# Patient Record
Sex: Female | Born: 1950 | Race: White | Hispanic: No | Marital: Single | Smoking: Never smoker
Health system: Southern US, Community
[De-identification: ages and names within clinical notes are randomized; demographics above are authoritative.]

## PROBLEM LIST (undated history)

## (undated) DIAGNOSIS — I4891 Unspecified atrial fibrillation: Secondary | ICD-10-CM

## (undated) DIAGNOSIS — M1990 Unspecified osteoarthritis, unspecified site: Secondary | ICD-10-CM

## (undated) DIAGNOSIS — R011 Cardiac murmur, unspecified: Secondary | ICD-10-CM

## (undated) DIAGNOSIS — K219 Gastro-esophageal reflux disease without esophagitis: Secondary | ICD-10-CM

## (undated) DIAGNOSIS — I10 Essential (primary) hypertension: Secondary | ICD-10-CM

## (undated) DIAGNOSIS — M7989 Other specified soft tissue disorders: Secondary | ICD-10-CM

## (undated) DIAGNOSIS — F419 Anxiety disorder, unspecified: Secondary | ICD-10-CM

## (undated) DIAGNOSIS — F32A Depression, unspecified: Secondary | ICD-10-CM

## (undated) DIAGNOSIS — F329 Major depressive disorder, single episode, unspecified: Secondary | ICD-10-CM

## (undated) DIAGNOSIS — E875 Hyperkalemia: Secondary | ICD-10-CM

## (undated) DIAGNOSIS — R112 Nausea with vomiting, unspecified: Secondary | ICD-10-CM

## (undated) DIAGNOSIS — N289 Disorder of kidney and ureter, unspecified: Secondary | ICD-10-CM

## (undated) DIAGNOSIS — C541 Malignant neoplasm of endometrium: Secondary | ICD-10-CM

## (undated) DIAGNOSIS — Z8719 Personal history of other diseases of the digestive system: Secondary | ICD-10-CM

## (undated) DIAGNOSIS — C9110 Chronic lymphocytic leukemia of B-cell type not having achieved remission: Secondary | ICD-10-CM

## (undated) DIAGNOSIS — M7730 Calcaneal spur, unspecified foot: Secondary | ICD-10-CM

## (undated) DIAGNOSIS — Z9889 Other specified postprocedural states: Secondary | ICD-10-CM

## (undated) DIAGNOSIS — E785 Hyperlipidemia, unspecified: Secondary | ICD-10-CM

## (undated) DIAGNOSIS — S92902A Unspecified fracture of left foot, initial encounter for closed fracture: Secondary | ICD-10-CM

## (undated) DIAGNOSIS — Z86007 Personal history of in-situ neoplasm of skin: Secondary | ICD-10-CM

## (undated) HISTORY — DX: Cardiac murmur, unspecified: R01.1

## (undated) HISTORY — DX: Personal history of in-situ neoplasm of skin: Z86.007

## (undated) HISTORY — DX: Essential (primary) hypertension: I10

## (undated) HISTORY — DX: Gastro-esophageal reflux disease without esophagitis: K21.9

## (undated) HISTORY — DX: Calcaneal spur, unspecified foot: M77.30

## (undated) HISTORY — PX: OTHER SURGICAL HISTORY: SHX169

## (undated) HISTORY — DX: Major depressive disorder, single episode, unspecified: F32.9

## (undated) HISTORY — PX: HIP SURGERY: SHX245

## (undated) HISTORY — DX: Chronic lymphocytic leukemia of B-cell type not having achieved remission: C91.10

## (undated) HISTORY — DX: Unspecified atrial fibrillation: I48.91

## (undated) HISTORY — DX: Depression, unspecified: F32.A

## (undated) HISTORY — DX: Hyperlipidemia, unspecified: E78.5

## (undated) HISTORY — PX: EYE SURGERY: SHX253

## (undated) HISTORY — DX: Malignant neoplasm of endometrium: C54.1

## (undated) HISTORY — PX: LYMPH NODE BIOPSY: SHX201

## (undated) HISTORY — PX: COLONOSCOPY: SHX174

## (undated) HISTORY — PX: REFRACTIVE SURGERY: SHX103

---

## 1996-11-01 HISTORY — PX: ABDOMINAL HYSTERECTOMY: SHX81

## 1998-03-14 ENCOUNTER — Emergency Department (HOSPITAL_COMMUNITY): Admission: EM | Admit: 1998-03-14 | Discharge: 1998-03-14 | Payer: Self-pay | Admitting: Emergency Medicine

## 1999-02-02 ENCOUNTER — Ambulatory Visit (HOSPITAL_COMMUNITY): Admission: RE | Admit: 1999-02-02 | Discharge: 1999-02-02 | Payer: Self-pay | Admitting: Internal Medicine

## 1999-02-04 ENCOUNTER — Encounter: Admission: RE | Admit: 1999-02-04 | Discharge: 1999-02-04 | Payer: Self-pay | Admitting: Internal Medicine

## 1999-03-11 ENCOUNTER — Encounter: Admission: RE | Admit: 1999-03-11 | Discharge: 1999-03-11 | Payer: Self-pay | Admitting: Internal Medicine

## 1999-09-28 ENCOUNTER — Ambulatory Visit (HOSPITAL_COMMUNITY): Admission: RE | Admit: 1999-09-28 | Discharge: 1999-09-28 | Payer: Self-pay | Admitting: Gastroenterology

## 1999-12-22 ENCOUNTER — Encounter: Payer: Self-pay | Admitting: Obstetrics and Gynecology

## 1999-12-22 ENCOUNTER — Encounter: Admission: RE | Admit: 1999-12-22 | Discharge: 1999-12-22 | Payer: Self-pay | Admitting: Obstetrics and Gynecology

## 2000-05-16 ENCOUNTER — Encounter: Admission: RE | Admit: 2000-05-16 | Discharge: 2000-05-16 | Payer: Self-pay | Admitting: Internal Medicine

## 2000-11-14 ENCOUNTER — Encounter: Admission: RE | Admit: 2000-11-14 | Discharge: 2000-11-14 | Payer: Self-pay | Admitting: Internal Medicine

## 2000-12-23 ENCOUNTER — Encounter: Payer: Self-pay | Admitting: Obstetrics and Gynecology

## 2000-12-23 ENCOUNTER — Encounter: Admission: RE | Admit: 2000-12-23 | Discharge: 2000-12-23 | Payer: Self-pay | Admitting: Obstetrics and Gynecology

## 2001-05-15 ENCOUNTER — Encounter: Admission: RE | Admit: 2001-05-15 | Discharge: 2001-05-15 | Payer: Self-pay | Admitting: Internal Medicine

## 2002-01-15 ENCOUNTER — Encounter: Admission: RE | Admit: 2002-01-15 | Discharge: 2002-01-15 | Payer: Self-pay | Admitting: Obstetrics and Gynecology

## 2002-01-15 ENCOUNTER — Encounter: Payer: Self-pay | Admitting: Obstetrics and Gynecology

## 2003-01-28 ENCOUNTER — Encounter: Payer: Self-pay | Admitting: Obstetrics and Gynecology

## 2003-01-28 ENCOUNTER — Encounter: Admission: RE | Admit: 2003-01-28 | Discharge: 2003-01-28 | Payer: Self-pay | Admitting: Obstetrics and Gynecology

## 2003-03-05 ENCOUNTER — Encounter: Admission: RE | Admit: 2003-03-05 | Discharge: 2003-03-05 | Payer: Self-pay | Admitting: *Deleted

## 2004-01-31 ENCOUNTER — Encounter: Admission: RE | Admit: 2004-01-31 | Discharge: 2004-01-31 | Payer: Self-pay | Admitting: Obstetrics and Gynecology

## 2005-02-17 ENCOUNTER — Encounter: Admission: RE | Admit: 2005-02-17 | Discharge: 2005-02-17 | Payer: Self-pay | Admitting: Obstetrics and Gynecology

## 2006-02-21 ENCOUNTER — Encounter: Admission: RE | Admit: 2006-02-21 | Discharge: 2006-02-21 | Payer: Self-pay | Admitting: Family Medicine

## 2007-03-16 ENCOUNTER — Encounter: Admission: RE | Admit: 2007-03-16 | Discharge: 2007-03-16 | Payer: Self-pay | Admitting: Family Medicine

## 2008-04-05 ENCOUNTER — Encounter: Admission: RE | Admit: 2008-04-05 | Discharge: 2008-04-05 | Payer: Self-pay | Admitting: Family Medicine

## 2009-04-21 ENCOUNTER — Encounter: Admission: RE | Admit: 2009-04-21 | Discharge: 2009-04-21 | Payer: Self-pay | Admitting: Family Medicine

## 2009-07-21 ENCOUNTER — Encounter: Admission: RE | Admit: 2009-07-21 | Discharge: 2009-07-21 | Payer: Self-pay | Admitting: Family Medicine

## 2009-08-09 ENCOUNTER — Ambulatory Visit (HOSPITAL_COMMUNITY): Admission: RE | Admit: 2009-08-09 | Discharge: 2009-08-09 | Payer: Self-pay | Admitting: Orthopedic Surgery

## 2009-08-27 ENCOUNTER — Emergency Department (HOSPITAL_COMMUNITY): Admission: EM | Admit: 2009-08-27 | Discharge: 2009-08-27 | Payer: Self-pay | Admitting: Family Medicine

## 2009-09-01 ENCOUNTER — Ambulatory Visit (HOSPITAL_COMMUNITY): Admission: RE | Admit: 2009-09-01 | Discharge: 2009-09-01 | Payer: Self-pay | Admitting: Orthopedic Surgery

## 2009-10-15 ENCOUNTER — Encounter (INDEPENDENT_AMBULATORY_CARE_PROVIDER_SITE_OTHER): Payer: Self-pay | Admitting: *Deleted

## 2009-10-28 ENCOUNTER — Encounter (INDEPENDENT_AMBULATORY_CARE_PROVIDER_SITE_OTHER): Payer: Self-pay | Admitting: *Deleted

## 2009-10-29 ENCOUNTER — Ambulatory Visit: Payer: Self-pay | Admitting: Gastroenterology

## 2009-10-29 ENCOUNTER — Encounter (INDEPENDENT_AMBULATORY_CARE_PROVIDER_SITE_OTHER): Payer: Self-pay | Admitting: *Deleted

## 2009-11-21 ENCOUNTER — Encounter: Admission: RE | Admit: 2009-11-21 | Discharge: 2009-11-21 | Payer: Self-pay | Admitting: Family Medicine

## 2009-11-25 ENCOUNTER — Ambulatory Visit: Payer: Self-pay | Admitting: Gastroenterology

## 2010-04-09 LAB — HM MAMMOGRAPHY

## 2010-04-23 ENCOUNTER — Encounter: Admission: RE | Admit: 2010-04-23 | Discharge: 2010-04-23 | Payer: Self-pay | Admitting: Family Medicine

## 2010-10-05 ENCOUNTER — Encounter: Payer: Self-pay | Admitting: Family Medicine

## 2010-10-05 LAB — CONVERTED CEMR LAB: HDL: 48 mg/dL

## 2010-10-29 ENCOUNTER — Encounter: Payer: Self-pay | Admitting: Family Medicine

## 2010-11-06 ENCOUNTER — Ambulatory Visit
Admission: RE | Admit: 2010-11-06 | Discharge: 2010-11-06 | Payer: Self-pay | Source: Home / Self Care | Attending: Family Medicine | Admitting: Family Medicine

## 2010-11-06 DIAGNOSIS — I10 Essential (primary) hypertension: Secondary | ICD-10-CM | POA: Insufficient documentation

## 2010-11-06 DIAGNOSIS — E782 Mixed hyperlipidemia: Secondary | ICD-10-CM | POA: Insufficient documentation

## 2010-11-06 DIAGNOSIS — F418 Other specified anxiety disorders: Secondary | ICD-10-CM | POA: Insufficient documentation

## 2010-11-06 DIAGNOSIS — E1169 Type 2 diabetes mellitus with other specified complication: Secondary | ICD-10-CM | POA: Insufficient documentation

## 2010-11-06 DIAGNOSIS — F329 Major depressive disorder, single episode, unspecified: Secondary | ICD-10-CM | POA: Insufficient documentation

## 2010-11-06 DIAGNOSIS — K219 Gastro-esophageal reflux disease without esophagitis: Secondary | ICD-10-CM | POA: Insufficient documentation

## 2010-12-03 NOTE — Procedures (Signed)
Summary: Colonoscopy  Patient: Brenda [name] Note: All result statuses are Final unless otherwise noted.  Tests: (1) Colonoscopy (COL)   COL Colonoscopy           [facility].     [place],   [address]           COLONOSCOPY PROCEDURE REPORT           PATIENT:  Brenda [name], Service  MR#:  [id number]     BIRTHDATE:  1951/03/25, 58 yrs. old  GENDER:  female           ENDOSCOPIST:  Sandy Salaam. Deatra Ina, MD     Referred by:           PROCEDURE DATE:  11/25/2009     PROCEDURE:  Colonoscopy, Diagnostic     ASA CLASS:  Class I     INDICATIONS:  Routine Risk Screening           MEDICATIONS:   Fentanyl 50 mcg IV, Versed 7 mg IV           DESCRIPTION OF PROCEDURE:   After the risks benefits and     alternatives of the procedure were thoroughly explained, informed     consent was obtained.  Digital rectal exam was performed and     revealed no abnormalities.   The LB [id number] endoscope     was introduced through the anus and advanced to the cecum, which     was identified by both the appendix and ileocecal valve, without     limitations.  The quality of the prep was excellent, using     MoviPrep.  The instrument was then slowly withdrawn as the colon     was fully examined.     <<PROCEDUREIMAGES>>           FINDINGS:  A normal appearing cecum, ileocecal valve, and     appendiceal orifice were identified. The ascending, hepatic     flexure, transverse, splenic flexure, descending, sigmoid colon,     and rectum appeared unremarkable (see image1, image2, image3,     image5, image6, image7, image9, image10, image12, and image13).     Retroflexed views in the rectum revealed no abnormalities.    The     scope was then withdrawn from the patient and the procedure     completed.           COMPLICATIONS:  None           ENDOSCOPIC IMPRESSION:     1) Normal colon     RECOMMENDATIONS:     1) Continue current colorectal screening recommendations  for     "routine risk" patients with a repeat colonoscopy in 10 years.           REPEAT EXAM:  In 10 year(s) for Colonoscopy.           ______________________________     Sandy Salaam. Deatra Ina, MD           CC:  Esperanza Richters, MD           n.     Lorrin Mais:   Sandy Salaam. Kaplan at 11/25/2009 09:04 AM           Ardine Eng, [id number]  Note: An exclamation mark (!) indicates a result that was not dispersed into the flowsheet. Document Creation Date: 11/25/2009 9:04 AM _______________________________________________________________________  (1) Order result status: Final Collection or  observation date-time: 11/25/2009 08:58 Requested date-time:  Receipt date-time:  Reported date-time:  Referring Physician:   Ordering Physician: Erskine Emery [phone]) Specimen Source:  Source: Brenda [name] Order Number: [id number] Lab site:   Appended Document: Colonoscopy    Clinical Lists Changes  Observations: Added new observation of COLONNXTDUE: 11/2019 (11/25/2009 12:53)

## 2010-12-03 NOTE — Assessment & Plan Note (Signed)
Summary: new patient (new patient packet mailed)/alc   Vital Signs:  Patient profile:   60 year old female Height:      61 inches Weight:      202.75 pounds BMI:     38.45 Temp:     98.4 degrees F oral Pulse rate:   90 / minute Pulse rhythm:   regular BP sitting:   110 / 72  (right arm) Cuff size:   large  Vitals Entered By: Sherrian Divers CMA Deborra Medina) (November 06, 2010 2:06 PM) CC: new patient, establish care   History of Present Illness: 60 [name] here to establish care. Pt of Dr. Tawni Millers, practice is closing.  DM- diagnosed in 2008, on Janumet 50/1000- 1 tab by mouth two times a day and Glipizide 2.5 mg daily.  Checks CBGs two times a day, 116-180.  Last a1c was 6.2.  Does have occassional episodes of hypoglycemia when she does not eat for long periods of time.  H/o endometrial CA in 1998- s/p hysterectomy.  No further tx necessary.  Depression- has been on Zoloft 200 mg for years. Admits to feeling more depressed lately- more anhedonia, more tearful.   Brother is her last living relative and he has been having health problems.  No SI or HI. Never been on any other antidepressants.  Preventive Screening-Counseling & Management  Alcohol-Tobacco     Smoking Status: never  Current Medications (verified): 1)  Clonazepam 0.5 Mg Tabs (Clonazepam) .... Take One Tablet By Mouth Three Times A Day 2)  Amlodipine Besy-Benazepril Hcl 10-20 Mg Caps (Amlodipine Besy-Benazepril Hcl) .... Take One Tablet By Mouth Daily 3)  Sertraline Hcl 100 Mg Tabs (Sertraline Hcl) .Marland Kitchen.. 1 Tab By Mouth Daily. 4)  Janumet 50-1000 Mg Tabs (Sitagliptin-Metformin Hcl) .... Take One Tablet By Mouth Two Times A Day 5)  Vytorin 10-40 Mg Tabs (Ezetimibe-Simvastatin) .... Take One Tablet By Mouth Daily 6)  Tramadol Hcl 50 Mg Tabs (Tramadol Hcl) .... Take One Tablet By Mouth As Needed 7)  Lovaza 1 Gm Caps (Omega-3-Acid Ethyl Esters) .... Take Two Tablets By Mouth Two Times A Day 8)  Prevacid 15 Mg Cpdr (Lansoprazole)  .... Take One Tablet By Mouth Daily 9)  Calcium 500 Mg Tabs (Calcium) .... Take One Tablet By Mouth Two Times A Day 10)  Vitamin D 1000 Unit Tabs (Cholecalciferol) .... Take One Tablet By Mouth Daily 11)  Aspirin 81 Mg Tabs (Aspirin) .... Take One Tablet By Mouth Daily 12)  Glipizide 5 Mg Tabs (Glipizide) .... Take 1/2 Tablet Two Times A Day 13)  Hydrochlorothiazide 25 Mg Tabs (Hydrochlorothiazide) .... Take One Tablet By Mouth Daily 14)  Meloxicam 15 Mg Tabs (Meloxicam) .... Take 1/2 To One Tablet Once Daily As Needed 15)  Wellbutrin Xl 150 Mg Xr24h-Tab (Bupropion Hcl) .Marland Kitchen.. 1 Tab By Mouth Every Morning.  Allergies: 1)  ! 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 Beola Cord Heart murmur, benign- Dr. Rockey Situ.  Per pt, neg stress test and echo in 2007  Past Surgical History: Hysterectomy  Family History: Family History Breast cancer 1st degree relative <50,  mom died of breast CA at 62 [name] Dad died in his 62s- plasma sarcoma  Social History: Single Therapist, sports at [employer] Never Smoked Alcohol use-no Smoking Status:  never  Review of Systems      See HPI General:  Denies loss of appetite. Eyes:  Denies blurring. ENT:  Denies difficulty swallowing. CV:  Denies chest pain or discomfort. Resp:  Denies shortness of breath. GI:  Denies abdominal pain, nausea, and vomiting. GU:  Denies discharge and dysuria. MS:  Complains of joint pain; denies joint redness and joint swelling. Derm:  Denies rash. Neuro:  Denies headaches. Psych:   Complains of depression and easily tearful; denies suicidal thoughts/plans, thoughts of violence, unusual visions or sounds, and thoughts /plans of harming others. Endo:  Denies cold intolerance and heat intolerance. Heme:  Denies abnormal bruising and bleeding.  Physical Exam  General:  alert and overweight-appearing.   Head:  normocephalic and no abnormalities observed.   Eyes:  vision grossly intact, pupils equal, and pupils round.   Ears:  R ear normal and L ear normal.   Nose:  no external deformity.   Mouth:  good dentition and no gingival abnormalities.   Neck:  No deformities, masses, or tenderness noted. Lungs:  Normal respiratory effort, chest expands symmetrically. Lungs are clear to auscultation, no crackles or wheezes. Heart:  normal rate and grade  2/6 systolic murmur.   Abdomen:  Bowel sounds positive,abdomen soft and non-tender without masses, organomegaly or hernias noted. Msk:  No deformity or scoliosis noted of thoracic or lumbar spine.   Extremities:  no edema Neurologic:  alert & oriented X3 and gait normal.   Skin:  Intact without suspicious lesions or rashes Psych:  Cognition and judgment appear intact. Alert and cooperative with normal attention span and concentration. No apparent delusions, illusions, hallucinations   Impression & Recommendations:  Problem # 1:  DEPRESSION (ICD-311) Assessment Deteriorated Discussed different tx options with pt. We agreed to decrease her Zoloft to 100 mg daily and add Wellbutrin 150 mg daily.  Pt to follow up in one month.   Her updated medication list for this problem includes:    Clonazepam 0.5 Mg Tabs (Clonazepam) .Marland Kitchen... Take one tablet by mouth three times a day    Sertraline Hcl 100 Mg Tabs (Sertraline hcl) .Marland Kitchen... 1 tab by mouth daily.    Wellbutrin Xl 150 Mg Xr24h-tab (Bupropion hcl) .Marland Kitchen... 1 tab by mouth every morning.  Problem # 2:  HYPERTENSION (ICD-401.9) Assessment: Unchanged Stable on current meds, awaiting  records.   Her updated medication list for this problem includes:    Amlodipine Besy-benazepril Hcl 10-20 Mg Caps (Amlodipine besy-benazepril hcl) .Marland Kitchen... Take one tablet by mouth daily    Hydrochlorothiazide 25 Mg Tabs (Hydrochlorothiazide) .Marland Kitchen... Take one tablet by mouth daily  Problem # 3:  DIABETES MELLITUS, TYPE II (ICD-250.00) Assessment: Unchanged Awaiting records.  May be able to decrease meds.   Her updated medication list for this problem includes:    Amlodipine Besy-benazepril Hcl 10-20 Mg Caps (Amlodipine besy-benazepril hcl) .Marland Kitchen... Take one tablet by mouth daily    Janumet 50-1000 Mg Tabs (Sitagliptin-metformin hcl) .Marland Kitchen... Take one tablet by mouth two times a day    Aspirin 81 Mg Tabs (Aspirin) .Marland Kitchen... Take one tablet by mouth daily    Glipizide 5 Mg Tabs (Glipizide) .Marland Kitchen... Take 1/2 tablet two times a day  Problem # 4:  HYPERLIPIDEMIA (ICD-272.4) Assessment: Unchanged awaiting records.  denies any side effects from meds.  Vytorin 10-40 Mg Tabs (Ezetimibe-simvastatin) .Marland Kitchen... Take one tablet by mouth daily    Lovaza 1 Gm Caps (Omega-3-acid ethyl esters) .Marland Kitchen... Take two tablets by mouth two times a day  Complete Medication List: 1)  Clonazepam 0.5 Mg Tabs (Clonazepam) .... Take one tablet by mouth three times a day 2)  Amlodipine Besy-benazepril Hcl 10-20 Mg Caps (Amlodipine besy-benazepril hcl) .... Take one tablet by mouth daily 3)  Sertraline Hcl 100 Mg Tabs (Sertraline hcl) .Marland Kitchen.. 1 tab by mouth daily. 4)  Janumet 50-1000 Mg Tabs (Sitagliptin-metformin hcl) .... Take one tablet by mouth two times a day 5)  Vytorin 10-40 Mg Tabs (Ezetimibe-simvastatin) .... Take one tablet by mouth daily 6)  Tramadol Hcl 50 Mg Tabs (Tramadol hcl) .... Take one tablet by mouth as needed 7)  Lovaza 1 Gm Caps (Omega-3-acid ethyl esters) .... Take two tablets by mouth two times a day 8)  Prevacid 15 Mg Cpdr (Lansoprazole) .... Take one tablet by mouth daily 9)  Calcium 500 Mg Tabs (Calcium) .... Take  one tablet by mouth two times a day 10)  Vitamin D 1000 Unit Tabs (Cholecalciferol) .... Take one tablet by mouth daily 11)  Aspirin 81 Mg Tabs (Aspirin) .... Take one tablet by mouth daily 12)  Glipizide 5 Mg Tabs (Glipizide) .... Take 1/2 tablet two times a day 13)  Hydrochlorothiazide 25 Mg Tabs (Hydrochlorothiazide) .... Take one tablet by mouth daily 14)  Meloxicam 15 Mg Tabs (Meloxicam) .... Take 1/2 to one tablet once daily as needed 15)  Wellbutrin Xl 150 Mg Xr24h-tab (Bupropion hcl) .Marland Kitchen.. 1 tab by mouth every morning.  Patient Instructions: 1)  Great to meet you. 2)  Let's add Wellbutrin 150 mg every morning to your Zoloft (decrease the Zoloft to 1 tab by mouth daily). 3)  Make an appointment for a complete physical in one month. Prescriptions: SERTRALINE HCL 100 MG TABS (SERTRALINE HCL) 1 tab by mouth daily.  #30 x 1   Entered and Authorized by:   Arnette Norris MD   Signed by:   Arnette Norris MD on 11/06/2010   Method used:   Electronically to        [facility] (retail)       [address], [place]  [address]       Ph: [phone]       Fax: [phone]   RxID:   [id number] WELLBUTRIN XL 150 MG XR24H-TAB (BUPROPION HCL) 1 tab by mouth every morning.  #30 x 0   Entered and Authorized by:   Arnette Norris MD   Signed by:   Arnette Norris MD on 11/06/2010   Method used:   Electronically to        [facility] (retail)       [address]       [place], [place]  [address]       Ph: [phone]       Fax: [phone]   RxID:   [id number]    Orders Added: 1)  New Patient Level III [18563]    Current Allergies (reviewed today): ! BETA BLOCKERS  TD Result Date:  11/12/2009 TD Result:  historical Flex Sig Next Due:  Not Indicated Hemoccult Next Due:  Not Indicated PAP Result Date:  10/15/2009 PAP Result:  historical normal Mammogram Result  Date:  04/09/2010 Mammogram Result:  historical normal

## 2010-12-07 ENCOUNTER — Telehealth: Payer: Self-pay | Admitting: Family Medicine

## 2010-12-14 ENCOUNTER — Encounter: Payer: Self-pay | Admitting: Family Medicine

## 2010-12-14 ENCOUNTER — Encounter (INDEPENDENT_AMBULATORY_CARE_PROVIDER_SITE_OTHER): Payer: Commercial Managed Care - PPO | Admitting: Family Medicine

## 2010-12-14 DIAGNOSIS — E119 Type 2 diabetes mellitus without complications: Secondary | ICD-10-CM

## 2010-12-14 DIAGNOSIS — Z23 Encounter for immunization: Secondary | ICD-10-CM

## 2010-12-14 DIAGNOSIS — Z0000 Encounter for general adult medical examination without abnormal findings: Secondary | ICD-10-CM

## 2010-12-14 DIAGNOSIS — F329 Major depressive disorder, single episode, unspecified: Secondary | ICD-10-CM

## 2010-12-14 DIAGNOSIS — E785 Hyperlipidemia, unspecified: Secondary | ICD-10-CM

## 2010-12-17 NOTE — Progress Notes (Signed)
Summary: Bupropion XL  Phone Note Refill Request Message from:  Fax from Pharmacy on December 07, 2010 12:09 PM  Refills Requested: Medication #1:  WELLBUTRIN XL 150 MG XR24H-TAB 1 tab by mouth every morning.. This was only prescribed for #30 /0RF at NP visit in January, not sure if you wanted it RF'd.  [facility]   Method Requested: Electronic Initial call taken by: Christena Deem CMA Deborra Medina),  December 07, 2010 12:10 PM    Prescriptions: WELLBUTRIN XL 150 MG XR24H-TAB (BUPROPION HCL) 1 tab by mouth every morning.  #30 x 3   Entered and Authorized by:   Arnette Norris MD   Signed by:   Arnette Norris MD on 12/07/2010   Method used:   Electronically to        [facility] (retail)       [address], [place]  [address]       Ph: [phone]       Fax: [phone]   RxID:   [id number]

## 2010-12-23 NOTE — Assessment & Plan Note (Signed)
Summary: cpe [facility]   Vital Signs:  Patient profile:   60 year old female Height:      61 inches Weight:      209 pounds BMI:     39.63 Temp:     98.4 degrees F oral Pulse rate:   86 / minute Pulse rhythm:   regular BP sitting:   122 / 72  (left arm) Cuff size:   large  Vitals Entered By: Sherrian Divers CMA Deborra Medina) (December 14, 2010 8:26 AM) CC: complete physicial no pap   History of Present Illness: 60 yo here for CPX.     DM- diagnosed in 2008, on Janumet 50/1000- 1 tab by mouth two times a day and Glipizide 2.5 mg daily.  Checks CBGs two times a day, 116-180.  Last a1c was 6.1 in December.  Does have occassional episodes of hypoglycemia when she does not eat for long periods of time.  Well woman H/o endometrial CA in 1998- s/p hysterectomy/oophrectomy.  No further tx necessary. UTD on colonoscopy, mammogram. Due for Zostavax.  Depression- has been on Zoloft 200 mg for years. Last montoh, admitted to feeling more depressed lately- more anhedonia, more tearful.   Brother is her last living relative and he has been having health problems.  No SI or HI.  We added Wellbutrin 150 mg XL daily last month and decreased her Zoloft to 100 mg daily. She does feel the Wellbutrin has helped with andhedonia, tearfulness. Would like to try to increase dose if possible.     Current Medications (verified): 1)  Clonazepam 0.5 Mg Tabs (Clonazepam) .... Take One Tablet By Mouth Three Times A Day 2)  Amlodipine Besy-Benazepril Hcl 10-20 Mg Caps (Amlodipine Besy-Benazepril Hcl) .... Take One Tablet By Mouth Daily 3)  Sertraline Hcl 100 Mg Tabs (Sertraline Hcl) .Marland Kitchen.. 1 Tab By Mouth Daily. 4)  Janumet 50-1000 Mg Tabs (Sitagliptin-Metformin Hcl) .... Take One Tablet By Mouth Two Times A Day 5)  Vytorin 10-40 Mg Tabs (Ezetimibe-Simvastatin) .... Take One Tablet By Mouth Daily 6)  Tramadol Hcl 50 Mg Tabs (Tramadol Hcl) .... Take One Tablet By Mouth As Needed 7)  Lovaza 1 Gm Caps (Omega-3-Acid Ethyl  Esters) .... Take Two Tablets By Mouth Two Times A Day 8)  Prevacid 15 Mg Cpdr (Lansoprazole) .... Take One Tablet By Mouth Daily 9)  Calcium 500 Mg Tabs (Calcium) .... Take One Tablet By Mouth Two Times A Day 10)  Vitamin D 1000 Unit Tabs (Cholecalciferol) .... Take One Tablet By Mouth Daily 11)  Aspirin 81 Mg Tabs (Aspirin) .... Take One Tablet By Mouth Daily 12)  Glipizide 5 Mg Tabs (Glipizide) .... Take 1/2 Tablet Two Times A Day 13)  Hydrochlorothiazide 25 Mg Tabs (Hydrochlorothiazide) .... Take One Tablet By Mouth Daily 14)  Meloxicam 15 Mg Tabs (Meloxicam) .... Take 1/2 To One Tablet Once Daily As Needed 15)  Wellbutrin Xl 300 Mg Xr24h-Tab (Bupropion Hcl) .Marland Kitchen.. 1 Tab By Mouth Daily.  Allergies: 1)  ! Beta Blockers  Past History:  Past Medical History: Last updated: 11/06/2010 Depression Diabetes mellitus, type II GERD Hyperlipidemia Hypertension Endometrial CA s/p hysterectomy 1998 Left Heel spur- Dr. Beola Cord Heart murmur, benign- Dr. Rockey Situ.  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 Eyes:  Denies blurring. ENT:  Denies difficulty swallowing. CV:  Denies chest pain or discomfort. Resp:  Denies shortness of breath. GI:  Denies abdominal pain, bloody stools, and change in bowel habits. GU:  Denies dysuria. MS:  Denies joint pain, joint redness, and joint swelling. Derm:  Denies rash. Neuro:  Denies headaches. Psych:  Denies sense of great danger, suicidal thoughts/plans, thoughts of violence, unusual visions or sounds, and thoughts  /plans of harming others. Endo:  Denies cold intolerance and heat intolerance. Heme:  Denies abnormal bruising and bleeding.  Physical Exam  General:  alert and overweight-appearing.   Head:  normocephalic and no abnormalities observed.   Eyes:  vision grossly intact, pupils equal, pupils round, and pupils reactive to light.   Ears:  R ear normal and L ear normal.   Nose:  no external deformity.   Mouth:  good dentition.   Neck:  No deformities, masses, or tenderness noted. Breasts:  No mass, nodules, thickening, tenderness, bulging, retraction, inflamation, nipple discharge or skin changes noted.   Lungs:  Normal respiratory effort, chest expands symmetrically. Lungs are clear to auscultation, no crackles or wheezes. Heart:  normal rate and grade  2/6 systolic murmur.   Abdomen:  Bowel sounds positive,abdomen soft and non-tender without masses, organomegaly or hernias noted. Rectal:  deferred Genitalia:  deferred Msk:  No deformity or scoliosis noted of thoracic or lumbar spine.   Extremities:  no edema Neurologic:  alert & oriented X3 and gait normal.   Skin:  Intact without suspicious lesions or rashes Psych:  Cognition and judgment appear intact. Alert and cooperative with normal attention span and concentration. No apparent delusions, illusions, hallucinations   Impression & Recommendations:  Problem # 1:  Preventive Health Care (ICD-V70.0) Reviewed preventive care protocols, scheduled due services, and updated immunizations Discussed nutrition, exercise, diet, and healthy lifestyle.  Zostavax today.  Problem # 2:  HYPERLIPIDEMIA (RJJ-884.4) Assessment: Unchanged stable, repeat labs in 10/2011. Her updated medication list for this problem includes:    Vytorin 10-40 Mg Tabs (Ezetimibe-simvastatin) .Marland Kitchen... Take one tablet by mouth daily    Lovaza 1 Gm Caps (Omega-3-acid ethyl esters) .Marland Kitchen... Take two tablets by mouth two times a day  Problem # 3:  DIABETES MELLITUS, TYPE II  (ICD-250.00) Assessment: Unchanged stable, repeat a1c in 3-6 months. Her updated medication list for this problem includes:    Amlodipine Besy-benazepril Hcl 10-20 Mg Caps (Amlodipine besy-benazepril hcl) .Marland Kitchen... Take one tablet by mouth daily    Janumet 50-1000 Mg Tabs (Sitagliptin-metformin hcl) .Marland Kitchen... Take one tablet by mouth two times a day    Aspirin 81 Mg Tabs (Aspirin) .Marland Kitchen... Take one tablet by mouth daily    Glipizide 5 Mg Tabs (Glipizide) .Marland Kitchen... Take 1/2 tablet two times a day  Problem # 4:  DEPRESSION (ICD-311) Assessment: Improved Increase Wellbutrin to 300 mg daily, continue Zoloft 100 mg daily, as needed Klonipin. Her updated medication list for this problem includes:    Clonazepam 0.5 Mg Tabs (Clonazepam) .Marland Kitchen... Take one tablet by mouth three times a day    Sertraline Hcl 100 Mg Tabs (Sertraline hcl) .Marland Kitchen... 1 tab by mouth daily.    Wellbutrin Xl 300 Mg Xr24h-tab (Bupropion hcl) .Marland Kitchen... 1 tab by mouth daily.  Complete Medication List: 1)  Clonazepam 0.5 Mg Tabs (Clonazepam) .... Take  one tablet by mouth three times a day 2)  Amlodipine Besy-benazepril Hcl 10-20 Mg Caps (Amlodipine besy-benazepril hcl) .... Take one tablet by mouth daily 3)  Sertraline Hcl 100 Mg Tabs (Sertraline hcl) .Marland Kitchen.. 1 tab by mouth daily. 4)  Janumet 50-1000 Mg Tabs (Sitagliptin-metformin hcl) .... Take one tablet by mouth two times a day 5)  Vytorin 10-40 Mg Tabs (Ezetimibe-simvastatin) .... Take one tablet by mouth daily 6)  Tramadol Hcl 50 Mg Tabs (Tramadol hcl) .... Take one tablet by mouth as needed 7)  Lovaza 1 Gm Caps (Omega-3-acid ethyl esters) .... Take two tablets by mouth two times a day 8)  Prevacid 15 Mg Cpdr (Lansoprazole) .... Take one tablet by mouth daily 9)  Calcium 500 Mg Tabs (Calcium) .... Take one tablet by mouth two times a day 10)  Vitamin D 1000 Unit Tabs (Cholecalciferol) .... Take one tablet by mouth daily 11)  Aspirin 81 Mg Tabs (Aspirin) .... Take one tablet by mouth daily 12)   Glipizide 5 Mg Tabs (Glipizide) .... Take 1/2 tablet two times a day 13)  Hydrochlorothiazide 25 Mg Tabs (Hydrochlorothiazide) .... Take one tablet by mouth daily 14)  Meloxicam 15 Mg Tabs (Meloxicam) .... Take 1/2 to one tablet once daily as needed 15)  Wellbutrin Xl 300 Mg Xr24h-tab (Bupropion hcl) .Marland Kitchen.. 1 tab by mouth daily.  Other Orders: Zoster (Shingles) Vaccine Live [id number]) Admin 1st Vaccine [id number])  Patient Instructions: 1)  Great to see you. 2)  Please make a follow up lab visit in 3-6 months- a1c, BMET (250.00), fastining lipid panel (272.4), hepatic panel. Prescriptions: WELLBUTRIN XL 300 MG XR24H-TAB (BUPROPION HCL) 1 tab by mouth daily.  #90 x 3   Entered and Authorized by:   Arnette Norris MD   Signed by:   Arnette Norris MD on 12/14/2010   Method used:   Electronically to        [facility] (retail)       [address]       [place], [place]  [address]       Ph: [phone]       Fax: [phone]   RxID:   [id number]    Orders Added: 1)  Zoster (Shingles) Vaccine Live [55732] 2)  Admin 1st Vaccine [20254] 3)  Est. Patient 40-64 years [99396] 4)  Est. Patient Level III [99213]   Immunizations Administered:  Zostavax # 1:    Vaccine Type: Zostavax    Site: left deltoid    Mfr: Merck    Dose: 0.47m    Route: [place]    Given by: NSherrian DiversCMA ([place]    Exp. Date: 06/19/2011    Lot #: [id number]   VIS given: 08/13/05 given December 14, 2010.   Immunizations Administered:  Zostavax # 1:    Vaccine Type: Zostavax    Site: left deltoid    Mfr: Merck    Dose: 0.66m   Route: [place]    Given by: NiSherrian DiversMA (AANew [place]   Exp. Date: 06/19/2011    Lot #: [id number]  VIS given: 08/13/05 given December 14, 2010.  Current Allergies (reviewed today): ! BETA BLOCKERS  Flu Vaccine Next Due:  Not Indicated Herpes Zoster Result Date:  12/14/2010 Herpes Zoster Result:  given HDL Result Date:  10/05/2010 HDL  Result:  48 LDL Result Date:  10/05/2010 LDL Result:  80 Last PAP:  historical normal (  10/15/2009 4:17:16 PM) PAP Next Due:  Not Indicated

## 2010-12-23 NOTE — Letter (Signed)
Summary: [facility] @ [place]   Imported By: Jamelle Haring 12/18/2010 10:05:11  _____________________________________________________________________  External Attachment:    Type:   Image     Comment:   External Document

## 2010-12-24 ENCOUNTER — Other Ambulatory Visit (HOSPITAL_COMMUNITY): Payer: Self-pay | Admitting: Orthopedic Surgery

## 2010-12-24 DIAGNOSIS — M25562 Pain in left knee: Secondary | ICD-10-CM

## 2010-12-31 HISTORY — PX: OTHER SURGICAL HISTORY: SHX169

## 2011-01-04 ENCOUNTER — Ambulatory Visit (HOSPITAL_COMMUNITY)
Admission: RE | Admit: 2011-01-04 | Discharge: 2011-01-04 | Disposition: A | Discharge status: Home / Self Care | Payer: Commercial Managed Care - PPO | Source: Ambulatory Visit | Attending: Orthopedic Surgery | Admitting: Orthopedic Surgery

## 2011-01-04 ENCOUNTER — Encounter: Payer: Self-pay | Admitting: Family Medicine

## 2011-01-04 DIAGNOSIS — M25562 Pain in left knee: Secondary | ICD-10-CM

## 2011-01-04 DIAGNOSIS — S83289A Other tear of lateral meniscus, current injury, unspecified knee, initial encounter: Secondary | ICD-10-CM | POA: Insufficient documentation

## 2011-01-04 DIAGNOSIS — M1710 Unilateral primary osteoarthritis, unspecified knee: Secondary | ICD-10-CM | POA: Insufficient documentation

## 2011-01-04 DIAGNOSIS — IMO0002 Reserved for concepts with insufficient information to code with codable children: Secondary | ICD-10-CM | POA: Insufficient documentation

## 2011-01-04 DIAGNOSIS — M25569 Pain in unspecified knee: Secondary | ICD-10-CM | POA: Insufficient documentation

## 2011-01-04 DIAGNOSIS — X58XXXA Exposure to other specified factors, initial encounter: Secondary | ICD-10-CM | POA: Insufficient documentation

## 2011-01-11 ENCOUNTER — Encounter: Payer: Self-pay | Admitting: Family Medicine

## 2011-01-11 ENCOUNTER — Ambulatory Visit (INDEPENDENT_AMBULATORY_CARE_PROVIDER_SITE_OTHER): Payer: Commercial Managed Care - PPO | Admitting: Family Medicine

## 2011-01-11 DIAGNOSIS — Z01818 Encounter for other preprocedural examination: Secondary | ICD-10-CM

## 2011-01-12 ENCOUNTER — Telehealth: Payer: Self-pay | Admitting: Family Medicine

## 2011-01-17 LAB — GLUCOSE, CAPILLARY: Glucose-Capillary: 150 mg/dL — ABNORMAL HIGH (ref 70–99)

## 2011-01-19 NOTE — Progress Notes (Signed)
Summary: regarding surgical clearance  Phone Note Call from Patient   Caller: Patient Summary of Call: Pt was seen yesterday to get cleared for knee surgery.  She says the approval letter and whatever information is needed needs to be faxed to Madison Hickman, the surgery schedular at ortho's office, her fax is [phone].  She says it was faxed to the wrong number yesterday.         Marty Heck CMA, AAMA  January 12, 2011 11:26 AM   Follow-up for Phone Call        Pre-op note and EKG faxed to Santiago Glad at [phone] today. Follow-up by: Sherrian Divers CMA Deborra Medina),  January 12, 2011 11:32 AM

## 2011-01-19 NOTE — Assessment & Plan Note (Signed)
Summary: PRE-OP CLEARANCE FOR LEFT KNEE SURGERY BY DR NORRIS/CLE   [facility]   Vital Signs:  Patient profile:   59 year old female Height:      61 inches Weight:      210.25 pounds BMI:     39.87 Temp:     97.9 degrees F oral Pulse rate:   80 / minute Pulse rhythm:   regular BP sitting:   130 / 80  (left arm) Cuff size:   large  Vitals Entered By: Sherrian Divers CMA Deborra Medina) (January 11, 2011 10:29 AM) CC: pre-op clearance   History of Present Illness: 60 yo here for pre op clearance.  Has left meniscal tear requiring repair. Was stepping onto a curb and immediately felt a tear with swelling.  Pt is a non smoker. Has been under general anesthesia without any difficulties or complications in the past. No CP or SOB>  DM- diagnosed in 2008, on Janumet 50/1000- 1 tab by mouth two times a day and Glipizide 2.5 mg daily.  Checks CBGs two times a day, 116-180.  Last a1c was 6.1 in December.  Does have occassional episodes of hypoglycemia when she does not eat for long periods of time.  EKG- NSR.     Current Medications (verified): 1)  Clonazepam 0.5 Mg Tabs (Clonazepam) .... Take One Tablet By Mouth Three Times A Day 2)  Amlodipine Besy-Benazepril Hcl 10-20 Mg Caps (Amlodipine Besy-Benazepril Hcl) .... Take One Tablet By Mouth Daily 3)  Sertraline Hcl 100 Mg Tabs (Sertraline Hcl) .Marland Kitchen.. 1 Tab By Mouth Daily. 4)  Janumet 50-1000 Mg Tabs (Sitagliptin-Metformin Hcl) .... Take One Tablet By Mouth Two Times A Day 5)  Vytorin 10-40 Mg Tabs (Ezetimibe-Simvastatin) .... Take One Tablet By Mouth Daily 6)  Tramadol Hcl 50 Mg Tabs (Tramadol Hcl) .... Take One Tablet By Mouth As Needed 7)  Lovaza 1 Gm Caps (Omega-3-Acid Ethyl Esters) .... Take Two Tablets By Mouth Two Times A Day 8)  Prevacid 15 Mg Cpdr (Lansoprazole) .... Take One Tablet By Mouth Daily 9)  Calcium 500 Mg Tabs (Calcium) .... Take One Tablet By Mouth Two Times A Day 10)  Vitamin D 1000 Unit Tabs (Cholecalciferol) .... Take One Tablet By  Mouth Daily 11)  Aspirin 81 Mg Tabs (Aspirin) .... Take One Tablet By Mouth Daily 12)  Glipizide 5 Mg Tabs (Glipizide) .... Take 1/2 Tablet Two Times A Day 13)  Hydrochlorothiazide 25 Mg Tabs (Hydrochlorothiazide) .... Take One Tablet By Mouth Daily 14)  Meloxicam 15 Mg Tabs (Meloxicam) .... Take 1/2 To One Tablet Once Daily As Needed 15)  Wellbutrin Xl 300 Mg Xr24h-Tab (Bupropion Hcl) .Marland Kitchen.. 1 Tab By Mouth Daily.  Allergies: 1)  ! Beta Blockers  Past History:  Past Medical History: Last updated: 11/06/2010 Depression Diabetes mellitus, type II GERD Hyperlipidemia Hypertension Endometrial CA s/p hysterectomy 1998 Left Heel spur- Dr. Beola Cord Heart murmur, benign- Dr. Rockey Situ.  Per pt, neg stress test and echo in 2007  Past Surgical History: Last updated: 11/06/2010 Hysterectomy  Family History: Last updated: 11/06/2010 Family History Breast cancer 1st degree relative <50, mom died of breast CA at 79 yo Dad died in his 74s- plasma sarcoma  Social History: Last updated: 11/06/2010 Single RN at [employer] Short Stay Never Smoked Alcohol use-no  Risk Factors: Smoking Status: never (11/06/2010)  Review of Systems      See HPI General:  Denies malaise. Eyes:  Denies blurring. ENT:  Denies difficulty swallowing. CV:  Denies chest pain or discomfort. Resp:  Denies shortness of breath. GI:  Denies abdominal pain.  Physical Exam  General:  alert and overweight-appearing.   Head:  normocephalic and no abnormalities observed.   Eyes:  vision grossly intact, pupils equal, pupils round, and pupils reactive to light.   Nose:  no external deformity.   Mouth:  good dentition.   Lungs:  Normal respiratory effort, chest expands symmetrically. Lungs are clear to auscultation, no crackles or wheezes. Heart:  normal rate and grade  2/6 systolic murmur.   Msk:  Left knee- lateral joint tenderness with effusion Extremities:  no edema Neurologic:  alert & oriented X3 and gait normal.     Psych:  Cognition and judgment appear intact. Alert and cooperative with normal attention span and concentration. No apparent delusions, illusions, hallucinations   Impression & Recommendations:  Problem # 1:  UNSPECIFIED PRE-OPERATIVE EXAMINATION (ICD-V72.84) Assessment New EKG- normal sinus rhythm. Pt is cleared for surgery from my standpoint- no previous adverse reaction to general anesthesia. Non smoker, no CP or SOB.    Orders: EKG w/ Interpretation (93000)  Complete Medication List: 1)  Clonazepam 0.5 Mg Tabs (Clonazepam) .... Take one tablet by mouth three times a day 2)  Amlodipine Besy-benazepril Hcl 10-20 Mg Caps (Amlodipine besy-benazepril hcl) .... Take one tablet by mouth daily 3)  Sertraline Hcl 100 Mg Tabs (Sertraline hcl) .Marland Kitchen.. 1 tab by mouth daily. 4)  Janumet 50-1000 Mg Tabs (Sitagliptin-metformin hcl) .... Take one tablet by mouth two times a day 5)  Vytorin 10-40 Mg Tabs (Ezetimibe-simvastatin) .... Take one tablet by mouth daily 6)  Tramadol Hcl 50 Mg Tabs (Tramadol hcl) .... Take one tablet by mouth as needed 7)  Lovaza 1 Gm Caps (Omega-3-acid ethyl esters) .... Take two tablets by mouth two times a day 8)  Prevacid 15 Mg Cpdr (Lansoprazole) .... Take one tablet by mouth daily 9)  Calcium 500 Mg Tabs (Calcium) .... Take one tablet by mouth two times a day 10)  Vitamin D 1000 Unit Tabs (Cholecalciferol) .... Take one tablet by mouth daily 11)  Aspirin 81 Mg Tabs (Aspirin) .... Take one tablet by mouth daily 12)  Glipizide 5 Mg Tabs (Glipizide) .... Take 1/2 tablet two times a day 13)  Hydrochlorothiazide 25 Mg Tabs (Hydrochlorothiazide) .... Take one tablet by mouth daily 14)  Meloxicam 15 Mg Tabs (Meloxicam) .... Take 1/2 to one tablet once daily as needed 15)  Wellbutrin Xl 300 Mg Xr24h-tab (Bupropion hcl) .Marland Kitchen.. 1 tab by mouth daily.   Orders Added: 1)  EKG w/ Interpretation [93000] 2)  Est. Patient 40-64 years [21975]    Current Allergies (reviewed  today): ! BETA BLOCKERS  Appended Document: PRE-OP CLEARANCE FOR LEFT KNEE SURGERY BY DR NORRIS/CLE   [facility] OV note and EKG faxed to Dr. Veverly Fells at [phone].

## 2011-01-27 ENCOUNTER — Ambulatory Visit (HOSPITAL_BASED_OUTPATIENT_CLINIC_OR_DEPARTMENT_OTHER)
Admission: RE | Admit: 2011-01-27 | Discharge: 2011-01-27 | Disposition: A | Discharge status: Home / Self Care | Payer: Commercial Managed Care - PPO | Source: Ambulatory Visit | Attending: Orthopedic Surgery | Admitting: Orthopedic Surgery

## 2011-01-27 DIAGNOSIS — S83289A Other tear of lateral meniscus, current injury, unspecified knee, initial encounter: Secondary | ICD-10-CM | POA: Insufficient documentation

## 2011-01-27 DIAGNOSIS — X58XXXA Exposure to other specified factors, initial encounter: Secondary | ICD-10-CM | POA: Insufficient documentation

## 2011-01-27 DIAGNOSIS — IMO0002 Reserved for concepts with insufficient information to code with codable children: Secondary | ICD-10-CM | POA: Insufficient documentation

## 2011-01-27 DIAGNOSIS — M9420 Chondromalacia, unspecified site: Secondary | ICD-10-CM | POA: Insufficient documentation

## 2011-01-27 LAB — POCT I-STAT 4, (NA,K, GLUC, HGB,HCT)
Glucose, Bld: 103 mg/dL — ABNORMAL HIGH (ref 70–99)
Hemoglobin: 15.6 g/dL — ABNORMAL HIGH (ref 12.0–15.0)
Potassium: 3.7 mEq/L (ref 3.5–5.1)
Sodium: 141 mEq/L (ref 135–145)

## 2011-01-27 LAB — GLUCOSE, CAPILLARY: Glucose-Capillary: 87 mg/dL (ref 70–99)

## 2011-01-28 ENCOUNTER — Other Ambulatory Visit: Payer: Self-pay | Admitting: *Deleted

## 2011-01-28 MED ORDER — SITAGLIPTIN PHOS-METFORMIN HCL 50-1000 MG PO TABS: 1.0000 | ORAL_TABLET | Freq: Two times a day (BID) | ORAL | Status: DC

## 2011-02-03 NOTE — Op Note (Signed)
Brenda [name], Brenda [name]             ACCOUNT NO.:  [id number]  MEDICAL RECORD NO.:  [id number]           PATIENT TYPE:  O  LOCATION:  [facility]                         FACILITY:  [facility]  PHYSICIAN:  Doran Heater. Veverly Fells, M.D. DATE OF BIRTH:  Oct 19, 1951  DATE OF PROCEDURE:  01/27/2011 DATE OF DISCHARGE:  01/04/2011                              OPERATIVE REPORT   PREOPERATIVE DIAGNOSIS:  Left knee medial meniscus tear.  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  Medial plica shelf syndrome.  PROCEDURE PERFORMED:  Left knee arthroscopy with partial medial and lateral meniscectomies, abrasion chondroplasty not to bleeding bone in all three compartments.  Excision of medial plica.  SURGEON:  Doran Heater. Veverly Fells, M.D.  ASSISTANT:  None.  ANESTHESIA:  General anesthesia by laryngeal mask was used.  ESTIMATED BLOOD LOSS:  Minimal.  FLUID REPLACEMENT:  1200 cc crystalloid.  COMPLICATIONS:  None.  Perioperative antibiotics were given.  INDICATIONS:  The patient is a 60 year old female with worsening left medial knee pain secondary to a torn medial meniscus.  The patient had preoperative workup and MRI consistent with the same.  Clinically she has a torn meniscus with fairly well-preserved standing joint space but torn meniscus on MRI.  Having failed conservative consisting of modification of activities, injections, and anti-inflammatories, she presents for operative treatment for persistent pain and functional limitations.  Informed consent obtained.  DESCRIPTION OF PROCEDURE:  After an adequate level of anesthesia was achieved, the patient was positioned supine on the operating room table. The left leg correctly identified and placed in arthroscopic leg holder. Right leg placed in a well-leg holder.  After sterile prepping and draping  of the left knee, we entered the knee through standard portals including superolateral outflow, anterolateral scope and anteromedial working portals.  Pretty significant patellofemoral arthritis, grade 4 chondromalacia located on the patella.  I did not see a grade 4 lesion on the trochlea, but quite a bit of grade 3 change with some loose flaps and fibrillation of cartilage.  We removed some loose bodies and suctioned out through the shaver.  We also did a chondroplasty tangentially, smoothing off the loose flaps and fibrillation in the patellofemoral compartment.  Medial plica was noted, engaging the medial femoral condyle with some erosion there.  Performed a plica excision. We then entered the medial compartment.  There was a complex medial meniscus tear.  We performed a partial meniscectomy, removing the unstable portions of the meniscus.  This was about 40% to 50% of the medial meniscus that was removed.  The remaining meniscal tissue was probed and felt to be stable.  There was some grade 3 chondromalacia noted on the tibia and on the femur.  There was some pretty significant loose flaps and fibrillation again that we removed using a chondroplasty technique, not to bleeding bone.  A fairly large osteophyte present at the base of the ACL, which we debrided using the shaver as well to make sure it did not impinge in extension as it did prior  to excision of it. ACL, PCL intact.  The lateral compartment was very tight, but we were able to get into that compartment.  There was a degenerative meniscus tear in that compartment.  We performed a partial meniscectomy primarily anterior and middle horn using the motorized shaver and basket forceps back to a stable meniscal rim.  Posterior horn appeared to be intact. There was some grade 3 chondromalacia in the lateral compartment also debrided gently using the motorized shaver back to stable articular cartilage.  Lateral gutter was inspected.   No loose bodies noted.  We performed a final inspection of the entire knee joint and then concluded surgery, suturing the wounds with 4-0 Monocryl followed by Steri-Strips and a sterile dressing.  The patient tolerated the surgery well.     Doran Heater. Veverly Fells, M.D.     SRN/MEDQ  D:  01/27/2011  T:  01/27/2011  Job:  [id number]  Electronically Signed by Esmond Plants  on 02/03/2011 08:04:12 PM

## 2011-03-01 ENCOUNTER — Other Ambulatory Visit: Payer: Self-pay | Admitting: *Deleted

## 2011-03-02 MED ORDER — CLONAZEPAM 0.5 MG PO TABS: ORAL_TABLET | ORAL | Status: DC

## 2011-03-02 NOTE — Telephone Encounter (Signed)
Rx phoned to pharmacy.  

## 2011-03-19 NOTE — Procedures (Signed)
Brenda [name]. [facility]  Patient:    Brenda [name]                     MRN: [id number] Proc. Date: 09/28/99 Adm. Date:  [id number] Attending:  Ernie Avena CC:         Selinda Orion, M.D.                           Procedure Report  PROCEDURE:  Colonoscopy.  INDICATION:  Heme-positive stool and intermittent small-volume hematochezia.  FINDINGS:  Normal exam.  ENDOSCOPIST:  Cleotis Nipper, M.D.  INFORMED CONSENT:  The nature, purpose and risks of the procedure had been discussed with the patient, who provided written consent.  SEDATION:  Fentanyl 100 mcg and Versed 10 mg IV, without arrhythmias or desaturation.  DESCRIPTION OF PROCEDURE:  The Olympus adult video colonoscope was advanced easily to the cecum and for a short distance into a normal-appearing terminal ileum, after which pullback was initiated.  The quality of the prep was excellent and it was  felt that all areas were well-seen.  This was a normal exam.  No polyps, cancer, colitis, vascular malformations or diverticular disease were observed.  In retroflexion, the rectum was unremarkable. No biopsies were obtained.  The patient tolerated the procedure well and there ere no apparent complications.  Pullout through the anal canal did not demonstrate ny significant internal hemorrhoids.  IMPRESSION:  Normal colonoscopy.  Recent rectal bleeding was presumably from an  anal fissure.  PLAN:  Screening flexible sigmoidoscopy in five years. DD:  09/28/99 TD:  09/28/99 Job: [id number]

## 2011-03-30 ENCOUNTER — Other Ambulatory Visit: Payer: Self-pay | Admitting: *Deleted

## 2011-03-30 MED ORDER — TRAMADOL HCL 50 MG PO TABS: ORAL_TABLET | ORAL | Status: DC

## 2011-03-30 NOTE — Telephone Encounter (Signed)
Rx called to [facility].

## 2011-04-13 ENCOUNTER — Other Ambulatory Visit: Payer: Self-pay | Admitting: Family Medicine

## 2011-04-13 DIAGNOSIS — E785 Hyperlipidemia, unspecified: Secondary | ICD-10-CM

## 2011-04-15 ENCOUNTER — Other Ambulatory Visit (INDEPENDENT_AMBULATORY_CARE_PROVIDER_SITE_OTHER): Payer: 59 | Admitting: Family Medicine

## 2011-04-15 DIAGNOSIS — E785 Hyperlipidemia, unspecified: Secondary | ICD-10-CM

## 2011-04-15 DIAGNOSIS — E119 Type 2 diabetes mellitus without complications: Secondary | ICD-10-CM

## 2011-04-15 LAB — HEPATIC FUNCTION PANEL
ALT: 24 U/L (ref 0–35)
Alkaline Phosphatase: 85 U/L (ref 39–117)
Bilirubin, Direct: 0.2 mg/dL (ref 0.0–0.3)
Total Bilirubin: 0.9 mg/dL (ref 0.3–1.2)
Total Protein: 7 g/dL (ref 6.0–8.3)

## 2011-04-15 LAB — HEMOGLOBIN A1C: Hgb A1c MFr Bld: 6.5 % (ref 4.6–6.5)

## 2011-04-15 LAB — LIPID PANEL: Total CHOL/HDL Ratio: 4

## 2011-04-15 LAB — BASIC METABOLIC PANEL
CO2: 32 mEq/L (ref 19–32)
Creatinine, Ser: 0.9 mg/dL (ref 0.4–1.2)

## 2011-04-19 ENCOUNTER — Other Ambulatory Visit: Payer: Self-pay | Admitting: Family Medicine

## 2011-04-19 DIAGNOSIS — Z1231 Encounter for screening mammogram for malignant neoplasm of breast: Secondary | ICD-10-CM

## 2011-04-27 ENCOUNTER — Ambulatory Visit
Admission: RE | Admit: 2011-04-27 | Discharge: 2011-04-27 | Disposition: A | Discharge status: Home / Self Care | Payer: 59 | Source: Ambulatory Visit | Attending: Family Medicine | Admitting: Family Medicine

## 2011-04-27 DIAGNOSIS — Z1231 Encounter for screening mammogram for malignant neoplasm of breast: Secondary | ICD-10-CM

## 2011-04-29 ENCOUNTER — Encounter: Payer: Self-pay | Admitting: *Deleted

## 2011-04-29 ENCOUNTER — Encounter: Payer: Self-pay | Admitting: Family Medicine

## 2011-04-29 ENCOUNTER — Ambulatory Visit (INDEPENDENT_AMBULATORY_CARE_PROVIDER_SITE_OTHER): Payer: 59 | Admitting: Family Medicine

## 2011-04-29 VITALS — BP 122/78 | HR 104 | Temp 98.6°F | Wt 206.0 lb

## 2011-04-29 DIAGNOSIS — R109 Unspecified abdominal pain: Secondary | ICD-10-CM | POA: Insufficient documentation

## 2011-04-29 MED ORDER — DICYCLOMINE HCL 10 MG PO CAPS: ORAL_CAPSULE | ORAL | Status: DC

## 2011-04-29 MED ORDER — CLONAZEPAM 0.5 MG PO TABS: ORAL_TABLET | ORAL | Status: DC

## 2011-04-29 NOTE — Progress Notes (Signed)
Subjective:    Patient ID: Brenda [name], female    DOB: September 22, 1951, 60 y.o.   MRN: [id number]  HPI  60 yo here for abdominal pain.  Past several months, increased stressors. Feels like stomach is constantly in knots, very gasy and easily full. Thought it was her reflux but not having reflux symptoms, no sore throat, no morning cough. Prevacid and tums don't help. She is constipated some days and has diarrhea other days. No fevers, nausea, vomiting, or blood in her stool. UTD colonscopy- January 2011   H/o endometrial CA in 1998- s/p hysterectomy/oophrectomy.  No further tx necessary. UTD on colonoscopy  Patient Active Problem List  Diagnoses  . DIABETES MELLITUS, TYPE II  . HYPERLIPIDEMIA  . DEPRESSION  . HYPERTENSION  . GERD   Past Medical History  Diagnosis Date  . Depression   . Diabetes mellitus   . GERD (gastroesophageal reflux disease)   . Hyperlipidemia   . Hypertension   . Heart murmur   . Endometrial ca     1998  . Heel spur    Past Surgical History  Procedure Date  . Abdominal hysterectomy    History  Substance Use Topics  . Smoking status: Never Smoker   . Smokeless tobacco: Not on file  . Alcohol Use: Yes   Family History  Problem Relation Age of Onset  . Cancer Mother     breast cancer  . Cancer Father     plasma sarcoma   Allergies  Allergen Reactions  . Beta Adrenergic Blockers     REACTION: Decreased heart rate   Current Outpatient Prescriptions on File Prior to Visit  Medication Sig Dispense Refill  . clonazePAM (KLONOPIN) 0.5 MG tablet Take one tablet by mouth 3 times daily  90 tablet  0  . sitaGLIPtan-metformin (JANUMET) 50-1000 MG per tablet Take 1 tablet by mouth 2 (two) times daily with a meal.  180 tablet  3  . traMADol (ULTRAM) 50 MG tablet Take one tablet by mouth three times daily as needed  90 tablet  0     Review of Systems       See HPI General:  Denies malaise. Eyes:  Denies blurring. ENT:  Denies difficulty  swallowing. CV:  Denies chest pain or discomfort. Resp:  Denies shortness of breath. GU:  Denies dysuria. MS:  Denies joint pain, joint redness, and joint swelling. Derm:  Denies rash. Neuro:  Denies headaches. Psych:  Denies sense of great danger, suicidal thoughts/plans, thoughts of violence, unusual visions or sounds, and thoughts /plans of harming others. Endo:  Denies cold intolerance and heat intolerance. Heme:  Denies abnormal bruising and bleeding.    Objective:   Physical Exam BP 122/78  Pulse 104  Temp(Src) 98.6 F (37 C) (Oral)  Wt 206 lb (93.441 kg)  General:  alert and overweight-appearing.   Head:  normocephalic and no abnormalities observed.   Eyes:  vision grossly intact, pupils equal, pupils round, and pupils reactive to light.   Ears:  R ear normal and L ear normal.   Nose:  no external deformity.   Mouth:  good dentition.   Abdomen:  Bowel sounds positive,abdomen soft and non-tender without masses, organomegaly or hernias noted. Msk:  No deformity or scoliosis noted of thoracic or lumbar spine.   Extremities:  no edema Neurologic:  alert & oriented X3 and gait normal.   Skin:  Intact without suspicious lesions or rashes Psych:  Cognition and judgment appear intact. Alert and cooperative  with normal attention span and concentration. No apparent delusions, illusions, hallucinations     Assessment & Plan:   1. Abdominal  pain, other specified site   New. Seems most consistent with IBS. Pt is already on an SSRI, will try Bentyl. Pt to call next week.  If no improvement, will refer to GI for endoscopy. The patient indicates understanding of these issues and agrees with the plan.

## 2011-04-29 NOTE — Patient Instructions (Signed)
What is this medicine? DICYCLOMINE (dye SYE kloe meen) is used to treat bowel problems including irritable bowel syndrome. This medicine may be used for other purposes; ask your health care provider or pharmacist if you have questions.   What should I tell my health care provider before I take this medicine? They need to know if you have any of these conditions: -difficulty passing urine -esophagus problems or heartburn -glaucoma -heart disease, or previous heart attack -myasthenia gravis -prostate trouble -stomach infection, or obstruction -ulcerative colitis -an unusual or allergic reaction to dicyclomine, other medicines, foods, dyes, or preservatives -pregnant or trying to get pregnant -breast-feeding   How should I use this medicine? Take this medicine by mouth with a glass of water. Follow the directions on the prescription label. It is best to take this medicine on an empty stomach, 30 minutes to 1 hour before meals. Take your medicine at regular intervals. Do not take your medicine more often than directed.   Talk to your pediatrician regarding the use of this medicine in children. Special care may be needed. While this drug may be prescribed for children as young as 1 months of age for selected conditions, precautions do apply.   Patients over 37 years old may have a stronger reaction and need a smaller dose.   Overdosage: If you think you have taken too much of this medicine contact a poison control center or emergency room at once. NOTE: This medicine is only for you. Do not share this medicine with others.   What if I miss a dose? If you miss a dose, take it as soon as you can. If it is almost time for your next dose, take only that dose. Do not take double or extra doses.   What may interact with this medicine? -amantadine -antacids -benztropine -digoxin -disopyramide -medicines for allergies, colds and breathing difficulties -medicines for alzheimer's  disease -medicines for anxiety or sleeping problems -medicines for depression or psychotic disturbances -medicines for diarrhea -medicines for pain -metoclopramide -tegaserod   This list may not describe all possible interactions. Give your health care provider a list of all the medicines, herbs, non-prescription drugs, or dietary supplements you use. Also tell them if you smoke, drink alcohol, or use illegal drugs. Some items may interact with your medicine.   What should I watch for while using this medicine? You may get drowsy, dizzy, or have blurred vision. Do not drive, use machinery, or do anything that needs mental alertness until you know how this medicine affects you. To reduce the risk of dizzy or fainting spells, do not sit or stand up quickly, especially if you are an older patient. Alcohol can make you more drowsy, avoid alcoholic drinks.   Stay out of bright light and wear sunglasses if this medicine makes your eyes more sensitive to light.   Avoid extreme heat (hot tubs, saunas). This medicine can cause you to sweat less than normal. Your body temperature could increase to dangerous levels, which may lead to heat stroke.   Antacids can stop this medicine from working. If you get an upset stomach and want to take an antacid, make sure there is an interval of at least 1 to 2 hours before or after you take this medicine.   Your mouth may get dry. Chewing sugarless gum or sucking hard candy, and drinking plenty of water may help. Contact your doctor if the problem does not go away or is severe.   What side effects may I  notice from receiving this medicine? Side effects that you should report to your doctor or health care professional as soon as possible: -agitation, nervousness, confusion -difficulty swallowing -dizziness, drowsiness -fast or slow heartbeat -hallucinations -pain or difficulty passing urine   Side effects that usually do not require medical attention (report  to your doctor or health care professional if they continue or are bothersome): -constipation -headache -nausea or vomiting -sexual difficulty   This list may not describe all possible side effects. Call your doctor for medical advice about side effects. You may report side effects to FDA at 1-800-FDA-1088.   Where should I keep my medicine? Keep out of the reach of children.   Store at room temperature below 30 degrees C (86 degrees F). Protect from light. Throw away any unused medicine after the expiration date.   NOTE:This sheet is a summary. It may not cover all possible information. If you have questions about this medicine, talk to your doctor, pharmacist, or health care provider.      2011, Elsevier/Gold Standard.

## 2011-05-04 ENCOUNTER — Other Ambulatory Visit: Payer: Self-pay | Admitting: *Deleted

## 2011-05-04 MED ORDER — GLIPIZIDE 5 MG PO TABS: ORAL_TABLET | ORAL | Status: DC

## 2011-05-24 ENCOUNTER — Other Ambulatory Visit: Payer: Self-pay | Admitting: Family Medicine

## 2011-06-23 ENCOUNTER — Other Ambulatory Visit: Payer: Self-pay | Admitting: Family Medicine

## 2011-06-23 NOTE — Telephone Encounter (Signed)
Rx called to [facility].

## 2011-08-03 ENCOUNTER — Other Ambulatory Visit: Payer: Self-pay | Admitting: Family Medicine

## 2011-08-12 ENCOUNTER — Other Ambulatory Visit: Payer: Self-pay | Admitting: *Deleted

## 2011-08-12 MED ORDER — CLONAZEPAM 0.5 MG PO TABS: 0.5000 mg | ORAL_TABLET | Freq: Three times a day (TID) | ORAL | Status: DC

## 2011-08-12 NOTE — Telephone Encounter (Signed)
Rx called to [facility].

## 2011-08-12 NOTE — Telephone Encounter (Signed)
Last refill 06/23/2011.

## 2011-09-09 ENCOUNTER — Other Ambulatory Visit: Payer: Self-pay | Admitting: Orthopedic Surgery

## 2011-09-10 ENCOUNTER — Other Ambulatory Visit: Payer: Self-pay | Admitting: Orthopedic Surgery

## 2011-09-16 ENCOUNTER — Encounter: Payer: Self-pay | Admitting: Family Medicine

## 2011-09-20 ENCOUNTER — Encounter: Payer: Self-pay | Admitting: Family Medicine

## 2011-09-20 ENCOUNTER — Ambulatory Visit (INDEPENDENT_AMBULATORY_CARE_PROVIDER_SITE_OTHER): Payer: 59 | Admitting: Family Medicine

## 2011-09-20 VITALS — BP 132/80 | HR 96 | Temp 98.4°F | Wt 210.0 lb

## 2011-09-20 DIAGNOSIS — R9431 Abnormal electrocardiogram [ECG] [EKG]: Secondary | ICD-10-CM

## 2011-09-20 DIAGNOSIS — Z01818 Encounter for other preprocedural examination: Secondary | ICD-10-CM

## 2011-09-20 DIAGNOSIS — M1990 Unspecified osteoarthritis, unspecified site: Secondary | ICD-10-CM

## 2011-09-20 DIAGNOSIS — R011 Cardiac murmur, unspecified: Secondary | ICD-10-CM

## 2011-09-20 MED ORDER — CLONAZEPAM 0.5 MG PO TABS: 1.0000 mg | ORAL_TABLET | Freq: Three times a day (TID) | ORAL | Status: DC

## 2011-09-20 MED ORDER — TRAMADOL HCL 50 MG PO TABS: 100.0000 mg | ORAL_TABLET | Freq: Three times a day (TID) | ORAL | Status: DC | PRN

## 2011-09-20 NOTE — Progress Notes (Signed)
60 yo here for pre op clearance.   TKR scheduled after the holidays, Dr. Elmyra Ricks. Had arthroscopic surgery, but this did not help. Per pt, TKR is only remaining alternative to help with her pain. Pain is severe- taking two vicodin per day to function. Has as needed Tramadol, often takes Alleve as well.  Pt is a non smoker.  Has been under general anesthesia without any difficulties or complications in the past.  No CP or SOB.  DM- diagnosed in 2008, on Janumet 50/1000- 1 tab by mouth two times a day and Glipizide 2.5 mg daily. Checks CBGs two times a day, ranging between 116-180. Denies any recent episodes of hypoglycemia. Lab Results  Component Value Date   HGBA1C 6.5 04/15/2011   Not taking any blood thinning products other than ASA 81 mg daily.  EKG- sinus rhythm with occassional PACs and ant fasicular block, changed from 12/2010. She has had no chest pain, dizziness, SOB or palpitations.   Review of Systems  See HPI  General: Denies malaise.  Eyes: Denies blurring.  ENT: Denies difficulty swallowing.  CV: Denies chest pain or discomfort.  Resp: Denies shortness of breath.  GI: Denies abdominal pain.  Physical Exam  BP 132/80  Pulse 96  Temp(Src) 98.4 F (36.9 C) (Oral)  Wt 210 lb (95.255 kg)  General: alert and overweight-appearing.  Head: normocephalic and no abnormalities observed.  Eyes: vision grossly intact, pupils equal, pupils round, and pupils reactive to light.  Nose: no external deformity.  Mouth: good dentition.  Lungs: Normal respiratory effort, chest expands symmetrically. Lungs are clear to auscultation, no crackles or wheezes.  Heart: normal rate and grade 2/6 systolic murmur.  Msk: Left knee- lateral joint tenderness with effusion  Extremities: no edema  Neurologic: alert & oriented X3 and gait normal.  Psych: Cognition and judgment appear intact. Alert and cooperative with normal attention span and concentration. No apparent delusions, illusions,  hallucinations   Assessment and Plan:   1. Pre-operative clearance  She does have some EKG changes today with long term murmur, has not had a echo in several years. Will refer to cardiology for further evaluation prior to clearing for surgery. Pt is asymptomatic.   2. Murmur, cardiac  Unchanged, see above.   3. EKG abnormalities  See above. Ambulatory referral to Cardiology

## 2011-09-20 NOTE — Patient Instructions (Signed)
Good to see you. Please stop by to see Brenda [name] on your way out to set up your cardiology referral.

## 2011-09-20 NOTE — Progress Notes (Signed)
Rx for Clonazepam called to [facility].

## 2011-09-29 ENCOUNTER — Ambulatory Visit: Payer: 59 | Admitting: Cardiovascular Disease

## 2011-10-07 ENCOUNTER — Encounter: Payer: Self-pay | Admitting: Cardiovascular Disease

## 2011-10-15 ENCOUNTER — Encounter: Payer: Self-pay | Admitting: Cardiovascular Disease

## 2011-10-15 ENCOUNTER — Ambulatory Visit (INDEPENDENT_AMBULATORY_CARE_PROVIDER_SITE_OTHER): Payer: 59 | Admitting: Cardiovascular Disease

## 2011-10-15 DIAGNOSIS — I10 Essential (primary) hypertension: Secondary | ICD-10-CM

## 2011-10-15 DIAGNOSIS — R9431 Abnormal electrocardiogram [ECG] [EKG]: Secondary | ICD-10-CM

## 2011-10-15 DIAGNOSIS — E119 Type 2 diabetes mellitus without complications: Secondary | ICD-10-CM

## 2011-10-15 DIAGNOSIS — Z01818 Encounter for other preprocedural examination: Secondary | ICD-10-CM

## 2011-10-15 DIAGNOSIS — E785 Hyperlipidemia, unspecified: Secondary | ICD-10-CM

## 2011-10-15 MED ORDER — POTASSIUM CHLORIDE ER 10 MEQ PO TBCR: 10.0000 meq | EXTENDED_RELEASE_TABLET | Freq: Two times a day (BID) | ORAL | Status: DC | PRN

## 2011-10-15 MED ORDER — FUROSEMIDE 20 MG PO TABS: 20.0000 mg | ORAL_TABLET | Freq: Two times a day (BID) | ORAL | Status: DC | PRN

## 2011-10-15 NOTE — Assessment & Plan Note (Signed)
Cholesterol is at goal on the current lipid regimen. No changes to the medications were made.  

## 2011-10-15 NOTE — Assessment & Plan Note (Signed)
No history of coronary artery disease. EKG on today's visit is essentially normal. No further testing is needed. She will be low risk for her upcoming total knee replacement surgery  at [place]

## 2011-10-15 NOTE — Progress Notes (Signed)
Patient ID: Brenda [name], female    DOB: 1951/01/28, 60 y.o.   MRN: [id number]  HPI Comments: Ms Gallery is a very pleasant 60 -year-old woman with history of obesity, diabetes, recent knee pain with previous stress test and echocardiogram several years ago for abnormal EKG, who presents for preoperative evaluation prior to total knee replacement.  Her EKG was read as abnormal on her visit to Dr. Marjory Lies.  She reports that she feels well though is recovering from bronchitis which she developed one week ago. She has cough, flu-type symptoms. Overall she is improving. She does have mild edema in her lower extremities and does not feel that the HCTZ is helping very much. Edema is worse on the left than the right.  No significant shortness of breath with exertion. No chest pain.  She is able to do light housework but was limited by her knee. Prior to recent knee problems, she is very active with no complaints.  EKG shows normal sinus rhythm with rate 74 beats per minute with no significant ST or T wave changes   Outpatient Encounter Prescriptions as of 10/15/2011  Medication Sig Dispense Refill  . amLODipine-benazepril (LOTREL) 10-20 MG per capsule Take 1 capsule by mouth daily.        Marland Kitchen aspirin 81 MG tablet Take 81 mg by mouth daily.        Marland Kitchen buPROPion (WELLBUTRIN XL) 300 MG 24 hr tablet Take 300 mg by mouth daily.        . calcium carbonate (CALCIUM 500) 1250 MG tablet Take 1 tablet by mouth 2 (two) times daily.       . cholecalciferol (VITAMIN D) 1000 UNITS tablet Take 1,000 Units by mouth daily.       . clonazePAM (KLONOPIN) 0.5 MG tablet Take 2 tablets (1 mg total) by mouth 3 (three) times daily.  90 tablet  0  . ezetimibe-simvastatin (VYTORIN) 10-40 MG per tablet Take 1 tablet by mouth at bedtime.        Marland Kitchen glipiZIDE (GLUCOTROL) 5 MG tablet Take 1/2 tablet by mouth twice daily  90 tablet  3  . Glucosamine-Chondroitin-MSM 375-300-250 MG TABS Take 2 tablets by mouth daily.        .  hydrochlorothiazide 25 MG tablet Take 25 mg by mouth daily.        Marland Kitchen HYDROcodone-acetaminophen (NORCO) 5-325 MG per tablet Take 1 tablet by mouth every 4 (four) hours as needed.        . lansoprazole (PREVACID) 15 MG capsule Take 15 mg by mouth daily.        Marland Kitchen omega-3 acid ethyl esters (LOVAZA) 1 G capsule Take 2 g by mouth 2 (two) times daily.        . sitaGLIPtan-metformin (JANUMET) 50-1000 MG per tablet Take 1 tablet by mouth 2 (two) times daily with a meal.  180 tablet  3  . furosemide (LASIX) 20 MG tablet Take 1 tablet (20 mg total) by mouth 2 (two) times daily as needed.  60 tablet  6  . potassium chloride (K-DUR) 10 MEQ tablet Take 1 tablet (10 mEq total) by mouth 2 (two) times daily as needed.  60 tablet  6     Review of Systems  Constitutional: Negative.   HENT: Negative.   Eyes: Negative.   Respiratory: Negative.   Cardiovascular: Negative.   Gastrointestinal: Negative.   Musculoskeletal: Positive for joint swelling, arthralgias and gait problem.  Skin: Negative.   Neurological: Negative.   Hematological:  Negative.   Psychiatric/Behavioral: Negative.     BP 130/78  Pulse 73  Ht 5' 1"  (1.549 m)  Wt 210 lb (95.255 kg)  BMI 39.68 kg/m2  Physical Exam  Nursing note and vitals reviewed. Constitutional: She is oriented to person, place, and time. She appears well-developed and well-nourished.  HENT:  Head: Normocephalic.  Nose: Nose normal.  Mouth/Throat: Oropharynx is clear and moist.  Eyes: Conjunctivae are normal. Pupils are equal, round, and reactive to light.  Neck: Normal range of motion. Neck supple. No JVD present.  Cardiovascular: Normal rate, regular rhythm, S1 normal, S2 normal, normal heart sounds and intact distal pulses.  Exam reveals no gallop and no friction rub.   No murmur heard. Pulmonary/Chest: Effort normal and breath sounds normal. No respiratory distress. She has no wheezes. She has no rales. She exhibits no tenderness.  Abdominal: Soft. Bowel  sounds are normal. She exhibits no distension. There is no tenderness.  Musculoskeletal: Normal range of motion. She exhibits no edema and no tenderness.  Lymphadenopathy:    She has no cervical adenopathy.  Neurological: She is alert and oriented to person, place, and time. Coordination normal.  Skin: Skin is warm and dry. No rash noted. No erythema.  Psychiatric: She has a normal mood and affect. Her behavior is normal. Judgment and thought content normal.         Assessment and Plan

## 2011-10-15 NOTE — Assessment & Plan Note (Signed)
Blood pressure is well controlled on today's visit. No changes made to the medications. 

## 2011-10-15 NOTE — Assessment & Plan Note (Signed)
We have encouraged continued exercise, careful diet management in an effort to lose weight. 

## 2011-10-15 NOTE — Patient Instructions (Addendum)
You are doing well. Consider taking lasix as needed for edema with potassium Call if you are on lasix frequently for a BMP  Please call us if you have new issues that need to be addressed before your next appt.  The office will contact you for a follow up Appt. In 12 months

## 2011-10-20 ENCOUNTER — Other Ambulatory Visit: Payer: Self-pay | Admitting: *Deleted

## 2011-10-20 MED ORDER — CLONAZEPAM 0.5 MG PO TABS: 1.0000 mg | ORAL_TABLET | Freq: Three times a day (TID) | ORAL | Status: DC

## 2011-10-20 NOTE — Telephone Encounter (Signed)
OK to refill? Last seen and filled 11/12

## 2011-10-20 NOTE — Telephone Encounter (Signed)
Rx called to [facility].

## 2011-11-08 ENCOUNTER — Other Ambulatory Visit: Payer: Self-pay | Admitting: Family Medicine

## 2011-11-22 ENCOUNTER — Other Ambulatory Visit: Payer: Self-pay | Admitting: Family Medicine

## 2011-11-22 ENCOUNTER — Other Ambulatory Visit: Payer: Self-pay | Admitting: *Deleted

## 2011-11-22 MED ORDER — OMEGA-3-ACID ETHYL ESTERS 1 G PO CAPS: 2.0000 g | ORAL_CAPSULE | Freq: Two times a day (BID) | ORAL | Status: DC

## 2011-11-22 NOTE — Telephone Encounter (Signed)
Rx called to [facility] to [place].

## 2011-12-14 ENCOUNTER — Encounter (HOSPITAL_COMMUNITY): Payer: Self-pay | Admitting: Pharmacy Technician

## 2011-12-15 ENCOUNTER — Encounter (HOSPITAL_COMMUNITY): Payer: Self-pay

## 2011-12-15 ENCOUNTER — Encounter (HOSPITAL_COMMUNITY)
Admission: RE | Admit: 2011-12-15 | Discharge: 2011-12-15 | Disposition: A | Discharge status: Home / Self Care | Payer: 59 | Source: Ambulatory Visit | Attending: Orthopedic Surgery | Admitting: Orthopedic Surgery

## 2011-12-15 ENCOUNTER — Ambulatory Visit (HOSPITAL_COMMUNITY)
Admission: RE | Admit: 2011-12-15 | Discharge: 2011-12-15 | Disposition: A | Discharge status: Home / Self Care | Payer: 59 | Source: Ambulatory Visit | Attending: Orthopedic Surgery | Admitting: Orthopedic Surgery

## 2011-12-15 DIAGNOSIS — M1710 Unilateral primary osteoarthritis, unspecified knee: Secondary | ICD-10-CM | POA: Insufficient documentation

## 2011-12-15 DIAGNOSIS — I10 Essential (primary) hypertension: Secondary | ICD-10-CM | POA: Insufficient documentation

## 2011-12-15 DIAGNOSIS — E119 Type 2 diabetes mellitus without complications: Secondary | ICD-10-CM | POA: Insufficient documentation

## 2011-12-15 DIAGNOSIS — Z01812 Encounter for preprocedural laboratory examination: Secondary | ICD-10-CM | POA: Insufficient documentation

## 2011-12-15 DIAGNOSIS — K219 Gastro-esophageal reflux disease without esophagitis: Secondary | ICD-10-CM | POA: Insufficient documentation

## 2011-12-15 DIAGNOSIS — Z01818 Encounter for other preprocedural examination: Secondary | ICD-10-CM | POA: Insufficient documentation

## 2011-12-15 DIAGNOSIS — E669 Obesity, unspecified: Secondary | ICD-10-CM | POA: Insufficient documentation

## 2011-12-15 HISTORY — DX: Other specified postprocedural states: Z98.890

## 2011-12-15 HISTORY — DX: Nausea with vomiting, unspecified: R11.2

## 2011-12-15 LAB — COMPREHENSIVE METABOLIC PANEL
AST: 22 U/L (ref 0–37)
Albumin: 4.2 g/dL (ref 3.5–5.2)
Alkaline Phosphatase: 100 U/L (ref 39–117)
BUN: 27 mg/dL — ABNORMAL HIGH (ref 6–23)
Chloride: 99 mEq/L (ref 96–112)
Potassium: 4 mEq/L (ref 3.5–5.1)
Total Bilirubin: 0.4 mg/dL (ref 0.3–1.2)
Total Protein: 7.2 g/dL (ref 6.0–8.3)

## 2011-12-15 LAB — SURGICAL PCR SCREEN
MRSA, PCR: NEGATIVE
Staphylococcus aureus: NEGATIVE

## 2011-12-15 LAB — URINALYSIS, ROUTINE W REFLEX MICROSCOPIC
Hgb urine dipstick: NEGATIVE
Nitrite: NEGATIVE
Specific Gravity, Urine: 1.025 (ref 1.005–1.030)
Urobilinogen, UA: 0.2 mg/dL (ref 0.0–1.0)
pH: 5 (ref 5.0–8.0)

## 2011-12-15 LAB — PROTIME-INR
INR: 0.95 (ref 0.00–1.49)
Prothrombin Time: 12.9 seconds (ref 11.6–15.2)

## 2011-12-15 LAB — CBC
HCT: 41.7 % (ref 36.0–46.0)
MCHC: 33.8 g/dL (ref 30.0–36.0)
Platelets: 292 10*3/uL (ref 150–400)
RDW: 12.7 % (ref 11.5–15.5)
WBC: 14 10*3/uL — ABNORMAL HIGH (ref 4.0–10.5)

## 2011-12-15 LAB — APTT: aPTT: 29 seconds (ref 24–37)

## 2011-12-15 NOTE — Patient Instructions (Signed)
20 Brenda [name]  12/15/2011   Your procedure is scheduled on:  Monday 12/20/2011  Report to [facility] at [place] AM.  Call this number if you have problems the morning of surgery: [phone]   Remember:   Do not eat food:After Midnight.  May have clear liquids:until Midnight .  Clear liquids include soda, tea, black coffee, apple or grape juice, broth.  Take these medicines the morning of surgery with A SIP OF WATER: Wellbutrin,Klonopin,Prevacid,Norco if needed, No Diabetic meds. Am of surgery   Do not wear jewelry, make-up or nail polish.  Do not wear lotions, powders, or perfumes..  Do not shave 48 hours prior to surgery.(women only-shaving legs)  Do not bring valuables to the hospital.  Contacts, dentures or bridgework may not be worn into surgery.  Leave suitcase in the car. After surgery it may be brought to your room.  For patients admitted to the hospital, checkout time is 11:00 AM the day of discharge.   Patients discharged the day of surgery will not be allowed to drive home.  Name and phone number of your driver:   Special Instructions: CHG Shower Use Special Wash: 1/2 bottle night before surgery and 1/2 bottle morning of surgery.   Please read over the following fact sheets that you were given: MRSA Information

## 2011-12-15 NOTE — Pre-Procedure Instructions (Signed)
Talked to Santiago Bur, RT at Dr. Anne Fu office to inform her to let him know of abnormal WBC,Glucose,BUN,GFR - she states she will relay the message to [place] his PA

## 2011-12-17 ENCOUNTER — Other Ambulatory Visit: Payer: Self-pay | Admitting: Orthopedic Surgery

## 2011-12-17 NOTE — H&P (Signed)
Brenda [name]  DOB: October 08, 1951 Single / Language: English / Race: White / Female   Date of Admission:  12/20/2011  Chief complaint:  Let knee pain  History of Present Illness The patient is a 61 year old female who comes in today for a preoperative History and Physical. The patient is scheduled for a left total knee arthroplasty to be performed by Dr. Dione Plover. Aluisio, MD at [facility] on 12/20/2011. The patient is a 61 year old female who presents for her left knee. The patient is being followed for their left knee pain and osteoarthritis. Symptoms reported today include: pain, stiffness, grinding and instability. The patient feels that they are doing poorly and report their pain level to be moderate. Current treatment includes: pain medications to include taking one hydrocodone with two Aleve every morning and one more hydrocodone 8 hours later. The patient has reported improvement of their symptoms with Cortisone injections that usually helped for about 6 weeks. Unfortunately, the pain contim=nues to be progressive in nature. They have been treated conservatively in the past for the above stated problem and despite conservative measures, they continue to have progressive pain and severe functional limitations and dysfunction. They have failed non-operative management including home exercise, medications, and injections. It is felt that they would benefit from undergoing total joint replacement. Risks and benefits of the procedure have been discussed with the patient and they elect to proceed with surgery. There are no active contraindications to surgery such as ongoing infection or rapidly progressive neurological disease.  Problem List/Past Medical Anxiety Disorder Depression Diabetes Mellitus, Type II Gastroesophageal Reflux Disease Heart murmur High blood pressure Hypercholesterolemia Tinnitus Hypertension Cancer.  Endometrial Measles Rubella Menopause  Allergies No Known Drug Allergies BETA BLOCKERS. 07/25/2009 Intolerance - Slow heart rate (Marked as Inactive)  Family History Diabetes Mellitus. mother and brother Chronic Obstructive Lung Disease. father Liver Disease, Chronic. brother Hypertension. mother, brother and grandmother mothers side Cancer. mother and father Bleeding disorder. brother Osteoarthritis. mother, father and grandmother mothers side  Social History Exercise. Exercises rarely Living situation. live alone Alcohol use. never consumed alcohol Marital status. single Number of flights of stairs before winded. 2-3 Tobacco use. never smoker Tobacco / smoke exposure. no Post-Surgical Plans. Plan is for Home  Medication History ClonazePAM (0.5MG Tablet Disperse, Oral) Active. Amlodipine Besy-Benazepril HCl (10-20MG Capsule, Oral) Active. Janumet (50-1000MG Tablet, Oral) Active. Vytorin (10-40MG Tablet, Oral) Active. BuPROPion HCl (XL) (300MG Tablet ER 24HR, Oral) Active. Lovaza (1GM Capsule, Oral) Active. Prevacid (15MG Capsule DR, Oral) Active. Calcium Gluconate (500MG Tablet, Oral) Active. Vitamin D (1000UNIT Tablet, Oral) Active. Aspirin Adult Low Strength (81MG Tablet DR, Oral) Active. GlipiZIDE (5MG Tablet ER 24HR, Oral) Active. Hydrochlorothiazide (25MG Tablet, Oral) Active. Potassium Chloride (10MEQ Capsule ER, Oral) Active. Lasix (20MG Tablet, Oral) Active.  Past Surgical History Arthroscopic Knee Surgery - Left. 01/27/11 Hysterectomy. complete (cancerous)  Review of Systems General:Present- Fatigue. Not Present- Chills, Fever, Night Sweats, Weight Gain, Weight Loss and Memory Loss. Skin:Not Present- Hives, Itching, Rash, Eczema and Lesions. HEENT:Present- Tinnitus. Not Present- Headache, Double Vision, Visual Loss, Hearing Loss and Dentures. Respiratory:Not Present- Shortness of breath with exertion, Shortness of breath at rest,  Allergies, Coughing up blood and Chronic Cough. Cardiovascular:Not Present- Chest Pain, Racing/skipping heartbeats, Difficulty Breathing Lying Down, Murmur, Swelling and Palpitations. Gastrointestinal:Present- Heartburn. Not Present- Bloody Stool, Abdominal Pain, Vomiting, Nausea, Constipation, Diarrhea, Difficulty Swallowing, Jaundice and Loss of appetitie. Female Genitourinary:Not Present- Blood in Urine, Urinary frequency, Weak urinary stream, Discharge, Flank Pain, Incontinence,  Painful Urination, Urgency, Urinary Retention and Urinating at Night. Musculoskeletal:Present- Joint Swelling, Joint Pain and Morning Stiffness. Not Present- Muscle Weakness, Muscle Pain, Back Pain and Spasms. Neurological:Not Present- Tremor, Dizziness, Blackout spells, Paralysis, Difficulty with balance and Weakness. Psychiatric:Not Present- Insomnia.   Vitals Weight: 209 lb Height: 60.5 in Body Surface Area: 2.01 m Body Mass Index: 40.15 kg/m Pulse: 109 (Regular) Resp.: 16 (Unlabored) BP: 143/90 (Sitting, Right Arm, Standard)  Physical Exam The physical exam findings are as follows:  Patient is a 61 year old female with continued Knee pain.   General Mental Status - Alert, cooperative and good historian. General Appearance- pleasant. Not in acute distress. Orientation- Oriented X3. Build & Nutrition- Overweight, Well nourished and Well developed.   Head and Neck Head- normocephalic, atraumatic . Neck Global Assessment- supple. no bruit auscultated on the right and no bruit auscultated on the left.   Eye Pupil- Bilateral- Regular and Round. Note: Wears glasses Motion- Bilateral- EOMI.   Chest and Lung Exam Auscultation: Breath sounds:- clear at anterior chest wall and - clear at posterior chest wall. Adventitious sounds:- No Adventitious sounds.   Cardiovascular Auscultation:Rhythm- Regular rate and rhythm. Heart Sounds- S1 WNL and S2 WNL. Murmurs &  Other Heart Sounds:Auscultation of the heart reveals - No Murmurs.   Abdomen Palpation/Percussion:Tenderness- Abdomen is non-tender to palpation. Rigidity (guarding)- Abdomen is soft. Auscultation:Auscultation of the abdomen reveals - Bowel sounds normal.   Female Genitourinary Not done, not pertinent to present illness  Musculoskeletal Well developed female, alert and oriented in no apparent distress. There is a moderate sized effusion, left knee. She has about 2+ edema in both lower legs. The left side is a little worse than the right. Homans sign is negative. No instability about the knee.  Assessment and Plan Osteoarthritis Left Knee  Patient is for a Left Total Knee Arthroplasty by Dr. Wynelle Link.  Plan is to go home after the surgery.  PCP - Dr. Arnette Norris Cards - Dr. Deretha Emory, PA-C

## 2011-12-19 NOTE — Anesthesia Preprocedure Evaluation (Addendum)
Anesthesia Evaluation  Patient identified by MRN, date of birth, ID band Patient awake    Reviewed: Allergy & Precautions, H&P , NPO status , Patient's Chart, lab work & pertinent test results  History of Anesthesia Complications (+) PONV  Airway Mallampati: II TM Distance: >3 FB Neck ROM: Full    Dental No notable dental hx.    Pulmonary neg pulmonary ROS,  clear to auscultation  Pulmonary exam normal       Cardiovascular hypertension, Pt. on medications + Valvular Problems/Murmurs Regular Normal    Neuro/Psych Negative Neurological ROS  Negative Psych ROS   GI/Hepatic Neg liver ROS, GERD-  Medicated,  Endo/Other  Diabetes mellitus-, Type 2, Oral Hypoglycemic Agents  Renal/GU negative Renal ROS  Genitourinary negative   Musculoskeletal negative musculoskeletal ROS (+)   Abdominal (+) obese,   Peds negative pediatric ROS (+)  Hematology negative hematology ROS (+)   Anesthesia Other Findings   Reproductive/Obstetrics negative OB ROS                           Anesthesia Physical Anesthesia Plan  ASA: III  Anesthesia Plan: Spinal   Post-op Pain Management:    Induction: Intravenous  Airway Management Planned: Simple Face Mask  Additional Equipment:   Intra-op Plan:   Post-operative Plan: Extubation in OR  Informed Consent: I have reviewed the patients History and Physical, chart, labs and discussed the procedure including the risks, benefits and alternatives for the proposed anesthesia with the patient or authorized representative who has indicated his/her understanding and acceptance.   Dental advisory given  Plan Discussed with: CRNA  Anesthesia Plan Comments: (Discussed risks/benefits of spinal including headache, backache, failure, bleeding, infection, and nerve damage. Patient consents to spinal. Questions answered. Coagulation studies and platelet count acceptable. )       Anesthesia Quick Evaluation

## 2011-12-20 ENCOUNTER — Encounter (HOSPITAL_COMMUNITY)
Admission: RE | Disposition: A | Discharge status: Home Health | Payer: Self-pay | Source: Ambulatory Visit | Attending: Orthopedic Surgery

## 2011-12-20 ENCOUNTER — Encounter (HOSPITAL_COMMUNITY): Payer: Self-pay | Admitting: *Deleted

## 2011-12-20 ENCOUNTER — Encounter (HOSPITAL_COMMUNITY): Payer: Self-pay | Admitting: Anesthesiology

## 2011-12-20 ENCOUNTER — Inpatient Hospital Stay (HOSPITAL_COMMUNITY): Payer: 59 | Admitting: Anesthesiology

## 2011-12-20 ENCOUNTER — Inpatient Hospital Stay (HOSPITAL_COMMUNITY)
Admission: RE | Admit: 2011-12-20 | Discharge: 2011-12-23 | DRG: 470 | Disposition: A | Discharge status: Home Health | Payer: 59 | Source: Ambulatory Visit | Attending: Orthopedic Surgery | Admitting: Orthopedic Surgery

## 2011-12-20 DIAGNOSIS — E119 Type 2 diabetes mellitus without complications: Secondary | ICD-10-CM | POA: Diagnosis present

## 2011-12-20 DIAGNOSIS — Z96659 Presence of unspecified artificial knee joint: Secondary | ICD-10-CM

## 2011-12-20 DIAGNOSIS — Z8542 Personal history of malignant neoplasm of other parts of uterus: Secondary | ICD-10-CM

## 2011-12-20 DIAGNOSIS — E871 Hypo-osmolality and hyponatremia: Secondary | ICD-10-CM | POA: Diagnosis not present

## 2011-12-20 DIAGNOSIS — E7800 Pure hypercholesterolemia, unspecified: Secondary | ICD-10-CM | POA: Diagnosis present

## 2011-12-20 DIAGNOSIS — M1710 Unilateral primary osteoarthritis, unspecified knee: Principal | ICD-10-CM | POA: Diagnosis present

## 2011-12-20 DIAGNOSIS — I10 Essential (primary) hypertension: Secondary | ICD-10-CM | POA: Diagnosis present

## 2011-12-20 DIAGNOSIS — F411 Generalized anxiety disorder: Secondary | ICD-10-CM | POA: Diagnosis present

## 2011-12-20 DIAGNOSIS — K219 Gastro-esophageal reflux disease without esophagitis: Secondary | ICD-10-CM | POA: Diagnosis present

## 2011-12-20 DIAGNOSIS — F329 Major depressive disorder, single episode, unspecified: Secondary | ICD-10-CM | POA: Diagnosis present

## 2011-12-20 DIAGNOSIS — F3289 Other specified depressive episodes: Secondary | ICD-10-CM | POA: Diagnosis present

## 2011-12-20 HISTORY — PX: TOTAL KNEE ARTHROPLASTY: SHX125

## 2011-12-20 LAB — TYPE AND SCREEN
ABO/RH(D): O POS
Antibody Screen: NEGATIVE

## 2011-12-20 LAB — ABO/RH: ABO/RH(D): O POS

## 2011-12-20 SURGERY — ARTHROPLASTY, KNEE, TOTAL
Anesthesia: General | Site: Knee | Laterality: Left | Wound class: Clean

## 2011-12-20 MED ORDER — MORPHINE SULFATE (PF) 1 MG/ML IV SOLN
INTRAVENOUS | Status: DC
  Administered 2011-12-20 (×2): via INTRAVENOUS
  Administered 2011-12-20: 3 mg via INTRAVENOUS
  Administered 2011-12-20: 2 mg via INTRAVENOUS
  Administered 2011-12-21: 5 mg via INTRAVENOUS
  Administered 2011-12-21: 8 mg via INTRAVENOUS
  Administered 2011-12-21: 9 mg via INTRAVENOUS
  Filled 2011-12-20: qty 25

## 2011-12-20 MED ORDER — ONDANSETRON HCL 4 MG/2ML IJ SOLN
INTRAMUSCULAR | Status: DC | PRN
  Administered 2011-12-20: 4 mg via INTRAVENOUS

## 2011-12-20 MED ORDER — BUPIVACAINE ON-Q PAIN PUMP (FOR ORDER SET NO CHG)
INJECTION | Status: DC
  Filled 2011-12-20: qty 1

## 2011-12-20 MED ORDER — AMLODIPINE BESYLATE 10 MG PO TABS
10.0000 mg | ORAL_TABLET | Freq: Every day | ORAL | Status: DC
  Administered 2011-12-21 – 2011-12-23 (×2): 10 mg via ORAL
  Filled 2011-12-20 (×3): qty 1

## 2011-12-20 MED ORDER — ACETAMINOPHEN 10 MG/ML IV SOLN
1000.0000 mg | Freq: Once | INTRAVENOUS | Status: AC
  Administered 2011-12-20: 1000 mg via INTRAVENOUS

## 2011-12-20 MED ORDER — BISACODYL 10 MG RE SUPP
10.0000 mg | Freq: Every day | RECTAL | Status: DC | PRN
  Filled 2011-12-20: qty 1

## 2011-12-20 MED ORDER — MORPHINE SULFATE (PF) 1 MG/ML IV SOLN
INTRAVENOUS | Status: AC
  Filled 2011-12-20: qty 25

## 2011-12-20 MED ORDER — FLEET ENEMA 7-19 GM/118ML RE ENEM: 1.0000 | ENEMA | Freq: Once | RECTAL | Status: AC | PRN

## 2011-12-20 MED ORDER — FUROSEMIDE 20 MG PO TABS
20.0000 mg | ORAL_TABLET | Freq: Two times a day (BID) | ORAL | Status: DC | PRN
  Filled 2011-12-20: qty 1

## 2011-12-20 MED ORDER — FENTANYL CITRATE 0.05 MG/ML IJ SOLN
25.0000 ug | INTRAMUSCULAR | Status: DC | PRN
  Administered 2011-12-20: 25 ug via INTRAVENOUS

## 2011-12-20 MED ORDER — CISATRACURIUM BESYLATE 2 MG/ML IV SOLN
INTRAVENOUS | Status: DC | PRN
  Administered 2011-12-20: 6 mg via INTRAVENOUS

## 2011-12-20 MED ORDER — DOCUSATE SODIUM 100 MG PO CAPS
100.0000 mg | ORAL_CAPSULE | Freq: Two times a day (BID) | ORAL | Status: DC
  Administered 2011-12-20 – 2011-12-23 (×6): 100 mg via ORAL
  Filled 2011-12-20 (×8): qty 1

## 2011-12-20 MED ORDER — BUPROPION HCL ER (XL) 300 MG PO TB24
300.0000 mg | ORAL_TABLET | Freq: Every day | ORAL | Status: DC
  Administered 2011-12-21 – 2011-12-23 (×3): 300 mg via ORAL
  Filled 2011-12-20 (×3): qty 1

## 2011-12-20 MED ORDER — EZETIMIBE-SIMVASTATIN 10-40 MG PO TABS
1.0000 | ORAL_TABLET | Freq: Every day | ORAL | Status: DC
Start: 2011-12-20 — End: 2011-12-20
  Filled 2011-12-20: qty 1

## 2011-12-20 MED ORDER — AMLODIPINE BESYLATE 10 MG PO TABS
10.0000 mg | ORAL_TABLET | Freq: Every day | ORAL | Status: DC
  Administered 2011-12-20: 10 mg via ORAL
  Filled 2011-12-20 (×2): qty 1

## 2011-12-20 MED ORDER — CEFAZOLIN SODIUM-DEXTROSE 2-3 GM-% IV SOLR
2.0000 g | Freq: Once | INTRAVENOUS | Status: AC
  Administered 2011-12-20: 2 g via INTRAVENOUS

## 2011-12-20 MED ORDER — SODIUM CHLORIDE 0.9 % IR SOLN
Status: DC | PRN
  Administered 2011-12-20: 1000 mL

## 2011-12-20 MED ORDER — NALOXONE HCL 0.4 MG/ML IJ SOLN: 0.4000 mg | INTRAMUSCULAR | Status: DC | PRN

## 2011-12-20 MED ORDER — HYDROMORPHONE HCL PF 1 MG/ML IJ SOLN
INTRAMUSCULAR | Status: AC
  Filled 2011-12-20: qty 1

## 2011-12-20 MED ORDER — CLONAZEPAM 1 MG PO TABS
1.0000 mg | ORAL_TABLET | Freq: Three times a day (TID) | ORAL | Status: DC
  Administered 2011-12-20 – 2011-12-23 (×9): 1 mg via ORAL
  Filled 2011-12-20 (×9): qty 1

## 2011-12-20 MED ORDER — METOCLOPRAMIDE HCL 5 MG/ML IJ SOLN: 5.0000 mg | Freq: Three times a day (TID) | INTRAMUSCULAR | Status: DC | PRN

## 2011-12-20 MED ORDER — LACTATED RINGERS IV SOLN
INTRAVENOUS | Status: DC | PRN
  Administered 2011-12-20 (×2): via INTRAVENOUS

## 2011-12-20 MED ORDER — PHENOL 1.4 % MT LIQD
1.0000 | OROMUCOSAL | Status: DC | PRN
  Filled 2011-12-20: qty 177

## 2011-12-20 MED ORDER — SITAGLIPTIN PHOS-METFORMIN HCL 50-1000 MG PO TABS: 1.0000 | ORAL_TABLET | Freq: Two times a day (BID) | ORAL | Status: DC

## 2011-12-20 MED ORDER — ROSUVASTATIN CALCIUM 10 MG PO TABS
10.0000 mg | ORAL_TABLET | Freq: Every day | ORAL | Status: DC
  Administered 2011-12-20 – 2011-12-22 (×3): 10 mg via ORAL
  Filled 2011-12-20 (×4): qty 1

## 2011-12-20 MED ORDER — BUPIVACAINE 0.25 % ON-Q PUMP SINGLE CATH 300ML: 300.0000 mL | INJECTION | Status: DC

## 2011-12-20 MED ORDER — DIPHENHYDRAMINE HCL 12.5 MG/5ML PO ELIX
12.5000 mg | ORAL_SOLUTION | Freq: Four times a day (QID) | ORAL | Status: DC | PRN
  Filled 2011-12-20: qty 5

## 2011-12-20 MED ORDER — METHOCARBAMOL 100 MG/ML IJ SOLN
500.0000 mg | Freq: Four times a day (QID) | INTRAVENOUS | Status: DC | PRN
  Administered 2011-12-20: 500 mg via INTRAVENOUS
  Filled 2011-12-20 (×2): qty 5

## 2011-12-20 MED ORDER — RIVAROXABAN 10 MG PO TABS
10.0000 mg | ORAL_TABLET | Freq: Every day | ORAL | Status: DC
  Administered 2011-12-21 – 2011-12-23 (×3): 10 mg via ORAL
  Filled 2011-12-20 (×3): qty 1

## 2011-12-20 MED ORDER — POLYETHYLENE GLYCOL 3350 17 G PO PACK
17.0000 g | PACK | Freq: Every day | ORAL | Status: DC | PRN
  Filled 2011-12-20: qty 1

## 2011-12-20 MED ORDER — EZETIMIBE 10 MG PO TABS
10.0000 mg | ORAL_TABLET | Freq: Every day | ORAL | Status: DC
  Administered 2011-12-20 – 2011-12-22 (×3): 10 mg via ORAL
  Filled 2011-12-20 (×4): qty 1

## 2011-12-20 MED ORDER — CHLORHEXIDINE GLUCONATE 4 % EX LIQD: 60.0000 mL | Freq: Once | CUTANEOUS | Status: DC

## 2011-12-20 MED ORDER — ACETAMINOPHEN 10 MG/ML IV SOLN
1000.0000 mg | Freq: Four times a day (QID) | INTRAVENOUS | Status: AC
  Administered 2011-12-20 – 2011-12-21 (×4): 1000 mg via INTRAVENOUS
  Filled 2011-12-20 (×4): qty 100

## 2011-12-20 MED ORDER — LINAGLIPTIN 5 MG PO TABS
5.0000 mg | ORAL_TABLET | Freq: Every day | ORAL | Status: DC
  Administered 2011-12-21 – 2011-12-23 (×3): 5 mg via ORAL
  Filled 2011-12-20 (×4): qty 1

## 2011-12-20 MED ORDER — EPHEDRINE SULFATE 50 MG/ML IJ SOLN
INTRAMUSCULAR | Status: DC | PRN
  Administered 2011-12-20: 5 mg via INTRAVENOUS

## 2011-12-20 MED ORDER — GLIPIZIDE 2.5 MG HALF TABLET
2.5000 mg | ORAL_TABLET | Freq: Two times a day (BID) | ORAL | Status: DC
  Administered 2011-12-20 – 2011-12-23 (×6): 2.5 mg via ORAL
  Filled 2011-12-20 (×7): qty 1

## 2011-12-20 MED ORDER — PANTOPRAZOLE SODIUM 20 MG PO TBEC
20.0000 mg | DELAYED_RELEASE_TABLET | Freq: Every day | ORAL | Status: DC
  Filled 2011-12-20 (×2): qty 1

## 2011-12-20 MED ORDER — MENTHOL 3 MG MT LOZG
1.0000 | LOZENGE | OROMUCOSAL | Status: DC | PRN
  Filled 2011-12-20: qty 9

## 2011-12-20 MED ORDER — AMLODIPINE BESY-BENAZEPRIL HCL 10-20 MG PO CAPS: 1.0000 | ORAL_CAPSULE | Freq: Every day | ORAL | Status: DC

## 2011-12-20 MED ORDER — METOCLOPRAMIDE HCL 10 MG PO TABS
5.0000 mg | ORAL_TABLET | Freq: Three times a day (TID) | ORAL | Status: DC | PRN
  Filled 2011-12-20: qty 1

## 2011-12-20 MED ORDER — SODIUM CHLORIDE 0.9 % IV SOLN
INTRAVENOUS | Status: DC
  Administered 2011-12-20 – 2011-12-21 (×3): via INTRAVENOUS
  Filled 2011-12-20: qty 1000

## 2011-12-20 MED ORDER — TRAMADOL HCL 50 MG PO TABS
50.0000 mg | ORAL_TABLET | Freq: Three times a day (TID) | ORAL | Status: DC | PRN
  Administered 2011-12-21 – 2011-12-22 (×3): 50 mg via ORAL
  Filled 2011-12-20 (×3): qty 1

## 2011-12-20 MED ORDER — LACTATED RINGERS IV SOLN
Freq: Once | INTRAVENOUS | Status: AC
  Administered 2011-12-20: 300 mL via INTRAVENOUS

## 2011-12-20 MED ORDER — DIPHENHYDRAMINE HCL 12.5 MG/5ML PO ELIX: 12.5000 mg | ORAL_SOLUTION | ORAL | Status: DC | PRN

## 2011-12-20 MED ORDER — HYDROMORPHONE HCL PF 1 MG/ML IJ SOLN
0.2500 mg | INTRAMUSCULAR | Status: DC | PRN
  Administered 2011-12-20 (×4): 0.5 mg via INTRAVENOUS

## 2011-12-20 MED ORDER — ONDANSETRON HCL 4 MG/2ML IJ SOLN: 4.0000 mg | Freq: Four times a day (QID) | INTRAMUSCULAR | Status: DC | PRN

## 2011-12-20 MED ORDER — HYDRALAZINE HCL 20 MG/ML IJ SOLN
INTRAMUSCULAR | Status: DC | PRN
  Administered 2011-12-20 (×2): 5 mg via INTRAVENOUS

## 2011-12-20 MED ORDER — ACETAMINOPHEN 325 MG PO TABS
650.0000 mg | ORAL_TABLET | Freq: Four times a day (QID) | ORAL | Status: DC | PRN
  Administered 2011-12-22: 650 mg via ORAL
  Filled 2011-12-20: qty 2

## 2011-12-20 MED ORDER — SUCCINYLCHOLINE CHLORIDE 20 MG/ML IJ SOLN
INTRAMUSCULAR | Status: DC | PRN
  Administered 2011-12-20: 100 mg via INTRAVENOUS

## 2011-12-20 MED ORDER — PROMETHAZINE HCL 25 MG/ML IJ SOLN: 6.2500 mg | INTRAMUSCULAR | Status: DC | PRN

## 2011-12-20 MED ORDER — HYDROCHLOROTHIAZIDE 25 MG PO TABS
25.0000 mg | ORAL_TABLET | Freq: Every day | ORAL | Status: DC
  Administered 2011-12-21 – 2011-12-23 (×3): 25 mg via ORAL
  Filled 2011-12-20 (×3): qty 1

## 2011-12-20 MED ORDER — PROPOFOL 10 MG/ML IV BOLUS
INTRAVENOUS | Status: DC | PRN
  Administered 2011-12-20: 150 mg via INTRAVENOUS

## 2011-12-20 MED ORDER — OXYCODONE HCL 5 MG PO TABS
5.0000 mg | ORAL_TABLET | ORAL | Status: DC | PRN
  Administered 2011-12-21 (×4): 10 mg via ORAL
  Administered 2011-12-21: 5 mg via ORAL
  Administered 2011-12-22 – 2011-12-23 (×7): 10 mg via ORAL
  Filled 2011-12-20 (×6): qty 2
  Filled 2011-12-20: qty 1
  Filled 2011-12-20 (×6): qty 2

## 2011-12-20 MED ORDER — BUPIVACAINE 0.25 % ON-Q PUMP SINGLE CATH 300ML
INJECTION | Status: DC | PRN
  Administered 2011-12-20: 300 mL

## 2011-12-20 MED ORDER — FENTANYL CITRATE 0.05 MG/ML IJ SOLN
INTRAMUSCULAR | Status: AC
  Filled 2011-12-20: qty 2

## 2011-12-20 MED ORDER — SODIUM CHLORIDE 0.9 % IV SOLN: INTRAVENOUS | Status: DC

## 2011-12-20 MED ORDER — DIPHENHYDRAMINE HCL 50 MG/ML IJ SOLN
12.5000 mg | Freq: Four times a day (QID) | INTRAMUSCULAR | Status: DC | PRN
  Filled 2011-12-20: qty 0.25

## 2011-12-20 MED ORDER — TEMAZEPAM 15 MG PO CAPS: 15.0000 mg | ORAL_CAPSULE | Freq: Every evening | ORAL | Status: DC | PRN

## 2011-12-20 MED ORDER — ACETAMINOPHEN 650 MG RE SUPP: 650.0000 mg | Freq: Four times a day (QID) | RECTAL | Status: DC | PRN

## 2011-12-20 MED ORDER — FENTANYL CITRATE 0.05 MG/ML IJ SOLN
INTRAMUSCULAR | Status: DC | PRN
  Administered 2011-12-20 (×3): 50 ug via INTRAVENOUS

## 2011-12-20 MED ORDER — METHOCARBAMOL 500 MG PO TABS
500.0000 mg | ORAL_TABLET | Freq: Four times a day (QID) | ORAL | Status: DC | PRN
  Administered 2011-12-20 – 2011-12-23 (×8): 500 mg via ORAL
  Filled 2011-12-20 (×8): qty 1

## 2011-12-20 MED ORDER — BUPIVACAINE 0.25 % ON-Q PUMP SINGLE CATH 300ML
INJECTION | Status: AC
  Filled 2011-12-20: qty 300

## 2011-12-20 MED ORDER — METFORMIN HCL 500 MG PO TABS
1000.0000 mg | ORAL_TABLET | Freq: Two times a day (BID) | ORAL | Status: DC
  Administered 2011-12-20 – 2011-12-21 (×2): 1000 mg via ORAL
  Filled 2011-12-20 (×3): qty 2

## 2011-12-20 MED ORDER — HYDROMORPHONE HCL PF 1 MG/ML IJ SOLN
INTRAMUSCULAR | Status: DC | PRN
  Administered 2011-12-20 (×2): 1 mg via INTRAVENOUS

## 2011-12-20 MED ORDER — CEFAZOLIN SODIUM 1-5 GM-% IV SOLN
1.0000 g | Freq: Four times a day (QID) | INTRAVENOUS | Status: AC
  Administered 2011-12-20 – 2011-12-21 (×3): 1 g via INTRAVENOUS
  Filled 2011-12-20 (×4): qty 50

## 2011-12-20 MED ORDER — SODIUM CHLORIDE 0.9 % IJ SOLN: 9.0000 mL | INTRAMUSCULAR | Status: DC | PRN

## 2011-12-20 MED ORDER — LIDOCAINE HCL 1 % IJ SOLN
INTRAMUSCULAR | Status: DC | PRN
  Administered 2011-12-20: 60 mg via INTRADERMAL

## 2011-12-20 MED ORDER — BENAZEPRIL HCL 20 MG PO TABS
20.0000 mg | ORAL_TABLET | Freq: Every day | ORAL | Status: DC
  Administered 2011-12-21: 20 mg via ORAL
  Filled 2011-12-20: qty 1

## 2011-12-20 MED ORDER — INSULIN ASPART 100 UNIT/ML ~~LOC~~ SOLN
0.0000 [IU] | Freq: Three times a day (TID) | SUBCUTANEOUS | Status: DC
  Administered 2011-12-21 (×2): 2 [IU] via SUBCUTANEOUS
  Administered 2011-12-21: 3 [IU] via SUBCUTANEOUS
  Administered 2011-12-22 – 2011-12-23 (×4): 2 [IU] via SUBCUTANEOUS
  Filled 2011-12-20: qty 3

## 2011-12-20 MED ORDER — MIDAZOLAM HCL 5 MG/5ML IJ SOLN
INTRAMUSCULAR | Status: DC | PRN
  Administered 2011-12-20: 2 mg via INTRAVENOUS

## 2011-12-20 MED ORDER — ONDANSETRON HCL 4 MG PO TABS
4.0000 mg | ORAL_TABLET | Freq: Four times a day (QID) | ORAL | Status: DC | PRN
  Filled 2011-12-20: qty 1

## 2011-12-20 SURGICAL SUPPLY — 52 items
BAG ZIPLOCK 12X15 (MISCELLANEOUS) ×2 IMPLANT
BANDAGE ELASTIC 6 VELCRO ST LF (GAUZE/BANDAGES/DRESSINGS) ×2 IMPLANT
BANDAGE ESMARK 6X9 LF (GAUZE/BANDAGES/DRESSINGS) ×1 IMPLANT
BLADE SAG 18X100X1.27 (BLADE) ×2 IMPLANT
BLADE SAW SGTL 11.0X1.19X90.0M (BLADE) ×2 IMPLANT
BNDG ESMARK 6X9 LF (GAUZE/BANDAGES/DRESSINGS) ×2
BOWL SMART MIX CTS (DISPOSABLE) ×2 IMPLANT
CATH KIT ON-Q SILVERSOAK 5IN (CATHETERS) ×2 IMPLANT
CEMENT HV SMART SET (Cement) ×4 IMPLANT
CLOTH BEACON ORANGE TIMEOUT ST (SAFETY) ×2 IMPLANT
CUFF TOURN SGL QUICK 34 (TOURNIQUET CUFF) ×1
CUFF TRNQT CYL 34X4X40X1 (TOURNIQUET CUFF) ×1 IMPLANT
DRAPE EXTREMITY T 121X128X90 (DRAPE) ×2 IMPLANT
DRAPE POUCH INSTRU U-SHP 10X18 (DRAPES) ×2 IMPLANT
DRAPE U-SHAPE 47X51 STRL (DRAPES) ×2 IMPLANT
DRSG ADAPTIC 3X8 NADH LF (GAUZE/BANDAGES/DRESSINGS) ×2 IMPLANT
DRSG EMULSION OIL 3X16 NADH (GAUZE/BANDAGES/DRESSINGS) ×2 IMPLANT
DRSG PAD ABDOMINAL 8X10 ST (GAUZE/BANDAGES/DRESSINGS) ×2 IMPLANT
DURAPREP 26ML APPLICATOR (WOUND CARE) ×2 IMPLANT
ELECT REM PT RETURN 9FT ADLT (ELECTROSURGICAL) ×2
ELECTRODE REM PT RTRN 9FT ADLT (ELECTROSURGICAL) ×1 IMPLANT
EVACUATOR 1/8 PVC DRAIN (DRAIN) ×2 IMPLANT
FACESHIELD LNG OPTICON STERILE (SAFETY) ×10 IMPLANT
GLOVE BIO SURGEON STRL SZ7.5 (GLOVE) ×2 IMPLANT
GLOVE BIO SURGEON STRL SZ8 (GLOVE) ×2 IMPLANT
GLOVE BIOGEL PI IND STRL 8 (GLOVE) ×2 IMPLANT
GLOVE BIOGEL PI INDICATOR 8 (GLOVE) ×2
GOWN STRL NON-REIN LRG LVL3 (GOWN DISPOSABLE) ×2 IMPLANT
GOWN STRL REIN XL XLG (GOWN DISPOSABLE) ×2 IMPLANT
HANDPIECE INTERPULSE COAX TIP (DISPOSABLE) ×1
IMMOBILIZER KNEE 20 (SOFTGOODS) ×2
IMMOBILIZER KNEE 20 THIGH 36 (SOFTGOODS) ×1 IMPLANT
KIT BASIN OR (CUSTOM PROCEDURE TRAY) ×2 IMPLANT
MANIFOLD NEPTUNE II (INSTRUMENTS) ×2 IMPLANT
NS IRRIG 1000ML POUR BTL (IV SOLUTION) ×2 IMPLANT
PACK TOTAL JOINT (CUSTOM PROCEDURE TRAY) ×2 IMPLANT
PAD ABD 7.5X8 STRL (GAUZE/BANDAGES/DRESSINGS) ×2 IMPLANT
PADDING CAST COTTON 6X4 STRL (CAST SUPPLIES) ×6 IMPLANT
PADDING WEBRIL 6 STERILE (GAUZE/BANDAGES/DRESSINGS) ×2 IMPLANT
POSITIONER SURGICAL ARM (MISCELLANEOUS) ×2 IMPLANT
SET HNDPC FAN SPRY TIP SCT (DISPOSABLE) ×1 IMPLANT
SPONGE GAUZE 4X4 12PLY (GAUZE/BANDAGES/DRESSINGS) ×2 IMPLANT
STRIP CLOSURE SKIN 1/2X4 (GAUZE/BANDAGES/DRESSINGS) ×4 IMPLANT
SUCTION FRAZIER 12FR DISP (SUCTIONS) ×2 IMPLANT
SUT MNCRL AB 4-0 PS2 18 (SUTURE) ×2 IMPLANT
SUT PDS AB 1 CT1 27 (SUTURE) ×6 IMPLANT
SUT VIC AB 2-0 CT1 27 (SUTURE) ×3
SUT VIC AB 2-0 CT1 TAPERPNT 27 (SUTURE) ×3 IMPLANT
TOWEL OR 17X26 10 PK STRL BLUE (TOWEL DISPOSABLE) ×4 IMPLANT
TRAY FOLEY CATH 14FRSI W/METER (CATHETERS) ×2 IMPLANT
WATER STERILE IRR 1500ML POUR (IV SOLUTION) ×2 IMPLANT
WRAP KNEE MAXI GEL POST OP (GAUZE/BANDAGES/DRESSINGS) ×4 IMPLANT

## 2011-12-20 NOTE — Progress Notes (Signed)
300 cc LR IVB given as ordered by Dr. Delma Post

## 2011-12-20 NOTE — H&P (View-Only) (Signed)
Brenda [name]  DOB: 1951-02-22 Single / Language: English / Race: White / Female   Date of Admission:  12/20/2011  Chief complaint:  Let knee pain  History of Present Illness The patient is a 61 year old female who comes in today for a preoperative History and Physical. The patient is scheduled for a left total knee arthroplasty to be performed by Dr. Dione Plover. Aluisio, MD at [facility] on 12/20/2011. The patient is a 61 year old female who presents for her left knee. The patient is being followed for their left knee pain and osteoarthritis. Symptoms reported today include: pain, stiffness, grinding and instability. The patient feels that they are doing poorly and report their pain level to be moderate. Current treatment includes: pain medications to include taking one hydrocodone with two Aleve every morning and one more hydrocodone 8 hours later. The patient has reported improvement of their symptoms with Cortisone injections that usually helped for about 6 weeks. Unfortunately, the pain contim=nues to be progressive in nature. They have been treated conservatively in the past for the above stated problem and despite conservative measures, they continue to have progressive pain and severe functional limitations and dysfunction. They have failed non-operative management including home exercise, medications, and injections. It is felt that they would benefit from undergoing total joint replacement. Risks and benefits of the procedure have been discussed with the patient and they elect to proceed with surgery. There are no active contraindications to surgery such as ongoing infection or rapidly progressive neurological disease.  Problem List/Past Medical Anxiety Disorder Depression Diabetes Mellitus, Type II Gastroesophageal Reflux Disease Heart murmur High blood pressure Hypercholesterolemia Tinnitus Hypertension Cancer.  Endometrial Measles Rubella Menopause  Allergies No Known Drug Allergies BETA BLOCKERS. 07/25/2009 Intolerance - Slow heart rate (Marked as Inactive)  Family History Diabetes Mellitus. mother and brother Chronic Obstructive Lung Disease. father Liver Disease, Chronic. brother Hypertension. mother, brother and grandmother mothers side Cancer. mother and father Bleeding disorder. brother Osteoarthritis. mother, father and grandmother mothers side  Social History Exercise. Exercises rarely Living situation. live alone Alcohol use. never consumed alcohol Marital status. single Number of flights of stairs before winded. 2-3 Tobacco use. never smoker Tobacco / smoke exposure. no Post-Surgical Plans. Plan is for Home  Medication History ClonazePAM (0.5MG Tablet Disperse, Oral) Active. Amlodipine Besy-Benazepril HCl (10-20MG Capsule, Oral) Active. Janumet (50-1000MG Tablet, Oral) Active. Vytorin (10-40MG Tablet, Oral) Active. BuPROPion HCl (XL) (300MG Tablet ER 24HR, Oral) Active. Lovaza (1GM Capsule, Oral) Active. Prevacid (15MG Capsule DR, Oral) Active. Calcium Gluconate (500MG Tablet, Oral) Active. Vitamin D (1000UNIT Tablet, Oral) Active. Aspirin Adult Low Strength (81MG Tablet DR, Oral) Active. GlipiZIDE (5MG Tablet ER 24HR, Oral) Active. Hydrochlorothiazide (25MG Tablet, Oral) Active. Potassium Chloride (10MEQ Capsule ER, Oral) Active. Lasix (20MG Tablet, Oral) Active.  Past Surgical History Arthroscopic Knee Surgery - Left. 01/27/11 Hysterectomy. complete (cancerous)  Review of Systems General:Present- Fatigue. Not Present- Chills, Fever, Night Sweats, Weight Gain, Weight Loss and Memory Loss. Skin:Not Present- Hives, Itching, Rash, Eczema and Lesions. HEENT:Present- Tinnitus. Not Present- Headache, Double Vision, Visual Loss, Hearing Loss and Dentures. Respiratory:Not Present- Shortness of breath with exertion, Shortness of breath at rest,  Allergies, Coughing up blood and Chronic Cough. Cardiovascular:Not Present- Chest Pain, Racing/skipping heartbeats, Difficulty Breathing Lying Down, Murmur, Swelling and Palpitations. Gastrointestinal:Present- Heartburn. Not Present- Bloody Stool, Abdominal Pain, Vomiting, Nausea, Constipation, Diarrhea, Difficulty Swallowing, Jaundice and Loss of appetitie. Female Genitourinary:Not Present- Blood in Urine, Urinary frequency, Weak urinary stream, Discharge, Flank Pain, Incontinence,  Painful Urination, Urgency, Urinary Retention and Urinating at Night. Musculoskeletal:Present- Joint Swelling, Joint Pain and Morning Stiffness. Not Present- Muscle Weakness, Muscle Pain, Back Pain and Spasms. Neurological:Not Present- Tremor, Dizziness, Blackout spells, Paralysis, Difficulty with balance and Weakness. Psychiatric:Not Present- Insomnia.   Vitals Weight: 209 lb Height: 60.5 in Body Surface Area: 2.01 m Body Mass Index: 40.15 kg/m Pulse: 109 (Regular) Resp.: 16 (Unlabored) BP: 143/90 (Sitting, Right Arm, Standard)  Physical Exam The physical exam findings are as follows:  Patient is a 61 year old female with continued Knee pain.   General Mental Status - Alert, cooperative and good historian. General Appearance- pleasant. Not in acute distress. Orientation- Oriented X3. Build & Nutrition- Overweight, Well nourished and Well developed.   Head and Neck Head- normocephalic, atraumatic . Neck Global Assessment- supple. no bruit auscultated on the right and no bruit auscultated on the left.   Eye Pupil- Bilateral- Regular and Round. Note: Wears glasses Motion- Bilateral- EOMI.   Chest and Lung Exam Auscultation: Breath sounds:- clear at anterior chest wall and - clear at posterior chest wall. Adventitious sounds:- No Adventitious sounds.   Cardiovascular Auscultation:Rhythm- Regular rate and rhythm. Heart Sounds- S1 WNL and S2 WNL. Murmurs &  Other Heart Sounds:Auscultation of the heart reveals - No Murmurs.   Abdomen Palpation/Percussion:Tenderness- Abdomen is non-tender to palpation. Rigidity (guarding)- Abdomen is soft. Auscultation:Auscultation of the abdomen reveals - Bowel sounds normal.   Female Genitourinary Not done, not pertinent to present illness  Musculoskeletal Well developed female, alert and oriented in no apparent distress. There is a moderate sized effusion, left knee. She has about 2+ edema in both lower legs. The left side is a little worse than the right. Homans sign is negative. No instability about the knee.  Assessment and Plan Osteoarthritis Left Knee  Patient is for a Left Total Knee Arthroplasty by Dr. Wynelle Link.  Plan is to go home after the surgery.  PCP - Dr. Arnette Norris Cards - Dr. Deretha Emory, PA-C

## 2011-12-20 NOTE — Interval H&P Note (Signed)
History and Physical Interval Note:  12/20/2011 6:57 AM  Vertell Limber  has presented today for surgery, with the diagnosis of Osteoarthritis of the Left Knee  The various methods of treatment have been discussed with the patient and family. After consideration of risks, benefits and other options for treatment, the patient has consented to  Procedure(s) (LRB): TOTAL KNEE ARTHROPLASTY (Left) as a surgical intervention .  The patients' history has been reviewed, patient examined, no change in status, stable for surgery.  I have reviewed the patients' chart and labs.  Questions were answered to the patient's satisfaction.     Gearlean Alf

## 2011-12-20 NOTE — Anesthesia Postprocedure Evaluation (Signed)
  Anesthesia Post-op Note  Patient: Brenda [name]  Procedure(s) Performed: Procedure(s) (LRB): TOTAL KNEE ARTHROPLASTY (Left)  Patient Location: PACU  Anesthesia Type: General  Level of Consciousness: awake and alert   Airway and Oxygen Therapy: Patient Spontanous Breathing  Post-op Pain: mild  Post-op Assessment: Post-op Vital signs reviewed, Patient's Cardiovascular Status Stable, Respiratory Function Stable, Patent Airway and No signs of Nausea or vomiting  Post-op Vital Signs: stable  Complications: No apparent anesthesia complications

## 2011-12-20 NOTE — Anesthesia Procedure Notes (Addendum)
Procedure Name: Intubation Date/Time: 12/20/2011 7:27 AM Performed by: Frances Furbish Pre-anesthesia Checklist: Patient identified, Emergency Drugs available, Suction available and Timeout performed Patient Re-evaluated:Patient Re-evaluated prior to inductionOxygen Delivery Method: Circle System Utilized Preoxygenation: Pre-oxygenation with 100% oxygen Intubation Type: IV induction Ventilation: Mask ventilation without difficulty Laryngoscope Size: Mac and 3 Grade View: Grade I Tube type: Oral Tube size: 8.0 mm Number of attempts: 1 Placement Confirmation: ETT inserted through vocal cords under direct vision Secured at: 2 cm   Spinal  Patient location during procedure: OR Staffing Anesthesiologist: Salley Scarlet Performed by: anesthesiologist  Preanesthetic Checklist Completed: patient identified, site marked, surgical consent, pre-op evaluation, timeout performed, IV checked, risks and benefits discussed and monitors and equipment checked Spinal Block Patient position: sitting Prep: Betadine Patient monitoring: heart rate, continuous pulse ox and blood pressure Injection technique: single-shot Needle Needle type: Sprotte  Needle gauge: 24 G Needle length: 9 cm Additional Notes Expiration date of kit checked and confirmed. Unable to obtain CSF after three attempts. Patient tolerated OK. Converted to General anesthesia.

## 2011-12-20 NOTE — Transfer of Care (Signed)
Immediate Anesthesia Transfer of Care Note  Patient: Brenda [name]  Procedure(s) Performed: Procedure(s) (LRB): TOTAL KNEE ARTHROPLASTY (Left)  Patient Location: PACU  Anesthesia Type: General  Level of Consciousness: awake, alert  and oriented  Airway & Oxygen Therapy: Patient Spontanous Breathing and Patient connected to face mask oxygen  Post-op Assessment: Report given to PACU RN  Post vital signs: Reviewed and stable  Complications: No apparent anesthesia complications

## 2011-12-20 NOTE — Addendum Note (Signed)
Addendum  created 12/20/11 1050 by Salley Scarlet, MD   Modules edited:Orders

## 2011-12-20 NOTE — Op Note (Signed)
Pre-operative diagnosis- Osteoarthritis  Left knee(s)  Post-operative diagnosis- Osteoarthritis Left knee(s)  Procedure-  Left  Total Knee Arthroplasty  Surgeon- Dione Plover. Gadiel John, MD  Assistant- Amber constable, PA-C   Anesthesia-  General EBL-* No blood loss amount entered *  Drains Hemovac  Tourniquet time- 38 minutes @ 175 mmHG Complications- None  Condition-PACU - hemodynamically stable.   Brief Clinical Note  Brenda [name] is a 61 y.o. year old female with end stage OA of her left knee with progressively worsening pain and dysfunction. She has constant pain, with activity and at rest and significant functional deficits with difficulties even with ADLs. She has had extensive non-op management including analgesics, injections of cortisone and viscosupplements, and home exercise program, but remains in significant pain with significant dysfunction. Radiographs show bone on bone arthritis. She presents now for left Total Knee Arthroplasty.    Procedure in detail---   The patient is brought into the operating room and positioned supine on the operating table. After successful administration of  General,   a tourniquet is placed high on the  Left thigh(s) and the lower extremity is prepped and draped in the usual sterile fashion. Time out is performed by the operating team and then the  Left lower extremity is wrapped in Esmarch, knee flexed and the tourniquet inflated to 300 mmHg.       A midline incision is made with a ten blade through the subcutaneous tissue to the level of the extensor mechanism. A fresh blade is used to make a medial parapatellar arthrotomy. Soft tissue over the proximal medial tibia is subperiosteally elevated to the joint line with a knife and into the semimembranosus bursa with a Cobb elevator. Soft tissue over the proximal lateral tibia is elevated with attention being paid to avoiding the patellar tendon on the tibial tubercle. The patella is everted, knee  flexed 90 degrees and the ACL and PCL are removed. Findings are bone on bone medial and patellofemoral with an area of osteonecrosis of the medial femoral condyle.        The drill is used to create a starting hole in the distal femur and the canal is thoroughly irrigated with sterile saline to remove the fatty contents. The 5 degree Left  valgus alignment guide is placed into the femoral canal and the distal femoral cutting block is pinned to remove 10 mm off the distal femur. Resection is made with an oscillating saw.      The tibia is subluxed forward and the menisci are removed. The extramedullary alignment guide is placed referencing proximally at the medial aspect of the tibial tubercle and distally along the second metatarsal axis and tibial crest. The block is pinned to remove 28m off the more deficient medial  side. Resection is made with an oscillating saw. Size 2.5is the most appropriate size for the tibia and the proximal tibia is prepared with the modular drill and keel punch for that size.      The femoral sizing guide is placed and size 3 is most appropriate. Rotation is marked off the epicondylar axis and confirmed by creating a rectangular flexion gap at 90 degrees. The size 3 cutting block is pinned in this rotation and the anterior, posterior and chamfer cuts are made with the oscillating saw. The intercondylar block is then placed and that cut is made.      Trial size 2.5 tibial component, trial size 3 posterior stabilized femur and a 12.5  mm posterior stabilized rotating  platform insert trial is placed. Full extension is achieved with excellent varus/valgus and anterior/posterior balance throughout full range of motion. The patella is everted and thickness measured to be 21  mm. Free hand resection is taken to 12 mm, a 35 template is placed, lug holes are drilled, trial patella is placed, and it tracks normally. Osteophytes are removed off the posterior femur with the trial in place. All  trials are removed and the cut bone surfaces prepared with pulsatile lavage. Cement is mixed and once ready for implantation, the size 2.5 tibial implant, size  3 posterior stabilized femoral component, and the size 35 patella are cemented in place and the patella is held with the clamp. The trial insert is placed and the knee held in full extension. All extruded cement is removed and once the cement is hard the permanent 12.5 mm posterior stabilized rotating platform insert is placed into the tibial tray.      The wound is copiously irrigated with saline solution and the extensor mechanism closed over a hemovac drain with #1 PDS suture. The tourniquet is released for a total tourniquet time of 38  minutes. Flexion against gravity is 140 degrees and the patella tracks normally. Subcutaneous tissue is closed with 2.0 vicryl and subcuticular with running 4.0 Monocryl. The catheter for the Marcaine pain pump is placed and the pump is initiated. The incision is cleaned and dried and steri-strips and a bulky sterile dressing are applied. The limb is placed into a knee immobilizer and the patient is awakened and transported to recovery in stable condition.      Please note that a surgical assistant was a medical necessity for this procedure in order to perform it in a safe and expeditious manner. Surgical assistant was necessary to retract the ligaments and vital neurovascular structures to prevent injury to them and also necessary for proper positioning of the limb to allow for anatomic placement of the prosthesis.   Dione Plover Dvon Jiles, MD    12/20/2011, 8:36 AM

## 2011-12-21 DIAGNOSIS — E871 Hypo-osmolality and hyponatremia: Secondary | ICD-10-CM | POA: Diagnosis not present

## 2011-12-21 LAB — CBC
HCT: 32.7 % — ABNORMAL LOW (ref 36.0–46.0)
MCHC: 32.7 g/dL (ref 30.0–36.0)
RDW: 12.8 % (ref 11.5–15.5)

## 2011-12-21 LAB — BASIC METABOLIC PANEL
BUN: 10 mg/dL (ref 6–23)
Creatinine, Ser: 0.72 mg/dL (ref 0.50–1.10)
GFR calc Af Amer: >90 mL/min (ref >90)
GFR calc non Af Amer: >90 mL/min (ref >90)
Potassium: 3.9 mEq/L (ref 3.5–5.1)

## 2011-12-21 MED ORDER — NON FORMULARY: 15.0000 mg | Freq: Every day | Status: DC

## 2011-12-21 MED ORDER — LANSOPRAZOLE 15 MG PO CPDR
15.0000 mg | DELAYED_RELEASE_CAPSULE | Freq: Every day | ORAL | Status: DC
  Administered 2011-12-22 – 2011-12-23 (×2): 15 mg via ORAL
  Filled 2011-12-21 (×5): qty 1

## 2011-12-21 NOTE — Progress Notes (Signed)
Physical Therapy Treatment Patient Details Name: OLANDA BOUGHNER MRN: [id number] DOB: October 13, 1951 Today's Date: 12/21/2011  PT Assessment/Plan  PT - Assessment/Plan Comments on Treatment Session: Pt limited by L knee pain.  Agreed to perform exercises in supine.  Deferred OOB 2* pain level. PT Plan: Discharge plan remains appropriate;Frequency remains appropriate Follow Up Recommendations: Home health PT Equipment Recommended: None recommended by PT PT Goals  Acute Rehab PT Goals PT Goal: Perform Home Exercise Program - Progress: Progressing toward goal  PT Treatment Precautions/Restrictions  Precautions Precautions: Knee Required Braces or Orthoses: Yes Knee Immobilizer: Discontinue once straight leg raise with < 10 degree lag Restrictions Weight Bearing Restrictions: No Mobility (including Balance)      Exercise  Total Joint Exercises Ankle Circles/Pumps: AROM;Supine;20 reps;Both Quad Sets: Both;20 reps;Supine Short Arc Quad: AAROM;Strengthening;Left;10 reps;Supine Heel Slides: AAROM;Left;10 reps;Supine Hip ABduction/ADduction: AAROM;Strengthening;Left;10 reps;Supine End of Session PT - End of Session Equipment Utilized During Treatment: Left lower extremity prosthesis Activity Tolerance: Patient limited by pain Patient left: in bed;with call bell in reach General Behavior During Session: [facility] for tasks performed Cognition: [facility] for tasks performed  Lizanne Erker,KATHrine E 12/21/2011, 3:59 PM Pager: [phone]

## 2011-12-21 NOTE — Progress Notes (Signed)
Subjective: 1 Day Post-Op Procedure(s) (LRB): TOTAL KNEE ARTHROPLASTY (Left) Patient reports pain as mild and moderate.   Patient seen in rounds with Dr. Wynelle Link. Patient has complaints of tough night We will start therapy today. Plan is to go home after hospital stay.  Objective: Vital signs in last 24 hours: Temp:  [97 F (36.1 C)-99 F (37.2 C)] 97.7 F (36.5 C) (02/19 0822) Pulse Rate:  [88-100] 95  (02/19 0822) Resp:  [12-20] 18  (02/19 0857) BP: (90-137)/(51-79) 108/64 mmHg (02/19 0822) SpO2:  [91 %-98 %] 97 % (02/19 0857) Weight:  [93.895 kg (207 lb)] 93.895 kg (207 lb) (02/18 1130)  Intake/Output from previous day:  Intake/Output Summary (Last 24 hours) at 12/21/11 0932 Last data filed at 12/21/11 0800  Gross per 24 hour  Intake 3781.25 ml  Output   2150 ml  Net 1631.25 ml    Intake/Output this shift: Total I/O In: 360 [P.O.:360] Out: 500 [Urine:500]  Labs:  [facility] 12/21/11 0401  HGB 10.7*    Basename 12/21/11 0401  WBC 10.3  RBC 3.77*  HCT 32.7*  PLT 231    Basename 12/21/11 0401  NA 134*  K 3.9  CL 98  CO2 29  BUN 10  CREATININE 0.72  GLUCOSE 153*  CALCIUM 8.5   No results found for this basename: LABPT:2,INR:2 in the last 72 hours  Exam - Neurovascular intact Sensation intact distally Dressing - clean, dry, no drainage Motor function intact - moving foot and toes well on exam.  Hemovac pulled without difficulty.  Past Medical History  Diagnosis Date  . Depression   . Diabetes mellitus   . GERD (gastroesophageal reflux disease)   . Hyperlipidemia   . Hypertension   . Heart murmur   . Endometrial ca     1998  . Heel spur   . PONV (postoperative nausea and vomiting)     after hysterectomy    Assessment/Plan: 1 Day Post-Op Procedure(s) (LRB): TOTAL KNEE ARTHROPLASTY (Left) Active Problems:  Postop Hyponatremia  DM - Glucotrol, Janumet (sitagliptan/metformin) GERD - Prevacid 61m HTN - lasix Hyperlipidemia -  Vytorin  Advance diet Up with therapy Continue foley due to strict I&O and urinary output monitoring Discharge home with home health  DVT Prophylaxis - Xarelto  Protocol Weight-Bearing as tolerated to left leg Keep foley until tomorrow. No vaccines. D/C PCA, Change to IV push D/C O2 and Pulse OX and try on [address] PMickel Crow2/19/2013, 9:32 AM

## 2011-12-21 NOTE — Evaluation (Signed)
Physical Therapy Evaluation Patient Details Name: Brenda [name] MRN: [id number] DOB: 06/15/1951 Today's Date: 12/21/2011  Problem List:  Patient Active Problem List  Diagnoses  . DIABETES MELLITUS, TYPE II  . HYPERLIPIDEMIA  . DEPRESSION  . HYPERTENSION  . GERD  . Abdominal  pain, other specified site  . OA (osteoarthritis)  . Pre-operative clearance  . Murmur, cardiac  . Postop Hyponatremia    Past Medical History:  Past Medical History  Diagnosis Date  . Depression   . Diabetes mellitus   . GERD (gastroesophageal reflux disease)   . Hyperlipidemia   . Hypertension   . Heart murmur   . Endometrial ca     1998  . Heel spur   . PONV (postoperative nausea and vomiting)     after hysterectomy   Past Surgical History:  Past Surgical History  Procedure Date  . Abdominal hysterectomy 1998  . Left knee arthroscopy 12/2010    PT Assessment/Plan/Recommendation PT Assessment Clinical Impression Statement: Pt c/o severe pain, has been premedicated. Expect good progress. PT Recommendation/Assessment: Patient will need skilled PT in the acute care venue PT Problem List: Decreased strength;Decreased range of motion;Decreased activity tolerance;Decreased mobility;Decreased knowledge of use of DME;Pain Barriers to Discharge: None PT Therapy Diagnosis : Difficulty walking;Acute pain PT Plan PT Frequency: 7X/week PT Treatment/Interventions: DME instruction;Gait training;Stair training;Therapeutic activities;Therapeutic exercise;Patient/family education PT Recommendation Recommendations for Other Services: OT consult Follow Up Recommendations: Home health PT Equipment Recommended: None recommended by PT PT Goals  Acute Rehab PT Goals PT Goal Formulation: With patient Time For Goal Achievement: 5 days Pt will go Supine/Side to Sit: with modified independence PT Goal: Supine/Side to Sit - Progress: Goal set today Pt will go Sit to Stand: with modified independence PT  Goal: Sit to Stand - Progress: Goal set today Pt will Ambulate: 51 - 150 feet;with modified independence;with rolling walker PT Goal: Ambulate - Progress: Goal set today Pt will Go Up / Down Stairs: 3-5 stairs;with min assist;with least restrictive assistive device PT Goal: Up/Down Stairs - Progress: Goal set today Pt will Perform Home Exercise Program: with min assist PT Goal: Perform Home Exercise Program - Progress: Goal set today  PT Evaluation Precautions/Restrictions  Precautions Precautions: Knee Required Braces or Orthoses: Yes Knee Immobilizer: Discontinue once straight leg raise with < 10 degree lag Restrictions Weight Bearing Restrictions: No Prior Functioning  Home Living Lives With: Alone (friend to stay with pt for 1 week after hospital DC) Receives Help From: Friend(s) Home Layout: One level Home Access: Stairs to enter Entrance Stairs-Rails: Right Entrance Stairs-Number of Steps: 4 Home Adaptive Equipment: Walker - rolling;Straight cane;Reacher;Grab bars around toilet Prior Function Level of Independence: Requires assistive device for independence;Independent with basic ADLs;Independent with homemaking with ambulation;Independent with gait (used SPC or RW for ambulation) Able to Take Stairs?: Yes Cognition Cognition Arousal/Alertness: Awake/alert Overall Cognitive Status: Appears within functional limits for tasks assessed Orientation Level: Oriented X4 Sensation/Coordination Sensation Light Touch: Appears Intact Extremity Assessment RUE Assessment RUE Assessment: Within Functional Limits LUE Assessment LUE Assessment: Within Functional Limits RLE Assessment RLE Assessment: Within Functional Limits LLE Assessment LLE Assessment: Exceptions to [facility] LLE PROM (degrees) Left Knee Flexion 0-140: 30 Mobility (including Balance) Bed Mobility Bed Mobility: Yes Supine to Sit: 3: Mod assist;HOB flat;With rails Supine to Sit Details (indicate cue type and  reason): pt 60% Transfers Transfers: Yes Sit to Stand: 3: Mod assist;From bed;With upper extremity assist Sit to Stand Details (indicate cue type and reason): pt 65%, VCs hand placement  Stand to Sit: 4: Min assist;With armrests;To chair/3-in-1 Stand to Sit Details: VCs hand placement, min A to support LLE Ambulation/Gait Ambulation/Gait: Yes Ambulation/Gait Assistance: 4: Min assist Ambulation/Gait Assistance Details (indicate cue type and reason): VCs hand placement, position in RW, and for flexed neck Ambulation Distance (Feet): 30 Feet Assistive device: Rolling walker Gait Pattern: Step-to pattern    Exercise  Total Joint Exercises Ankle Circles/Pumps: AROM;Both;10 reps;Supine Quad Sets: AROM;Left;5 reps;Supine Heel Slides: AAROM;Left;10 reps;Supine End of Session PT - End of Session Equipment Utilized During Treatment: Gait belt;Left knee immobilizer Activity Tolerance: Patient limited by pain Patient left: in chair;with call bell in reach;with family/visitor present Nurse Communication: Mobility status for transfers;Mobility status for ambulation General Behavior During Session: [facility] for tasks performed Cognition: [facility] for tasks performed  Philomena Doheny 12/21/2011, 11:50 AM [phone]

## 2011-12-22 LAB — CBC
HCT: 30.9 % — ABNORMAL LOW (ref 36.0–46.0)
MCH: 27.9 pg (ref 26.0–34.0)
MCV: 86.1 fL (ref 78.0–100.0)
RBC: 3.59 MIL/uL — ABNORMAL LOW (ref 3.87–5.11)
WBC: 12.6 10*3/uL — ABNORMAL HIGH (ref 4.0–10.5)

## 2011-12-22 LAB — BASIC METABOLIC PANEL
BUN: 10 mg/dL (ref 6–23)
CO2: 30 mEq/L (ref 19–32)
Calcium: 8.6 mg/dL (ref 8.4–10.5)
Chloride: 95 mEq/L — ABNORMAL LOW (ref 96–112)
Creatinine, Ser: 0.9 mg/dL (ref 0.50–1.10)

## 2011-12-22 LAB — GLUCOSE, CAPILLARY: Glucose-Capillary: 142 mg/dL — ABNORMAL HIGH (ref 70–99)

## 2011-12-22 NOTE — Evaluation (Addendum)
Occupational Therapy Evaluation Patient Details Name: Brenda [name] MRN: [id number] DOB: Aug 11, 1951 Today's Date: 12/22/2011  Problem List:  Patient Active Problem List  Diagnoses  . DIABETES MELLITUS, TYPE II  . HYPERLIPIDEMIA  . DEPRESSION  . HYPERTENSION  . GERD  . Abdominal  pain, other specified site  . OA (osteoarthritis)  . Pre-operative clearance  . Murmur, cardiac  . Postop Hyponatremia    Past Medical History:  Past Medical History  Diagnosis Date  . Depression   . Diabetes mellitus   . GERD (gastroesophageal reflux disease)   . Hyperlipidemia   . Hypertension   . Heart murmur   . Endometrial ca     1998  . Heel spur   . PONV (postoperative nausea and vomiting)     after hysterectomy   Past Surgical History:  Past Surgical History  Procedure Date  . Abdominal hysterectomy 1998  . Left knee arthroscopy 12/2010    OT Assessment/Plan/Recommendation OT Assessment Clinical Impression Statement: This 61year old female is s/p L TKA.  She is overall mod to max assist for LB ADLs; transfer not performed secondary to pain.  She will benefit from skilled OT in acute to reach a min A level with ADLs.  She plans to have friend stay 24/7 first week home OT Recommendation/Assessment: Patient will need skilled OT in the acute care venue OT Problem List: Decreased strength;Decreased activity tolerance;Impaired balance (sitting and/or standing);Pain;Decreased knowledge of use of DME or AE OT Therapy Diagnosis : Generalized weakness OT Plan OT Frequency: Min 2X/week OT Treatment/Interventions: Self-care/ADL training;Balance training;Patient/family education;Therapeutic activities OT Recommendation Follow Up Recommendations: Home health OT Equipment Recommended: likely 3:1 commode Individuals Consulted Consulted and Agree with Results and Recommendations: Patient OT Goals Acute Rehab OT Goals OT Goal Formulation: With patient Time For Goal Achievement: 7 days ADL  Goals Pt Will Perform Lower Body Bathing: with min assist;Sit to stand from chair ADL Goal: Lower Body Bathing - Progress: Goal set today Pt Will Perform Lower Body Dressing: with mod assist;with adaptive equipment;Sit to stand from chair ADL Goal: Lower Body Dressing - Progress: Goal set today Pt Will Transfer to Toilet: with min assist;Ambulation;3-in-1 ADL Goal: Toilet Transfer - Progress: Goal set today Pt Will Perform Toileting - Clothing Manipulation: with min assist;Standing ADL Goal: Toileting - Clothing Manipulation - Progress: Goal set today Pt Will Perform Toileting - Hygiene: with min assist;Sit to stand from 3-in-1/toilet ADL Goal: Toileting - Hygiene - Progress: Goal set today Pt Will Perform Tub/Shower Transfer: Shower transfer;with min assist;Ambulation ADL Goal: Clinical cytogeneticist - Progress: Goal set today  OT Evaluation Precautions/Restrictions  Precautions Precautions: Knee Precaution Comments: Pt instructed on KI use and when to D/C Required Braces or Orthoses: Yes Knee Immobilizer: Discontinue once straight leg raise with < 10 degree lag Restrictions Weight Bearing Restrictions: No Other Position/Activity Restrictions: WBAT Prior Functioning Home Living Bathroom Shower/Tub: Walk-in shower Bathroom Toilet: Standard (with grab bars) Prior Function Level of Independence: Requires assistive device for independence;Independent with basic ADLs;Independent with homemaking with ambulation;Independent with gait ADL ADL Grooming: Simulated;Set up Where Assessed - Grooming: Sitting, chair;Supported Upper Body Bathing: Simulated;Set up Where Assessed - Upper Body Bathing: Sit to stand from chair Lower Body Bathing: Simulated;Moderate assistance Where Assessed - Lower Body Bathing: Sit to stand from chair Upper Body Dressing: Simulated;Minimal assistance (iv) Where Assessed - Upper Body Dressing: Supported;Sitting, chair Lower Body Dressing: Simulated;Maximal  assistance Where Assessed - Lower Body Dressing: Sit to stand from chair Toilet Transfer: Not assessed Toileting -  Clothing Manipulation: Simulated;Moderate assistance Where Assessed - Toileting Clothing Manipulation: Sit to stand from 3-in-1 or toilet Toileting - Hygiene: Simulated;Moderate assistance Where Assessed - Toileting Hygiene: Sit to stand from 3-in-1 or toilet Equipment Used: Reacher Ambulation Related to ADLs: sit to stand only with mod A.  Painful L knee and R foot:  min a to maintain balance ADL Comments: educated on reacher and sock aid; pt used sock aid on left foot Vision/Perception  Vision - History Patient Visual Report: No change from baseline Cognition Cognition Overall Cognitive Status: Appears within functional limits for tasks assessed Orientation Level: Oriented X4 Sensation/Coordination   Extremity Assessment RUE Assessment RUE Assessment: Within Functional Limits LUE Assessment LUE Assessment: Within Functional Limits Mobility  Stand to Sit: 1: from chair:  Mod A from chair with arm rests, min a to maintain Exercises End of Session General Behavior During Session: [facility] for tasks performed Cognition: [facility] for tasks performed Lesle Chris, OTR/L [phone] 12/22/2011  Chanetta Moosman 12/22/2011, 1:15 PM

## 2011-12-22 NOTE — Progress Notes (Signed)
Physical Therapy Treatment Patient Details Name: Brenda [name] MRN: [id number] DOB: 25-Apr-1951 Today's Date: 12/22/2011  L TKR POD #2 pm session 14:35 - 15:10 1 gt  1 ta  PT Assessment/Plan  PT - Assessment/Plan Comments on Treatment Session: Pt progressing slowly.  Plans to D/C to home with support from friends as she lives home alone. PT Plan: Discharge plan remains appropriate Follow Up Recommendations: Home health PT Equipment Recommended: None recommended by PT PT Goals  Acute Rehab PT Goals PT Goal Formulation: With patient Pt will go Supine/Side to Sit: with modified independence PT Goal: Supine/Side to Sit - Progress: Progressing toward goal Pt will go Sit to Stand: with modified independence PT Goal: Sit to Stand - Progress: Progressing toward goal Pt will Ambulate: 51 - 150 feet;with modified independence;with rolling walker PT Goal: Ambulate - Progress: Progressing toward goal Pt will Go Up / Down Stairs: 3-5 stairs;with min assist;with least restrictive assistive device PT Goal: Up/Down Stairs - Progress: Not met Pt will Perform Home Exercise Program: with min assist PT Goal: Perform Home Exercise Program - Progress: Progressing toward goal  PT Treatment Precautions/Restrictions  Precautions Precautions: Knee Precaution Comments: Pt instructed on KI use and when to D/C Required Braces or Orthoses: Yes Knee Immobilizer: Discontinue once straight leg raise with < 10 degree lag Restrictions Weight Bearing Restrictions: No Other Position/Activity Restrictions: WBAT Mobility (including Balance) Bed Mobility Bed Mobility: Yes (assisted pt back to bed) Supine to Sit: 3: Mod assist;HOB elevated (Comment degrees) Supine to Sit Details (indicate cue type and reason): Increased time and HOB elevated 45' Sit to Supine: 3: Mod assist Sit to Supine - Details (indicate cue type and reason): max assist to support L LE up on bed and increased time to position nd back  pain Transfers Transfers: Yes Sit to Stand: With upper extremity assist;2: Max assist Sit to Stand Details (indicate cue type and reason): increased time and initial posterior LOB Stand to Sit: 3: Mod assist Stand to Sit Details: increased time Ambulation/Gait Ambulation/Gait: Yes Ambulation/Gait Assistance: 3: Mod assist Ambulation/Gait Assistance Details (indicate cue type and reason): increased time and c/o 7/10 knee pain and 5/10 back pain (from sitting in recliner) Ambulation Distance (Feet): 22 Feet Assistive device: Rolling walker Gait Pattern: Step-to pattern;Decreased stance time - left;Trunk flexed Gait velocity: MAX c/o fatigue Stairs: No Wheelchair Mobility Wheelchair Mobility: No    End of Session PT - End of Session Equipment Utilized During Treatment: Gait belt;Left knee immobilizer Activity Tolerance: Patient limited by pain;Patient limited by fatigue Patient left: in bed;with call bell in reach General Behavior During Session: [facility] for tasks performed Cognition: [facility] for tasks performed  Rica Koyanagi  PTA [facility]  Acute  Rehab Pager     [phone]

## 2011-12-22 NOTE — Progress Notes (Signed)
Subjective: 2 Days Post-Op Procedure(s) (LRB): TOTAL KNEE ARTHROPLASTY (Left) Patient reports pain as moderate.   Patient has complaints of right foot pain and muscle spasms Doing better this AM  Objective: Vital signs in last 24 hours: Temp:  [98.2 F (36.8 C)-99.6 F (37.6 C)] 98.2 F (36.8 C) (02/20 0637) Pulse Rate:  [85-107] 96  (02/20 0637) Resp:  [16-18] 16  (02/20 0637) BP: (101-118)/(59-69) 101/59 mmHg (02/20 0637) SpO2:  [92 %-97 %] 96 % (02/20 0637)  Intake/Output from previous day:  Intake/Output Summary (Last 24 hours) at 12/22/11 0831 Last data filed at 12/22/11 0800  Gross per 24 hour  Intake   2148 ml  Output   2725 ml  Net   -577 ml    Intake/Output this shift: Total I/O In: -  Out: 100 [Urine:100]  Labs:  [facility] 12/22/11 0428 12/21/11 0401  HGB 10.0* 10.7*    Basename 12/22/11 0428 12/21/11 0401  WBC 12.6* 10.3  RBC 3.59* 3.77*  HCT 30.9* 32.7*  PLT 223 231    Basename 12/22/11 0428 12/21/11 0401  NA 133* 134*  K 3.6 3.9  CL 95* 98  CO2 30 29  BUN 10 10  CREATININE 0.90 0.72  GLUCOSE 142* 153*  CALCIUM 8.6 8.5   No results found for this basename: LABPT:2,INR:2 in the last 72 hours  Exam - Neurologically intact Neurovascular intact Incision: dressing C/D/I No cellulitis present Compartment soft Dressing/Incision - clean, dry, no drainage Motor function intact - moving foot and toes well on exam. Right foot not swollen and no point tenderness  Past Medical History  Diagnosis Date  . Depression   . Diabetes mellitus   . GERD (gastroesophageal reflux disease)   . Hyperlipidemia   . Hypertension   . Heart murmur   . Endometrial ca     1998  . Heel spur   . PONV (postoperative nausea and vomiting)     after hysterectomy    Assessment/Plan: 2 Days Post-Op Procedure(s) (LRB): TOTAL KNEE ARTHROPLASTY (Left) Active Problems:  Postop Hyponatremia   Advance diet Up with therapy D/C IV fluids Plan for discharge  tomorrow  DVT Prophylaxis - XareltoProtocol Weight-Bearing as tolerated to left leg  Carime Dinkel V 12/22/2011, 8:31 AM

## 2011-12-22 NOTE — Progress Notes (Signed)
Physical Therapy Treatment Patient Details Name: DETTA [name] MRN: [id number] DOB: Sep 11, 1951 Today's Date: 12/22/2011  L TKR POD #2 am session 10:10 - 10:55 1 ta  1 te  1 gt  PT Assessment/Plan  PT - Assessment/Plan Comments on Treatment Session: Pt states she slep well.  Progressing slowly with mild anxiety/fear with mobility.  Pt plans to D/C to home with support from friend as she lives alone. PT Plan: Discharge plan remains appropriate Follow Up Recommendations: Home health PT Equipment Recommended: None recommended by PT PT Goals  Acute Rehab PT Goals PT Goal Formulation: With patient Pt will go Supine/Side to Sit: with modified independence PT Goal: Supine/Side to Sit - Progress: Progressing toward goal Pt will go Sit to Stand: with modified independence PT Goal: Sit to Stand - Progress: Progressing toward goal Pt will Ambulate: 51 - 150 feet;with modified independence;with rolling walker PT Goal: Ambulate - Progress: Progressing toward goal Pt will Go Up / Down Stairs: 3-5 stairs;with min assist;with least restrictive assistive device PT Goal: Up/Down Stairs - Progress: Progressing toward goal Pt will Perform Home Exercise Program: with min assist PT Goal: Perform Home Exercise Program - Progress: Progressing toward goal  PT Treatment Precautions/Restrictions  Precautions Precautions: Knee Precaution Comments: Pt instructed on KI use and when to D/C Required Braces or Orthoses: Yes Knee Immobilizer: Discontinue once straight leg raise with < 10 degree lag Restrictions Weight Bearing Restrictions: No Other Position/Activity Restrictions: WBAT Mobility (including Balance) Bed Mobility Bed Mobility: Yes Supine to Sit: 3: Mod assist;HOB elevated (Comment degrees) Supine to Sit Details (indicate cue type and reason): Increased time and HOB elevated 45' Transfers Transfers: Yes Sit to Stand: 1: +2 Total assist;From bed;From elevated surface Sit to Stand Details  (indicate cue type and reason): Total assist + 2 pt 65% with 50% VC's on proper technique and hand placement.  Pt demon increased anxiety/fear and required extra time.  Pt c/o mild dizzyness with position change.  BP EOB 117/74.  RA 94%  HR 110 Stand to Sit: 1: +2 Total assist;To chair/3-in-1 Stand to Sit Details: Total assist + 2 pt 75% with increased time and 50% VC's to advance L LE and hand placement prior to sit. Ambulation/Gait Ambulation/Gait: Yes Ambulation/Gait Assistance: 1: +2 Total assist Ambulation/Gait Assistance Details (indicate cue type and reason): Total assist + 2 pt 75% with 50% VC's on proper L LE placement and walker advancement.  Pt also required increased time and chair following for safety.  Limited activity tolerance as chair was brought to pt after amb only 18' Ambulation Distance (Feet): 18 Feet Assistive device: Rolling walker Gait Pattern: Step-to pattern;Decreased stance time - left;Decreased step length - right;Trunk flexed Gait velocity: Pt c/o 5/10 knee pain with amb and mod "dizzyness" Stairs: No Wheelchair Mobility Wheelchair Mobility: No    Exercise  Pt given handout on TKR HEP Total Joint Exercises Ankle Circles/Pumps: AROM;Both;10 reps;Supine Quad Sets: AROM;Both;10 reps;Supine Gluteal Sets: AROM;Both;10 reps;Supine Towel Squeeze: AROM;Both;10 reps;Supine Short Arc Quad: AAROM;Left;10 reps;Supine Heel Slides: AAROM;Left;10 reps;Supine Hip ABduction/ADduction: AAROM;Left;10 reps;Supine Straight Leg Raises: AAROM;Left;10 reps;Supine End of Session PT - End of Session Equipment Utilized During Treatment: Gait belt;Left knee immobilizer Activity Tolerance: Patient limited by fatigue Patient left: in chair;with call bell in reach;Other (comment) (ICE to L knee) General Behavior During Session: [facility] for tasks performed Cognition: [facility] for tasks performed  Rica Koyanagi  PTA [facility]  Acute  Rehab Pager     [phone]

## 2011-12-23 LAB — CBC
HCT: 28.8 % — ABNORMAL LOW (ref 36.0–46.0)
Hemoglobin: 9.6 g/dL — ABNORMAL LOW (ref 12.0–15.0)
MCH: 28.4 pg (ref 26.0–34.0)
MCHC: 33.3 g/dL (ref 30.0–36.0)
MCV: 85.2 fL (ref 78.0–100.0)
RBC: 3.38 MIL/uL — ABNORMAL LOW (ref 3.87–5.11)

## 2011-12-23 LAB — GLUCOSE, CAPILLARY: Glucose-Capillary: 132 mg/dL — ABNORMAL HIGH (ref 70–99)

## 2011-12-23 LAB — BASIC METABOLIC PANEL WITH GFR
BUN: 13 mg/dL (ref 6–23)
CO2: 32 meq/L (ref 19–32)
Calcium: 8.9 mg/dL (ref 8.4–10.5)
Chloride: 95 meq/L — ABNORMAL LOW (ref 96–112)
Creatinine, Ser: 0.79 mg/dL (ref 0.50–1.10)
GFR calc Af Amer: >90 mL/min
GFR calc non Af Amer: 87 mL/min — ABNORMAL LOW
Glucose, Bld: 127 mg/dL — ABNORMAL HIGH (ref 70–99)
Potassium: 3.2 meq/L — ABNORMAL LOW (ref 3.5–5.1)
Sodium: 135 meq/L (ref 135–145)

## 2011-12-23 MED ORDER — METHOCARBAMOL 500 MG PO TABS: 500.0000 mg | ORAL_TABLET | Freq: Four times a day (QID) | ORAL | Status: AC | PRN

## 2011-12-23 MED ORDER — OXYCODONE HCL 5 MG PO TABS: 5.0000 mg | ORAL_TABLET | ORAL | Status: AC | PRN

## 2011-12-23 MED ORDER — RIVAROXABAN 10 MG PO TABS: 10.0000 mg | ORAL_TABLET | Freq: Every day | ORAL | Status: DC

## 2011-12-23 NOTE — Discharge Instructions (Signed)
Knee Rehabilitation, Guidelines Following Surgery Results after knee surgery are often greatly improved when you follow the exercise, range of motion and muscle strengthening exercises prescribed by your doctor. Safety measures are also important to protect the knee from further injury. Any time any of these exercises cause you to have increased pain or swelling in your knee joint, decrease the amount until you are comfortable again and slowly increase them. If you have problems or questions, call your caregiver or physical therapist for advice. HOME CARE INSTRUCTIONS   Remove items at home which could result in a fall. This includes throw rugs or furniture in walking pathways.   Continue medications as instructed.   You may shower or take tub baths when your staples or stitches are removed or as instructed.   Walk using crutches or walker as instructed.   Put weight on your legs and walk as much as is comfortable.   You may resume a sexual relationship in one month or when given the OK by your doctor.   Return to work as instructed by your doctor.   Do not drive a car for 6 weeks or as instructed.   Wear elastic stockings until instructed not to.   Make sure you keep all of your appointments after your operation with all of your doctors and caregivers.  RANGE OF MOTION AND STRENGTHENING EXERCISES Rehabilitation of the knee is important following a knee injury or an operation. After just a few days of immobilization, the muscles of the thigh which control the knee become weakened and shrink (atrophy). Knee exercises are designed to build up the tone and strength of the thigh muscles and to improve knee motion. Often times heat used for twenty to thirty minutes before working out will loosen up your tissues and help with improving the range of motion. These exercises can be done on a training (exercise) mat, on the floor, on a table or on a bed. Use what ever works the best and is most  comfortable for you Knee exercises include:  Leg Lifts - While your knee is still immobilized in a splint or cast, you can do straight leg raises. Lift the leg to 60 degrees, hold for 3 sec, and slowly lower the leg. Repeat 10-20 times 2-3 times daily. Perform this exercise against resistance later as your knee gets better.   Quad and Hamstring Sets - Tighten up the muscle on the front of the thigh (Quad) and hold for 5-10 sec. Repeat this 10-20 times hourly. Hamstring sets are done by pushing the foot backward against an object and holding for 5-10 sec. Repeat as with quad sets.   A rehabilitation program following serious knee injuries can speed recovery and prevent re-injury in the future due to weakened muscles. Contact your doctor or a physical therapist for more information on knee rehabilitation. MAKE SURE YOU:   Understand these instructions.   Will watch your condition.   Will get help right away if you are not doing well or get worse.  Document Released: 10/18/2005 Document Revised: 06/30/2011 Document Reviewed: 04/07/2007 [facility] Patient Information 2012 [place].  Pick up stool softner and laxative for home. Do not submerge incision under water. May shower. Continue to use ice for pain and swelling from surgery.

## 2011-12-23 NOTE — Progress Notes (Signed)
CARE MANAGEMENT NOTE 12/23/2011  Patient:  Brenda [name], Brenda [name]   Account Number:  [id number]  Date Initiated:  12/22/2011  Documentation initiated by:  Sherrin Daisy  Subjective/Objective Assessment:   dx osteoarthritis left knee; total knee replacemnt     Action/Plan:   Cm spoke with patient'. Pt plans to return to her home in [place] [facility] [place] [facility] where nurse firend will be caregiver. Already has RW, reacher and PBR equipped with grab bars   Anticipated DC Date:  12/23/2011   Anticipated DC Plan:  [place]  In-house referral  NA      DC Planning Services  CM consult      [facility] Choice  HOME HEALTH   Choice offered to / List presented to:  C-1 Patient   DME arranged  3-N-1      DME agency  [place] arranged  [place].   Status of service:  Completed, signed off Medicare Important Message given?  NO (If response is "NO", the following Medicare IM given date fields will be blank) Date Medicare IM given:   Date Additional Medicare IM given:    Discharge Disposition:  [place]  Per UR Regulation:    Comments:  12/23/2011 [place] BSN CCM [phone] [id number] HAS BEEN DELIVERED TO PATIENT'S RM. pT FOR DISCHARGE TODAY. aDVANCED hOME CARE NOTIFIED OF D/C PLAN. HH SERVICES TO START TOMORROW.   12/22/2011 Pt would like 3N1. She has chosen [facility] for services . List of [place] agencies placed on shadow chart. [facility] notified and can provide [facility] and DME needs

## 2011-12-23 NOTE — Progress Notes (Signed)
Physical Therapy Treatment Patient Details Name: Brenda [name] MRN: [id number] DOB: 17-Oct-1951 Today's Date: 12/23/2011  L TKR POD #3 11:25 - 12:00 1 gt  1 ta  PT Assessment/Plan  PT - Assessment/Plan Comments on Treatment Session: Pt c/o max fatigue.  Pt plans to D/C to home today. PT Plan: Discharge plan remains appropriate Follow Up Recommendations: Home health PT Equipment Recommended: Other (comment) (3:1 delivered and in room, pt already has a RW) PT Goals  Acute Rehab PT Goals PT Goal Formulation: With patient Pt will go Supine/Side to Sit: with modified independence Pt will go Sit to Stand: with modified independence PT Goal: Sit to Stand - Progress: Partly met Pt will Ambulate: 51 - 150 feet;with modified independence;with rolling walker PT Goal: Ambulate - Progress: Partly met Pt will Go Up / Down Stairs: 3-5 stairs;with min assist;with least restrictive assistive device PT Goal: Up/Down Stairs - Progress: Met Pt will Perform Home Exercise Program: with min assist PT Goal: Perform Home Exercise Program - Progress: Met  PT Treatment Precautions/Restrictions  Precautions Precautions: Knee Precaution Comments: Pt instructed on KI use and when to D/C Required Braces or Orthoses: Yes Knee Immobilizer: Discontinue once straight leg raise with < 10 degree lag Restrictions Weight Bearing Restrictions: No Other Position/Activity Restrictions: WBAT Mobility (including Balance) Bed Mobility Bed Mobility: No (Pt OOB in recliner) Transfers Transfers: Yes Sit to Stand: 4: Min assist;From chair/3-in-1 Sit to Stand Details (indicate cue type and reason): increased time Stand to Sit: Other (comment) (MinGuard assist) Ambulation/Gait Ambulation/Gait: Yes Ambulation/Gait Assistance: Other (comment) (MinGuard assist) Ambulation/Gait Assistance Details (indicate cue type and reason): increased time with c/o 3/10 knee pain and MAX c/o fatigue Ambulation Distance (Feet): 50  Feet Assistive device: Rolling walker Gait Pattern: Step-to pattern;Decreased stance time - left;Trunk flexed;Decreased step length - right Stairs: Yes Stairs Assistance: 4: Min assist Stairs Assistance Details (indicate cue type and reason): 50% VC's on proper sequencing and safety using crutch and one rail Stair Management Technique: One rail Right;With crutches Number of Stairs: 3  Wheelchair Mobility Wheelchair Mobility: No    Exercise    End of Session PT - End of Session Equipment Utilized During Treatment: Gait belt;Left knee immobilizer Activity Tolerance: Patient limited by fatigue Patient left: in chair;with call bell in reach Nurse Communication: Other (comment) (Pt ready to D/C to home) General Behavior During Session: [facility] for tasks performed Cognition: [facility] for tasks performed  Rica Koyanagi  PTA [facility]  Acute  Rehab Pager     [phone]

## 2011-12-23 NOTE — Progress Notes (Signed)
Subjective: 3 Days Post-Op Procedure(s) (LRB): TOTAL KNEE ARTHROPLASTY (Left) Patient reports pain as mild.   Patient seen in rounds by Dr. Wynelle Link. Patient ready to go home.  Objective: Vital signs in last 24 hours: Temp:  [98.1 F (36.7 C)-100.3 F (37.9 C)] 99.2 F (37.3 C) (02/21 0640) Pulse Rate:  [103-119] 103  (02/21 0640) Resp:  [16-20] 20  (02/21 0640) BP: (102-110)/(65-71) 102/65 mmHg (02/21 0640) SpO2:  [90 %-92 %] 91 % (02/21 0640)  Intake/Output from previous day:  Intake/Output Summary (Last 24 hours) at 12/23/11 1019 Last data filed at 12/23/11 0800  Gross per 24 hour  Intake    960 ml  Output   3050 ml  Net  -2090 ml    Intake/Output this shift: Total I/O In: 240 [P.O.:240] Out: -   Labs: Results for orders placed during the hospital encounter of 12/20/11  TYPE AND SCREEN      Component Value Range   ABO/RH(D) O POS     Antibody Screen NEG     Sample Expiration 12/23/2011    GLUCOSE, CAPILLARY      Component Value Range   Glucose-Capillary 152 (*) 70 - 99 (mg/dL)   Comment 1 Documented in Chart    ABO/RH      Component Value Range   ABO/RH(D) O POS    GLUCOSE, CAPILLARY      Component Value Range   Glucose-Capillary 168 (*) 70 - 99 (mg/dL)   Comment 1 Documented in Chart    CBC      Component Value Range   WBC 10.3  4.0 - 10.5 (K/uL)   RBC 3.77 (*) 3.87 - 5.11 (MIL/uL)   Hemoglobin 10.7 (*) 12.0 - 15.0 (g/dL)   HCT 32.7 (*) 36.0 - 46.0 (%)   MCV 86.7  78.0 - 100.0 (fL)   MCH 28.4  26.0 - 34.0 (pg)   MCHC 32.7  30.0 - 36.0 (g/dL)   RDW 12.8  11.5 - 15.5 (%)   Platelets 231  150 - 400 (K/uL)  BASIC METABOLIC PANEL      Component Value Range   Sodium 134 (*) 135 - 145 (mEq/L)   Potassium 3.9  3.5 - 5.1 (mEq/L)   Chloride 98  96 - 112 (mEq/L)   CO2 29  19 - 32 (mEq/L)   Glucose, Bld 153 (*) 70 - 99 (mg/dL)   BUN 10  6 - 23 (mg/dL)   Creatinine, Ser 0.72  0.50 - 1.10 (mg/dL)   Calcium 8.5  8.4 - 10.5 (mg/dL)   GFR calc non Af Amer >90   >90 (mL/min)   GFR calc Af Amer >90  >90 (mL/min)  GLUCOSE, CAPILLARY      Component Value Range   Glucose-Capillary 152 (*) 70 - 99 (mg/dL)  GLUCOSE, CAPILLARY      Component Value Range   Glucose-Capillary 167 (*) 70 - 99 (mg/dL)  GLUCOSE, CAPILLARY      Component Value Range   Glucose-Capillary 132 (*) 70 - 99 (mg/dL)  GLUCOSE, CAPILLARY      Component Value Range   Glucose-Capillary 158 (*) 70 - 99 (mg/dL)  GLUCOSE, CAPILLARY      Component Value Range   Glucose-Capillary 131 (*) 70 - 99 (mg/dL)  CBC      Component Value Range   WBC 12.6 (*) 4.0 - 10.5 (K/uL)   RBC 3.59 (*) 3.87 - 5.11 (MIL/uL)   Hemoglobin 10.0 (*) 12.0 - 15.0 (g/dL)   HCT 30.9 (*)  36.0 - 46.0 (%)   MCV 86.1  78.0 - 100.0 (fL)   MCH 27.9  26.0 - 34.0 (pg)   MCHC 32.4  30.0 - 36.0 (g/dL)   RDW 12.8  11.5 - 15.5 (%)   Platelets 223  150 - 400 (K/uL)  BASIC METABOLIC PANEL      Component Value Range   Sodium 133 (*) 135 - 145 (mEq/L)   Potassium 3.6  3.5 - 5.1 (mEq/L)   Chloride 95 (*) 96 - 112 (mEq/L)   CO2 30  19 - 32 (mEq/L)   Glucose, Bld 142 (*) 70 - 99 (mg/dL)   BUN 10  6 - 23 (mg/dL)   Creatinine, Ser 0.90  0.50 - 1.10 (mg/dL)   Calcium 8.6  8.4 - 10.5 (mg/dL)   GFR calc non Af Amer 67 (*) >90 (mL/min)   GFR calc Af Amer 78 (*) >90 (mL/min)  GLUCOSE, CAPILLARY      Component Value Range   Glucose-Capillary 132 (*) 70 - 99 (mg/dL)  GLUCOSE, CAPILLARY      Component Value Range   Glucose-Capillary 123 (*) 70 - 99 (mg/dL)  CBC      Component Value Range   WBC 9.9  4.0 - 10.5 (K/uL)   RBC 3.38 (*) 3.87 - 5.11 (MIL/uL)   Hemoglobin 9.6 (*) 12.0 - 15.0 (g/dL)   HCT 28.8 (*) 36.0 - 46.0 (%)   MCV 85.2  78.0 - 100.0 (fL)   MCH 28.4  26.0 - 34.0 (pg)   MCHC 33.3  30.0 - 36.0 (g/dL)   RDW 12.7  11.5 - 15.5 (%)   Platelets 188  150 - 400 (K/uL)  BASIC METABOLIC PANEL      Component Value Range   Sodium 135  135 - 145 (mEq/L)   Potassium 3.2 (*) 3.5 - 5.1 (mEq/L)   Chloride 95 (*) 96 - 112  (mEq/L)   CO2 32  19 - 32 (mEq/L)   Glucose, Bld 127 (*) 70 - 99 (mg/dL)   BUN 13  6 - 23 (mg/dL)   Creatinine, Ser 0.79  0.50 - 1.10 (mg/dL)   Calcium 8.9  8.4 - 10.5 (mg/dL)   GFR calc non Af Amer 87 (*) >90 (mL/min)   GFR calc Af Amer >90  >90 (mL/min)  GLUCOSE, CAPILLARY      Component Value Range   Glucose-Capillary 139 (*) 70 - 99 (mg/dL)  GLUCOSE, CAPILLARY      Component Value Range   Glucose-Capillary 142 (*) 70 - 99 (mg/dL)  GLUCOSE, CAPILLARY      Component Value Range   Glucose-Capillary 133 (*) 70 - 99 (mg/dL)  GLUCOSE, CAPILLARY      Component Value Range   Glucose-Capillary 117 (*) 70 - 99 (mg/dL)   Comment 1 Notify RN      Exam: Neurovascular intact Sensation intact distally Incision - clean, dry, no drainage Motor function intact - moving foot and toes well on exam.   Assessment/Plan: 3 Days Post-Op Procedure(s) (LRB): TOTAL KNEE ARTHROPLASTY (Left) Procedure(s) (LRB): TOTAL KNEE ARTHROPLASTY (Left) Past Medical History  Diagnosis Date  . Depression   . Diabetes mellitus   . GERD (gastroesophageal reflux disease)   . Hyperlipidemia   . Hypertension   . Heart murmur   . Endometrial ca     1998  . Heel spur   . PONV (postoperative nausea and vomiting)     after hysterectomy   Active Problems:  Postop Hyponatremia   Up with  therapy Discharge home with home health Diet - carb modified - medium, heart healthy Follow up - in 2 weeks Activity - WBAT Condition Upon Discharge - Good D/C Meds - See DC Summary DVT Prophylaxis - Xarelto Protocol   Brenda [name] 12/23/2011, 10:19 AM

## 2011-12-23 NOTE — Progress Notes (Signed)
Occupational Therapy Treatment Patient Details Name: Brenda [name] MRN: [id number] DOB: Oct 13, 1951 Today's Date: 12/23/2011  OT Assessment/Plan OT Assessment/Plan Comments on Treatment Session: Pt. requires min A for BADLs.  She fatigues quickly.  SHe will require 24 hour assistance at D/C.  Recommend HHOT OT Plan: Discharge plan remains appropriate OT Frequency: Min 2X/week Follow Up Recommendations: Home health OT Equipment Recommended: Other (comment);None recommended by OT (3:1 delivered and in room, pt already has a RW) OT Goals ADL Goals ADL Goal: Lower Body Dressing - Progress: Met ADL Goal: Toilet Transfer - Progress: Met ADL Goal: Toileting - Clothing Manipulation - Progress: Met  OT Treatment Precautions/Restrictions  Precautions Precautions: Knee Precaution Comments: Pt instructed on KI use and when to D/C Required Braces or Orthoses: Yes Knee Immobilizer: Discontinue once straight leg raise with < 10 degree lag Restrictions Weight Bearing Restrictions: No Other Position/Activity Restrictions: WBAT   ADL ADL Lower Body Dressing: Performed;Minimal assistance Lower Body Dressing Details (indicate cue type and reason): Pt. instructed in use and acquisition of AE Where Assessed - Lower Body Dressing: Sit to stand from chair Toilet Transfer: Performed;Minimal assistance Toilet Transfer Method: Counselling psychologist: Comfort height toilet;Grab bars Toileting - Clothing Manipulation: Performed;Minimal assistance Where Assessed - Toileting Clothing Manipulation: Sit to stand from 3-in-1 or toilet Equipment Used: Reacher;Sock aid;Rolling walker Ambulation Related to ADLs: Pt. ambulated to BR with RW and min A.  Pt. very slow, and required min verbal cues for sequencing ADL Comments: Pt. moves very slowly, and fatigues relatively quickly.  Pt. will need AE for home use and was instructed in where to purchase.  She reports her friend will assist her for ~1  wk at D/C (Pt. verbally instruced in shower transfer - HHOT will addres) Mobility  Bed Mobility Bed Mobility: No Transfers Transfers: Yes Sit to Stand: 4: Min assist;From chair/3-in-1 Sit to Stand Details (indicate cue type and reason): increased time Stand to Sit: 4: Min assist Exercises    End of Session OT - End of Session Equipment Utilized During Treatment: Left knee immobilizer Activity Tolerance: Patient limited by fatigue;Patient limited by pain Patient left: in chair;with call bell in reach General Behavior During Session: [facility] for tasks performed Cognition: [facility] for tasks performed  Jaiden Wahab M  12/23/2011, 2:05 PM

## 2011-12-23 NOTE — Progress Notes (Deleted)
Physical Therapy Treatment Patient Details Name: JASEMINE NAWAZ MRN: [id number] DOB: 1951/10/10 Today's Date: 12/23/2011  11:25 - 12:00 L TKR POD #3 1 gt  1 ta  PT Assessment/Plan  PT - Assessment/Plan Comments on Treatment Session: Pt c/o max fatigue.  Pt plans to D/C to home today. PT Plan: Discharge plan remains appropriate Follow Up Recommendations: Home health PT Equipment Recommended: Other (comment) (3:1 delivered and in room, pt already has a RW) PT Goals  Acute Rehab PT Goals PT Goal Formulation: With patient Pt will go Supine/Side to Sit: with modified independence Pt will go Sit to Stand: with modified independence PT Goal: Sit to Stand - Progress: Partly met Pt will Ambulate: 51 - 150 feet;with modified independence;with rolling walker PT Goal: Ambulate - Progress: Partly met Pt will Go Up / Down Stairs: 3-5 stairs;with min assist;with least restrictive assistive device PT Goal: Up/Down Stairs - Progress: Met Pt will Perform Home Exercise Program: with min assist PT Goal: Perform Home Exercise Program - Progress: Met  PT Treatment Precautions/Restrictions  Precautions Precautions: Knee Precaution Comments: Pt instructed on KI use and when to D/C Required Braces or Orthoses: Yes Knee Immobilizer: Discontinue once straight leg raise with < 10 degree lag Restrictions Weight Bearing Restrictions: No Other Position/Activity Restrictions: WBAT Mobility (including Balance) Bed Mobility Bed Mobility: No (Pt OOB in recliner) Transfers Transfers: Yes Sit to Stand: 4: Min assist;From chair/3-in-1 Sit to Stand Details (indicate cue type and reason): increased time Stand to Sit: Other (comment) (MinGuard assist) Ambulation/Gait Ambulation/Gait: Yes Ambulation/Gait Assistance: Other (comment) (MinGuard assist) Ambulation/Gait Assistance Details (indicate cue type and reason): increased time with c/o 3/10 knee pain and MAX c/o fatigue Ambulation Distance (Feet): 50  Feet Assistive device: Rolling walker Gait Pattern: Step-to pattern;Decreased stance time - left;Trunk flexed;Decreased step length - right Stairs: Yes Stairs Assistance: 4: Min assist Stairs Assistance Details (indicate cue type and reason): 50% VC's on proper sequencing and safety using crutch and one rail Stair Management Technique: One rail Right;With crutches Number of Stairs: 3  Wheelchair Mobility Wheelchair Mobility: No    Exercise    End of Session PT - End of Session Equipment Utilized During Treatment: Gait belt;Left knee immobilizer Activity Tolerance: Patient limited by fatigue Patient left: in chair;with call bell in reach Nurse Communication: Other (comment) (Pt ready to D/C to home) General Behavior During Session: [facility] for tasks performed Cognition: [facility] for tasks performed  Rica Koyanagi  PTA [facility]  Acute  Rehab Pager     [phone]

## 2011-12-24 ENCOUNTER — Other Ambulatory Visit: Payer: Self-pay | Admitting: Family Medicine

## 2011-12-24 MED ORDER — CLONAZEPAM 0.5 MG PO TABS: 1.0000 mg | ORAL_TABLET | Freq: Three times a day (TID) | ORAL | Status: DC | PRN

## 2011-12-24 MED ORDER — GLIPIZIDE 5 MG PO TABS: 2.5000 mg | ORAL_TABLET | Freq: Two times a day (BID) | ORAL | Status: DC

## 2011-12-24 NOTE — Telephone Encounter (Signed)
Patient needs amlodipine, clonazepam, and glipizide and the pharmacy told her to call the office if she needed these refills.  Patient is requesting call back at [phone].

## 2011-12-24 NOTE — Telephone Encounter (Signed)
Received fax refill request from patients pharmacy.  Okay to refill?

## 2011-12-24 NOTE — Telephone Encounter (Signed)
Called klonopin to pharmacy.

## 2011-12-24 NOTE — Telephone Encounter (Signed)
Kelly with [facility] called regarding patient and states that patient has a Level 1 interaction with Vytorin and Lotrel medications being both prescribed to patient.  She just wants to make sure you are aware. Any questions, please call her back at [phone]

## 2011-12-24 NOTE — Telephone Encounter (Signed)
Can you refill this one for Dr. Hulen Shouts patient please? Thanks

## 2012-01-03 ENCOUNTER — Encounter (HOSPITAL_COMMUNITY): Payer: Self-pay | Admitting: Orthopedic Surgery

## 2012-01-03 NOTE — Discharge Summary (Signed)
Physician Discharge Summary   Patient ID: Brenda [name] MRN: [id number] DOB/AGE: 1951/01/13 61 y.o.  Admit date: 12/20/2011 Discharge date: 12/23/2011  Primary Diagnosis: Osteoarthritis Left Knee   Admission Diagnoses: Past Medical History  Diagnosis Date  . Depression   . Diabetes mellitus   . GERD (gastroesophageal reflux disease)   . Hyperlipidemia   . Hypertension   . Heart murmur   . Endometrial ca     1998  . Heel spur   . PONV (postoperative nausea and vomiting)     after hysterectomy    Discharge Diagnoses:  Active Problems:  Postop Hyponatremia   Procedure: Procedure(s) (LRB): TOTAL KNEE ARTHROPLASTY (Left)   Consults: None  HPI: Brenda [name] is a 61 y.o. year old female with end stage OA of her left knee with progressively worsening pain and dysfunction. She has constant pain, with activity and at rest and significant functional deficits with difficulties even with ADLs. She has had extensive non-op management including analgesics, injections of cortisone and viscosupplements, and home exercise program, but remains in significant pain with significant dysfunction. Radiographs show bone on bone arthritis. She presents now for left Total Knee Arthroplasty.       Laboratory Data: Hospital Outpatient Visit on 12/15/2011  Component Date Value Range Status  . aPTT (seconds) 12/15/2011 29  24-37 Final  . WBC (K/uL) 12/15/2011 14.0* 4.0-10.5 Final  . RBC (MIL/uL) 12/15/2011 4.94  3.87-5.11 Final  . Hemoglobin (g/dL) 12/15/2011 14.1  12.0-15.0 Final  . HCT (%) 12/15/2011 41.7  36.0-46.0 Final  . MCV (fL) 12/15/2011 84.4  78.0-100.0 Final  . MCH (pg) 12/15/2011 28.5  26.0-34.0 Final  . MCHC (g/dL) 12/15/2011 33.8  30.0-36.0 Final  . RDW (%) 12/15/2011 12.7  11.5-15.5 Final  . Platelets (K/uL) 12/15/2011 292  150-400 Final  . Sodium (mEq/L) 12/15/2011 140  135-145 Final  . Potassium (mEq/L) 12/15/2011 4.0  3.5-5.1 Final  . Chloride (mEq/L) 12/15/2011  99  96-112 Final  . CO2 (mEq/L) 12/15/2011 28  19-32 Final  . Glucose, Bld (mg/dL) 12/15/2011 109* 70-99 Final  . BUN (mg/dL) 12/15/2011 27* 6-23 Final  . Creatinine, Ser (mg/dL) 12/15/2011 1.00  0.50-1.10 Final  . Calcium (mg/dL) 12/15/2011 10.2  8.4-10.5 Final  . Total Protein (g/dL) 12/15/2011 7.2  6.0-8.3 Final  . Albumin (g/dL) 12/15/2011 4.2  3.5-5.2 Final  . AST (U/L) 12/15/2011 22  0-37 Final  . ALT (U/L) 12/15/2011 20  0-35 Final  . Alkaline Phosphatase (U/L) 12/15/2011 100  39-117 Final  . Total Bilirubin (mg/dL) 12/15/2011 0.4  0.3-1.2 Final  . GFR calc non Af Amer (mL/min) 12/15/2011 59* >90 Final  . GFR calc Af Amer (mL/min) 12/15/2011 68* >90 Final   Comment:                                 The eGFR has been calculated                          using the CKD EPI equation.                          This calculation has not been                          validated in all clinical  situations.                          eGFR's persistently                          <90 mL/min signify                          possible Chronic Kidney Disease.  Marland Kitchen Prothrombin Time (seconds) 12/15/2011 12.9  11.6-15.2 Final  . INR  12/15/2011 0.95  0.00-1.49 Final  . Color, Urine  12/15/2011 YELLOW  YELLOW Final  . APPearance  12/15/2011 CLEAR  CLEAR Final  . Specific Gravity, Urine  12/15/2011 1.025  1.005-1.030 Final  . pH  12/15/2011 5.0  5.0-8.0 Final  . Glucose, UA (mg/dL) 12/15/2011 NEGATIVE  NEGATIVE Final  . Hgb urine dipstick  12/15/2011 NEGATIVE  NEGATIVE Final  . Bilirubin Urine  12/15/2011 NEGATIVE  NEGATIVE Final  . Ketones, ur (mg/dL) 12/15/2011 NEGATIVE  NEGATIVE Final  . Protein, ur (mg/dL) 12/15/2011 NEGATIVE  NEGATIVE Final  . Urobilinogen, UA (mg/dL) 12/15/2011 0.2  0.0-1.0 Final  . Nitrite  12/15/2011 NEGATIVE  NEGATIVE Final  . Leukocytes, UA  12/15/2011 NEGATIVE  NEGATIVE Final   MICROSCOPIC NOT DONE ON URINES WITH NEGATIVE PROTEIN, BLOOD, LEUKOCYTES,  NITRITE, OR GLUCOSE <1000 mg/dL.  Marland Kitchen MRSA, PCR  12/15/2011 NEGATIVE  NEGATIVE Final  . Staphylococcus aureus  12/15/2011 NEGATIVE  NEGATIVE Final   Comment:                                 The Xpert SA Assay (FDA                          approved for NASAL specimens                          only), is one component of                          a comprehensive surveillance                          program.  It is not intended                          to diagnose infection nor to                          guide or monitor treatment.   No results found for this basename: HGB:5 in the last 72 hours No results found for this basename: WBC:2,RBC:2,HCT:2,PLT:2 in the last 72 hours No results found for this basename: NA:2,K:2,CL:2,CO2:2,BUN:2,CREATININE:2,GLUCOSE:2,CALCIUM:2 in the last 72 hours No results found for this basename: LABPT:2,INR:2 in the last 72 hours  X-Rays:Dg Chest 2 View  12/15/2011  *RADIOLOGY REPORT*  Clinical Data: Preop radiograph.  CHEST - 2 VIEW  Comparison: 12/15/2011  Findings: The heart size and mediastinal contours are within normal limits.  Both lungs are clear.  The visualized skeletal structures are unremarkable.  IMPRESSION: Negative exam.  Original Report Authenticated By: Angelita Ingles, M.D.    EKG: Orders placed during the hospital encounter of 12/15/11  .  EKG 12-LEAD  . EKG 12-LEAD     Hospital Course: Patient was admitted to [facility] and taken to the OR and underwent the above state procedure without complications.  Patient tolerated the procedure well and was later transferred to the recovery room and then to the orthopaedic floor for postoperative care.  They were given PO and IV analgesics for pain control following their surgery.  They were given 24 hours of postoperative antibiotics and started on DVT prophylaxis.   PT and OT were ordered for total joint protocol.  Discharge planning consulted to help with postop disposition and equipment needs.   Patient had a tough night on the evening of surgery and started to get up with therapy on day one.  PCA was discontinued and they were weaned over to PO meds.  Hemovac drain was pulled without difficulty. Patient had some right foot pain on the evening of day one but was better by day two.  Continued to progress with therapy into day two.  Dressing was changed on day two and the incision was healing well.  By day three, the patient had progressed with therapy and meeting goals.  Incision was healing well.  Patient was seen in rounds and was ready to go home.  Discharge Medications: Prior to Admission medications   Medication Sig Start Date End Date Taking? Authorizing Provider  buPROPion (WELLBUTRIN XL) 300 MG 24 hr tablet Take 300 mg by mouth daily before breakfast.    Yes Historical Provider, MD  ezetimibe-simvastatin (VYTORIN) 10-40 MG per tablet Take 1 tablet by mouth at bedtime.    Yes Historical Provider, MD  furosemide (LASIX) 20 MG tablet Take 20 mg by mouth 2 (two) times daily as needed. Fluid  10/15/11 10/14/12 Yes Minna Merritts, MD  hydrochlorothiazide 25 MG tablet Take 25 mg by mouth daily before breakfast.    Yes Historical Provider, MD  lansoprazole (PREVACID) 15 MG capsule Take 15 mg by mouth daily.    Yes Historical Provider, MD  potassium chloride (K-DUR) 10 MEQ tablet Take 10 mEq by mouth 2 (two) times daily as needed. Fluid  10/15/11 10/14/12 Yes Minna Merritts, MD  sitaGLIPtan-metformin (JANUMET) 50-1000 MG per tablet Take 1 tablet by mouth 2 (two) times daily with a meal. 01/28/11 01/28/12 Yes Arnette Norris, MD  amLODipine-benazepril (LOTREL) 10-20 MG per capsule TAKE 1 CAPSULE BY MOUTH ONCE DAILY 12/24/11   Arnette Norris, MD  clonazePAM (KLONOPIN) 0.5 MG tablet Take 2 tablets (1 mg total) by mouth 3 (three) times daily as needed for anxiety. 12/24/11   Ria Bush, MD  glipiZIDE (GLUCOTROL) 5 MG tablet Take 0.5 tablets (2.5 mg total) by mouth 2 (two) times daily before a meal.  12/24/11   Arnette Norris, MD  methocarbamol (ROBAXIN) 500 MG tablet Take 1 tablet (500 mg total) by mouth every 6 (six) hours as needed. 12/23/11 01/02/12  Leeum Sankey, PA  oxyCODONE (OXY IR/ROXICODONE) 5 MG immediate release tablet Take 1-2 tablets (5-10 mg total) by mouth every 3 (three) hours as needed. 12/23/11 01/02/12  Jericka Kadar Dara Lords, PA  rivaroxaban (XARELTO) 10 MG TABS tablet Take 1 tablet (10 mg total) by mouth daily with breakfast. 12/23/11   Alayssa Flinchum Dara Lords, PA  traMADol (ULTRAM) 50 MG tablet Take 50 mg by mouth every 8 (eight) hours as needed. Pain     Historical Provider, MD    Diet: carb modified - medium  Activity:WBAT  Follow-up:in 2 weeks  Disposition: Home Discharged Condition: good   Discharge  Orders    Future Orders Please Complete By Expires   Diet - low sodium heart healthy      Call MD / Call 911      Comments:   If you experience chest pain or shortness of breath, CALL 911 and be transported to the hospital emergency room.  If you develope a fever above 101 F, pus (white drainage) or increased drainage or redness at the wound, or calf pain, call your surgeon's office.   Constipation Prevention      Comments:   Drink plenty of fluids.  Prune juice may be helpful.  You may use a stool softener, such as Colace (over the counter) 100 mg twice a day.  Use MiraLax (over the counter) for constipation as needed.   Increase activity slowly as tolerated      Weight Bearing as taught in Physical Therapy      Comments:   Use a walker or crutches as instructed.   Discharge instructions      Comments:   Pick up stool softner and laxative for home. Do not submerge incision under water. May shower. Continue to use ice for pain and swelling from surgery.    Driving restrictions      Comments:   No driving   Lifting restrictions      Comments:   No lifting   TED hose      Comments:   Use stockings (TED hose) for 3 weeks on both leg(s).  You may remove them at  night for sleeping.   Change dressing      Comments:   Change dressing daily with sterile 4 x 4 inch gauze dressing and apply TED hose.   Do not put a pillow under the knee. Place it under the heel.        Medication List  As of 01/03/2012  7:59 AM   STOP taking these medications         aspirin 81 MG tablet      CALCIUM 500 1250 MG tablet      cholecalciferol 1000 UNITS tablet      HYDROcodone-acetaminophen 5-325 MG per tablet      naproxen sodium 220 MG tablet      omega-3 acid ethyl esters 1 G capsule         TAKE these medications         buPROPion 300 MG 24 hr tablet   Commonly known as: WELLBUTRIN XL   Take 300 mg by mouth daily before breakfast.      ezetimibe-simvastatin 10-40 MG per tablet   Commonly known as: VYTORIN   Take 1 tablet by mouth at bedtime.      furosemide 20 MG tablet   Commonly known as: LASIX   Take 20 mg by mouth 2 (two) times daily as needed. Fluid        hydrochlorothiazide 25 MG tablet   Commonly known as: HYDRODIURIL   Take 25 mg by mouth daily before breakfast.      lansoprazole 15 MG capsule   Commonly known as: PREVACID   Take 15 mg by mouth daily.      potassium chloride 10 MEQ tablet   Commonly known as: K-DUR   Take 10 mEq by mouth 2 (two) times daily as needed. Fluid        rivaroxaban 10 MG Tabs tablet   Commonly known as: XARELTO   Take 1 tablet (10 mg total) by mouth daily with breakfast.  sitaGLIPtan-metformin 50-1000 MG per tablet   Commonly known as: JANUMET   Take 1 tablet by mouth 2 (two) times daily with a meal.      traMADol 50 MG tablet   Commonly known as: ULTRAM   Take 50 mg by mouth every 8 (eight) hours as needed. Pain             Follow-up Information    Follow up with Gearlean Alf, MD. Schedule an appointment as soon as possible for a visit in 2 weeks.   Contact information:   [facility] [address] [place]  [place] [phone]          Signed: Mickel Crow 01/03/2012, 7:59 AM

## 2012-01-10 ENCOUNTER — Other Ambulatory Visit: Payer: Self-pay | Admitting: Family Medicine

## 2012-01-20 ENCOUNTER — Other Ambulatory Visit: Payer: Self-pay | Admitting: Family Medicine

## 2012-01-20 NOTE — Telephone Encounter (Signed)
[facility].

## 2012-02-07 ENCOUNTER — Other Ambulatory Visit: Payer: Self-pay | Admitting: Family Medicine

## 2012-02-10 ENCOUNTER — Encounter: Payer: Self-pay | Admitting: Family Medicine

## 2012-02-10 ENCOUNTER — Ambulatory Visit (INDEPENDENT_AMBULATORY_CARE_PROVIDER_SITE_OTHER): Payer: 59 | Admitting: Family Medicine

## 2012-02-10 VITALS — BP 114/80 | HR 72 | Temp 98.0°F | Wt 189.0 lb

## 2012-02-10 DIAGNOSIS — I10 Essential (primary) hypertension: Secondary | ICD-10-CM

## 2012-02-10 DIAGNOSIS — F3289 Other specified depressive episodes: Secondary | ICD-10-CM

## 2012-02-10 DIAGNOSIS — F329 Major depressive disorder, single episode, unspecified: Secondary | ICD-10-CM

## 2012-02-10 DIAGNOSIS — E119 Type 2 diabetes mellitus without complications: Secondary | ICD-10-CM

## 2012-02-10 LAB — COMPREHENSIVE METABOLIC PANEL
ALT: 15 U/L (ref 0–35)
AST: 18 U/L (ref 0–37)
Alkaline Phosphatase: 76 U/L (ref 39–117)
Glucose, Bld: 94 mg/dL (ref 70–99)
Sodium: 142 mEq/L (ref 135–145)
Total Bilirubin: 0.5 mg/dL (ref 0.3–1.2)
Total Protein: 6.9 g/dL (ref 6.0–8.3)

## 2012-02-10 MED ORDER — BENAZEPRIL-HYDROCHLOROTHIAZIDE 20-12.5 MG PO TABS: 1.0000 | ORAL_TABLET | Freq: Every day | ORAL | Status: DC

## 2012-02-10 MED ORDER — ZOLPIDEM TARTRATE 10 MG PO TABS: 10.0000 mg | ORAL_TABLET | Freq: Every evening | ORAL | Status: DC | PRN

## 2012-02-10 NOTE — Progress Notes (Signed)
61 yo here to discuss medication.  DM- diagnosed in 2008, on Janumet 50/1000- 1 tab by mouth two times a day and Glipizide 2.5 mg daily for years.  Had TKA in February and lost 20 pounds, since then, multiple episodes of hypoglycemia which have resolved since she stopped taking Glipizide.  Now CBGs running in 100s-120s. Lab Results  Component Value Date   HGBA1C 6.5 04/15/2011      Depression- On Wellbutrin 300 mg daily.  Feels well controlled but she is having difficulty falling asleep since her knee surgery.  Feels part of it was pain but that has improved.   HTN- several episodes of hypotension when standing and at PT. On HCTZ 25 mg daily and Amlodipine-Benazapril. Issues with LE edema- has not been using her as needed lasix due to hypotension.  Patient Active Problem List  Diagnoses  . DIABETES MELLITUS, TYPE II  . HYPERLIPIDEMIA  . DEPRESSION  . HYPERTENSION  . GERD  . Abdominal  pain, other specified site  . OA (osteoarthritis)  . Pre-operative clearance  . Murmur, cardiac  . Postop Hyponatremia   Past Medical History  Diagnosis Date  . Depression   . Diabetes mellitus   . GERD (gastroesophageal reflux disease)   . Hyperlipidemia   . Hypertension   . Heart murmur   . Endometrial ca     1998  . Heel spur   . PONV (postoperative nausea and vomiting)     after hysterectomy   Past Surgical History  Procedure Date  . Abdominal hysterectomy 1998  . Left knee arthroscopy 12/2010  . Total knee arthroplasty 12/20/2011    Procedure: TOTAL KNEE ARTHROPLASTY;  Surgeon: Gearlean Alf, MD;  Location: [facility] ORS;  Service: Orthopedics;  Laterality: Left;  Failed attempt at spinal    History  Substance Use Topics  . Smoking status: Never Smoker   . Smokeless tobacco: Not on file  . Alcohol Use: No   Family History  Problem Relation Age of Onset  . Cancer Mother     breast cancer  . Cancer Father     plasma sarcoma   Allergies  Allergen Reactions  . Beta Adrenergic  Blockers     REACTION: Decreased heart rate   Current Outpatient Prescriptions on File Prior to Visit  Medication Sig Dispense Refill  . amLODipine-benazepril (LOTREL) 10-20 MG per capsule TAKE 1 CAPSULE BY MOUTH ONCE DAILY  90 capsule  PRN  . buPROPion (WELLBUTRIN XL) 300 MG 24 hr tablet TAKE 1 TABLET BY MOUTH ONCE DAILY  90 tablet  2  . clonazePAM (KLONOPIN) 0.5 MG tablet TAKE 2 TABLETS BY MOUTH 3 TIMES A DAY AS NEEDED FOR ANXIETY  90 tablet  0  . ezetimibe-simvastatin (VYTORIN) 10-40 MG per tablet Take 1 tablet by mouth at bedtime.       . furosemide (LASIX) 20 MG tablet Take 20 mg by mouth 2 (two) times daily as needed. Fluid       . glipiZIDE (GLUCOTROL) 5 MG tablet Take 0.5 tablets (2.5 mg total) by mouth 2 (two) times daily before a meal.  180 tablet  3  . hydrochlorothiazide (HYDRODIURIL) 25 MG tablet TAKE ONE TABLET BY MOUTH DAILY  90 tablet  2  . lansoprazole (PREVACID) 15 MG capsule Take 15 mg by mouth daily.       . potassium chloride (K-DUR) 10 MEQ tablet Take 10 mEq by mouth 2 (two) times daily as needed. Fluid       . rivaroxaban (  XARELTO) 10 MG TABS tablet Take 1 tablet (10 mg total) by mouth daily with breakfast.  18 tablet  0  . sitaGLIPtan-metformin (JANUMET) 50-1000 MG per tablet Take 1 tablet by mouth 2 (two) times daily with a meal.  180 tablet  3  . traMADol (ULTRAM) 50 MG tablet Take 50 mg by mouth every 8 (eight) hours as needed. Pain       . traMADol (ULTRAM) 50 MG tablet TAKE 2 TABLETS BY MOUTH EVERY 8 HOURS AS NEEDED FOR PAIN **MAX 8 TABLETS PER DAY**  120 tablet  0   The PMH, PSH, Social History, Family History, Medications, and allergies have been reviewed in [facility], and have been updated if relevant.  Review of Systems  See HPI   Physical Exam  BP 114/80  Pulse 72  Temp(Src) 98 F (36.7 C) (Oral)  Wt 189 lb (85.73 kg) Wt Readings from Last 3 Encounters:  02/10/12 189 lb (85.73 kg)  12/20/11 207 lb (93.895 kg)  12/20/11 207 lb (93.895 kg)    General:  alert and overweight-appearing.  Head: normocephalic and no abnormalities observed.  Psych: Cognition and judgment appear intact. Alert and cooperative with normal attention span and concentration. No apparent delusions, illusions, hallucinations   Assessment and Plan:  1. HYPERTENSION  >25 min spent with face to face with patient, >50% counseling and/or coordinating care D/c amlodipine and HCTZ- Rx for benapril 20 mg- HCTZ 12.5 mg daily sent to pharmacy. .The patient indicates understanding of these issues and agrees with the plan.   Comprehensive metabolic panel  2. DIABETES MELLITUS, TYPE II  Improved. D/c glipizide and check a1c today. Continue current dose of janumet. Hemoglobin A1c  3. DEPRESSION  Stable but with insomnia, prescribed ambien for short course.

## 2012-02-10 NOTE — Patient Instructions (Addendum)
Great to see you. Stop taking Glipizide. Stop taking HCTZ and your Benazapril/norvasc combo.  Start taking Benazapril/HCTZ combo. We will call you with your lab results.

## 2012-02-11 LAB — HEMOGLOBIN A1C: Hgb A1c MFr Bld: 6.4 % (ref 4.6–6.5)

## 2012-02-17 ENCOUNTER — Other Ambulatory Visit: Payer: Self-pay | Admitting: Family Medicine

## 2012-02-17 NOTE — Telephone Encounter (Signed)
Medicine called to pharmacy.

## 2012-03-13 ENCOUNTER — Other Ambulatory Visit: Payer: Self-pay | Admitting: *Deleted

## 2012-03-13 MED ORDER — CLONAZEPAM 0.5 MG PO TABS: ORAL_TABLET | ORAL | Status: DC

## 2012-03-13 NOTE — Telephone Encounter (Signed)
Faxed refill request   

## 2012-03-13 NOTE — Telephone Encounter (Signed)
Medicine called to pharmacy.

## 2012-03-23 ENCOUNTER — Other Ambulatory Visit: Payer: Self-pay | Admitting: Family Medicine

## 2012-04-20 ENCOUNTER — Other Ambulatory Visit: Payer: Self-pay | Admitting: Family Medicine

## 2012-04-20 DIAGNOSIS — Z1231 Encounter for screening mammogram for malignant neoplasm of breast: Secondary | ICD-10-CM

## 2012-05-02 ENCOUNTER — Ambulatory Visit
Admission: RE | Admit: 2012-05-02 | Discharge: 2012-05-02 | Disposition: A | Discharge status: Home / Self Care | Payer: 59 | Source: Ambulatory Visit | Attending: Family Medicine | Admitting: Family Medicine

## 2012-05-02 DIAGNOSIS — Z1231 Encounter for screening mammogram for malignant neoplasm of breast: Secondary | ICD-10-CM

## 2012-05-03 ENCOUNTER — Encounter: Payer: Self-pay | Admitting: Family Medicine

## 2012-05-03 ENCOUNTER — Encounter: Payer: Self-pay | Admitting: *Deleted

## 2012-05-05 ENCOUNTER — Other Ambulatory Visit: Payer: Self-pay | Admitting: Family Medicine

## 2012-05-05 NOTE — Telephone Encounter (Signed)
Elect refilled in PCP's absence

## 2012-06-07 ENCOUNTER — Other Ambulatory Visit: Payer: Self-pay | Admitting: Family Medicine

## 2012-06-07 NOTE — Telephone Encounter (Signed)
Medicine called to pharmacy.

## 2012-06-07 NOTE — Telephone Encounter (Signed)
Last filled on 05/05/12.Ok to refill?

## 2012-06-27 ENCOUNTER — Other Ambulatory Visit: Payer: Self-pay | Admitting: Family Medicine

## 2012-06-27 NOTE — Telephone Encounter (Signed)
[facility].

## 2012-08-14 ENCOUNTER — Other Ambulatory Visit: Payer: Self-pay | Admitting: Family Medicine

## 2012-09-06 ENCOUNTER — Other Ambulatory Visit: Payer: Self-pay | Admitting: Family Medicine

## 2012-09-06 NOTE — Telephone Encounter (Signed)
[facility].

## 2012-10-09 ENCOUNTER — Other Ambulatory Visit: Payer: Self-pay | Admitting: Family Medicine

## 2012-10-19 ENCOUNTER — Ambulatory Visit: Payer: 59 | Admitting: Cardiovascular Disease

## 2012-10-24 ENCOUNTER — Inpatient Hospital Stay (HOSPITAL_COMMUNITY)
Admission: EM | Admit: 2012-10-24 | Discharge: 2012-10-27 | DRG: 481 | Disposition: A | Discharge status: Home Health | Payer: 59 | Attending: Emergency Medicine | Admitting: Emergency Medicine

## 2012-10-24 ENCOUNTER — Emergency Department (HOSPITAL_COMMUNITY): Payer: 59

## 2012-10-24 ENCOUNTER — Encounter (HOSPITAL_COMMUNITY): Payer: Self-pay | Admitting: *Deleted

## 2012-10-24 DIAGNOSIS — E782 Mixed hyperlipidemia: Secondary | ICD-10-CM | POA: Diagnosis present

## 2012-10-24 DIAGNOSIS — Z79899 Other long term (current) drug therapy: Secondary | ICD-10-CM

## 2012-10-24 DIAGNOSIS — Z01818 Encounter for other preprocedural examination: Secondary | ICD-10-CM

## 2012-10-24 DIAGNOSIS — E785 Hyperlipidemia, unspecified: Secondary | ICD-10-CM

## 2012-10-24 DIAGNOSIS — S72002A Fracture of unspecified part of neck of left femur, initial encounter for closed fracture: Secondary | ICD-10-CM

## 2012-10-24 DIAGNOSIS — D62 Acute posthemorrhagic anemia: Secondary | ICD-10-CM | POA: Diagnosis not present

## 2012-10-24 DIAGNOSIS — F329 Major depressive disorder, single episode, unspecified: Secondary | ICD-10-CM | POA: Diagnosis present

## 2012-10-24 DIAGNOSIS — S72142A Displaced intertrochanteric fracture of left femur, initial encounter for closed fracture: Secondary | ICD-10-CM

## 2012-10-24 DIAGNOSIS — Z96659 Presence of unspecified artificial knee joint: Secondary | ICD-10-CM

## 2012-10-24 DIAGNOSIS — M1990 Unspecified osteoarthritis, unspecified site: Secondary | ICD-10-CM

## 2012-10-24 DIAGNOSIS — K219 Gastro-esophageal reflux disease without esophagitis: Secondary | ICD-10-CM

## 2012-10-24 DIAGNOSIS — D72829 Elevated white blood cell count, unspecified: Secondary | ICD-10-CM | POA: Diagnosis present

## 2012-10-24 DIAGNOSIS — W010XXA Fall on same level from slipping, tripping and stumbling without subsequent striking against object, initial encounter: Secondary | ICD-10-CM | POA: Diagnosis present

## 2012-10-24 DIAGNOSIS — I10 Essential (primary) hypertension: Secondary | ICD-10-CM

## 2012-10-24 DIAGNOSIS — F411 Generalized anxiety disorder: Secondary | ICD-10-CM | POA: Diagnosis present

## 2012-10-24 DIAGNOSIS — R109 Unspecified abdominal pain: Secondary | ICD-10-CM

## 2012-10-24 DIAGNOSIS — F3289 Other specified depressive episodes: Secondary | ICD-10-CM

## 2012-10-24 DIAGNOSIS — E1169 Type 2 diabetes mellitus with other specified complication: Secondary | ICD-10-CM | POA: Diagnosis present

## 2012-10-24 DIAGNOSIS — E871 Hypo-osmolality and hyponatremia: Secondary | ICD-10-CM

## 2012-10-24 DIAGNOSIS — S72143A Displaced intertrochanteric fracture of unspecified femur, initial encounter for closed fracture: Principal | ICD-10-CM | POA: Diagnosis present

## 2012-10-24 DIAGNOSIS — R011 Cardiac murmur, unspecified: Secondary | ICD-10-CM

## 2012-10-24 DIAGNOSIS — E119 Type 2 diabetes mellitus without complications: Secondary | ICD-10-CM

## 2012-10-24 LAB — URINALYSIS, ROUTINE W REFLEX MICROSCOPIC
Bilirubin Urine: NEGATIVE
Ketones, ur: NEGATIVE mg/dL
Leukocytes, UA: NEGATIVE
Nitrite: NEGATIVE
Specific Gravity, Urine: 1.027 (ref 1.005–1.030)
Urobilinogen, UA: 0.2 mg/dL (ref 0.0–1.0)
pH: 5 (ref 5.0–8.0)

## 2012-10-24 LAB — CBC WITH DIFFERENTIAL/PLATELET
Basophils Absolute: 0 10*3/uL (ref 0.0–0.1)
Basophils Relative: 0 % (ref 0–1)
Eosinophils Absolute: 0.2 10*3/uL (ref 0.0–0.7)
Hemoglobin: 12.8 g/dL (ref 12.0–15.0)
MCH: 27.7 pg (ref 26.0–34.0)
MCHC: 32.7 g/dL (ref 30.0–36.0)
Neutro Abs: 7.8 10*3/uL — ABNORMAL HIGH (ref 1.7–7.7)
Neutrophils Relative %: 65 % (ref 43–77)
Platelets: 180 10*3/uL (ref 150–400)
RDW: 12.6 % (ref 11.5–15.5)

## 2012-10-24 LAB — BASIC METABOLIC PANEL
Chloride: 105 mEq/L (ref 96–112)
GFR calc Af Amer: 70 mL/min — ABNORMAL LOW (ref >90)
GFR calc non Af Amer: 60 mL/min — ABNORMAL LOW (ref >90)
Potassium: 4 mEq/L (ref 3.5–5.1)
Sodium: 137 mEq/L (ref 135–145)

## 2012-10-24 LAB — GLUCOSE, CAPILLARY: Glucose-Capillary: 161 mg/dL — ABNORMAL HIGH (ref 70–99)

## 2012-10-24 LAB — TYPE AND SCREEN: Antibody Screen: NEGATIVE

## 2012-10-24 MED ORDER — ASPIRIN 81 MG PO TABS: 81.0000 mg | ORAL_TABLET | Freq: Every day | ORAL | Status: DC

## 2012-10-24 MED ORDER — ZOLPIDEM TARTRATE 10 MG PO TABS: 10.0000 mg | ORAL_TABLET | Freq: Every evening | ORAL | Status: DC | PRN

## 2012-10-24 MED ORDER — EZETIMIBE-SIMVASTATIN 10-40 MG PO TABS
1.0000 | ORAL_TABLET | Freq: Every day | ORAL | Status: DC
  Administered 2012-10-25 – 2012-10-26 (×2): 1 via ORAL
  Filled 2012-10-24 (×4): qty 1

## 2012-10-24 MED ORDER — SITAGLIPTIN PHOS-METFORMIN HCL 50-1000 MG PO TABS: 1.0000 | ORAL_TABLET | Freq: Two times a day (BID) | ORAL | Status: DC

## 2012-10-24 MED ORDER — BENAZEPRIL HCL 20 MG PO TABS
20.0000 mg | ORAL_TABLET | Freq: Every day | ORAL | Status: DC
  Administered 2012-10-26 – 2012-10-27 (×2): 20 mg via ORAL
  Filled 2012-10-24 (×3): qty 1

## 2012-10-24 MED ORDER — SODIUM CHLORIDE 0.9 % IV SOLN: INTRAVENOUS | Status: DC

## 2012-10-24 MED ORDER — HEPARIN SODIUM (PORCINE) 5000 UNIT/ML IJ SOLN
5000.0000 [IU] | Freq: Three times a day (TID) | INTRAMUSCULAR | Status: DC
  Filled 2012-10-24 (×5): qty 1

## 2012-10-24 MED ORDER — METFORMIN HCL 500 MG PO TABS
1000.0000 mg | ORAL_TABLET | Freq: Two times a day (BID) | ORAL | Status: DC
  Administered 2012-10-25 – 2012-10-27 (×4): 1000 mg via ORAL
  Filled 2012-10-24 (×7): qty 2

## 2012-10-24 MED ORDER — ASPIRIN 81 MG PO CHEW
81.0000 mg | CHEWABLE_TABLET | Freq: Every day | ORAL | Status: DC
  Administered 2012-10-26 – 2012-10-27 (×2): 81 mg via ORAL
  Filled 2012-10-24 (×3): qty 1

## 2012-10-24 MED ORDER — PANTOPRAZOLE SODIUM 20 MG PO TBEC
20.0000 mg | DELAYED_RELEASE_TABLET | Freq: Every day | ORAL | Status: DC
  Administered 2012-10-26 – 2012-10-27 (×2): 20 mg via ORAL
  Filled 2012-10-24 (×3): qty 1

## 2012-10-24 MED ORDER — CLONAZEPAM 0.5 MG PO TABS
0.5000 mg | ORAL_TABLET | Freq: Three times a day (TID) | ORAL | Status: DC | PRN
  Administered 2012-10-24: 0.5 mg via ORAL
  Filled 2012-10-24: qty 1

## 2012-10-24 MED ORDER — ONDANSETRON HCL 4 MG/2ML IJ SOLN
4.0000 mg | Freq: Once | INTRAMUSCULAR | Status: AC
  Administered 2012-10-24: 4 mg via INTRAVENOUS
  Filled 2012-10-24: qty 2

## 2012-10-24 MED ORDER — LINAGLIPTIN 5 MG PO TABS
5.0000 mg | ORAL_TABLET | Freq: Every day | ORAL | Status: DC
  Administered 2012-10-26 – 2012-10-27 (×2): 5 mg via ORAL
  Filled 2012-10-24 (×4): qty 1

## 2012-10-24 MED ORDER — BENAZEPRIL-HYDROCHLOROTHIAZIDE 20-12.5 MG PO TABS: 1.0000 | ORAL_TABLET | Freq: Every morning | ORAL | Status: DC

## 2012-10-24 MED ORDER — HYDROMORPHONE HCL PF 1 MG/ML IJ SOLN
1.0000 mg | Freq: Once | INTRAMUSCULAR | Status: AC
  Administered 2012-10-24: 1 mg via INTRAVENOUS
  Filled 2012-10-24: qty 1

## 2012-10-24 MED ORDER — BUPROPION HCL ER (XL) 300 MG PO TB24
300.0000 mg | ORAL_TABLET | Freq: Every morning | ORAL | Status: DC
  Administered 2012-10-26 – 2012-10-27 (×2): 300 mg via ORAL
  Filled 2012-10-24 (×3): qty 1

## 2012-10-24 MED ORDER — CHLORHEXIDINE GLUCONATE 4 % EX LIQD
60.0000 mL | Freq: Once | CUTANEOUS | Status: DC
  Filled 2012-10-24: qty 60

## 2012-10-24 MED ORDER — HYDROMORPHONE HCL PF 1 MG/ML IJ SOLN
1.0000 mg | INTRAMUSCULAR | Status: DC | PRN
  Administered 2012-10-24 – 2012-10-26 (×5): 1 mg via INTRAVENOUS
  Filled 2012-10-24 (×5): qty 1

## 2012-10-24 MED ORDER — KCL IN DEXTROSE-NACL 20-5-0.9 MEQ/L-%-% IV SOLN
INTRAVENOUS | Status: DC
  Administered 2012-10-25: 01:00:00 via INTRAVENOUS
  Filled 2012-10-24 (×2): qty 1000

## 2012-10-24 MED ORDER — HYDROCODONE-ACETAMINOPHEN 5-325 MG PO TABS: 1.0000 | ORAL_TABLET | ORAL | Status: DC | PRN

## 2012-10-24 MED ORDER — FENTANYL CITRATE 0.05 MG/ML IJ SOLN: 50.0000 ug | Freq: Once | INTRAMUSCULAR | Status: DC

## 2012-10-24 MED ORDER — HYDROCHLOROTHIAZIDE 12.5 MG PO CAPS
12.5000 mg | ORAL_CAPSULE | Freq: Every day | ORAL | Status: DC
  Administered 2012-10-26 – 2012-10-27 (×2): 12.5 mg via ORAL
  Filled 2012-10-24 (×3): qty 1

## 2012-10-24 MED ORDER — INSULIN ASPART 100 UNIT/ML ~~LOC~~ SOLN
0.0000 [IU] | SUBCUTANEOUS | Status: DC
  Administered 2012-10-24: 3 [IU] via SUBCUTANEOUS
  Administered 2012-10-25: 5 [IU] via SUBCUTANEOUS
  Administered 2012-10-25 (×2): 3 [IU] via SUBCUTANEOUS
  Administered 2012-10-25: 5 [IU] via SUBCUTANEOUS
  Administered 2012-10-26 (×3): 3 [IU] via SUBCUTANEOUS
  Filled 2012-10-24: qty 1

## 2012-10-24 MED ORDER — POTASSIUM CHLORIDE ER 10 MEQ PO TBCR
10.0000 meq | EXTENDED_RELEASE_TABLET | Freq: Every day | ORAL | Status: DC
  Administered 2012-10-26 – 2012-10-27 (×2): 10 meq via ORAL
  Filled 2012-10-24 (×3): qty 1

## 2012-10-24 MED ORDER — VITAMIN D3 25 MCG (1000 UNIT) PO TABS
1000.0000 [IU] | ORAL_TABLET | Freq: Every day | ORAL | Status: DC
  Administered 2012-10-26 – 2012-10-27 (×2): 1000 [IU] via ORAL
  Filled 2012-10-24 (×3): qty 1

## 2012-10-24 MED ORDER — CEFAZOLIN SODIUM-DEXTROSE 2-3 GM-% IV SOLR
2.0000 g | INTRAVENOUS | Status: AC
  Administered 2012-10-25: 2 g via INTRAVENOUS

## 2012-10-24 MED ORDER — OMEGA-3-ACID ETHYL ESTERS 1 G PO CAPS
2.0000 g | ORAL_CAPSULE | Freq: Two times a day (BID) | ORAL | Status: DC
  Administered 2012-10-26 – 2012-10-27 (×3): 2 g via ORAL
  Filled 2012-10-24 (×7): qty 2

## 2012-10-24 NOTE — ED Notes (Signed)
Gave pt diet coke per request. RN and admitting are informed

## 2012-10-24 NOTE — ED Notes (Signed)
RCB:ULAG<TX> Expected date:<BR> Expected time:<BR> Means of arrival:<BR> Comments:<BR> 61yoF-hip pain, deformity, fentanyl on board

## 2012-10-24 NOTE — ED Notes (Signed)
Pt in by ems from home. Lost her balance, tripped down 3 steps. Fell on L hip. Obvious deformity, shortening and outward rotation to L leg. Denies neck or back pain, LOC. Pt arrives on LSB. R FA 18g, 29mg fentanyl en route.

## 2012-10-24 NOTE — ED Provider Notes (Signed)
History     CSN: [id number]  Arrival date & time 10/24/12  4163   First MD Initiated Contact with Patient 10/24/12 1859      Chief Complaint  Patient presents with  . Fall  . Hip Pain    (Consider location/radiation/quality/duration/timing/severity/associated sxs/prior treatment) Patient is a 61 y.o. female presenting with fall and hip pain. The history is provided by the patient.  Fall  Hip Pain  She lost her balance and fell landing on her right hip. Her left leg twisted as she fell. She complaining of severe pain in the left hip. Pain is worse with any movement. She is unable to get up. EMS put her on a long spine board and has given her fentanyl for pain which has helped a little bit. She is status post left knee replacement.  Past Medical History  Diagnosis Date  . Depression   . Diabetes mellitus   . GERD (gastroesophageal reflux disease)   . Hyperlipidemia   . Hypertension   . Heart murmur   . Endometrial ca     1998  . Heel spur   . PONV (postoperative nausea and vomiting)     after hysterectomy    Past Surgical History  Procedure Date  . Abdominal hysterectomy 1998  . Left knee arthroscopy 12/2010  . 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 Cancer Mother     breast cancer  . Cancer Father     plasma sarcoma    History  Substance Use Topics  . Smoking status: Never Smoker   . Smokeless tobacco: Not on file  . Alcohol Use: No    OB History    Grav Para Term Preterm Abortions TAB SAB Ect Mult Living                  Review of Systems  All other systems reviewed and are negative.    Allergies  Beta adrenergic blockers  Home Medications   Current Outpatient Rx  Name  Route  Sig  Dispense  Refill  . ASPIRIN 81 MG PO TABS   Oral  Take 81 mg by mouth daily.         Marland Kitchen BENAZEPRIL-HYDROCHLOROTHIAZIDE 20-12.5 MG PO TABS      TAKE 1 TABLET BY MOUTH DAILY.   30 tablet   5   . BUPROPION HCL ER (XL) 300 MG PO TB24      TAKE 1 TABLET BY MOUTH ONCE DAILY   90 tablet   2   . VITAMIN D 1000 UNITS PO TABS   Oral   Take 1,000 Units by mouth daily.         Marland Kitchen CLONAZEPAM 0.5 MG PO TABS      TAKE 2 TABLETS BY MOUTH 3 TIMES A DAY AS NEEDED FOR ANXIETY   90 tablet   1   . CLONAZEPAM 1 MG PO TABS   Oral   Take 1 mg by mouth 3 (three) times daily as needed.         . FUROSEMIDE 20 MG PO TABS   Oral   Take 20 mg by mouth 2 (two) times daily as needed. Fluid          . JANUMET 50-1000 MG PO TABS  TAKE 1 TABLET BY MOUTH 2 TIMES DAILY WITH A MEAL   180 tablet   3   . LANSOPRAZOLE 15 MG PO CPDR   Oral   Take 15 mg by mouth daily.          Marland Kitchen METHOCARBAMOL 500 MG PO TABS   Oral   Take 500 mg by mouth 3 (three) times daily.         Marland Kitchen LOVAZA PO      Take 2 by mouth twice a day.         . OXYCODONE HCL 5 MG PO CAPS      Take one by mouth every 4-6 hours as needed         . POTASSIUM CHLORIDE ER 10 MEQ PO TBCR   Oral   Take 10 mEq by mouth 2 (two) times daily as needed. Fluid          . SITAGLIPTIN-METFORMIN HCL 50-1000 MG PO TABS   Oral   Take 1 tablet by mouth 2 (two) times daily with a meal.         . TRAMADOL HCL 50 MG PO TABS   Oral   Take 50 mg by mouth every 8 (eight) hours as needed. Pain          . TRAMADOL HCL 50 MG PO TABS      TAKE 2 TABLETS BY MOUTH EVERY 8 HOURS AS NEEDED FOR PAIN **MAX 8 TABLETS PER DAY**   120 tablet   1   . VYTORIN 10-40 MG PO TABS      TAKE 1 TABLET BY MOUTH ONCE DAILY   90 tablet   3   . ZOLPIDEM TARTRATE 10 MG PO TABS      TAKE 1 TABLET BY MOUTH AT BEDTIME AS NEEDED FOR SLEEP   30 tablet   1     BP 140/78  Pulse 92  Temp 98 F (36.7 C) (Oral)  Resp 20  SpO2 93%  Physical Exam  Nursing note and vitals reviewed. 61  year old female, on a long spine board and obviously in pain. Vital signs are normal. Oxygen saturation is 93%, which is normal. Head is normocephalic and atraumatic. PERRLA, EOMI. Oropharynx is clear. Neck is nontender and supple without adenopathy or JVD. Back is nontender and there is no CVA tenderness. Lungs are clear without rales, wheezes, or rhonchi. Chest is nontender. Heart has regular rate and rhythm without murmur. Abdomen is soft, flat, nontender without masses or hepatosplenomegaly and peristalsis is normoactive. Extremities: Left leg is shortened and externally rotated. There is mild tenderness palpation over the left hip and there is marked pain with any movement of the left hip. Distal pulses are strong, capillary refill is prompt, sensation is normal.. Skin is warm and dry without rash. Neurologic: Mental status is normal, cranial nerves are intact, there are no motor or sensory deficits.   ED Course  Procedures (including critical care time)  Results for orders placed during the hospital encounter of 10/24/12  CBC WITH DIFFERENTIAL      Component Value Range   WBC 12.1 (*) 4.0 - 10.5 K/uL   RBC 4.62  3.87 - 5.11 MIL/uL   Hemoglobin 12.8  12.0 - 15.0 g/dL   HCT 39.1  36.0 - 46.0 %   MCV 84.6  78.0 - 100.0 fL   MCH 27.7  26.0 - 34.0 pg   MCHC 32.7  30.0 - 36.0 g/dL   RDW 12.6  11.5 - 15.5 %   Platelets 180  150 - 400 K/uL   Neutrophils Relative 65  43 - 77 %   Neutro Abs 7.8 (*) 1.7 - 7.7 K/uL   Lymphocytes Relative 30  12 - 46 %   Lymphs Abs 3.6  0.7 - 4.0 K/uL   Monocytes Relative 4  3 - 12 %   Monocytes Absolute 0.4  0.1 - 1.0 K/uL   Eosinophils Relative 2  0 - 5 %   Eosinophils Absolute 0.2  0.0 - 0.7 K/uL   Basophils Relative 0  0 - 1 %   Basophils Absolute 0.0  0.0 - 0.1 K/uL  BASIC METABOLIC PANEL      Component Value Range   Sodium 137  135 - 145 mEq/L   Potassium 4.0  3.5 - 5.1 mEq/L   Chloride 105  96 - 112 mEq/L   CO2 24  19 - 32 mEq/L   Glucose,  Bld 168 (*) 70 - 99 mg/dL   BUN 21  6 - 23 mg/dL   Creatinine, Ser 0.98  0.50 - 1.10 mg/dL   Calcium 9.3  8.4 - 10.5 mg/dL   GFR calc non Af Amer 60 (*) >90 mL/min   GFR calc Af Amer 70 (*) >90 mL/min  URINALYSIS, ROUTINE W REFLEX MICROSCOPIC      Component Value Range   Color, Urine YELLOW  YELLOW   APPearance CLEAR  CLEAR   Specific Gravity, Urine 1.027  1.005 - 1.030   pH 5.0  5.0 - 8.0   Glucose, UA NEGATIVE  NEGATIVE mg/dL   Hgb urine dipstick NEGATIVE  NEGATIVE   Bilirubin Urine NEGATIVE  NEGATIVE   Ketones, ur NEGATIVE  NEGATIVE mg/dL   Protein, ur NEGATIVE  NEGATIVE mg/dL   Urobilinogen, UA 0.2  0.0 - 1.0 mg/dL   Nitrite NEGATIVE  NEGATIVE   Leukocytes, UA NEGATIVE  NEGATIVE  TYPE AND SCREEN      Component Value Range   ABO/RH(D) O POS     Antibody Screen PENDING     Sample Expiration 10/27/2012     Dg Chest 1 View  10/24/2012  *RADIOLOGY REPORT*  Clinical Data: Left hip pain post fall, history hypertension, diabetes, endometrial cancer  CHEST - 1 VIEW  Comparison: 12/15/2011  Findings: Enlargement of cardiac silhouette. Calcified tortuous aorta. Pulmonary vascularity normal. Lungs clear. No pleural effusion or pneumothorax. Bones unremarkable.  IMPRESSION: Enlargement of cardiac silhouette. No acute abnormalities.   Original Report Authenticated By: Lavonia Dana, M.D.    Dg Hip Complete Left  10/24/2012  *RADIOLOGY REPORT*  Clinical Data: Left hip pain post fall  LEFT HIP - COMPLETE 2+ VIEW  Comparison: None  Findings: Osseous demineralization. Symmetric preserved hip and SI joints. Comminuted displaced intertrochanteric fracture left femur with mild varus angulation. No dislocation seen. Pelvis intact. Small pelvic phleboliths. Scattered atherosclerotic calcification.  IMPRESSION: Comminuted displaced and mildly angulated intertrochanteric fracture left femur. Osseous demineralization.   Original Report Authenticated By: Lavonia Dana, M.D.     Images viewed by me.   Date:  10/24/2012  Rate: 90  Rhythm: normal sinus rhythm  QRS Axis: right  Intervals: normal  ST/T Wave abnormalities: normal  Conduction Disutrbances:none  Narrative Interpretation:  Right axis deviation. No prior ECG available for comparison.  Old EKG Reviewed: none available    1. Intertrochanteric fracture of left hip       MDM  Fall with left hip injury worrisome for a hip fracture. Patient is concerned  that she dislocated hips and she did land on that side. X-rays are pending.  X-rays confirm intertrochanteric hip fracture. This is been discussed with Dr. Doran Durand of orthopedics who states that Dr. Alvan Dame will operate on her tomorrow. Case is discussed with Dr. Marin Comment of [facility] who agrees to admit the patient to      Delora Fuel, MD 23/46/88 [id number]

## 2012-10-24 NOTE — H&P (Signed)
[facility] History and Physical  Brenda [name] SEG:[id number] DOB: Jul 19, 1951    PCP:   Arnette Norris, MD   Chief Complaint: Fx left hip.  HPI: Brenda [name] is an 61 y.o. female with hx of DM2, HTN, Hyperlipidemia, s/p knee Sx without problems, depression and anxiety, tripped and fell at her church without loss of consciousness, presents to ER with a left Fx hip.  Evaluation in the ER included Xray with Left comminuted and displaced intertrochanteric Fx.  Her CXR showed mild cardiac megaly, WBC 12K, Hb 12.8 G/DL, normal renal fx tests, and EKG showed NSR with no acute ST -T changes.  EDP informed me that Dr Doran Durand was called and planned for surgery in the morning.  She denied CP, SOB, abd pain, fever or chills.  Hospitalist was asked to admit her for left hip Fx.  Rewiew of Systems:  Constitutional: Negative for malaise, fever and chills. No significant weight loss or weight gain Eyes: Negative for eye pain, redness and discharge, diplopia, visual changes, or flashes of light. ENMT: Negative for ear pain, hoarseness, nasal congestion, sinus pressure and sore throat. No headaches; tinnitus, drooling, or problem swallowing. Cardiovascular: Negative for chest pain, palpitations, diaphoresis, dyspnea and peripheral edema. ; No orthopnea, PND Respiratory: Negative for cough, hemoptysis, wheezing and stridor. No pleuritic chestpain. Gastrointestinal: Negative for nausea, vomiting, diarrhea, constipation, abdominal pain, melena, blood in stool, hematemesis, jaundice and rectal bleeding.    Genitourinary: Negative for frequency, dysuria, incontinence,flank pain and hematuria; Musculoskeletal: Negative for back pain and neck pain. Negative for swelling and trauma.;  Skin: . Negative for pruritus, rash, abrasions, bruising and skin lesion.; ulcerations Neuro: Negative for headache, lightheadedness and neck stiffness. Negative for weakness, altered level of consciousness , altered mental  status, extremity weakness, burning feet, involuntary movement, seizure and syncope.  Psych: negative for anxiety, depression, insomnia, tearfulness, panic attacks, hallucinations, paranoia, suicidal or homicidal ideation   Past Medical History  Diagnosis Date  . Depression   . Diabetes mellitus   . GERD (gastroesophageal reflux disease)   . Hyperlipidemia   . Hypertension   . Heart murmur   . Endometrial ca     1998  . Heel spur   . PONV (postoperative nausea and vomiting)     after hysterectomy    Past Surgical History  Procedure Date  . Abdominal hysterectomy 1998  . Left knee arthroscopy 12/2010  . Total knee arthroplasty 12/20/2011    Procedure: TOTAL KNEE ARTHROPLASTY;  Surgeon: Gearlean Alf, MD;  Location: [facility];  Service: Orthopedics;  Laterality: Left;  Failed attempt at spinal     Medications:  HOME MEDS: Prior to Admission medications   Medication Sig Start Date End Date Taking? Authorizing Provider  aspirin 81 MG tablet Take 81 mg by mouth daily.   Yes Historical Provider, MD  benazepril-hydrochlorthiazide (LOTENSIN HCT) 20-12.5 MG per tablet Take 1 tablet by mouth every morning.   Yes Historical Provider, MD  buPROPion (WELLBUTRIN XL) 300 MG 24 hr tablet Take 300 mg by mouth every morning.   Yes Historical Provider, MD  cholecalciferol (VITAMIN D) 1000 UNITS tablet Take 1,000 Units by mouth daily.   Yes Historical Provider, MD  clonazePAM (KLONOPIN) 0.5 MG tablet Take 0.5 mg by mouth 3 (three) times daily as needed. anxiety   Yes Historical Provider, MD  ezetimibe-simvastatin (VYTORIN) 10-40 MG per tablet Take 1 tablet by mouth at bedtime.   Yes Historical Provider, MD  furosemide (LASIX) 20 MG tablet Take 20  mg by mouth 2 (two) times daily as needed. Fluid 10/15/11 10/14/13 Yes Minna Merritts, MD  lansoprazole (PREVACID) 15 MG capsule Take 15 mg by mouth daily.    Yes Historical Provider, MD  omega-3 acid ethyl esters (LOVAZA) 1 G capsule Take 2 g by mouth 2  (two) times daily.   Yes Historical Provider, MD  potassium chloride (K-DUR) 10 MEQ tablet Take 10 mEq by mouth 2 (two) times daily as needed. Fluid 10/15/11 11/23/12 Yes Minna Merritts, MD  sitaGLIPtan-metformin (JANUMET) 50-1000 MG per tablet Take 1 tablet by mouth 2 (two) times daily with a meal. 01/28/11 01/28/14 Yes Lucille Passy, MD  traMADol (ULTRAM) 50 MG tablet Take 50 mg by mouth every 8 (eight) hours as needed. Pain    Yes Historical Provider, MD  zolpidem (AMBIEN) 10 MG tablet Take 10 mg by mouth at bedtime as needed. sleep   Yes Historical Provider, MD     Allergies:  Allergies  Allergen Reactions  . Beta Adrenergic Blockers     REACTION: Decreased heart rate    Social History:   reports that she has never smoked. She has never used smokeless tobacco. She reports that she does not drink alcohol or use illicit drugs.  Family History: Family History  Problem Relation Age of Onset  . Cancer Mother     breast cancer  . Cancer Father     plasma sarcoma     Physical Exam: Filed Vitals:   10/24/12 1846 10/24/12 2129  BP: 140/78 136/62  Pulse: 92 103  Temp: 98 F (36.7 C) 97.8 F (36.6 C)  TempSrc: Oral Oral  Resp: 20 16  SpO2: 93% 95%   Blood pressure 136/62, pulse 103, temperature 97.8 F (36.6 C), temperature source Oral, resp. rate 16, SpO2 95.00%.  GEN:  Pleasant patient lying in the stretcher in no acute distress; cooperative with exam. PSYCH:  alert and oriented x4;  appear anxious but not depressed; affect is appropriate. HEENT: Mucous membranes pink and anicteric; PERRLA; EOM intact; no cervical lymphadenopathy nor thyromegaly or carotid bruit; no JVD; There were no stridor. Neck is very supple. Breasts:: Not examined CHEST WALL: No tenderness CHEST: Normal respiration, clear to auscultation bilaterally.  HEART: Regular rate and rhythm.  There are no murmur, rub, or gallops.   BACK: No kyphosis or scoliosis; no CVA tenderness ABDOMEN: soft and  non-tender; no masses, no organomegaly, normal abdominal bowel sounds; no pannus; no intertriginous candida. There is no rebound and no distention. Rectal Exam: Not done EXTREMITIES: No bone or joint deformity; age-appropriate arthropathy of the hands and knees; no edema; no ulcerations.  There is no calf tenderness. Her left leg is shorter and externally rotated. Genitalia: not examined PULSES: 2+ and symmetric SKIN: Normal hydration no rash or ulceration CNS: Cranial nerves 2-12 grossly intact no focal lateralizing neurologic deficit.  Speech is fluent; uvula elevated with phonation, facial symmetry and tongue midline. DTR are normal bilaterally, cerebella exam is intact, barbinski is negative and strengths are equaled bilaterally.  No sensory loss.   Labs on Admission:  Basic Metabolic Panel:  Lab 00/86/76 1950  NA 137  K 4.0  CL 105  CO2 24  GLUCOSE 168*  BUN 21  CREATININE 0.98  CALCIUM 9.3  MG --  PHOS --   Liver Function Tests: No results found for this basename: AST:5,ALT:5,ALKPHOS:5,BILITOT:5,PROT:5,ALBUMIN:5 in the last 168 hours No results found for this basename: LIPASE:5,AMYLASE:5 in the last 168 hours No results found for this  basename: AMMONIA:5 in the last 168 hours CBC:  Lab 10/24/12 1950  WBC 12.1*  NEUTROABS 7.8*  HGB 12.8  HCT 39.1  MCV 84.6  PLT 180   Cardiac Enzymes: No results found for this basename: CKTOTAL:5,CKMB:5,CKMBINDEX:5,TROPONINI:5 in the last 168 hours  CBG:  Lab 10/24/12 2115  GLUCAP 161*     Radiological Exams on Admission: Dg Chest 1 View  10/24/2012  *RADIOLOGY REPORT*  Clinical Data: Left hip pain post fall, history hypertension, diabetes, endometrial cancer  CHEST - 1 VIEW  Comparison: 12/15/2011  Findings: Enlargement of cardiac silhouette. Calcified tortuous aorta. Pulmonary vascularity normal. Lungs clear. No pleural effusion or pneumothorax. Bones unremarkable.  IMPRESSION: Enlargement of cardiac silhouette. No acute  abnormalities.   Original Report Authenticated By: Lavonia Dana, M.D.    Dg Hip Complete Left  10/24/2012  *RADIOLOGY REPORT*  Clinical Data: Left hip pain post fall  LEFT HIP - COMPLETE 2+ VIEW  Comparison: None  Findings: Osseous demineralization. Symmetric preserved hip and SI joints. Comminuted displaced intertrochanteric fracture left femur with mild varus angulation. No dislocation seen. Pelvis intact. Small pelvic phleboliths. Scattered atherosclerotic calcification.  IMPRESSION: Comminuted displaced and mildly angulated intertrochanteric fracture left femur. Osseous demineralization.   Original Report Authenticated By: Lavonia Dana, M.D.     EKG: Independently reviewed. SR with no acute ST-T changes.   Assessment/Plan Present on Admission:  . Hip fracture, left . DIABETES MELLITUS, TYPE II . HYPERTENSION . DEPRESSION . OA (osteoarthritis) . HYPERLIPIDEMIA   PLAN:  Will admit her for left hip Fx.  She will receive IVF, pain meds, and DVT prophylaxis with heparin SQ.  Accepting an increased cardiovascular risk perioperatively, she is cleared for surgery.  Will make her NPO.  I will continue her oral DM meds, and add SSI.   Will hold her Lasix, but continue her Lorsartan/Hct.  She is very stable, full code, and will be admitted to [facility] service.  Thank you for allowing [place] to partake in the care of your patient.  Other plans as per orders.  Code Status: FULL Haskel Khan, MD. [facility] Pager [phone] 7pm to 7am.  10/24/2012, 9:43 PM

## 2012-10-25 ENCOUNTER — Inpatient Hospital Stay (HOSPITAL_COMMUNITY): Payer: 59

## 2012-10-25 ENCOUNTER — Encounter (HOSPITAL_COMMUNITY)
Admission: EM | Disposition: A | Discharge status: Home Health | Payer: Self-pay | Source: Home / Self Care | Attending: Internal Medicine

## 2012-10-25 ENCOUNTER — Inpatient Hospital Stay (HOSPITAL_COMMUNITY): Payer: 59 | Admitting: Anesthesiology

## 2012-10-25 ENCOUNTER — Encounter (HOSPITAL_COMMUNITY): Payer: Self-pay | Admitting: Anesthesiology

## 2012-10-25 DIAGNOSIS — F329 Major depressive disorder, single episode, unspecified: Secondary | ICD-10-CM

## 2012-10-25 HISTORY — PX: FEMUR IM NAIL: SHX1597

## 2012-10-25 LAB — GLUCOSE, CAPILLARY
Glucose-Capillary: 182 mg/dL — ABNORMAL HIGH (ref 70–99)
Glucose-Capillary: 166 mg/dL — ABNORMAL HIGH (ref 70–99)
Glucose-Capillary: 111 mg/dL — ABNORMAL HIGH (ref 70–99)
Glucose-Capillary: 148 mg/dL — ABNORMAL HIGH (ref 70–99)
Glucose-Capillary: 208 mg/dL — ABNORMAL HIGH (ref 70–99)

## 2012-10-25 LAB — HEMOGLOBIN A1C: Hgb A1c MFr Bld: 7.2 % — ABNORMAL HIGH (ref <5.7)

## 2012-10-25 SURGERY — INSERTION, INTRAMEDULLARY ROD, FEMUR
Anesthesia: General | Site: Hip | Laterality: Left | Wound class: Clean

## 2012-10-25 MED ORDER — POLYETHYLENE GLYCOL 3350 17 G PO PACK: 17.0000 g | PACK | Freq: Every day | ORAL | Status: DC | PRN

## 2012-10-25 MED ORDER — ENOXAPARIN SODIUM 40 MG/0.4ML ~~LOC~~ SOLN
40.0000 mg | SUBCUTANEOUS | Status: DC
  Administered 2012-10-26 – 2012-10-27 (×2): 40 mg via SUBCUTANEOUS
  Filled 2012-10-25 (×3): qty 0.4

## 2012-10-25 MED ORDER — ONDANSETRON HCL 4 MG/2ML IJ SOLN: 4.0000 mg | Freq: Four times a day (QID) | INTRAMUSCULAR | Status: DC | PRN

## 2012-10-25 MED ORDER — DEXAMETHASONE SODIUM PHOSPHATE 10 MG/ML IJ SOLN
INTRAMUSCULAR | Status: DC | PRN
  Administered 2012-10-25: 10 mg via INTRAVENOUS

## 2012-10-25 MED ORDER — PROMETHAZINE HCL 25 MG/ML IJ SOLN: 6.2500 mg | INTRAMUSCULAR | Status: DC | PRN

## 2012-10-25 MED ORDER — PHENOL 1.4 % MT LIQD
1.0000 | OROMUCOSAL | Status: DC | PRN
  Filled 2012-10-25: qty 177

## 2012-10-25 MED ORDER — MIDAZOLAM HCL 5 MG/5ML IJ SOLN
INTRAMUSCULAR | Status: DC | PRN
  Administered 2012-10-25: 2 mg via INTRAVENOUS

## 2012-10-25 MED ORDER — HYDROMORPHONE HCL PF 1 MG/ML IJ SOLN
0.2500 mg | INTRAMUSCULAR | Status: DC | PRN
  Administered 2012-10-25 (×4): 0.5 mg via INTRAVENOUS

## 2012-10-25 MED ORDER — METOCLOPRAMIDE HCL 5 MG/ML IJ SOLN: 5.0000 mg | Freq: Three times a day (TID) | INTRAMUSCULAR | Status: DC | PRN

## 2012-10-25 MED ORDER — CEFAZOLIN SODIUM-DEXTROSE 2-3 GM-% IV SOLR
2.0000 g | Freq: Four times a day (QID) | INTRAVENOUS | Status: AC
  Administered 2012-10-25 – 2012-10-26 (×2): 2 g via INTRAVENOUS
  Filled 2012-10-25 (×2): qty 50

## 2012-10-25 MED ORDER — ONDANSETRON HCL 4 MG/2ML IJ SOLN
INTRAMUSCULAR | Status: DC | PRN
  Administered 2012-10-25: 4 mg via INTRAVENOUS

## 2012-10-25 MED ORDER — ACETAMINOPHEN 650 MG RE SUPP: 650.0000 mg | Freq: Four times a day (QID) | RECTAL | Status: DC | PRN

## 2012-10-25 MED ORDER — MENTHOL 3 MG MT LOZG
1.0000 | LOZENGE | OROMUCOSAL | Status: DC | PRN
  Filled 2012-10-25: qty 9

## 2012-10-25 MED ORDER — METHOCARBAMOL 500 MG PO TABS
500.0000 mg | ORAL_TABLET | Freq: Four times a day (QID) | ORAL | Status: DC | PRN
  Administered 2012-10-26 – 2012-10-27 (×3): 500 mg via ORAL
  Filled 2012-10-25 (×3): qty 1

## 2012-10-25 MED ORDER — METOCLOPRAMIDE HCL 10 MG PO TABS: 5.0000 mg | ORAL_TABLET | Freq: Three times a day (TID) | ORAL | Status: DC | PRN

## 2012-10-25 MED ORDER — MORPHINE SULFATE 2 MG/ML IJ SOLN: 0.5000 mg | INTRAMUSCULAR | Status: DC | PRN

## 2012-10-25 MED ORDER — ACETAMINOPHEN 10 MG/ML IV SOLN: 1000.0000 mg | Freq: Once | INTRAVENOUS | Status: DC | PRN

## 2012-10-25 MED ORDER — 0.9 % SODIUM CHLORIDE (POUR BTL) OPTIME
TOPICAL | Status: DC | PRN
  Administered 2012-10-25: 1000 mL

## 2012-10-25 MED ORDER — SUCCINYLCHOLINE CHLORIDE 20 MG/ML IJ SOLN
INTRAMUSCULAR | Status: DC | PRN
  Administered 2012-10-25: 100 mg via INTRAVENOUS

## 2012-10-25 MED ORDER — SODIUM CHLORIDE 0.9 % IV SOLN
INTRAVENOUS | Status: DC
  Administered 2012-10-25 – 2012-10-26 (×2): via INTRAVENOUS
  Filled 2012-10-25 (×4): qty 1000

## 2012-10-25 MED ORDER — METHOCARBAMOL 100 MG/ML IJ SOLN
500.0000 mg | Freq: Four times a day (QID) | INTRAMUSCULAR | Status: DC | PRN
  Administered 2012-10-26: 500 mg via INTRAVENOUS
  Filled 2012-10-25 (×2): qty 5

## 2012-10-25 MED ORDER — OXYCODONE HCL 5 MG PO TABS: 5.0000 mg | ORAL_TABLET | Freq: Once | ORAL | Status: DC | PRN

## 2012-10-25 MED ORDER — LACTATED RINGERS IV SOLN
INTRAVENOUS | Status: DC | PRN
  Administered 2012-10-25 (×2): via INTRAVENOUS

## 2012-10-25 MED ORDER — PROPOFOL 10 MG/ML IV BOLUS
INTRAVENOUS | Status: DC | PRN
  Administered 2012-10-25: 120 mg via INTRAVENOUS

## 2012-10-25 MED ORDER — DOCUSATE SODIUM 100 MG PO CAPS
100.0000 mg | ORAL_CAPSULE | Freq: Two times a day (BID) | ORAL | Status: DC
  Administered 2012-10-25 – 2012-10-27 (×4): 100 mg via ORAL

## 2012-10-25 MED ORDER — HYDROCODONE-ACETAMINOPHEN 5-325 MG PO TABS
1.0000 | ORAL_TABLET | Freq: Four times a day (QID) | ORAL | Status: DC | PRN
  Administered 2012-10-25: 1 via ORAL
  Administered 2012-10-26 (×3): 2 via ORAL
  Administered 2012-10-26: 1 via ORAL
  Administered 2012-10-27: 2 via ORAL
  Filled 2012-10-25: qty 2
  Filled 2012-10-25: qty 1
  Filled 2012-10-25: qty 2
  Filled 2012-10-25: qty 1
  Filled 2012-10-25 (×3): qty 2

## 2012-10-25 MED ORDER — ACETAMINOPHEN 10 MG/ML IV SOLN
INTRAVENOUS | Status: DC | PRN
  Administered 2012-10-25: 1000 mg via INTRAVENOUS

## 2012-10-25 MED ORDER — ONDANSETRON HCL 4 MG PO TABS: 4.0000 mg | ORAL_TABLET | Freq: Four times a day (QID) | ORAL | Status: DC | PRN

## 2012-10-25 MED ORDER — MEPERIDINE HCL 50 MG/ML IJ SOLN: 6.2500 mg | INTRAMUSCULAR | Status: DC | PRN

## 2012-10-25 MED ORDER — ACETAMINOPHEN 325 MG PO TABS: 650.0000 mg | ORAL_TABLET | Freq: Four times a day (QID) | ORAL | Status: DC | PRN

## 2012-10-25 MED ORDER — OXYCODONE HCL 5 MG/5ML PO SOLN
5.0000 mg | Freq: Once | ORAL | Status: DC | PRN
  Filled 2012-10-25: qty 5

## 2012-10-25 MED ORDER — FENTANYL CITRATE 0.05 MG/ML IJ SOLN
INTRAMUSCULAR | Status: DC | PRN
  Administered 2012-10-25: 100 ug via INTRAVENOUS
  Administered 2012-10-25 (×3): 50 ug via INTRAVENOUS

## 2012-10-25 SURGICAL SUPPLY — 49 items
BAG ZIPLOCK 12X15 (MISCELLANEOUS) IMPLANT
BANDAGE GAUZE ELAST BULKY 4 IN (GAUZE/BANDAGES/DRESSINGS) ×2 IMPLANT
BIT DRILL CANN LG 4.3MM (BIT) ×1 IMPLANT
CLOTH BEACON ORANGE TIMEOUT ST (SAFETY) ×2 IMPLANT
DERMABOND ADVANCED (GAUZE/BANDAGES/DRESSINGS) ×1
DERMABOND ADVANCED .7 DNX12 (GAUZE/BANDAGES/DRESSINGS) ×1 IMPLANT
DRAPE INCISE IOBAN 66X45 STRL (DRAPES) IMPLANT
DRAPE STERI IOBAN 125X83 (DRAPES) ×2 IMPLANT
DRILL BIT CANN LG 4.3MM (BIT) ×2
DRSG AQUACEL AG ADV 3.5X 4 (GAUZE/BANDAGES/DRESSINGS) IMPLANT
DRSG AQUACEL AG ADV 3.5X 6 (GAUZE/BANDAGES/DRESSINGS) ×4 IMPLANT
DRSG PAD ABDOMINAL 8X10 ST (GAUZE/BANDAGES/DRESSINGS) ×2 IMPLANT
DURAPREP 26ML APPLICATOR (WOUND CARE) ×2 IMPLANT
ELECT REM PT RETURN 9FT ADLT (ELECTROSURGICAL) ×2
ELECTRODE REM PT RTRN 9FT ADLT (ELECTROSURGICAL) ×1 IMPLANT
GLOVE BIOGEL PI IND STRL 6 (GLOVE) ×1 IMPLANT
GLOVE BIOGEL PI IND STRL 7.5 (GLOVE) ×1 IMPLANT
GLOVE BIOGEL PI IND STRL 8 (GLOVE) IMPLANT
GLOVE BIOGEL PI INDICATOR 6 (GLOVE) ×1
GLOVE BIOGEL PI INDICATOR 7.5 (GLOVE) ×1
GLOVE BIOGEL PI INDICATOR 8 (GLOVE)
GLOVE ECLIPSE 8.0 STRL XLNG CF (GLOVE) IMPLANT
GLOVE ORTHO TXT STRL SZ7.5 (GLOVE) ×8 IMPLANT
GLOVE SURG ORTHO 8.0 STRL STRW (GLOVE) IMPLANT
GLOVE SURG SS PI 6.5 STRL IVOR (GLOVE) ×2 IMPLANT
GOWN BRE IMP PREV XXLGXLNG (GOWN DISPOSABLE) IMPLANT
GOWN STRL NON-REIN LRG LVL3 (GOWN DISPOSABLE) ×6 IMPLANT
GUIDEPIN 3.2X17.5 THRD DISP (PIN) ×4 IMPLANT
GUIDEWIRE BALL NOSE 80CM (WIRE) ×2 IMPLANT
HFN 125 DEG 11MM X 180MM (Orthopedic Implant) ×2 IMPLANT
KIT BASIN OR (CUSTOM PROCEDURE TRAY) ×2 IMPLANT
MANIFOLD NEPTUNE II (INSTRUMENTS) IMPLANT
PACK GENERAL/GYN (CUSTOM PROCEDURE TRAY) ×2 IMPLANT
POSITIONER SURGICAL ARM (MISCELLANEOUS) ×2 IMPLANT
SCREW ANTI ROTATION 80MM (Screw) ×2 IMPLANT
SCREW BONE CORTICAL 5.0X36 (Screw) ×2 IMPLANT
SCREW DRILL BIT ANIT ROTATION (BIT) ×2 IMPLANT
SCREW LAG 10.5MMX105MM HFN (Screw) ×2 IMPLANT
SPONGE GAUZE 4X4 12PLY (GAUZE/BANDAGES/DRESSINGS) ×2 IMPLANT
STAPLER VISISTAT 35W (STAPLE) IMPLANT
SUT MNCRL AB 4-0 PS2 18 (SUTURE) ×2 IMPLANT
SUT VIC AB 0 CT1 27 (SUTURE) ×2
SUT VIC AB 0 CT1 27XBRD ANTBC (SUTURE) ×2 IMPLANT
SUT VIC AB 1 CT1 27 (SUTURE) ×1
SUT VIC AB 1 CT1 27XBRD ANTBC (SUTURE) ×1 IMPLANT
SUT VIC AB 2-0 CT1 27 (SUTURE) ×1
SUT VIC AB 2-0 CT1 27XBRD (SUTURE) ×1 IMPLANT
TOWEL OR 17X26 10 PK STRL BLUE (TOWEL DISPOSABLE) ×4 IMPLANT
WATER STERILE IRR 1500ML POUR (IV SOLUTION) ×2 IMPLANT

## 2012-10-25 NOTE — Op Note (Signed)
Brenda [name], Brenda [name]             ACCOUNT NO.:  [id number]  MEDICAL RECORD NO.:  [id number]  LOCATION:  3244                         FACILITY:  [facility]  PHYSICIAN:  Pietro Cassis. Alvan Dame, M.D.  DATE OF BIRTH:  February 18, 1951  DATE OF PROCEDURE:  10/25/2012 DATE OF DISCHARGE:                              OPERATIVE REPORT   PREOPERATIVE DIAGNOSIS:  Comminuted left intertrochanteric femur fracture with basicervical component.  POSTOPERATIVE DIAGNOSIS:  Comminuted left intertrochanteric femur fracture with basicervical component.  PROCEDURE:  Open reduction and internal fixation of left intertrochanteric fracture utilizing Biomet AFFIXUS nail 125 degrees 11 x 180 mm with an anti-rotational screw placed in addition with distal interlock.  SURGEON:  Pietro Cassis. Alvan Dame, M.D.  ASSISTANT:  Surgical team.  ANESTHESIA:  General.  SPECIMENS:  None.  COMPLICATIONS:  None.  BLOOD LOSS:  Less than 200 mL.  INDICATION FOR THE PROCEDURE:  Brenda [name] is a 61 year old [employer] employee who unfortunately had a fall at church at Christmas time.  She fell backwards and onto the right hip, having increased pain in her left hip.  Radiographs in the hospital revealed this comminuted intertrochanteric fracture.  I was asked to assist in definitive management.  I reviewed radiographic patterns and associated planning and reviewing with Brenda [name] the planned procedure, the risks of nonunion, malunion, persistence of discomfort in addition to standard risk of infection, DVT.  She had undergone an ipsilateral left total knee replacement recently.  Consent was obtained for the benefit of fracture management.  PROCEDURE IN DETAIL:  The patient was brought to the operative theater. Once adequate anesthesia preoperative antibiotics, Ancef administered. She was positioned supine.  The right hip was flexed and abducted out of the way with bony prominences well padded particularly the lateral perineal nerve  region.  Left foot was placed in the traction.  Traction internal rotation was applied and fluoroscopy was used to confirm reduction of the fracture.  At this point, the left hip was prepped and draped in sterile fashion from the left iliac wing down to the knee area.  Time-out was performed identifying the patient, planned procedure, and extremity.  A lateral incision was made proximal to the trochanter.  Sharp dissection was carried down to the gluteal fascia, which was incised.  A guidewire was then inserted into the tip of the trochanter, passed into the proximal femur.  The proximal femur was then drilled.  I selected 125-degree angled bolt in an effort to provide correct orientation for the lag screw.  Initial pass of the nail found that due to femoral bowing that the nail was not going to fit rather than impact and I removed the nail, placed a ball-tip guidewire and reamed up to 14 mm.  I was unable to pass the nail by hand to the correct depth.  At this point, with the orientation correct, a guide pin was then inserted into the center of the femoral head in AP and lateral planes and allow for an anti-rotational screw to be placed proximal to this.  We drilled and then passed a 110 mm lag screw, and then compressed this down.  I then placed an anti-rotational screw measuring 85 mm.  A distal  interlock was placed to the jig.  The jig was then removed. Final radiographs were obtained in AP and lateral planes.  I irrigated the wounds.  I reapproximate the proximal wound in layers with #1 Vicryl in the gluteal fascia, 2-0 Vicryl, and then running 4-0 Monocryl.  The remaining wounds were closed in the subcu layer.  The wounds were all cleaned, dried, and dressed sterilely using Dermabond and Aquacel dressing.  She was then brought to the recovery room, extubated in stable condition tolerating the procedure well.     Pietro Cassis Alvan Dame, M.D.     MDO/MEDQ  D:  10/25/2012  T:   10/25/2012  Job:  [id number]

## 2012-10-25 NOTE — Progress Notes (Signed)
[facility] PROGRESS NOTE  Brenda [name] HYW:[id number] DOB: 09/05/1951 DOA: 10/24/2012 PCP: Arnette Norris, MD  Assessment/Plan: Left hip fracture Per Dr. Alvan Dame. Appreciate his management.  Status post left intramedullary nail of femur on 10/25/2012.  PT evaluation pending.  Continue vitamin D.  Would appreciate duration and course of anticoagulation.  Type 2 diabetes Sliding scale insulin.  Continue metformin and linagliptin (substituted for sitagliptan).  Hemoglobin A1c 7.2 suggesting an average blood sugar of 160.  Hypertension Continue benazepril and hydrochlorothiazide.  Depression/anxiety Continue Wellbutrin.  Patient was slightly distressed about needing surgery, she was tearful at times.  Denied any suicidal thoughts or ideation.  Offered to have psychiatry come and discuss with the patient, she declined.  Continue home clonazepam dose as needed.  Hyperlipidemia Continue ezetimibe/simvastatin.  Mild leukocytosis Likely reactive due from femur fracture.  Code Status: Full code Family Communication: No family at bedside. Disposition Plan: Pending PT evaluation.   Consultants:  Dr. Alvan Dame ortho  Procedures:  Left intramedullary nail of femur on 10/25/2012  Antibiotics:  Cefazolin postoperatively on 10/25/2012  HPI/Subjective: Patient tearful about needing surgery.  No other specific concerns.  Objective: Filed Vitals:   10/24/12 2322 10/25/12 0006 10/25/12 0615 10/25/12 1144  BP: 147/80  103/65 102/65  Pulse: 88  75 76  Temp: 98.1 F (36.7 C)  98.1 F (36.7 C) 98.3 F (36.8 C)  TempSrc: Oral  Oral Oral  Resp: 16  14 14   Height:  5' 2"  (1.575 m)    Weight:  88.905 kg (196 lb)    SpO2: 93%  95% 96%    Intake/Output Summary (Last 24 hours) at 10/25/12 1230 Last data filed at 10/25/12 1146  Gross per 24 hour  Intake    300 ml  Output   1100 ml  Net   -800 ml   Filed Weights   10/25/12 0006  Weight: 88.905 kg (196 lb)    Exam: Physical  Exam: General: Awake, Oriented, No acute distress. HEENT: EOMI. Neck: Supple CV: S1 and S2 Lungs: Clear to ascultation bilaterally Abdomen: Soft, Nontender, Nondistended, +bowel sounds. Ext: Good pulses. Trace edema.  Data Reviewed: Basic Metabolic Panel:  Lab 48/54/62 1950  NA 137  K 4.0  CL 105  CO2 24  GLUCOSE 168*  BUN 21  CREATININE 0.98  CALCIUM 9.3  MG --  PHOS --   Liver Function Tests: No results found for this basename: AST:5,ALT:5,ALKPHOS:5,BILITOT:5,PROT:5,ALBUMIN:5 in the last 168 hours No results found for this basename: LIPASE:5,AMYLASE:5 in the last 168 hours No results found for this basename: AMMONIA:5 in the last 168 hours CBC:  Lab 10/24/12 1950  WBC 12.1*  NEUTROABS 7.8*  HGB 12.8  HCT 39.1  MCV 84.6  PLT 180   Cardiac Enzymes: No results found for this basename: CKTOTAL:5,CKMB:5,CKMBINDEX:5,TROPONINI:5 in the last 168 hours BNP (last 3 results) No results found for this basename: PROBNP:3 in the last 8760 hours CBG:  Lab 10/25/12 1210 10/25/12 0714 10/25/12 0359 10/24/12 2115  GLUCAP 111* 166* 182* 161*    No results found for this or any previous visit (from the past 240 hour(s)).   Studies: Dg Chest 1 View  10/24/2012  *RADIOLOGY REPORT*  Clinical Data: Left hip pain post fall, history hypertension, diabetes, endometrial cancer  CHEST - 1 VIEW  Comparison: 12/15/2011  Findings: Enlargement of cardiac silhouette. Calcified tortuous aorta. Pulmonary vascularity normal. Lungs clear. No pleural effusion or pneumothorax. Bones unremarkable.  IMPRESSION: Enlargement of cardiac silhouette. No acute abnormalities.   Original  Report Authenticated By: Lavonia Dana, M.D.    Dg Hip Complete Left  10/24/2012  *RADIOLOGY REPORT*  Clinical Data: Left hip pain post fall  LEFT HIP - COMPLETE 2+ VIEW  Comparison: None  Findings: Osseous demineralization. Symmetric preserved hip and SI joints. Comminuted displaced intertrochanteric fracture left femur with  mild varus angulation. No dislocation seen. Pelvis intact. Small pelvic phleboliths. Scattered atherosclerotic calcification.  IMPRESSION: Comminuted displaced and mildly angulated intertrochanteric fracture left femur. Osseous demineralization.   Original Report Authenticated By: Lavonia Dana, M.D.     Scheduled Meds:   . aspirin  81 mg Oral Daily  . benazepril  20 mg Oral Daily   And  . hydrochlorothiazide  12.5 mg Oral Daily  . buPROPion  300 mg Oral q morning - 10a  .  ceFAZolin (ANCEF) IV  2 g Intravenous 60 min Pre-Op  . chlorhexidine  60 mL Topical Once  . cholecalciferol  1,000 Units Oral Daily  . ezetimibe-simvastatin  1 tablet Oral QHS  . heparin  5,000 Units Subcutaneous Q8H  . insulin aspart  0-15 Units Subcutaneous Q4H  . metFORMIN  1,000 mg Oral BID WC   And  . linagliptin  5 mg Oral Q breakfast  . omega-3 acid ethyl esters  2 g Oral BID  . pantoprazole  20 mg Oral Daily  . potassium chloride  10 mEq Oral Daily   Continuous Infusions:   . sodium chloride Stopped (10/25/12 0059)    Principal Problem:  *Hip fracture, left Active Problems:  DIABETES MELLITUS, TYPE II  HYPERLIPIDEMIA  DEPRESSION  HYPERTENSION  OA (osteoarthritis)    Time spent: 25 mins    [facility] Pager [phone]. If 8PM-8AM, please contact night-coverage at [email or web address], password [facility] 10/25/2012, 12:30 PM  LOS: 1 day

## 2012-10-25 NOTE — Transfer of Care (Signed)
Immediate Anesthesia Transfer of Care Note  Patient: Brenda [name]  Procedure(s) Performed: Procedure(s) (LRB) with comments: INTRAMEDULLARY (IM) NAIL FEMORAL (Left)  Patient Location: PACU  Anesthesia Type:General  Level of Consciousness: awake, alert , oriented and patient cooperative  Airway & Oxygen Therapy: Patient Spontanous Breathing and Patient connected to face mask oxygen  Post-op Assessment: Report given to PACU RN and Post -op Vital signs reviewed and stable  Post vital signs: Reviewed and stable  Complications: No apparent anesthesia complications

## 2012-10-25 NOTE — Brief Op Note (Signed)
10/24/2012 - 10/25/2012  2:38 PM  PATIENT:  Brenda [name]  61 y.o. female  PRE-OPERATIVE DIAGNOSIS:  left intertrochanteric proximal femur fracture  POST-OPERATIVE DIAGNOSIS:  left intertrochanteric proximal femur fracture  PROCEDURE:  Procedure(s) (LRB) with comments: INTRAMEDULLARY (IM) NAIL FEMORAL (Left)  SURGEON:  Surgeon(s) and Role:    * Mauri Pole, MD - Primary  PHYSICIAN ASSISTANT: None  ANESTHESIA:   general  EBL:  Total I/O In: 1000 [I.V.:1000] Out: 800 [Urine:500; Blood:300]  BLOOD ADMINISTERED:none  DRAINS: none   LOCAL MEDICATIONS USED:  NONE  SPECIMEN:  No Specimen  DISPOSITION OF SPECIMEN:  N/A  COUNTS:  YES  TOURNIQUET:  * No tourniquets in log *  DICTATION: .Other Dictation: Dictation Number [id number]  PLAN OF CARE: Admit to inpatient   PATIENT DISPOSITION:  PACU - hemodynamically stable.   Delay start of Pharmacological VTE agent (>24hrs) due to surgical blood loss or risk of bleeding: no

## 2012-10-25 NOTE — Preoperative (Signed)
Beta Blockers   Reason not to administer Beta Blockers:Not Applicable 

## 2012-10-25 NOTE — Consult Note (Signed)
Reason for Consult:  Left hip fracture   Brenda [name] is an 61 y.o. female.  HPI: Brenda [name] is an 61 y.o. 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 While at church she tripped and fell on some steps while checking on the candles. She was then brought to the ER with left hip pain. Evaluation in the ER included Xray with Left comminuted and displaced intertrochanteric Fx. Hospitalist was asked to admit her for left hip Fx. Risks, benefits and expectations were discussed with the patient. Patient understand the risks, benefits and expectations and wishes to proceed with surgery.    Past Medical History  Diagnosis Date  . Depression   . Diabetes mellitus   . GERD (gastroesophageal reflux disease)   . Hyperlipidemia   . Hypertension   . Heart murmur   . Endometrial ca     1998  . Heel spur   . PONV (postoperative nausea and vomiting)     after hysterectomy    Past Surgical History  Procedure Date  . Abdominal hysterectomy 1998  . Left knee arthroscopy 12/2010  . 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 Cancer Mother     breast cancer  . Cancer Father     plasma sarcoma    Social History:  reports that she has never smoked. She has never used smokeless tobacco. She reports that she does not drink alcohol or use illicit drugs.  Allergies:  Allergies  Allergen Reactions  . Beta Adrenergic Blockers     REACTION: Decreased heart rate     Results for orders placed during the hospital encounter of 10/24/12 (from the past 48 hour(s))  CBC WITH DIFFERENTIAL     Status: Abnormal   Collection Time   10/24/12  7:50 PM      Component Value Range Comment   WBC 12.1 (*) 4.0 - 10.5 K/uL    RBC 4.62  3.87 - 5.11 MIL/uL    Hemoglobin 12.8  12.0 - 15.0  g/dL    HCT 39.1  36.0 - 46.0 %    MCV 84.6  78.0 - 100.0 fL    MCH 27.7  26.0 - 34.0 pg    MCHC 32.7  30.0 - 36.0 g/dL    RDW 12.6  11.5 - 15.5 %    Platelets 180  150 - 400 K/uL    Neutrophils Relative 65  43 - 77 %    Neutro Abs 7.8 (*) 1.7 - 7.7 K/uL    Lymphocytes Relative 30  12 - 46 %    Lymphs Abs 3.6  0.7 - 4.0 K/uL    Monocytes Relative 4  3 - 12 %    Monocytes Absolute 0.4  0.1 - 1.0 K/uL    Eosinophils Relative 2  0 - 5 %    Eosinophils Absolute 0.2  0.0 - 0.7 K/uL    Basophils Relative 0  0 - 1 %    Basophils Absolute 0.0  0.0 - 0.1 K/uL   BASIC METABOLIC PANEL     Status: Abnormal   Collection Time   10/24/12  7:50 PM      Component Value Range Comment   Sodium 137  135 - 145 mEq/L  Potassium 4.0  3.5 - 5.1 mEq/L    Chloride 105  96 - 112 mEq/L    CO2 24  19 - 32 mEq/L    Glucose, Bld 168 (*) 70 - 99 mg/dL    BUN 21  6 - 23 mg/dL    Creatinine, Ser 0.98  0.50 - 1.10 mg/dL    Calcium 9.3  8.4 - 10.5 mg/dL    GFR calc non Af Amer 60 (*) >90 mL/min    GFR calc Af Amer 70 (*) >90 mL/min   TYPE AND SCREEN     Status: Normal   Collection Time   10/24/12  7:50 PM      Component Value Range Comment   ABO/RH(D) O POS      Antibody Screen NEG      Sample Expiration 10/27/2012     HEMOGLOBIN A1C     Status: Abnormal   Collection Time   10/24/12  7:50 PM      Component Value Range Comment   Hemoglobin A1C 7.2 (*) <5.7 %    Mean Plasma Glucose 160 (*) <117 mg/dL   URINALYSIS, ROUTINE W REFLEX MICROSCOPIC     Status: Normal   Collection Time   10/24/12  8:09 PM      Component Value Range Comment   Color, Urine YELLOW  YELLOW    APPearance CLEAR  CLEAR    Specific Gravity, Urine 1.027  1.005 - 1.030    pH 5.0  5.0 - 8.0    Glucose, UA NEGATIVE  NEGATIVE mg/dL    Hgb urine dipstick NEGATIVE  NEGATIVE    Bilirubin Urine NEGATIVE  NEGATIVE    Ketones, ur NEGATIVE  NEGATIVE mg/dL    Protein, ur NEGATIVE  NEGATIVE mg/dL    Urobilinogen, UA 0.2  0.0 - 1.0 mg/dL     Nitrite NEGATIVE  NEGATIVE    Leukocytes, UA NEGATIVE  NEGATIVE MICROSCOPIC NOT DONE ON URINES WITH NEGATIVE PROTEIN, BLOOD, LEUKOCYTES, NITRITE, OR GLUCOSE <1000 mg/dL.  GLUCOSE, CAPILLARY     Status: Abnormal   Collection Time   10/24/12  9:15 PM      Component Value Range Comment   Glucose-Capillary 161 (*) 70 - 99 mg/dL    Comment 1 Documented in Chart      Comment 2 Notify RN     GLUCOSE, CAPILLARY     Status: Abnormal   Collection Time   10/25/12  3:59 AM      Component Value Range Comment   Glucose-Capillary 182 (*) 70 - 99 mg/dL   GLUCOSE, CAPILLARY     Status: Abnormal   Collection Time   10/25/12  7:14 AM      Component Value Range Comment   Glucose-Capillary 166 (*) 70 - 99 mg/dL    Comment 1 Notify RN       Dg Chest 1 View  10/24/2012  *RADIOLOGY REPORT*  Clinical Data: Left hip pain post fall, history hypertension, diabetes, endometrial cancer  CHEST - 1 VIEW  Comparison: 12/15/2011  Findings: Enlargement of cardiac silhouette. Calcified tortuous aorta. Pulmonary vascularity normal. Lungs clear. No pleural effusion or pneumothorax. Bones unremarkable.  IMPRESSION: Enlargement of cardiac silhouette. No acute abnormalities.   Original Report Authenticated By: Lavonia Dana, M.D.    Dg Hip Complete Left  10/24/2012  *RADIOLOGY REPORT*  Clinical Data: Left hip pain post fall  LEFT HIP - COMPLETE 2+ VIEW  Comparison: None  Findings: Osseous demineralization. Symmetric preserved hip and SI joints. Comminuted displaced  intertrochanteric fracture left femur with mild varus angulation. No dislocation seen. Pelvis intact. Small pelvic phleboliths. Scattered atherosclerotic calcification.  IMPRESSION: Comminuted displaced and mildly angulated intertrochanteric fracture left femur. Osseous demineralization.   Original Report Authenticated By: Lavonia Dana, M.D.     Review of Systems  Constitutional: Negative.   HENT: Negative.   Eyes: Negative.   Respiratory: Negative.    Cardiovascular: Negative.   Gastrointestinal: Positive for heartburn.  Genitourinary: Negative.   Musculoskeletal: Positive for joint pain and falls.  Skin: Negative.   Neurological: Negative.   Endo/Heme/Allergies: Negative.   Psychiatric/Behavioral: Positive for depression.   Blood pressure 103/65, pulse 75, temperature 98.1 F (36.7 C), temperature source Oral, resp. rate 14, height 5' 2"  (1.575 m), weight 88.905 kg (196 lb), SpO2 95.00%. Physical Exam  Constitutional: She is oriented to person, place, and time. She appears well-developed and well-nourished.  HENT:  Head: Normocephalic and atraumatic.  Mouth/Throat: Oropharynx is clear and moist.  Eyes: Pupils are equal, round, and reactive to light.  Neck: Neck supple. No JVD present. No tracheal deviation present. No thyromegaly present.  Cardiovascular: Normal rate, regular rhythm and intact distal pulses.   Murmur heard. Respiratory: Effort normal and breath sounds normal. No respiratory distress. She has no wheezes.  GI: Soft. There is no tenderness. There is no guarding.  Musculoskeletal:       Left hip: She exhibits decreased range of motion, decreased strength, tenderness and bony tenderness. She exhibits no swelling, no deformity and no laceration.  Lymphadenopathy:    She has no cervical adenopathy.  Neurological: She is alert and oriented to person, place, and time.  Skin: Skin is warm and dry.  Psychiatric: She has a normal mood and affect.    Assessment/Plan: Left intertrochanteric hip fracture  NPO now Plan is to go to the OR today for ORIF , IM nailing of the left hip fracture per Dr. Tomi Likens, Lucille Passy 10/25/2012, 10:48 AM

## 2012-10-25 NOTE — Anesthesia Postprocedure Evaluation (Signed)
Anesthesia Post Note  Patient: Brenda [name]  Procedure(s) Performed: Procedure(s) (LRB): INTRAMEDULLARY (IM) NAIL FEMORAL (Left)  Anesthesia type: General  Patient location: PACU  Post pain: Pain level controlled  Post assessment: Post-op Vital signs reviewed  Last Vitals: BP 124/61  Pulse 87  Temp 37.3 C (Oral)  Resp 10  Ht 5' 2"  (1.575 m)  Wt 196 lb (88.905 kg)  BMI 35.85 kg/m2  SpO2 100%  Post vital signs: Reviewed  Level of consciousness: sedated  Complications: No apparent anesthesia complications

## 2012-10-25 NOTE — Progress Notes (Signed)
10/25/12 0100 Nursing Magick-Myers paged reg: holding of sq Heparin tonight due to patient's early surgery today. No response from Dr Doyle Askew.Dr Marin Comment paged at 2:10 am with sam question regarding Heparin Sq. Dr Marin Comment stated he would page Dr Romilda Joy to handle this question. No response from either Dr as of 230am. Heparin held per protocol for surgical patients

## 2012-10-25 NOTE — Anesthesia Preprocedure Evaluation (Addendum)
Anesthesia Evaluation  Patient identified by MRN, date of birth, ID band Patient awake    Reviewed: Allergy & Precautions, H&P , NPO status , Patient's Chart, lab work & pertinent test results  History of Anesthesia Complications (+) PONV  Airway Mallampati: II TM Distance: >3 FB Neck ROM: Full    Dental No notable dental hx. (+) Dental Advisory Given and Teeth Intact   Pulmonary neg pulmonary ROS,  breath sounds clear to auscultation  Pulmonary exam normal       Cardiovascular hypertension, Pt. on medications - Past MI and - CHF + Valvular Problems/Murmurs Rhythm:Regular Rate:Normal     Neuro/Psych PSYCHIATRIC DISORDERS Depression negative neurological ROS  negative psych ROS   GI/Hepatic Neg liver ROS, GERD-  Medicated,  Endo/Other  diabetes, Type 2, Oral Hypoglycemic AgentsMorbid obesity  Renal/GU negative Renal ROS  negative genitourinary   Musculoskeletal negative musculoskeletal ROS (+)   Abdominal (+) + obese,   Peds negative pediatric ROS (+)  Hematology negative hematology ROS (+)   Anesthesia Other Findings   Reproductive/Obstetrics negative OB ROS                         Anesthesia Physical Anesthesia Plan  ASA: III and emergent  Anesthesia Plan: General   Post-op Pain Management:    Induction:   Airway Management Planned: Oral ETT  Additional Equipment:   Intra-op Plan:   Post-operative Plan: Extubation in OR  Informed Consent: I have reviewed the patients History and Physical, chart, labs and discussed the procedure including the risks, benefits and alternatives for the proposed anesthesia with the patient or authorized representative who has indicated his/her understanding and acceptance.   Dental advisory given  Plan Discussed with: CRNA  Anesthesia Plan Comments:         Anesthesia Quick Evaluation                                   Anesthesia  Evaluation  Patient identified by MRN, date of birth, ID band Patient awake    Reviewed: Allergy & Precautions, H&P , NPO status , Patient's Chart, lab work & pertinent test results  History of Anesthesia Complications (+) PONV  Airway Mallampati: II TM Distance: >3 FB Neck ROM: Full    Dental No notable dental hx.    Pulmonary neg pulmonary ROS,  clear to auscultation  Pulmonary exam normal       Cardiovascular hypertension, Pt. on medications + Valvular Problems/Murmurs Regular Normal    Neuro/Psych Negative Neurological ROS  Negative Psych ROS   GI/Hepatic Neg liver ROS, GERD-  Medicated,  Endo/Other  Diabetes mellitus-, Type 2, Oral Hypoglycemic Agents  Renal/GU negative Renal ROS  Genitourinary negative   Musculoskeletal negative musculoskeletal ROS (+)   Abdominal (+) obese,   Peds negative pediatric ROS (+)  Hematology negative hematology ROS (+)   Anesthesia Other Findings   Reproductive/Obstetrics negative OB ROS                           Anesthesia Physical Anesthesia Plan  ASA: III  Anesthesia Plan: Spinal   Post-op Pain Management:    Induction: Intravenous  Airway Management Planned: Simple Face Mask  Additional Equipment:   Intra-op Plan:   Post-operative Plan: Extubation in OR  Informed Consent: I have reviewed the patients History and Physical, chart, labs and discussed the  procedure including the risks, benefits and alternatives for the proposed anesthesia with the patient or authorized representative who has indicated his/her understanding and acceptance.   Dental advisory given  Plan Discussed with: CRNA  Anesthesia Plan Comments: (Discussed risks/benefits of spinal including headache, backache, failure, bleeding, infection, and nerve damage. Patient consents to spinal. Questions answered. Coagulation studies and platelet count acceptable. )      Anesthesia Quick Evaluation

## 2012-10-26 LAB — URINE CULTURE: Colony Count: NO GROWTH

## 2012-10-26 LAB — BASIC METABOLIC PANEL
CO2: 25 mEq/L (ref 19–32)
Calcium: 8.6 mg/dL (ref 8.4–10.5)
Chloride: 102 mEq/L (ref 96–112)
Creatinine, Ser: 0.85 mg/dL (ref 0.50–1.10)
GFR calc Af Amer: 83 mL/min — ABNORMAL LOW (ref >90)
Glucose, Bld: 180 mg/dL — ABNORMAL HIGH (ref 70–99)
Potassium: 4.6 mEq/L (ref 3.5–5.1)
Sodium: 136 mEq/L (ref 135–145)

## 2012-10-26 LAB — GLUCOSE, CAPILLARY
Glucose-Capillary: 155 mg/dL — ABNORMAL HIGH (ref 70–99)
Glucose-Capillary: 166 mg/dL — ABNORMAL HIGH (ref 70–99)
Glucose-Capillary: 103 mg/dL — ABNORMAL HIGH (ref 70–99)
Glucose-Capillary: 181 mg/dL — ABNORMAL HIGH (ref 70–99)

## 2012-10-26 LAB — CBC
Hemoglobin: 10.3 g/dL — ABNORMAL LOW (ref 12.0–15.0)
RBC: 3.68 MIL/uL — ABNORMAL LOW (ref 3.87–5.11)

## 2012-10-26 MED ORDER — INSULIN ASPART 100 UNIT/ML ~~LOC~~ SOLN
0.0000 [IU] | Freq: Three times a day (TID) | SUBCUTANEOUS | Status: DC
  Administered 2012-10-27: 2 [IU] via SUBCUTANEOUS

## 2012-10-26 MED ORDER — HYDROCODONE-ACETAMINOPHEN 5-325 MG PO TABS: 1.0000 | ORAL_TABLET | Freq: Four times a day (QID) | ORAL | Status: DC | PRN

## 2012-10-26 MED ORDER — POLYETHYLENE GLYCOL 3350 17 G PO PACK: 17.0000 g | PACK | Freq: Every day | ORAL | Status: DC | PRN

## 2012-10-26 MED ORDER — METHOCARBAMOL 500 MG PO TABS: 500.0000 mg | ORAL_TABLET | Freq: Four times a day (QID) | ORAL | Status: DC | PRN

## 2012-10-26 MED ORDER — ENOXAPARIN SODIUM 40 MG/0.4ML ~~LOC~~ SOLN: 40.0000 mg | SUBCUTANEOUS | Status: DC

## 2012-10-26 MED ORDER — FERROUS SULFATE 325 (65 FE) MG PO TABS: 325.0000 mg | ORAL_TABLET | Freq: Three times a day (TID) | ORAL | Status: DC

## 2012-10-26 MED ORDER — INSULIN ASPART 100 UNIT/ML ~~LOC~~ SOLN
0.0000 [IU] | Freq: Three times a day (TID) | SUBCUTANEOUS | Status: DC
  Administered 2012-10-26: 3 [IU] via SUBCUTANEOUS

## 2012-10-26 MED ORDER — INSULIN ASPART 100 UNIT/ML ~~LOC~~ SOLN: 0.0000 [IU] | Freq: Every day | SUBCUTANEOUS | Status: DC

## 2012-10-26 MED ORDER — DSS 100 MG PO CAPS: 100.0000 mg | ORAL_CAPSULE | Freq: Two times a day (BID) | ORAL | Status: DC

## 2012-10-26 NOTE — Evaluation (Signed)
Occupational Therapy Evaluation Patient Details Name: Brenda [name] MRN: [id number] DOB: Jul 15, 1951 Today's Date: 10/26/2012 Time: 1010-1035 OT Time Calculation (min): 25 min  OT Assessment / Plan / Recommendation Clinical Impression  This  61 year old female was admitted after a fall resulting in L hip fx.  She underwent IM nail and is PWB.  Pt will benefit from skilled OT to increase safety and independence with adls to reach a supervision level    OT Assessment  Patient needs continued OT Services    Follow Up Recommendations  No OT follow up (likely)    Barriers to Discharge      Equipment Recommendations  None recommended by OT    Recommendations for Other Services    Frequency  Min 2X/week    Precautions / Restrictions Precautions Precautions: Fall Restrictions Weight Bearing Restrictions: Yes LLE Weight Bearing: Partial weight bearing LLE Partial Weight Bearing Percentage or Pounds: 50   Pertinent Vitals/Pain No pain initially; had pain in L hip after OT eval:  Not rated.  Repositioned in bed with ice.  RN notified.    ADL  Grooming: Simulated;Set up Where Assessed - Grooming: Unsupported sitting Upper Body Bathing: Simulated;Set up Where Assessed - Upper Body Bathing: Unsupported sitting Lower Body Bathing: Minimal assistance;Performed (with ae) Where Assessed - Lower Body Bathing: Supported sit to stand Upper Body Dressing: Simulated;Set up Where Assessed - Upper Body Dressing: Unsupported sitting Lower Body Dressing: Simulated;Minimal assistance (with AE) Where Assessed - Lower Body Dressing: Supported sit to stand Toilet Transfer: Performed;Min guard Armed forces technical officer Method: Sit to Loss adjuster, chartered: Comfort height toilet;Grab bars Toileting - Water quality scientist and Hygiene: Simulated;Min guard Where Assessed - Best boy and Hygiene: Sit to stand from 3-in-1 or toilet Transfers/Ambulation Related to ADLs: ambulated  to bathroom observing pwb.  Pt feels comfortable with shower transfer from having knee sx ADL Comments: used reacher for pants (simulated).  Educated on sock aid and pt used this.Recommend long sponge/netted sponge for feet    OT Diagnosis: Generalized weakness  OT Problem List: Decreased strength;Decreased activity tolerance;Decreased knowledge of use of DME or AE;Pain OT Treatment Interventions: Self-care/ADL training;DME and/or AE instruction;Patient/family education;Therapeutic activities   OT Goals Acute Rehab OT Goals OT Goal Formulation: With patient Time For Goal Achievement: 11/02/12 Potential to Achieve Goals: Good ADL Goals Pt Will Perform Lower Body Bathing: with supervision;Sit to stand from chair;with adaptive equipment ADL Goal: Lower Body Bathing - Progress: Goal set today Pt Will Perform Lower Body Dressing: with supervision;Sit to stand from chair;with adaptive equipment ADL Goal: Lower Body Dressing - Progress: Goal set today Pt Will Transfer to Toilet: with supervision;Ambulation;Regular height toilet;Grab bars ADL Goal: Toilet Transfer - Progress: Goal set today Pt Will Perform Toileting - Hygiene: with supervision;Sit to stand from 3-in-1/toilet (and clothes management) ADL Goal: Toileting - Hygiene - Progress: Goal set today Pt Will Perform Tub/Shower Transfer: Shower transfer;with supervision;Ambulation;Grab bars ADL Goal: Tub/Shower Transfer - Progress: Goal set today Miscellaneous OT Goals Miscellaneous OT Goal #1: Pt will gather clothes with RW and reacher at supervision level OT Goal: Miscellaneous Goal #1 - Progress: Goal set today  Visit Information  Last OT Received On: 10/26/12 Assistance Needed: +1    Subjective Data  Subjective: I have a reacher at home, but I didn't need it for my clothes Patient Stated Goal: get back to being independent   Prior Functioning     Home Living Lives With: Alone Available Help at Discharge: Family Type of Home:  House Home Access: Stairs to enter Technical brewer of Steps: 3+1 Entrance Stairs-Rails: Right Home Layout: One level Bathroom Shower/Tub: Walk-in shower (2 grab bars) Bathroom Toilet: Standard (grab bar in front) Home Adaptive Equipment: Straight cane Prior Function Level of Independence: Independent Able to Take Stairs?: Yes Driving: Yes Vocation: Full time employment Comments: Therapist, 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: No difficulties Dominant Hand: Right (uses both)         Vision/Perception     Cognition  Overall Cognitive Status: Appears within functional limits for tasks assessed/performed Arousal/Alertness: Awake/alert Orientation Level: Appears intact for tasks assessed Behavior During Session: [facility] for tasks performed    Extremity/Trunk Assessment Right Upper Extremity Assessment RUE ROM/Strength/Tone: [facility] for tasks assessed Left Upper Extremity Assessment LUE ROM/Strength/Tone: WFL for tasks assessed Right Lower Extremity Assessment RLE ROM/Strength/Tone: [facility] for tasks assessed Left Lower Extremity Assessment LLE ROM/Strength/Tone: Deficits LLE ROM/Strength/Tone Deficits: Hip strength 2+/5 with AAROM to 80 flex and 20 abd     Mobility Bed Mobility Bed Mobility: Supine to Sit Supine to Sit: 4: Min assist Sit to Supine: 4: Min assist Details for Bed Mobility Assistance: cues for sequence and use of R LE to self assist Transfers Sit to Stand: 4: Min assist Stand to Sit: 4: Min assist Details for Transfer Assistance: cues for ue placement     Shoulder Instructions     Exercise    Balance     End of Session OT - End of Session Activity Tolerance: Patient tolerated treatment well Patient left: in bed;with call bell/phone within reach;with family/visitor present Nurse Communication: Patient requests pain meds  GO     Tannar Broker 10/26/2012, 10:50 AM Lesle Chris, OTR/L [phone] 10/26/2012

## 2012-10-26 NOTE — Evaluation (Signed)
Physical Therapy Evaluation Patient Details Name: Brenda [name] MRN: [id number] DOB: Jul 19, 1951 Today's Date: 10/26/2012 Time: 0830-0910 PT Time Calculation (min): 40 min  PT Assessment / Plan / Recommendation Clinical Impression  Pt s/p hip fx with ORIF presents with decreased L LE strength/ROM, PWB, and post op pain limiting functional mobility    PT Assessment  Patient needs continued PT services    Follow Up Recommendations  Home health PT    Does the patient have the potential to tolerate intense rehabilitation      Barriers to Discharge Decreased caregiver support      Equipment Recommendations  Rolling walker with 5" wheels (pt is 5'0 - 5'1)    Recommendations for Other Services OT consult   Frequency 7X/week    Precautions / Restrictions Precautions Precautions: Fall Restrictions Weight Bearing Restrictions: Yes LLE Weight Bearing: Partial weight bearing LLE Partial Weight Bearing Percentage or Pounds: 50   Pertinent Vitals/Pain 5/10 with activity; premedicated, ice pack provided      Mobility  Bed Mobility Bed Mobility: Supine to Sit Supine to Sit: 4: Min assist Details for Bed Mobility Assistance: cues for sequence and use of R LE to self assist Transfers Transfers: Sit to Stand;Stand to Sit Sit to Stand: 4: Min assist Stand to Sit: 4: Min assist Details for Transfer Assistance: cues for LE management and use of UEs to self assist Ambulation/Gait Ambulation/Gait Assistance: 4: Min assist Ambulation Distance (Feet): 74 Feet Assistive device: Rolling walker Ambulation/Gait Assistance Details: cues for sequence, posture, stride length, and position from RW Gait Pattern: Step-to pattern    Shoulder Instructions     Exercises Total Joint Exercises Ankle Circles/Pumps: AROM;10 reps;Supine;Both Quad Sets: AROM;10 reps;Both;Supine Heel Slides: AAROM;15 reps;Supine;Left Hip ABduction/ADduction: AAROM;10 reps;Supine;Right   PT Diagnosis: Difficulty  walking  PT Problem List: Decreased strength;Decreased range of motion;Decreased activity tolerance;Decreased mobility;Decreased knowledge of use of DME;Pain;Decreased knowledge of precautions PT Treatment Interventions: DME instruction;Gait training;Stair training;Functional mobility training;Therapeutic activities;Therapeutic exercise;Patient/family education   PT Goals Acute Rehab PT Goals PT Goal Formulation: With patient Time For Goal Achievement: 10/30/12 Potential to Achieve Goals: Good Pt will go Supine/Side to Sit: with modified independence PT Goal: Supine/Side to Sit - Progress: Goal set today Pt will go Sit to Supine/Side: with modified independence PT Goal: Sit to Supine/Side - Progress: Goal set today Pt will go Sit to Stand: with modified independence PT Goal: Sit to Stand - Progress: Goal set today Pt will go Stand to Sit: with modified independence PT Goal: Stand to Sit - Progress: Goal set today Pt will Ambulate: 51 - 150 feet;with modified independence;with rolling walker PT Goal: Ambulate - Progress: Goal set today Pt will Go Up / Down Stairs: 3-5 stairs;with min assist;with least restrictive assistive device PT Goal: Up/Down Stairs - Progress: Goal set today  Visit Information  Last PT Received On: 06/30/12 Assistance Needed: +1    Subjective Data  Subjective: I fell at church.  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    Cognition  Overall Cognitive  Status: Appears within functional limits for  tasks assessed/performed Arousal/Alertness: Awake/alert Orientation Level: Appears intact for tasks assessed Behavior During Session: [facility] for tasks performed    Extremity/Trunk Assessment Right Upper Extremity Assessment RUE ROM/Strength/Tone: [facility] for tasks assessed Left Upper Extremity Assessment LUE ROM/Strength/Tone: WFL for tasks assessed Right Lower Extremity Assessment RLE ROM/Strength/Tone: [facility] for tasks assessed Left Lower Extremity Assessment LLE ROM/Strength/Tone: Deficits LLE ROM/Strength/Tone Deficits: Hip strength 2+/5 with AAROM to 80 flex and 20 abd   Balance    End of Session PT - End of Session Activity Tolerance: Patient tolerated treatment well  GP     Auren Valdes 10/26/2012, 10:29 AM

## 2012-10-26 NOTE — Progress Notes (Addendum)
[facility] PROGRESS NOTE  Brenda [name] HAL:[id number] DOB: [birth year] DOA: 10/24/2012 PCP: Arnette Norris, MD  Assessment/Plan: Left hip fracture Per Dr. Alvan Dame. Appreciate his management.  Status post left intramedullary nail of femur on 10/25/2012.  PT evaluation recommending home health PT.  Continue vitamin D.  Lovenox for 14 days.  Continue medications for pain control. 50% WB left leg.  Type 2 diabetes Sliding scale insulin.  Continue metformin and linagliptin (substituted for sitagliptan).  Hemoglobin A1c 7.2 suggesting an average blood sugar of 160.  Stable.  Hypertension Continue benazepril and hydrochlorothiazide.  Stable.  Depression/anxiety Continue Wellbutrin.  Continue home clonazepam dose as needed.  More composed and doing better.  Hyperlipidemia Continue ezetimibe/simvastatin.  Mild leukocytosis Likely reactive due from femur fracture.  Code Status: Full code Family Communication: No family at bedside. Disposition Plan: Consider discharge home with home health physical therapy.   Consultants:  Dr. Alvan Dame ortho  Procedures:  Left intramedullary nail of femur on 10/25/2012  Antibiotics:  Cefazolin postoperatively on 10/25/2012  HPI/Subjective: Having hip pain after working with surgery.  No specific concerns.  Objective: Filed Vitals:   10/26/12 0152 10/26/12 0457 10/26/12 0800 10/26/12 1052  BP: 106/66 106/68  129/80  Pulse: 83 77  95  Temp: 98.6 F (37 C) 98.6 F (37 C)  99.1 F (37.3 C)  TempSrc: Oral Oral  Oral  Resp: 16 16 16 16   Height:      Weight:      SpO2: 98% 99% 96% 95%    Intake/Output Summary (Last 24 hours) at 10/26/12 1147 Last data filed at 10/26/12 0756  Gross per 24 hour  Intake 3372.95 ml  Output   2580 ml  Net 792.95 ml   Filed Weights   10/25/12 0006  Weight: 88.905 kg (196 lb)    Exam: Physical Exam: General: Awake, Oriented, No acute distress. HEENT: EOMI. Neck: Supple CV: S1 and S2 Lungs: Clear  to ascultation bilaterally Abdomen: Soft, Nontender, Nondistended, +bowel sounds. Ext: Good pulses. Trace edema.  Data Reviewed: Basic Metabolic Panel:  Lab 73/53/29 0433 10/24/12 1950  NA 136 137  K 4.6 4.0  CL 102 105  CO2 25 24  GLUCOSE 180* 168*  BUN 14 21  CREATININE 0.85 0.98  CALCIUM 8.6 9.3  MG -- --  PHOS -- --   Liver Function Tests: No results found for this basename: AST:5,ALT:5,ALKPHOS:5,BILITOT:5,PROT:5,ALBUMIN:5 in the last 168 hours No results found for this basename: LIPASE:5,AMYLASE:5 in the last 168 hours No results found for this basename: AMMONIA:5 in the last 168 hours CBC:  Lab 10/26/12 0433 10/24/12 1950  WBC 10.8* 12.1*  NEUTROABS -- 7.8*  HGB 10.3* 12.8  HCT 31.4* 39.1  MCV 85.3 84.6  PLT 164 180   Cardiac Enzymes: No results found for this basename: CKTOTAL:5,CKMB:5,CKMBINDEX:5,TROPONINI:5 in the last 168 hours BNP (last 3 results) No results found for this basename: PROBNP:3 in the last 8760 hours CBG:  Lab 10/26/12 0719 10/26/12 0452 10/25/12 2354 10/25/12 2039 10/25/12 1641  GLUCAP 166* 155* 187* 211* 208*    Recent Results (from the past 240 hour(s))  URINE CULTURE     Status: Normal   Collection Time   10/24/12  8:09 PM      Component Value Range Status Comment   Specimen Description URINE, CATHETERIZED   Final    Special Requests NONE   Final    Culture  Setup Time 10/25/2012 02:31   Final    Colony Count NO GROWTH  Final    Culture NO GROWTH   Final    Report Status 10/26/2012 FINAL   Final      Studies: Dg Chest 1 View  10/24/2012  *RADIOLOGY REPORT*  Clinical Data: Left hip pain post fall, history hypertension, diabetes, endometrial cancer  CHEST - 1 VIEW  Comparison: 12/15/2011  Findings: Enlargement of cardiac silhouette. Calcified tortuous aorta. Pulmonary vascularity normal. Lungs clear. No pleural effusion or pneumothorax. Bones unremarkable.  IMPRESSION: Enlargement of cardiac silhouette. No acute abnormalities.    Original Report Authenticated By: Lavonia Dana, M.D.    Dg Hip Complete Left  10/24/2012  *RADIOLOGY REPORT*  Clinical Data: Left hip pain post fall  LEFT HIP - COMPLETE 2+ VIEW  Comparison: None  Findings: Osseous demineralization. Symmetric preserved hip and SI joints. Comminuted displaced intertrochanteric fracture left femur with mild varus angulation. No dislocation seen. Pelvis intact. Small pelvic phleboliths. Scattered atherosclerotic calcification.  IMPRESSION: Comminuted displaced and mildly angulated intertrochanteric fracture left femur. Osseous demineralization.   Original Report Authenticated By: Lavonia Dana, M.D.    Dg Femur Left  10/25/2012  *RADIOLOGY REPORT*  Clinical Data: Left hip fracture  LEFT FEMUR - 2 VIEW  Comparison: 10/24/2012  Findings: Four view submitted for interpretation. The patient is status post intraoperative repair of the left femoral fracture.  A intramedullary fixation rod noted proximal left femur.  Two metallic pins are noted in proximal left femur.  There is anatomic alignment.  IMPRESSION: The patient is status post intraoperative repair of the left femoral fracture.  A intramedullary fixation rod noted proximal left femur.  Two metallic pins are noted in proximal left femur. There is anatomic alignment.   Original Report Authenticated By: Lahoma Crocker, M.D.    Dg C-arm 1-60 Min-no Report  10/25/2012  CLINICAL DATA: left femoral hip fracture   C-ARM 1-60 MINUTES  Fluoroscopy was utilized by the requesting physician.  No radiographic  interpretation.      Scheduled Meds:    . aspirin  81 mg Oral Daily  . benazepril  20 mg Oral Daily   And  . hydrochlorothiazide  12.5 mg Oral Daily  . buPROPion  300 mg Oral q morning - 10a  . cholecalciferol  1,000 Units Oral Daily  . docusate sodium  100 mg Oral BID  . enoxaparin (LOVENOX) injection  40 mg Subcutaneous Q24H  . ezetimibe-simvastatin  1 tablet Oral QHS  . insulin aspart  0-15 Units Subcutaneous Q4H  .  metFORMIN  1,000 mg Oral BID WC   And  . linagliptin  5 mg Oral Q breakfast  . omega-3 acid ethyl esters  2 g Oral BID  . pantoprazole  20 mg Oral Daily  . potassium chloride  10 mEq Oral Daily   Continuous Infusions:    . sodium chloride 0.9 % 1,000 mL with potassium chloride 10 mEq infusion 75 mL/hr at 10/26/12 7672    Principal Problem:  *Hip fracture, left Active Problems:  DIABETES MELLITUS, TYPE II  HYPERLIPIDEMIA  DEPRESSION  HYPERTENSION  OA (osteoarthritis)    Time spent: 25 mins    [facility] Pager [phone]. If 8PM-8AM, please contact night-coverage at [email or web address], password [email or web address] 10/26/2012, 11:47 AM  LOS: 2 days

## 2012-10-26 NOTE — Progress Notes (Signed)
Physical Therapy Treatment Patient Details Name: Brenda [name] MRN: [id number] DOB: June 04, 1951 Today's Date: 10/26/2012 Time: 0865-7846 PT Time Calculation (min): 36 min  PT Assessment / Plan / Recommendation Comments on Treatment Session  Pt progressing well.  Will attempt stairs in am    Follow Up Recommendations  Home health PT     Does the patient have the potential to tolerate intense rehabilitation     Barriers to Discharge        Equipment Recommendations  Rolling walker with 5" wheels    Recommendations for Other Services OT consult  Frequency 7X/week   Plan Discharge plan remains appropriate    Precautions / Restrictions Precautions Precautions: Fall Restrictions LLE Weight Bearing: Partial weight bearing LLE Partial Weight Bearing Percentage or Pounds: 50   Pertinent Vitals/Pain     Mobility  Bed Mobility Bed Mobility: Supine to Sit;Sit to Supine Supine to Sit: 4: Min assist Sit to Supine: 4: Min assist Details for Bed Mobility Assistance: cues for sequence and use of R LE to self assist; educated pt on self assisting L LE with sheet/towel/belt/leglifter Transfers Transfers: Sit to Stand;Stand to Sit Sit to Stand: 4: Min guard Stand to Sit: 4: Min guard Details for Transfer Assistance: cues for ue placement Ambulation/Gait Ambulation/Gait Assistance: 4: Min guard Ambulation Distance (Feet): 123 Feet Assistive device: Rolling walker Ambulation/Gait Assistance Details: cues for posture, stride length, position from RW and ER on L Gait Pattern: Step-to pattern    Exercises     PT Diagnosis:    PT Problem List:   PT Treatment Interventions:     PT Goals Acute Rehab PT Goals PT Goal Formulation: With patient Time For Goal Achievement: 10/30/12 Potential to Achieve Goals: Good Pt will go Supine/Side to Sit: with modified independence PT Goal: Supine/Side to Sit - Progress: Progressing toward goal Pt will go Sit to Supine/Side: with modified  independence PT Goal: Sit to Supine/Side - Progress: Progressing toward goal Pt will go Sit to Stand: with modified independence PT Goal: Sit to Stand - Progress: Progressing toward goal Pt will go Stand to Sit: with modified independence PT Goal: Stand to Sit - Progress: Progressing toward goal Pt will Ambulate: 51 - 150 feet;with modified independence;with rolling walker PT Goal: Ambulate - Progress: Progressing toward goal Pt will Go Up / Down Stairs: 3-5 stairs;with min assist;with least restrictive assistive device  Visit Information  Last PT Received On: 10/26/12 Assistance Needed: +1    Subjective Data  Subjective: I know you want me to get up and move 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

## 2012-10-26 NOTE — Progress Notes (Signed)
   Subjective: 1 Day Post-Op Procedure(s) (LRB): INTRAMEDULLARY (IM) NAIL FEMORAL (Left)   Patient reports pain as mild, pain well controlled. States that she feels a lot better than prior to surgery. No events throughout the night.  Objective:   VITALS:   Filed Vitals:   10/26/12 0457  BP: 106/68  Pulse: 77  Temp: 98.6 F (37 C)  Resp: 16    Neurovascular intact Dorsiflexion/Plantar flexion intact Incision: dressing C/D/I No cellulitis present Compartment soft  LABS  Basename 10/26/12 0433 10/24/12 1950  HGB 10.3* 12.8  HCT 31.4* 39.1  WBC 10.8* 12.1*  PLT 164 180     Basename 10/26/12 0433 10/24/12 1950  NA 136 137  K 4.6 4.0  BUN 14 21  CREATININE 0.85 0.98  GLUCOSE 180* 168*     Assessment/Plan: 1 Day Post-Op Procedure(s) (LRB): INTRAMEDULLARY (IM) NAIL FEMORAL (Left)  Advance diet Up with therapy Discharge home with home health eventually when ready 50% WB left leg Dressing may stay on for 10-14 days Follow up in 2 weeks at [facility]. Follow up with OLIN,Kaicee Scarpino D in 2 weeks.  Contact information:  [facility] [address], [address] [place]   PAC  10/26/2012, 7:39 AM  \

## 2012-10-26 NOTE — Progress Notes (Signed)
Chart review performed.  Spoke to patient about hhc. She is not happy if the services provided by [facility].  Unable to discuss further with her at this time.  Will need list of hhc providers if needed.

## 2012-10-27 ENCOUNTER — Encounter (HOSPITAL_COMMUNITY): Payer: Self-pay | Admitting: Orthopedic Surgery

## 2012-10-27 LAB — GLUCOSE, CAPILLARY
Glucose-Capillary: 138 mg/dL — ABNORMAL HIGH (ref 70–99)
Glucose-Capillary: 111 mg/dL — ABNORMAL HIGH (ref 70–99)

## 2012-10-27 LAB — CBC
MCHC: 33.3 g/dL (ref 30.0–36.0)
Platelets: 151 10*3/uL (ref 150–400)
RDW: 12.7 % (ref 11.5–15.5)
WBC: 8.9 10*3/uL (ref 4.0–10.5)

## 2012-10-27 LAB — BASIC METABOLIC PANEL
Chloride: 103 mEq/L (ref 96–112)
GFR calc Af Amer: 80 mL/min — ABNORMAL LOW (ref >90)
GFR calc non Af Amer: 69 mL/min — ABNORMAL LOW (ref >90)
Potassium: 3.6 mEq/L (ref 3.5–5.1)
Sodium: 137 mEq/L (ref 135–145)

## 2012-10-27 MED ORDER — METHOCARBAMOL 500 MG PO TABS: 500.0000 mg | ORAL_TABLET | Freq: Four times a day (QID) | ORAL | Status: DC | PRN

## 2012-10-27 MED ORDER — HYDROCODONE-ACETAMINOPHEN 5-325 MG PO TABS: 1.0000 | ORAL_TABLET | ORAL | Status: DC | PRN

## 2012-10-27 MED ORDER — HYDROCODONE-ACETAMINOPHEN 5-325 MG PO TABS
1.0000 | ORAL_TABLET | ORAL | Status: DC | PRN
  Administered 2012-10-27: 2 via ORAL
  Filled 2012-10-27 (×2): qty 2

## 2012-10-27 MED ORDER — ENOXAPARIN SODIUM 40 MG/0.4ML ~~LOC~~ SOLN: 40.0000 mg | SUBCUTANEOUS | Status: DC

## 2012-10-27 NOTE — Progress Notes (Signed)
CARE MANAGEMENT NOTE 10/27/2012  Patient:  Brenda [name], Brenda [name]   Account Number:  [id number]  Date Initiated:  10/26/2012  Documentation initiated by:  DAVIS,RHONDA  Subjective/Objective Assessment:   PT SUSTAINED FRACTURE OF HIP WITH FALL ORIF PERFORMED     Action/Plan:   HOME WITH HHC- WILL NEED SUGGESTIONS, DID USE [facility] BUT WAS NOT HAPPY WITH THEM.  dR/ Alvan Dame was [employer].   Anticipated DC Date:  10/29/2012   Anticipated DC Plan:  [place]  In-house referral  NA      DC Planning Services  CM consult      [facility] Choice  HOME HEALTH   Choice offered to / List presented to:  C-1 Patient   DME arranged  Vassie Moselle      DME agency  [place] arranged  [place].   Status of service:  Completed, signed off Medicare Important Message given?   (If response is "NO", the following Medicare IM given date fields will be blank) Date Medicare IM given:   Date Additional Medicare IM given:    Discharge Disposition:  [place]  Per UR Regulation:  Reviewed for med. necessity/level of care/duration of stay  If discussed at [place] of Stay Meetings, dates discussed:    Comments:  10/27/2012 Sherrin Daisy BSN RN CCM [phone] Pt is [facility] EE. Wants [facility] for [facility] services. Rep notified and they will provide [facility] services. RW delivered to patient's room. Planms are for her to return to her home where friends and neighbors will be caregivers.

## 2012-10-27 NOTE — Progress Notes (Signed)
   Subjective: 2 Days Post-Op Procedure(s) (LRB): INTRAMEDULLARY (IM) NAIL FEMORAL (Left)   Patient reports pain as mild, pain well controlled with medication, but pain returns before the 6 hours is up. Otherwise she is a little sore from PT, but doing well and no events throughout the night.   Objective:   VITALS:   Filed Vitals:   10/27/12 0758  BP: 111/65  Pulse: 86  Temp: 98.4 F (36.9 C)   Resp: 16    Neurovascular intact Dorsiflexion/Plantar flexion intact Incision: dressing C/D/I No cellulitis present Compartment soft  LABS  Basename 10/27/12 0422 10/26/12 0433 10/24/12 1950  HGB 10.0* 10.3* 12.8  HCT 30.0* 31.4* 39.1  WBC 8.9 10.8* 12.1*  PLT 151 164 180     Basename 10/27/12 0422 10/26/12 0433 10/24/12 1950  NA 137 136 137  K 3.6 4.6 4.0  BUN 16 14 21   CREATININE 0.87 0.85 0.98  GLUCOSE 159* 180* 168*     Assessment/Plan: 2 Days Post-Op Procedure(s) (LRB): INTRAMEDULLARY (IM) NAIL FEMORAL (Left)  Up with therapy Discharge home with home health when ready medically Orthopaedically stable 50% WB left leg  Lovenox for 12 more days, Rx on chart Robaxin for muscle spasms, Rx on chart Norco for pain management, Rx on chart Dressing may stay on for 10-14 days  Follow up in 2 weeks at [facility].     Follow up with OLIN,Christy Ehrsam D in 2 weeks.   Contact information:      [facility]     [address]     [place] Eshika Reckart   PAC  10/27/2012, 8:06 AM

## 2012-10-27 NOTE — Progress Notes (Signed)
[facility] PROGRESS NOTE  Brenda [name] XNA:[id number] DOB: 1951-04-17 DOA: 10/24/2012 PCP: Arnette Norris, MD  Assessment/Plan: Left hip fracture Per Dr. Alvan Dame. Appreciate his management.  Status post left intramedullary nail of femur on 10/25/2012.  PT evaluation recommending home health PT.  Continue vitamin D.  Lovenox for 14 days.  Continue medications for pain control. 50% WB left leg.  Type 2 diabetes Sliding scale insulin.  Continue metformin and linagliptin (substituted for sitagliptan), resume home medications at discharge.  Hemoglobin A1c 7.2 suggesting an average blood sugar of 160.  Stable.  Hypertension Continue benazepril and hydrochlorothiazide.  Stable.  Depression/anxiety Continue Wellbutrin.  Continue home clonazepam dose as needed.  Stable.  Hyperlipidemia Continue ezetimibe/simvastatin.  Mild leukocytosis Likely reactive due from femur fracture.  Anemia Likely due to blood loss from hip fracture.  Code Status: Full code Family Communication: No family at bedside. Disposition Plan: Discharge the patient with home health.   Consultants:  Dr. Alvan Dame ortho  Procedures:  Left intramedullary nail of femur on 10/25/2012  Antibiotics:  Cefazolin postoperatively on 10/25/2012  HPI/Subjective: Feeling better today. Wanted to go home.  Objective: Filed Vitals:   10/26/12 1600 10/26/12 2139 10/27/12 0515 10/27/12 0758  BP:  124/69 111/65   Pulse:  101 86   Temp:  99.1 F (37.3 C) 98.4 F (36.9 C)   TempSrc:  Oral Oral   Resp: 16 16 16 16   Height:      Weight:      SpO2: 95% 92% 95% 95%    Intake/Output Summary (Last 24 hours) at 10/27/12 1015 Last data filed at 10/27/12 0515  Gross per 24 hour  Intake 1571.25 ml  Output   1975 ml  Net -403.75 ml   Filed Weights   10/25/12 0006  Weight: 88.905 kg (196 lb)    Exam: Physical Exam: General: Awake, Oriented, No acute distress. HEENT: EOMI. Neck: Supple CV: S1 and S2 Lungs: Clear  to ascultation bilaterally Abdomen: Soft, Nontender, Nondistended, +bowel sounds. Ext: Good pulses. Trace edema.  Data Reviewed: Basic Metabolic Panel:  Lab 54/27/06 0422 10/26/12 0433 10/24/12 1950  NA 137 136 137  K 3.6 4.6 4.0  CL 103 102 105  CO2 25 25 24   GLUCOSE 159* 180* 168*  BUN 16 14 21   CREATININE 0.87 0.85 0.98  CALCIUM 8.7 8.6 9.3  MG -- -- --  PHOS -- -- --   Liver Function Tests: No results found for this basename: AST:5,ALT:5,ALKPHOS:5,BILITOT:5,PROT:5,ALBUMIN:5 in the last 168 hours No results found for this basename: LIPASE:5,AMYLASE:5 in the last 168 hours No results found for this basename: AMMONIA:5 in the last 168 hours CBC:  Lab 10/27/12 0422 10/26/12 0433 10/24/12 1950  WBC 8.9 10.8* 12.1*  NEUTROABS -- -- 7.8*  HGB 10.0* 10.3* 12.8  HCT 30.0* 31.4* 39.1  MCV 84.7 85.3 84.6  PLT 151 164 180   Cardiac Enzymes: No results found for this basename: CKTOTAL:5,CKMB:5,CKMBINDEX:5,TROPONINI:5 in the last 168 hours BNP (last 3 results) No results found for this basename: PROBNP:3 in the last 8760 hours CBG:  Lab 10/27/12 0731 10/26/12 2137 10/26/12 1801 10/26/12 1214 10/26/12 0719  GLUCAP 138* 131* 181* 103* 166*    Recent Results (from the past 240 hour(s))  URINE CULTURE     Status: Normal   Collection Time   10/24/12  8:09 PM      Component Value Range Status Comment   Specimen Description URINE, CATHETERIZED   Final    Special Requests NONE  Final    Culture  Setup Time 10/25/2012 02:31   Final    Colony Count NO GROWTH   Final    Culture NO GROWTH   Final    Report Status 10/26/2012 FINAL   Final      Studies: Dg Femur Left  10/25/2012  *RADIOLOGY REPORT*  Clinical Data: Left hip fracture  LEFT FEMUR - 2 VIEW  Comparison: 10/24/2012  Findings: Four view submitted for interpretation. The patient is status post intraoperative repair of the left femoral fracture.  A intramedullary fixation rod noted proximal left femur.  Two metallic pins  are noted in proximal left femur.  There is anatomic alignment.  IMPRESSION: The patient is status post intraoperative repair of the left femoral fracture.  A intramedullary fixation rod noted proximal left femur.  Two metallic pins are noted in proximal left femur. There is anatomic alignment.   Original Report Authenticated By: Lahoma Crocker, M.D.    Dg C-arm 1-60 Min-no Report  10/25/2012  CLINICAL DATA: left femoral hip fracture   C-ARM 1-60 MINUTES  Fluoroscopy was utilized by the requesting physician.  No radiographic  interpretation.      Scheduled Meds:    . aspirin  81 mg Oral Daily  . benazepril  20 mg Oral Daily   And  . hydrochlorothiazide  12.5 mg Oral Daily  . buPROPion  300 mg Oral q morning - 10a  . cholecalciferol  1,000 Units Oral Daily  . docusate sodium  100 mg Oral BID  . enoxaparin (LOVENOX) injection  40 mg Subcutaneous Q24H  . ezetimibe-simvastatin  1 tablet Oral QHS  . insulin aspart  0-15 Units Subcutaneous TID WC  . insulin aspart  0-5 Units Subcutaneous QHS  . metFORMIN  1,000 mg Oral BID WC   And  . linagliptin  5 mg Oral Q breakfast  . omega-3 acid ethyl esters  2 g Oral BID  . pantoprazole  20 mg Oral Daily  . potassium chloride  10 mEq Oral Daily   Continuous Infusions:    Principal Problem:  *Hip fracture, left Active Problems:  DIABETES MELLITUS, TYPE II  HYPERLIPIDEMIA  DEPRESSION  HYPERTENSION  OA (osteoarthritis)    Time spent: 25 mins    [facility] Pager [phone]. If 8PM-8AM, please contact night-coverage at [email or web address], password [facility] 10/27/2012, 10:15 AM  LOS: 3 days

## 2012-10-27 NOTE — Progress Notes (Signed)
OT Note:  Pt feels she will be fine at home with adls including clothing retrieval.  She had knee replacement earlier this year.  Will sign off from OT.  [place], [place] [phone] 10/27/2012

## 2012-10-27 NOTE — Discharge Summary (Signed)
Physician Discharge Summary  Brenda [name] GYI:[id number] DOB: [birth year] DOA: 10/24/2012  PCP: Brenda Norris, [name]  Admit date: 10/24/2012 Discharge date: 10/27/2012  Time spent: 35 minutes  Recommendations for Outpatient Follow-up:  1. Followup with Brenda [name] in 2 weeks. 2. Followup with Brenda Norris, [name] (PCP) in 1-2 weeks.  Discharge Diagnoses:  Principal Problem:  *Hip fracture, left Active Problems:  DIABETES MELLITUS, TYPE II  HYPERLIPIDEMIA  DEPRESSION  HYPERTENSION  OA (osteoarthritis)   Discharge Condition: Stable  Diet recommendation: Diabetic diet.  Filed Weights   10/25/12 0006  Weight: 88.905 kg (196 lb)    History of present illness:  Brenda [name] is an 61 y.o. female with hx of DM2, HTN, Hyperlipidemia, s/p knee Sx without problems, depression and anxiety, tripped and fell at her church without loss of consciousness, presents to ER with a left Fx hip on 10/24/2012.  Hospital Course:  Left hip fracture Per Brenda [name]. Appreciate his management.  Status post left intramedullary nail of femur on 10/25/2012.  PT evaluation recommending home health PT, which will be arranged after discharge.  Continue vitamin D.  Lovenox for 14 days.  Continue medications for pain control. 50% WB left leg.  Type 2 diabetes Sliding scale insulin.  Continue metformin and linagliptin (substituted for sitagliptan), resume home medications at discharge.  Hemoglobin A1c 7.2 suggesting an average blood sugar of 160.  Stable.  Hypertension Continue benazepril and hydrochlorothiazide.  Stable.  Depression/anxiety Continue Wellbutrin.  Continue home clonazepam dose as needed.  Stable.  Hyperlipidemia Continue ezetimibe/simvastatin.  Mild leukocytosis Likely reactive due from femur fracture.  Anemia Likely due to blood loss from hip fracture.  Consultants:  Brenda [name] ortho  Procedures:  Left intramedullary nail of femur on 10/25/2012  Antibiotics:  Cefazolin  postoperatively on 10/25/2012  Discharge Exam: Filed Vitals:   10/26/12 1600 10/26/12 2139 10/27/12 0515 10/27/12 0758  BP:  124/69 111/65   Pulse:  101 86   Temp:  99.1 F (37.3 C) 98.4 F (36.9 C)   TempSrc:  Oral Oral   Resp: 16 16 16 16   Height:      Weight:      SpO2: 95% 92% 95% 95%    Discharge Instructions  Discharge Orders    Future Appointments: Provider: Department: Dept Phone: Center:   11/08/2012 3:00 PM Brenda [name] [facility] [place] [phone] [facility]     Future Orders Please Complete By Expires   Diet Carb Modified      Call [name] / Call 911      Comments:   If you experience chest pain or shortness of breath, CALL 911 and be transported to the hospital emergency room.  If you develope a fever above 101 F, pus (white drainage) or increased drainage or redness at the wound, or calf pain, call your surgeon's office.   Discharge instructions      Comments:   Maintain surgical dressing for 10-14 days, then replace with gauze and tape. Keep the area dry and clean until follow up. Follow up in 2 weeks at [facility]. Call with any questions or concerns.   Constipation Prevention      Comments:   Drink plenty of fluids.  Prune juice may be helpful.  You may use a stool softener, such as Colace (over the counter) 100 mg twice a day.  Use MiraLax (over the counter) for constipation as needed.   Partial weight bearing      Scheduling Instructions:  50% weight bearing left leg       Medication List     As of 10/27/2012 10:20 AM    TAKE these medications         aspirin 81 MG tablet   Take 81 mg by mouth daily.      benazepril-hydrochlorthiazide 20-12.5 MG per tablet   Commonly known as: LOTENSIN HCT   Take 1 tablet by mouth every morning.      buPROPion 300 MG 24 hr tablet   Commonly known as: WELLBUTRIN XL   Take 300 mg by mouth every morning.      cholecalciferol 1000 UNITS tablet   Commonly known as: VITAMIN D    Take 1,000 Units by mouth daily.      clonazePAM 0.5 MG tablet   Commonly known as: KLONOPIN   Take 0.5 mg by mouth 3 (three) times daily as needed. anxiety      DSS 100 MG Caps   Take 100 mg by mouth 2 (two) times daily.      enoxaparin 40 MG/0.4ML injection   Commonly known as: LOVENOX   Inject 0.4 mLs (40 mg total) into the skin daily.      ezetimibe-simvastatin 10-40 MG per tablet   Commonly known as: VYTORIN   Take 1 tablet by mouth at bedtime.      ferrous sulfate 325 (65 FE) MG tablet   Take 1 tablet (325 mg total) by mouth 3 (three) times daily with meals.      furosemide 20 MG tablet   Commonly known as: LASIX   Take 20 mg by mouth 2 (two) times daily as needed. Fluid      HYDROcodone-acetaminophen 5-325 MG per tablet   Commonly known as: NORCO/VICODIN   Take 1-2 tablets by mouth every 4 (four) hours as needed for pain.      lansoprazole 15 MG capsule   Commonly known as: PREVACID   Take 15 mg by mouth daily.      methocarbamol 500 MG tablet   Commonly known as: ROBAXIN   Take 1 tablet (500 mg total) by mouth every 6 (six) hours as needed (muscle spasms).      omega-3 acid ethyl esters 1 G capsule   Commonly known as: LOVAZA   Take 2 g by mouth 2 (two) times daily.      polyethylene glycol packet   Commonly known as: MIRALAX / GLYCOLAX   Take 17 g by mouth daily as needed.      potassium chloride 10 MEQ tablet   Commonly known as: K-DUR   Take 10 mEq by mouth 2 (two) times daily as needed. Fluid      sitaGLIPtan-metformin 50-1000 MG per tablet   Commonly known as: JANUMET   Take 1 tablet by mouth 2 (two) times daily with a meal.      traMADol 50 MG tablet   Commonly known as: ULTRAM   Take 50 mg by mouth every 8 (eight) hours as needed. Pain        zolpidem 10 MG tablet   Commonly known as: AMBIEN   Take 10 mg by mouth at bedtime as needed. sleep           Follow-up Information    Follow up with Brenda [name]. Schedule an appointment as  soon as possible for a visit in 2 weeks.   Contact information:   [address] [place] [address] [phone]  Follow up with Brenda Norris, [name]. Schedule an appointment as soon as possible for a visit in 1 week.   Contact information:   [place] [place], [place] [address] [phone]           The results of significant diagnostics from this hospitalization (including imaging, microbiology, ancillary and laboratory) are listed below for reference.    Significant Diagnostic Studies: Dg Chest 1 View  10/24/2012  *RADIOLOGY REPORT*  Clinical Data: Left hip pain post fall, history hypertension, diabetes, endometrial cancer  CHEST - 1 VIEW  Comparison: 12/15/2011  Findings: Enlargement of cardiac silhouette. Calcified tortuous aorta. Pulmonary vascularity normal. Lungs clear. No pleural effusion or pneumothorax. Bones unremarkable.  IMPRESSION: Enlargement of cardiac silhouette. No acute abnormalities.   Original Report Authenticated By: Lavonia Dana, M.D.    Dg Hip Complete Left  10/24/2012  *RADIOLOGY REPORT*  Clinical Data: Left hip pain post fall  LEFT HIP - COMPLETE 2+ VIEW  Comparison: None  Findings: Osseous demineralization. Symmetric preserved hip and SI joints. Comminuted displaced intertrochanteric fracture left femur with mild varus angulation. No dislocation seen. Pelvis intact. Small pelvic phleboliths. Scattered atherosclerotic calcification.  IMPRESSION: Comminuted displaced and mildly angulated intertrochanteric fracture left femur. Osseous demineralization.   Original Report Authenticated By: Lavonia Dana, M.D.    Dg Femur Left  10/25/2012  *RADIOLOGY REPORT*  Clinical Data: Left hip fracture  LEFT FEMUR - 2 VIEW  Comparison: 10/24/2012  Findings: Four view submitted for interpretation. The patient is status post intraoperative repair of the left femoral fracture.  A intramedullary fixation rod noted proximal left femur.   Two metallic pins are noted in proximal left femur.  There is anatomic alignment.  IMPRESSION: The patient is status post intraoperative repair of the left femoral fracture.  A intramedullary fixation rod noted proximal left femur.  Two metallic pins are noted in proximal left femur. There is anatomic alignment.   Original Report Authenticated By: Lahoma Crocker, M.D.    Dg C-arm 1-60 Min-no Report  10/25/2012  CLINICAL DATA: left femoral hip fracture   C-ARM 1-60 MINUTES  Fluoroscopy was utilized by the requesting physician.  No radiographic  interpretation.      Microbiology: Recent Results (from the past 240 hour(s))  URINE CULTURE     Status: Normal   Collection Time   10/24/12  8:09 PM      Component Value Range Status Comment   Specimen Description URINE, CATHETERIZED   Final    Special Requests NONE   Final    Culture  Setup Time 10/25/2012 02:31   Final    Colony Count NO GROWTH   Final    Culture NO GROWTH   Final    Report Status 10/26/2012 FINAL   Final      Labs: Basic Metabolic Panel:  Lab 35/45/62 0422 10/26/12 0433 10/24/12 1950  NA 137 136 137  K 3.6 4.6 4.0  CL 103 102 105  CO2 25 25 24   GLUCOSE 159* 180* 168*  BUN 16 14 21   CREATININE 0.87 0.85 0.98  CALCIUM 8.7 8.6 9.3  MG -- -- --  PHOS -- -- --   Liver Function Tests: No results found for this basename: AST:5,ALT:5,ALKPHOS:5,BILITOT:5,PROT:5,ALBUMIN:5 in the last 168 hours No results found for this basename: LIPASE:5,AMYLASE:5 in the last 168 hours No results found for this basename: AMMONIA:5 in the last 168 hours CBC:  Lab 10/27/12 0422 10/26/12 0433 10/24/12 1950  WBC 8.9 10.8* 12.1*  NEUTROABS -- -- 7.8*  HGB 10.0* 10.3* 12.8  HCT 30.0* 31.4* 39.1  MCV 84.7 85.3 84.6  PLT 151 164 180   Cardiac Enzymes: No results found for this basename: CKTOTAL:5,CKMB:5,CKMBINDEX:5,TROPONINI:5 in the last 168 hours BNP: BNP (last 3 results) No results found for this basename: PROBNP:3 in the last 8760  hours CBG:  Lab 10/27/12 0731 10/26/12 2137 10/26/12 1801 10/26/12 1214 10/26/12 0719  GLUCAP 138* 131* 181* 103* 166*       Signed:  Verenise Moulin A  [facility] 10/27/2012, 10:20 AM

## 2012-10-27 NOTE — Progress Notes (Signed)
Physical Therapy Treatment Patient Details Name: Brenda [name] MRN: [id number] DOB: 06/13/1951 Today's Date: 10/27/2012 Time: 6384-6659 PT Time Calculation (min): 47 min  PT Assessment / Plan / Recommendation Comments on Treatment Session       Follow Up Recommendations  Home health PT     Does the patient have the potential to tolerate intense rehabilitation     Barriers to Discharge        Equipment Recommendations  Rolling walker with 5" wheels    Recommendations for Other Services OT consult  Frequency 7X/week   Plan Discharge plan remains appropriate    Precautions / Restrictions Precautions Precautions: Fall Restrictions Weight Bearing Restrictions: Yes LLE Weight Bearing: Partial weight bearing LLE Partial Weight Bearing Percentage or Pounds: 50   Pertinent Vitals/Pain     Mobility  Bed Mobility Bed Mobility: Supine to Sit Supine to Sit: 5: Supervision Details for Bed Mobility Assistance: cues for sequence and use of Leglifter Transfers Transfers: Sit to Stand;Stand to Sit Sit to Stand: 5: Supervision Stand to Sit: 5: Supervision Details for Transfer Assistance: cues for ue placement Ambulation/Gait Ambulation/Gait Assistance: 5: Supervision Ambulation Distance (Feet): 70 Feet (and 2 x 30') Assistive device: Rolling walker Ambulation/Gait Assistance Details: min cues for ER on L and position from RW Gait Pattern: Step-to pattern Stairs: Yes Stairs Assistance: 4: Min assist Stairs Assistance Details (indicate cue type and reason): cues for sequence and for foot/RW/crutch placement Stair Management Technique: One rail Right;No rails;Backwards;Forwards;With walker;With crutches Number of Stairs: 6  (4 stairs fwd with crutch/rail, 1 stair bkwd with RW twice)    Exercises     PT Diagnosis:    PT Problem List:   PT Treatment Interventions:     PT Goals Acute Rehab PT Goals PT Goal Formulation: With patient Time For Goal Achievement:  10/30/12 Potential to Achieve Goals: Good Pt will go Supine/Side to Sit: with modified independence PT Goal: Supine/Side to Sit - Progress: Progressing toward goal Pt will go Sit to Supine/Side: with modified independence PT Goal: Sit to Supine/Side - Progress: Progressing toward goal Pt will go Sit to Stand: with modified independence PT Goal: Sit to Stand - Progress: Progressing toward goal Pt will go Stand to Sit: with modified independence PT Goal: Stand to Sit - Progress: Progressing toward goal Pt will Ambulate: 51 - 150 feet;with modified independence;with rolling walker PT Goal: Ambulate - Progress: Progressing toward goal Pt will Go Up / Down Stairs: 3-5 stairs;with min assist;with least restrictive assistive device PT Goal: Up/Down Stairs - Progress: Met  Visit Information  Last PT Received On: 10/27/12 Assistance Needed: +1    Subjective Data  Subjective: That leg lifter made it much easier getting out of bed1 Patient Stated Goal: resume previous lifestyle workiing as Arboriculturist  Overall Cognitive Status: Appears within functional limits for tasks assessed/performed Arousal/Alertness: Awake/alert Orientation Level: Appears intact for tasks assessed Behavior During Session: [facility] for tasks performed    Balance     End of Session PT - End of Session Equipment Utilized During Treatment: Gait belt Activity Tolerance: Patient tolerated treatment well Patient left: in chair;with call bell/phone within reach Nurse Communication: Mobility status   GP     Aleiya Rye 10/27/2012, 12:19 PM

## 2012-10-27 NOTE — Progress Notes (Signed)
Pt for d/c home today with [facility] per [facility]. RW delivered at bedside for home use. 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 Dressing CDI to L hip area. IV d/c'd. Dressing provided for home use. D/C instructions & RX given with verbalized understanding. Awaiting for friend to come pick her up at 3pm.

## 2012-10-27 NOTE — Progress Notes (Signed)
Physical Therapy Treatment Patient Details Name: Brenda [name] MRN: [id number] DOB: 01/29/1951 Today's Date: 10/27/2012 Time: 2458-0998 PT Time Calculation (min): 18 min  PT Assessment / Plan / Recommendation Comments on Treatment Session  Reviewed car transfers with pt    Follow Up Recommendations  Home health PT     Does the patient have the potential to tolerate intense rehabilitation     Barriers to Discharge        Equipment Recommendations  Rolling walker with 5" wheels    Recommendations for Other Services OT consult  Frequency 7X/week   Plan Discharge plan remains appropriate    Precautions / Restrictions Precautions Precautions: Fall Restrictions Weight Bearing Restrictions: Yes LLE Weight Bearing: Partial weight bearing LLE Partial Weight Bearing Percentage or Pounds: 50   Pertinent Vitals/Pain     Mobility  Bed Mobility Bed Mobility: Sit to Supine Supine to Sit: 5: Supervision Sit to Supine: 5: Supervision Details for Bed Mobility Assistance: cues for sequence and use of Leglifter Transfers Transfers: Sit to Stand;Stand to Sit Sit to Stand: 5: Supervision Stand to Sit: 5: Supervision Details for Transfer Assistance: cues for ue placement Ambulation/Gait Ambulation/Gait Assistance: 6: Modified independent (Device/Increase time) Ambulation Distance (Feet): 23 Feet Assistive device: Rolling walker Ambulation/Gait Assistance Details: min cues for ER on L and position from RW Gait Pattern: Step-to pattern Stairs: Yes Stairs Assistance: 4: Min assist Stairs Assistance Details (indicate cue type and reason): cues for sequence and for foot/RW/crutch placement Stair Management Technique: One rail Right;No rails;Backwards;Forwards;With walker;With crutches Number of Stairs: 6  (4 stairs fwd with crutch/rail, 1 stair bkwd with RW twice)    Exercises     PT Diagnosis:    PT Problem List:   PT Treatment Interventions:     PT Goals Acute Rehab PT  Goals PT Goal Formulation: With patient Time For Goal Achievement: 10/30/12 Potential to Achieve Goals: Good Pt will go Supine/Side to Sit: with modified independence PT Goal: Supine/Side to Sit - Progress: Progressing toward goal Pt will go Sit to Supine/Side: with modified independence PT Goal: Sit to Supine/Side - Progress: Progressing toward goal Pt will go Sit to Stand: with modified independence PT Goal: Sit to Stand - Progress: Progressing toward goal Pt will go Stand to Sit: with modified independence PT Goal: Stand to Sit - Progress: Progressing toward goal Pt will Ambulate: 51 - 150 feet;with modified independence;with rolling walker PT Goal: Ambulate - Progress: Progressing toward goal Pt will Go Up / Down Stairs: 3-5 stairs;with min assist;with least restrictive assistive device PT Goal: Up/Down Stairs - Progress: Met  Visit Information  Last PT Received On: 10/27/12 Assistance Needed: +1    Subjective Data  Subjective: The Dr said I could go home this afternoon Patient Stated Goal: resume previous lifestyle workiing as Arboriculturist  Overall Cognitive Status: Appears within functional limits for tasks assessed/performed Arousal/Alertness: Awake/alert Orientation Level: Appears intact for tasks assessed Behavior During Session: [facility] for tasks performed    Balance     End of Session PT - End of Session Equipment Utilized During Treatment: Gait belt Activity Tolerance: Patient tolerated treatment well Patient left: in bed;with call bell/phone within reach Nurse Communication: Mobility status   GP     Strummer Canipe 10/27/2012, 12:23 PM

## 2012-10-30 ENCOUNTER — Other Ambulatory Visit: Payer: Self-pay | Admitting: Family Medicine

## 2012-11-03 ENCOUNTER — Encounter: Payer: Self-pay | Admitting: Family Medicine

## 2012-11-03 ENCOUNTER — Ambulatory Visit (INDEPENDENT_AMBULATORY_CARE_PROVIDER_SITE_OTHER): Payer: 59 | Admitting: Family Medicine

## 2012-11-03 VITALS — BP 110/72 | HR 104 | Temp 97.9°F

## 2012-11-03 DIAGNOSIS — F3289 Other specified depressive episodes: Secondary | ICD-10-CM

## 2012-11-03 DIAGNOSIS — D649 Anemia, unspecified: Secondary | ICD-10-CM

## 2012-11-03 DIAGNOSIS — F329 Major depressive disorder, single episode, unspecified: Secondary | ICD-10-CM

## 2012-11-03 DIAGNOSIS — S72009A Fracture of unspecified part of neck of unspecified femur, initial encounter for closed fracture: Secondary | ICD-10-CM

## 2012-11-03 DIAGNOSIS — Z23 Encounter for immunization: Secondary | ICD-10-CM

## 2012-11-03 DIAGNOSIS — E119 Type 2 diabetes mellitus without complications: Secondary | ICD-10-CM

## 2012-11-03 DIAGNOSIS — S72002A Fracture of unspecified part of neck of left femur, initial encounter for closed fracture: Secondary | ICD-10-CM

## 2012-11-03 DIAGNOSIS — I10 Essential (primary) hypertension: Secondary | ICD-10-CM

## 2012-11-03 LAB — CBC WITH DIFFERENTIAL/PLATELET
Basophils Relative: 0.5 % (ref 0.0–3.0)
Eosinophils Relative: 2 % (ref 0.0–5.0)
Lymphocytes Relative: 35.1 % (ref 12.0–46.0)
MCV: 86.2 fl (ref 78.0–100.0)
Monocytes Absolute: 0.8 10*3/uL (ref 0.1–1.0)
Monocytes Relative: 5.1 % (ref 3.0–12.0)
Neutrophils Relative %: 57.3 % (ref 43.0–77.0)
Platelets: 472 10*3/uL — ABNORMAL HIGH (ref 150.0–400.0)
RBC: 4.29 Mil/uL (ref 3.87–5.11)
WBC: 16.6 10*3/uL — ABNORMAL HIGH (ref 4.5–10.5)

## 2012-11-03 MED ORDER — ESCITALOPRAM OXALATE 20 MG PO TABS: 20.0000 mg | ORAL_TABLET | Freq: Every day | ORAL | Status: DC

## 2012-11-03 NOTE — Patient Instructions (Addendum)
Good to see you, Brenda [name]. I'm sorry you are having a difficult time. We are starting Lexapro 10 mg daily- ok to take with other medication.  Call me in a few weeks with an update.

## 2012-11-03 NOTE — Addendum Note (Signed)
Addended by: Ricki Miller on: 11/03/2012 01:57 PM   Modules accepted: Orders

## 2012-11-03 NOTE — Progress Notes (Signed)
HPI:  Very pleasant 62 yo female with h/o DM, HTN, OA, remote h/o TKA here for hospital follow up.  Notes reviewed. Admitted to [facility] 12/24- 10/27/2012 [place] fall [place] ([place] LOC),which led to a left hip fracture.  She was lighting candles at church and stepped back and fell down the stair.  Status post left intramedullary nail of femur on 10/25/2012. PT evaluation recommended home health PT which has not yet been out to see her.  She did speak with them today.  Has follow up with Dr. Alvan Dame next Friday.  She will be taking Lovenox for total of14 days.   She did have some mild anemia and leukocytosis that were felt to be secondary to hip fx.  Lab Results  Component Value Date   WBC 8.9 10/27/2012   HGB 10.0* 10/27/2012   HCT 30.0* 10/27/2012   MCV 84.7 10/27/2012   PLT 151 10/27/2012   Pain is well controlled but she is having a difficult time coping with this.  She has chronic depression and anxiety but it has been worsened by this incident.  Feels she was doing quite well on Wellbutril 300 mg daily and Klonipin prn prior to this incident.  Now feels helpless- does not like to rely on others.  She is tearful.  Sleeping ok.  Denies any SI.  Patient Active Problem List  Diagnosis  . DIABETES MELLITUS, TYPE II  . HYPERLIPIDEMIA  . DEPRESSION  . HYPERTENSION  . GERD  . Abdominal  pain, other specified site  . OA (osteoarthritis)  . Pre-operative clearance  . Murmur, cardiac  . Postop Hyponatremia  . Hip fracture, left   Past Medical History  Diagnosis Date  . Depression   . Diabetes mellitus   . GERD (gastroesophageal reflux disease)   . Hyperlipidemia   . Hypertension   . Heart murmur   . Endometrial ca     1998  . Heel spur   . PONV (postoperative nausea and vomiting)     after hysterectomy   Past Surgical History  Procedure Date  . Abdominal hysterectomy 1998  . Left knee arthroscopy 12/2010  . Total knee arthroplasty 12/20/2011    Procedure: TOTAL KNEE  ARTHROPLASTY;  Surgeon: Gearlean Alf, MD;  Location: [facility];  Service: Orthopedics;  Laterality: Left;  Failed attempt at spinal   . Femur im nail 10/25/2012    Procedure: INTRAMEDULLARY (IM) NAIL FEMORAL;  Surgeon: Mauri Pole, MD;  Location: [facility];  Service: Orthopedics;  Laterality: Left;   History  Substance Use Topics  . Smoking status: Never Smoker   . Smokeless tobacco: Never Used  . Alcohol Use: No   Family History  Problem Relation Age of Onset  . Cancer Mother     breast cancer  . Cancer Father     plasma sarcoma   Allergies  Allergen Reactions  . Beta Adrenergic Blockers     REACTION: Decreased heart rate   Current Outpatient Prescriptions on File Prior to Visit  Medication Sig Dispense Refill  . aspirin 81 MG tablet Take 81 mg by mouth daily.      . benazepril-hydrochlorthiazide (LOTENSIN HCT) 20-12.5 MG per tablet Take 1 tablet by mouth every morning.      Marland Kitchen buPROPion (WELLBUTRIN XL) 300 MG 24 hr tablet Take 300 mg by mouth every morning.      Marland Kitchen buPROPion (WELLBUTRIN XL) 300 MG 24 hr tablet TAKE 1 TABLET BY MOUTH ONCE DAILY  90 tablet  1  .  cholecalciferol (VITAMIN D) 1000 UNITS tablet Take 1,000 Units by mouth daily.      . clonazePAM (KLONOPIN) 0.5 MG tablet Take 0.5 mg by mouth 3 (three) times daily as needed. anxiety      . docusate sodium 100 MG CAPS Take 100 mg by mouth 2 (two) times daily.  10 capsule    . enoxaparin (LOVENOX) 40 MG/0.4ML injection Inject 0.4 mLs (40 mg total) into the skin daily.  12 Syringe  0  . ezetimibe-simvastatin (VYTORIN) 10-40 MG per tablet Take 1 tablet by mouth at bedtime.      . ferrous sulfate (FERROUSUL) 325 (65 FE) MG tablet Take 1 tablet (325 mg total) by mouth 3 (three) times daily with meals.    3  . furosemide (LASIX) 20 MG tablet Take 20 mg by mouth 2 (two) times daily as needed. Fluid      . HYDROcodone-acetaminophen (NORCO/VICODIN) 5-325 MG per tablet Take 1-2 tablets by mouth every 4 (four) hours as needed for pain.   120 tablet  0  . lansoprazole (PREVACID) 15 MG capsule Take 15 mg by mouth daily.       . methocarbamol (ROBAXIN) 500 MG tablet Take 1 tablet (500 mg total) by mouth every 6 (six) hours as needed (muscle spasms).  50 tablet  0  . omega-3 acid ethyl esters (LOVAZA) 1 G capsule Take 2 g by mouth 2 (two) times daily.      . polyethylene glycol (MIRALAX / GLYCOLAX) packet Take 17 g by mouth daily as needed.  14 each    . potassium chloride (K-DUR) 10 MEQ tablet Take 10 mEq by mouth 2 (two) times daily as needed. Fluid      . sitaGLIPtan-metformin (JANUMET) 50-1000 MG per tablet Take 1 tablet by mouth 2 (two) times daily with a meal.      . traMADol (ULTRAM) 50 MG tablet Take 50 mg by mouth every 8 (eight) hours as needed. Pain       . zolpidem (AMBIEN) 10 MG tablet Take 10 mg by mouth at bedtime as needed. sleep       The PMH, PSH, Social History, Family History, Medications, and allergies have been reviewed in [facility], and have been updated if relevant.  ROS:  See HPI  Physical exam: BP 110/72  Pulse 104  Temp 97.9 F (36.6 C) Gen: alert, tearful, NAD MSK:  Posterior hip dressing c/d/i Psych:  Good eye contact but very tearful and anxious throughout our encounter today. Extremities- no edema  Assessment and Plan: 1. Hip fracture, left  Status post left intramedullary nail of femur. On lovenox.  Has not yet changed dressing- advised to wait until next Friday by ortho. Pneumovax given today.     2. DEPRESSION  Deteriorated.  Spent majority of office visit discussing this. She declines psychotherapy.  Will add lexapro 10 mg daily to her current Wellbutrin and Klonipin. Call me in a few weeks with an update.  The patient indicates understanding of these issues and agrees with the plan.    3. HYPERTENSION  Stable on current meds.   4. Anemia  Likely post operative.  Recheck CBC today. CBC with Differential

## 2012-11-08 ENCOUNTER — Ambulatory Visit: Payer: 59 | Admitting: Cardiovascular Disease

## 2012-11-09 ENCOUNTER — Other Ambulatory Visit (INDEPENDENT_AMBULATORY_CARE_PROVIDER_SITE_OTHER): Payer: 59

## 2012-11-09 DIAGNOSIS — D72829 Elevated white blood cell count, unspecified: Secondary | ICD-10-CM

## 2012-11-09 LAB — CBC WITH DIFFERENTIAL/PLATELET
Basophils Relative: 0.5 % (ref 0.0–3.0)
Eosinophils Relative: 1.1 % (ref 0.0–5.0)
Hemoglobin: 11.2 g/dL — ABNORMAL LOW (ref 12.0–15.0)
MCHC: 32.8 g/dL (ref 30.0–36.0)
MCV: 85 fl (ref 78.0–100.0)
Monocytes Absolute: 0.5 10*3/uL (ref 0.1–1.0)
Neutro Abs: 7.6 10*3/uL (ref 1.4–7.7)
Neutrophils Relative %: 65.4 % (ref 43.0–77.0)
RBC: 4.03 Mil/uL (ref 3.87–5.11)
WBC: 11.6 10*3/uL — ABNORMAL HIGH (ref 4.5–10.5)

## 2012-11-15 ENCOUNTER — Other Ambulatory Visit: Payer: Self-pay | Admitting: Family Medicine

## 2012-11-15 NOTE — Telephone Encounter (Signed)
Medicines called [facility].

## 2012-11-29 ENCOUNTER — Ambulatory Visit: Payer: 59 | Admitting: Cardiovascular Disease

## 2013-01-16 ENCOUNTER — Other Ambulatory Visit: Payer: Self-pay | Admitting: Family Medicine

## 2013-01-31 ENCOUNTER — Ambulatory Visit (INDEPENDENT_AMBULATORY_CARE_PROVIDER_SITE_OTHER): Payer: 59 | Admitting: Cardiovascular Disease

## 2013-01-31 ENCOUNTER — Encounter: Payer: Self-pay | Admitting: Cardiovascular Disease

## 2013-01-31 VITALS — BP 142/88 | HR 98 | Ht 61.0 in | Wt 189.0 lb

## 2013-01-31 DIAGNOSIS — K219 Gastro-esophageal reflux disease without esophagitis: Secondary | ICD-10-CM

## 2013-01-31 DIAGNOSIS — E785 Hyperlipidemia, unspecified: Secondary | ICD-10-CM

## 2013-01-31 DIAGNOSIS — E119 Type 2 diabetes mellitus without complications: Secondary | ICD-10-CM

## 2013-01-31 DIAGNOSIS — R000 Tachycardia, unspecified: Secondary | ICD-10-CM

## 2013-01-31 DIAGNOSIS — I10 Essential (primary) hypertension: Secondary | ICD-10-CM

## 2013-01-31 DIAGNOSIS — R011 Cardiac murmur, unspecified: Secondary | ICD-10-CM

## 2013-01-31 NOTE — Assessment & Plan Note (Signed)
Heart rate is elevated. Etiology uncertain. Will start low-dose beta blocker for symptom relief

## 2013-01-31 NOTE — Assessment & Plan Note (Signed)
Blood pressure is well controlled. She is having tachycardia and would like medications to slow her rate. We have suggested she start bystolic milligrams daily, slowly titrating up to 10 mg daily. If blood pressure runs low, she could cut her benazepril HCTZ in half.

## 2013-01-31 NOTE — Progress Notes (Signed)
Patient ID: Brenda [name], female    DOB: 1951/05/18, 62 y.o.   MRN: [id number]  HPI Comments: Brenda [name] is a very pleasant 62 -year-old woman with history of obesity, diabetes, recent knee pain with previous stress test and echocardiogram several years ago for abnormal EKG, who presents for routine followup.  She had total knee replacement on the left in February 2013, suffered a fall and hip fracture December 13 requiring repair with long we have, nonweightbearing for 2 months.  She states that she is having palpitations, chronic tachycardia/fast rhythm. Denies any chest pain. Heart rate at baseline typically in the mid to high 90s a regular basis. She has been less active because of her leg. She is back at work working at [employer].   EKG shows normal sinus rhythm with rate 98 beats per minute with no significant ST or T wave changes, poor R-wave progression through the anterior precordial leads, left axis deviation   Outpatient Encounter Prescriptions as of 01/31/2013  Medication Sig Dispense Refill  . aspirin 81 MG tablet Take 81 mg by mouth daily.      . benazepril-hydrochlorthiazide (LOTENSIN HCT) 20-12.5 MG per tablet Take 1 tablet by mouth every morning.      Marland Kitchen buPROPion (WELLBUTRIN XL) 300 MG 24 hr tablet TAKE 1 TABLET BY MOUTH ONCE DAILY  90 tablet  1  . cholecalciferol (VITAMIN D) 1000 UNITS tablet Take 1,000 Units by mouth daily.      . clonazePAM (KLONOPIN) 0.5 MG tablet TAKE 2 TABLETS BY MOUTH 3 TIMES A DAY AS NEEDED FOR ANXIETY  90 tablet  1  . docusate sodium 100 MG CAPS Take 100 mg by mouth 2 (two) times daily.  10 capsule    . ezetimibe-simvastatin (VYTORIN) 10-40 MG per tablet Take 1 tablet by mouth at bedtime.      . furosemide (LASIX) 20 MG tablet Take 20 mg by mouth 2 (two) times daily as needed. Fluid      . lansoprazole (PREVACID) 15 MG capsule Take 15 mg by mouth daily.       Marland Kitchen omega-3 acid ethyl esters (LOVAZA) 1 G capsule Take 2 g by mouth 2 (two)  times daily.      . potassium chloride (K-DUR) 10 MEQ tablet Take 10 mEq by mouth 2 (two) times daily as needed. Fluid      . sitaGLIPtan-metformin (JANUMET) 50-1000 MG per tablet Take 1 tablet by mouth 2 (two) times daily with a meal.      . traMADol (ULTRAM) 50 MG tablet TAKE 2 TABLETS BY MOUTH EVERY 8 HOURS AS NEEDED FOR PAIN **MAX 8 TABLETS PER DAY**  120 tablet  PRN  . zolpidem (AMBIEN) 10 MG tablet Take 10 mg by mouth at bedtime as needed. sleep        Review of Systems  Constitutional: Negative.   HENT: Negative.   Eyes: Negative.   Respiratory: Negative.   Cardiovascular: Positive for palpitations.  Gastrointestinal: Negative.   Musculoskeletal: Positive for joint swelling, arthralgias and gait problem.  Skin: Negative.   Neurological: Negative.   Psychiatric/Behavioral: Negative.     BP 142/88  Pulse 98  Ht 5' 1"  (1.549 m)  Wt 189 lb (85.73 kg)  BMI 35.73 kg/m2  Physical Exam  Nursing note and vitals reviewed. Constitutional: She is oriented to person, place, and time. She appears well-developed and well-nourished.  HENT:  Head: Normocephalic.  Nose: Nose normal.  Mouth/Throat: Oropharynx is clear and moist.  Eyes: Conjunctivae are normal. Pupils are equal, round, and reactive to light.  Neck: Normal range of motion. Neck supple. No JVD present.  Cardiovascular: Regular rhythm, S1 normal, S2 normal, normal heart sounds and intact distal pulses.  Tachycardia present.  Exam reveals no gallop and no friction rub.   No murmur heard. Pulmonary/Chest: Effort normal and breath sounds normal. No respiratory distress. She has no wheezes. She has no rales. She exhibits no tenderness.  Abdominal: Soft. Bowel sounds are normal. She exhibits no distension. There is no tenderness.  Musculoskeletal: Normal range of motion. She exhibits no edema and no tenderness.  Lymphadenopathy:    She has no cervical adenopathy.  Neurological: She is alert and oriented to person, place, and  time. Coordination normal.  Skin: Skin is warm and dry. No rash noted. No erythema.  Psychiatric: She has a normal mood and affect. Her behavior is normal. Judgment and thought content normal.    Assessment and Plan

## 2013-01-31 NOTE — Assessment & Plan Note (Signed)
We have encouraged continued exercise, careful diet management in an effort to lose weight. 

## 2013-01-31 NOTE — Assessment & Plan Note (Signed)
We have recommended that she stay on her statin. She has followup with primary care for routine lab work

## 2013-01-31 NOTE — Patient Instructions (Addendum)
  Consider starting bystolic 5 mg daily Ok to take twice a day for fast heart rate if needed  If blood pressure drops, cut the benazepril hct in 1/2  Please call us if you have new issues that need to be addressed before your next appt.  Your physician wants you to follow-up in: 6 months.  You will receive a reminder letter in the mail two months in advance. If you don't receive a letter, please call our office to schedule the follow-up appointment.

## 2013-02-05 ENCOUNTER — Other Ambulatory Visit: Payer: Self-pay | Admitting: Family Medicine

## 2013-02-05 ENCOUNTER — Other Ambulatory Visit: Payer: Self-pay | Admitting: *Deleted

## 2013-02-05 MED ORDER — NEBIVOLOL HCL 10 MG PO TABS: 10.0000 mg | ORAL_TABLET | Freq: Every day | ORAL | Status: DC

## 2013-02-05 NOTE — Telephone Encounter (Signed)
Last filled 11/18/12.

## 2013-02-06 NOTE — Telephone Encounter (Signed)
RX printed in error.  Phoned in to [facility].

## 2013-04-03 ENCOUNTER — Encounter: Payer: Self-pay | Admitting: Radiology

## 2013-04-04 ENCOUNTER — Ambulatory Visit (INDEPENDENT_AMBULATORY_CARE_PROVIDER_SITE_OTHER): Payer: 59 | Admitting: Family Medicine

## 2013-04-04 ENCOUNTER — Encounter: Payer: Self-pay | Admitting: Family Medicine

## 2013-04-04 VITALS — BP 132/84 | HR 63 | Temp 98.1°F | Wt 176.5 lb

## 2013-04-04 DIAGNOSIS — Z780 Asymptomatic menopausal state: Secondary | ICD-10-CM

## 2013-04-04 DIAGNOSIS — G4762 Sleep related leg cramps: Secondary | ICD-10-CM

## 2013-04-04 DIAGNOSIS — R21 Rash and other nonspecific skin eruption: Secondary | ICD-10-CM | POA: Insufficient documentation

## 2013-04-04 LAB — BASIC METABOLIC PANEL
BUN: 28 mg/dL — ABNORMAL HIGH (ref 6–23)
Chloride: 107 mEq/L (ref 96–112)
Creatinine, Ser: 1.1 mg/dL (ref 0.4–1.2)
GFR: 52.01 mL/min — ABNORMAL LOW (ref >60.00)
Glucose, Bld: 85 mg/dL (ref 70–99)
Potassium: 4.9 mEq/L (ref 3.5–5.1)

## 2013-04-04 MED ORDER — FLUOCINONIDE-E 0.05 % EX CREA: TOPICAL_CREAM | Freq: Two times a day (BID) | CUTANEOUS | Status: DC

## 2013-04-04 NOTE — Patient Instructions (Addendum)
Please stop by to see Brenda [name] on your way out to set up your referral for bone density and dermatology.  I think you have a type of eczema- try lidex to areas no more than 10 days at time.  We will call you with your lab results.

## 2013-04-04 NOTE — Progress Notes (Signed)
Subjective:    Patient ID: Brenda [name], female    DOB: 1951/04/05, 62 y.o.   MRN: [id number]  HPI  62 yo pleasant female here to discuss several issues.  1.  ? Weak bones- fell and fractured her hip in January.  Ortho PA told her she has "weak bones" and she is very concerned about this.  Last DEXA in 11/2009 showed normal bone density. She has had no other fractures. Takes Caltrate and has recently increased her walking.  2.  Nocturnal leg cramps- now occuring almost every night for past few weeks.  Did start taking OTC nexium a few weeks ago. No LE weakness.  3.  Rash- for over a year, gets flaky, red raised areas- can occur anywhere on her body.  They come and go. Not itchy.  Not painful.  Usually resolve after a couple of weeks and do not leave any scars.  Patient Active Problem List   Diagnosis Date Noted  . Rash and nonspecific skin eruption 04/04/2013  . Tachycardia 01/31/2013  . Hip fracture, left 10/24/2012  . Postop Hyponatremia 12/21/2011  . OA (osteoarthritis) 09/20/2011  . Pre-operative clearance 09/20/2011  . Murmur, cardiac 09/20/2011  . Abdominal  pain, other specified site 04/29/2011  . DIABETES MELLITUS, TYPE II 11/06/2010  . HYPERLIPIDEMIA 11/06/2010  . DEPRESSION 11/06/2010  . HYPERTENSION 11/06/2010  . GERD 11/06/2010   Past Medical History  Diagnosis Date  . Depression   . Diabetes mellitus   . GERD (gastroesophageal reflux disease)   . Hyperlipidemia   . Hypertension   . Heart murmur   . Endometrial ca     1998  . Heel spur   . PONV (postoperative nausea and vomiting)     after hysterectomy   Past Surgical History  Procedure Laterality Date  . Abdominal hysterectomy  1998  . Left knee arthroscopy  12/2010  . Total knee arthroplasty  12/20/2011    Procedure: TOTAL KNEE ARTHROPLASTY;  Surgeon: Gearlean Alf, MD;  Location: [facility];  Service: Orthopedics;  Laterality: Left;  Failed attempt at spinal   . Femur im nail  10/25/2012   Procedure: INTRAMEDULLARY (IM) NAIL FEMORAL;  Surgeon: Mauri Pole, MD;  Location: [facility];  Service: Orthopedics;  Laterality: Left;  . Hip surgery     History  Substance Use Topics  . Smoking status: Never Smoker   . Smokeless tobacco: Never Used  . Alcohol Use: No   Family History  Problem Relation Age of Onset  . Cancer Mother     breast cancer  . Cancer Father     plasma sarcoma   Allergies  Allergen Reactions  . Beta Adrenergic Blockers     REACTION: Decreased heart rate   Current Outpatient Prescriptions on File Prior to Visit  Medication Sig Dispense Refill  . aspirin 81 MG tablet Take 81 mg by mouth daily.      . benazepril-hydrochlorthiazide (LOTENSIN HCT) 20-12.5 MG per tablet Take 1 tablet by mouth every morning.      Marland Kitchen buPROPion (WELLBUTRIN XL) 300 MG 24 hr tablet TAKE 1 TABLET BY MOUTH ONCE DAILY  90 tablet  1  . cholecalciferol (VITAMIN D) 1000 UNITS tablet Take 2,000 Units by mouth daily.       . clonazePAM (KLONOPIN) 0.5 MG tablet TAKE 2 TABLETS BY MOUTH 3 TIMES A DAY AS NEEDED FOR ANXIETY  90 tablet  1  . ezetimibe-simvastatin (VYTORIN) 10-40 MG per tablet Take 1 tablet by mouth at bedtime.      Marland Kitchen  nebivolol (BYSTOLIC) 10 MG tablet Take 1 tablet (10 mg total) by mouth daily.  30 tablet  6  . omega-3 acid ethyl esters (LOVAZA) 1 G capsule Take 2 g by mouth 2 (two) times daily.      . sitaGLIPtan-metformin (JANUMET) 50-1000 MG per tablet Take 1 tablet by mouth 2 (two) times daily with a meal.      . traMADol (ULTRAM) 50 MG tablet TAKE 2 TABLETS BY MOUTH EVERY 8 HOURS AS NEEDED FOR PAIN **MAX 8 TABLETS PER DAY**  120 tablet  PRN  . zolpidem (AMBIEN) 10 MG tablet Take 10 mg by mouth at bedtime as needed. sleep      . docusate sodium 100 MG CAPS Take 100 mg by mouth 2 (two) times daily.  10 capsule    . furosemide (LASIX) 20 MG tablet Take 20 mg by mouth 2 (two) times daily as needed. Fluid      . lansoprazole (PREVACID) 15 MG capsule Take 15 mg by mouth daily.         . potassium chloride (K-DUR) 10 MEQ tablet Take 10 mEq by mouth 2 (two) times daily as needed. Fluid       No current facility-administered medications on file prior to visit.   The PMH, PSH, Social History, Family History, Medications, and allergies have been reviewed in [facility], and have been updated if relevant.    Review of Systems See HPI No CP or SOB No n/v/d No HA    Objective:   Physical Exam  Constitutional: She appears well-developed and well-nourished. No distress.  HENT:  Head: Normocephalic.  Eyes: Pupils are equal, round, and reactive to light.  Neck: Normal range of motion.  Cardiovascular: Normal rate, regular rhythm and normal heart sounds.   Pulmonary/Chest: Effort normal and breath sounds normal.  Abdominal: Soft.  Skin: Skin is warm, dry and intact. Rash noted.     Psychiatric: She has a normal mood and affect. Her speech is normal and behavior is normal. Judgment and thought content normal. Cognition and memory are normal.   BP 132/84  Pulse 63  Temp(Src) 98.1 F (36.7 C) (Oral)  Wt 176 lb 8 oz (80.06 kg)  BMI 33.37 kg/m2  SpO2 96%        Assessment & Plan:  1. Post-menopausal Will recheck bone density.  Reassurance provided to pt as last bone density was normal. Advised to continue increasing weight bearing exercise. The patient indicates understanding of these issues and agrees with the plan.  - DG Bone Density; Future  2. Rash and nonspecific skin eruption ?eczematic rash.  Will refer to derm for further evaluation given duration of symptoms. - Ambulatory referral to Dermatology  3. Nocturnal leg cramps New- ?electrolyte abnormality spurred by PPI.  Will check labs today. The patient indicates understanding of these issues and agrees with the plan.  - Basic Metabolic Panel - Magnesium

## 2013-04-20 ENCOUNTER — Ambulatory Visit
Admission: RE | Admit: 2013-04-20 | Discharge: 2013-04-20 | Disposition: A | Discharge status: Home / Self Care | Payer: 59 | Source: Ambulatory Visit | Attending: Family Medicine | Admitting: Family Medicine

## 2013-04-20 ENCOUNTER — Other Ambulatory Visit: Payer: Self-pay

## 2013-04-20 DIAGNOSIS — Z780 Asymptomatic menopausal state: Secondary | ICD-10-CM

## 2013-04-20 DIAGNOSIS — Z1231 Encounter for screening mammogram for malignant neoplasm of breast: Secondary | ICD-10-CM

## 2013-04-23 ENCOUNTER — Other Ambulatory Visit: Payer: Self-pay | Admitting: Family Medicine

## 2013-04-24 NOTE — Telephone Encounter (Signed)
Ambien [facility].

## 2013-04-26 ENCOUNTER — Encounter: Payer: Self-pay | Admitting: Radiology

## 2013-04-27 ENCOUNTER — Other Ambulatory Visit (INDEPENDENT_AMBULATORY_CARE_PROVIDER_SITE_OTHER): Payer: 59

## 2013-05-07 ENCOUNTER — Ambulatory Visit
Admission: RE | Admit: 2013-05-07 | Discharge: 2013-05-07 | Disposition: A | Discharge status: Home / Self Care | Payer: 59 | Source: Ambulatory Visit

## 2013-05-07 DIAGNOSIS — Z1231 Encounter for screening mammogram for malignant neoplasm of breast: Secondary | ICD-10-CM

## 2013-05-08 ENCOUNTER — Other Ambulatory Visit: Payer: Self-pay | Admitting: Family Medicine

## 2013-05-11 ENCOUNTER — Encounter: Payer: Self-pay | Admitting: Family Medicine

## 2013-05-15 ENCOUNTER — Other Ambulatory Visit: Payer: Self-pay | Admitting: Family Medicine

## 2013-05-15 NOTE — Telephone Encounter (Signed)
rx called to pharmacy 

## 2013-08-09 ENCOUNTER — Other Ambulatory Visit: Payer: Self-pay | Admitting: Family Medicine

## 2013-08-09 NOTE — Telephone Encounter (Signed)
rx called into pharmacy

## 2013-08-09 NOTE — Telephone Encounter (Signed)
Last office visit 04/04/2013.  Ok to refill?

## 2013-08-31 ENCOUNTER — Other Ambulatory Visit: Payer: Self-pay | Admitting: Family Medicine

## 2013-08-31 NOTE — Telephone Encounter (Signed)
Last office visit 04/04/2013.  Ok to refill?

## 2013-08-31 NOTE — Telephone Encounter (Signed)
Ok to refill-phone in please

## 2013-08-31 NOTE — Telephone Encounter (Signed)
Called to [facility].

## 2013-09-06 ENCOUNTER — Other Ambulatory Visit: Payer: Self-pay

## 2013-09-06 ENCOUNTER — Other Ambulatory Visit: Payer: Self-pay | Admitting: Family Medicine

## 2013-09-06 NOTE — Telephone Encounter (Signed)
Last office visit 04/04/2013.  Ok to refill?

## 2013-10-19 ENCOUNTER — Other Ambulatory Visit: Payer: Self-pay | Admitting: Family Medicine

## 2013-10-19 NOTE — Telephone Encounter (Signed)
Pt requesting medication refill. Last OV 6/4 with no future appt sched. pls advise

## 2013-10-19 NOTE — Telephone Encounter (Signed)
Please phone in tramadol

## 2013-10-22 NOTE — Telephone Encounter (Signed)
Spoke to steve at outpatient pharmacy; Rx called in as requested

## 2013-11-13 ENCOUNTER — Other Ambulatory Visit: Payer: Self-pay | Admitting: Family Medicine

## 2013-11-13 NOTE — Telephone Encounter (Signed)
Pt requesting medication refill. Last ov 04/2013 with no future appts scheduled pls advise

## 2013-11-13 NOTE — Telephone Encounter (Signed)
Lm on pts vm indicating Rx has been sent to requested pharmacy.

## 2013-12-10 ENCOUNTER — Other Ambulatory Visit: Payer: Self-pay | Admitting: Internal Medicine

## 2013-12-10 ENCOUNTER — Other Ambulatory Visit: Payer: Self-pay | Admitting: Cardiovascular Disease

## 2013-12-10 ENCOUNTER — Other Ambulatory Visit: Payer: Self-pay | Admitting: Family Medicine

## 2013-12-10 NOTE — Telephone Encounter (Signed)
Pt requesting medication refill. Last ov 04/2014 with no future appts scheduled. pls advise

## 2013-12-10 NOTE — Telephone Encounter (Signed)
Last prescribed 09/14/13 #90 and last office visit was 6\2014--please advise

## 2013-12-18 ENCOUNTER — Ambulatory Visit: Payer: 59 | Admitting: Cardiovascular Disease

## 2013-12-25 ENCOUNTER — Other Ambulatory Visit: Payer: Self-pay | Admitting: Internal Medicine

## 2013-12-25 ENCOUNTER — Other Ambulatory Visit: Payer: Self-pay | Admitting: Family Medicine

## 2013-12-25 NOTE — Telephone Encounter (Signed)
Last office visit 04/04/2013.  Last refilled 10/19/2013.  Ok to refill?

## 2013-12-25 NOTE — Telephone Encounter (Signed)
Called to [facility].

## 2013-12-25 NOTE — Telephone Encounter (Signed)
Last prescribed 10/19/13--please advise

## 2013-12-26 NOTE — Telephone Encounter (Signed)
Lm on pts vm informing her Rx has been called into requested pharmacy

## 2013-12-26 NOTE — Telephone Encounter (Signed)
Dr. Deborra Medina patient

## 2013-12-28 ENCOUNTER — Encounter: Payer: Self-pay | Admitting: Cardiovascular Disease

## 2013-12-28 ENCOUNTER — Ambulatory Visit (INDEPENDENT_AMBULATORY_CARE_PROVIDER_SITE_OTHER): Payer: 59 | Admitting: Cardiovascular Disease

## 2013-12-28 VITALS — BP 140/82 | HR 71 | Ht 61.5 in | Wt 210.8 lb

## 2013-12-28 DIAGNOSIS — R011 Cardiac murmur, unspecified: Secondary | ICD-10-CM

## 2013-12-28 DIAGNOSIS — Z79899 Other long term (current) drug therapy: Secondary | ICD-10-CM

## 2013-12-28 DIAGNOSIS — R000 Tachycardia, unspecified: Secondary | ICD-10-CM

## 2013-12-28 DIAGNOSIS — I10 Essential (primary) hypertension: Secondary | ICD-10-CM | POA: Diagnosis not present

## 2013-12-28 DIAGNOSIS — E119 Type 2 diabetes mellitus without complications: Secondary | ICD-10-CM

## 2013-12-28 DIAGNOSIS — E785 Hyperlipidemia, unspecified: Secondary | ICD-10-CM

## 2013-12-28 MED ORDER — METOPROLOL SUCCINATE ER 25 MG PO TB24: 25.0000 mg | ORAL_TABLET | Freq: Every day | ORAL | Status: DC

## 2013-12-28 MED ORDER — SIMVASTATIN 40 MG PO TABS: 40.0000 mg | ORAL_TABLET | Freq: Every day | ORAL | Status: DC

## 2013-12-28 NOTE — Assessment & Plan Note (Signed)
She is unable to afford Vytorin. We will change her to simvastatin 40 mg daily. Recommended weight loss, exercise

## 2013-12-28 NOTE — Assessment & Plan Note (Signed)
We have recommended that she hold the bystolic and start metoprolol succinate 25 mg daily

## 2013-12-28 NOTE — Assessment & Plan Note (Signed)
We have encouraged continued exercise, careful diet management in an effort to lose weight. 

## 2013-12-28 NOTE — Progress Notes (Signed)
Patient ID: Brenda [name], female    DOB: [birth year], 63 y.o.   MRN: [id number]  HPI Comments: Brenda [name] is a very pleasant 63 -year-old woman with history of obesity, diabetes, recent knee pain with previous stress test and echocardiogram several years ago for abnormal EKG, who presents for routine followup.   She had total knee replacement on the left in February 2013, suffered a fall and hip fracture December 13 requiring repair with long we have, nonweightbearing for 2 months. On her last clinic visit she was having palpitations. In followup today these have improved on low-dose beta blocker She would like to change several of her medications as she is going to retire next year. She would like to change Vytorin and bystolic as they are very expensive for her. She's not been doing any regular exercise  She is working  at [employer].   EKG shows normal sinus rhythm with rate 71 beats per minute with no significant ST or T wave changes   Outpatient Encounter Prescriptions as of 12/28/2013  Medication Sig  . aspirin 81 MG tablet Take 81 mg by mouth daily.  . benazepril-hydrochlorthiazide (LOTENSIN HCT) 20-12.5 MG per tablet TAKE 1 TABLET BY MOUTH DAILY.  Marland Kitchen buPROPion (WELLBUTRIN XL) 300 MG 24 hr tablet TAKE 1 TABLET BY MOUTH ONCE DAILY  . BYSTOLIC 10 MG tablet TAKE 1 TABLET BY MOUTH DAILY.  . Calcium Carbonate-Vit D-Min (CALCIUM 1200 PO) Take 1 capsule by mouth daily.  . cholecalciferol (VITAMIN D) 1000 UNITS tablet Take 2,000 Units by mouth daily.   . clonazePAM (KLONOPIN) 0.5 MG tablet TAKE 2 TABLETS BY MOUTH THREE TIMES A DAY AS NEEDED FOR ANXIETY  . Esomeprazole Magnesium (NEXIUM PO) Take 22.5 mg by mouth daily.  . fluocinonide-emollient (LIDEX-E) 0.05 % cream Apply topically as needed.  . furosemide (LASIX) 20 MG tablet Take 20 mg by mouth 2 (two) times daily as needed. Fluid  . Multiple Vitamins-Minerals (HM MULTIVITAMIN ADULT GUMMY PO) Take 1 tablet by mouth daily.   . potassium chloride (K-DUR) 10 MEQ tablet Take 10 mEq by mouth 2 (two) times daily as needed. Fluid  . sitaGLIPtan-metformin (JANUMET) 50-1000 MG per tablet Take 1 tablet by mouth 2 (two) times daily with a meal.  . traMADol (ULTRAM) 50 MG tablet Take 1 tablet (50 mg total) by mouth every 8 (eight) hours as needed.  Marland Kitchen VYTORIN 10-40 MG per tablet TAKE 1 TABLET BY MOUTH DAILY  . zolpidem (AMBIEN) 10 MG tablet TAKE 1 TABLET BY MOUTH AT BEDTIME AS NEEDED FOR SLEEP   Review of Systems  Constitutional: Negative.   HENT: Negative.   Eyes: Negative.   Respiratory: Negative.   Cardiovascular: Negative.   Gastrointestinal: Negative.   Endocrine: Negative.   Musculoskeletal: Positive for arthralgias, gait problem and joint swelling.  Skin: Negative.   Allergic/Immunologic: Negative.   Neurological: Negative.   Hematological: Negative.   Psychiatric/Behavioral: Negative.   All other systems reviewed and are negative.    BP 140/82  Pulse 71  Ht 5' 1.5" (1.562 m)  Wt 210 lb 12 oz (95.596 kg)  BMI 39.18 kg/m2  Physical Exam  Nursing note and vitals reviewed. Constitutional: She is oriented to person, place, and time. She appears well-developed and well-nourished.  HENT:  Head: Normocephalic.  Nose: Nose normal.  Mouth/Throat: Oropharynx is clear and moist.  Eyes: Conjunctivae are normal. Pupils are equal, round, and reactive to light.  Neck: Normal range of motion. Neck supple. No JVD  present.  Cardiovascular: Regular rhythm, S1 normal, S2 normal, normal heart sounds and intact distal pulses.  Tachycardia present.  Exam reveals no gallop and no friction rub.   No murmur heard. Pulmonary/Chest: Effort normal and breath sounds normal. No respiratory distress. She has no wheezes. She has no rales. She exhibits no tenderness.  Abdominal: Soft. Bowel sounds are normal. She exhibits no distension. There is no tenderness.  Musculoskeletal: Normal range of motion. She exhibits no edema and no  tenderness.  Lymphadenopathy:    She has no cervical adenopathy.  Neurological: She is alert and oriented to person, place, and time. Coordination normal.  Skin: Skin is warm and dry. No rash noted. No erythema.  Psychiatric: She has a normal mood and affect. Her behavior is normal. Judgment and thought content normal.    Assessment and Plan

## 2013-12-28 NOTE — Patient Instructions (Addendum)
You are doing well. Please use up the vytorin and the bystolic  Start simvastatin 40 mg daily Start metoprolol succinate 25 mg   We will check blood work today, lipids  Please call us if you have new issues that need to be addressed before your next appt.  Your physician wants you to follow-up in: 12 months.  You will receive a reminder letter in the mail two months in advance. If you don't receive a letter, please call our office to schedule the follow-up appointment.

## 2013-12-29 LAB — HEPATIC FUNCTION PANEL
ALK PHOS: 114 IU/L (ref 39–117)
ALT: 25 IU/L (ref 0–32)
AST: 22 IU/L (ref 0–40)
Albumin: 4.3 g/dL (ref 3.6–4.8)
BILIRUBIN DIRECT: 0.15 mg/dL (ref 0.00–0.40)
BILIRUBIN TOTAL: 0.5 mg/dL (ref 0.0–1.2)
Total Protein: 5.9 g/dL — ABNORMAL LOW (ref 6.0–8.5)

## 2013-12-29 LAB — LIPID PANEL
CHOL/HDL RATIO: 4.8 ratio — AB (ref 0.0–4.4)
CHOLESTEROL TOTAL: 171 mg/dL (ref 100–199)
HDL: 36 mg/dL — AB (ref >39)
LDL Calculated: 100 mg/dL — ABNORMAL HIGH (ref 0–99)
TRIGLYCERIDES: 177 mg/dL — AB (ref 0–149)
VLDL Cholesterol Cal: 35 mg/dL (ref 5–40)

## 2014-01-09 ENCOUNTER — Other Ambulatory Visit: Payer: Self-pay

## 2014-01-09 DIAGNOSIS — E785 Hyperlipidemia, unspecified: Secondary | ICD-10-CM

## 2014-02-14 ENCOUNTER — Other Ambulatory Visit: Payer: Self-pay | Admitting: Family Medicine

## 2014-02-14 NOTE — Telephone Encounter (Signed)
Lm on pts vm informing her Rx has been called in to requested pharmacy

## 2014-03-19 ENCOUNTER — Other Ambulatory Visit: Payer: Self-pay | Admitting: Family Medicine

## 2014-04-01 ENCOUNTER — Encounter: Payer: Self-pay | Admitting: Family Medicine

## 2014-04-01 ENCOUNTER — Ambulatory Visit (INDEPENDENT_AMBULATORY_CARE_PROVIDER_SITE_OTHER): Payer: 59 | Admitting: Family Medicine

## 2014-04-01 VITALS — BP 122/88 | HR 83 | Temp 98.0°F | Wt 212.0 lb

## 2014-04-01 DIAGNOSIS — G8929 Other chronic pain: Secondary | ICD-10-CM | POA: Insufficient documentation

## 2014-04-01 DIAGNOSIS — M5416 Radiculopathy, lumbar region: Secondary | ICD-10-CM

## 2014-04-01 DIAGNOSIS — E785 Hyperlipidemia, unspecified: Secondary | ICD-10-CM

## 2014-04-01 DIAGNOSIS — G2581 Restless legs syndrome: Secondary | ICD-10-CM

## 2014-04-01 DIAGNOSIS — I10 Essential (primary) hypertension: Secondary | ICD-10-CM

## 2014-04-01 DIAGNOSIS — E119 Type 2 diabetes mellitus without complications: Secondary | ICD-10-CM

## 2014-04-01 MED ORDER — GLIPIZIDE 5 MG PO TABS: 5.0000 mg | ORAL_TABLET | Freq: Every day | ORAL | Status: DC

## 2014-04-01 MED ORDER — BUPROPION HCL ER (XL) 300 MG PO TB24: ORAL_TABLET | ORAL | Status: DC

## 2014-04-01 MED ORDER — TRAMADOL HCL 50 MG PO TABS: 50.0000 mg | ORAL_TABLET | Freq: Three times a day (TID) | ORAL | Status: DC | PRN

## 2014-04-01 MED ORDER — GABAPENTIN 100 MG PO CAPS: ORAL_CAPSULE | ORAL | Status: DC

## 2014-04-01 MED ORDER — METFORMIN HCL 1000 MG PO TABS: 1000.0000 mg | ORAL_TABLET | Freq: Two times a day (BID) | ORAL | Status: DC

## 2014-04-01 NOTE — Patient Instructions (Signed)
Good to see you.

## 2014-04-01 NOTE — Progress Notes (Signed)
Pre visit review using our clinic review tool, if applicable. No additional management support is needed unless otherwise documented below in the visit note. 

## 2014-04-01 NOTE — Assessment & Plan Note (Signed)
Well controlled on metoprolol

## 2014-04-01 NOTE — Progress Notes (Signed)
Subjective:   Patient ID: Brenda [name], female    DOB: 1951-08-28, 63 y.o.   MRN: [id number]  Brenda [name] is a pleasant 63 y.o. year old female who presents to clinic today with Medication Refill  on 04/01/2014  HPI: DM-  Checks her FSBS daily. FSBS 168.  Denies any hypoglycemia. Janumet- 1 tablet twice daily. Wants to switch to all generic medications since she is now on medicare. Dr. Rockey Situ switched to simvastatin and metoprolol.  Lab Results  Component Value Date   HGBA1C 7.2* 10/24/2012   HLD- on Simvastatin now. Denies myalgias. Lab Results  Component Value Date   CHOL 176 04/15/2011   HDL 36* 12/28/2013   LDLCALC 100* 12/28/2013   TRIG 177* 12/28/2013   CHOLHDL 4.8* 12/28/2013   HTN- on metoprolol 25 mg XL- has been fatigued but denies any other symptoms.  RLS- symptoms have worsened over the past year. Lab Results  Component Value Date   NA 142 04/04/2013   K 4.9 04/04/2013   CL 107 04/04/2013   CO2 29 04/04/2013   Current Outpatient Prescriptions on File Prior to Visit  Medication Sig Dispense Refill  . aspirin 81 MG tablet Take 81 mg by mouth daily.      . benazepril-hydrochlorthiazide (LOTENSIN HCT) 20-12.5 MG per tablet TAKE 1 TABLET BY MOUTH DAILY.  30 tablet  5  . Calcium Carbonate-Vit D-Min (CALCIUM 1200 PO) Take 1 capsule by mouth daily.      . cholecalciferol (VITAMIN D) 1000 UNITS tablet Take 2,000 Units by mouth daily.       . clonazePAM (KLONOPIN) 0.5 MG tablet TAKE 2 TABLETS BY MOUTH 3 TIMES A DAY AS NEEDED FOR ANXIETY  90 tablet  0  . Esomeprazole Magnesium (NEXIUM PO) Take 22.5 mg by mouth daily.      . fluocinonide-emollient (LIDEX-E) 0.05 % cream Apply topically as needed.      . metoprolol succinate (TOPROL XL) 25 MG 24 hr tablet Take 1 tablet (25 mg total) by mouth daily.  90 tablet  3  . Multiple Vitamins-Minerals (HM MULTIVITAMIN ADULT GUMMY PO) Take 1 tablet by mouth daily.      . simvastatin (ZOCOR) 40 MG tablet Take 1 tablet (40 mg total)  by mouth at bedtime.  90 tablet  3   No current facility-administered medications on file prior to visit.    No Known Allergies  Past Medical History  Diagnosis Date  . Depression   . Diabetes mellitus   . GERD (gastroesophageal reflux disease)   . Hyperlipidemia   . Hypertension   . Heart murmur   . Endometrial ca     1998  . Heel spur   . PONV (postoperative nausea and vomiting)     after hysterectomy    Past Surgical History  Procedure Laterality Date  . Abdominal hysterectomy  1998  . Left knee arthroscopy  12/2010  . Total knee arthroplasty  12/20/2011    Procedure: TOTAL KNEE ARTHROPLASTY;  Surgeon: Gearlean Alf, MD;  Location: [facility];  Service: Orthopedics;  Laterality: Left;  Failed attempt at spinal   . Femur im nail  10/25/2012    Procedure: INTRAMEDULLARY (IM) NAIL FEMORAL;  Surgeon: Mauri Pole, MD;  Location: [facility];  Service: Orthopedics;  Laterality: Left;  . Hip surgery      Family History  Problem Relation Age of Onset  . Cancer Mother     breast cancer  . Cancer Father  plasma sarcoma    History   Social History  . Marital Status: Single    Spouse Name: N/A    Number of Children: N/A  . Years of Education: N/A   Occupational History  . RN at [employer] History Main Topics  . Smoking status: Never Smoker   . Smokeless tobacco: Never Used  . Alcohol Use: No  . Drug Use: No  . Sexual Activity: Not on file   Other Topics Concern  . 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    Review of Systems    See HPI NO CP or SOB Objective:    BP 122/88  Pulse 83  Temp(Src) 98 F (36.7 C) (Oral)  Wt 212 lb (96.163 kg)  SpO2 97%   Physical Exam  Nursing note and vitals reviewed. Constitutional: She appears well-developed and well-nourished. No  distress.  Neck: Normal range of motion.  Cardiovascular: Normal rate and regular rhythm.   Pulmonary/Chest: Effort normal.  Abdominal: Soft. Bowel sounds are normal.  Skin: Skin is warm and dry.  Psychiatric: She has a normal mood and affect. Her behavior is normal. Judgment and thought content normal.          Assessment & Plan:   DIABETES MELLITUS, TYPE II No Follow-up on file.

## 2014-04-01 NOTE — Assessment & Plan Note (Signed)
Now on simvastatin. Dr. Rockey Situ will recheck cholesterol.

## 2014-04-01 NOTE — Assessment & Plan Note (Signed)
Due for labs but she prefer to wait. Try Gabapentin 100-300 mg qhs prn.

## 2014-04-01 NOTE — Assessment & Plan Note (Signed)
On statin. Change rx to metformin 1000 mg twice daily and glipizide 5 mg daily. Check labs and urine micro in 6 months. The patient indicates understanding of these issues and agrees with the plan.

## 2014-04-08 ENCOUNTER — Other Ambulatory Visit: Payer: Self-pay | Admitting: Family Medicine

## 2014-04-08 NOTE — Telephone Encounter (Signed)
Pt requesting medication refill. Last f/u appt 04/2014 with f/u appt 10/2014. pls advise

## 2014-04-08 NOTE — Telephone Encounter (Signed)
Lm on pts vm informing her Rx has been called in to requested pharmacy

## 2014-05-06 ENCOUNTER — Encounter: Payer: Self-pay | Admitting: Family Medicine

## 2014-05-06 ENCOUNTER — Ambulatory Visit (INDEPENDENT_AMBULATORY_CARE_PROVIDER_SITE_OTHER): Payer: 59 | Admitting: Family Medicine

## 2014-05-06 VITALS — BP 142/90 | HR 84 | Temp 98.7°F | Wt 208.2 lb

## 2014-05-06 DIAGNOSIS — J029 Acute pharyngitis, unspecified: Secondary | ICD-10-CM

## 2014-05-06 LAB — POCT RAPID STREP A (OFFICE): RAPID STREP A SCREEN: NEGATIVE

## 2014-05-06 NOTE — Patient Instructions (Signed)
You have an acute pharyngitis, likely viral. Push fluids and plenty of rest. May use ibuprofen or aleve with food for throat inflammation. Salt water gargles. Suck on cold things like popsicles or warm things like herbal teas (whichever soothes the throat better). Return if fever >101.5, worsening persistent pain, or trouble opening/closing mouth, or persistent hoarse voice, or not improving as expected. Good to see you today, call clinic with questions.

## 2014-05-06 NOTE — Addendum Note (Signed)
Addended by: Royann Shivers A on: 05/06/2014 04:28 PM   Modules accepted: Orders

## 2014-05-06 NOTE — Progress Notes (Signed)
BP 142/90  Pulse 84  Temp(Src) 98.7 F (37.1 C) (Oral)  Wt 208 lb 4 oz (94.462 kg)   CC: ST  Subjective:    Patient ID: Brenda [name], female    DOB: 12-24-1950, 63 y.o.   MRN: [id number]  HPI: Brenda [name] is a 63 y.o. female presenting on 05/06/2014 for Sore Throat   5d h/o ST as well as bilateral earaches.  Initially with globus sensation that has now improved.  Some headaches. + PNdrainage. Voice is somewhat hoarse as well.   No fevers/chills, congestion, cough, sneezing, rhinorrhea, abd pain.  So far has tried ibuprofen for this.  No h/o allergic rhinitis, no h/o asthma.  H/o GERD but controlled with nexium - no breakthrough symptoms.   No sick contacts at home or work.  Never smoker.  Relevant past medical, surgical, family and social history reviewed and updated as indicated.  Allergies and medications reviewed and updated. Current Outpatient Prescriptions on File Prior to Visit  Medication Sig  . aspirin 81 MG tablet Take 81 mg by mouth daily.  . benazepril-hydrochlorthiazide (LOTENSIN HCT) 20-12.5 MG per tablet TAKE 1 TABLET BY MOUTH DAILY.  Marland Kitchen buPROPion (WELLBUTRIN XL) 300 MG 24 hr tablet TAKE 1 TABLET BY MOUTH ONCE DAILY  . Calcium Carbonate-Vit D-Min (CALCIUM 1200 PO) Take 1 capsule by mouth daily.  . cholecalciferol (VITAMIN D) 1000 UNITS tablet Take 2,000 Units by mouth daily.   . clonazePAM (KLONOPIN) 0.5 MG tablet TAKE 2 TABLETS BY MOUTH THREE TIMES PER DAY AS NEEDED FOR ANXIETY  . Esomeprazole Magnesium (NEXIUM PO) Take 22.5 mg by mouth daily.  . fluocinonide-emollient (LIDEX-E) 0.05 % cream Apply topically as needed.  . gabapentin (NEURONTIN) 100 MG capsule 1-3 capsules at bedtime.  Marland Kitchen glipiZIDE (GLUCOTROL) 5 MG tablet Take 1 tablet (5 mg total) by mouth daily before breakfast.  . metFORMIN (GLUCOPHAGE) 1000 MG tablet Take 1 tablet (1,000 mg total) by mouth 2 (two) times daily with a meal.  . metoprolol succinate (TOPROL XL) 25 MG 24 hr tablet Take 1  tablet (25 mg total) by mouth daily.  . Multiple Vitamins-Minerals (HM MULTIVITAMIN ADULT GUMMY PO) Take 1 tablet by mouth daily.  . simvastatin (ZOCOR) 40 MG tablet Take 1 tablet (40 mg total) by mouth at bedtime.  . traMADol (ULTRAM) 50 MG tablet Take 1 tablet (50 mg total) by mouth every 8 (eight) hours as needed.   No current facility-administered medications on file prior to visit.    Review of Systems Per HPI unless specifically indicated above    Objective:    BP 142/90  Pulse 84  Temp(Src) 98.7 F (37.1 C) (Oral)  Wt 208 lb 4 oz (94.462 kg)  Physical Exam  Nursing note and vitals reviewed. Constitutional: She appears well-developed and well-nourished. No distress.  HENT:  Head: Normocephalic and atraumatic.  Right Ear: Hearing, tympanic membrane, external ear and ear canal normal.  Left Ear: Hearing, tympanic membrane, external ear and ear canal normal.  Nose: No mucosal edema or rhinorrhea. Right sinus exhibits no maxillary sinus tenderness and no frontal sinus tenderness. Left sinus exhibits no maxillary sinus tenderness and no frontal sinus tenderness.  Mouth/Throat: Uvula is midline, oropharynx is clear and moist and mucous membranes are normal. No oropharyngeal exudate, posterior oropharyngeal edema, posterior oropharyngeal erythema or tonsillar abscesses.  Cerumen bilaterally  Eyes: Conjunctivae and EOM are normal. Pupils are equal, round, and reactive to light. No scleral icterus.  Neck: Normal range of motion. Neck supple.  No thyromegaly present.  Cardiovascular: Normal rate, regular rhythm, normal heart sounds and intact distal pulses.   No murmur heard. Pulmonary/Chest: Effort normal and breath sounds normal. No respiratory distress. She has no wheezes. She has no rales.  Lymphadenopathy:    She has no cervical adenopathy.  Skin: Skin is warm and dry. No rash noted.       Assessment & Plan:   Problem List Items Addressed This Visit   Acute pharyngitis -  Primary     Of 5d duration, unclear etiology. Not consistent with viral. Check RST today. If negative, will rec supportive care as per instructions for next several days, update [place] if sxs persist. Not consistent with GERD cause or with allergic rhinitis cause.        Follow up plan: Return if symptoms worsen or fail to improve.

## 2014-05-06 NOTE — Progress Notes (Signed)
Pre visit review using our clinic review tool, if applicable. No additional management support is needed unless otherwise documented below in the visit note. 

## 2014-05-06 NOTE — Assessment & Plan Note (Signed)
Of 5d duration, unclear etiology. Not consistent with viral. Check RST today. If negative, will rec supportive care as per instructions for next several days, update [place] if sxs persist. Not consistent with GERD cause or with allergic rhinitis cause.

## 2014-05-15 ENCOUNTER — Other Ambulatory Visit: Payer: Self-pay

## 2014-05-15 DIAGNOSIS — Z1231 Encounter for screening mammogram for malignant neoplasm of breast: Secondary | ICD-10-CM

## 2014-05-23 ENCOUNTER — Other Ambulatory Visit: Payer: Self-pay | Admitting: Family Medicine

## 2014-05-23 ENCOUNTER — Ambulatory Visit
Admission: RE | Admit: 2014-05-23 | Discharge: 2014-05-23 | Disposition: A | Discharge status: Home / Self Care | Payer: 59 | Source: Ambulatory Visit

## 2014-05-23 DIAGNOSIS — Z1231 Encounter for screening mammogram for malignant neoplasm of breast: Secondary | ICD-10-CM

## 2014-05-30 ENCOUNTER — Other Ambulatory Visit: Payer: Self-pay | Admitting: Family Medicine

## 2014-05-30 NOTE — Telephone Encounter (Signed)
Pt requesting medication refill. Last f/u appt 04/2014 with f/u appt 10/2014. pls advise

## 2014-05-30 NOTE — Telephone Encounter (Signed)
Rx faxed to requested pharmacy

## 2014-06-04 ENCOUNTER — Encounter: Payer: Self-pay | Admitting: Gastroenterology

## 2014-07-19 ENCOUNTER — Other Ambulatory Visit: Payer: Self-pay | Admitting: Family Medicine

## 2014-07-19 NOTE — Telephone Encounter (Signed)
Pt requesting medication refill. Last f/u appt 04/2014. Ok to fill per Dr Deborra Medina. Rx to be faxed to requested pharmacy before end of day

## 2014-08-20 ENCOUNTER — Other Ambulatory Visit: Payer: Self-pay | Admitting: Family Medicine

## 2014-08-20 NOTE — Telephone Encounter (Signed)
Pt requesting medication refill. Last f/u appt 04/2014. Ok to fill per Dr Deborra Medina. Rx to be faxed to requested pharmacy before end of day, today.

## 2014-09-03 ENCOUNTER — Other Ambulatory Visit: Payer: Self-pay | Admitting: Family Medicine

## 2014-09-06 ENCOUNTER — Other Ambulatory Visit: Payer: Self-pay | Admitting: *Deleted

## 2014-09-06 MED ORDER — CLONAZEPAM 0.5 MG PO TABS: 1.0000 mg | ORAL_TABLET | Freq: Three times a day (TID) | ORAL | Status: DC | PRN

## 2014-09-06 NOTE — Telephone Encounter (Signed)
rx called into pharmacy

## 2014-09-06 NOTE — Telephone Encounter (Signed)
07/19/14 

## 2014-09-13 ENCOUNTER — Encounter (HOSPITAL_COMMUNITY): Payer: Self-pay | Admitting: *Deleted

## 2014-09-13 ENCOUNTER — Emergency Department (HOSPITAL_COMMUNITY)
Admission: EM | Admit: 2014-09-13 | Discharge: 2014-09-13 | Disposition: A | Discharge status: Home / Self Care | Payer: 59 | Attending: Emergency Medicine | Admitting: Emergency Medicine

## 2014-09-13 ENCOUNTER — Emergency Department (HOSPITAL_COMMUNITY): Payer: 59

## 2014-09-13 DIAGNOSIS — F329 Major depressive disorder, single episode, unspecified: Secondary | ICD-10-CM | POA: Diagnosis not present

## 2014-09-13 DIAGNOSIS — E119 Type 2 diabetes mellitus without complications: Secondary | ICD-10-CM | POA: Insufficient documentation

## 2014-09-13 DIAGNOSIS — I10 Essential (primary) hypertension: Secondary | ICD-10-CM | POA: Diagnosis not present

## 2014-09-13 DIAGNOSIS — E785 Hyperlipidemia, unspecified: Secondary | ICD-10-CM | POA: Diagnosis not present

## 2014-09-13 DIAGNOSIS — Y998 Other external cause status: Secondary | ICD-10-CM | POA: Insufficient documentation

## 2014-09-13 DIAGNOSIS — K219 Gastro-esophageal reflux disease without esophagitis: Secondary | ICD-10-CM | POA: Insufficient documentation

## 2014-09-13 DIAGNOSIS — W0110XA Fall on same level from slipping, tripping and stumbling with subsequent striking against unspecified object, initial encounter: Secondary | ICD-10-CM | POA: Diagnosis not present

## 2014-09-13 DIAGNOSIS — S0992XA Unspecified injury of nose, initial encounter: Secondary | ICD-10-CM | POA: Diagnosis not present

## 2014-09-13 DIAGNOSIS — Y9289 Other specified places as the place of occurrence of the external cause: Secondary | ICD-10-CM | POA: Diagnosis not present

## 2014-09-13 DIAGNOSIS — Z7982 Long term (current) use of aspirin: Secondary | ICD-10-CM | POA: Insufficient documentation

## 2014-09-13 DIAGNOSIS — Y9389 Activity, other specified: Secondary | ICD-10-CM | POA: Diagnosis not present

## 2014-09-13 DIAGNOSIS — Z79899 Other long term (current) drug therapy: Secondary | ICD-10-CM | POA: Insufficient documentation

## 2014-09-13 DIAGNOSIS — S022XXA Fracture of nasal bones, initial encounter for closed fracture: Secondary | ICD-10-CM

## 2014-09-13 DIAGNOSIS — Z8739 Personal history of other diseases of the musculoskeletal system and connective tissue: Secondary | ICD-10-CM | POA: Diagnosis not present

## 2014-09-13 DIAGNOSIS — R011 Cardiac murmur, unspecified: Secondary | ICD-10-CM | POA: Diagnosis not present

## 2014-09-13 DIAGNOSIS — R040 Epistaxis: Secondary | ICD-10-CM | POA: Diagnosis not present

## 2014-09-13 MED ORDER — COCAINE HCL 4 % EX SOLN
4.0000 mL | Freq: Once | CUTANEOUS | Status: AC
  Administered 2014-09-13: 4 mL via TOPICAL
  Filled 2014-09-13: qty 4

## 2014-09-13 NOTE — ED Notes (Addendum)
Pt has a nose bleed starting this after noon around 1630. Pt states she tripped and fell flat on her nose. Pt presents with minimal bleeding. Pt is not on blood thinners. Pt states just prior to arrival to ED a large clot came out of nose. Pt denies LOC from fall. Pt denies head and neck pain.

## 2014-09-13 NOTE — Discharge Instructions (Signed)
Nasal Fracture A nasal fracture is a break or crack in the bones of the nose. A minor break usually heals in a month. You often will receive black eyes from a nasal fracture. This is not a cause for concern. The black eyes will go away over 1 to 2 weeks.  DIAGNOSIS  Your caregiver may want to examine you if you are concerned about a fracture of the nose. X-rays of the nose may not show a nasal fracture even when one is present. Sometimes your caregiver must wait 1 to 5 days after the injury to re-check the nose for alignment and to take additional X-rays. Sometimes the caregiver must wait until the swelling has gone down. TREATMENT Minor fractures that have caused no deformity often do not require treatment. More serious fractures where bones are displaced may require surgery. This will take place after the swelling is gone. Surgery will stabilize and align the fracture. HOME CARE INSTRUCTIONS   Put ice on the injured area.  Put ice in a plastic bag.  Place a towel between your skin and the bag.  Leave the ice on for 15-20 minutes, 03-04 times a day.  Take medications as directed by your caregiver.  Only take over-the-counter or prescription medicines for pain, discomfort, or fever as directed by your caregiver.  If your nose starts bleeding, squeeze the soft parts of the nose against the center wall while you are sitting in an upright position for 10 minutes.  Contact sports should be avoided for at least 3 to 4 weeks or as directed by your caregiver. SEEK MEDICAL CARE IF:  Your pain increases or becomes severe.  You continue to have nosebleeds.  The shape of your nose does not return to normal within 5 days.  You have pus draining from the nose. SEEK IMMEDIATE MEDICAL CARE IF:   You have bleeding from your nose that does not stop after 20 minutes of pinching the nostrils closed and keeping ice on the nose.  You have clear fluid draining from your nose.  You notice a grape-like  swelling on the dividing wall between the nostrils (septum). This is a collection of blood (hematoma) that must be drained to help prevent infection.  You have difficulty moving your eyes.  You have recurrent vomiting. Document Released: 10/15/2000 Document Revised: 01/10/2012 Document Reviewed: 02/01/2011 [facility] Patient Information 2015 [place], [place]. This information is not intended to replace advice given to you by your health care provider. Make sure you discuss any questions you have with your health care provider. Nosebleed Nosebleeds can be caused by many conditions, including trauma, infections, polyps, foreign bodies, dry mucous membranes or climate, medicines, and air conditioning. Most nosebleeds occur in the front of the nose. Because of this location, most nosebleeds can be controlled by pinching the nostrils gently and continuously for at least 10 to 20 minutes. The long, continuous pressure allows enough time for the blood to clot. If pressure is released during that 10 to 20 minute time period, the process may have to be started again. The nosebleed may stop by itself or quit with pressure, or it may need concentrated heating (cautery) or pressure from packing. HOME CARE INSTRUCTIONS   If your nose was packed, try to maintain the pack inside until your health care provider removes it. If a gauze pack was used and it starts to fall out, gently replace it or cut the end off. Do not cut if a balloon catheter was used to pack the  nose. Otherwise, do not remove unless instructed.  Avoid blowing your nose for 12 hours after treatment. This could dislodge the pack or clot and start the bleeding again.  If the bleeding starts again, sit up and bend forward, gently pinching the front half of your nose continuously for 20 minutes.  If bleeding was caused by dry mucous membranes, use over-the-counter saline nasal spray or gel. This will keep the mucous membranes moist and allow them to  heal. If you must use a lubricant, choose the water-soluble variety. Use it only sparingly and not within several hours of lying down.  Do not use petroleum jelly or mineral oil, as these may drip into the lungs and cause serious problems.  Maintain humidity in your home by using less air conditioning or by using a humidifier.  Do not use aspirin or medicines which make bleeding more likely. Your health care provider can give you recommendations on this.  Resume normal activities as you are able, but try to avoid straining, lifting, or bending at the waist for several days.  If the nosebleeds become recurrent and the cause is unknown, your health care provider may suggest laboratory tests. SEEK MEDICAL CARE IF: You have a fever. SEEK IMMEDIATE MEDICAL CARE IF:   Bleeding recurs and cannot be controlled.  There is unusual bleeding from or bruising on other parts of the body.  Nosebleeds continue.  There is any worsening of the condition which originally brought you in.  You become light-headed, feel faint, become sweaty, or vomit blood. MAKE SURE YOU:   Understand these instructions.  Will watch your condition.  Will get help right away if you are not doing well or get worse. Document Released: 07/28/2005 Document Revised: 03/04/2014 Document Reviewed: 09/18/2009 [facility] Patient Information 2015 [place], [place]. This information is not intended to replace advice given to you by your health care provider. Make sure you discuss any questions you have with your health care provider.

## 2014-09-13 NOTE — ED Notes (Signed)
Dr.Belfi at bedside

## 2014-09-13 NOTE — ED Provider Notes (Signed)
CSN: [id number]     Arrival date & time 09/13/14  2025 History   First MD Initiated Contact with Patient 09/13/14 2103     Chief Complaint  Patient presents with  . Epistaxis     (Consider location/radiation/quality/duration/timing/severity/associated sxs/prior Treatment) Patient is a 63 y.o. female presenting with nosebleeds. The history is provided by the patient. No language interpreter was used.  Epistaxis Location:  R nare Duration:  4 hours Timing:  Intermittent Progression:  Waxing and waning Chronicity:  New Context: not anticoagulants   Ineffective treatments:  Applying pressure Associated symptoms: blood in oropharynx     Past Medical History  Diagnosis Date  . Depression   . Diabetes mellitus   . GERD (gastroesophageal reflux disease)   . Hyperlipidemia   . Hypertension   . Heart murmur   . Endometrial ca     1998  . Heel spur   . PONV (postoperative nausea and vomiting)     after hysterectomy   Past Surgical History  Procedure Laterality Date  . Abdominal hysterectomy  1998  . Left knee arthroscopy  12/2010  . Total knee arthroplasty  12/20/2011    Procedure: TOTAL KNEE ARTHROPLASTY;  Surgeon: Gearlean Alf, MD;  Location: [facility];  Service: Orthopedics;  Laterality: Left;  Failed attempt at spinal   . Femur im nail  10/25/2012    Procedure: INTRAMEDULLARY (IM) NAIL FEMORAL;  Surgeon: Mauri Pole, MD;  Location: [facility];  Service: Orthopedics;  Laterality: Left;  . Hip surgery     Family History  Problem Relation Age of Onset  . Cancer Mother     breast cancer  . Cancer Father     plasma sarcoma   History  Substance Use Topics  . Smoking status: Never Smoker   . Smokeless tobacco: Never Used  . Alcohol Use: No   OB History    No data available     Review of Systems  HENT: Positive for nosebleeds.   All other systems reviewed and are negative.     Allergies  Review of patient's allergies indicates no known allergies.  Home  Medications   Prior to Admission medications   Medication Sig Start Date End Date Taking? Authorizing Provider  aspirin 81 MG tablet Take 81 mg by mouth daily.    Historical Provider, MD  benazepril-hydrochlorthiazide (LOTENSIN HCT) 20-12.5 MG per tablet TAKE 1 TABLET BY MOUTH DAILY. 05/23/14   Lucille Passy, MD  buPROPion (WELLBUTRIN XL) 300 MG 24 hr tablet TAKE 1 TABLET BY MOUTH ONCE DAILY 04/01/14   Lucille Passy, MD  Calcium Carbonate-Vit D-Min (CALCIUM 1200 PO) Take 1 capsule by mouth daily.    Historical Provider, MD  cholecalciferol (VITAMIN D) 1000 UNITS tablet Take 2,000 Units by mouth daily.     Historical Provider, MD  clonazePAM (KLONOPIN) 0.5 MG tablet Take 2 tablets (1 mg total) by mouth 3 (three) times daily as needed for anxiety. 09/06/14   Lucille Passy, MD  Esomeprazole Magnesium (NEXIUM PO) Take 22.5 mg by mouth daily.    Historical Provider, MD  fluocinonide-emollient (LIDEX-E) 0.05 % cream Apply topically as needed. 04/04/13   Lucille Passy, MD  gabapentin (NEURONTIN) 100 MG capsule 1-3 capsules at bedtime. 04/01/14   Lucille Passy, MD  glipiZIDE (GLUCOTROL) 5 MG tablet TAKE 1 TABLET BY MOUTH DAILY BEFORE BREAKFAST. 09/03/14   Lucille Passy, MD  metFORMIN (GLUCOPHAGE) 1000 MG tablet Take 1 tablet (1,000 mg total) by mouth 2 (  two) times daily with a meal. 04/01/14   Lucille Passy, MD  metoprolol succinate (TOPROL XL) 25 MG 24 hr tablet Take 1 tablet (25 mg total) by mouth daily. 12/28/13   Minna Merritts, MD  Multiple Vitamins-Minerals (HM MULTIVITAMIN ADULT GUMMY PO) Take 1 tablet by mouth daily.    Historical Provider, MD  simvastatin (ZOCOR) 40 MG tablet Take 1 tablet (40 mg total) by mouth at bedtime. 12/28/13   Minna Merritts, MD  traMADol (ULTRAM) 50 MG tablet TAKE 1 TABLET BY MOUTH EVERY 8 HOURS AS NEEDED 08/20/14   Lucille Passy, MD   BP 157/100 mmHg  Pulse 112  Temp(Src) 97.2 F (36.2 C) (Oral)  Resp 22  SpO2 99% Physical Exam  Constitutional: She is oriented to person, place,  and time. She appears well-developed and well-nourished.  HENT:  Head: Normocephalic.  Eyes: Pupils are equal, round, and reactive to light.  Neck: Normal range of motion.  Cardiovascular: Normal rate and regular rhythm.   Pulmonary/Chest: Effort normal and breath sounds normal.  Abdominal: Soft.  Musculoskeletal: Normal range of motion.  Neurological: She is alert and oriented to person, place, and time.  Skin: Skin is warm and dry.  Psychiatric: She has a normal mood and affect.  Nursing note and vitals reviewed.   ED Course  Procedures (including critical care time) Labs Review Labs Reviewed - No data to display  Imaging Review No results found.   EKG Interpretation None     Epistaxis s/p fall at home today. Controlled in ED with topical cocaine application.  Radiology results reviewed, fracture of tip of nasal bone.  PCP/ENT follow-up.  Patient discussed with and seen by Dr. Tamera Punt. MDM   Final diagnoses:  None    Nasal fracture. Epistaxis.    Norman Herrlich, NP 09/14/14 [id number]  Malvin Johns, MD 09/14/14 [phone]

## 2014-09-30 ENCOUNTER — Other Ambulatory Visit: Payer: Self-pay | Admitting: Family Medicine

## 2014-10-01 ENCOUNTER — Encounter: Payer: Self-pay | Admitting: Family Medicine

## 2014-10-01 ENCOUNTER — Ambulatory Visit (INDEPENDENT_AMBULATORY_CARE_PROVIDER_SITE_OTHER): Payer: 59 | Admitting: Family Medicine

## 2014-10-01 VITALS — BP 128/70 | HR 69 | Temp 98.1°F | Wt 207.0 lb

## 2014-10-01 DIAGNOSIS — G2581 Restless legs syndrome: Secondary | ICD-10-CM

## 2014-10-01 DIAGNOSIS — Z23 Encounter for immunization: Secondary | ICD-10-CM

## 2014-10-01 DIAGNOSIS — E119 Type 2 diabetes mellitus without complications: Secondary | ICD-10-CM

## 2014-10-01 DIAGNOSIS — E785 Hyperlipidemia, unspecified: Secondary | ICD-10-CM

## 2014-10-01 DIAGNOSIS — I10 Essential (primary) hypertension: Secondary | ICD-10-CM

## 2014-10-01 LAB — MICROALBUMIN / CREATININE URINE RATIO
Creatinine,U: 126.2 mg/dL
MICROALB UR: 8.9 mg/dL — AB (ref 0.0–1.9)
Microalb Creat Ratio: 7.1 mg/g (ref 0.0–30.0)

## 2014-10-01 LAB — HEMOGLOBIN A1C: Hgb A1c MFr Bld: 12.2 % — ABNORMAL HIGH (ref 4.6–6.5)

## 2014-10-01 MED ORDER — GABAPENTIN 300 MG PO CAPS: ORAL_CAPSULE | ORAL | Status: DC

## 2014-10-01 NOTE — Patient Instructions (Signed)
Great to see you. Happy Holidays.  We will call you with your lab results.

## 2014-10-01 NOTE — Assessment & Plan Note (Addendum)
Due for labs and urine micro. On ACEI and Statin.   Check labs today. prevnar 13 given today as well.

## 2014-10-01 NOTE — Assessment & Plan Note (Signed)
Deteriorated- increase Gabapentin to 300-600 mg qhs prn. The patient indicates understanding of these issues and agrees with the plan.

## 2014-10-01 NOTE — Progress Notes (Signed)
Pre visit review using our clinic review tool, if applicable. No additional management support is needed unless otherwise documented below in the visit note. 

## 2014-10-01 NOTE — Assessment & Plan Note (Signed)
Well controlled- at goal for diabetic.

## 2014-10-01 NOTE — Assessment & Plan Note (Signed)
At goal for diabetic. Check labs, continue statin.

## 2014-10-01 NOTE — Addendum Note (Signed)
Addended by: Modena Nunnery on: 10/01/2014 12:44 PM   Modules accepted: Orders

## 2014-10-01 NOTE — Progress Notes (Addendum)
Subjective:   Patient ID: Brenda [name], female    DOB: 1951/01/27, 63 y.o.   MRN: [id number]  Brenda [name] is a pleasant 63 y.o. year old female who presents to clinic today with Follow-up  on 10/01/2014  HPI: DM-  Checks her FSBS daily. Have been higher- fasting is ranging now in 140s since we switched to generic rx.    Denies any hypoglycemia. Janumet- 1 tablet twice daily. On Metformin 1000 mg twice daily and Glucotrol 5 mg daily. Overdue for labs and urine micro. On statin.   Lab Results  Component Value Date   HGBA1C 7.2* 10/24/2012   HLD- on Simvastatin now. Denies myalgias. Lab Results  Component Value Date   CHOL 176 04/15/2011   HDL 36* 12/28/2013   LDLCALC 100* 12/28/2013   TRIG 177* 12/28/2013   CHOLHDL 4.8* 12/28/2013   RLS- still having issues . Feels Gabapentin 200 mg at bedtime helped a little but lately, it has been worse. Lab Results  Component Value Date   NA 142 04/04/2013   K 4.9 04/04/2013   CL 107 04/04/2013   CO2 29 04/04/2013     Current Outpatient Prescriptions on File Prior to Visit  Medication Sig Dispense Refill  . aspirin 81 MG tablet Take 81 mg by mouth daily.    . benazepril-hydrochlorthiazide (LOTENSIN HCT) 20-12.5 MG per tablet TAKE 1 TABLET BY MOUTH DAILY. 30 tablet 4  . buPROPion (WELLBUTRIN XL) 300 MG 24 hr tablet TAKE 1 TABLET BY MOUTH ONCE DAILY 90 tablet 0  . cholecalciferol (VITAMIN D) 1000 UNITS tablet Take 2,000 Units by mouth daily.     . clonazePAM (KLONOPIN) 0.5 MG tablet Take 2 tablets (1 mg total) by mouth 3 (three) times daily as needed for anxiety. 90 tablet 0  . Esomeprazole Magnesium (NEXIUM PO) Take 22.5 mg by mouth daily.    . fluocinonide-emollient (LIDEX-E) 0.05 % cream Apply 1 application topically as needed.     Marland Kitchen glipiZIDE (GLUCOTROL) 5 MG tablet TAKE 1 TABLET BY MOUTH DAILY BEFORE BREAKFAST. 30 tablet 0  . metFORMIN (GLUCOPHAGE) 1000 MG tablet Take 1 tablet (1,000 mg total) by mouth 2 (two) times  daily with a meal. 180 tablet 3  . metoprolol succinate (TOPROL XL) 25 MG 24 hr tablet Take 1 tablet (25 mg total) by mouth daily. 90 tablet 3  . Multiple Vitamins-Minerals (HM MULTIVITAMIN ADULT GUMMY PO) Take 1 tablet by mouth daily.    . simvastatin (ZOCOR) 40 MG tablet Take 1 tablet (40 mg total) by mouth at bedtime. 90 tablet 3  . traMADol (ULTRAM) 50 MG tablet TAKE 1 TABLET BY MOUTH EVERY 8 HOURS AS NEEDED (Patient taking differently: TAKE 1 TABLET BY MOUTH EVERY 8 HOURS AS NEEDED for pain) 120 tablet PRN   No current facility-administered medications on file prior to visit.    No Known Allergies  Past Medical History  Diagnosis Date  . Depression   . Diabetes mellitus   . GERD (gastroesophageal reflux disease)   . Hyperlipidemia   . Hypertension   . Heart murmur   . Endometrial ca     1998  . Heel spur   . PONV (postoperative nausea and vomiting)     after hysterectomy    Past Surgical History  Procedure Laterality Date  . Abdominal hysterectomy  1998  . Left knee arthroscopy  12/2010  . Total knee arthroplasty  12/20/2011    Procedure: TOTAL KNEE ARTHROPLASTY;  Surgeon: Gearlean Alf, MD;  Location: [facility];  Service: Orthopedics;  Laterality: Left;  Failed attempt at spinal   . Femur im nail  10/25/2012    Procedure: INTRAMEDULLARY (IM) NAIL FEMORAL;  Surgeon: Mauri Pole, MD;  Location: [facility];  Service: Orthopedics;  Laterality: Left;  . Hip surgery      Family History  Problem Relation Age of Onset  . Cancer Mother     breast cancer  . Cancer Father     plasma sarcoma    History   Social History  . Marital Status: Single    Spouse Name: N/A    Number of Children: N/A  . Years of Education: N/A   Occupational History  . 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 Smoking status: Never Smoker   . Smokeless tobacco: Never Used  . Alcohol Use: No  . Drug Use: No  . Sexual Activity: Not on file   Other Topics Concern  . 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    Review of Systems    See HPI NO CP or SOB Objective:    BP 128/70 mmHg  Pulse 69  Temp(Src) 98.1 F (36.7 C) (Oral)  Wt 207 lb (93.895 kg)  SpO2 97%   Physical Exam   General:  Well-developed,well-nourished,in no acute distress; alert,appropriate and cooperative throughout examination Head:  normocephalic and atraumatic.   Eyes:  vision grossly intact, pupils equal, pupils round, and pupils reactive to light.   Ears:  R ear normal and L ear normal.   Nose:  no external deformity.   Mouth:  good dentition.   Neck:  No deformities, masses, or tenderness noted. Lungs:  Normal respiratory effort, chest expands symmetrically. Lungs are clear to auscultation, no crackles or wheezes. Msk:  No deformity or scoliosis noted of thoracic or lumbar spine.   Extremities:  No clubbing, cyanosis, edema, or deformity noted with normal full range of motion of all joints.   Neurologic:  alert & oriented X3 and gait normal.   Skin:  Intact without suspicious lesions or rashes Psych:  Cognition and judgment appear intact. Alert and cooperative with normal attention span and concentration. No apparent delusions, illusions, hallucinations       Assessment & Plan:   Type 2 diabetes mellitus without complication - Plan: Hemoglobin A1c, Microalbumin / creatinine urine ratio  HLD (hyperlipidemia) - Plan: Comprehensive metabolic panel, Lipid panel  Essential hypertension  RLS (restless legs syndrome) No Follow-up on file.

## 2014-10-01 NOTE — Addendum Note (Signed)
Addended by: Lucille Passy on: 10/01/2014 10:49 AM   Modules accepted: Orders, Medications

## 2014-10-02 ENCOUNTER — Telehealth: Payer: Self-pay | Admitting: Family Medicine

## 2014-10-02 LAB — LIPID PANEL
CHOLESTEROL: 165 mg/dL (ref 0–200)
HDL: 26.8 mg/dL — AB (ref >39.00)
LDL Cholesterol: 100 mg/dL — ABNORMAL HIGH (ref 0–99)
NonHDL: 138.2
TRIGLYCERIDES: 189 mg/dL — AB (ref 0.0–149.0)
Total CHOL/HDL Ratio: 6
VLDL: 37.8 mg/dL (ref 0.0–40.0)

## 2014-10-02 LAB — COMPREHENSIVE METABOLIC PANEL
ALK PHOS: 134 U/L — AB (ref 39–117)
ALT: 22 U/L (ref 0–35)
AST: 27 U/L (ref 0–37)
Albumin: 4 g/dL (ref 3.5–5.2)
BUN: 28 mg/dL — AB (ref 6–23)
CALCIUM: 9.4 mg/dL (ref 8.4–10.5)
CO2: 24 mEq/L (ref 19–32)
CREATININE: 1.1 mg/dL (ref 0.4–1.2)
Chloride: 105 mEq/L (ref 96–112)
GFR: 55.17 mL/min — ABNORMAL LOW (ref >60.00)
GLUCOSE: 210 mg/dL — AB (ref 70–99)
POTASSIUM: 4.8 meq/L (ref 3.5–5.1)
Sodium: 139 mEq/L (ref 135–145)
Total Bilirubin: 0.9 mg/dL (ref 0.2–1.2)
Total Protein: 6.3 g/dL (ref 6.0–8.3)

## 2014-10-02 NOTE — Telephone Encounter (Signed)
emmi emailed °

## 2014-10-03 MED ORDER — GLIPIZIDE 5 MG PO TABS: ORAL_TABLET | ORAL | Status: DC

## 2014-10-03 NOTE — Addendum Note (Signed)
Addended by: Modena Nunnery on: 10/03/2014 02:55 PM   Modules accepted: Orders

## 2014-10-04 ENCOUNTER — Telehealth: Payer: Self-pay | Admitting: Family Medicine

## 2014-10-04 MED ORDER — GLIPIZIDE 5 MG PO TABS: ORAL_TABLET | ORAL | Status: DC

## 2014-10-04 NOTE — Telephone Encounter (Signed)
[facility] called on behalf of the patient to state that per visit on 12/1 pt was supposed to get 2 refills on her medications but per the rx that was faxed in, it states no refills and that she would need a office visit prior to refilling any meds. Pt is requesting a call back and was notified that Dr Deborra Medina is out of the office today 12/4.

## 2014-10-04 NOTE — Telephone Encounter (Signed)
Rx sent with correct refills

## 2014-10-04 NOTE — Telephone Encounter (Signed)
Spoke to pt and advised her Rx sent

## 2014-10-23 ENCOUNTER — Other Ambulatory Visit: Payer: Self-pay

## 2014-10-23 MED ORDER — CLONAZEPAM 0.5 MG PO TABS: 1.0000 mg | ORAL_TABLET | Freq: Three times a day (TID) | ORAL | Status: DC | PRN

## 2014-10-23 NOTE — Telephone Encounter (Signed)
Pt left v/m; pt is at [place] and pharmacy supposed to have requested clonazepam refill on 10/21/14 and has no response from [facility]. Spoke with pt and she has left the pharmacy. Pt states it is the 2nd time her clonazepam has not been ready for pick up when pt went to pharmacy and pt is frustrated and wants to know what is the cause of the problem. Advised pt do not see refill request in system but not sure why and told pt if she wants to call office for clonazepam refill to see if that will aid process of getting med. Pt request cb ASAP. Pt last seen 10/01/14.

## 2014-10-23 NOTE — Telephone Encounter (Signed)
Ok to refill as requested 

## 2014-10-23 NOTE — Telephone Encounter (Signed)
Rx called in to requested pharmacy 

## 2014-11-15 DIAGNOSIS — H2513 Age-related nuclear cataract, bilateral: Secondary | ICD-10-CM | POA: Diagnosis not present

## 2014-11-15 DIAGNOSIS — H5203 Hypermetropia, bilateral: Secondary | ICD-10-CM | POA: Diagnosis not present

## 2014-11-15 DIAGNOSIS — H52203 Unspecified astigmatism, bilateral: Secondary | ICD-10-CM | POA: Diagnosis not present

## 2014-11-15 DIAGNOSIS — E119 Type 2 diabetes mellitus without complications: Secondary | ICD-10-CM | POA: Diagnosis not present

## 2014-11-15 LAB — HM DIABETES EYE EXAM

## 2014-11-18 ENCOUNTER — Other Ambulatory Visit: Payer: Self-pay | Admitting: Family Medicine

## 2014-11-25 ENCOUNTER — Encounter: Payer: Self-pay | Admitting: Family Medicine

## 2014-12-04 ENCOUNTER — Other Ambulatory Visit: Payer: Self-pay | Admitting: Family Medicine

## 2014-12-04 NOTE — Telephone Encounter (Signed)
Rx called in to requested pharmacy 

## 2014-12-04 NOTE — Telephone Encounter (Signed)
Last f/u appt 10/2014

## 2015-01-02 ENCOUNTER — Ambulatory Visit (INDEPENDENT_AMBULATORY_CARE_PROVIDER_SITE_OTHER): Payer: Medicare Other | Admitting: Family Medicine

## 2015-01-02 ENCOUNTER — Encounter: Payer: Self-pay | Admitting: Family Medicine

## 2015-01-02 VITALS — BP 122/74 | HR 72 | Temp 98.0°F | Wt 205.5 lb

## 2015-01-02 DIAGNOSIS — E119 Type 2 diabetes mellitus without complications: Secondary | ICD-10-CM | POA: Diagnosis not present

## 2015-01-02 DIAGNOSIS — F411 Generalized anxiety disorder: Secondary | ICD-10-CM | POA: Diagnosis not present

## 2015-01-02 DIAGNOSIS — E785 Hyperlipidemia, unspecified: Secondary | ICD-10-CM | POA: Diagnosis not present

## 2015-01-02 DIAGNOSIS — Z79899 Other long term (current) drug therapy: Secondary | ICD-10-CM | POA: Diagnosis not present

## 2015-01-02 DIAGNOSIS — Z79891 Long term (current) use of opiate analgesic: Secondary | ICD-10-CM | POA: Diagnosis not present

## 2015-01-02 LAB — COMPREHENSIVE METABOLIC PANEL
ALT: 17 U/L (ref 0–35)
AST: 18 U/L (ref 0–37)
Albumin: 4.2 g/dL (ref 3.5–5.2)
Alkaline Phosphatase: 118 U/L — ABNORMAL HIGH (ref 39–117)
BILIRUBIN TOTAL: 0.6 mg/dL (ref 0.2–1.2)
BUN: 34 mg/dL — ABNORMAL HIGH (ref 6–23)
CALCIUM: 9.6 mg/dL (ref 8.4–10.5)
CO2: 27 mEq/L (ref 19–32)
Chloride: 107 mEq/L (ref 96–112)
Creatinine, Ser: 1.15 mg/dL (ref 0.40–1.20)
GFR: 50.18 mL/min — ABNORMAL LOW (ref >60.00)
GLUCOSE: 188 mg/dL — AB (ref 70–99)
Potassium: 5.2 mEq/L — ABNORMAL HIGH (ref 3.5–5.1)
SODIUM: 140 meq/L (ref 135–145)
Total Protein: 6.6 g/dL (ref 6.0–8.3)

## 2015-01-02 LAB — LIPID PANEL
CHOLESTEROL: 156 mg/dL (ref 0–200)
HDL: 31.1 mg/dL — ABNORMAL LOW (ref >39.00)
LDL CALC: 94 mg/dL (ref 0–99)
NonHDL: 124.9
Total CHOL/HDL Ratio: 5
Triglycerides: 157 mg/dL — ABNORMAL HIGH (ref 0.0–149.0)
VLDL: 31.4 mg/dL (ref 0.0–40.0)

## 2015-01-02 LAB — MICROALBUMIN / CREATININE URINE RATIO
Creatinine,U: 114.2 mg/dL
MICROALB UR: 2.8 mg/dL — AB (ref 0.0–1.9)
Microalb Creat Ratio: 2.5 mg/g (ref 0.0–30.0)

## 2015-01-02 LAB — HEMOGLOBIN A1C: Hgb A1c MFr Bld: 9.1 % — ABNORMAL HIGH (ref 4.6–6.5)

## 2015-01-02 MED ORDER — FREESTYLE SYSTEM KIT: 1.0000 | PACK | Status: DC | PRN

## 2015-01-02 MED ORDER — LIRAGLUTIDE 18 MG/3ML ~~LOC~~ SOPN: PEN_INJECTOR | SUBCUTANEOUS | Status: DC

## 2015-01-02 MED ORDER — CLONAZEPAM 0.5 MG PO TABS: 0.5000 mg | ORAL_TABLET | Freq: Three times a day (TID) | ORAL | Status: DC | PRN

## 2015-01-02 MED ORDER — FLUOCINONIDE-E 0.05 % EX CREA: 1.0000 "application " | TOPICAL_CREAM | CUTANEOUS | Status: DC | PRN

## 2015-01-02 NOTE — Patient Instructions (Addendum)
Good to see you. We will call you with your lab results.  We are starting Victoza.  Please read package insert before starting.

## 2015-01-02 NOTE — Progress Notes (Signed)
Subjective:   Patient ID: Brenda [name], female    DOB: Aug 20, 1951, 64 y.o.   MRN: [id number]  Brenda [name] is a pleasant 64 y.o. year old female who presents to clinic today with Follow-up and Edema  on 01/02/2015  HPI: DM-  a1c very high in December which motivated her to make lifestyle changes. Checks her FSBS daily but does not want to tell me what they are.  Has changed in diet- cut out all breads, pastas, potato containing foods, and sugars. Urine micro positive in 10/2014. Did have one episode of hypoglycemia but resolved quickly and had not eaten much that day.  On Metformin 1000 mg twice daily and Glipizide 5 mg twice daily (Glipizide increased in 10/2014). Neg eye exam 11/15/2014. Prevnar 13 10/2014. On statin.  Lab Results  Component Value Date   HGBA1C 12.2* 10/01/2014   Wt Readings from Last 3 Encounters:  01/02/15 205 lb 8 oz (93.214 kg)  10/01/14 207 lb (93.895 kg)  05/06/14 208 lb 4 oz (94.462 kg)    HLD- on Simvastatin now. Denies myalgias. Lab Results  Component Value Date   CHOL 165 10/01/2014   HDL 26.80* 10/01/2014   LDLCALC 100* 10/01/2014   TRIG 189.0* 10/01/2014   CHOLHDL 6 10/01/2014    Anxiety- has been under better control.  Does need Klonipin refills but tries to take it only when anxiety is severe.  Current Outpatient Prescriptions on File Prior to Visit  Medication Sig Dispense Refill  . aspirin 81 MG tablet Take 81 mg by mouth daily.    . benazepril-hydrochlorthiazide (LOTENSIN HCT) 20-12.5 MG per tablet TAKE 1 TABLET BY MOUTH ONCE DAILY 30 tablet 5  . buPROPion (WELLBUTRIN XL) 300 MG 24 hr tablet TAKE 1 TABLET BY MOUTH ONCE DAILY 90 tablet 0  . Calcium-Magnesium-Vitamin D (CALCIUM 500 PO) Take by mouth 2 (two) times daily.    . cholecalciferol (VITAMIN D) 1000 UNITS tablet Take 2,000 Units by mouth daily.     . Esomeprazole Magnesium (NEXIUM PO) Take 22.5 mg by mouth daily.    Marland Kitchen gabapentin (NEURONTIN) 300 MG capsule 1-2 capsules  as needed at bedtime for RLS. 90 capsule 3  . glipiZIDE (GLUCOTROL) 5 MG tablet Take one tablet twice daily 60 tablet 2  . Magnesium 400 MG CAPS Take by mouth daily.    . metFORMIN (GLUCOPHAGE) 1000 MG tablet Take 1 tablet (1,000 mg total) by mouth 2 (two) times daily with a meal. 180 tablet 3  . metoprolol succinate (TOPROL XL) 25 MG 24 hr tablet Take 1 tablet (25 mg total) by mouth daily. 90 tablet 3  . Multiple Vitamins-Minerals (HM MULTIVITAMIN ADULT GUMMY PO) Take 1 tablet by mouth daily.    . simvastatin (ZOCOR) 40 MG tablet Take 1 tablet (40 mg total) by mouth at bedtime. 90 tablet 3  . traMADol (ULTRAM) 50 MG tablet TAKE 1 TABLET BY MOUTH EVERY 8 HOURS AS NEEDED (Patient taking differently: TAKE 1 TABLET BY MOUTH EVERY 8 HOURS AS NEEDED for pain) 120 tablet PRN   No current facility-administered medications on file prior to visit.    No Known Allergies  Past Medical History  Diagnosis Date  . Depression   . Diabetes mellitus   . GERD (gastroesophageal reflux disease)   . Hyperlipidemia   . Hypertension   . Heart murmur   . Endometrial ca     1998  . Heel spur   . PONV (postoperative nausea and vomiting)  after hysterectomy    Past Surgical History  Procedure Laterality Date  . Abdominal hysterectomy  1998  . Left knee arthroscopy  12/2010  . Total knee arthroplasty  12/20/2011    Procedure: TOTAL KNEE ARTHROPLASTY;  Surgeon: Gearlean Alf, MD;  Location: [facility];  Service: Orthopedics;  Laterality: Left;  Failed attempt at spinal   . Femur im nail  10/25/2012    Procedure: INTRAMEDULLARY (IM) NAIL FEMORAL;  Surgeon: Mauri Pole, MD;  Location: [facility];  Service: Orthopedics;  Laterality: Left;  . Hip surgery      Family History  Problem Relation Age of Onset  . Cancer Mother     breast cancer  . Cancer Father     plasma sarcoma    History   Social History  . Marital Status: Single    Spouse Name: N/A  . Number of Children: N/A  . Years of Education:  N/A   Occupational History  . 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 Smoking status: Never Smoker   . Smokeless tobacco: Never Used  . Alcohol Use: No  . Drug Use: No  . Sexual Activity: Not on file   Other Topics Concern  . 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    Review of Systems  Constitutional: Negative.   HENT: Negative.   Respiratory: Negative.   Cardiovascular: Negative.   Gastrointestinal: Negative.   Endocrine: Negative.   Musculoskeletal: Negative.   Skin: Negative.   Neurological: Negative.   Psychiatric/Behavioral: Negative.      Objective:    BP 122/74 mmHg  Pulse 72  Temp(Src) 98 F (36.7 C) (Oral)  Wt 205 lb 8 oz (93.214 kg)  SpO2 97%   Physical Exam  Constitutional: She is oriented to person, place, and time. She appears well-developed and well-nourished. No distress.  HENT:  Head: Normocephalic.  Eyes: Conjunctivae are normal.  Cardiovascular: Normal rate.   Pulmonary/Chest: Effort normal.  Musculoskeletal: Normal range of motion. She exhibits no edema.  Neurological: She is alert and oriented to person, place, and time. No cranial nerve deficit.  Skin: Skin is warm and dry.  Psychiatric: She has a normal mood and affect. Her behavior is normal. Judgment normal.  Nursing note and vitals reviewed.      Assessment & Plan:   Type 2 diabetes mellitus without complication - Plan: Hemoglobin A1c, Comprehensive metabolic panel, Microalbumin / creatinine urine ratio  Generalized anxiety disorder  HLD (hyperlipidemia) - Plan: Comprehensive metabolic panel, Lipid panel No Follow-up on file.

## 2015-01-02 NOTE — Assessment & Plan Note (Signed)
Has been poorly controlled. >25 minutes spent in face to face time with patient, >50% spent in counselling or coordination of care Add Victoza 0.6 mg daily- based on a1c results today, may need to increase to 1.2 mg daily. Check labs. On ACEI On statin. Orders Placed This Encounter  Procedures  . Hemoglobin A1c  . Comprehensive metabolic panel  . Lipid panel  . Microalbumin / creatinine urine ratio

## 2015-01-02 NOTE — Progress Notes (Signed)
Pre visit review using our clinic review tool, if applicable. No additional management support is needed unless otherwise documented below in the visit note. 

## 2015-01-02 NOTE — Assessment & Plan Note (Signed)
LDL has been at goal for a diabetic. Continue Zocor at current dose.

## 2015-01-03 ENCOUNTER — Telehealth: Payer: Self-pay

## 2015-01-03 MED ORDER — FREESTYLE LANCETS MISC: Status: DC

## 2015-01-03 MED ORDER — GLUCOSE BLOOD VI STRP: ORAL_STRIP | Status: DC

## 2015-01-03 NOTE — Telephone Encounter (Signed)
Pt got freestyle meter but no test strips were included; pt request freestyle lite test strips and lancets sent to [facility]. Advised done.

## 2015-01-08 ENCOUNTER — Ambulatory Visit: Payer: 59 | Admitting: Family Medicine

## 2015-01-09 DIAGNOSIS — H25812 Combined forms of age-related cataract, left eye: Secondary | ICD-10-CM | POA: Diagnosis not present

## 2015-01-09 DIAGNOSIS — H52202 Unspecified astigmatism, left eye: Secondary | ICD-10-CM | POA: Diagnosis not present

## 2015-01-09 DIAGNOSIS — H2512 Age-related nuclear cataract, left eye: Secondary | ICD-10-CM | POA: Diagnosis not present

## 2015-01-16 ENCOUNTER — Other Ambulatory Visit: Payer: Self-pay | Admitting: Family Medicine

## 2015-01-16 ENCOUNTER — Other Ambulatory Visit: Payer: Self-pay | Admitting: Cardiovascular Disease

## 2015-01-21 ENCOUNTER — Encounter: Payer: Self-pay | Admitting: Family Medicine

## 2015-01-22 ENCOUNTER — Other Ambulatory Visit: Payer: Self-pay | Admitting: Family Medicine

## 2015-01-22 NOTE — Telephone Encounter (Signed)
Last OV 01/02/15  Medication does not match time frame. She received Wellbutrin 04/01/2014 #90 with no refills. She should have needed refills in September. Please Advise

## 2015-01-24 ENCOUNTER — Ambulatory Visit (INDEPENDENT_AMBULATORY_CARE_PROVIDER_SITE_OTHER): Payer: Medicare Other | Admitting: Cardiovascular Disease

## 2015-01-24 ENCOUNTER — Encounter: Payer: Self-pay | Admitting: Cardiovascular Disease

## 2015-01-24 VITALS — BP 110/60 | HR 77 | Ht 61.5 in | Wt 204.0 lb

## 2015-01-24 DIAGNOSIS — R000 Tachycardia, unspecified: Secondary | ICD-10-CM

## 2015-01-24 DIAGNOSIS — E669 Obesity, unspecified: Secondary | ICD-10-CM | POA: Insufficient documentation

## 2015-01-24 DIAGNOSIS — E785 Hyperlipidemia, unspecified: Secondary | ICD-10-CM

## 2015-01-24 DIAGNOSIS — I10 Essential (primary) hypertension: Secondary | ICD-10-CM

## 2015-01-24 DIAGNOSIS — E119 Type 2 diabetes mellitus without complications: Secondary | ICD-10-CM | POA: Diagnosis not present

## 2015-01-24 NOTE — Progress Notes (Signed)
Patient ID: Brenda [name], female    DOB: 1951/05/02, 64 y.o.   MRN: [id number]  HPI Comments: Brenda [name] is a very pleasant 37 -year-old woman with history of obesity, diabetes,  with previous stress test and echocardiogram several years ago for abnormal EKG, who presents for routine followup of her risk factors.  Brenda [name] reports that Brenda [name] is doing very well. Brenda [name] continues to work  at [employer].  Brenda [name] denies any significant shortness of breath or chest pain with exertion. Total cholesterol improving with better diabetes control Hemoglobin A1c 12 down to 9 Recently started on victoza.  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 aspirin 81 MG tablet Take 81 mg by mouth daily.  . benazepril-hydrochlorthiazide (LOTENSIN HCT) 20-12.5 MG per tablet TAKE 1 TABLET BY MOUTH ONCE DAILY  . buPROPion (WELLBUTRIN XL) 300 MG 24 hr tablet TAKE 1 TABLET BY MOUTH ONCE DAILY  . Calcium-Magnesium-Vitamin D (CALCIUM 500 PO) Take by mouth 2 (two) times daily.  . cholecalciferol (VITAMIN D) 1000 UNITS tablet Take 2,000 Units by mouth daily.   . clonazePAM (KLONOPIN) 0.5 MG tablet Take 1 tablet (0.5 mg total) by mouth 3 (three) times daily as needed for anxiety.  . Esomeprazole Magnesium (NEXIUM PO) Take 22.5 mg by mouth daily.  . fluocinonide-emollient (LIDEX-E) 0.05 % cream Apply 1 application topically as needed.  . gabapentin (NEURONTIN) 300 MG capsule 1-2 capsules as needed at bedtime for RLS.  Marland Kitchen glipiZIDE (GLUCOTROL) 5 MG tablet TAKE ONE TABLET TWICE DAILY  . glucose blood test strip Check blood sugar twice a day and as directed. Dx E11.9  . glucose monitoring kit (FREESTYLE) monitoring kit 1 each by Does not apply route as needed for other. Use as directed  .  Lancets (FREESTYLE) lancets Check blood sugar twice a day and as directed. Dx E11.9  . Liraglutide 18 MG/3ML SOPN 0.6 mg once daily for 1 week; may then increase to 1.2 mg once daily if tolerated  . Magnesium 400 MG CAPS Take by mouth daily.  . metFORMIN (GLUCOPHAGE) 1000 MG tablet Take 1 tablet (1,000 mg total) by mouth 2 (two) times daily with a meal.  . metoprolol succinate (TOPROL-XL) 25 MG 24 hr tablet TAKE 1 TABLET BY MOUTH DAILY.  . Multiple Vitamins-Minerals (HM MULTIVITAMIN ADULT GUMMY PO) Take 1 tablet by mouth daily.  . simvastatin (ZOCOR) 40 MG tablet Take 1 tablet (40 mg total) by mouth at bedtime.  . traMADol (ULTRAM) 50 MG tablet TAKE 1 TABLET BY MOUTH EVERY 8 HOURS AS NEEDED (Patient taking differently: TAKE 1 TABLET BY MOUTH EVERY 8 HOURS AS NEEDED for pain)  . [DISCONTINUED] buPROPion (WELLBUTRIN XL) 300 MG 24 hr tablet TAKE 1 TABLET BY MOUTH ONCE DAILY    Past Medical History  Diagnosis Date  . Depression   . Diabetes mellitus   . GERD (gastroesophageal reflux disease)   . Hyperlipidemia   . Hypertension   . Heart murmur   . Endometrial ca     1998  . Heel spur   . PONV (postoperative nausea and vomiting)     after hysterectomy    Past Surgical History  Procedure Laterality Date  . Abdominal hysterectomy  1998  . Left knee arthroscopy  12/2010  .  Total knee arthroplasty  12/20/2011    Procedure: TOTAL KNEE ARTHROPLASTY;  Surgeon: Gearlean Alf, MD;  Location: [facility];  Service: Orthopedics;  Laterality: Left;  Failed attempt at spinal   . Femur im nail  10/25/2012    Procedure: INTRAMEDULLARY (IM) NAIL FEMORAL;  Surgeon: Mauri Pole, MD;  Location: [facility];  Service: Orthopedics;  Laterality: Left;  . Hip surgery      Social History  reports that Brenda [name] has never smoked. Brenda [name] has never used smokeless tobacco. Brenda [name] reports that Brenda [name] does not drink alcohol or use illicit drugs.  Family History family history includes Cancer in her father and mother.         Review of Systems  Constitutional: Negative.   Respiratory: Negative.   Cardiovascular: Negative.   Gastrointestinal: Negative.   Musculoskeletal: Positive for joint swelling, arthralgias and gait problem.  Skin: Negative.   Neurological: Negative.   Hematological: Negative.   Psychiatric/Behavioral: Negative.   All other systems reviewed and are negative.   BP 110/60 mmHg  Pulse 77  Ht 5' 1.5" (1.562 m)  Wt 204 lb (92.534 kg)  BMI 37.93 kg/m2  Physical Exam  Constitutional: Brenda [name] is oriented to person, place, and time. Brenda [name] appears well-developed and well-nourished.  HENT:  Head: Normocephalic.  Nose: Nose normal.  Mouth/Throat: Oropharynx is clear and moist.  Eyes: Conjunctivae are normal. Pupils are equal, round, and reactive to light.  Neck: Normal range of motion. Neck supple. No JVD present.  Cardiovascular: Regular rhythm, S1 normal, S2 normal, normal heart sounds and intact distal pulses.  Tachycardia present.  Exam reveals no gallop and no friction rub.   No murmur heard. Pulmonary/Chest: Effort normal and breath sounds normal. No respiratory distress. Brenda [name] has no wheezes. Brenda [name] has no rales. Brenda [name] exhibits no tenderness.  Abdominal: Soft. Bowel sounds are normal. Brenda [name] exhibits no distension. There is no tenderness.  Musculoskeletal: Normal range of motion. Brenda [name] exhibits no edema or tenderness.  Lymphadenopathy:    Brenda [name] has no cervical adenopathy.  Neurological: Brenda [name] is alert and oriented to person, place, and time. Coordination normal.  Skin: Skin is warm and dry. No rash noted. No erythema.  Psychiatric: Brenda [name] has a normal mood and affect. Her behavior is normal. Judgment and thought content normal.    Assessment and Plan  Nursing note and vitals reviewed.

## 2015-01-24 NOTE — Assessment & Plan Note (Signed)
She denies any significant symptoms concerning for tachycardia. Symptoms are better on metoprolol 25 mg daily

## 2015-01-24 NOTE — Assessment & Plan Note (Addendum)
Recommended that she stay on her simvastatin. Numbers should improve with better diabetes control. We did spend some time talking about a coronary calcium score if she would like further risk stratification.

## 2015-01-24 NOTE — Assessment & Plan Note (Signed)
We have encouraged continued exercise, careful diet management in an effort to lose weight. 

## 2015-01-24 NOTE — Assessment & Plan Note (Signed)
We have encouraged continued exercise, careful diet management in an effort to lose weight. Recently started on Victoza

## 2015-01-24 NOTE — Assessment & Plan Note (Signed)
Blood pressure is well controlled on today's visit. No changes made to the medications. 

## 2015-01-24 NOTE — Patient Instructions (Addendum)
You are doing well. No medication changes were made.  Consider a CT coronary calcium score   Please call us if you have new issues that need to be addressed before your next appt.  Your physician wants you to follow-up in: 12 months.  You will receive a reminder letter in the mail two months in advance. If you don't receive a letter, please call our office to schedule the follow-up appointment.

## 2015-01-30 DIAGNOSIS — H268 Other specified cataract: Secondary | ICD-10-CM | POA: Diagnosis not present

## 2015-01-30 DIAGNOSIS — H25011 Cortical age-related cataract, right eye: Secondary | ICD-10-CM | POA: Diagnosis not present

## 2015-01-30 DIAGNOSIS — H52201 Unspecified astigmatism, right eye: Secondary | ICD-10-CM | POA: Diagnosis not present

## 2015-01-30 DIAGNOSIS — H25811 Combined forms of age-related cataract, right eye: Secondary | ICD-10-CM | POA: Diagnosis not present

## 2015-03-03 ENCOUNTER — Other Ambulatory Visit: Payer: Self-pay | Admitting: Cardiovascular Disease

## 2015-03-04 ENCOUNTER — Other Ambulatory Visit: Payer: Self-pay | Admitting: *Deleted

## 2015-03-04 MED ORDER — CLONAZEPAM 0.5 MG PO TABS: 0.5000 mg | ORAL_TABLET | Freq: Three times a day (TID) | ORAL | Status: DC | PRN

## 2015-03-04 NOTE — Telephone Encounter (Signed)
Last f/u appt 12/2014

## 2015-03-04 NOTE — Telephone Encounter (Signed)
Rx called in to requested pharmacy 

## 2015-03-05 ENCOUNTER — Telehealth: Payer: Self-pay

## 2015-03-05 NOTE — Telephone Encounter (Signed)
Awaiting form for completion

## 2015-03-05 NOTE — Telephone Encounter (Signed)
Pt left v/m; pt was going to pick up diabetic test strips and lancets; pt was told by [facility] that CMN would have to be filled out for medicare to pay for diabetic supplies. [facility] is faxing CMN for completion. Pt request cb.

## 2015-03-10 ENCOUNTER — Telehealth: Payer: Self-pay | Admitting: *Deleted

## 2015-03-10 NOTE — Telephone Encounter (Signed)
Spoke to pt and confirmed needed supplies. Form faxed to requested party

## 2015-03-10 NOTE — Telephone Encounter (Signed)
Pt returned call and requests c/b [phone]

## 2015-03-10 NOTE — Telephone Encounter (Signed)
Fax received indicating pt requesting DM supplies. Per Dr Deborra Medina, pt contacted to confirm which supplies are needed as the form is not pre-populated.

## 2015-04-09 ENCOUNTER — Encounter: Payer: Self-pay | Admitting: Family Medicine

## 2015-04-09 ENCOUNTER — Ambulatory Visit (INDEPENDENT_AMBULATORY_CARE_PROVIDER_SITE_OTHER): Payer: Medicare Other | Admitting: Family Medicine

## 2015-04-09 VITALS — BP 120/80 | HR 87 | Temp 97.9°F | Wt 205.0 lb

## 2015-04-09 DIAGNOSIS — F411 Generalized anxiety disorder: Secondary | ICD-10-CM

## 2015-04-09 DIAGNOSIS — E119 Type 2 diabetes mellitus without complications: Secondary | ICD-10-CM | POA: Diagnosis not present

## 2015-04-09 DIAGNOSIS — I10 Essential (primary) hypertension: Secondary | ICD-10-CM | POA: Diagnosis not present

## 2015-04-09 DIAGNOSIS — F329 Major depressive disorder, single episode, unspecified: Secondary | ICD-10-CM | POA: Diagnosis not present

## 2015-04-09 DIAGNOSIS — E785 Hyperlipidemia, unspecified: Secondary | ICD-10-CM | POA: Diagnosis not present

## 2015-04-09 DIAGNOSIS — F32A Depression, unspecified: Secondary | ICD-10-CM

## 2015-04-09 LAB — COMPREHENSIVE METABOLIC PANEL
ALT: 17 U/L (ref 0–35)
AST: 23 U/L (ref 0–37)
Albumin: 4.1 g/dL (ref 3.5–5.2)
Alkaline Phosphatase: 111 U/L (ref 39–117)
BUN: 30 mg/dL — AB (ref 6–23)
CO2: 27 mEq/L (ref 19–32)
CREATININE: 1.07 mg/dL (ref 0.40–1.20)
Calcium: 9.3 mg/dL (ref 8.4–10.5)
Chloride: 105 mEq/L (ref 96–112)
GFR: 54.48 mL/min — AB (ref >60.00)
GLUCOSE: 215 mg/dL — AB (ref 70–99)
POTASSIUM: 4.6 meq/L (ref 3.5–5.1)
Sodium: 137 mEq/L (ref 135–145)
Total Bilirubin: 0.6 mg/dL (ref 0.2–1.2)
Total Protein: 6.4 g/dL (ref 6.0–8.3)

## 2015-04-09 LAB — HEMOGLOBIN A1C: HEMOGLOBIN A1C: 8.6 % — AB (ref 4.6–6.5)

## 2015-04-09 MED ORDER — TRAMADOL HCL 50 MG PO TABS: 50.0000 mg | ORAL_TABLET | Freq: Three times a day (TID) | ORAL | Status: DC | PRN

## 2015-04-09 MED ORDER — CLONAZEPAM 0.5 MG PO TABS: 0.5000 mg | ORAL_TABLET | Freq: Three times a day (TID) | ORAL | Status: DC | PRN

## 2015-04-09 NOTE — Assessment & Plan Note (Signed)
Well controlled. Rx refilled today.

## 2015-04-09 NOTE — Assessment & Plan Note (Signed)
At goal for diabetic. No changes made. On ACEI.

## 2015-04-09 NOTE — Assessment & Plan Note (Signed)
Better control. Recheck a1c today.

## 2015-04-09 NOTE — Assessment & Plan Note (Signed)
Stable on current rxs. No changes made.

## 2015-04-09 NOTE — Progress Notes (Signed)
Subjective:   Patient ID: Brenda [name], female    DOB: [birth year], 64 y.o.   MRN: [id number]  Brenda [name] is a pleasant 64 y.o. year old female who presents to clinic today with Follow-up  on 04/09/2015  HPI: DM-  a1c very high in December which motivated her to make lifestyle changes. Checks her FSBS daily - running 130s- 160.  Has changed in diet- cut out all breads, pastas, potato containing foods, and sugars. Urine micro positive in 10/2014.  In March, we added Victoza 0.6 mg daily, continued metformin 1000 mg twice daily and Glipizide 5 mg twice daily (Glipizide increased in 10/2014).  Only one brief episode of hypoglycemia- resolved with juice.  Neg eye exam 11/15/2014. Prevnar 13 10/2014. On statin.  Lab Results  Component Value Date   HGBA1C 9.1* 01/02/2015   Wt Readings from Last 3 Encounters:  04/09/15 205 lb (92.987 kg)  01/24/15 204 lb (92.534 kg)  01/02/15 205 lb 8 oz (93.214 kg)    HLD- on Simvastatin now. Denies myalgias. Saw Dr. Rockey Situ on 3/25- note reviewed. No changes made to rxs.  Lab Results  Component Value Date   CHOL 156 01/02/2015   HDL 31.10* 01/02/2015   LDLCALC 94 01/02/2015   TRIG 157.0* 01/02/2015   CHOLHDL 5 01/02/2015    Anxiety- has been under better control.  Does need Klonipin refills but tries to take it only when anxiety is severe.  Current Outpatient Prescriptions on File Prior to Visit  Medication Sig Dispense Refill  . aspirin 81 MG tablet Take 81 mg by mouth daily.    . benazepril-hydrochlorthiazide (LOTENSIN HCT) 20-12.5 MG per tablet TAKE 1 TABLET BY MOUTH ONCE DAILY 30 tablet 5  . buPROPion (WELLBUTRIN XL) 300 MG 24 hr tablet TAKE 1 TABLET BY MOUTH ONCE DAILY 90 tablet PRN  . Calcium-Magnesium-Vitamin D (CALCIUM 500 PO) Take by mouth 2 (two) times daily.    . cholecalciferol (VITAMIN D) 1000 UNITS tablet Take 2,000 Units by mouth daily.     . Esomeprazole Magnesium (NEXIUM PO) Take 22.5 mg by mouth daily.    .  fluocinonide-emollient (LIDEX-E) 0.05 % cream Apply 1 application topically as needed. 30 g 0  . gabapentin (NEURONTIN) 300 MG capsule 1-2 capsules as needed at bedtime for RLS. 90 capsule 3  . glipiZIDE (GLUCOTROL) 5 MG tablet TAKE ONE TABLET TWICE DAILY 60 tablet 2  . glucose blood test strip Check blood sugar twice a day and as directed. Dx E11.9 100 each 3  . glucose monitoring kit (FREESTYLE) monitoring kit 1 each by Does not apply route as needed for other. Use as directed 1 each 0  . Lancets (FREESTYLE) lancets Check blood sugar twice a day and as directed. Dx E11.9 100 each 3  . Liraglutide 18 MG/3ML SOPN 0.6 mg once daily for 1 week; may then increase to 1.2 mg once daily if tolerated 6 mL 3  . Magnesium 400 MG CAPS Take by mouth daily.    . metFORMIN (GLUCOPHAGE) 1000 MG tablet Take 1 tablet (1,000 mg total) by mouth 2 (two) times daily with a meal. 180 tablet 3  . metoprolol succinate (TOPROL-XL) 25 MG 24 hr tablet TAKE 1 TABLET BY MOUTH DAILY. 90 tablet 3  . Multiple Vitamins-Minerals (HM MULTIVITAMIN ADULT GUMMY PO) Take 1 tablet by mouth daily.    . simvastatin (ZOCOR) 40 MG tablet TAKE 1 TABLET BY MOUTH AT BEDTIME. 90 tablet 3   No current facility-administered medications on  file prior to visit.    No Known Allergies  Past Medical History  Diagnosis Date  . Depression   . Diabetes mellitus   . GERD (gastroesophageal reflux disease)   . Hyperlipidemia   . Hypertension   . Heart murmur   . Endometrial ca     1998  . Heel spur   . PONV (postoperative nausea and vomiting)     after hysterectomy    Past Surgical History  Procedure Laterality Date  . Abdominal hysterectomy  1998  . Left knee arthroscopy  12/2010  . Total knee arthroplasty  12/20/2011    Procedure: TOTAL KNEE ARTHROPLASTY;  Surgeon: Gearlean Alf, MD;  Location: [facility];  Service: Orthopedics;  Laterality: Left;  Failed attempt at spinal   . Femur im nail  10/25/2012    Procedure: INTRAMEDULLARY (IM)  NAIL FEMORAL;  Surgeon: Mauri Pole, MD;  Location: [facility];  Service: Orthopedics;  Laterality: Left;  . Hip surgery      Family History  Problem Relation Age of Onset  . Cancer Mother     breast cancer  . Cancer Father     plasma sarcoma    History   Social History  . Marital Status: Single    Spouse Name: N/A  . Number of Children: N/A  . Years of Education: N/A   Occupational History  . 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 Smoking status: Never Smoker   . Smokeless tobacco: Never Used  . Alcohol Use: No  . Drug Use: No  . Sexual Activity: Not on file   Other Topics Concern  . 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    Review of Systems  Constitutional: Negative.   HENT: Negative.   Respiratory: Negative.   Cardiovascular: Negative.   Gastrointestinal: Negative.   Endocrine: Negative.   Musculoskeletal: Negative.   Skin: Negative.   Neurological: Negative.   Psychiatric/Behavioral: Negative.      Objective:    BP 120/80 mmHg  Pulse 87  Temp(Src) 97.9 F (36.6 C) (Tympanic)  Wt 205 lb (92.987 kg)  SpO2 95%   Physical Exam  Constitutional: She is oriented to person, place, and time. She appears well-developed and well-nourished. No distress.  HENT:  Head: Normocephalic.  Eyes: Conjunctivae are normal.  Cardiovascular: Normal rate.   Pulmonary/Chest: Effort normal.  Musculoskeletal: Normal range of motion. She exhibits no edema.  Neurological: She is alert and oriented to person, place, and time. No cranial nerve deficit.  Skin: Skin is warm and dry.  Psychiatric: She has a normal mood and affect. Her behavior is normal. Judgment normal.  Nursing note and vitals reviewed.      Assessment & Plan:   Type 2 diabetes mellitus without complication  HLD  (hyperlipidemia)  Depression  Generalized anxiety disorder No Follow-up on file.

## 2015-04-09 NOTE — Progress Notes (Signed)
Pre visit review using our clinic review tool, if applicable. No additional management support is needed unless otherwise documented below in the visit note. 

## 2015-04-10 ENCOUNTER — Other Ambulatory Visit: Payer: Self-pay | Admitting: Family Medicine

## 2015-04-11 ENCOUNTER — Telehealth: Payer: Self-pay

## 2015-04-11 NOTE — Telephone Encounter (Signed)
Yes I did say.  I apologize.  I do want to see her in 3 months.

## 2015-04-11 NOTE — Telephone Encounter (Signed)
Pt left v/m; pt understood after getting 04/09/15 lab report pt would be advised if pt needed f/u appt in 3 months or 6 months. Pt request cb when she should return for f/u appt.

## 2015-04-14 NOTE — Telephone Encounter (Signed)
Lm on pts vm and informed her to schedule 71mof/u

## 2015-04-22 ENCOUNTER — Other Ambulatory Visit: Payer: Self-pay | Admitting: Family Medicine

## 2015-04-28 ENCOUNTER — Other Ambulatory Visit: Payer: Self-pay | Admitting: Family Medicine

## 2015-04-30 DIAGNOSIS — Z4789 Encounter for other orthopedic aftercare: Secondary | ICD-10-CM | POA: Diagnosis not present

## 2015-04-30 DIAGNOSIS — M25552 Pain in left hip: Secondary | ICD-10-CM | POA: Diagnosis not present

## 2015-05-23 ENCOUNTER — Other Ambulatory Visit: Payer: Self-pay

## 2015-05-23 DIAGNOSIS — Z1231 Encounter for screening mammogram for malignant neoplasm of breast: Secondary | ICD-10-CM

## 2015-06-03 ENCOUNTER — Other Ambulatory Visit: Payer: Self-pay | Admitting: *Deleted

## 2015-06-03 MED ORDER — CLONAZEPAM 0.5 MG PO TABS: 0.5000 mg | ORAL_TABLET | Freq: Three times a day (TID) | ORAL | Status: DC | PRN

## 2015-06-03 NOTE — Telephone Encounter (Signed)
Last f/u appt 04/2015

## 2015-06-03 NOTE — Telephone Encounter (Signed)
Rx called in to requested pharmacy 

## 2015-06-27 ENCOUNTER — Ambulatory Visit: Payer: Medicare Other

## 2015-07-16 ENCOUNTER — Other Ambulatory Visit: Payer: Self-pay | Admitting: Family Medicine

## 2015-07-16 ENCOUNTER — Ambulatory Visit (INDEPENDENT_AMBULATORY_CARE_PROVIDER_SITE_OTHER): Payer: Medicare Other | Admitting: Family Medicine

## 2015-07-16 ENCOUNTER — Encounter: Payer: Self-pay | Admitting: Family Medicine

## 2015-07-16 VITALS — BP 110/70 | HR 77 | Temp 98.2°F | Wt 201.0 lb

## 2015-07-16 DIAGNOSIS — E1165 Type 2 diabetes mellitus with hyperglycemia: Secondary | ICD-10-CM

## 2015-07-16 DIAGNOSIS — Z1159 Encounter for screening for other viral diseases: Secondary | ICD-10-CM

## 2015-07-16 DIAGNOSIS — I10 Essential (primary) hypertension: Secondary | ICD-10-CM | POA: Diagnosis not present

## 2015-07-16 DIAGNOSIS — E785 Hyperlipidemia, unspecified: Secondary | ICD-10-CM | POA: Diagnosis not present

## 2015-07-16 DIAGNOSIS — E119 Type 2 diabetes mellitus without complications: Secondary | ICD-10-CM | POA: Diagnosis not present

## 2015-07-16 DIAGNOSIS — E669 Obesity, unspecified: Secondary | ICD-10-CM

## 2015-07-16 LAB — COMPREHENSIVE METABOLIC PANEL
ALBUMIN: 3.9 g/dL (ref 3.5–5.2)
ALT: 19 U/L (ref 0–35)
AST: 26 U/L (ref 0–37)
Alkaline Phosphatase: 121 U/L — ABNORMAL HIGH (ref 39–117)
BILIRUBIN TOTAL: 0.5 mg/dL (ref 0.2–1.2)
BUN: 25 mg/dL — ABNORMAL HIGH (ref 6–23)
CO2: 24 mEq/L (ref 19–32)
Calcium: 9.1 mg/dL (ref 8.4–10.5)
Chloride: 105 mEq/L (ref 96–112)
Creatinine, Ser: 1.16 mg/dL (ref 0.40–1.20)
GFR: 49.6 mL/min — ABNORMAL LOW (ref >60.00)
Glucose, Bld: 230 mg/dL — ABNORMAL HIGH (ref 70–99)
POTASSIUM: 5 meq/L (ref 3.5–5.1)
Sodium: 139 mEq/L (ref 135–145)
Total Protein: 6.4 g/dL (ref 6.0–8.3)

## 2015-07-16 LAB — LIPID PANEL
CHOLESTEROL: 137 mg/dL (ref 0–200)
HDL: 31.2 mg/dL — ABNORMAL LOW (ref >39.00)
LDL Cholesterol: 68 mg/dL (ref 0–99)
NonHDL: 105.59
Total CHOL/HDL Ratio: 4
Triglycerides: 188 mg/dL — ABNORMAL HIGH (ref 0.0–149.0)
VLDL: 37.6 mg/dL (ref 0.0–40.0)

## 2015-07-16 LAB — HEMOGLOBIN A1C: Hgb A1c MFr Bld: 10.8 % — ABNORMAL HIGH (ref 4.6–6.5)

## 2015-07-16 MED ORDER — GABAPENTIN 300 MG PO CAPS: ORAL_CAPSULE | ORAL | Status: DC

## 2015-07-16 MED ORDER — CLONAZEPAM 0.5 MG PO TABS: 0.5000 mg | ORAL_TABLET | Freq: Three times a day (TID) | ORAL | Status: DC | PRN

## 2015-07-16 NOTE — Progress Notes (Signed)
Subjective:   Patient ID: Brenda [name], female    DOB: 1951/01/09, 64 y.o.   MRN: [id number]  Brenda [name] is a pleasant 64 y.o. year old female who presents to clinic today with Follow-up  on 07/16/2015  HPI: DM-  a1c very high in December which motivated her to make lifestyle changes. Checks her FSBS daily - running 150s.  Improved when last checked in 04/2015 but still not at goal so we increased her victoza dose. Lab Results  Component Value Date   HGBA1C 8.6* 04/09/2015     Urine micro positive in 10/2014.  Currently taking Victoza 0.6 twice daily,metformin 1000 mg twice daily and Glipizide 5 mg twice daily (Glipizide increased in 10/2014).    Neg eye exam 11/15/2014. Prevnar 13 10/2014. On statin.  Lab Results  Component Value Date   HGBA1C 8.6* 04/09/2015   Wt Readings from Last 3 Encounters:  07/16/15 201 lb (91.173 kg)  04/09/15 205 lb (92.987 kg)  01/24/15 204 lb (92.534 kg)    HLD- on Simvastatin now. Denies myalgias. Saw Dr. Rockey Situ on 3/25- note reviewed. No changes made to rxs.  Lab Results  Component Value Date   CHOL 156 01/02/2015   HDL 31.10* 01/02/2015   LDLCALC 94 01/02/2015   TRIG 157.0* 01/02/2015   CHOLHDL 5 01/02/2015    Anxiety- has been under better control.  Does need Klonipin refills but tries to take it only when anxiety is severe.  Current Outpatient Prescriptions on File Prior to Visit  Medication Sig Dispense Refill  . aspirin 81 MG tablet Take 81 mg by mouth daily.    . benazepril-hydrochlorthiazide (LOTENSIN HCT) 20-12.5 MG per tablet TAKE 1 TABLET BY MOUTH ONCE DAILY 30 tablet 6  . buPROPion (WELLBUTRIN XL) 300 MG 24 hr tablet TAKE 1 TABLET BY MOUTH ONCE DAILY 90 tablet PRN  . Calcium-Magnesium-Vitamin D (CALCIUM 500 PO) Take by mouth 2 (two) times daily.    . cholecalciferol (VITAMIN D) 1000 UNITS tablet Take 2,000 Units by mouth daily.     . clonazePAM (KLONOPIN) 0.5 MG tablet Take 1 tablet (0.5 mg total) by mouth  3 (three) times daily as needed for anxiety. 90 tablet 0  . Esomeprazole Magnesium (NEXIUM PO) Take 22.5 mg by mouth daily.    . fluocinonide-emollient (LIDEX-E) 0.05 % cream Apply 1 application topically as needed. 30 g 0  . gabapentin (NEURONTIN) 300 MG capsule TAKE 1-2 CAPSULES BY MOUTH AS NEEDED AT BEDTIME FOR RLS. 90 capsule 3  . glipiZIDE (GLUCOTROL) 5 MG tablet TAKE 1 TABLET BY MOUTH TWICE DAILY 60 tablet 5  . glucose blood test strip Check blood sugar twice a day and as directed. Dx E11.9 100 each 3  . glucose monitoring kit (FREESTYLE) monitoring kit 1 each by Does not apply route as needed for other. Use as directed 1 each 0  . Lancets (FREESTYLE) lancets Check blood sugar twice a day and as directed. Dx E11.9 100 each 3  . Liraglutide 18 MG/3ML SOPN 0.6 mg once daily for 1 week; may then increase to 1.2 mg once daily if tolerated (Patient taking differently: 1.2 mg by In Vitro route daily. 0.6 mg once daily for 1 week; may then increase to 1.2 mg once daily if tolerated) 6 mL 3  . Magnesium 400 MG CAPS Take by mouth daily.    . metFORMIN (GLUCOPHAGE) 1000 MG tablet TAKE 1 TABLET BY MOUTH 2 TIMES DAILY WITH A MEAL. 180 tablet 2  . metoprolol  succinate (TOPROL-XL) 25 MG 24 hr tablet TAKE 1 TABLET BY MOUTH DAILY. 90 tablet 3  . Multiple Vitamins-Minerals (HM MULTIVITAMIN ADULT GUMMY PO) Take 1 tablet by mouth daily.    . simvastatin (ZOCOR) 40 MG tablet TAKE 1 TABLET BY MOUTH AT BEDTIME. 90 tablet 3  . traMADol (ULTRAM) 50 MG tablet Take 1 tablet (50 mg total) by mouth every 8 (eight) hours as needed. for pain 120 tablet 0   No current facility-administered medications on file prior to visit.    No Known Allergies  Past Medical History  Diagnosis Date  . Depression   . Diabetes mellitus   . GERD (gastroesophageal reflux disease)   . Hyperlipidemia   . Hypertension   . Heart murmur   . Endometrial ca     1998  . Heel spur   . PONV (postoperative nausea and vomiting)     after  hysterectomy    Past Surgical History  Procedure Laterality Date  . Abdominal hysterectomy  1998  . Left knee arthroscopy  12/2010  . Total knee arthroplasty  12/20/2011    Procedure: TOTAL KNEE ARTHROPLASTY;  Surgeon: Gearlean Alf, MD;  Location: [facility];  Service: Orthopedics;  Laterality: Left;  Failed attempt at spinal   . Femur im nail  10/25/2012    Procedure: INTRAMEDULLARY (IM) NAIL FEMORAL;  Surgeon: Mauri Pole, MD;  Location: [facility];  Service: Orthopedics;  Laterality: Left;  . Hip surgery      Family History  Problem Relation Age of Onset  . Cancer Mother     breast cancer  . Cancer Father     plasma sarcoma    Social History   Social History  . Marital Status: Single    Spouse Name: N/A  . Number of Children: N/A  . Years of Education: N/A   Occupational History  . 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 Smoking status: Never Smoker   . Smokeless tobacco: Never Used  . Alcohol Use: No  . Drug Use: No  . Sexual Activity: Not on file   Other Topics Concern  . 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    Review of Systems  Constitutional: Negative.   HENT: Negative.   Respiratory: Negative.   Cardiovascular: Negative.   Gastrointestinal: Negative.   Endocrine: Negative.   Musculoskeletal: Negative.   Skin: Negative.   Neurological: Negative.   Psychiatric/Behavioral: Negative.      Objective:    BP 110/70 mmHg  Pulse 77  Temp(Src) 98.2 F (36.8 C) (Tympanic)  Wt 201 lb (91.173 kg)  SpO2 95%   Physical Exam  Constitutional: She is oriented to person, place, and time. She appears well-developed and well-nourished. No distress.  HENT:  Head: Normocephalic.  Eyes: Conjunctivae are normal.  Cardiovascular: Normal rate.   Pulmonary/Chest: Effort normal.   Musculoskeletal: Normal range of motion. She exhibits no edema.  Neurological: She is alert and oriented to person, place, and time. No cranial nerve deficit.  Skin: Skin is warm and dry.  Psychiatric: She has a normal mood and affect. Her behavior is normal. Judgment normal.  Nursing note and vitals reviewed.      Assessment & Plan:   HLD (  hyperlipidemia) - Plan: Comprehensive metabolic panel, Lipid panel  Type 2 diabetes mellitus without complication - Plan: Hemoglobin A1c  Essential hypertension  Need for hepatitis C screening test - Plan: Hepatitis C Antibody No Follow-up on file.

## 2015-07-16 NOTE — Assessment & Plan Note (Signed)
Continue simvastatin at current dose. LDL at goal for a diabetic.

## 2015-07-16 NOTE — Progress Notes (Signed)
Pre visit review using our clinic review tool, if applicable. No additional management support is needed unless otherwise documented below in the visit note. 

## 2015-07-16 NOTE — Patient Instructions (Signed)
Good to see you. I will call you with your lab results.  Please come see me in 3 months.

## 2015-07-16 NOTE — Assessment & Plan Note (Signed)
She is working on diet- has lost 4 pounds since last OV. Encouraged her to continue increased physical activity at work and cut back on portions. The patient indicates understanding of these issues and agrees with the plan.

## 2015-07-16 NOTE — Assessment & Plan Note (Signed)
Due for a1c today. No changes made to rx- await lab results. The patient indicates understanding of these issues and agrees with the plan. Orders Placed This Encounter  Procedures  . Hemoglobin A1c  . Comprehensive metabolic panel  . Lipid panel  . Hepatitis C Antibody

## 2015-07-16 NOTE — Assessment & Plan Note (Signed)
Well controlled on current rx. No changes made. 

## 2015-07-17 LAB — HEPATITIS C ANTIBODY: HCV Ab: NEGATIVE

## 2015-07-28 ENCOUNTER — Ambulatory Visit
Admission: RE | Admit: 2015-07-28 | Discharge: 2015-07-28 | Disposition: A | Discharge status: Home / Self Care | Payer: Medicare Other | Source: Ambulatory Visit

## 2015-07-28 DIAGNOSIS — Z1231 Encounter for screening mammogram for malignant neoplasm of breast: Secondary | ICD-10-CM | POA: Diagnosis not present

## 2015-07-29 ENCOUNTER — Other Ambulatory Visit: Payer: Self-pay | Admitting: Family Medicine

## 2015-08-14 ENCOUNTER — Encounter: Payer: Self-pay | Admitting: Internal Medicine

## 2015-08-14 ENCOUNTER — Ambulatory Visit (INDEPENDENT_AMBULATORY_CARE_PROVIDER_SITE_OTHER): Payer: Medicare Other | Admitting: Internal Medicine

## 2015-08-14 VITALS — BP 120/80 | HR 92 | Temp 97.7°F | Resp 12 | Ht 61.0 in | Wt 198.4 lb

## 2015-08-14 DIAGNOSIS — E1165 Type 2 diabetes mellitus with hyperglycemia: Secondary | ICD-10-CM | POA: Diagnosis not present

## 2015-08-14 NOTE — Progress Notes (Signed)
Patient ID: Brenda [name], female   DOB: 1951-06-06, 64 y.o.   MRN: [id number]  HPI: Brenda [name] is a 64 y.o.-year-old female, referred by her PCP, Dr. Deborra Medina, for management of DM2, dx in 2007, non-insulin-dependent, uncontrolled, with complications (mild CKD).  Last hemoglobin A1c was: Lab Results  Component Value Date   HGBA1C 10.8* 07/16/2015   HGBA1C 8.6* 04/09/2015   HGBA1C 9.1* 01/02/2015   Pt is on a regimen of: - Metformin 1000 mg 2x a day, with meals - Glipizide 5 mg 2x a day, before meals - Victoza (started 12/2014), increased from 0.6 mg to 1.2 mg daily in 04/2015. A little nausea, but not bothersome.   Pt checks her sugars 3 a day and they are MUCH improved: - am: 55-153 - 2h after b'fast: 114 - before lunch: 49 x1, 53-136 - 2h after lunch: 71 - before dinner: 77-143 - 2h after dinner: n/c - bedtime: n/c - nighttime: n/c No lows. Lowest sugar was 49, 50s; she has hypoglycemia awareness at 70.  Highest sugar was 177.  Glucometer: Freestyle Lite  Pt's meals are much improved recent: - Breakfast: English muffin + preserve or cereal or yoghurt - snack: fruit or fruit + yoghurt - Lunch: 1/2 sandwich + chips + salsa + fruit/sugar free - Dinner: soup or chicken/steak + veggies, occasional starch or Lean cuisine or light salad  - + mild CKD, last BUN/creatinine:  Lab Results  Component Value Date   BUN 25* 07/16/2015   CREATININE 1.16 07/16/2015  On Benazepril. - last set of lipids: Lab Results  Component Value Date   CHOL 137 07/16/2015   HDL 31.20* 07/16/2015   LDLCALC 68 07/16/2015   TRIG 188.0* 07/16/2015   CHOLHDL 4 07/16/2015  On Simvastatin. - last eye exam was on 11/15/2014. No DR. Cataract sx in 12/2014. - no numbness and tingling in her feet. On Gabapentin for leg cramps at night. On ASA 81.  Pt has FH of DM in mother and brother.  She has a h/o L TKR and then had a fx of the L hip >> pain in L leg with exercise.   She also has HTN and  HL, GERD, depression.  ROS: Constitutional: no weight gain/loss, no fatigue, no subjective hyperthermia/hypothermia, + poor sleep Eyes: no blurry vision, no xerophthalmia ENT: no sore throat, no nodules palpated in throat, no dysphagia/odynophagia, no hoarseness, + tinnitus Cardiovascular: no CP/SOB/palpitations/leg swelling Respiratory: no cough/SOB Gastrointestinal: no N/V/D/C/+ heartburn Musculoskeletal: no muscle/+ joint aches Skin: no rashes Neurological: no tremors/numbness/tingling/dizziness Psychiatric: no depression/anxiety  Past Medical History  Diagnosis Date  . Depression   . Diabetes mellitus   . GERD (gastroesophageal reflux disease)   . Hyperlipidemia   . Hypertension   . Heart murmur   . Endometrial ca     1998  . Heel spur   . PONV (postoperative nausea and vomiting)     after hysterectomy   Past Surgical History  Procedure Laterality Date  . Abdominal hysterectomy  1998  . Left knee arthroscopy  12/2010  . Total knee arthroplasty  12/20/2011    Procedure: TOTAL KNEE ARTHROPLASTY;  Surgeon: Gearlean Alf, MD;  Location: [facility];  Service: Orthopedics;  Laterality: Left;  Failed attempt at spinal   . Femur im nail  10/25/2012    Procedure: INTRAMEDULLARY (IM) NAIL FEMORAL;  Surgeon: Mauri Pole, MD;  Location: [facility];  Service: Orthopedics;  Laterality: Left;  . Hip surgery     Social History  Social History  . Marital Status: Single    Spouse Name: N/A  . Number of Children: 0   Occupational History  . 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 Smoking status: Never Smoker   . Smokeless tobacco: Never Used  . Alcohol Use: No  . 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 aspirin 81 MG tablet Take 81 mg by mouth daily.    . benazepril-hydrochlorthiazide (LOTENSIN HCT) 20-12.5 MG per tablet TAKE 1 TABLET BY MOUTH ONCE  DAILY 30 tablet 6  . buPROPion (WELLBUTRIN XL) 300 MG 24 hr tablet TAKE 1 TABLET BY MOUTH ONCE DAILY 90 tablet PRN  . Calcium-Magnesium-Vitamin D (CALCIUM 500 PO) Take by mouth 2 (two) times daily.    . cholecalciferol (VITAMIN D) 1000 UNITS tablet Take 2,000 Units by mouth daily.     . clonazePAM (KLONOPIN) 0.5 MG tablet Take 1 tablet (0.5 mg total) by mouth 3 (three) times daily as needed for anxiety. 90 tablet 0  . Esomeprazole Magnesium (NEXIUM PO) Take 22.5 mg by mouth daily.    . fluocinonide-emollient (LIDEX-E) 0.05 % cream Apply 1 application topically as needed. 30 g 0  . gabapentin (NEURONTIN) 300 MG capsule TAKE 1-2 CAPSULES BY MOUTH AS NEEDED AT BEDTIME FOR RLS. 90 capsule 3  . glipiZIDE (GLUCOTROL) 5 MG tablet TAKE 1 TABLET BY MOUTH TWICE DAILY 60 tablet 5  . glucose blood test strip Check blood sugar twice a day and as directed. Dx E11.9 100 each 3  . glucose monitoring kit (FREESTYLE) monitoring kit 1 each by Does not apply route as needed for other. Use as directed 1 each 0  . Lancets (FREESTYLE) lancets Check blood sugar twice a day and as directed. Dx E11.9 100 each 3  . Magnesium 400 MG CAPS Take by mouth daily.    . metFORMIN (GLUCOPHAGE) 1000 MG tablet TAKE 1 TABLET BY MOUTH 2 TIMES DAILY WITH A MEAL. 180 tablet 2  . metoprolol succinate (TOPROL-XL) 25 MG 24 hr tablet TAKE 1 TABLET BY MOUTH DAILY. 90 tablet 3  . Multiple Vitamins-Minerals (HM MULTIVITAMIN ADULT GUMMY PO) Take 1 tablet by mouth daily.    . simvastatin (ZOCOR) 40 MG tablet TAKE 1 TABLET BY MOUTH AT BEDTIME. 90 tablet 3  . traMADol (ULTRAM) 50 MG tablet Take 1 tablet (50 mg total) by mouth every 8 (eight) hours as needed. for pain 120 tablet 0  . VICTOZA 18 MG/3ML SOPN INJECT 0.6 MG ONCE DAILY FOR 1 WEEK, MAY THEN INCREASE TO 1.2 MG ONCE WEEKLY IF TOLERATED 6 mL 3   No current facility-administered medications on file prior to visit.   No Known Allergies Family History  Problem Relation Age of Onset  .  Cancer Mother     breast cancer  . Cancer Father     plasma sarcoma   PE: BP 120/80 mmHg  Pulse 92  Temp(Src) 97.7 F (36.5 C) (Oral)  Resp 12  Ht 5' 1"  (1.549 m)  Wt 198 lb 6.4 oz (89.994 kg)  BMI 37.51 kg/m2  SpO2 97% Wt Readings from Last 3 Encounters:  08/14/15 198 lb 6.4 oz (89.994 kg)  07/16/15 201 lb (91.173 kg)  04/09/15 205 lb (92.987 kg)   Constitutional: overweight, truncal obesity, in NAD Eyes: PERRLA, EOMI, no exophthalmos ENT: moist mucous membranes, no  thyromegaly, no cervical lymphadenopathy Cardiovascular: tachycardia, RR, No MRG Respiratory: CTA B Gastrointestinal: abdomen soft, NT, ND, BS+ Musculoskeletal: no deformities, strength intact in all 4 Skin: moist, warm, no rashes Neurological: no tremor with outstretched hands, DTR normal in all 4  ASSESSMENT: 1. DM2, non-insulin-dependent, uncontrolled, with complications - mild CKD  PLAN:  1. Patient with long-standing, uncontrolled diabetes, on oral antidiabetic regimen, which became insufficient - last HbA1c 1 mo ago: 10.8%. Since then, she changed her eating habits and her sugars improved drastically, to the point of lows. I congratulated her for her diet change, gaining control of her DM and losing weight. Will stop Glipizide to avoid lows, especially as she plans to start water aerobics. We can increase Victoza if needed in the future.  - I suggested to:  Patient Instructions  Please continue: - Metformin 1000 mg 2x a day - Victoza 1.2 mg daily in am  Please stop Glipizide.  If the sugars start to increase >> please increase Victoza dose to 1.8 mg daily.  Please return in 2 months with your sugar log.   - continue checking sugars at different times of the day - check 2 times a day, rotating checks - given sugar log and advised how to fill it and to bring it at next appt  - given foot care handout and explained the principles  - given instructions for hypoglycemia management "15-15 rule"  -  advised for yearly eye exams. She is UTD. - Return to clinic in 2 mo with sugar log

## 2015-08-14 NOTE — Patient Instructions (Addendum)
Please continue: - Metformin 1000 mg 2x a day - Victoza 1.2 mg daily in am  Please stop Glipizide.  If the sugars start to increase >> please increase Victoza dose to 1.8 mg daily.  Please return in 2 months with your sugar log.   PATIENT INSTRUCTIONS FOR TYPE 2 DIABETES:  DIET AND EXERCISE Diet and exercise is an important part of diabetic treatment.  We recommended aerobic exercise in the form of brisk walking (working between 40-60% of maximal aerobic capacity, similar to brisk walking) for 150 minutes per week (such as 30 minutes five days per week) along with 3 times per week performing 'resistance' training (using various gauge rubber tubes with handles) 5-10 exercises involving the major muscle groups (upper body, lower body and core) performing 10-15 repetitions (or near fatigue) each exercise. Start at half the above goal but build slowly to reach the above goals. If limited by weight, joint pain, or disability, we recommend daily walking in a swimming pool with water up to waist to reduce pressure from joints while allow for adequate exercise.    BLOOD GLUCOSES Monitoring your blood glucoses is important for continued management of your diabetes. Please check your blood glucoses 2-4 times a day: fasting, before meals and at bedtime (you can rotate these measurements - e.g. one day check before the 3 meals, the next day check before 2 of the meals and before bedtime, etc.).   HYPOGLYCEMIA (low blood sugar) Hypoglycemia is usually a reaction to not eating, exercising, or taking too much insulin/ other diabetes drugs.  Symptoms include tremors, sweating, hunger, confusion, headache, etc. Treat IMMEDIATELY with 15 grams of Carbs: . 4 glucose tablets .  cup regular juice/soda . 2 tablespoons raisins . 4 teaspoons sugar . 1 tablespoon honey Recheck blood glucose in 15 mins and repeat above if still symptomatic/blood glucose <100.  RECOMMENDATIONS TO REDUCE YOUR RISK OF DIABETIC  COMPLICATIONS: * Take your prescribed MEDICATION(S) * Follow a DIABETIC diet: Complex carbs, fiber rich foods, (monounsaturated and polyunsaturated) fats * AVOID saturated/trans fats, high fat foods, >2,300 mg salt per day. * EXERCISE at least 5 times a week for 30 minutes or preferably daily.  * DO NOT SMOKE OR DRINK more than 1 drink a day. * Check your FEET every day. Do not wear tightfitting shoes. Contact us if you develop an ulcer * See your EYE doctor once a year or more if needed * Get a FLU shot once a year * Get a PNEUMONIA vaccine once before and once after age 76 years  GOALS:  * Your Hemoglobin A1c of <7%  * fasting sugars need to be <130 * after meals sugars need to be <180 (2h after you start eating) * Your Systolic BP should be 048 or lower  * Your Diastolic BP should be 80 or lower  * Your HDL (Good Cholesterol) should be 40 or higher  * Your LDL (Bad Cholesterol) should be 100 or lower. * Your Triglycerides should be 150 or lower  * Your Urine microalbumin (kidney function) should be <30 * Your Body Mass Index should be 25 or lower    Please consider the following ways to cut down carbs and fat and increase fiber and micronutrients in your diet: - substitute whole grain for white bread or pasta - substitute brown rice for white rice - substitute 90-calorie flat bread pieces for slices of bread when possible - substitute sweet potatoes or yams for white potatoes - substitute humus for margarine -  substitute tofu for cheese when possible - substitute almond or rice milk for regular milk (would not drink soy milk daily due to concern for soy estrogen influence on breast cancer risk) - substitute dark chocolate for other sweets when possible - substitute water - can add lemon or orange slices for taste - for diet sodas (artificial sweeteners will trick your body that you can eat sweets without getting calories and will lead you to overeating and weight gain in the long  run) - do not skip breakfast or other meals (this will slow down the metabolism and will result in more weight gain over time)  - can try smoothies made from fruit and almond/rice milk in am instead of regular breakfast - can also try old-fashioned (not instant) oatmeal made with almond/rice milk in am - order the dressing on the side when eating salad at a restaurant (pour less than half of the dressing on the salad) - eat as little meat as possible - can try juicing, but should not forget that juicing will get rid of the fiber, so would alternate with eating raw veg./fruits or drinking smoothies - use as little oil as possible, even when using olive oil - can dress a salad with a mix of balsamic vinegar and lemon juice, for e.g. - use agave nectar, stevia sugar, or regular sugar rather than artificial sweateners - steam or broil/roast veggies  - snack on veggies/fruit/nuts (unsalted, preferably) when possible, rather than processed foods - reduce or eliminate aspartame in diet (it is in diet sodas, chewing gum, etc) Read the labels!  Try to read Dr. Janene Harvey book: "Program for Reversing Diabetes" for other ideas for healthy eating.

## 2015-09-02 ENCOUNTER — Other Ambulatory Visit: Payer: Self-pay | Admitting: *Deleted

## 2015-09-02 MED ORDER — CLONAZEPAM 0.5 MG PO TABS: 0.5000 mg | ORAL_TABLET | Freq: Three times a day (TID) | ORAL | Status: DC | PRN

## 2015-09-02 NOTE — Telephone Encounter (Signed)
Rx called in to requested pharmacy 

## 2015-09-02 NOTE — Telephone Encounter (Signed)
Last f/u appt 07/2015

## 2015-09-04 ENCOUNTER — Other Ambulatory Visit: Payer: Self-pay | Admitting: Family Medicine

## 2015-10-15 ENCOUNTER — Encounter: Payer: Self-pay | Admitting: Family Medicine

## 2015-10-15 ENCOUNTER — Ambulatory Visit: Payer: Medicare Other | Admitting: Family Medicine

## 2015-10-15 ENCOUNTER — Other Ambulatory Visit (INDEPENDENT_AMBULATORY_CARE_PROVIDER_SITE_OTHER): Payer: Medicare Other | Admitting: *Deleted

## 2015-10-15 ENCOUNTER — Ambulatory Visit (INDEPENDENT_AMBULATORY_CARE_PROVIDER_SITE_OTHER): Payer: Medicare Other | Admitting: Internal Medicine

## 2015-10-15 ENCOUNTER — Ambulatory Visit (INDEPENDENT_AMBULATORY_CARE_PROVIDER_SITE_OTHER): Payer: Medicare Other | Admitting: Family Medicine

## 2015-10-15 ENCOUNTER — Encounter: Payer: Self-pay | Admitting: Internal Medicine

## 2015-10-15 VITALS — BP 114/70 | HR 72 | Temp 97.6°F | Resp 12 | Wt 196.8 lb

## 2015-10-15 VITALS — BP 146/82 | HR 91 | Temp 97.9°F | Wt 198.0 lb

## 2015-10-15 DIAGNOSIS — E1165 Type 2 diabetes mellitus with hyperglycemia: Secondary | ICD-10-CM

## 2015-10-15 DIAGNOSIS — I10 Essential (primary) hypertension: Secondary | ICD-10-CM | POA: Diagnosis not present

## 2015-10-15 DIAGNOSIS — E785 Hyperlipidemia, unspecified: Secondary | ICD-10-CM | POA: Diagnosis not present

## 2015-10-15 DIAGNOSIS — E1122 Type 2 diabetes mellitus with diabetic chronic kidney disease: Secondary | ICD-10-CM | POA: Insufficient documentation

## 2015-10-15 DIAGNOSIS — E118 Type 2 diabetes mellitus with unspecified complications: Secondary | ICD-10-CM | POA: Insufficient documentation

## 2015-10-15 LAB — POCT GLYCOSYLATED HEMOGLOBIN (HGB A1C): Hemoglobin A1C: 6.7

## 2015-10-15 MED ORDER — FLUOCINONIDE-E 0.05 % EX CREA: 1.0000 "application " | TOPICAL_CREAM | CUTANEOUS | Status: DC | PRN

## 2015-10-15 MED ORDER — CLONAZEPAM 0.5 MG PO TABS: 0.5000 mg | ORAL_TABLET | Freq: Three times a day (TID) | ORAL | Status: DC | PRN

## 2015-10-15 MED ORDER — LIRAGLUTIDE 18 MG/3ML ~~LOC~~ SOPN: 1.2000 mg | PEN_INJECTOR | Freq: Every day | SUBCUTANEOUS | Status: DC

## 2015-10-15 NOTE — Assessment & Plan Note (Signed)
At goal for a diabetic. No changes made.

## 2015-10-15 NOTE — Progress Notes (Signed)
Patient ID: Brenda [name], female   DOB: [birth year], 64 y.o.   MRN: [id number]  HPI: Brenda [name] is a 64 y.o.-year-old female, returning for follow-up for DM2, dx in 2007, non-insulin-dependent, uncontrolled, with complications (mild CKD). Last visit 2 months ago.  Last hemoglobin A1c was: Lab Results  Component Value Date   HGBA1C 10.8* 07/16/2015   HGBA1C 8.6* 04/09/2015   HGBA1C 9.1* 01/02/2015   Pt is on a regimen of: - Metformin 1000 mg 2x a day, with meals - Victoza (started 12/2014), increased from 0.6 mg to 1.2 mg daily in 04/2015. A little nausea, but not bothersome.  At last visit, we stopped Glipizide 5 mg 2x a day, before meals, because of lows.  Pt checks her sugars 3 a day and they are: - am: 55-153 >> 60, 79, 91-140 - 2h after b'fast: 114 >> 105, 129 - before lunch: 49 x1, 53-136 >> 51, 60-116 - 2h after lunch: 71 >> 64-138 >> 64, 81-138 - before dinner: 77-143 >> 86, 74-138, 157, 165 - 2h after dinner: n/c >> 147 - bedtime: n/c >> 89-158 - nighttime: n/c No lows. Lowest sugar was 49, 50s >> 60; she has hypoglycemia awareness at 70.  Highest sugar was 177 >> 165.  Glucometer: Freestyle Lite  Pt's meals are much improved after the last hemoglobin A1c: - Breakfast: English muffin + preserve or cereal or yoghurt - snack: fruit or fruit + yoghurt - Lunch: 1/2 sandwich + chips + salsa + fruit/sugar free - Dinner: soup or chicken/steak + veggies, occasional starch or Lean cuisine or light salad  - + mild CKD, last BUN/creatinine:  Lab Results  Component Value Date   BUN 25* 07/16/2015   CREATININE 1.16 07/16/2015  On Benazepril. - last set of lipids: Lab Results  Component Value Date   CHOL 137 07/16/2015   HDL 31.20* 07/16/2015   LDLCALC 68 07/16/2015   TRIG 188.0* 07/16/2015   CHOLHDL 4 07/16/2015  On Simvastatin. - last eye exam was on 11/15/2014. No DR. Cataract sx in 12/2014. - no numbness and tingling in her feet. On Gabapentin for leg  cramps at night. On ASA 81.  She has a h/o L TKR and then had a fx of the L hip >> pain in L leg with exercise.   She also has HTN and HL, GERD, depression.  ROS: Constitutional: no weight gain/loss, no fatigue, no subjective hyperthermia/hypothermia, + poor sleep Eyes: no blurry vision, no xerophthalmia ENT: no sore throat, no nodules palpated in throat, no dysphagia/odynophagia, no hoarseness, + tinnitus Cardiovascular: no CP/SOB/palpitations/leg swelling Respiratory: no cough/SOB Gastrointestinal: no N/V/D/C/+ heartburn Musculoskeletal: no muscle/+ joint aches Skin: no rashes Neurological: no tremors/numbness/tingling/dizziness  I reviewed pt's medications, allergies, PMH, social hx, family hx, and changes were documented in the history of present illness. Otherwise, unchanged from my initial visit note.  Past Medical History  Diagnosis Date  . Depression   . Diabetes mellitus   . GERD (gastroesophageal reflux disease)   . Hyperlipidemia   . Hypertension   . Heart murmur   . Endometrial ca ([place])     1998  . Heel spur   . PONV (postoperative nausea and vomiting)     after hysterectomy   Past Surgical History  Procedure Laterality Date  . Abdominal hysterectomy  1998  . Left knee arthroscopy  12/2010  . Total knee arthroplasty  12/20/2011    Procedure: TOTAL KNEE ARTHROPLASTY;  Surgeon: Gearlean Alf, MD;  Location: [facility];  Service: Orthopedics;  Laterality: Left;  Failed attempt at spinal   . Femur im nail  10/25/2012    Procedure: INTRAMEDULLARY (IM) NAIL FEMORAL;  Surgeon: Mauri Pole, MD;  Location: [facility];  Service: Orthopedics;  Laterality: Left;  . Hip surgery     Social History   Social History  . Marital Status: Single    Spouse Name: N/A  . Number of Children: 0   Occupational History  . 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 Smoking status: Never Smoker   . Smokeless tobacco: Never Used  . Alcohol Use: No  . 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 aspirin 81 MG tablet Take 81 mg by mouth daily.    . benazepril-hydrochlorthiazide (LOTENSIN HCT) 20-12.5 MG per tablet TAKE 1 TABLET BY MOUTH ONCE DAILY 30 tablet 6  . buPROPion (WELLBUTRIN XL) 300 MG 24 hr tablet TAKE 1 TABLET BY MOUTH ONCE DAILY 90 tablet PRN  . Calcium-Magnesium-Vitamin D (CALCIUM 500 PO) Take by mouth 2 (two) times daily.    . cholecalciferol (VITAMIN D) 1000 UNITS tablet Take 2,000 Units by mouth daily.     . clonazePAM (KLONOPIN) 0.5 MG tablet Take 1 tablet (0.5 mg total) by mouth 3 (three) times daily as needed for anxiety. 90 tablet 0  . Esomeprazole Magnesium (NEXIUM PO) Take 22.5 mg by mouth daily.    . fluocinonide-emollient (LIDEX-E) 0.05 % cream Apply 1 application topically as needed. 30 g 0  . FREESTYLE LITE test strip USE TO CHECK BLOOD SUGAR TWICE DAILY AND AS DIRECTED 100 each 1  . gabapentin (NEURONTIN) 300 MG capsule TAKE 1-2 CAPSULES BY MOUTH AS NEEDED AT BEDTIME FOR RLS. 90 capsule 3  . glucose monitoring kit (FREESTYLE) monitoring kit 1 each by Does not apply route as needed for other. Use as directed 1 each 0  . Lancets (FREESTYLE) lancets USE TO CHECK BLOOD SUGAR TWICE DAILY AND AS DIRECTED 100 each 1  . Magnesium 400 MG CAPS Take by mouth daily.    . metFORMIN (GLUCOPHAGE) 1000 MG tablet TAKE 1 TABLET BY MOUTH 2 TIMES DAILY WITH A MEAL. 180 tablet 2  . metoprolol succinate (TOPROL-XL) 25 MG 24 hr tablet TAKE 1 TABLET BY MOUTH DAILY. 90 tablet 3  . Multiple Vitamins-Minerals (HM MULTIVITAMIN ADULT GUMMY PO) Take 1 tablet by mouth daily.    . simvastatin (ZOCOR) 40 MG tablet TAKE 1 TABLET BY MOUTH AT BEDTIME. 90 tablet 3  . traMADol (ULTRAM) 50 MG tablet Take 1 tablet (50 mg total) by mouth every 8 (eight) hours as needed. for pain 120 tablet 0  . VICTOZA 18 MG/3ML SOPN INJECT 0.6 MG ONCE DAILY FOR 1 WEEK, MAY  THEN INCREASE TO 1.2 MG ONCE WEEKLY IF TOLERATED 6 mL 3   No current facility-administered medications on file prior to visit.   No Known Allergies Family History  Problem Relation Age of Onset  . Cancer Mother     breast cancer  . Cancer Father     plasma sarcoma   PE: BP 114/70 mmHg  Pulse 72  Temp(Src) 97.6 F (36.4 C) (Oral)  Resp 12  Wt 196 lb 12.8 oz (89.268 kg)  SpO2 97% Body mass index is 37.2 kg/(m^2). Wt Readings from Last 3 Encounters:  10/15/15 196 lb 12.8 oz (89.268  kg)  08/14/15 198 lb 6.4 oz (89.994 kg)  07/16/15 201 lb (91.173 kg)   Constitutional: overweight, truncal obesity, in NAD Eyes: PERRLA, EOMI, no exophthalmos ENT: moist mucous membranes, no thyromegaly, no cervical lymphadenopathy Cardiovascular: tachycardia, RR, No MRG Respiratory: CTA B Gastrointestinal: abdomen soft, NT, ND, BS+ Musculoskeletal: no deformities, strength intact in all 4 Skin: moist, warm, no rashes Neurological: no tremor with outstretched hands, DTR normal in all 4  ASSESSMENT: 1. DM2, non-insulin-dependent, uncontrolled, with complications - mild CKD  PLAN:  1. Patient with long-standing, uncontrolled diabetes, on metformin and Victoza, with significant improvement in her blood sugars after she improved her diet before last visit. She even developed low CBGs, therefore, we had to stop her glipizide. We reviewed together her last HbA1c obtained 3 mo ago: 10.8%. We checked another hemoglobin A1c today >> 6.7% (impressive decrease!).  - I suggested to:  Patient Instructions  Please continue: - Metformin 1000 mg 2x a day - Victoza 1.2 mg daily in am  If the sugars start to increase >> please increase Victoza dose to 1.5 or 1.8 mg daily.  Please return in 3 months with your sugar log.   - continue checking sugars at different times of the day - check 1-2 times a day, rotating checks - given more sugar logs  - advised for yearly eye exams. She is UTD. - UTD with flu  shot - Return to clinic in 3 mo with sugar log

## 2015-10-15 NOTE — Assessment & Plan Note (Signed)
Deteriorated but rushed to get here. Normotensive this morning at endocrinology office. No changes made to rxs.

## 2015-10-15 NOTE — Assessment & Plan Note (Signed)
Better control than it has been in years! Congratulated her on her success. Follow up yearly with me since she is now being followed by endo

## 2015-10-15 NOTE — Progress Notes (Signed)
Subjective:   Patient ID: Brenda [name], female    DOB: November 28, 1950, 64 y.o.   MRN: [id number]  Brenda [name] is a pleasant 64 y.o. year old female who presents to clinic today with Follow-up  on 10/15/2015  HPI: DM-  Now followed by Dr. Cruzita Lederer.  Was last seen earlier today. Note reviewed- a1c is much improved!  NO changes made to rxs. Lab Results  Component Value Date   HGBA1C 6.7 10/15/2015    Currently taking Victoza 0.6 twice daily,metformin 1000 mg twice daily and Glipizide 5 mg twice daily (Glipizide increased in 10/2014).    Neg eye exam 11/15/2014. Prevnar 13 10/2014. On statin.  Lab Results  Component Value Date   HGBA1C 6.7 10/15/2015   Wt Readings from Last 3 Encounters:  10/15/15 198 lb (89.812 kg)  10/15/15 196 lb 12.8 oz (89.268 kg)  08/14/15 198 lb 6.4 oz (89.994 kg)    HLD- on Simvastatin now. Denies myalgias.  Lab Results  Component Value Date   CHOL 137 07/16/2015   HDL 31.20* 07/16/2015   LDLCALC 68 07/16/2015   TRIG 188.0* 07/16/2015   CHOLHDL 4 07/16/2015    Anxiety- has been under better control.  Does need Klonipin refills but tries to take it only when anxiety is severe.  Current Outpatient Prescriptions on File Prior to Visit  Medication Sig Dispense Refill  . aspirin 81 MG tablet Take 81 mg by mouth daily.    . benazepril-hydrochlorthiazide (LOTENSIN HCT) 20-12.5 MG per tablet TAKE 1 TABLET BY MOUTH ONCE DAILY 30 tablet 6  . buPROPion (WELLBUTRIN XL) 300 MG 24 hr tablet TAKE 1 TABLET BY MOUTH ONCE DAILY 90 tablet PRN  . Calcium-Magnesium-Vitamin D (CALCIUM 500 PO) Take by mouth 2 (two) times daily.    . cholecalciferol (VITAMIN D) 1000 UNITS tablet Take 2,000 Units by mouth daily.     . Esomeprazole Magnesium (NEXIUM PO) Take 22.5 mg by mouth daily.    Marland Kitchen FREESTYLE LITE test strip USE TO CHECK BLOOD SUGAR TWICE DAILY AND AS DIRECTED 100 each 1  . gabapentin (NEURONTIN) 300 MG capsule TAKE 1-2 CAPSULES BY MOUTH AS NEEDED AT BEDTIME  FOR RLS. 90 capsule 3  . glucose monitoring kit (FREESTYLE) monitoring kit 1 each by Does not apply route as needed for other. Use as directed 1 each 0  . Lancets (FREESTYLE) lancets USE TO CHECK BLOOD SUGAR TWICE DAILY AND AS DIRECTED 100 each 1  . Liraglutide (VICTOZA) 18 MG/3ML SOPN Inject 0.2 mLs (1.2 mg total) into the skin daily. 9 mL 5  . Magnesium 400 MG CAPS Take by mouth daily.    . metFORMIN (GLUCOPHAGE) 1000 MG tablet TAKE 1 TABLET BY MOUTH 2 TIMES DAILY WITH A MEAL. 180 tablet 2  . metoprolol succinate (TOPROL-XL) 25 MG 24 hr tablet TAKE 1 TABLET BY MOUTH DAILY. 90 tablet 3  . Multiple Vitamins-Minerals (HM MULTIVITAMIN ADULT GUMMY PO) Take 1 tablet by mouth daily.    . simvastatin (ZOCOR) 40 MG tablet TAKE 1 TABLET BY MOUTH AT BEDTIME. 90 tablet 3  . traMADol (ULTRAM) 50 MG tablet Take 1 tablet (50 mg total) by mouth every 8 (eight) hours as needed. for pain 120 tablet 0   No current facility-administered medications on file prior to visit.    No Known Allergies  Past Medical History  Diagnosis Date  . Depression   . Diabetes mellitus   . GERD (gastroesophageal reflux disease)   . Hyperlipidemia   . Hypertension   .  Heart murmur   . Endometrial ca ([place])     1998  . Heel spur   . PONV (postoperative nausea and vomiting)     after hysterectomy    Past Surgical History  Procedure Laterality Date  . Abdominal hysterectomy  1998  . Left knee arthroscopy  12/2010  . Total knee arthroplasty  12/20/2011    Procedure: TOTAL KNEE ARTHROPLASTY;  Surgeon: Gearlean Alf, MD;  Location: [facility];  Service: Orthopedics;  Laterality: Left;  Failed attempt at spinal   . Femur im nail  10/25/2012    Procedure: INTRAMEDULLARY (IM) NAIL FEMORAL;  Surgeon: Mauri Pole, MD;  Location: [facility];  Service: Orthopedics;  Laterality: Left;  . Hip surgery      Family History  Problem Relation Age of Onset  . Cancer Mother     breast cancer  . Cancer Father     plasma sarcoma     Social History   Social History  . Marital Status: Single    Spouse Name: N/A  . Number of Children: N/A  . Years of Education: N/A   Occupational History  . 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 Smoking status: Never Smoker   . Smokeless tobacco: Never Used  . Alcohol Use: No  . Drug Use: No  . Sexual Activity: Not on file   Other Topics Concern  . 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    Review of Systems  Constitutional: Negative.   HENT: Negative.   Respiratory: Negative.   Cardiovascular: Negative.   Gastrointestinal: Negative.   Endocrine: Negative.   Musculoskeletal: Negative.   Skin: Negative.   Neurological: Negative.   Psychiatric/Behavioral: Negative.      Objective:    BP 146/82 mmHg  Pulse 91  Temp(Src) 97.9 F (36.6 C) (Oral)  Wt 198 lb (89.812 kg)  SpO2 94%  Wt Readings from Last 3 Encounters:  10/15/15 198 lb (89.812 kg)  10/15/15 196 lb 12.8 oz (89.268 kg)  08/14/15 198 lb 6.4 oz (89.994 kg)     Physical Exam  Constitutional: She is oriented to person, place, and time. She appears well-developed and well-nourished. No distress.  HENT:  Head: Normocephalic.  Eyes: Conjunctivae are normal.  Cardiovascular: Normal rate.   Pulmonary/Chest: Effort normal.  Musculoskeletal: Normal range of motion. She exhibits no edema.  Neurological: She is alert and oriented to person, place, and time. No cranial nerve deficit.  Skin: Skin is warm and dry.  Psychiatric: She has a normal mood and affect. Her behavior is normal. Judgment normal.  Nursing note and vitals reviewed.      Assessment & Plan:   Type 2 diabetes mellitus with hyperglycemia, without long-term current use of insulin (HCC)  Essential hypertension  HLD (hyperlipidemia) No Follow-up  on file.

## 2015-10-15 NOTE — Progress Notes (Signed)
Pre visit review using our clinic review tool, if applicable. No additional management support is needed unless otherwise documented below in the visit note. 

## 2015-10-15 NOTE — Patient Instructions (Signed)
Please continue: - Metformin 1000 mg 2x a day - Victoza 1.2 mg daily in am  If the sugars start to increase >> please increase Victoza dose to 1.5-1.8 mg daily.  Please return in 3 months with your sugar log.

## 2015-11-05 DIAGNOSIS — M19011 Primary osteoarthritis, right shoulder: Secondary | ICD-10-CM | POA: Diagnosis not present

## 2015-11-10 ENCOUNTER — Telehealth: Payer: Self-pay | Admitting: Internal Medicine

## 2015-11-10 ENCOUNTER — Other Ambulatory Visit: Payer: Self-pay | Admitting: *Deleted

## 2015-11-10 MED ORDER — GLIPIZIDE 5 MG PO TABS: ORAL_TABLET | ORAL | Status: DC

## 2015-11-10 MED FILL — glipiZIDE 5 MG TABS: 5 | 30 days supply | Qty: 45 | Fill #0

## 2015-11-10 NOTE — Telephone Encounter (Signed)
She was on Glipizide before >> please take 1 tablet of 5 mg before b'fast and 1/2 tablet before dinner for the next 1-2 days, until sugars decreasing.

## 2015-11-10 NOTE — Telephone Encounter (Signed)
Called pt and lvm on her cell phone. Advised her per Dr Arman Filter message below. Advised her to let us know if she needs a refill of the Glipizide.

## 2015-11-10 NOTE — Telephone Encounter (Signed)
Pt had cortisone inj and it is causing sugars to be in the 200s please advise

## 2015-11-10 NOTE — Telephone Encounter (Signed)
Pt returned call to let us know she needs an rx for Glipizide 5 mg sent to [facility].

## 2015-11-10 NOTE — Telephone Encounter (Signed)
Please read message below and advise.

## 2015-11-19 MED FILL — VICTOZA 18 MG/3 ML INJECT P: 18 | 30 days supply | Qty: 9 | Fill #1

## 2015-11-28 ENCOUNTER — Other Ambulatory Visit: Payer: Self-pay | Admitting: *Deleted

## 2015-11-28 MED ORDER — CLONAZEPAM 0.5 MG PO TABS: 0.5000 mg | ORAL_TABLET | Freq: Three times a day (TID) | ORAL | Status: DC | PRN

## 2015-11-28 MED FILL — clonazePAM 0.5 MG TABS: 0.5 | 30 days supply | Qty: 90 | Fill #0

## 2015-11-28 NOTE — Telephone Encounter (Signed)
Last f/u 04/2015

## 2015-11-28 NOTE — Telephone Encounter (Signed)
Rx called in to requested pharmacy 

## 2015-12-03 ENCOUNTER — Other Ambulatory Visit: Payer: Self-pay | Admitting: Family Medicine

## 2015-12-03 MED FILL — GABAPENTIN 300 MG CAPSULE: 300 | 45 days supply | Qty: 90 | Fill #3

## 2015-12-03 MED FILL — BENAZEPRIL-HCTZ 20-12.5 MG: 20-12.5 | 30 days supply | Qty: 30 | Fill #0

## 2015-12-03 MED FILL — UNIFINE PENTIPS 8MM 31G: 31G X 8 MM | 90 days supply | Qty: 100 | Fill #3

## 2015-12-05 ENCOUNTER — Telehealth: Payer: Self-pay

## 2015-12-05 DIAGNOSIS — E785 Hyperlipidemia, unspecified: Secondary | ICD-10-CM

## 2015-12-05 NOTE — Telephone Encounter (Signed)
Pt is coming on 3/31, and states Dr. Rockey Situ wanted her to have a CT calcium score test before she had her appt. She would like to schedule this. Please call.

## 2015-12-05 NOTE — Telephone Encounter (Signed)
Spoke w/ pt.  She is sched for CT Calcium Score 01/13/16 @ 9:00. She verbalizes understanding to arrive @ 8:45 and there is a one-time fee of $150 due at the time of her procedure. She is appreciative and will call back w/ any questions or concerns.

## 2015-12-12 ENCOUNTER — Other Ambulatory Visit: Payer: Self-pay | Admitting: *Deleted

## 2015-12-12 MED ORDER — LIRAGLUTIDE 18 MG/3ML ~~LOC~~ SOPN: 1.2000 mg | PEN_INJECTOR | Freq: Every day | SUBCUTANEOUS | Status: DC

## 2015-12-17 MED FILL — glipiZIDE 5 MG TABS: 5 | 30 days supply | Qty: 45 | Fill #1

## 2015-12-17 MED FILL — SIMVASTATIN 40 MG TABLET: 40 | 90 days supply | Qty: 90 | Fill #3

## 2015-12-22 ENCOUNTER — Other Ambulatory Visit: Payer: Self-pay | Admitting: *Deleted

## 2015-12-22 MED ORDER — GLIPIZIDE 5 MG PO TABS: ORAL_TABLET | ORAL | Status: DC

## 2015-12-22 NOTE — Telephone Encounter (Signed)
Wrong chart. Opened encounter in error.

## 2015-12-23 MED FILL — VICTOZA 18 MG/3 ML INJECT P: 18 | 30 days supply | Qty: 9 | Fill #2

## 2016-01-05 MED FILL — BENAZEPRIL-HCTZ 20-12.5 MG: 20-12.5 | 30 days supply | Qty: 30 | Fill #1

## 2016-01-09 ENCOUNTER — Other Ambulatory Visit: Payer: Self-pay | Admitting: *Deleted

## 2016-01-09 NOTE — Telephone Encounter (Signed)
Last f/u 04/2015

## 2016-01-12 MED ORDER — CLONAZEPAM 0.5 MG PO TABS: 0.5000 mg | ORAL_TABLET | Freq: Three times a day (TID) | ORAL | Status: DC | PRN

## 2016-01-12 MED FILL — clonazePAM 0.5 MG TABS: 0.5 | 30 days supply | Qty: 90 | Fill #0

## 2016-01-12 NOTE — Telephone Encounter (Signed)
Rx called in to requested pharmacy 

## 2016-01-13 ENCOUNTER — Ambulatory Visit (INDEPENDENT_AMBULATORY_CARE_PROVIDER_SITE_OTHER): Payer: Medicare Other | Admitting: Internal Medicine

## 2016-01-13 ENCOUNTER — Other Ambulatory Visit (INDEPENDENT_AMBULATORY_CARE_PROVIDER_SITE_OTHER): Payer: Medicare Other | Admitting: *Deleted

## 2016-01-13 ENCOUNTER — Ambulatory Visit (INDEPENDENT_AMBULATORY_CARE_PROVIDER_SITE_OTHER)
Admission: RE | Admit: 2016-01-13 | Discharge: 2016-01-13 | Disposition: A | Discharge status: Home / Self Care | Payer: Medicare Other | Source: Ambulatory Visit | Attending: Cardiovascular Disease | Admitting: Cardiovascular Disease

## 2016-01-13 ENCOUNTER — Encounter: Payer: Self-pay | Admitting: Internal Medicine

## 2016-01-13 VITALS — BP 120/70 | HR 76 | Temp 98.7°F | Resp 12 | Wt 196.6 lb

## 2016-01-13 DIAGNOSIS — E1165 Type 2 diabetes mellitus with hyperglycemia: Secondary | ICD-10-CM

## 2016-01-13 DIAGNOSIS — E785 Hyperlipidemia, unspecified: Secondary | ICD-10-CM

## 2016-01-13 LAB — POCT GLYCOSYLATED HEMOGLOBIN (HGB A1C): Hemoglobin A1C: 6.8

## 2016-01-13 MED ORDER — GLIPIZIDE 5 MG PO TABS: ORAL_TABLET | ORAL | Status: DC

## 2016-01-13 MED FILL — glipiZIDE 5 MG TABS: 5 | 90 days supply | Qty: 90 | Fill #0

## 2016-01-13 NOTE — Progress Notes (Signed)
Patient ID: Brenda [name], female   DOB: 07/08/1951, 65 y.o.   MRN: [id number]  HPI: Brenda [name] is a 65 y.o.-year-old female, returning for follow-up for DM2, dx in 2007, non-insulin-dependent, uncontrolled, with complications (mild CKD). Last visit 3 months ago.  Last hemoglobin A1c was: Lab Results  Component Value Date   HGBA1C 6.7 10/15/2015   HGBA1C 10.8* 07/16/2015   HGBA1C 8.6* 04/09/2015   Pt is on a regimen of: - Metformin 1000 mg 2x a day, with meals - Victoza (started 12/2014) 1.8 mg daily She was on Glipizide in 11/2015 after a steroid inj. At last visit, we stopped Glipizide 5 mg 2x a day, before meals, because of lows.  Pt checks her sugars 3 a day and they are: - am: 55-153 >> 60, 79, 91-140 >> 86-143, 170 - 2h after b'fast: 114 >> 105, 129 >> n/c - before lunch: 49 x1, 53-136 >> 51, 60-116 >> 53, 58 - 2h after lunch: 71 >> 64-138 >> 64, 81-138 >> n/c - before dinner: 77-143 >> 86, 74-138, 157, 165 >> 69-128, 139 - 2h after dinner: n/c >> 147 >> 151 - bedtime: n/c >> 89-158 >> n/c - nighttime: n/c No lows. Lowest sugar was 49, 50s >> 60 >> ; she has hypoglycemia awareness at 70.  Highest sugar was 177 >> 165.  Glucometer: Freestyle Lite  Pt's meals are much improved after the last hemoglobin A1c: - Breakfast: English muffin + preserve or cereal or yoghurt - snack: fruit or fruit + yoghurt - Lunch: 1/2 sandwich + chips + salsa + fruit/sugar free - Dinner: soup or chicken/steak + veggies, occasional starch or Lean cuisine or light salad  - + mild CKD, last BUN/creatinine:  Lab Results  Component Value Date   BUN 25* 07/16/2015   CREATININE 1.16 07/16/2015  On Benazepril. - last set of lipids: Lab Results  Component Value Date   CHOL 137 07/16/2015   HDL 31.20* 07/16/2015   LDLCALC 68 07/16/2015   TRIG 188.0* 07/16/2015   CHOLHDL 4 07/16/2015  On Simvastatin. - last eye exam was on 11/15/2014. No DR. Cataract sx in 12/2014. - no numbness and  tingling in her feet. On Gabapentin for leg cramps at night. On ASA 81.  She has a h/o L TKR and then had a fx of the L hip >> pain in L leg with exercise.   She also has HTN and HL, GERD, depression.  ROS: Constitutional: no weight gain/loss, no fatigue, no subjective hyperthermia/hypothermia, + poor sleep Eyes: no blurry vision, no xerophthalmia ENT: no sore throat, no nodules palpated in throat, no dysphagia/odynophagia, no hoarseness, + tinnitus Cardiovascular: no CP/SOB/palpitations/leg swelling Respiratory: no cough/SOB Gastrointestinal: no N/V/D/C/+ heartburn Musculoskeletal: no muscle/+ joint aches Skin: no rashes Neurological: no tremors/numbness/tingling/dizziness  I reviewed pt's medications, allergies, PMH, social hx, family hx, and changes were documented in the history of present illness. Otherwise, unchanged from my initial visit note.  Past Medical History  Diagnosis Date  . Depression   . Diabetes mellitus   . GERD (gastroesophageal reflux disease)   . Hyperlipidemia   . Hypertension   . Heart murmur   . Endometrial ca ([place])     1998  . Heel spur   . PONV (postoperative nausea and vomiting)     after hysterectomy   Past Surgical History  Procedure Laterality Date  . Abdominal hysterectomy  1998  . Left knee arthroscopy  12/2010  . Total knee arthroplasty  12/20/2011  Procedure: TOTAL KNEE ARTHROPLASTY;  Surgeon: Gearlean Alf, MD;  Location: [facility];  Service: Orthopedics;  Laterality: Left;  Failed attempt at spinal   . Femur im nail  10/25/2012    Procedure: INTRAMEDULLARY (IM) NAIL FEMORAL;  Surgeon: Mauri Pole, MD;  Location: [facility];  Service: Orthopedics;  Laterality: Left;  . Hip surgery     Social History   Social History  . Marital Status: Single    Spouse Name: N/A  . Number of Children: 0   Occupational History  . 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 Smoking status: Never Smoker   . Smokeless tobacco: Never Used   . Alcohol Use: No  . 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 aspirin 81 MG tablet Take 81 mg by mouth daily.    . benazepril-hydrochlorthiazide (LOTENSIN HCT) 20-12.5 MG tablet TAKE 1 TABLET BY MOUTH ONCE DAILY 30 tablet 6  . buPROPion (WELLBUTRIN XL) 300 MG 24 hr tablet TAKE 1 TABLET BY MOUTH ONCE DAILY 90 tablet PRN  . Calcium-Magnesium-Vitamin D (CALCIUM 500 PO) Take by mouth 2 (two) times daily.    . cholecalciferol (VITAMIN D) 1000 UNITS tablet Take 2,000 Units by mouth daily.     . clonazePAM (KLONOPIN) 0.5 MG tablet Take 1 tablet (0.5 mg total) by mouth 3 (three) times daily as needed for anxiety. 90 tablet 0  . Esomeprazole Magnesium (NEXIUM PO) Take 22.5 mg by mouth daily.    . fluocinonide-emollient (LIDEX-E) 0.05 % cream Apply 1 application topically as needed. 30 g 0  . FREESTYLE LITE test strip USE TO CHECK BLOOD SUGAR TWICE DAILY AND AS DIRECTED 100 each 1  . gabapentin (NEURONTIN) 300 MG capsule TAKE 1-2 CAPSULES BY MOUTH AS NEEDED AT BEDTIME FOR RLS. 90 capsule 3  . glipiZIDE (GLUCOTROL) 5 MG tablet Take 1 tablet (5 mg) before breakfast and 1/2 tablet (2.5 mg) before dinner daily as instructed. 135 tablet 0  . glucose monitoring kit (FREESTYLE) monitoring kit 1 each by Does not apply route as needed for other. Use as directed 1 each 0  . Lancets (FREESTYLE) lancets USE TO CHECK BLOOD SUGAR TWICE DAILY AND AS DIRECTED 100 each 1  . Liraglutide (VICTOZA) 18 MG/3ML SOPN Inject 0.2 mLs (1.2 mg total) into the skin daily. 9 mL 2  . Magnesium 400 MG CAPS Take by mouth daily.    . metFORMIN (GLUCOPHAGE) 1000 MG tablet TAKE 1 TABLET BY MOUTH 2 TIMES DAILY WITH A MEAL. 180 tablet 2  . metoprolol succinate (TOPROL-XL) 25 MG 24 hr tablet TAKE 1 TABLET BY MOUTH DAILY. 90 tablet 3  . Multiple Vitamins-Minerals (HM MULTIVITAMIN ADULT GUMMY PO) Take 1 tablet by mouth daily.     . simvastatin (ZOCOR) 40 MG tablet TAKE 1 TABLET BY MOUTH AT BEDTIME. 90 tablet 3  . traMADol (ULTRAM) 50 MG tablet Take 1 tablet (50 mg total) by mouth every 8 (eight) hours as needed. for pain 120 tablet 0   No current facility-administered medications on file prior to visit.   No Known Allergies Family History  Problem Relation Age of Onset  . Cancer Mother     breast cancer  . Cancer Father     plasma sarcoma   PE: BP 120/70 mmHg  Pulse 76  Temp(Src) 98.7 F (37.1 C) (  Oral)  Resp 12  Wt 196 lb 9.6 oz (89.177 kg)  SpO2 96% Body mass index is 37.17 kg/(m^2). Wt Readings from Last 3 Encounters:  01/13/16 196 lb 9.6 oz (89.177 kg)  10/15/15 198 lb (89.812 kg)  10/15/15 196 lb 12.8 oz (89.268 kg)   Constitutional: overweight, truncal obesity, in NAD Eyes: PERRLA, EOMI, no exophthalmos ENT: moist mucous membranes, no thyromegaly, no cervical lymphadenopathy Cardiovascular: tachycardia, RR, No MRG Respiratory: CTA B Gastrointestinal: abdomen soft, NT, ND, BS+ Musculoskeletal: no deformities, strength intact in all 4 Skin: moist, warm, no rashes Neurological: no tremor with outstretched hands, DTR normal in all 4  ASSESSMENT: 1. DM2, non-insulin-dependent, uncontrolled, with complications - mild CKD  PLAN:  1. Patient with long-standing, previously uncontrolled diabetes, now on Metformin, Victoza, Glipizide with significant improvement in her blood sugars in the last 6 mo. She had 2 low CBGs since last visit (? Reason), before lunch, but no lows in last 2 mo. - I suggested to:  Patient Instructions  Please continue: - Metformin 1000 mg 2x a day - Victoza 1.8 mg daily in am - Glipizide ER 2.5 mg 2x a day before meals  Please return in 3 months with your sugar log.   - continue checking sugars at different times of the day - check 2 times a day, rotating checks - given more sugar logs  - advised for yearly eye exams. She is UTD. Next exam in May. - UTD with flu  shot - checked HbA1c today >> 6.8% (stable, great!) - Return to clinic in 3 mo with sugar log

## 2016-01-13 NOTE — Patient Instructions (Addendum)
Please continue: - Metformin 1000 mg 2x a day - Victoza 1.8 mg daily in am - Glipizide ER 2.5 mg 2x a day before meals  Please return in 3 months with your sugar log.

## 2016-01-15 ENCOUNTER — Other Ambulatory Visit: Payer: Self-pay | Admitting: Cardiovascular Disease

## 2016-01-15 MED FILL — GABAPENTIN 300 MG CAPSULE: 300 | 45 days supply | Qty: 90 | Fill #2

## 2016-01-15 MED FILL — METOPROLOL SUCC ER 25 MG TA: 25 | 90 days supply | Qty: 90 | Fill #0

## 2016-01-20 MED FILL — VICTOZA 18 MG/3 ML INJECT P: 18 | 30 days supply | Qty: 9 | Fill #3

## 2016-01-30 ENCOUNTER — Ambulatory Visit: Payer: Medicare Other | Admitting: Cardiovascular Disease

## 2016-01-31 ENCOUNTER — Other Ambulatory Visit: Payer: Self-pay | Admitting: Family Medicine

## 2016-02-02 ENCOUNTER — Other Ambulatory Visit: Payer: Self-pay | Admitting: Family Medicine

## 2016-02-02 MED FILL — BUPROPION HCL XL 300 MG TAB: 300 | 90 days supply | Qty: 90 | Fill #0

## 2016-02-04 ENCOUNTER — Telehealth: Payer: Self-pay | Admitting: Internal Medicine

## 2016-02-04 ENCOUNTER — Other Ambulatory Visit: Payer: Self-pay | Admitting: Family Medicine

## 2016-02-04 MED ORDER — GLUCOSE BLOOD VI STRP: ORAL_STRIP | Status: DC

## 2016-02-04 MED ORDER — FREESTYLE LANCETS MISC: Status: DC

## 2016-02-04 MED ORDER — METFORMIN HCL 1000 MG PO TABS: ORAL_TABLET | ORAL | Status: DC

## 2016-02-04 MED FILL — BENAZEPRIL-HCTZ 20-12.5 MG: 20-12.5 | 30 days supply | Qty: 30 | Fill #2

## 2016-02-04 MED FILL — metFORMIN HCL 1000 MG TABS: 1000 | 90 days supply | Qty: 180 | Fill #0

## 2016-02-04 NOTE — Telephone Encounter (Signed)
Pt is asking for a call back regarding the rx refills for metformin 100 mg bid called [facility] and pt needs test strips and lancets for her freestyle meter called to [facility].

## 2016-02-04 NOTE — Telephone Encounter (Signed)
Lm on pts vm and informed her that she sees endo, and DM refills must be approved by them.

## 2016-02-10 ENCOUNTER — Other Ambulatory Visit: Payer: Self-pay | Admitting: *Deleted

## 2016-02-10 MED ORDER — ONETOUCH VERIO FLEX SYSTEM W/DEVICE KIT: 1.0000 | PACK | Freq: Every day | Status: DC

## 2016-02-10 MED ORDER — GLUCOSE BLOOD VI STRP: ORAL_STRIP | Status: DC

## 2016-02-10 MED ORDER — ONETOUCH DELICA LANCETS FINE MISC: Status: DC

## 2016-02-10 NOTE — Telephone Encounter (Signed)
Pt's ins no longer covers Freestyle Lite products. Sending One Touch Verio Flex meter, strips and lancets.

## 2016-02-18 MED FILL — VICTOZA 18 MG/3 ML INJECT P: 18 | 30 days supply | Qty: 9 | Fill #4

## 2016-02-26 ENCOUNTER — Other Ambulatory Visit: Payer: Self-pay

## 2016-02-26 MED ORDER — CLONAZEPAM 0.5 MG PO TABS: 0.5000 mg | ORAL_TABLET | Freq: Three times a day (TID) | ORAL | Status: DC | PRN

## 2016-02-26 MED FILL — clonazePAM 0.5 MG TABS: 0.5 | 30 days supply | Qty: 90 | Fill #0

## 2016-02-26 NOTE — Telephone Encounter (Signed)
Medication phoned to [facility] as instructed. Pt notified refill done and pt voiced understanding.

## 2016-02-26 NOTE — Telephone Encounter (Signed)
Pt left v/m requesting status of faxed refill on 02/24/16 for clonazepam to [facility] outpt pharmacy. Pt request cb. Last refilled # 90 on 01/12/16 and last seen 10/15/15.

## 2016-02-27 DIAGNOSIS — M19011 Primary osteoarthritis, right shoulder: Secondary | ICD-10-CM | POA: Diagnosis not present

## 2016-02-27 MED FILL — METHOCARBAMOL 500 MG TABLET: 500 | 10 days supply | Qty: 30 | Fill #0

## 2016-02-27 MED FILL — MELOXICAM 15 MG TABLET: 15 | 30 days supply | Qty: 30 | Fill #0

## 2016-03-01 DIAGNOSIS — E119 Type 2 diabetes mellitus without complications: Secondary | ICD-10-CM | POA: Diagnosis not present

## 2016-03-01 DIAGNOSIS — Z961 Presence of intraocular lens: Secondary | ICD-10-CM | POA: Diagnosis not present

## 2016-03-01 LAB — HM DIABETES EYE EXAM

## 2016-03-02 ENCOUNTER — Emergency Department (HOSPITAL_COMMUNITY)
Admission: EM | Admit: 2016-03-02 | Discharge: 2016-03-02 | Disposition: A | Discharge status: Home / Self Care | Payer: PRIVATE HEALTH INSURANCE | Attending: Emergency Medicine | Admitting: Emergency Medicine

## 2016-03-02 ENCOUNTER — Encounter (HOSPITAL_COMMUNITY): Payer: Self-pay

## 2016-03-02 ENCOUNTER — Emergency Department (HOSPITAL_COMMUNITY): Payer: PRIVATE HEALTH INSURANCE

## 2016-03-02 DIAGNOSIS — E785 Hyperlipidemia, unspecified: Secondary | ICD-10-CM | POA: Diagnosis not present

## 2016-03-02 DIAGNOSIS — E119 Type 2 diabetes mellitus without complications: Secondary | ICD-10-CM | POA: Insufficient documentation

## 2016-03-02 DIAGNOSIS — M25562 Pain in left knee: Secondary | ICD-10-CM

## 2016-03-02 DIAGNOSIS — Y9289 Other specified places as the place of occurrence of the external cause: Secondary | ICD-10-CM | POA: Diagnosis not present

## 2016-03-02 DIAGNOSIS — Y9389 Activity, other specified: Secondary | ICD-10-CM | POA: Insufficient documentation

## 2016-03-02 DIAGNOSIS — I10 Essential (primary) hypertension: Secondary | ICD-10-CM | POA: Insufficient documentation

## 2016-03-02 DIAGNOSIS — Z8541 Personal history of malignant neoplasm of cervix uteri: Secondary | ICD-10-CM | POA: Diagnosis not present

## 2016-03-02 DIAGNOSIS — Z7984 Long term (current) use of oral hypoglycemic drugs: Secondary | ICD-10-CM | POA: Diagnosis not present

## 2016-03-02 DIAGNOSIS — Z79899 Other long term (current) drug therapy: Secondary | ICD-10-CM | POA: Diagnosis not present

## 2016-03-02 DIAGNOSIS — Y990 Civilian activity done for income or pay: Secondary | ICD-10-CM | POA: Diagnosis not present

## 2016-03-02 DIAGNOSIS — F329 Major depressive disorder, single episode, unspecified: Secondary | ICD-10-CM | POA: Diagnosis not present

## 2016-03-02 DIAGNOSIS — Z8739 Personal history of other diseases of the musculoskeletal system and connective tissue: Secondary | ICD-10-CM | POA: Insufficient documentation

## 2016-03-02 DIAGNOSIS — K219 Gastro-esophageal reflux disease without esophagitis: Secondary | ICD-10-CM | POA: Diagnosis not present

## 2016-03-02 DIAGNOSIS — W01198A Fall on same level from slipping, tripping and stumbling with subsequent striking against other object, initial encounter: Secondary | ICD-10-CM | POA: Insufficient documentation

## 2016-03-02 DIAGNOSIS — R011 Cardiac murmur, unspecified: Secondary | ICD-10-CM | POA: Insufficient documentation

## 2016-03-02 DIAGNOSIS — Z7982 Long term (current) use of aspirin: Secondary | ICD-10-CM | POA: Diagnosis not present

## 2016-03-02 DIAGNOSIS — S8992XA Unspecified injury of left lower leg, initial encounter: Secondary | ICD-10-CM | POA: Insufficient documentation

## 2016-03-02 DIAGNOSIS — W19XXXA Unspecified fall, initial encounter: Secondary | ICD-10-CM

## 2016-03-02 NOTE — Discharge Instructions (Signed)
Ms. Brenda [name],  Nice meeting you! Please follow-up with your primary care provider. Return to the emergency department if you develop numbness, tingling, color changes, increased swelling, increased pain despite muscle relaxer and anti-inflammatory, new/worsening symptoms. Feel better soon!  S. Wendie Simmer, PA-C

## 2016-03-02 NOTE — ED Provider Notes (Signed)
CSN: [id number]     Arrival date & time 03/02/16  3267 History  By signing my name below, I, Eustaquio Maize, attest that this documentation has been prepared under the direction and in the presence of Wendie Simmer, PA-C. Electronically Signed: Eustaquio Maize, ED Scribe. 03/02/2016. 9:07 AM.     Chief Complaint  Patient presents with  . fall at work/knee pain    The history is provided by the patient. No language interpreter was used.     HPI Comments: Brenda [name] is a 65 y.o. female with PSHx left knee replacement, who presents to the Emergency Department complaining of sudden onset, constant, left knee swelling s/p ground level fall that occurred earlier this morning. Pt reports that her foot caught the wheel of a wheelchair at work causing her to trip and fall, hitting her left knee on the ground. No head injury or LOC. Pt was able to get up immediately after the fall with assistance from co-workers. She states she usually has difficulty getting up due to previous left hip fracture. Pt denies hitting her hip today. Pt denies any pain to the knee but states a hematoma formed immediately afterwards. She has been applying ice to the area with improvement from the swelling. She has not taken any medications due to not having any pain. Denies urinary or bowel incontinence, weakness, numbness, tingling, or any other associated symptoms.   Past Medical History  Diagnosis Date  . Depression   . Diabetes mellitus   . GERD (gastroesophageal reflux disease)   . Hyperlipidemia   . Hypertension   . Heart murmur   . Endometrial ca ([place])     1998  . Heel spur   . PONV (postoperative nausea and vomiting)     after hysterectomy   Past Surgical History  Procedure Laterality Date  . Abdominal hysterectomy  1998  . Left knee arthroscopy  12/2010  . Total knee arthroplasty  12/20/2011    Procedure: TOTAL KNEE ARTHROPLASTY;  Surgeon: Gearlean Alf, MD;  Location: [facility];  Service: Orthopedics;   Laterality: Left;  Failed attempt at spinal   . Femur im nail  10/25/2012    Procedure: INTRAMEDULLARY (IM) NAIL FEMORAL;  Surgeon: Mauri Pole, MD;  Location: [facility];  Service: Orthopedics;  Laterality: Left;  . Hip surgery     Family History  Problem Relation Age of Onset  . Cancer Mother     breast cancer  . Cancer Father     plasma sarcoma   Social History  Substance Use Topics  . Smoking status: Never Smoker   . Smokeless tobacco: Never Used  . Alcohol Use: No   OB History    No data available     Review of Systems A complete 10 system review of systems was obtained and all systems are negative except as noted in the HPI and PMH.   Allergies  Review of patient's allergies indicates no known allergies.  Home Medications   Prior to Admission medications   Medication Sig Start Date End Date Taking? Authorizing Provider  aspirin 81 MG tablet Take 81 mg by mouth daily.    Historical Provider, MD  benazepril-hydrochlorthiazide (LOTENSIN HCT) 20-12.5 MG tablet TAKE 1 TABLET BY MOUTH ONCE DAILY 12/03/15   Lucille Passy, MD  Blood Glucose Monitoring Suppl ([place]) w/Device KIT 1 each by Does not apply route daily. Dx: E11.65 02/10/16   Philemon Kingdom, MD  buPROPion (WELLBUTRIN XL) 300 MG 24  hr tablet TAKE 1 TABLET BY MOUTH ONCE DAILY 02/02/16   Lucille Passy, MD  Calcium-Magnesium-Vitamin D (CALCIUM 500 PO) Take by mouth 2 (two) times daily.    Historical Provider, MD  cholecalciferol (VITAMIN D) 1000 UNITS tablet Take 2,000 Units by mouth daily.     Historical Provider, MD  clonazePAM (KLONOPIN) 0.5 MG tablet Take 1 tablet (0.5 mg total) by mouth 3 (three) times daily as needed for anxiety. 02/26/16   Lucille Passy, MD  Esomeprazole Magnesium (NEXIUM PO) Take 22.5 mg by mouth daily.    Historical Provider, MD  fluocinonide-emollient (LIDEX-E) 0.05 % cream Apply 1 application topically as needed. 10/15/15   Lucille Passy, MD  gabapentin (NEURONTIN) 300 MG capsule  TAKE 1-2 CAPSULES BY MOUTH AS NEEDED AT BEDTIME FOR RLS. 07/16/15   Lucille Passy, MD  glipiZIDE (GLUCOTROL) 5 MG tablet Take 1/2 tablet (2.5 mg) 2x daily as instructed. 01/13/16   Philemon Kingdom, MD  glucose blood (ONETOUCH VERIO) test strip Use to test blood sugar twice daily as instructed. Dx: E11.65 02/10/16   Philemon Kingdom, MD  Liraglutide (VICTOZA) 18 MG/3ML SOPN Inject 0.2 mLs (1.2 mg total) into the skin daily. 12/12/15   Philemon Kingdom, MD  metFORMIN (GLUCOPHAGE) 1000 MG tablet TAKE 1 TABLET BY MOUTH 2 TIMES DAILY WITH A MEAL. 02/04/16   Philemon Kingdom, MD  metoprolol succinate (TOPROL-XL) 25 MG 24 hr tablet TAKE 1 TABLET BY MOUTH DAILY. 01/15/16   Minna Merritts, MD  Multiple Vitamins-Minerals (HM MULTIVITAMIN ADULT GUMMY PO) Take 1 tablet by mouth daily.    Historical Provider, MD  [facility] DELICA LANCETS FINE MISC Use to test blood sugar twice daily as instructed. Dx: E11.65 02/10/16   Philemon Kingdom, MD  simvastatin (ZOCOR) 40 MG tablet TAKE 1 TABLET BY MOUTH AT BEDTIME. 03/03/15   Minna Merritts, MD  UNIFINE PENTIPS 31G X 8 MM MISC  12/03/15   Historical Provider, MD   BP 142/77 mmHg  Pulse 77  Temp(Src) 98.4 F (36.9 C) (Oral)  Resp 18  Ht 5' 1"  (1.549 m)  Wt 190 lb (86.183 kg)  BMI 35.92 kg/m2  SpO2 98%   Physical Exam  Constitutional: She is oriented to person, place, and time. She appears well-developed and well-nourished. No distress.  HENT:  Head: Normocephalic and atraumatic.  Eyes: Conjunctivae and EOM are normal.  Neck: Neck supple. No tracheal deviation present.  Cardiovascular: Normal rate.   Pulmonary/Chest: Effort normal. No respiratory distress.  Musculoskeletal: Normal range of motion.       Left knee: She exhibits erythema. No tenderness found.  Well healed midline incision scar overlying left knee. Erythema over the left patella.  NVI bilaterally.   Neurological: She is alert and oriented to person, place, and time.  Skin: Skin is warm and dry.   Psychiatric: She has a normal mood and affect. Her behavior is normal.  Nursing note and vitals reviewed.   ED Course  Procedures   DIAGNOSTIC STUDIES: Oxygen Saturation is 98% on RA, normal by my interpretation.    COORDINATION OF CARE: 9:06 AM-Discussed treatment plan with pt at bedside and pt agreed to plan.   Imaging Review Dg Knee Complete 4 Views Left  03/02/2016  ADDENDUM REPORT: 03/02/2016 10:03 ADDENDUM: Correction of an error in the IMPRESSION. Impression stood state: IMPRESSION: LEFT knee prosthesis and osseous demineralization without acute bony abnormality. Electronically Signed   By: Lavonia Dana M.D.   On: 03/02/2016 10:03  03/02/2016  CLINICAL  DATA:  [place] this morning, LEFT knee pain, anterior swelling, prior total knee replacement in 2013, initial encounter EXAM: LEFT KNEE - COMPLETE 4+ VIEW COMPARISON:  None FINDINGS: Osseous demineralization. Components of LEFT knee prosthesis in expected positions. No periprosthetic lucency or knee joint effusion. No acute fracture, dislocation or bone destruction. Few scattered atherosclerotic calcifications. IMPRESSION: RIGHT knee prosthesis and osseous demineralization without acute bony abnormality per Electronically Signed: By: Lavonia Dana M.D. On: 03/02/2016 08:57   I have personally reviewed and evaluated these images as part of my medical decision-making.  MDM   Final diagnoses:  Fall, initial encounter  Left knee pain   Patient X-Ray negative for obvious fracture or dislocation.  Pt advised to follow up with orthopedics. Patient given knee sleeve while in ED, conservative therapy recommended and discussed. Patient will be discharged home & is agreeable with above plan. Returns precautions discussed. Pt appears safe for discharge.  I personally performed the services described in this documentation, which was scribed in my presence. The recorded information has been reviewed and is accurate.   Freeland Lions,  PA-C 03/09/16 [place], MD 03/10/16 [phone]

## 2016-03-02 NOTE — ED Notes (Signed)
Patient fell at work this am after tripping over wheelchair. Complains of left knee pain, no loc. No other complaints. Arrived with ice to same

## 2016-03-03 MED FILL — BENAZEPRIL-HCTZ 20-12.5 MG: 20-12.5 | 30 days supply | Qty: 30 | Fill #3

## 2016-03-03 MED FILL — GABAPENTIN 300 MG CAPSULE: 300 | 45 days supply | Qty: 90 | Fill #3

## 2016-03-08 ENCOUNTER — Encounter: Payer: Self-pay | Admitting: *Deleted

## 2016-03-09 DIAGNOSIS — H33012 Retinal detachment with single break, left eye: Secondary | ICD-10-CM | POA: Diagnosis not present

## 2016-03-15 ENCOUNTER — Other Ambulatory Visit: Payer: Self-pay | Admitting: Family Medicine

## 2016-03-15 ENCOUNTER — Other Ambulatory Visit: Payer: Self-pay | Admitting: *Deleted

## 2016-03-15 ENCOUNTER — Other Ambulatory Visit: Payer: Self-pay | Admitting: Cardiovascular Disease

## 2016-03-15 MED ORDER — UNIFINE PENTIPS 31G X 8 MM MISC: Status: DC

## 2016-03-15 MED FILL — SIMVASTATIN 40 MG TABLET: 40 | 90 days supply | Qty: 90 | Fill #0

## 2016-03-15 NOTE — Telephone Encounter (Signed)
Received faxed refill request from pharmacy. Refill sent to pharmacy electronically. 

## 2016-03-17 MED FILL — UNIFINE PENTIPS 8MM 31G: 31G X 8 MM | 90 days supply | Qty: 100 | Fill #0

## 2016-03-22 MED FILL — VICTOZA 18 MG/3 ML INJECT P: 18 | 30 days supply | Qty: 9 | Fill #5

## 2016-03-23 DIAGNOSIS — H33012 Retinal detachment with single break, left eye: Secondary | ICD-10-CM | POA: Diagnosis not present

## 2016-03-30 MED FILL — MELOXICAM 15 MG TABLET: 15 | 30 days supply | Qty: 30 | Fill #1

## 2016-04-01 ENCOUNTER — Encounter: Payer: Self-pay | Admitting: Cardiovascular Disease

## 2016-04-01 ENCOUNTER — Ambulatory Visit (INDEPENDENT_AMBULATORY_CARE_PROVIDER_SITE_OTHER): Payer: Medicare Other | Admitting: Cardiovascular Disease

## 2016-04-01 VITALS — BP 144/84 | HR 80 | Ht 61.0 in | Wt 193.5 lb

## 2016-04-01 DIAGNOSIS — R000 Tachycardia, unspecified: Secondary | ICD-10-CM | POA: Diagnosis not present

## 2016-04-01 DIAGNOSIS — E669 Obesity, unspecified: Secondary | ICD-10-CM

## 2016-04-01 DIAGNOSIS — E1165 Type 2 diabetes mellitus with hyperglycemia: Secondary | ICD-10-CM | POA: Diagnosis not present

## 2016-04-01 DIAGNOSIS — I10 Essential (primary) hypertension: Secondary | ICD-10-CM | POA: Diagnosis not present

## 2016-04-01 DIAGNOSIS — E785 Hyperlipidemia, unspecified: Secondary | ICD-10-CM

## 2016-04-01 MED FILL — glipiZIDE 5 MG TABS: 5 | 90 days supply | Qty: 90 | Fill #1

## 2016-04-01 NOTE — Patient Instructions (Signed)
You are doing well. No medication changes were made.  Please call us if you have new issues that need to be addressed before your next appt.  Your physician wants you to follow-up in: 12 months.  You will receive a reminder letter in the mail two months in advance. If you don't receive a letter, please call our office to schedule the follow-up appointment. 

## 2016-04-01 NOTE — Progress Notes (Signed)
Patient ID: Brenda [name], female   DOB: 04-20-1951, 65 y.o.   MRN: [id number] Cardiology Office Note  Date:  04/01/2016   ID:  Brenda, [name] 04-05-1951, MRN [id number]  PCP:  Brenda Norris, MD   Chief Complaint  Patient presents with  . other    1 yr f/u . Meds reviewed verbally.    HPI:  Ms Brenda [name] is a very pleasant 65 -year-old woman with history of obesity, diabetes For the past 9 years, hyperlipidemia, with previous stress test and echocardiogram several years ago for abnormal EKG, who presents for routine followup of her risk factors Including hyperlipidemia. Known coronary artery disease, CT coronary calcium score 1031  In follow-up today, she reports that she is doing relatively well. She will need shoulder replacement surgery in the near future  Previous hemoglobin A1c 12, down to 9 most recently 6.8 She has been watching her diet closely Continues to work in the hospital, short stay at [facility] No regular exercise program  CT coronary calcium score reviewed with her in detail showing diffuse LAD disease, RCA disease Dramatically less left circumflex disease  Most recent creatinine 1.16  EKG on today's visit shows normal sinus rhythm with rate 80 bpm, no significant ST or T-wave changes    PMH:   has a past medical history of Depression; Diabetes mellitus; GERD (gastroesophageal reflux disease); Hyperlipidemia; Hypertension; Heart murmur; Endometrial ca [facility]); Heel spur; and PONV (postoperative nausea and vomiting).  PSH:    Past Surgical History  Procedure Laterality Date  . Abdominal hysterectomy  1998  . Left knee arthroscopy  12/2010  . Total knee arthroplasty  12/20/2011    Procedure: TOTAL KNEE ARTHROPLASTY;  Surgeon: Brenda Alf, MD;  Location: [facility];  Service: Orthopedics;  Laterality: Left;  Failed attempt at spinal   . Femur im nail  10/25/2012    Procedure: INTRAMEDULLARY (IM) NAIL FEMORAL;  Surgeon: Brenda Pole, MD;  Location: [facility];   Service: Orthopedics;  Laterality: Left;  . Hip surgery    . Refractive surgery      Current Outpatient Prescriptions  Medication Sig Dispense Refill  . aspirin 81 MG tablet Take 81 mg by mouth daily.    . benazepril-hydrochlorthiazide (LOTENSIN HCT) 20-12.5 MG tablet TAKE 1 TABLET BY MOUTH ONCE DAILY 30 tablet 6  . Blood Glucose Monitoring Suppl ([place]) w/Device KIT 1 each by Does not apply route daily. Dx: E11.65 1 kit 0  . buPROPion (WELLBUTRIN XL) 300 MG 24 hr tablet TAKE 1 TABLET BY MOUTH ONCE DAILY 90 tablet 0  . Calcium-Magnesium-Vitamin D (CALCIUM 500 PO) Take by mouth 2 (two) times daily.    . cholecalciferol (VITAMIN D) 1000 UNITS tablet Take 2,000 Units by mouth daily.     . clonazePAM (KLONOPIN) 0.5 MG tablet Take 1 tablet (0.5 mg total) by mouth 3 (three) times daily as needed for anxiety. 90 tablet 0  . Esomeprazole Magnesium (NEXIUM PO) Take 22.5 mg by mouth daily.    . fluocinonide-emollient (LIDEX-E) 0.05 % cream Apply 1 application topically as needed. 30 g 0  . gabapentin (NEURONTIN) 300 MG capsule TAKE 1-2 CAPSULES BY MOUTH AS NEEDED AT BEDTIME FOR RLS. 90 capsule 3  . glipiZIDE (GLUCOTROL) 5 MG tablet Take 1/2 tablet (2.5 mg) 2x daily as instructed. 90 tablet 1  . glucose blood (ONETOUCH VERIO) test strip Use to test blood sugar twice daily as instructed. Dx: E11.65 100 each 4  . Liraglutide (VICTOZA) 18  MG/3ML SOPN Inject 0.2 mLs (1.2 mg total) into the skin daily. 9 mL 2  . meloxicam (MOBIC) 15 MG tablet Take 15 mg by mouth as needed for pain.    . metFORMIN (GLUCOPHAGE) 1000 MG tablet TAKE 1 TABLET BY MOUTH 2 TIMES DAILY WITH A MEAL. 180 tablet 1  . methocarbamol (ROBAXIN) 500 MG tablet Take 500 mg by mouth as needed for muscle spasms.    . metoprolol succinate (TOPROL-XL) 25 MG 24 hr tablet TAKE 1 TABLET BY MOUTH DAILY. 90 tablet 3  . Multiple Vitamins-Minerals (HM MULTIVITAMIN ADULT GUMMY PO) Take 1 tablet by mouth daily.    Brenda [name] DELICA  LANCETS FINE MISC Use to test blood sugar twice daily as instructed. Dx: E11.65 100 each 4  . simvastatin (ZOCOR) 40 MG tablet TAKE 1 TABLET BY MOUTH AT BEDTIME. 90 tablet 3  . UNIFINE PENTIPS 31G X 8 MM MISC Use as directed with Victoza 90 each 0   No current facility-administered medications for this visit.     Allergies:   Review of patient's allergies indicates no known allergies.   Social History:  The patient  reports that she has never smoked. She has never used smokeless tobacco. She reports that she does not drink alcohol or use illicit drugs.   Family History:   family history includes Cancer in her father and mother.    Review of Systems: Review of Systems  Constitutional: Negative.   Respiratory: Negative.   Cardiovascular: Negative.   Gastrointestinal: Negative.   Musculoskeletal: Positive for joint pain.       Severe shoulder discomfort  Neurological: Negative.   Psychiatric/Behavioral: Negative.   All other systems reviewed and are negative.    PHYSICAL EXAM: VS:  BP 144/84 mmHg  Pulse 80  Ht 5' 1"  (1.549 m)  Wt 193 lb 8 oz (87.771 kg)  BMI 36.58 kg/m2 , BMI Body mass index is 36.58 kg/(m^2). GEN: Well nourished, well developed, in no acute distress, Obese HEENT: normal Neck: no JVD, carotid bruits, or masses Cardiac: RRR; no murmurs, rubs, or gallops,no edema  Respiratory:  clear to auscultation bilaterally, normal work of breathing GI: soft, nontender, nondistended, + BS MS: no deformity or atrophy Skin: warm and dry, no rash Neuro:  Strength and sensation are intact Psych: euthymic mood, full affect    Recent Labs: 07/16/2015: ALT 19; BUN 25*; Creatinine, Ser 1.16; Potassium 5.0; Sodium 139    Lipid Panel Lab Results  Component Value Date   CHOL 137 07/16/2015   HDL 31.20* 07/16/2015   LDLCALC 68 07/16/2015   TRIG 188.0* 07/16/2015      Wt Readings from Last 3 Encounters:  04/01/16 193 lb 8 oz (87.771 kg)  03/02/16 190 lb (86.183 kg)   01/13/16 196 lb 9.6 oz (89.177 kg)       ASSESSMENT AND PLAN:  Essential hypertension -  Blood pressure well controlled on today's visit. No changes made to her medications  Tachycardia -  She denies any significant tachycardia, Symptoms improved with metoprolol twice a day  Type 2 diabetes mellitus with hyperglycemia, without long-term current use of insulin ([place]) Dramatic improvement in her diabetes numbers We have congratulated her Recommended she continue to follow strict diet, increase her exercise for weight loss  Obesity  encouraged continued exercise, careful diet management in an effort to lose weight.  HLD (hyperlipidemia) Cholesterol is at goal on the current lipid regimen. No changes to the medications were made.   Disposition:   F/U  12 months   Total encounter time more than 15 minutes  Greater than 50% was spent in counseling and coordination of care with the patient    Orders Placed This Encounter  Procedures  . EKG 12-Lead   Signed, Esmond Plants, M.D., Ph.D. 04/01/2016  [place], [place]

## 2016-04-06 ENCOUNTER — Other Ambulatory Visit: Payer: Self-pay | Admitting: Family Medicine

## 2016-04-06 MED FILL — METHOCARBAMOL 500 MG TABLET: 500 | 10 days supply | Qty: 30 | Fill #1

## 2016-04-06 MED FILL — clonazePAM 0.5 MG TABS: 0.5 | 30 days supply | Qty: 90 | Fill #0

## 2016-04-06 MED FILL — BENAZEPRIL-HCTZ 20-12.5 MG: 20-12.5 | 30 days supply | Qty: 30 | Fill #4

## 2016-04-06 NOTE — Telephone Encounter (Signed)
Rx called in to requested pharmacy 

## 2016-04-06 NOTE — Telephone Encounter (Signed)
Last anxiety f/u 04/2015

## 2016-04-19 ENCOUNTER — Other Ambulatory Visit: Payer: Self-pay | Admitting: Family Medicine

## 2016-04-19 ENCOUNTER — Ambulatory Visit: Payer: Medicare Other | Admitting: Family Medicine

## 2016-04-19 DIAGNOSIS — Z01818 Encounter for other preprocedural examination: Secondary | ICD-10-CM

## 2016-04-19 MED FILL — VICTOZA 2-PAK 18 MG/3 ML PE: 18 | 30 days supply | Qty: 6 | Fill #0

## 2016-04-19 MED FILL — METOPROLOL SUCC ER 25 MG TA: 25 | 90 days supply | Qty: 90 | Fill #1

## 2016-04-19 MED FILL — GABAPENTIN 300 MG CAPSULE: 300 | 45 days supply | Qty: 90 | Fill #0

## 2016-04-23 ENCOUNTER — Encounter: Payer: Self-pay | Admitting: Internal Medicine

## 2016-04-23 ENCOUNTER — Ambulatory Visit (INDEPENDENT_AMBULATORY_CARE_PROVIDER_SITE_OTHER): Payer: Medicare Other | Admitting: Internal Medicine

## 2016-04-23 VITALS — BP 128/82 | HR 75 | Ht 61.0 in | Wt 192.0 lb

## 2016-04-23 DIAGNOSIS — E1165 Type 2 diabetes mellitus with hyperglycemia: Secondary | ICD-10-CM

## 2016-04-23 LAB — POCT GLYCOSYLATED HEMOGLOBIN (HGB A1C): HEMOGLOBIN A1C: 6.8

## 2016-04-23 MED ORDER — GLIPIZIDE ER 2.5 MG PO TB24: 2.5000 mg | ORAL_TABLET | Freq: Two times a day (BID) | ORAL | Status: DC

## 2016-04-23 MED ORDER — LIRAGLUTIDE 18 MG/3ML ~~LOC~~ SOPN: 1.8000 mg | PEN_INJECTOR | Freq: Every day | SUBCUTANEOUS | Status: DC

## 2016-04-23 MED FILL — glipiZIDE ER 2.5 MG TB24: 2.5 | 30 days supply | Qty: 60 | Fill #0

## 2016-04-23 NOTE — Progress Notes (Signed)
Patient ID: Brenda [name], female   DOB: Jan 06, 1951, 65 y.o.   MRN: [id number]  HPI: Brenda [name] is a 65 y.o.-year-old female, returning for follow-up for DM2, dx in 2007, non-insulin-dependent, uncontrolled, with complications (mild CKD). Last visit 3 months ago.  She had laser surgery for a retinal tear.  She fell 2x since last visit.  She will have shoulder surgery in 08/2016.  Last hemoglobin A1c was: Lab Results  Component Value Date   HGBA1C 6.8 01/13/2016   HGBA1C 6.7 10/15/2015   HGBA1C 10.8* 07/16/2015   Pt is on a regimen of: - Metformin 1000 mg 2x a day, with meals - Victoza 1.8 mg daily - started 12/2014 She was on Glipizide in 11/2015 after a steroid inj. At last visit, we stopped Glipizide 5 mg 2x a day, before meals, because of lows.  Pt checks her sugars 3 a day and they are: - am: 55-153 >> 60, 79, 91-140 >> 86-143, 170 >> 83, 96-168, 183, 200 - 2h after b'fast: 114 >> 105, 129 >> n/c >> 156 - before lunch: 49 x1, 53-136 >> 51, 60-116 >> 53, 58 >> 50, 61x2, 104-108 - 2h after lunch: 71 >> 64-138 >> 64, 81-138 >> n/c - before dinner: 77-143 >> 86, 74-138, 157, 165 >> 69-128, 139 >> 60, 90-142, 163 - 2h after dinner: n/c >> 147 >> 151 >> 180 - bedtime: n/c >> 89-158 >> n/c - nighttime: n/c No lows. Lowest sugar was 49, 50s >> 60 >> 50x1; she has hypoglycemia awareness at 70.  Highest sugar was 177 >> 165 >> 200.  Glucometer: Freestyle Lite  Pt's meals are: - Breakfast: English muffin + preserve or cereal or yoghurt - snack: fruit or fruit + yoghurt - Lunch: 1/2 sandwich + chips + salsa + fruit/sugar free - Dinner: soup or chicken/steak + veggies, occasional starch or Lean cuisine or light salad  - + mild CKD, last BUN/creatinine:  Lab Results  Component Value Date   BUN 25* 07/16/2015   CREATININE 1.16 07/16/2015  On Benazepril. - last set of lipids: Lab Results  Component Value Date   CHOL 137 07/16/2015   HDL 31.20* 07/16/2015   LDLCALC  68 07/16/2015   TRIG 188.0* 07/16/2015   CHOLHDL 4 07/16/2015  On Simvastatin. - last eye exam was on 03/01/2016. No DR. Cataract sx in 12/2014. - no numbness and tingling in her feet. On Gabapentin for leg cramps at night. On ASA 81.  She has a h/o L TKR and then had a fx of the L hip >> pain in L leg with exercise.   She also has HTN and HL, GERD, depression.  ROS: Constitutional: no weight gain/loss, no fatigue, no subjective hyperthermia/hypothermia Eyes: no blurry vision, no xerophthalmia ENT: no sore throat, no nodules palpated in throat, no dysphagia/odynophagia, no hoarseness Cardiovascular: no CP/SOB/palpitations/L leg swelling (after L knee and hip sx) Respiratory: no cough/SOB Gastrointestinal: no N/V/D/C/heartburn Musculoskeletal: no muscle/+ joint aches Skin: no rashes Neurological: no tremors/numbness/tingling/dizziness  I reviewed pt's medications, allergies, PMH, social hx, family hx, and changes were documented in the history of present illness. Otherwise, unchanged from my initial visit note.  Past Medical History  Diagnosis Date  . Depression   . Diabetes mellitus   . GERD (gastroesophageal reflux disease)   . Hyperlipidemia   . Hypertension   . Heart murmur   . Endometrial ca ([place])     1998  . Heel spur   . PONV (postoperative nausea and  vomiting)     after hysterectomy   Past Surgical History  Procedure Laterality Date  . Abdominal hysterectomy  1998  . Left knee arthroscopy  12/2010  . Total knee arthroplasty  12/20/2011    Procedure: TOTAL KNEE ARTHROPLASTY;  Surgeon: Gearlean Alf, MD;  Location: [facility];  Service: Orthopedics;  Laterality: Left;  Failed attempt at spinal   . Femur im nail  10/25/2012    Procedure: INTRAMEDULLARY (IM) NAIL FEMORAL;  Surgeon: Mauri Pole, MD;  Location: [facility];  Service: Orthopedics;  Laterality: Left;  . Hip surgery    . Refractive surgery     Social History   Social History  . Marital Status: Single     Spouse Name: N/A  . Number of Children: 0   Occupational History  . 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 Smoking status: Never Smoker   . Smokeless tobacco: Never Used  . Alcohol Use: No  . 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 aspirin 81 MG tablet Take 81 mg by mouth daily.    . benazepril-hydrochlorthiazide (LOTENSIN HCT) 20-12.5 MG tablet TAKE 1 TABLET BY MOUTH ONCE DAILY 30 tablet 6  . Blood Glucose Monitoring Suppl ([place]) w/Device KIT 1 each by Does not apply route daily. Dx: E11.65 1 kit 0  . buPROPion (WELLBUTRIN XL) 300 MG 24 hr tablet TAKE 1 TABLET BY MOUTH ONCE DAILY 90 tablet 0  . Calcium-Magnesium-Vitamin D (CALCIUM 500 PO) Take by mouth 2 (two) times daily.    . cholecalciferol (VITAMIN D) 1000 UNITS tablet Take 2,000 Units by mouth daily.     . clonazePAM (KLONOPIN) 0.5 MG tablet TAKE 1 TABLET BY MOUTH 3 TIMES DAILY AS NEEDED FOR ANXIETY 90 tablet 0  . Esomeprazole Magnesium (NEXIUM PO) Take 22.5 mg by mouth daily.    . fluocinonide-emollient (LIDEX-E) 0.05 % cream Apply 1 application topically as needed. 30 g 0  . gabapentin (NEURONTIN) 300 MG capsule TAKE 1-2 CAPSULES BY MOUTH AS NEEDED AT BEDTIME FOR RLS. 90 capsule 3  . glipiZIDE (GLUCOTROL) 5 MG tablet Take 1/2 tablet (2.5 mg) 2x daily as instructed. 90 tablet 1  . glucose blood (ONETOUCH VERIO) test strip Use to test blood sugar twice daily as instructed. Dx: E11.65 100 each 4  . Liraglutide (VICTOZA) 18 MG/3ML SOPN Inject 0.2 mLs (1.2 mg total) into the skin daily. 9 mL 2  . meloxicam (MOBIC) 15 MG tablet Take 15 mg by mouth as needed for pain.    . metFORMIN (GLUCOPHAGE) 1000 MG tablet TAKE 1 TABLET BY MOUTH 2 TIMES DAILY WITH A MEAL. 180 tablet 1  . methocarbamol (ROBAXIN) 500 MG tablet Take 500 mg by mouth as needed for muscle spasms.    . metoprolol  succinate (TOPROL-XL) 25 MG 24 hr tablet TAKE 1 TABLET BY MOUTH DAILY. 90 tablet 3  . Multiple Vitamins-Minerals (HM MULTIVITAMIN ADULT GUMMY PO) Take 1 tablet by mouth daily.    Glory Rosebush DELICA LANCETS FINE MISC Use to test blood sugar twice daily as instructed. Dx: E11.65 100 each 4  . simvastatin (ZOCOR) 40 MG tablet TAKE 1 TABLET BY MOUTH AT BEDTIME. 90 tablet 3  . UNIFINE PENTIPS 31G X 8 MM MISC Use as directed with Victoza 90 each 0   No current  facility-administered medications on file prior to visit.   No Known Allergies Family History  Problem Relation Age of Onset  . Cancer Mother     breast cancer  . Cancer Father     plasma sarcoma   PE: BP 128/82 mmHg  Pulse 75  Ht 5' 1"  (1.549 m)  Wt 192 lb (87.091 kg)  BMI 36.30 kg/m2  SpO2 97% Body mass index is 36.3 kg/(m^2). Wt Readings from Last 3 Encounters:  04/23/16 192 lb (87.091 kg)  04/01/16 193 lb 8 oz (87.771 kg)  03/02/16 190 lb (86.183 kg)   Constitutional: overweight, truncal obesity, in NAD Eyes: PERRLA, EOMI, no exophthalmos ENT: moist mucous membranes, no thyromegaly, no cervical lymphadenopathy Cardiovascular: RRR, No MRG, + L LE edema Respiratory: CTA B Gastrointestinal: abdomen soft, NT, ND, BS+ Musculoskeletal: no deformities, strength intact in all 4 Skin: moist, warm, + rash on L leg Neurological: no tremor with outstretched hands, DTR normal in all 4  ASSESSMENT: 1. DM2, non-insulin-dependent, uncontrolled, with complications and hypeglycemia - mild CKD  PLAN:  1. Patient with long-standing, previously uncontrolled diabetes, now on Metformin, Victoza, Glipizide, with significant improvement in her blood sugars. She has low CBG before lunch >> will switch from regular glipizide to ER. - I suggested to:  Patient Instructions  Please continue: - Metformin 1000 mg 2x a day - Victoza 1.8 mg daily in am  Please switch from regular Glipizide to Glipizide ER 2.5 mg 2x a day before meals. Please crush  the dose before dinner.  Please return in 3 months with your sugar log.   - continue checking sugars at different times of the day - check 2 times a day, rotating checks - given more sugar logs  - advised for yearly eye exams. She is UTD. - checked HbA1c today >> 6.8% (stable, great!) - Return to clinic in 3 mo with sugar log

## 2016-04-23 NOTE — Patient Instructions (Addendum)
Please continue: - Metformin 1000 mg 2x a day - Victoza 1.8 mg daily in am  Please switch from regular Glipizide to Glipizide ER 2.5 mg 2x a day before meals. Please crush the dose before dinner.  Please return in 3 months with your sugar log.

## 2016-04-23 NOTE — Addendum Note (Signed)
Addended by: Caprice Beaver T on: 04/23/2016 09:30 AM   Modules accepted: Orders

## 2016-04-29 ENCOUNTER — Other Ambulatory Visit: Payer: Self-pay | Admitting: Family Medicine

## 2016-04-29 MED FILL — metFORMIN HCL 1000 MG TABS: 1000 | 90 days supply | Qty: 180 | Fill #1

## 2016-04-29 MED FILL — BUPROPION HCL XL 300 MG TAB: 300 | 30 days supply | Qty: 30 | Fill #0

## 2016-05-06 MED FILL — BENAZEPRIL-HCTZ 20-12.5 MG: 20-12.5 | 30 days supply | Qty: 30 | Fill #5

## 2016-05-12 ENCOUNTER — Ambulatory Visit (INDEPENDENT_AMBULATORY_CARE_PROVIDER_SITE_OTHER): Payer: Medicare Other | Admitting: Family Medicine

## 2016-05-12 ENCOUNTER — Encounter: Payer: Self-pay | Admitting: Family Medicine

## 2016-05-12 VITALS — BP 120/68 | HR 71 | Temp 98.1°F | Wt 195.0 lb

## 2016-05-12 DIAGNOSIS — M19011 Primary osteoarthritis, right shoulder: Secondary | ICD-10-CM

## 2016-05-12 DIAGNOSIS — E785 Hyperlipidemia, unspecified: Secondary | ICD-10-CM

## 2016-05-12 DIAGNOSIS — F32A Depression, unspecified: Secondary | ICD-10-CM

## 2016-05-12 DIAGNOSIS — F329 Major depressive disorder, single episode, unspecified: Secondary | ICD-10-CM

## 2016-05-12 DIAGNOSIS — E1165 Type 2 diabetes mellitus with hyperglycemia: Secondary | ICD-10-CM

## 2016-05-12 DIAGNOSIS — I10 Essential (primary) hypertension: Secondary | ICD-10-CM | POA: Diagnosis not present

## 2016-05-12 MED ORDER — CLONAZEPAM 0.5 MG PO TABS: ORAL_TABLET | ORAL | Status: DC

## 2016-05-12 MED ORDER — BUPROPION HCL ER (XL) 300 MG PO TB24: 300.0000 mg | ORAL_TABLET | Freq: Every day | ORAL | Status: DC

## 2016-05-12 MED FILL — VICTOZA 18 MG/3 ML INJECT P: 18 | 30 days supply | Qty: 9 | Fill #0

## 2016-05-12 MED FILL — clonazePAM 0.5 MG TABS: 0.5 | 30 days supply | Qty: 90 | Fill #0

## 2016-05-12 NOTE — Patient Instructions (Signed)
Great to see you. Let's start Tylenol ES two tablets twice daily scheduled and then take your NSAIDs as needed.

## 2016-05-12 NOTE — Progress Notes (Signed)
Subjective:   Patient ID: Brenda [name], female    DOB: Jan 29, 1951, 65 y.o.   MRN: [id number]  Brenda [name] is a pleasant 65 y.o. year old female who presents to clinic today with Follow-up  on 05/12/2016  HPI:  Right end stage shoulder OA- seeing ortho.  Received cortisone injections earlier this year which were ineffective and caused her blood sugars to spike. Has been told she needs a shoulder replacement.  Taking large doses of NSAIDs and asking what else she can take.  DM-  Now followed by Dr. Cruzita Lederer.  Was last seen earlier today. Note reviewed- a1c is much improved!  NO changes made to rxs. Lab Results  Component Value Date   HGBA1C 6.8 04/23/2016    Currently taking Victoza 0.6 twice daily,metformin 1000 mg twice daily and Glipizide 5 mg twice daily (Glipizide increased in 10/2014).    Neg eye exam 11/15/2014. Prevnar 13 10/2014. On statin.  Lab Results  Component Value Date   HGBA1C 6.8 04/23/2016   Wt Readings from Last 3 Encounters:  05/12/16 195 lb (88.451 kg)  04/23/16 192 lb (87.091 kg)  04/01/16 193 lb 8 oz (87.771 kg)    HLD- on Simvastatin now. Denies myalgias.  Lab Results  Component Value Date   CHOL 137 07/16/2015   HDL 31.20* 07/16/2015   LDLCALC 68 07/16/2015   TRIG 188.0* 07/16/2015   CHOLHDL 4 07/16/2015    Anxiety/depression- has been under better control with current rxs- Wellbutrin XL 300 mg daily, .   Klonipin (but tries to take it only when anxiety is severe).  HTN- has been at goal for a diabetic on current rxs- lotensin HCT, Toprol XL.  Lab Results  Component Value Date   CREATININE 1.16 07/16/2015     Current Outpatient Prescriptions on File Prior to Visit  Medication Sig Dispense Refill  . aspirin 81 MG tablet Take 81 mg by mouth daily.    . benazepril-hydrochlorthiazide (LOTENSIN HCT) 20-12.5 MG tablet TAKE 1 TABLET BY MOUTH ONCE DAILY 30 tablet 6  . Blood Glucose Monitoring Suppl ([place])  w/Device KIT 1 each by Does not apply route daily. Dx: E11.65 1 kit 0  . Calcium-Magnesium-Vitamin D (CALCIUM 500 PO) Take by mouth 2 (two) times daily.    . cholecalciferol (VITAMIN D) 1000 UNITS tablet Take 2,000 Units by mouth daily.     . Esomeprazole Magnesium (NEXIUM PO) Take 22.5 mg by mouth daily.    . fluocinonide-emollient (LIDEX-E) 0.05 % cream Apply 1 application topically as needed. 30 g 0  . gabapentin (NEURONTIN) 300 MG capsule TAKE 1-2 CAPSULES BY MOUTH AS NEEDED AT BEDTIME FOR RLS. 90 capsule 3  . glipiZIDE (GLUCOTROL XL) 2.5 MG 24 hr tablet Take 1 tablet (2.5 mg total) by mouth 2 (two) times daily before a meal. 180 tablet 3  . glucose blood (ONETOUCH VERIO) test strip Use to test blood sugar twice daily as instructed. Dx: E11.65 100 each 4  . Liraglutide (VICTOZA) 18 MG/3ML SOPN Inject 0.3 mLs (1.8 mg total) into the skin daily. 27 mL 3  . meloxicam (MOBIC) 15 MG tablet Take 15 mg by mouth as needed for pain.    . metFORMIN (GLUCOPHAGE) 1000 MG tablet TAKE 1 TABLET BY MOUTH 2 TIMES DAILY WITH A MEAL. 180 tablet 1  . methocarbamol (ROBAXIN) 500 MG tablet Take 500 mg by mouth as needed for muscle spasms.    . metoprolol succinate (TOPROL-XL) 25 MG 24 hr tablet  TAKE 1 TABLET BY MOUTH DAILY. 90 tablet 3  . Multiple Vitamins-Minerals (HM MULTIVITAMIN ADULT GUMMY PO) Take 1 tablet by mouth daily.    Brenda [name] DELICA LANCETS FINE MISC Use to test blood sugar twice daily as instructed. Dx: E11.65 100 each 4  . simvastatin (ZOCOR) 40 MG tablet TAKE 1 TABLET BY MOUTH AT BEDTIME. 90 tablet 3  . UNIFINE PENTIPS 31G X 8 MM MISC Use as directed with Victoza 90 each 0   No current facility-administered medications on file prior to visit.    No Known Allergies  Past Medical History  Diagnosis Date  . Depression   . Diabetes mellitus   . GERD (gastroesophageal reflux disease)   . Hyperlipidemia   . Hypertension   . Heart murmur   . Endometrial ca ([place])     1998  . Heel spur   .  PONV (postoperative nausea and vomiting)     after hysterectomy    Past Surgical History  Procedure Laterality Date  . Abdominal hysterectomy  1998  . Left knee arthroscopy  12/2010  . Total knee arthroplasty  12/20/2011    Procedure: TOTAL KNEE ARTHROPLASTY;  Surgeon: Gearlean Alf, MD;  Location: [facility];  Service: Orthopedics;  Laterality: Left;  Failed attempt at spinal   . Femur im nail  10/25/2012    Procedure: INTRAMEDULLARY (IM) NAIL FEMORAL;  Surgeon: Mauri Pole, MD;  Location: [facility];  Service: Orthopedics;  Laterality: Left;  . Hip surgery    . Refractive surgery      Family History  Problem Relation Age of Onset  . Cancer Mother     breast cancer  . Cancer Father     plasma sarcoma    Social History   Social History  . Marital Status: Single    Spouse Name: N/A  . Number of Children: N/A  . Years of Education: N/A   Occupational History  . 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 Smoking status: Never Smoker   . Smokeless tobacco: Never Used  . Alcohol Use: No  . Drug Use: No  . Sexual Activity: Not on file   Other Topics Concern  . 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    Review of Systems  Constitutional: Negative.   HENT: Negative.   Respiratory: Negative.   Cardiovascular: Negative.   Gastrointestinal: Negative.   Endocrine: Negative.   Musculoskeletal: Positive for arthralgias. Negative for myalgias, neck pain and neck stiffness.  Skin: Negative.   Neurological: Negative.   Psychiatric/Behavioral: Negative.      Objective:    BP 120/68 mmHg  Pulse 71  Temp(Src) 98.1 F (36.7 C) (Oral)  Wt 195 lb (88.451 kg)  SpO2 98%  Wt Readings from Last 3 Encounters:  05/12/16 195 lb (88.451 kg)  04/23/16 192 lb (87.091 kg)  04/01/16 193 lb 8 oz (87.771  kg)     Physical Exam  Constitutional: She is oriented to person, place, and time. She appears well-developed and well-nourished. No distress.  HENT:  Head: Normocephalic.  Eyes: Conjunctivae are normal.  Cardiovascular: Normal rate.   Pulmonary/Chest: Effort normal.  Musculoskeletal: Normal range of motion. She exhibits no edema.  Neurological: She is alert  and oriented to person, place, and time. No cranial nerve deficit.  Skin: Skin is warm and dry.  Psychiatric: She has a normal mood and affect. Her behavior is normal. Judgment normal.  Nursing note and vitals reviewed.      Assessment & Plan:   HLD (hyperlipidemia)  Depression  Essential hypertension  Type 2 diabetes mellitus with hyperglycemia, without long-term current use of insulin (HCC)  Primary osteoarthritis of right shoulder No Follow-up on file.

## 2016-05-12 NOTE — Progress Notes (Signed)
Pre visit review using our clinic review tool, if applicable. No additional management support is needed unless otherwise documented below in the visit note. 

## 2016-05-12 NOTE — Assessment & Plan Note (Signed)
Followed by endo. Controlled. No changes made to rxs.

## 2016-05-12 NOTE — Assessment & Plan Note (Signed)
At goal for a diabetic. No changes made to rxs today.

## 2016-05-12 NOTE — Assessment & Plan Note (Signed)
Well controlled on current rx. No changes made today. eRx refills for wellbutrin sent.

## 2016-05-12 NOTE — Assessment & Plan Note (Signed)
LDL at goal for a diabetic on current dose of simvastatin. No changes made to rxs today.

## 2016-05-12 NOTE — Assessment & Plan Note (Addendum)
Deteriorated- end stage. Awaiting shoulder replacement in near future. Discussed dangers of high doses of NSAIDs, especially on an empty stomach. Advised scheduled Tylenol twice daily with as needed NSAIDs for breakthrough pain. Tramadol was ineffective in the past. Call or return to clinic prn if these symptoms worsen or fail to improve as anticipated. The patient indicates understanding of these issues and agrees with the plan.

## 2016-05-17 MED FILL — AMOXICILLIN 500 MG CAPSULE: 500 | 4 days supply | Qty: 16 | Fill #0

## 2016-05-20 MED FILL — glipiZIDE ER 2.5 MG TB24: 2.5 | 90 days supply | Qty: 180 | Fill #1

## 2016-05-26 MED FILL — MELOXICAM 15 MG TABLET: 15 | 30 days supply | Qty: 30 | Fill #0

## 2016-06-01 MED FILL — BUPROPION HCL XL 300 MG TAB: 300 | 30 days supply | Qty: 30 | Fill #0

## 2016-06-01 MED FILL — GABAPENTIN 300 MG CAPSULE: 300 | 45 days supply | Qty: 90 | Fill #1

## 2016-06-07 MED FILL — BENAZEPRIL-HCTZ 20-12.5 MG: 20-12.5 | 30 days supply | Qty: 30 | Fill #6

## 2016-06-18 MED FILL — SIMVASTATIN 40 MG TABLET: 40 | 90 days supply | Qty: 90 | Fill #1

## 2016-06-18 MED FILL — VICTOZA 18 MG/3 ML INJECT P: 18 | 30 days supply | Qty: 9 | Fill #1

## 2016-06-22 DIAGNOSIS — H33012 Retinal detachment with single break, left eye: Secondary | ICD-10-CM | POA: Diagnosis not present

## 2016-06-22 DIAGNOSIS — E119 Type 2 diabetes mellitus without complications: Secondary | ICD-10-CM | POA: Diagnosis not present

## 2016-06-28 ENCOUNTER — Other Ambulatory Visit: Payer: Self-pay | Admitting: Family Medicine

## 2016-06-28 DIAGNOSIS — Z1231 Encounter for screening mammogram for malignant neoplasm of breast: Secondary | ICD-10-CM

## 2016-06-29 MED FILL — BUPROPION HCL XL 300 MG TAB: 300 | 30 days supply | Qty: 30 | Fill #1

## 2016-07-01 ENCOUNTER — Other Ambulatory Visit: Payer: Self-pay | Admitting: Internal Medicine

## 2016-07-02 MED FILL — UNIFINE PENTIPS 8MM 31G: 31G X 8 MM | 90 days supply | Qty: 100 | Fill #0

## 2016-07-07 ENCOUNTER — Other Ambulatory Visit: Payer: Self-pay | Admitting: Family Medicine

## 2016-07-07 MED FILL — BENAZEPRIL-HCTZ 20-12.5 MG: 20-12.5 | 30 days supply | Qty: 30 | Fill #0

## 2016-07-07 MED FILL — clonazePAM 0.5 MG TABS: 0.5 | 30 days supply | Qty: 90 | Fill #0

## 2016-07-07 NOTE — Telephone Encounter (Signed)
Rx called in to requested pharmacy 

## 2016-07-07 NOTE — Telephone Encounter (Signed)
Last f/u 05/2016

## 2016-07-19 ENCOUNTER — Other Ambulatory Visit: Payer: Self-pay

## 2016-07-19 MED ORDER — METFORMIN HCL 1000 MG PO TABS: ORAL_TABLET | ORAL | 1 refills | Status: DC

## 2016-07-19 MED FILL — metFORMIN HCL 1000 MG TABS: 1000 | 90 days supply | Qty: 180 | Fill #0

## 2016-07-19 MED FILL — METOPROLOL SUCC ER 25 MG TA: 25 | 90 days supply | Qty: 90 | Fill #2

## 2016-07-19 MED FILL — MELOXICAM 15 MG TABLET: 15 | 30 days supply | Qty: 30 | Fill #1

## 2016-07-19 MED FILL — GABAPENTIN 300 MG CAPSULE: 300 | 45 days supply | Qty: 90 | Fill #2

## 2016-07-20 MED FILL — VICTOZA 18 MG/3 ML INJECT P: 18 | 30 days supply | Qty: 9 | Fill #2

## 2016-07-21 ENCOUNTER — Ambulatory Visit (INDEPENDENT_AMBULATORY_CARE_PROVIDER_SITE_OTHER): Payer: Medicare Other | Admitting: Internal Medicine

## 2016-07-21 ENCOUNTER — Encounter: Payer: Self-pay | Admitting: Internal Medicine

## 2016-07-21 VITALS — BP 130/78 | HR 76 | Ht 59.5 in | Wt 193.0 lb

## 2016-07-21 DIAGNOSIS — E1165 Type 2 diabetes mellitus with hyperglycemia: Secondary | ICD-10-CM | POA: Diagnosis not present

## 2016-07-21 LAB — LIPID PANEL
CHOLESTEROL: 125 mg/dL (ref 0–200)
HDL: 29.7 mg/dL — ABNORMAL LOW (ref >39.00)
LDL Cholesterol: 59 mg/dL (ref 0–99)
NonHDL: 95.16
TRIGLYCERIDES: 180 mg/dL — AB (ref 0.0–149.0)
Total CHOL/HDL Ratio: 4
VLDL: 36 mg/dL (ref 0.0–40.0)

## 2016-07-21 LAB — COMPLETE METABOLIC PANEL WITH GFR
ALBUMIN: 3.9 g/dL (ref 3.6–5.1)
ALK PHOS: 87 U/L (ref 33–130)
ALT: 14 U/L (ref 6–29)
AST: 21 U/L (ref 10–35)
BILIRUBIN TOTAL: 0.7 mg/dL (ref 0.2–1.2)
BUN: 36 mg/dL — ABNORMAL HIGH (ref 7–25)
CALCIUM: 9.2 mg/dL (ref 8.6–10.4)
CHLORIDE: 108 mmol/L (ref 98–110)
CO2: 23 mmol/L (ref 20–31)
CREATININE: 1.26 mg/dL — AB (ref 0.50–0.99)
GFR, EST AFRICAN AMERICAN: 51 mL/min — AB (ref >=60)
GFR, Est Non African American: 44 mL/min — ABNORMAL LOW (ref >=60)
Glucose, Bld: 105 mg/dL — ABNORMAL HIGH (ref 65–99)
Potassium: 5.3 mmol/L (ref 3.5–5.3)
Sodium: 142 mmol/L (ref 135–146)
Total Protein: 5.6 g/dL — ABNORMAL LOW (ref 6.1–8.1)

## 2016-07-21 LAB — POCT GLYCOSYLATED HEMOGLOBIN (HGB A1C): HEMOGLOBIN A1C: 7.4

## 2016-07-21 NOTE — Progress Notes (Signed)
Patient ID: Brenda [name], female   DOB: [birth year], 65 y.o.   MRN: [id number]  HPI: Brenda [name] is a 65 y.o.-year-old female, returning for follow-up for DM2, dx in 2007, non-insulin-dependent, uncontrolled, with complications (mild CKD). Last visit 3 months ago.  She will have shoulder surgery in 08/2016.  Last hemoglobin A1c was: Lab Results  Component Value Date   HGBA1C 6.8 04/23/2016   HGBA1C 6.8 01/13/2016   HGBA1C 6.7 10/15/2015   Pt is on a regimen of: - Metformin 1000 mg 2x a day, with meals - Victoza 1.8 mg daily - started 12/2014 - Glipizide ER 2.5 mg 2x a day before meals. Please crush the dose before dinner. She was on Glipizide in 11/2015 after a steroid inj. At last visit, we stopped Glipizide 5 mg 2x a day, before meals, because of lows.  Pt checks her sugars 3 a day and they are: - am: 55-153 >> 60, 79, 91-140 >> 86-143, 170 >> 83, 96-168, 183, 200 >> 86, 114-152, 158 - 2h after b'fast: 114 >> 105, 129 >> n/c >> 156 - before lunch: 49 x1, 53-136 >> 51, 60-116 >> 53, 58 >> 50, 61x2, 104-108 >> 62x1, 72-127, 170 - 2h after lunch: 71 >> 64-138 >> 64, 81-138 >> n/c - before dinner: 77-143 >> 86, 74-138, 157, 165 >> 69-128, 139 >> 60, 90-142, 163 >> 88-140, 169, 180 - 2h after dinner: n/c >> 147 >> 151 >> 180 >> n/c - bedtime: n/c >> 89-158 >> n/c - nighttime: n/c No lows. Lowest sugar was 49, 50s >> 60 >> 50x1 >> 62; she has hypoglycemia awareness at 70.  Highest sugar was 177 >> 165 >> 200 >> 170  Glucometer: Freestyle Lite  Pt's meals are: - Breakfast: English muffin + preserve or cereal or yoghurt - snack: fruit or fruit + yoghurt - Lunch: 1/2 sandwich + chips + salsa + fruit/sugar free - Dinner: soup or chicken/steak + veggies, occasional starch or Lean cuisine or light salad  - + mild CKD, last BUN/creatinine:  Lab Results  Component Value Date   BUN 25 (H) 07/16/2015   CREATININE 1.16 07/16/2015  On Benazepril. - last set of lipids: Lab  Results  Component Value Date   CHOL 137 07/16/2015   HDL 31.20 (L) 07/16/2015   LDLCALC 68 07/16/2015   TRIG 188.0 (H) 07/16/2015   CHOLHDL 4 07/16/2015  On Simvastatin. - last eye exam was on 03/01/2016. No DR. Cataract sx in 12/2014. - no numbness and tingling in her feet. On Gabapentin for leg cramps at night. On ASA 81.  She has a h/o L TKR and then had a fx of the L hip >> pain in L leg with exercise.   She also has HTN and HL, GERD, depression.  ROS: Constitutional: no weight gain/loss, no fatigue, no subjective hyperthermia/hypothermia Eyes: no blurry vision, no xerophthalmia ENT: no sore throat, no nodules palpated in throat, no dysphagia/odynophagia, no hoarseness Cardiovascular: no CP/SOB/palpitations/L leg swelling (after L knee and hip sx) Respiratory: no cough/SOB Gastrointestinal: no N/V/D/C/heartburn Musculoskeletal: no muscle/+ joint aches (shoulder) Skin: no rashes Neurological: no tremors/numbness/tingling/dizziness  I reviewed pt's medications, allergies, PMH, social hx, family hx, and changes were documented in the history of present illness. Otherwise, unchanged from my initial visit note.  Past Medical History:  Diagnosis Date  . Depression   . Diabetes mellitus   . Endometrial ca ([place])    1998  . GERD (gastroesophageal reflux disease)   . Heart  murmur   . Heel spur   . Hyperlipidemia   . Hypertension   . PONV (postoperative nausea and vomiting)    after hysterectomy   Past Surgical History:  Procedure Laterality Date  . ABDOMINAL HYSTERECTOMY  1998  . FEMUR IM NAIL  10/25/2012   Procedure: INTRAMEDULLARY (IM) NAIL FEMORAL;  Surgeon: Mauri Pole, MD;  [facility];  Service: Orthopedics;  Laterality: Left;  . HIP SURGERY    . left knee arthroscopy  12/2010  . REFRACTIVE SURGERY    . TOTAL KNEE ARTHROPLASTY  12/20/2011   Procedure: TOTAL KNEE ARTHROPLASTY;  Surgeon: Gearlean Alf, MD;  Location[facility];  Service: Orthopedics;   Laterality: Left;  Failed attempt at spinal    Social History   Social History  . Marital Status: Single    Spouse Name: N/A  . Number of Children: 0   Occupational History  . 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 Smoking status: Never Smoker   . Smokeless tobacco: Never Used  . Alcohol Use: No  . 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 aspirin 81 MG tablet Take 81 mg by mouth daily.    . benazepril-hydrochlorthiazide (LOTENSIN HCT) 20-12.5 MG tablet TAKE 1 TABLET BY MOUTH ONCE DAILY 30 tablet 11  . Blood Glucose Monitoring Suppl ([place]) w/Device KIT 1 each by Does not apply route daily. Dx: E11.65 1 kit 0  . buPROPion (WELLBUTRIN XL) 300 MG 24 hr tablet Take 1 tablet (300 mg total) by mouth daily. 30 tablet 6  . Calcium-Magnesium-Vitamin D (CALCIUM 500 PO) Take by mouth 2 (two) times daily.    . cholecalciferol (VITAMIN D) 1000 UNITS tablet Take 2,000 Units by mouth daily.     . clonazePAM (KLONOPIN) 0.5 MG tablet TAKE 1 TABLET BY MOUTH 3 TIMES DAILY AS NEEDED FOR ANXIETY 90 tablet 0  . Esomeprazole Magnesium (NEXIUM PO) Take 22.5 mg by mouth daily.    . fluocinonide-emollient (LIDEX-E) 0.05 % cream Apply 1 application topically as needed. 30 g 0  . gabapentin (NEURONTIN) 300 MG capsule TAKE 1-2 CAPSULES BY MOUTH AS NEEDED AT BEDTIME FOR RLS. 90 capsule 3  . glipiZIDE (GLUCOTROL XL) 2.5 MG 24 hr tablet Take 1 tablet (2.5 mg total) by mouth 2 (two) times daily before a meal. 180 tablet 3  . glucose blood (ONETOUCH VERIO) test strip Use to test blood sugar twice daily as instructed. Dx: E11.65 100 each 4  . Liraglutide (VICTOZA) 18 MG/3ML SOPN Inject 0.3 mLs (1.8 mg total) into the skin daily. 27 mL 3  . meloxicam (MOBIC) 15 MG tablet Take 15 mg by mouth as needed for pain.    . metFORMIN (GLUCOPHAGE) 1000 MG tablet TAKE 1 TABLET BY  MOUTH 2 TIMES DAILY WITH A MEAL. 180 tablet 1  . methocarbamol (ROBAXIN) 500 MG tablet Take 500 mg by mouth as needed for muscle spasms.    . metoprolol succinate (TOPROL-XL) 25 MG 24 hr tablet TAKE 1 TABLET BY MOUTH DAILY. 90 tablet 3  . Multiple Vitamins-Minerals (HM MULTIVITAMIN ADULT GUMMY PO) Take 1 tablet by mouth daily.    Glory Rosebush DELICA LANCETS FINE MISC Use to test blood sugar twice daily as instructed. Dx: E11.65 100 each 4  . simvastatin (ZOCOR) 40 MG tablet TAKE 1  TABLET BY MOUTH AT BEDTIME. 90 tablet 3  . UNIFINE PENTIPS 31G X 8 MM MISC USE AS DIRECTED WITH VICTOZA 100 each 0   No current facility-administered medications on file prior to visit.    No Known Allergies Family History  Problem Relation Age of Onset  . Cancer Mother     breast cancer  . Cancer Father     plasma sarcoma   PE: BP 130/78 (BP Location: Left Arm, Patient Position: Sitting)   Pulse 76   Ht 4' 11.5" (1.511 m)   Wt 193 lb (87.5 kg)   SpO2 97%   BMI 38.33 kg/m  Body mass index is 38.33 kg/m. Wt Readings from Last 3 Encounters:  07/21/16 193 lb (87.5 kg)  05/12/16 195 lb (88.5 kg)  04/23/16 192 lb (87.1 kg)   Constitutional: overweight, truncal obesity, in NAD Eyes: PERRLA, EOMI, no exophthalmos ENT: moist mucous membranes, no thyromegaly, no cervical lymphadenopathy Cardiovascular: RRR, No MRG, + L LE edema Respiratory: CTA B Gastrointestinal: abdomen soft, NT, ND, BS+ Musculoskeletal: no deformities, strength intact in all 4 Skin: moist, warm, + rash on L leg (chronic) Neurological: no tremor with outstretched hands, DTR normal in all 4  ASSESSMENT: 1. DM2, non-insulin-dependent, uncontrolled, with complications and hypeglycemia - mild CKD  PLAN:  1. Patient with long-standing, previously uncontrolled diabetes, now on Metformin, Victoza, Glipizide, with significant improvement in her blood sugars. She had low CBG before lunch >> we switched from regular glipizide to ER at last visit  >> sugars a little better.  - I suggested to:  Patient Instructions  Please continue: - Metformin 1000 mg 2x a day - Victoza 1.8 mg daily in am - Glipizide ER 2.5 mg 2x a day before meals. Please crush the dose before dinner.  Please check some sugars after dinner.  Please return in 3 months with your sugar log.   Please stop at the lab.  - continue checking sugars at different times of the day - check 2 times a day, rotating checks - given more sugar logs  - advised for yearly eye exams. She is UTD. - will check CMP and Lipids today. - checked HbA1c today >> 7.4% (higher!) >> this is not concordant with log >> advised to check some sugars after dinner. - Return to clinic in 3 mo with sugar log   Component     Latest Ref Rng & Units 07/21/2016  Sodium     135 - 146 mmol/L 142  Potassium     3.5 - 5.3 mmol/L 5.3  Chloride     98 - 110 mmol/L 108  CO2     20 - 31 mmol/L 23  Glucose     65 - 99 mg/dL 105 (H)  BUN     7 - 25 mg/dL 36 (H)  Creatinine     0.50 - 0.99 mg/dL 1.26 (H)  Total Bilirubin     0.2 - 1.2 mg/dL 0.7  Alkaline Phosphatase     33 - 130 U/L 87  AST     10 - 35 U/L 21  ALT     6 - 29 U/L 14  Total Protein     6.1 - 8.1 g/dL 5.6 (L)  Albumin     3.6 - 5.1 g/dL 3.9  Calcium     8.6 - 10.4 mg/dL 9.2  GFR, Est African American     >=60 mL/min 51 (L)  GFR, Est Non African American     >=  60 mL/min 44 (L)  Cholesterol     0 - 200 mg/dL 125  Triglycerides     0.0 - 149.0 mg/dL 180.0 (H)  HDL Cholesterol     >39.00 mg/dL 29.70 (L)  VLDL     0.0 - 40.0 mg/dL 36.0  LDL (calc)     0 - 99 mg/dL 59  Total CHOL/HDL Ratio      4  NonHDL      95.16  Hemoglobin A1C      7.4   GFR decreased. I will advise her to stay well hydrated. LDL at goal. Triglycerides higher than target, but this was not a fasting sample. Low serum proteins.  Philemon Kingdom, MD PhD [facility] Endocrinology

## 2016-07-21 NOTE — Patient Instructions (Signed)
Please continue: - Metformin 1000 mg 2x a day - Victoza 1.8 mg daily in am - Glipizide ER 2.5 mg 2x a day before meals. Please crush the dose before dinner.  Please check some sugars after dinner.  Please return in 3 months with your sugar log.   Please stop at the lab.

## 2016-07-28 ENCOUNTER — Ambulatory Visit
Admission: RE | Admit: 2016-07-28 | Discharge: 2016-07-28 | Disposition: A | Discharge status: Home / Self Care | Payer: Medicare Other | Source: Ambulatory Visit | Attending: Family Medicine | Admitting: Family Medicine

## 2016-07-28 DIAGNOSIS — Z1231 Encounter for screening mammogram for malignant neoplasm of breast: Secondary | ICD-10-CM | POA: Diagnosis not present

## 2016-08-03 MED FILL — BUPROPION HCL XL 300 MG TAB: 300 | 30 days supply | Qty: 30 | Fill #2

## 2016-08-05 MED FILL — BENAZEPRIL-HCTZ 20-12.5 MG: 20-12.5 | 30 days supply | Qty: 30 | Fill #1

## 2016-08-12 ENCOUNTER — Encounter (HOSPITAL_COMMUNITY)
Admission: RE | Admit: 2016-08-12 | Discharge: 2016-08-12 | Disposition: A | Discharge status: Home / Self Care | Payer: Medicare Other | Source: Ambulatory Visit | Attending: Orthopedic Surgery | Admitting: Orthopedic Surgery

## 2016-08-12 ENCOUNTER — Encounter (HOSPITAL_COMMUNITY): Payer: Self-pay

## 2016-08-12 DIAGNOSIS — K219 Gastro-esophageal reflux disease without esophagitis: Secondary | ICD-10-CM | POA: Insufficient documentation

## 2016-08-12 DIAGNOSIS — Z8542 Personal history of malignant neoplasm of other parts of uterus: Secondary | ICD-10-CM | POA: Diagnosis not present

## 2016-08-12 DIAGNOSIS — F329 Major depressive disorder, single episode, unspecified: Secondary | ICD-10-CM | POA: Diagnosis not present

## 2016-08-12 DIAGNOSIS — Z01812 Encounter for preprocedural laboratory examination: Secondary | ICD-10-CM | POA: Diagnosis not present

## 2016-08-12 DIAGNOSIS — Z7982 Long term (current) use of aspirin: Secondary | ICD-10-CM | POA: Diagnosis not present

## 2016-08-12 DIAGNOSIS — Z7984 Long term (current) use of oral hypoglycemic drugs: Secondary | ICD-10-CM | POA: Insufficient documentation

## 2016-08-12 DIAGNOSIS — M19011 Primary osteoarthritis, right shoulder: Secondary | ICD-10-CM | POA: Diagnosis not present

## 2016-08-12 DIAGNOSIS — E785 Hyperlipidemia, unspecified: Secondary | ICD-10-CM | POA: Insufficient documentation

## 2016-08-12 DIAGNOSIS — F419 Anxiety disorder, unspecified: Secondary | ICD-10-CM | POA: Insufficient documentation

## 2016-08-12 DIAGNOSIS — E119 Type 2 diabetes mellitus without complications: Secondary | ICD-10-CM | POA: Diagnosis not present

## 2016-08-12 DIAGNOSIS — I10 Essential (primary) hypertension: Secondary | ICD-10-CM | POA: Insufficient documentation

## 2016-08-12 DIAGNOSIS — Z803 Family history of malignant neoplasm of breast: Secondary | ICD-10-CM | POA: Insufficient documentation

## 2016-08-12 DIAGNOSIS — Z79899 Other long term (current) drug therapy: Secondary | ICD-10-CM | POA: Insufficient documentation

## 2016-08-12 HISTORY — DX: Anxiety disorder, unspecified: F41.9

## 2016-08-12 HISTORY — DX: Other specified soft tissue disorders: M79.89

## 2016-08-12 HISTORY — DX: Unspecified fracture of left foot, initial encounter for closed fracture: S92.902A

## 2016-08-12 HISTORY — DX: Unspecified osteoarthritis, unspecified site: M19.90

## 2016-08-12 LAB — BASIC METABOLIC PANEL
ANION GAP: 7 (ref 5–15)
BUN: 39 mg/dL — ABNORMAL HIGH (ref 6–20)
CALCIUM: 9.1 mg/dL (ref 8.9–10.3)
CO2: 22 mmol/L (ref 22–32)
Chloride: 111 mmol/L (ref 101–111)
Creatinine, Ser: 1.48 mg/dL — ABNORMAL HIGH (ref 0.44–1.00)
GFR, EST AFRICAN AMERICAN: 41 mL/min — AB (ref >60)
GFR, EST NON AFRICAN AMERICAN: 35 mL/min — AB (ref >60)
Glucose, Bld: 62 mg/dL — ABNORMAL LOW (ref 65–99)
Potassium: 4.3 mmol/L (ref 3.5–5.1)
SODIUM: 140 mmol/L (ref 135–145)

## 2016-08-12 LAB — CBC
HCT: 37.5 % (ref 36.0–46.0)
HEMOGLOBIN: 11.7 g/dL — AB (ref 12.0–15.0)
MCH: 26.1 pg (ref 26.0–34.0)
MCHC: 31.2 g/dL (ref 30.0–36.0)
MCV: 83.7 fL (ref 78.0–100.0)
Platelets: 231 10*3/uL (ref 150–400)
RBC: 4.48 MIL/uL (ref 3.87–5.11)
RDW: 14 % (ref 11.5–15.5)
WBC: 12.8 10*3/uL — AB (ref 4.0–10.5)

## 2016-08-12 LAB — SURGICAL PCR SCREEN
MRSA, PCR: NEGATIVE
Staphylococcus aureus: NEGATIVE

## 2016-08-12 LAB — GLUCOSE, CAPILLARY: GLUCOSE-CAPILLARY: 57 mg/dL — AB (ref 65–99)

## 2016-08-12 NOTE — Progress Notes (Signed)
PCP - Dr. Arnette Norris Cardiologist - Dr. Rockey Situ Endocrinologist - Dr. Cruzita Lederer  EKG - 04/01/16 CXR - denies Echo/stress test - states they were greater than 5 years ago Cardiac Cath - denies  Patient denies chest pain and shortness of breath at PAT appointment.    Patient states that she checks her blood sugar at least twice daily and that her morning fasting glucose is 110's-150's and that her evening glucose is 70-80's.  Patient's last A1C was 7.4 on 07/21/16.

## 2016-08-12 NOTE — Pre-Procedure Instructions (Signed)
Brenda [name]  08/12/2016      [place], [place] - [address] [facility]. [address] [place] [address] Phone: [phone] Fax: [phone]  [facility] [place], [place] [address] Phone: [phone] Fax: [phone]    Your procedure is scheduled on Thursday, October 19th, 2017.  Report to [facility] Admitting at 5:30 A.M.   Call this number if you have problems the morning of surgery:  [phone]   Remember:  Do not eat food or drink liquids after midnight.   Take these medicines the morning of surgery with A SIP OF WATER: Bupropion (Wellbutrin), Clonazepam (Klonopin), Esomeprazole Magnesium (Nexium), Methocarbamol (Robaxin), Metoprolol Succinate (Toprol-XL).  Stop taking: Aspirin, NSAIDS, Meloxicam (Mobic), Ibuprofen, Advil, Motrin, BC's, Goody's, Fish oil, all herbal medications, and all vitamins.    WHAT DO I DO ABOUT MY DIABETES MEDICATION?  Marland Kitchen Do not take oral diabetes medicines (pills) the morning of surgery.  Do NOT take Metformin or Glipizide the day of surgery.    . THE NIGHT BEFORE SURGERY, do NOT take evening dose of Glipizide.       . The day of surgery, do not take other diabetes injectables, including Byetta (exenatide), Bydureon (exenatide ER), Victoza (liraglutide), or Trulicity (dulaglutide).  Do NOT take Victoza the morning of surgery.     How to Manage Your Diabetes Before and After Surgery  Why is it important to control my blood sugar before and after surgery? . Improving blood sugar levels before and after surgery helps healing and can limit problems. . A way of improving blood sugar control is eating a healthy diet by: o  Eating less sugar and carbohydrates o  Increasing activity/exercise o  Talking with your doctor about reaching your blood sugar goals . High blood  sugars (greater than 180 mg/dL) can raise your risk of infections and slow your recovery, so you will need to focus on controlling your diabetes during the weeks before surgery. . Make sure that the doctor who takes care of your diabetes knows about your planned surgery including the date and location.  How do I manage my blood sugar before surgery? . Check your blood sugar at least 4 times a day, starting 2 days before surgery, to make sure that the level is not too high or low. o Check your blood sugar the morning of your surgery when you wake up and every 2 hours until you get to the Short Stay unit. . If your blood sugar is less than 70 mg/dL, you will need to treat for low blood sugar: o Do not take insulin. o Treat a low blood sugar (less than 70 mg/dL) with  cup of clear juice (cranberry or apple), 4 glucose tablets, OR glucose gel. o Recheck blood sugar in 15 minutes after treatment (to make sure it is greater than 70 mg/dL). If your blood sugar is not greater than 70 mg/dL on recheck, call [phone] for further instructions. . Report your blood sugar to the short stay nurse when you get to Short Stay.  . If you are admitted to the hospital after surgery: o Your blood sugar will be checked by the staff and you will probably be given insulin after surgery (instead of oral diabetes medicines) to make sure you have good blood sugar levels. o The goal for blood sugar  control after surgery is 80-180 mg/dL.    Do not wear jewelry, make-up or nail polish.  Do not wear lotions, powders, or perfumes, or deoderant.  Do not shave 48 hours prior to surgery.     Do not bring valuables to the hospital.  [facility] is not responsible for any belongings or valuables.  Contacts, dentures or bridgework may not be worn into surgery.  Leave your suitcase in the car.  After surgery it may be brought to your room.  For patients admitted to the hospital, discharge time will be determined by your  treatment team.  Patients discharged the day of surgery will not be allowed to drive home.   Special instructions:  Preparing for Surgery.   Please read over the following fact sheets that you were given. MRSA Information    [facility]- Preparing For Surgery  Before surgery, you can play an important role. Because skin is not sterile, your skin needs to be as free of germs as possible. You can reduce the number of germs on your skin by washing with CHG (chlorahexidine gluconate) Soap before surgery.  CHG is an antiseptic cleaner which kills germs and bonds with the skin to continue killing germs even after washing.  Please do not use if you have an allergy to CHG or antibacterial soaps. If your skin becomes reddened/irritated stop using the CHG.  Do not shave (including legs and underarms) for at least 48 hours prior to first CHG shower. It is OK to shave your face.  Please follow these instructions carefully.   1. Shower the NIGHT BEFORE SURGERY and the MORNING OF SURGERY with CHG.   2. If you chose to wash your hair, wash your hair first as usual with your normal shampoo.  3. After you shampoo, rinse your hair and body thoroughly to remove the shampoo.  4. Use CHG as you would any other liquid soap. You can apply CHG directly to the skin and wash gently with a scrungie or a clean washcloth.   5. Apply the CHG Soap to your body ONLY FROM THE NECK DOWN.  Do not use on open wounds or open sores. Avoid contact with your eyes, ears, mouth and genitals (private parts). Wash genitals (private parts) with your normal soap.  6. Wash thoroughly, paying special attention to the area where your surgery will be performed.  7. Thoroughly rinse your body with warm water from the neck down.  8. DO NOT shower/wash with your normal soap after using and rinsing off the CHG Soap.  9. Pat yourself dry with a CLEAN TOWEL.   10. Wear CLEAN PAJAMAS   11. Place CLEAN SHEETS on your bed the night of  your first shower and DO NOT SLEEP WITH PETS.  Day of Surgery: Do not apply any deodorants/lotions. Please wear clean clothes to the hospital/surgery center.

## 2016-08-12 NOTE — Progress Notes (Signed)
Patient's blood sugar 57 and encourage patient to eat lunch.  Patient had 1/2 of a muffin.

## 2016-08-16 ENCOUNTER — Telehealth: Payer: Self-pay | Admitting: Family Medicine

## 2016-08-16 MED FILL — VICTOZA 18 MG/3 ML INJECT P: 18 | 30 days supply | Qty: 9 | Fill #3

## 2016-08-16 MED FILL — glipiZIDE ER 2.5 MG TB24: 2.5 | 90 days supply | Qty: 180 | Fill #2

## 2016-08-18 MED ORDER — CEFAZOLIN SODIUM-DEXTROSE 2-4 GM/100ML-% IV SOLN
2.0000 g | INTRAVENOUS | Status: DC
  Filled 2016-08-18: qty 100

## 2016-08-18 MED ORDER — TRANEXAMIC ACID 1000 MG/10ML IV SOLN
1000.0000 mg | INTRAVENOUS | Status: AC
  Administered 2016-08-19: 1000 mg via INTRAVENOUS
  Filled 2016-08-18: qty 10

## 2016-08-18 MED FILL — clonazePAM 0.5 MG TABS: 0.5 | 30 days supply | Qty: 90 | Fill #0

## 2016-08-18 NOTE — Telephone Encounter (Signed)
I did not receive this Rx response. I just called it in to [facility] at pts request

## 2016-08-18 NOTE — Telephone Encounter (Signed)
Pt left v/m requesting status of clonazepam refill. Was the rx faxed to pharmacy. Pt is having surgery on 08/19/16 and pt request cb today.

## 2016-08-18 NOTE — Anesthesia Preprocedure Evaluation (Signed)
Anesthesia Evaluation  Patient identified by MRN, date of birth, ID band Patient awake    Reviewed: Allergy & Precautions, H&P , NPO status , Patient's Chart, lab work & pertinent test results  History of Anesthesia Complications (+) PONV and history of anesthetic complications  Airway Mallampati: II  TM Distance: >3 FB Neck ROM: Full    Dental no notable dental hx. (+) Dental Advisory Given, Teeth Intact   Pulmonary neg pulmonary ROS,    Pulmonary exam normal breath sounds clear to auscultation       Cardiovascular hypertension, Pt. on medications (-) Past MI and (-) CHF + Valvular Problems/Murmurs  Rhythm:Regular Rate:Normal     Neuro/Psych PSYCHIATRIC DISORDERS Depression negative neurological ROS     GI/Hepatic Neg liver ROS, GERD  Medicated,  Endo/Other  diabetes, Type 2, Oral Hypoglycemic AgentsMorbid obesity  Renal/GU negative Renal ROS  negative genitourinary   Musculoskeletal  (+) Arthritis ,   Abdominal (+) + obese,   Peds negative pediatric ROS (+)  Hematology negative hematology ROS (+)   Anesthesia Other Findings   Reproductive/Obstetrics negative OB ROS                             Anesthesia Physical  Anesthesia Plan  ASA: III  Anesthesia Plan: General and Regional   Post-op Pain Management: GA combined w/ Regional for post-op pain   Induction: Intravenous  Airway Management Planned: Oral ETT  Additional Equipment:   Intra-op Plan:   Post-operative Plan: Extubation in OR  Informed Consent: I have reviewed the patients History and Physical, chart, labs and discussed the procedure including the risks, benefits and alternatives for the proposed anesthesia with the patient or authorized representative who has indicated his/her understanding and acceptance.   Dental advisory given  Plan Discussed with: CRNA  Anesthesia Plan Comments:         Anesthesia  Quick Evaluation                                   Anesthesia Evaluation  Patient identified by MRN, date of birth, ID band Patient awake    Reviewed: Allergy & Precautions, H&P , NPO status , Patient's Chart, lab work & pertinent test results  History of Anesthesia Complications (+) PONV  Airway Mallampati: II TM Distance: >3 FB Neck ROM: Full    Dental No notable dental hx.    Pulmonary neg pulmonary ROS,  clear to auscultation  Pulmonary exam normal       Cardiovascular hypertension, Pt. on medications + Valvular Problems/Murmurs Regular Normal    Neuro/Psych Negative Neurological ROS  Negative Psych ROS   GI/Hepatic Neg liver ROS, GERD-  Medicated,  Endo/Other  Diabetes mellitus-, Type 2, Oral Hypoglycemic Agents  Renal/GU negative Renal ROS  Genitourinary negative   Musculoskeletal negative musculoskeletal ROS (+)   Abdominal (+) obese,   Peds negative pediatric ROS (+)  Hematology negative hematology ROS (+)   Anesthesia Other Findings   Reproductive/Obstetrics negative OB ROS                           Anesthesia Physical Anesthesia Plan  ASA: III  Anesthesia Plan: Spinal   Post-op Pain Management:    Induction: Intravenous  Airway Management Planned: Simple Face Mask  Additional Equipment:   Intra-op Plan:   Post-operative Plan: Extubation in OR  Informed Consent: I have reviewed the patients History and Physical, chart, labs and discussed the procedure including the risks, benefits and alternatives for the proposed anesthesia with the patient or authorized representative who has indicated his/her understanding and acceptance.   Dental advisory given  Plan Discussed with: CRNA  Anesthesia Plan Comments: (Discussed risks/benefits of spinal including headache, backache, failure, bleeding, infection, and nerve damage. Patient consents to spinal. Questions answered. Coagulation studies and platelet count  acceptable. )      Anesthesia Quick Evaluation

## 2016-08-19 ENCOUNTER — Inpatient Hospital Stay (HOSPITAL_COMMUNITY): Payer: Medicare Other | Admitting: Anesthesiology

## 2016-08-19 ENCOUNTER — Encounter (HOSPITAL_COMMUNITY): Payer: Self-pay | Admitting: *Deleted

## 2016-08-19 ENCOUNTER — Inpatient Hospital Stay (HOSPITAL_COMMUNITY)
Admission: RE | Admit: 2016-08-19 | Discharge: 2016-08-20 | DRG: 483 | Disposition: A | Discharge status: Home / Self Care | Payer: Medicare Other | Source: Ambulatory Visit | Attending: Orthopedic Surgery | Admitting: Orthopedic Surgery

## 2016-08-19 ENCOUNTER — Encounter (HOSPITAL_COMMUNITY)
Admission: RE | Disposition: A | Discharge status: Home / Self Care | Payer: Self-pay | Source: Ambulatory Visit | Attending: Orthopedic Surgery

## 2016-08-19 DIAGNOSIS — E119 Type 2 diabetes mellitus without complications: Secondary | ICD-10-CM | POA: Diagnosis not present

## 2016-08-19 DIAGNOSIS — Z7984 Long term (current) use of oral hypoglycemic drugs: Secondary | ICD-10-CM

## 2016-08-19 DIAGNOSIS — Z7982 Long term (current) use of aspirin: Secondary | ICD-10-CM | POA: Diagnosis not present

## 2016-08-19 DIAGNOSIS — E785 Hyperlipidemia, unspecified: Secondary | ICD-10-CM | POA: Diagnosis present

## 2016-08-19 DIAGNOSIS — F329 Major depressive disorder, single episode, unspecified: Secondary | ICD-10-CM | POA: Diagnosis present

## 2016-08-19 DIAGNOSIS — I10 Essential (primary) hypertension: Secondary | ICD-10-CM | POA: Diagnosis present

## 2016-08-19 DIAGNOSIS — Z6837 Body mass index (BMI) 37.0-37.9, adult: Secondary | ICD-10-CM

## 2016-08-19 DIAGNOSIS — M19011 Primary osteoarthritis, right shoulder: Secondary | ICD-10-CM | POA: Diagnosis not present

## 2016-08-19 DIAGNOSIS — Z96612 Presence of left artificial shoulder joint: Secondary | ICD-10-CM

## 2016-08-19 DIAGNOSIS — K219 Gastro-esophageal reflux disease without esophagitis: Secondary | ICD-10-CM | POA: Diagnosis present

## 2016-08-19 DIAGNOSIS — G8918 Other acute postprocedural pain: Secondary | ICD-10-CM | POA: Diagnosis not present

## 2016-08-19 DIAGNOSIS — Z90710 Acquired absence of both cervix and uterus: Secondary | ICD-10-CM

## 2016-08-19 DIAGNOSIS — Z803 Family history of malignant neoplasm of breast: Secondary | ICD-10-CM | POA: Diagnosis not present

## 2016-08-19 DIAGNOSIS — Z96611 Presence of right artificial shoulder joint: Secondary | ICD-10-CM

## 2016-08-19 DIAGNOSIS — Z8542 Personal history of malignant neoplasm of other parts of uterus: Secondary | ICD-10-CM

## 2016-08-19 HISTORY — PX: TOTAL SHOULDER ARTHROPLASTY: SHX126

## 2016-08-19 LAB — GLUCOSE, CAPILLARY
Glucose-Capillary: 131 mg/dL — ABNORMAL HIGH (ref 65–99)
Glucose-Capillary: 151 mg/dL — ABNORMAL HIGH (ref 65–99)
Glucose-Capillary: 149 mg/dL — ABNORMAL HIGH (ref 65–99)
Glucose-Capillary: 134 mg/dL — ABNORMAL HIGH (ref 65–99)
Glucose-Capillary: 198 mg/dL — ABNORMAL HIGH (ref 65–99)

## 2016-08-19 SURGERY — ARTHROPLASTY, SHOULDER, TOTAL
Anesthesia: Regional | Site: Shoulder | Laterality: Right

## 2016-08-19 MED ORDER — BENAZEPRIL HCL 20 MG PO TABS
20.0000 mg | ORAL_TABLET | Freq: Every day | ORAL | Status: DC
  Administered 2016-08-20: 20 mg via ORAL
  Filled 2016-08-19: qty 1

## 2016-08-19 MED ORDER — ARTIFICIAL TEARS OP OINT
TOPICAL_OINTMENT | OPHTHALMIC | Status: DC | PRN
  Administered 2016-08-19: 1 via OPHTHALMIC

## 2016-08-19 MED ORDER — SUCCINYLCHOLINE CHLORIDE 20 MG/ML IJ SOLN
INTRAMUSCULAR | Status: DC | PRN
  Administered 2016-08-19: 60 mg via INTRAVENOUS

## 2016-08-19 MED ORDER — FENTANYL CITRATE (PF) 100 MCG/2ML IJ SOLN
INTRAMUSCULAR | Status: DC | PRN
  Administered 2016-08-19: 50 ug via INTRAVENOUS

## 2016-08-19 MED ORDER — BENAZEPRIL-HYDROCHLOROTHIAZIDE 20-12.5 MG PO TABS: 1.0000 | ORAL_TABLET | Freq: Every day | ORAL | Status: DC

## 2016-08-19 MED ORDER — GABAPENTIN 300 MG PO CAPS: 300.0000 mg | ORAL_CAPSULE | Freq: Two times a day (BID) | ORAL | Status: DC | PRN

## 2016-08-19 MED ORDER — ASPIRIN 81 MG PO CHEW
81.0000 mg | CHEWABLE_TABLET | Freq: Every day | ORAL | Status: DC
  Administered 2016-08-20: 81 mg via ORAL
  Filled 2016-08-19: qty 1

## 2016-08-19 MED ORDER — CEFAZOLIN SODIUM-DEXTROSE 2-3 GM-% IV SOLR
INTRAVENOUS | Status: DC | PRN
  Administered 2016-08-19: 2 g via INTRAVENOUS

## 2016-08-19 MED ORDER — HYDROCHLOROTHIAZIDE 12.5 MG PO CAPS
12.5000 mg | ORAL_CAPSULE | Freq: Every day | ORAL | Status: DC
  Administered 2016-08-20: 12.5 mg via ORAL
  Filled 2016-08-19: qty 1

## 2016-08-19 MED ORDER — MENTHOL 3 MG MT LOZG: 1.0000 | LOZENGE | OROMUCOSAL | Status: DC | PRN

## 2016-08-19 MED ORDER — HYDROMORPHONE HCL 2 MG/ML IJ SOLN: 1.0000 mg | INTRAMUSCULAR | Status: DC | PRN

## 2016-08-19 MED ORDER — CHLORHEXIDINE GLUCONATE 4 % EX LIQD: 60.0000 mL | Freq: Once | CUTANEOUS | Status: DC

## 2016-08-19 MED ORDER — METOPROLOL SUCCINATE ER 25 MG PO TB24
25.0000 mg | ORAL_TABLET | Freq: Every day | ORAL | Status: DC
  Administered 2016-08-20: 25 mg via ORAL
  Filled 2016-08-19: qty 1

## 2016-08-19 MED ORDER — ROCURONIUM BROMIDE 100 MG/10ML IV SOLN
INTRAVENOUS | Status: DC | PRN
  Administered 2016-08-19: 30 mg via INTRAVENOUS

## 2016-08-19 MED ORDER — LACTATED RINGERS IV SOLN
INTRAVENOUS | Status: DC | PRN
  Administered 2016-08-19: 07:00:00 via INTRAVENOUS

## 2016-08-19 MED ORDER — ALUM & MAG HYDROXIDE-SIMETH 200-200-20 MG/5ML PO SUSP: 30.0000 mL | ORAL | Status: DC | PRN

## 2016-08-19 MED ORDER — MIDAZOLAM HCL 5 MG/5ML IJ SOLN
INTRAMUSCULAR | Status: DC | PRN
Start: 2016-08-19 — End: 2016-08-19
  Administered 2016-08-19: 2 mg via INTRAVENOUS

## 2016-08-19 MED ORDER — INSULIN ASPART 100 UNIT/ML ~~LOC~~ SOLN
0.0000 [IU] | Freq: Three times a day (TID) | SUBCUTANEOUS | Status: DC
  Administered 2016-08-19: 2 [IU] via SUBCUTANEOUS
  Administered 2016-08-20: 3 [IU] via SUBCUTANEOUS

## 2016-08-19 MED ORDER — FENTANYL CITRATE (PF) 100 MCG/2ML IJ SOLN
INTRAMUSCULAR | Status: AC
  Filled 2016-08-19: qty 2

## 2016-08-19 MED ORDER — ONDANSETRON HCL 4 MG PO TABS: 4.0000 mg | ORAL_TABLET | Freq: Four times a day (QID) | ORAL | Status: DC | PRN

## 2016-08-19 MED ORDER — PHENYLEPHRINE HCL 10 MG/ML IJ SOLN
INTRAVENOUS | Status: DC | PRN
  Administered 2016-08-19: 50 ug/min via INTRAVENOUS

## 2016-08-19 MED ORDER — BUPIVACAINE-EPINEPHRINE (PF) 0.5% -1:200000 IJ SOLN
INTRAMUSCULAR | Status: DC | PRN
  Administered 2016-08-19: 30 mL via PERINEURAL

## 2016-08-19 MED ORDER — OXYCODONE HCL 5 MG PO TABS
5.0000 mg | ORAL_TABLET | ORAL | Status: DC | PRN
  Administered 2016-08-19 – 2016-08-20 (×3): 10 mg via ORAL
  Filled 2016-08-19 (×3): qty 2

## 2016-08-19 MED ORDER — METOCLOPRAMIDE HCL 5 MG/ML IJ SOLN: 5.0000 mg | Freq: Three times a day (TID) | INTRAMUSCULAR | Status: DC | PRN

## 2016-08-19 MED ORDER — CEFAZOLIN SODIUM-DEXTROSE 2-4 GM/100ML-% IV SOLN
2.0000 g | Freq: Four times a day (QID) | INTRAVENOUS | Status: AC
  Administered 2016-08-19 – 2016-08-20 (×3): 2 g via INTRAVENOUS
  Filled 2016-08-19 (×4): qty 100

## 2016-08-19 MED ORDER — KETOROLAC TROMETHAMINE 15 MG/ML IJ SOLN
7.5000 mg | Freq: Four times a day (QID) | INTRAMUSCULAR | Status: DC
  Administered 2016-08-19 – 2016-08-20 (×3): 7.5 mg via INTRAVENOUS
  Filled 2016-08-19 (×3): qty 1

## 2016-08-19 MED ORDER — CLONAZEPAM 0.5 MG PO TABS: 0.5000 mg | ORAL_TABLET | Freq: Three times a day (TID) | ORAL | Status: DC | PRN

## 2016-08-19 MED ORDER — SUGAMMADEX SODIUM 200 MG/2ML IV SOLN
INTRAVENOUS | Status: DC | PRN
  Administered 2016-08-19: 150 mg via INTRAVENOUS

## 2016-08-19 MED ORDER — PHENOL 1.4 % MT LIQD: 1.0000 | OROMUCOSAL | Status: DC | PRN

## 2016-08-19 MED ORDER — DOCUSATE SODIUM 100 MG PO CAPS
100.0000 mg | ORAL_CAPSULE | Freq: Two times a day (BID) | ORAL | Status: DC
  Administered 2016-08-19 – 2016-08-20 (×2): 100 mg via ORAL
  Filled 2016-08-19 (×2): qty 1

## 2016-08-19 MED ORDER — LIDOCAINE HCL 4 % EX SOLN
CUTANEOUS | Status: DC | PRN
  Administered 2016-08-19: 3 mL via TOPICAL

## 2016-08-19 MED ORDER — LACTATED RINGERS IV SOLN
INTRAVENOUS | Status: DC
  Administered 2016-08-19: 10:00:00 via INTRAVENOUS

## 2016-08-19 MED ORDER — ONDANSETRON HCL 4 MG/2ML IJ SOLN
INTRAMUSCULAR | Status: DC | PRN
  Administered 2016-08-19: 4 mg via INTRAVENOUS

## 2016-08-19 MED ORDER — FLEET ENEMA 7-19 GM/118ML RE ENEM: 1.0000 | ENEMA | Freq: Once | RECTAL | Status: DC | PRN

## 2016-08-19 MED ORDER — MIDAZOLAM HCL 2 MG/2ML IJ SOLN
INTRAMUSCULAR | Status: AC
  Filled 2016-08-19: qty 2

## 2016-08-19 MED ORDER — BUPROPION HCL ER (XL) 150 MG PO TB24
300.0000 mg | ORAL_TABLET | Freq: Every day | ORAL | Status: DC
  Administered 2016-08-20: 300 mg via ORAL
  Filled 2016-08-19: qty 2

## 2016-08-19 MED ORDER — ACETAMINOPHEN 325 MG PO TABS: 650.0000 mg | ORAL_TABLET | Freq: Four times a day (QID) | ORAL | Status: DC | PRN

## 2016-08-19 MED ORDER — PROPOFOL 10 MG/ML IV BOLUS
INTRAVENOUS | Status: AC
  Filled 2016-08-19: qty 40

## 2016-08-19 MED ORDER — 0.9 % SODIUM CHLORIDE (POUR BTL) OPTIME
TOPICAL | Status: DC | PRN
  Administered 2016-08-19: 1000 mL

## 2016-08-19 MED ORDER — POLYETHYLENE GLYCOL 3350 17 G PO PACK: 17.0000 g | PACK | Freq: Every day | ORAL | Status: DC | PRN

## 2016-08-19 MED ORDER — ACETAMINOPHEN 650 MG RE SUPP: 650.0000 mg | Freq: Four times a day (QID) | RECTAL | Status: DC | PRN

## 2016-08-19 MED ORDER — METHOCARBAMOL 500 MG PO TABS: 500.0000 mg | ORAL_TABLET | ORAL | Status: DC | PRN

## 2016-08-19 MED ORDER — PROPOFOL 10 MG/ML IV BOLUS
INTRAVENOUS | Status: DC | PRN
  Administered 2016-08-19: 150 mg via INTRAVENOUS

## 2016-08-19 MED ORDER — FENTANYL CITRATE (PF) 100 MCG/2ML IJ SOLN: 25.0000 ug | INTRAMUSCULAR | Status: DC | PRN

## 2016-08-19 MED ORDER — BISACODYL 5 MG PO TBEC: 5.0000 mg | DELAYED_RELEASE_TABLET | Freq: Every day | ORAL | Status: DC | PRN

## 2016-08-19 MED ORDER — ONDANSETRON HCL 4 MG/2ML IJ SOLN
4.0000 mg | Freq: Four times a day (QID) | INTRAMUSCULAR | Status: DC | PRN
  Administered 2016-08-19: 4 mg via INTRAVENOUS
  Filled 2016-08-19: qty 2

## 2016-08-19 MED ORDER — LIDOCAINE HCL (CARDIAC) 20 MG/ML IV SOLN
INTRAVENOUS | Status: DC | PRN
  Administered 2016-08-19: 60 mg via INTRAVENOUS

## 2016-08-19 MED ORDER — ONDANSETRON HCL 4 MG/2ML IJ SOLN: 4.0000 mg | Freq: Once | INTRAMUSCULAR | Status: DC | PRN

## 2016-08-19 MED ORDER — METOCLOPRAMIDE HCL 5 MG PO TABS
5.0000 mg | ORAL_TABLET | Freq: Three times a day (TID) | ORAL | Status: DC | PRN
  Administered 2016-08-19 – 2016-08-20 (×2): 5 mg via ORAL
  Filled 2016-08-19 (×2): qty 1

## 2016-08-19 MED ORDER — DIPHENHYDRAMINE HCL 12.5 MG/5ML PO ELIX: 12.5000 mg | ORAL_SOLUTION | ORAL | Status: DC | PRN

## 2016-08-19 SURGICAL SUPPLY — 62 items
BIT DRILL 5/64X5 DISP (BIT) ×2 IMPLANT
BLADE SAW SGTL 83.5X18.5 (BLADE) ×2 IMPLANT
CEMENT BONE DEPUY (Cement) ×2 IMPLANT
COVER SURGICAL LIGHT HANDLE (MISCELLANEOUS) ×2 IMPLANT
DERMABOND ADHESIVE PROPEN (GAUZE/BANDAGES/DRESSINGS) ×1
DERMABOND ADVANCED .7 DNX6 (GAUZE/BANDAGES/DRESSINGS) ×1 IMPLANT
DRAPE ORTHO SPLIT 77X108 STRL (DRAPES) ×2
DRAPE SURG 17X11 SM STRL (DRAPES) ×2 IMPLANT
DRAPE SURG ORHT 6 SPLT 77X108 (DRAPES) ×2 IMPLANT
DRAPE U-SHAPE 47X51 STRL (DRAPES) ×2 IMPLANT
DRESSING AQUACEL AG SP 3.5X6 (GAUZE/BANDAGES/DRESSINGS) ×1 IMPLANT
DRSG AQUACEL AG ADV 3.5X10 (GAUZE/BANDAGES/DRESSINGS) ×2 IMPLANT
DRSG AQUACEL AG SP 3.5X6 (GAUZE/BANDAGES/DRESSINGS) ×2
DURAPREP 26ML APPLICATOR (WOUND CARE) ×2 IMPLANT
ELECT BLADE 4.0 EZ CLEAN MEGAD (MISCELLANEOUS) ×2
ELECT CAUTERY BLADE 6.4 (BLADE) ×2 IMPLANT
ELECT REM PT RETURN 9FT ADLT (ELECTROSURGICAL) ×2
ELECTRODE BLDE 4.0 EZ CLN MEGD (MISCELLANEOUS) ×1 IMPLANT
ELECTRODE REM PT RTRN 9FT ADLT (ELECTROSURGICAL) ×1 IMPLANT
FACESHIELD WRAPAROUND (MASK) ×6 IMPLANT
GLENOID WITH CLEAT MEDIUM (Shoulder) ×2 IMPLANT
GLOVE BIO SURGEON STRL SZ7.5 (GLOVE) ×2 IMPLANT
GLOVE BIO SURGEON STRL SZ8 (GLOVE) ×2 IMPLANT
GLOVE BIOGEL PI IND STRL 6.5 (GLOVE) ×1 IMPLANT
GLOVE BIOGEL PI INDICATOR 6.5 (GLOVE) ×1
GLOVE EUDERMIC 7 POWDERFREE (GLOVE) ×2 IMPLANT
GLOVE SS BIOGEL STRL SZ 7.5 (GLOVE) ×1 IMPLANT
GLOVE SUPERSENSE BIOGEL SZ 7.5 (GLOVE) ×1
GLOVE SURG SS PI 6.0 STRL IVOR (GLOVE) ×2 IMPLANT
GLOVE SURG SS PI 6.5 STRL IVOR (GLOVE) ×4 IMPLANT
GOWN STRL REUS W/ TWL LRG LVL3 (GOWN DISPOSABLE) ×1 IMPLANT
GOWN STRL REUS W/ TWL XL LVL3 (GOWN DISPOSABLE) ×2 IMPLANT
GOWN STRL REUS W/TWL LRG LVL3 (GOWN DISPOSABLE) ×1
GOWN STRL REUS W/TWL XL LVL3 (GOWN DISPOSABLE) ×2
HEAD HUMERAL UNIV 46/20 (Head) ×2 IMPLANT
KIT BASIN OR (CUSTOM PROCEDURE TRAY) ×2 IMPLANT
KIT ROOM TURNOVER OR (KITS) ×2 IMPLANT
KIT SET UNIVERSAL (KITS) ×2 IMPLANT
MANIFOLD NEPTUNE II (INSTRUMENTS) ×2 IMPLANT
NDL SUT .5 MAYO 1.404X.05X (NEEDLE) ×1 IMPLANT
NDL SUT 6 .5 CRC .975X.05 MAYO (NEEDLE) ×1 IMPLANT
NEEDLE MAYO TAPER (NEEDLE) ×2
NS IRRIG 1000ML POUR BTL (IV SOLUTION) ×2 IMPLANT
PACK SHOULDER (CUSTOM PROCEDURE TRAY) ×2 IMPLANT
PAD ARMBOARD 7.5X6 YLW CONV (MISCELLANEOUS) ×2 IMPLANT
RESTRAINT HEAD UNIVERSAL NS (MISCELLANEOUS) ×2 IMPLANT
SLING ARM FOAM STRAP MED (SOFTGOODS) ×2 IMPLANT
SMARTMIX MINI TOWER (MISCELLANEOUS) ×2
SPONGE LAP 18X18 X RAY DECT (DISPOSABLE) ×2 IMPLANT
SPONGE LAP 4X18 X RAY DECT (DISPOSABLE) ×2 IMPLANT
STEM HUMERAL UNI APEX 10MM (Shoulder) ×2 IMPLANT
SUCTION FRAZIER HANDLE 10FR (MISCELLANEOUS) ×1
SUCTION TUBE FRAZIER 10FR DISP (MISCELLANEOUS) ×1 IMPLANT
SUT FIBERWIRE #2 38 T-5 BLUE (SUTURE) ×8
SUT MNCRL AB 3-0 PS2 18 (SUTURE) ×2 IMPLANT
SUT MON AB 2-0 CT1 36 (SUTURE) ×2 IMPLANT
SUT VIC AB 1 CT1 27 (SUTURE) ×1
SUT VIC AB 1 CT1 27XBRD ANBCTR (SUTURE) ×1 IMPLANT
SUTURE FIBERWR #2 38 T-5 BLUE (SUTURE) ×4 IMPLANT
TOWEL OR 17X24 6PK STRL BLUE (TOWEL DISPOSABLE) ×2 IMPLANT
TOWEL OR 17X26 10 PK STRL BLUE (TOWEL DISPOSABLE) ×2 IMPLANT
TOWER SMARTMIX MINI (MISCELLANEOUS) ×1 IMPLANT

## 2016-08-19 NOTE — Anesthesia Procedure Notes (Signed)
Procedure Name: Intubation Date/Time: 08/19/2016 7:37 AM Performed by: Suzy Bouchard Pre-anesthesia Checklist: Patient identified, Emergency Drugs available, Suction available, Patient being monitored and Timeout performed Patient Re-evaluated:Patient Re-evaluated prior to inductionOxygen Delivery Method: Circle system utilized Preoxygenation: Pre-oxygenation with 100% oxygen Intubation Type: IV induction Ventilation: Mask ventilation without difficulty and Oral airway inserted - appropriate to patient size Laryngoscope Size: Sabra Heck and 2 Grade View: Grade I Tube type: Oral Tube size: 7.0 mm Number of attempts: 1 Airway Equipment and Method: Stylet and LTA kit utilized Placement Confirmation: ETT inserted through vocal cords under direct vision,  breath sounds checked- equal and bilateral and positive ETCO2 Secured at: 21 cm Tube secured with: Tape Dental Injury: Teeth and Oropharynx as per pre-operative assessment

## 2016-08-19 NOTE — Anesthesia Procedure Notes (Signed)
Anesthesia Regional Block:  Interscalene brachial plexus block  Pre-Anesthetic Checklist: ,, timeout performed, Correct Patient, Correct Site, Correct Laterality, Correct Procedure, Correct Position, site marked, Risks and benefits discussed,  Surgical consent,  Pre-op evaluation,  At surgeon's request and post-op pain management  Laterality: Right  Prep: chloraprep       Needles:  Injection technique: Single-shot  Needle Type: Stimiplex     Needle Length: 5cm 5 cm Needle Gauge: 21 G    Additional Needles:  Procedures: ultrasound guided (picture in chart) Interscalene brachial plexus block Narrative:  Injection made incrementally with aspirations every 5 mL.  Performed by: Personally  Anesthesiologist: Odai Wimmer  Additional Notes: Risks, benefits and alternative to block explained extensively.  Patient tolerated procedure well, without complications.

## 2016-08-19 NOTE — Transfer of Care (Signed)
Immediate Anesthesia Transfer of Care Note  Patient: Brenda [name]  Procedure(s) Performed: Procedure(s): RIGHT TOTAL SHOULDER ARTHROPLASTY (Right)  Patient Location: PACU  Anesthesia Type:GA combined with regional for post-op pain  Level of Consciousness: sedated  Airway & Oxygen Therapy: Patient Spontanous Breathing and Patient connected to nasal cannula oxygen  Post-op Assessment: Report given to RN and Post -op Vital signs reviewed and stable  Post vital signs: Reviewed and stable  Last Vitals:  Vitals:   08/19/16 0622 08/19/16 0950  BP: (!) 100/42 (!) 125/44  Pulse:  72  Resp:  17  Temp:  36.6 C    Last Pain:  Vitals:   08/19/16 0602  TempSrc: Oral  PainSc:          Complications: No apparent anesthesia complications

## 2016-08-19 NOTE — H&P (Signed)
Brenda [name]    Chief Complaint: RIGHT SHOULDER OA HPI: The patient is a 65 y.o. female with end stage right shoulder OA  Past Medical History:  Diagnosis Date  . Anxiety   . Arthritis   . Depression    Wellbutrin  . Diabetes mellitus    Type II  . Endometrial ca ([place])    1998  . 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 GERD (gastroesophageal reflux disease)   . Heart murmur    patient states "cardiologist has not heard heart murmur for past several years"  . Heel spur   . Hyperlipidemia   . Hypertension   . Left leg swelling    "swelling in left leg from knee down when sitting for prolonged periods or being on feet for a long period of time"  . PONV (postoperative nausea and vomiting)    after hysterectomy    Past Surgical History:  Procedure Laterality Date  . ABDOMINAL HYSTERECTOMY  1998  . COLONOSCOPY    . EYE SURGERY Bilateral    cataracts  . EYE SURGERY Left    retina tear  . FEMUR IM NAIL  10/25/2012   Procedure: INTRAMEDULLARY (IM) NAIL FEMORAL;  Surgeon: Mauri Pole, MD;  Location: [facility];  Service: Orthopedics;  Laterality: Left;  . HIP SURGERY    . left knee arthroscopy  12/2010  . REFRACTIVE SURGERY    . TOTAL KNEE ARTHROPLASTY  12/20/2011   Procedure: TOTAL KNEE ARTHROPLASTY;  Surgeon: Gearlean Alf, MD;  Location: [facility];  Service: Orthopedics;  Laterality: Left;  Failed attempt at spinal     Family History  Problem Relation Age of Onset  . Cancer Mother     breast cancer  . Cancer Father     plasma sarcoma    Social History:  reports that she has never smoked. She has never used smokeless tobacco. She reports that she does not drink alcohol or use drugs.   Medications Prior to Admission  Medication Sig Dispense Refill  . aspirin 81 MG tablet Take 81 mg by mouth daily.    . benazepril-hydrochlorthiazide (LOTENSIN HCT) 20-12.5 MG tablet TAKE 1 TABLET BY MOUTH ONCE DAILY 30 tablet 11  . Blood Glucose  Monitoring Suppl ([place]) w/Device KIT 1 each by Does not apply route daily. Dx: E11.65 1 kit 0  . buPROPion (WELLBUTRIN XL) 300 MG 24 hr tablet Take 1 tablet (300 mg total) by mouth daily. 30 tablet 6  . Calcium-Magnesium-Vitamin D (CALCIUM 500 PO) Take by mouth 2 (two) times daily.    . cholecalciferol (VITAMIN D) 1000 UNITS tablet Take 2,000 Units by mouth daily.     . clonazePAM (KLONOPIN) 0.5 MG tablet TAKE 1 TABLET BY MOUTH 3 TIMES DAILY AS NEEDED FOR ANXIETY 90 tablet 0  . Esomeprazole Magnesium (NEXIUM PO) Take 22.5 mg by mouth daily.    . fluocinonide-emollient (LIDEX-E) 0.05 % cream Apply 1 application topically as needed. 30 g 0  . glipiZIDE (GLUCOTROL XL) 2.5 MG 24 hr tablet Take 1 tablet (2.5 mg total) by mouth 2 (two) times daily before a meal. 180 tablet 3  . glucose blood (ONETOUCH VERIO) test strip Use to test blood sugar twice daily as instructed. Dx: E11.65 100 each 4  . Liraglutide (VICTOZA) 18 MG/3ML SOPN Inject 0.3 mLs (1.8 mg total) into the skin daily. 27 mL 3  . meloxicam (MOBIC) 15 MG tablet Take 15  mg by mouth as needed for pain.    . metFORMIN (GLUCOPHAGE) 1000 MG tablet TAKE 1 TABLET BY MOUTH 2 TIMES DAILY WITH A MEAL. 180 tablet 1  . metoprolol succinate (TOPROL-XL) 25 MG 24 hr tablet TAKE 1 TABLET BY MOUTH DAILY. 90 tablet 3  . Multiple Vitamins-Minerals (HM MULTIVITAMIN ADULT GUMMY PO) Take 1 tablet by mouth daily.    Glory Rosebush DELICA LANCETS FINE MISC Use to test blood sugar twice daily as instructed. Dx: E11.65 100 each 4  . simvastatin (ZOCOR) 40 MG tablet TAKE 1 TABLET BY MOUTH AT BEDTIME. 90 tablet 3  . UNIFINE PENTIPS 31G X 8 MM MISC USE AS DIRECTED WITH VICTOZA 100 each 0  . gabapentin (NEURONTIN) 300 MG capsule TAKE 1-2 CAPSULES BY MOUTH AS NEEDED AT BEDTIME FOR RLS. 90 capsule 3  . methocarbamol (ROBAXIN) 500 MG tablet Take 500 mg by mouth as needed for muscle spasms.       Physical Exam: right shoulder with painful and restricted  motion as noted at recent office visits  Vitals  Temp:  [98.3 F (36.8 C)] 98.3 F (36.8 C) (10/19 0602) Pulse Rate:  [66] 66 (10/19 0602) Resp:  [20] 20 (10/19 0602) BP: (94-100)/(36-42) 100/42 (10/19 0622) SpO2:  [97 %] 97 % (10/19 0602) Weight:  [87.1 kg (192 lb)] 87.1 kg (192 lb) (10/19 0602)  Assessment/Plan  Impression: RIGHT SHOULDER OA  Plan of Action: Procedure(s): RIGHT TOTAL SHOULDER ARTHROPLASTY  Tania Perrott M Ayrabella Labombard 08/19/2016, 7:21 AM Contact # [phone]

## 2016-08-19 NOTE — Op Note (Signed)
08/19/2016  9:27 AM  PATIENT:   Brenda [name]  65 y.o. female  PRE-OPERATIVE DIAGNOSIS:  RIGHT SHOULDER OA  POST-OPERATIVE DIAGNOSIS:  same  PROCEDURE:  R TSA #10 stem, 46x20 head, medium glenoid  SURGEON:  Leray Garverick, Metta Clines M.D.  ASSISTANTS: Shuford pac   ANESTHESIA:   GET + ISB  EBL: 150  SPECIMEN:  none  Drains: none   PATIENT DISPOSITION:  PACU - hemodynamically stable.    PLAN OF CARE: Admit for overnight observation  Dictation# [id number]   Contact # [phone]

## 2016-08-19 NOTE — Anesthesia Postprocedure Evaluation (Signed)
Anesthesia Post Note  Patient: Brenda [name]  Procedure(s) Performed: Procedure(s) (LRB): RIGHT TOTAL SHOULDER ARTHROPLASTY (Right)  Patient location during evaluation: PACU Anesthesia Type: General and Regional Level of consciousness: awake and alert Pain management: pain level controlled Vital Signs Assessment: post-procedure vital signs reviewed and stable Respiratory status: spontaneous breathing, nonlabored ventilation, respiratory function stable and patient connected to nasal cannula oxygen Cardiovascular status: blood pressure returned to baseline and stable Postop Assessment: no signs of nausea or vomiting Anesthetic complications: no Comments: Did great    Last Vitals:  Vitals:   08/19/16 1050 08/19/16 1120  BP: 95/65 117/61  Pulse: 65 73  Resp: 13 19  Temp:      Last Pain:  Vitals:   08/19/16 1050  TempSrc:   PainSc: 0-No pain                 Zenaida Deed

## 2016-08-19 NOTE — Discharge Instructions (Signed)
° °  Metta Clines. Supple, M.D., F.A.A.O.S. Orthopaedic Surgery Specializing in Arthroscopic and Reconstructive Surgery of the Shoulder and Knee [phone] [address] [place], [place] [address] - Fax [phone]   POST-OP TOTAL SHOULDER REPLACEMENT INSTRUCTIONS  1. Call the office at [phone] to schedule your first post-op appointment 10-14 days from the date of your surgery.  2. The bandage over your incision is waterproof. You may begin showering with this dressing on. You may leave this dressing on until first follow up appointment within 2 weeks. We prefer you leave this dressing in place until follow up however after 5-7 days if you are having itching or skin irritation and would like to remove it you may do so. Go slow and tug at the borders gently to break the bond the dressing has with the skin. At this point if there is no drainage it is okay to go without a bandage or you may cover it with a light guaze and tape. You can also expect significant bruising around your shoulder that will drift down your arm and into your chest wall. This is very normal and should resolve over several days.   3. Wear your sling/immobilizer at all times except to perform the exercises below or to occasionally let your arm dangle by your side to stretch your elbow. You also need to sleep in your sling immobilizer until instructed otherwise.  4. Range of motion to your elbow, wrist, and hand are encouraged 3-5 times daily. Exercise to your hand and fingers helps to reduce swelling you may experience.  5. Utilize ice to the shoulder 3-5 times minimum a day and additionally if you are experiencing pain.  6. Prescriptions for a pain medication and a muscle relaxant are provided for you. It is recommended that if you are experiencing pain that you pain medication alone is not controlling, add the muscle relaxant along with the pain medication which can give additional pain relief. The first 1-2 days  is generally the most severe of your pain and then should gradually decrease. As your pain lessens it is recommended that you decrease your use of the pain medications to an "as needed basis'" only and to always comply with the recommended dosages of the pain medications.  7. Pain medications can produce constipation along with their use. If you experience this, the use of an over the counter stool softener or laxative daily is recommended.    8. For additional questions or concerns, please do not hesitate to call the office. If after hours there is an answering service to forward your concerns to the physician on call.  POST-OP EXERCISES  Pendulum Exercises  Perform pendulum exercises while standing and bending at the waist. Support your uninvolved arm on a table or chair and allow your operated arm to hang freely. Make sure to do these exercises passively - not using you shoulder muscles.  Repeat 20 times. Do 3 sessions per day.

## 2016-08-20 ENCOUNTER — Encounter (HOSPITAL_COMMUNITY): Payer: Self-pay | Admitting: Orthopedic Surgery

## 2016-08-20 LAB — GLUCOSE, CAPILLARY: GLUCOSE-CAPILLARY: 174 mg/dL — AB (ref 65–99)

## 2016-08-20 MED ORDER — OXYCODONE-ACETAMINOPHEN 5-325 MG PO TABS: 1.0000 | ORAL_TABLET | ORAL | 0 refills | Status: DC | PRN

## 2016-08-20 MED ORDER — METHOCARBAMOL 500 MG PO TABS: 500.0000 mg | ORAL_TABLET | Freq: Three times a day (TID) | ORAL | 1 refills | Status: DC | PRN

## 2016-08-20 MED ORDER — ONDANSETRON HCL 4 MG PO TABS: 4.0000 mg | ORAL_TABLET | Freq: Three times a day (TID) | ORAL | 0 refills | Status: DC | PRN

## 2016-08-20 MED ORDER — MELOXICAM 15 MG PO TABS: 15.0000 mg | ORAL_TABLET | ORAL | 2 refills | Status: DC | PRN

## 2016-08-20 MED FILL — OXYCODONE W/APAP 5/325 TAB: 5-325 | 5 days supply | Qty: 60 | Fill #0

## 2016-08-20 MED FILL — ONDANSETRON HCL 4 MG TABLET: 4 | 7 days supply | Qty: 20 | Fill #0

## 2016-08-20 MED FILL — MELOXICAM 15 MG TABLET: 15 | 30 days supply | Qty: 30 | Fill #0

## 2016-08-20 MED FILL — METHOCARBAMOL 500 MG TABLET: 500 | 13 days supply | Qty: 40 | Fill #0

## 2016-08-20 NOTE — Op Note (Signed)
Brenda [name], Brenda [name]             ACCOUNT NO.:  [id number]  MEDICAL RECORD NO.:  [id number]  LOCATION:  5N01C                        FACILITY:  [facility]  PHYSICIAN:  Metta Clines. Jenniffer Vessels, M.D.  DATE OF BIRTH:  June 19, 1951  DATE OF PROCEDURE:  08/19/2016 DATE OF DISCHARGE:                              OPERATIVE REPORT   PREOPERATIVE DIAGNOSIS:  End-stage right shoulder osteoarthritis.  POSTOPERATIVE DIAGNOSIS:  End-stage right shoulder osteoarthritis.  PROCEDURE:  Right total shoulder arthroplasty utilizing a press-fit size 10 Arthrex stem with a 46 x 20 head and a medium glenoid.  SURGEON:  Metta Clines. Myrissa Chipley, M.D.  Terrence DupontOlivia Mackie A. Shuford, P.A.-C.  ANESTHESIA:  General endotracheal as well as interscalene block.  ESTIMATED BLOOD LOSS:  150 mL.  DRAINS:  None.  HISTORY:  Brenda [name] is a 65 year old female, who has had chronic and progressive increasing right shoulder pain related to end-stage osteoarthritis.  Due to her increasing pain and functional limitation, she is brought to the operating room at this time for planned right total shoulder arthroplasty.  Preoperatively, I counseled Brenda [name] regarding treatment options, potential risks versus benefits thereof.  Possible surgical complications were reviewed including bleeding, infection, neurovascular injury, persistent pain, loss of motion, anesthetic complication, failure of the implant and possible need for additional surgery.  She understands and accepts and agrees with our planned procedure.  PROCEDURE IN DETAIL:  After undergoing routine preop evaluation, the patient received prophylactic antibiotics and an interscalene block was established in the holding room by the Anesthesia Department.  Placed supine on the operative table, underwent smooth induction of a general endotracheal anesthesia.  Placed in the beach-chair position and appropriately padded and protected.  The right shoulder girdle region was sterilely  prepped and draped in standard fashion.  Time-out was called.  An anterior deltopectoral approach to the right shoulder was made through an 8-cm anterior incision.  Skin flaps were elevated and electrocautery was used for hemostasis.  Dissection carried deeply.  The deltopectoral interval was then developed from proximal to distal with the vein taken laterally.  The upper centimeter of the pec major tendon was then tenotomized to enhance exposure.  Conjoined tendon retracted medially.  Browne retractor placed beneath the deltoid.  We then unroofed the long-head biceps tendon.  This was then tenotomized for later tenodesis.  We then divided the subscapularis away from the lesser tuberosity leaving a 1 cm cuff of tissue for later repair and the free margin of the subscap was then tagged with a series of grasping #2 FiberWire sutures.  We then divided the capsular attachments from the anteroinferior and inferior aspect of the humeral neck allowing delivery of the humeral head through the wound.  I outlined our proposed humeral head resection using the extramedullary guide, performed a resection with an oscillating saw and used a rongeur to remove the large osteophytes on the anteroinferior and inferior aspects of the humeral neck.  At this point, we then opened the humeral canal and then broached up to size 10, which showed excellent fit and fixation and maintained the native retroversion approximately 30 degrees.  At this point, we then placed a trial size 9 stem with a metal cap  over the cut proximal humeral surface.  At this point, we then exposed the glenoid using a combination of Fukuda, pitchfork and snake tongue retractors.  I performed a circumferential labral resection and removed the proximal stump of the biceps.  The glenoid was sized as a medium.  We then placed a guidepin into the center of the glenoid.  We reamed to a stable subchondral bony bed and then placed a central drill  hole.  We then used the glenoid guide to create the superior and inferior peg and slots respectively.  We then broached the glenoid, trial showed excellent fit. At this point, we performed a final irrigation of the glenoid and removed all residual bony and soft tissue debris.  Glenoid was dried, cement was mixed, introduced superior and inferior peg and slot respectively and then impacted our size medium glenoid with excellent fit and fixation.  We then returned our attention back to the proximal humerus where we impacted our size 10 stem to the proper level with excellent fit and fixation and then tightened the proximal locking screws appropriately.  At this point, we performed the series of trial reductions and ultimately the 46 x 20 eccentric head showed as the best soft tissue balance and coverage.  The final 46 x 20 head was then impacted onto the [facility] taper after dependently cleaned and dried. Final reduction was then performed.  We were very pleased with the overall soft tissue balance with approximately 50% translation of the humeral head on the glenoid.  At this point, the subscapularis was then repaired back to the lesser tuberosity utilizing the #2 FiberWires and also repaired the rotator interval with pair of figure-of-eight #2 FiberWire sutures.  The biceps tendon was then tenodesed to the upper border of the pec major and the proximal segment was then excised.  At this point, we performed final irrigation, hemostasis was obtained.  The deltopectoral interval was then reapproximated with series of figure-of- eight #1 Vicryl sutures.  2-0 Monocryl was used for the subcu layer, intracuticular 3-0 Monocryl for the skin followed by Dermabond and Aquacel dressing.  Right arm was then placed into a sling.  The patient was then awakened, extubated and taken to the recovery room in stable condition.  [name], PA-C, was used as an Environmental consultant throughout this case, was essential  for help with positioning the patient, positioning of the extremity, management of the tissue retractors, implantation of the prosthesis, wound closure and intraoperative decision making.     Metta Clines. Ashwin Tibbs, M.D.     KMS/MEDQ  D:  08/19/2016  T:  08/20/2016  Job:  [id number]

## 2016-08-20 NOTE — Evaluation (Signed)
Occupational Therapy Evaluation Patient Details Name: Brenda [name] MRN: [id number] DOB: [birth year] Today's Date: 08/20/2016    History of Present Illness Pt is a 65 year old female s/p R TSA. Pt has a past medical history significant for but not limited to DM, GERD, HTN, hyperlipedemia, and postoperative nausea and vomiting.   Clinical Impression   PTA, pt was independent with ADL and IADL and working as a Marine scientist. Pt currently requires min assist with UB ADL and min guard assist for functional mobility and supervision for passive protocol exercises. Pt would benefit from continued OT services while admitted to improve independence with ADL and functional mobility. Recommend D/C home with 24 hour assistance/supervision and follow-up per MD recommendations. OT will continue to follow acutely.    Follow Up Recommendations  No OT follow up;Other (comment);Supervision/Assistance - 24 hour (Follow up per MD recommendations.)    Equipment Recommendations  None recommended by OT    Recommendations for Other Services       Precautions / Restrictions Precautions Precautions: Shoulder Type of Shoulder Precautions: Passive protocol: No AROM R shoulder, NWB RUE, elbow/wrist/hand AROM, pendulums OK, forward flexion - 0-90, abduction - 0-60, external rotation 0-30. Shoulder Interventions: Shoulder sling/immobilizer Precaution Booklet Issued: Yes (comment) Required Braces or Orthoses: Sling Restrictions Weight Bearing Restrictions: Yes RUE Weight Bearing: Non weight bearing      Mobility Bed Mobility Overal bed mobility: Needs Assistance Bed Mobility: Supine to Sit;Sit to Supine     Supine to sit: Min guard Sit to supine: Min guard   General bed mobility comments: Min guard to maintain balance.  Transfers Overall transfer level: Needs assistance   Transfers: Sit to/from Stand Sit to Stand: Min guard              Balance Overall balance assessment: Needs  assistance Sitting-balance support: No upper extremity supported;Feet supported Sitting balance-Leahy Scale: Fair     Standing balance support: No upper extremity supported;During functional activity Standing balance-Leahy Scale: Fair                              ADL Overall ADL's : Needs assistance/impaired     Grooming: Oral care;Wash/dry hands;Standing;Supervision/safety   Upper Body Bathing: Sitting;Minimal assitance   Lower Body Bathing: Min guard;Sit to/from stand   Upper Body Dressing : Minimal assistance;Sitting   Lower Body Dressing: Min guard;Sit to/from stand   Toilet Transfer: Economist and Hygiene: Sit to/from stand;Min guard   Tub/ Shower Transfer: Walk-in shower;Min guard;Ambulation   Functional mobility during ADLs: Min guard General ADL Comments: Pt educated on dressing techniques, use of sling, use of ice for pain management, bed mobility techniques, and passive protocol exercises.     Vision     Perception     Praxis      Pertinent Vitals/Pain Pain Assessment: 0-10 Pain Score: 8  Pain Location: R shoulder Pain Descriptors / Indicators: Aching;Operative site guarding;Sore Pain Intervention(s): Limited activity within patient's tolerance;Monitored during session;Repositioned;Ice applied     Hand Dominance Left   Extremity/Trunk Assessment Upper Extremity Assessment Upper Extremity Assessment: RUE deficits/detail RUE Deficits / Details: Decreased strength and ROM  and increased pain post-operatively. RUE: Unable to fully assess due to immobilization   Lower Extremity Assessment Lower Extremity Assessment: Overall WFL for tasks assessed       Communication Communication Communication: No difficulties   Cognition Arousal/Alertness: Awake/alert Behavior During Therapy: WFL for tasks assessed/performed  Overall Cognitive Status: Within Functional Limits for tasks assessed                      General Comments       Exercises Exercises: Shoulder     Shoulder Instructions Shoulder Instructions Donning/doffing shirt without moving shoulder: Minimal assistance Method for sponge bathing under operated UE: Minimal assistance Donning/doffing sling/immobilizer: Minimal assistance Correct positioning of sling/immobilizer: Supervision/safety Pendulum exercises (written home exercise program): Min-guard ROM for elbow, wrist and digits of operated UE: Supervision/safety Sling wearing schedule (on at all times/off for ADL's): Supervision/safety Proper positioning of operated UE when showering: Supervision/safety Positioning of UE while sleeping: [place] expects to be discharged to:: Private residence Living Arrangements: Alone Available Help at Discharge: Family;Friend(s) Type of Home: House Home Access: Stairs to enter [employer] of Steps: 3 Entrance Stairs-Rails: Can reach both (in back) Home Layout: One level     Bathroom Shower/Tub: Walk-in shower;Door   [employer] Toilet: Standard     Home Equipment: Bedside commode          Prior Functioning/Environment Level of Independence: Independent                 OT Problem List: Decreased strength;Decreased range of motion;Decreased activity tolerance;Impaired balance (sitting and/or standing);Decreased safety awareness;Decreased knowledge of use of DME or AE;Decreased knowledge of precautions;Pain   OT Treatment/Interventions: Self-care/ADL training;Therapeutic exercise;Patient/family education;Balance training;DME and/or AE instruction    OT Goals(Current goals can be found in the care plan section) Acute Rehab OT Goals Patient Stated Goal: go home OT Goal Formulation: With patient Time For Goal Achievement: 09/03/16 Potential to Achieve Goals: Good ADL Goals Pt Will Perform Upper Body Bathing: with modified independence;sitting Pt Will  Perform Upper Body Dressing: with modified independence;sitting Pt Will Transfer to Toilet: with modified independence;ambulating Pt Will Perform Tub/Shower Transfer: with modified independence;ambulating Pt/caregiver will Perform Home Exercise Program:  (Shoulder passive protocol)  OT Frequency: Min 1X/week   Barriers to D/C:            Co-evaluation              End of Session Equipment Utilized During Treatment: Gait belt Nurse Communication: Other (comment) (IV needs attention.)  Activity Tolerance: Patient tolerated treatment well Patient left: in chair;with call bell/phone within reach   Time: 1410-3013 OT Time Calculation (min): 56 min Charges:  OT General Charges $OT Visit: 1 Procedure OT Evaluation $OT Eval Moderate Complexity: 1 Procedure OT Treatments $Self Care/Home Management : 38-52 mins G-Codes:    Ignatz Deis A Malaiyah Achorn September 03, 2016, 11:15 AM

## 2016-08-20 NOTE — Discharge Summary (Signed)
PATIENT ID:      Brenda [name]  MRN:     [id number] DOB/AGE:    Aug 14, 1951 / 65 y.o.     DISCHARGE SUMMARY  ADMISSION DATE:    08/19/2016 DISCHARGE DATE:    ADMISSION DIAGNOSIS: RIGHT SHOULDER OA Past Medical History:  Diagnosis Date  . Anxiety   . Arthritis   . Depression    Wellbutrin  . Diabetes mellitus    Type II  . Endometrial ca ([place])    1998  . 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 GERD (gastroesophageal reflux disease)   . Heart murmur    patient states "cardiologist has not heard heart murmur for past several years"  . Heel spur   . Hyperlipidemia   . Hypertension   . Left leg swelling    "swelling in left leg from knee down when sitting for prolonged periods or being on feet for a long period of time"  . PONV (postoperative nausea and vomiting)    after hysterectomy    DISCHARGE DIAGNOSIS:   Active Problems:   S/P shoulder replacement, right   PROCEDURE: Procedure(s): RIGHT TOTAL SHOULDER ARTHROPLASTY on 08/19/2016  CONSULTS:    HISTORY:  See H&P in chart.  HOSPITAL COURSE:  Brenda [name] is a 65 y.o. admitted on 08/19/2016 with a diagnosis of RIGHT SHOULDER OA.  They were brought to the operating room on 08/19/2016 and underwent Procedure(s): RIGHT TOTAL SHOULDER ARTHROPLASTY.    They were given perioperative antibiotics: Anti-infectives    Start     Dose/Rate Route Frequency Ordered Stop   08/19/16 1630  ceFAZolin (ANCEF) IVPB 2g/100 mL premix     2 g 200 mL/hr over 30 Minutes Intravenous Every 6 hours 08/19/16 1541 08/20/16 0503   08/18/16 1012  ceFAZolin (ANCEF) IVPB 2g/100 mL premix  Status:  Discontinued     2 g 200 mL/hr over 30 Minutes Intravenous On call to O.R. 08/18/16 1012 08/19/16 1541    .  Patient underwent the above named procedure and tolerated it well. The following day they were hemodynamically stable and pain was controlled on oral analgesics. They were neurovascularly intact to the  operative extremity. OT was ordered and worked with patient per protocol. They were medically and orthopaedically stable for discharge on day 1 .    DIAGNOSTIC STUDIES:  RECENT RADIOGRAPHIC STUDIES :  Mm Screening Breast Tomo Bilateral  Result Date: 07/30/2016 CLINICAL DATA:  Screening. EXAM: 2D DIGITAL SCREENING BILATERAL MAMMOGRAM WITH CAD AND ADJUNCT TOMO COMPARISON:  Previous exam(s). ACR Breast Density Category b: There are scattered areas of fibroglandular density. FINDINGS: There are no findings suspicious for malignancy. Images were processed with CAD. IMPRESSION: No mammographic evidence of malignancy. A result letter of this screening mammogram will be mailed directly to the patient. RECOMMENDATION: Screening mammogram in one year. (Code:SM-B-01Y) BI-RADS CATEGORY  1: Negative. Electronically Signed   By: Fidela Salisbury M.D.   On: 07/30/2016 14:40    RECENT VITAL SIGNS:  Patient Vitals for the past 24 hrs:  BP Temp Temp src Pulse Resp SpO2  08/20/16 0542 (!) 107/55 99.5 F (37.5 C) Oral 84 16 95 %  08/20/16 0100 (!) 109/44 99.2 F (37.3 C) Oral 82 16 97 %  08/19/16 2100 (!) 141/65 99 F (37.2 C) Oral 84 18 98 %  08/19/16 1510 (!) 122/50 98.1 F (36.7 C) Oral 81 20 95 %  08/19/16 1502 (!) 107/53 98.2 F (36.8 C) -  78 15 97 %  08/19/16 1450 112/61 - - 77 16 97 %  08/19/16 1420 96/71 - - 72 14 96 %  08/19/16 1350 (!) 103/48 - - 75 17 96 %  08/19/16 1320 (!) 107/48 - - 73 17 94 %  08/19/16 1245 109/64 - - 74 13 94 %  08/19/16 1220 (!) 96/55 - - 72 16 95 %  08/19/16 1150 (!) 104/55 - - 71 19 96 %  08/19/16 1120 117/61 - - 73 19 95 %  08/19/16 1050 95/65 - - 65 13 94 %  08/19/16 1035 (!) 90/55 - - 65 14 95 %  08/19/16 1020 (!) 102/40 - - 69 15 93 %  08/19/16 1005 115/64 - - 72 12 93 %  08/19/16 0950 (!) 125/44 97.9 F (36.6 C) - 72 17 98 %  .  RECENT EKG RESULTS:    Orders placed or performed in visit on 04/01/16  . EKG 12-Lead    DISCHARGE  INSTRUCTIONS:  Discharge Instructions    Discontinue IV    Complete by:  As directed       DISCHARGE MEDICATIONS:     Medication List    TAKE these medications   aspirin 81 MG tablet Take 81 mg by mouth daily.   benazepril-hydrochlorthiazide 20-12.5 MG tablet Commonly known as:  LOTENSIN HCT TAKE 1 TABLET BY MOUTH ONCE DAILY   buPROPion 300 MG 24 hr tablet Commonly known as:  WELLBUTRIN XL Take 1 tablet (300 mg total) by mouth daily.   CALCIUM 500 PO Take by mouth 2 (two) times daily.   cholecalciferol 1000 units tablet Commonly known as:  VITAMIN D Take 2,000 Units by mouth daily.   clonazePAM 0.5 MG tablet Commonly known as:  KLONOPIN TAKE 1 TABLET BY MOUTH 3 TIMES DAILY AS NEEDED FOR ANXIETY   fluocinonide-emollient 0.05 % cream Commonly known as:  LIDEX-E Apply 1 application topically as needed.   gabapentin 300 MG capsule Commonly known as:  NEURONTIN TAKE 1-2 CAPSULES BY MOUTH AS NEEDED AT BEDTIME FOR RLS.   glipiZIDE 2.5 MG 24 hr tablet Commonly known as:  GLUCOTROL XL Take 1 tablet (2.5 mg total) by mouth 2 (two) times daily before a meal.   glucose blood test strip Commonly known as:  ONETOUCH VERIO Use to test blood sugar twice daily as instructed. Dx: E11.65   HM MULTIVITAMIN ADULT GUMMY PO Take 1 tablet by mouth daily.   liraglutide 18 MG/3ML Sopn Commonly known as:  VICTOZA Inject 0.3 mLs (1.8 mg total) into the skin daily.   meloxicam 15 MG tablet Commonly known as:  MOBIC Take 1 tablet (15 mg total) by mouth as needed for pain.   metFORMIN 1000 MG tablet Commonly known as:  GLUCOPHAGE TAKE 1 TABLET BY MOUTH 2 TIMES DAILY WITH A MEAL.   methocarbamol 500 MG tablet Commonly known as:  ROBAXIN Take 500 mg by mouth as needed for muscle spasms. What changed:  Another medication with the same name was added. Make sure you understand how and when to take each.   methocarbamol 500 MG tablet Commonly known as:  ROBAXIN Take 1 tablet (500  mg total) by mouth every 8 (eight) hours as needed for muscle spasms. What changed:  You were already taking a medication with the same name, and this prescription was added. Make sure you understand how and when to take each.   metoprolol succinate 25 MG 24 hr tablet Commonly known as:  TOPROL-XL TAKE 1 TABLET  BY MOUTH DAILY.   NEXIUM PO Take 22.5 mg by mouth daily.   ondansetron 4 MG tablet Commonly known as:  ZOFRAN Take 1 tablet (4 mg total) by mouth every 8 (eight) hours as needed for nausea or vomiting.   ONETOUCH DELICA LANCETS FINE Misc Use to test blood sugar twice daily as instructed. Dx: E11.65   ONETOUCH VERIO FLEX SYSTEM w/Device Kit 1 each by Does not apply route daily. Dx: E11.65   oxyCODONE-acetaminophen 5-325 MG tablet Commonly known as:  PERCOCET Take 1-2 tablets by mouth every 4 (four) hours as needed.   simvastatin 40 MG tablet Commonly known as:  ZOCOR TAKE 1 TABLET BY MOUTH AT BEDTIME.   UNIFINE PENTIPS 31G X 8 MM Misc Generic drug:  Insulin Pen Needle USE AS DIRECTED WITH VICTOZA       FOLLOW UP VISIT:   Follow-up Information    KEVIN M SUPPLE, MD.   Specialty:  Orthopedic Surgery Why:  call to be seen in 10-14 days Contact information: [address] [place] [address] [phone]           DISCHARGE TO: home  DISPOSITION: good  DISCHARGE CONDITION:  Festus Barren for Dr. Justice Britain 08/20/2016, 8:33 AM

## 2016-08-30 DIAGNOSIS — Z471 Aftercare following joint replacement surgery: Secondary | ICD-10-CM | POA: Diagnosis not present

## 2016-08-30 DIAGNOSIS — M25511 Pain in right shoulder: Secondary | ICD-10-CM | POA: Diagnosis not present

## 2016-09-02 MED FILL — GABAPENTIN 300 MG CAPSULE: 300 | 45 days supply | Qty: 90 | Fill #3

## 2016-09-02 MED FILL — BUPROPION HCL XL 300 MG TAB: 300 | 30 days supply | Qty: 30 | Fill #3

## 2016-09-09 MED FILL — BENAZEPRIL-HCTZ 20-12.5 MG: 20-12.5 | 30 days supply | Qty: 30 | Fill #2

## 2016-09-10 DIAGNOSIS — M25511 Pain in right shoulder: Secondary | ICD-10-CM | POA: Diagnosis not present

## 2016-09-15 DIAGNOSIS — M25511 Pain in right shoulder: Secondary | ICD-10-CM | POA: Diagnosis not present

## 2016-09-22 DIAGNOSIS — M25511 Pain in right shoulder: Secondary | ICD-10-CM | POA: Diagnosis not present

## 2016-09-27 MED FILL — SIMVASTATIN 40 MG TABLET: 40 | 90 days supply | Qty: 90 | Fill #2

## 2016-09-27 MED FILL — METHOCARBAMOL 500 MG TABLET: 500 | 13 days supply | Qty: 40 | Fill #1

## 2016-09-29 DIAGNOSIS — Z96611 Presence of right artificial shoulder joint: Secondary | ICD-10-CM | POA: Diagnosis not present

## 2016-09-29 DIAGNOSIS — Z471 Aftercare following joint replacement surgery: Secondary | ICD-10-CM | POA: Diagnosis not present

## 2016-09-29 DIAGNOSIS — M25511 Pain in right shoulder: Secondary | ICD-10-CM | POA: Diagnosis not present

## 2016-09-30 DIAGNOSIS — E119 Type 2 diabetes mellitus without complications: Secondary | ICD-10-CM | POA: Diagnosis not present

## 2016-09-30 DIAGNOSIS — H33012 Retinal detachment with single break, left eye: Secondary | ICD-10-CM | POA: Diagnosis not present

## 2016-10-04 ENCOUNTER — Other Ambulatory Visit: Payer: Self-pay | Admitting: Family Medicine

## 2016-10-04 MED FILL — BUPROPION HCL XL 300 MG TAB: 300 | 30 days supply | Qty: 30 | Fill #4

## 2016-10-05 MED FILL — clonazePAM 0.5 MG TABS: 0.5 | 30 days supply | Qty: 90 | Fill #0

## 2016-10-05 NOTE — Telephone Encounter (Signed)
Rx called in to requested pharmacy 

## 2016-10-05 NOTE — Telephone Encounter (Signed)
Last f/u 05/2016

## 2016-10-06 DIAGNOSIS — M25511 Pain in right shoulder: Secondary | ICD-10-CM | POA: Diagnosis not present

## 2016-10-13 DIAGNOSIS — M25511 Pain in right shoulder: Secondary | ICD-10-CM | POA: Diagnosis not present

## 2016-10-14 ENCOUNTER — Other Ambulatory Visit: Payer: Self-pay | Admitting: *Deleted

## 2016-10-14 MED ORDER — GABAPENTIN 300 MG PO CAPS: ORAL_CAPSULE | ORAL | 1 refills | Status: DC

## 2016-10-14 MED FILL — BENAZEPRIL-HCTZ 20-12.5 MG: 20-12.5 | 30 days supply | Qty: 30 | Fill #3

## 2016-10-14 MED FILL — GABAPENTIN 300 MG CAPSULE: 300 | 90 days supply | Qty: 180 | Fill #0

## 2016-10-19 MED FILL — MELOXICAM 15 MG TABLET: 15 | 30 days supply | Qty: 30 | Fill #1

## 2016-10-19 MED FILL — METOPROLOL SUCC ER 25 MG TA: 25 | 90 days supply | Qty: 90 | Fill #3

## 2016-10-19 MED FILL — VICTOZA 18 MG/3 ML INJECT P: 18 | 30 days supply | Qty: 9 | Fill #4

## 2016-10-20 ENCOUNTER — Telehealth: Payer: Self-pay | Admitting: Internal Medicine

## 2016-10-20 ENCOUNTER — Ambulatory Visit (INDEPENDENT_AMBULATORY_CARE_PROVIDER_SITE_OTHER): Payer: Medicare Other | Admitting: Internal Medicine

## 2016-10-20 ENCOUNTER — Encounter: Payer: Self-pay | Admitting: Internal Medicine

## 2016-10-20 VITALS — BP 116/72 | HR 79 | Ht 60.0 in | Wt 192.4 lb

## 2016-10-20 DIAGNOSIS — M25511 Pain in right shoulder: Secondary | ICD-10-CM | POA: Diagnosis not present

## 2016-10-20 DIAGNOSIS — E1165 Type 2 diabetes mellitus with hyperglycemia: Secondary | ICD-10-CM | POA: Diagnosis not present

## 2016-10-20 LAB — POCT GLYCOSYLATED HEMOGLOBIN (HGB A1C): HEMOGLOBIN A1C: 6.6

## 2016-10-20 MED ORDER — GLUCOSE BLOOD VI STRP: ORAL_STRIP | 12 refills | Status: DC

## 2016-10-20 MED ORDER — METFORMIN HCL ER 500 MG PO TB24: 1000.0000 mg | ORAL_TABLET | Freq: Two times a day (BID) | ORAL | 3 refills | Status: DC

## 2016-10-20 MED ORDER — FREESTYLE FREEDOM LITE W/DEVICE KIT: PACK | 1 refills | Status: DC

## 2016-10-20 MED ORDER — FREESTYLE LANCETS MISC: 12 refills | Status: DC

## 2016-10-20 MED FILL — METFORMIN HCL ER 500 MG TAB: 500 | 90 days supply | Qty: 360 | Fill #0

## 2016-10-20 NOTE — Telephone Encounter (Signed)
Done

## 2016-10-20 NOTE — Patient Instructions (Signed)
Please change to Metformin ER 1000 mg 2x a day.  Continue: - Victoza 1.8 mg daily - Glipizide ER 2.5 mg 2x a day before meals (crush the pill before dinner)  Please return in 3 months with your sugar log.

## 2016-10-20 NOTE — Telephone Encounter (Signed)
Send supplies  glucose blood (FREESTYLE LITE) test strip  Lancets (FREESTYLE) lancets  Send to  [id number] - [place], [place] - [place] [phone] (Phone) [phone] (Fax)

## 2016-10-20 NOTE — Progress Notes (Signed)
Patient ID: Brenda [name], female   DOB: 11/17/1950, 65 y.o.   MRN: [id number]  HPI: Brenda [name] is a 65 y.o.-year-old female, returning for follow-up for DM2, dx in 2007, non-insulin-dependent, uncontrolled, with complications (mild CKD). Last visit 3 months ago.  Last hemoglobin A1c was: Lab Results  Component Value Date   HGBA1C 6.6 10/20/2016   HGBA1C 7.4 07/21/2016   HGBA1C 6.8 04/23/2016   Pt is on a regimen of: - Metformin 1000 mg 2x a day, with meals - Victoza 1.8 mg daily - started 12/2014 - Glipizide ER 2.5 mg 2x a day before meals (crushes the dose before dinner). She was on Glipizide in 11/2015 after a steroid inj. At last visit, we stopped Glipizide 5 mg 2x a day, before meals, because of lows.  Pt checks her sugars 3 a day and they are: - am: 60, 79, 91-140 >> 86-143, 170 >> 83, 96-168, 183, 200 >> 86, 114-152, 158 >> 71, 79-129, 1771 - 2h after b'fast: 114 >> 105, 129 >> n/c >> 156 >> n/c - before lunch: 51, 60-116 >> 53, 58 >> 50, 61x2, 104-108 >> 62x1, 72-127, 170 >> 62, 85-134, 153 - 2h after lunch: 71 >> 64-138 >> 64, 81-138 >> n/c - before dinner:] 69-128, 139 >> 60, 90-142, 163 >> 88-140, 169, 180 >> 50, 59, 79-158 - 2h after dinner: n/c >> 147 >> 151 >> 180 >> n/c - bedtime: n/c >> 89-158 >> n/c >> 132-207 - nighttime: n/c No lows. Lowest sugar was 49, 50s >> 60 >> 50x1 >> 62 >> 50, 59 (skipped meals); she has hypoglycemia awareness at 70.  Highest sugar was 177 >> 165 >> 200 >> 170 >> 207  Glucometer: Freestyle Lite  Pt's meals are: - Breakfast: English muffin + preserve or cereal or yoghurt - snack: fruit or fruit + yoghurt - Lunch: 1/2 sandwich + chips + salsa + fruit/sugar free - Dinner: soup or chicken/steak + veggies, occasional starch or Lean cuisine or light salad  - + mild CKD, last BUN/creatinine:  Lab Results  Component Value Date   BUN 39 (H) 08/12/2016   CREATININE 1.48 (H) 08/12/2016  On Benazepril. - last set of lipids: Lab  Results  Component Value Date   CHOL 125 07/21/2016   HDL 29.70 (L) 07/21/2016   LDLCALC 59 07/21/2016   TRIG 180.0 (H) 07/21/2016   CHOLHDL 4 07/21/2016  On Simvastatin. - last eye exam was on 03/01/2016. No DR. Cataract sx in 12/2014. - no numbness and tingling in her feet. On Gabapentin for leg cramps at night. On ASA 81.  She also has HTN and HL, GERD, depression.  ROS: Constitutional: no weight gain/loss, no fatigue, no subjective hyperthermia/hypothermia Eyes: no blurry vision, no xerophthalmia ENT: no sore throat, no nodules palpated in throat, no dysphagia/odynophagia, no hoarseness Cardiovascular: no CP/SOB/palpitations/L leg swelling (after L knee and hip sx) Respiratory: no cough/SOB Gastrointestinal: no N/V/D/C/heartburn Musculoskeletal: no muscle/joint aches  Skin: no rashes Neurological: no tremors/numbness/tingling/dizziness  I reviewed pt's medications, allergies, PMH, social hx, family hx, and changes were documented in the history of present illness. Otherwise, unchanged from my initial visit note.  Past Medical History:  Diagnosis Date  . Anxiety   . Arthritis   . Depression    Wellbutrin  . Diabetes mellitus    Type II  . Endometrial ca ([place])    1998  . 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 GERD (  gastroesophageal reflux disease)   . Heart murmur    patient states "cardiologist has not heard heart murmur for past several years"  . Heel spur   . Hyperlipidemia   . Hypertension   . Left leg swelling    "swelling in left leg from knee down when sitting for prolonged periods or being on feet for a long period of time"  . PONV (postoperative nausea and vomiting)    after hysterectomy   Past Surgical History:  Procedure Laterality Date  . ABDOMINAL HYSTERECTOMY  1998  . COLONOSCOPY    . EYE SURGERY Bilateral    cataracts  . EYE SURGERY Left    retina tear  . FEMUR IM NAIL  10/25/2012   Procedure:  INTRAMEDULLARY (IM) NAIL FEMORAL;  Surgeon: Mauri Pole, MD;  Location: [facility];  Service: Orthopedics;  Laterality: Left;  . HIP SURGERY    . left knee arthroscopy  12/2010  . REFRACTIVE SURGERY    . TOTAL KNEE ARTHROPLASTY  12/20/2011   Procedure: TOTAL KNEE ARTHROPLASTY;  Surgeon: Gearlean Alf, MD;  Location: [facility];  Service: Orthopedics;  Laterality: Left;  Failed attempt at spinal   . TOTAL SHOULDER ARTHROPLASTY Right 08/19/2016   Procedure: RIGHT TOTAL SHOULDER ARTHROPLASTY;  Surgeon: Justice Britain, MD;  Location: [facility];  Service: Orthopedics;  Laterality: Right;   Social History   Social History  . Marital Status: Single    Spouse Name: N/A  . Number of Children: 0   Occupational History  . 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 Smoking status: Never Smoker   . Smokeless tobacco: Never Used  . Alcohol Use: No  . 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 aspirin 81 MG tablet Take 81 mg by mouth daily.    . benazepril-hydrochlorthiazide (LOTENSIN HCT) 20-12.5 MG tablet TAKE 1 TABLET BY MOUTH ONCE DAILY 30 tablet 11  . Blood Glucose Monitoring Suppl ([place]) w/Device KIT 1 each by Does not apply route daily. Dx: E11.65 1 kit 0  . buPROPion (WELLBUTRIN XL) 300 MG 24 hr tablet Take 1 tablet (300 mg total) by mouth daily. 30 tablet 6  . Calcium-Magnesium-Vitamin D (CALCIUM 500 PO) Take by mouth 2 (two) times daily.    . cholecalciferol (VITAMIN D) 1000 UNITS tablet Take 2,000 Units by mouth daily.     . clonazePAM (KLONOPIN) 0.5 MG tablet TAKE 1 TABLET BY MOUTH 3 TIMES DAILY AS NEEDED FOR ANXIETY 90 tablet PRN  . Esomeprazole Magnesium (NEXIUM PO) Take 22.5 mg by mouth daily.    . fluocinonide-emollient (LIDEX-E) 0.05 % cream Apply 1 application topically as needed. 30 g 0  . gabapentin (NEURONTIN) 300 MG capsule TAKE 1-2  CAPSULES BY MOUTH AS NEEDED AT BEDTIME FOR RLS. 270 capsule 1  . glipiZIDE (GLUCOTROL XL) 2.5 MG 24 hr tablet Take 1 tablet (2.5 mg total) by mouth 2 (two) times daily before a meal. 180 tablet 3  . glucose blood (ONETOUCH VERIO) test strip Use to test blood sugar twice daily as instructed. Dx: E11.65 100 each 4  . Liraglutide (VICTOZA) 18 MG/3ML SOPN Inject 0.3 mLs (1.8 mg total) into the skin daily. 27 mL 3  . meloxicam (MOBIC) 15 MG tablet Take 1 tablet (15 mg total) by mouth as needed for pain. [place]  tablet 2  . metFORMIN (GLUCOPHAGE) 1000 MG tablet TAKE 1 TABLET BY MOUTH 2 TIMES DAILY WITH A MEAL. 180 tablet 1  . methocarbamol (ROBAXIN) 500 MG tablet Take 500 mg by mouth as needed for muscle spasms.    . metoprolol succinate (TOPROL-XL) 25 MG 24 hr tablet TAKE 1 TABLET BY MOUTH DAILY. 90 tablet 3  . Multiple Vitamins-Minerals (HM MULTIVITAMIN ADULT GUMMY PO) Take 1 tablet by mouth daily.    . ondansetron (ZOFRAN) 4 MG tablet Take 1 tablet (4 mg total) by mouth every 8 (eight) hours as needed for nausea or vomiting. 20 tablet 0  . ONETOUCH DELICA LANCETS FINE MISC Use to test blood sugar twice daily as instructed. Dx: E11.65 100 each 4  . simvastatin (ZOCOR) 40 MG tablet TAKE 1 TABLET BY MOUTH AT BEDTIME. 90 tablet 3  . UNIFINE PENTIPS 31G X 8 MM MISC USE AS DIRECTED WITH VICTOZA 100 each 0   No current facility-administered medications on file prior to visit.    Allergies  Allergen Reactions  . No Known Allergies    Family History  Problem Relation Age of Onset  . Cancer Mother     breast cancer  . Cancer Father     plasma sarcoma   PE: BP 116/72   Pulse 79   Ht 5' (1.524 m)   Wt 192 lb 6.4 oz (87.3 kg)   SpO2 97%   BMI 37.58 kg/m  Body mass index is 37.58 kg/m. Wt Readings from Last 3 Encounters:  10/20/16 192 lb 6.4 oz (87.3 kg)  08/19/16 192 lb (87.1 kg)  08/12/16 192 lb 8 oz (87.3 kg)   Constitutional: overweight, truncal obesity, in NAD Eyes: PERRLA, EOMI, no  exophthalmos ENT: moist mucous membranes, no thyromegaly, no cervical lymphadenopathy Cardiovascular: RRR, No MRG, + L LE edema Respiratory: CTA B Gastrointestinal: abdomen soft, NT, ND, BS+ Musculoskeletal: no deformities, strength intact in all 4 Skin: moist, warm Neurological: no tremor with outstretched hands, DTR normal in all 4  ASSESSMENT: 1. DM2, non-insulin-dependent, uncontrolled, with complications and hypeglycemia - mild CKD  PLAN:  1. Patient with long-standing, previously uncontrolled diabetes, now on Metformin, Victoza, Glipizide, with blood sugars at goal at most checks. She has diarrhea with reg. Metformin >> will switch to Metformin ER. - I suggested to:  Patient Instructions  Please change to Metformin ER 1000 mg 2x a day.  Continue: - Victoza 1.8 mg daily - Glipizide ER 2.5 mg 2x a day before meals (crush the pill before dinner)  Please return in 3 months with your sugar log.   - continue checking sugars at different times of the day - check 2 times a day, rotating checks - given more sugar logs  - advised for yearly eye exams. She is UTD. - checked HbA1c today >> 7.0% (lower!)  - Return to clinic in 3 mo with sugar log   Philemon Kingdom, MD PhD [facility]

## 2016-10-27 DIAGNOSIS — M25511 Pain in right shoulder: Secondary | ICD-10-CM | POA: Diagnosis not present

## 2016-11-04 ENCOUNTER — Other Ambulatory Visit: Payer: Self-pay | Admitting: Internal Medicine

## 2016-11-04 DIAGNOSIS — M25511 Pain in right shoulder: Secondary | ICD-10-CM | POA: Diagnosis not present

## 2016-11-04 MED FILL — BUPROPION HCL XL 300 MG TAB: 300 | 30 days supply | Qty: 30 | Fill #5

## 2016-11-04 MED FILL — UNIFINE PENTIPS 8MM 31G: 31G X 8 MM | 90 days supply | Qty: 100 | Fill #0

## 2016-11-08 DIAGNOSIS — Z96611 Presence of right artificial shoulder joint: Secondary | ICD-10-CM | POA: Diagnosis not present

## 2016-11-08 DIAGNOSIS — Z471 Aftercare following joint replacement surgery: Secondary | ICD-10-CM | POA: Diagnosis not present

## 2016-11-09 MED FILL — glipiZIDE ER 2.5 MG TB24: 2.5 | 90 days supply | Qty: 180 | Fill #3

## 2016-11-10 ENCOUNTER — Other Ambulatory Visit: Payer: Self-pay | Admitting: Family Medicine

## 2016-11-10 MED FILL — BENAZEPRIL-HCTZ 20-12.5 MG: 20-12.5 | 30 days supply | Qty: 30 | Fill #4

## 2016-11-10 NOTE — Telephone Encounter (Signed)
Last f/u 05/2016

## 2016-11-10 NOTE — Telephone Encounter (Signed)
Rx called in to requested pharmacy 

## 2016-11-11 MED FILL — clonazePAM 0.5 MG TABS: 0.5 | 30 days supply | Qty: 90 | Fill #0

## 2016-11-12 MED FILL — MELOXICAM 15 MG TABLET: 15 | 30 days supply | Qty: 30 | Fill #2

## 2016-11-12 MED FILL — VICTOZA 18 MG/3 ML INJECT P: 18 | 30 days supply | Qty: 9 | Fill #5

## 2016-12-06 MED FILL — BUPROPION HCL XL 300 MG TAB: 300 | 30 days supply | Qty: 30 | Fill #6

## 2016-12-15 MED FILL — clonazePAM 0.5 MG TABS: 0.5 | 30 days supply | Qty: 90 | Fill #1

## 2016-12-15 MED FILL — BENAZEPRIL-HCTZ 20-12.5 MG: 20-12.5 | 30 days supply | Qty: 30 | Fill #5

## 2016-12-23 MED FILL — VICTOZA 18 MG/3 ML INJECT P: 18 | 30 days supply | Qty: 9 | Fill #6

## 2016-12-30 ENCOUNTER — Other Ambulatory Visit: Payer: Self-pay

## 2016-12-30 MED ORDER — LIRAGLUTIDE 18 MG/3ML ~~LOC~~ SOPN: 1.8000 mg | PEN_INJECTOR | Freq: Every day | SUBCUTANEOUS | 1 refills | Status: DC

## 2017-01-10 ENCOUNTER — Other Ambulatory Visit: Payer: Self-pay | Admitting: Family Medicine

## 2017-01-10 MED FILL — GABAPENTIN 300 MG CAPSULE: 300 | 90 days supply | Qty: 180 | Fill #1

## 2017-01-10 MED FILL — BUPROPION HCL XL 300 MG TAB: 300 | 30 days supply | Qty: 30 | Fill #0

## 2017-01-17 MED FILL — METFORMIN HCL ER 500 MG TAB: 500 | 90 days supply | Qty: 360 | Fill #1

## 2017-01-17 MED FILL — SIMVASTATIN 40 MG TABLET: 40 | 90 days supply | Qty: 90 | Fill #3

## 2017-01-17 MED FILL — BENAZEPRIL-HCTZ 20-12.5 MG: 20-12.5 | 30 days supply | Qty: 30 | Fill #6

## 2017-01-18 ENCOUNTER — Other Ambulatory Visit: Payer: Self-pay | Admitting: Cardiovascular Disease

## 2017-01-18 ENCOUNTER — Ambulatory Visit (INDEPENDENT_AMBULATORY_CARE_PROVIDER_SITE_OTHER): Payer: Medicare Other | Admitting: Internal Medicine

## 2017-01-18 ENCOUNTER — Encounter: Payer: Self-pay | Admitting: Internal Medicine

## 2017-01-18 VITALS — BP 130/72 | HR 82 | Ht 60.0 in | Wt 193.0 lb

## 2017-01-18 DIAGNOSIS — E1165 Type 2 diabetes mellitus with hyperglycemia: Secondary | ICD-10-CM

## 2017-01-18 LAB — POCT GLYCOSYLATED HEMOGLOBIN (HGB A1C): HEMOGLOBIN A1C: 6.9

## 2017-01-18 MED ORDER — METOPROLOL SUCCINATE ER 25 MG PO TB24: 25.0000 mg | ORAL_TABLET | Freq: Every day | ORAL | 1 refills | Status: DC

## 2017-01-18 MED FILL — METOPROLOL SUCC ER 25 MG TA: 25 | 90 days supply | Qty: 90 | Fill #0

## 2017-01-18 NOTE — Addendum Note (Signed)
Addended by: Caprice Beaver T on: 01/18/2017 03:25 PM   Modules accepted: Orders

## 2017-01-18 NOTE — Patient Instructions (Signed)
Please continue: - Metformin ER 1000 mg 2x a day. - Victoza 1.8 mg daily - Glipizide ER 2.5 mg 2x a day before meals (crush the pill before dinner)  Please return in 3 months with your sugar log.

## 2017-01-18 NOTE — Progress Notes (Signed)
Patient ID: Brenda [name], female   DOB: 27-Dec-1950, 66 y.o.   MRN: [id number]  HPI: Brenda [name] is a 66 y.o.-year-old female, returning for follow-up for DM2, dx in 2007, non-insulin-dependent, uncontrolled, with complications (mild CKD). Last visit 3 months ago.  Last hemoglobin A1c was: Lab Results  Component Value Date   HGBA1C 6.6 10/20/2016   HGBA1C 7.4 07/21/2016   HGBA1C 6.8 04/23/2016   Pt is on a regimen of: - Metformin ER 1000 mg 2x a day. - Victoza 1.8 mg daily - Glipizide ER 2.5 mg 2x a day before meals She was on Glipizide in 11/2015 after a steroid inj. At last visit, we stopped Glipizide 5 mg 2x a day, before meals, because of lows.  Pt checks her sugars 3 a day and they are: - am: 60, 79, 91-140 >> 86-143, 170 >> 83, 96-168, 183, 200 >> 86, 114-152, 158 >> 71, 79-129, 171 >> 104-147, 154, 178 - 2h after b'fast: 114 >> 105, 129 >> n/c >> 156 >> n/c - before lunch: 51, 60-116 >> 53, 58 >> 50, 61x2, 104-108 >> 62x1, 72-127, 170 >> 62, 85-134, 153 >> 63, 114-121, 148 - 2h after lunch: 71 >> 64-138 >> 64, 81-138 >> n/c - before dinner:] 69-128, 139 >> 60, 90-142, 163 >> 88-140, 169, 180 >> 50, 59, 79-158 >> 37 x1, 61-124, 163 - 2h after dinner: n/c >> 147 >> 151 >> 180 >> n/c - bedtime: n/c >> 89-158 >> n/c >> 132-207 >> 74, 80-170 - nighttime: n/c No lows. Lowest sugar was 49, 50s >> 60 >> 50x1 >> 62 >> 50, 59 (skipped meals) >> 37 x1; she has hypoglycemia awareness at 70.  Highest sugar was 177 >> 165 >> 200 >> 170 >> 207 >> 178  Glucometer: Freestyle Lite  Pt's meals are: - Breakfast: English muffin + preserve or cereal or yoghurt - snack: fruit or fruit + yoghurt - Lunch: 1/2 sandwich + chips + salsa + fruit/sugar free - Dinner: soup or chicken/steak + veggies, occasional starch or Lean cuisine or light salad  - + mild CKD, last BUN/creatinine:  Lab Results  Component Value Date   BUN 39 (H) 08/12/2016   CREATININE 1.48 (H) 08/12/2016  On  Benazepril. - last set of lipids: Lab Results  Component Value Date   CHOL 125 07/21/2016   HDL 29.70 (L) 07/21/2016   LDLCALC 59 07/21/2016   TRIG 180.0 (H) 07/21/2016   CHOLHDL 4 07/21/2016  On Simvastatin. - last eye exam was on 03/01/2016. No DR. Cataract sx in 12/2014. - no numbness and tingling in her feet. On Gabapentin for leg cramps at night. On ASA 81.  She also has HTN and HL, GERD, depression.  ROS: Constitutional: no weight gain/loss, no fatigue, no subjective hyperthermia/hypothermia Eyes: no blurry vision, no xerophthalmia ENT: no sore throat, no nodules palpated in throat, no dysphagia/odynophagia, no hoarseness Cardiovascular: no CP/SOB/palpitations/+ L ankle swelling (after L knee and hip sx) Respiratory: no cough/SOB Gastrointestinal: no N/V/D/C/heartburn Musculoskeletal: no muscle/joint aches  Skin: no rashes Neurological: no tremors/numbness/tingling/dizziness  I reviewed pt's medications, allergies, PMH, social hx, family hx, and changes were documented in the history of present illness. Otherwise, unchanged from my initial visit note.  Past Medical History:  Diagnosis Date  . Anxiety   . Arthritis   . Depression    Wellbutrin  . Diabetes mellitus    Type II  . Endometrial ca ([place])    1998  . Foot fracture, left    "  non-union fracture that happened 30-40 years ago"  . GERD (gastroesophageal reflux disease)   . Heart murmur    patient states "cardiologist has not heard heart murmur for past several years"  . Heel spur   . Hyperlipidemia   . Hypertension   . Left leg swelling    "swelling in left leg from knee down when sitting for prolonged periods or being on feet for a long period of time"  . PONV (postoperative nausea and vomiting)    after hysterectomy   Past Surgical History:  Procedure Laterality Date  . ABDOMINAL HYSTERECTOMY  1998  . COLONOSCOPY    . EYE SURGERY Bilateral    cataracts  . EYE SURGERY Left    retina tear  . FEMUR  IM NAIL  10/25/2012   Procedure: INTRAMEDULLARY (IM) NAIL FEMORAL;  Surgeon: Mauri Pole, MD;  Location: [facility];  Service: Orthopedics;  Laterality: Left;  . HIP SURGERY    . left knee arthroscopy  12/2010  . REFRACTIVE SURGERY    . TOTAL KNEE ARTHROPLASTY  12/20/2011   Procedure: TOTAL KNEE ARTHROPLASTY;  Surgeon: Gearlean Alf, MD;  Location: [facility];  Service: Orthopedics;  Laterality: Left;  Failed attempt at spinal   . TOTAL SHOULDER ARTHROPLASTY Right 08/19/2016   Procedure: RIGHT TOTAL SHOULDER ARTHROPLASTY;  Surgeon: Justice Britain, MD;  Location: [facility];  Service: Orthopedics;  Laterality: Right;   Social History   Social History  . Marital Status: Single    Spouse Name: N/A  . Number of Children: 0   Occupational History  . 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 Smoking status: Never Smoker   . Smokeless tobacco: Never Used  . Alcohol Use: No  . 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 aspirin 81 MG tablet Take 81 mg by mouth daily.    . benazepril-hydrochlorthiazide (LOTENSIN HCT) 20-12.5 MG tablet TAKE 1 TABLET BY MOUTH ONCE DAILY 30 tablet 11  . Blood Glucose Monitoring Suppl (FREESTYLE FREEDOM LITE) w/Device KIT Use to check blood sugar twice a day, E11.65 1 each 1  . buPROPion (WELLBUTRIN XL) 300 MG 24 hr tablet TAKE 1 TABLET BY MOUTH ONCE DAILY 30 tablet 2  . Calcium-Magnesium-Vitamin D (CALCIUM 500 PO) Take by mouth 2 (two) times daily.    . cholecalciferol (VITAMIN D) 1000 UNITS tablet Take 2,000 Units by mouth daily.     . clonazePAM (KLONOPIN) 0.5 MG tablet TAKE 1 TABLET BY MOUTH 3 TIMES DAILY AS NEEDED FOR ANXIETY 90 tablet 3  . Esomeprazole Magnesium (NEXIUM PO) Take 22.5 mg by mouth daily.    Marland Kitchen gabapentin (NEURONTIN) 300 MG capsule TAKE 1-2 CAPSULES BY MOUTH AS NEEDED AT BEDTIME FOR RLS. 270 capsule 1  . glipiZIDE (GLUCOTROL XL)  2.5 MG 24 hr tablet Take 1 tablet (2.5 mg total) by mouth 2 (two) times daily before a meal. 180 tablet 3  . glucose blood (FREESTYLE LITE) test strip Use to check blood sugar twice a day, E11.65 100 each 12  . Lancets (FREESTYLE) lancets Use to check blood sugar twice a day, E11.65 100 each 12  . liraglutide (VICTOZA) 18 MG/3ML SOPN Inject 0.3 mLs (1.8 mg total) into the skin daily. 27 mL 1  . metFORMIN (GLUCOPHAGE-XR) 500 MG 24 hr tablet Take 2 tablets (1,000 mg total)  by mouth 2 (two) times daily with a meal. 360 tablet 3  . metoprolol succinate (TOPROL-XL) 25 MG 24 hr tablet TAKE 1 TABLET BY MOUTH DAILY. 90 tablet 3  . Multiple Vitamins-Minerals (HM MULTIVITAMIN ADULT GUMMY PO) Take 1 tablet by mouth daily.    . ondansetron (ZOFRAN) 4 MG tablet Take 1 tablet (4 mg total) by mouth every 8 (eight) hours as needed for nausea or vomiting. 20 tablet 0  . simvastatin (ZOCOR) 40 MG tablet TAKE 1 TABLET BY MOUTH AT BEDTIME. 90 tablet 3  . UNIFINE PENTIPS 31G X 8 MM MISC USE AS DIRECTED WITH VICTOZA 100 each 88   No current facility-administered medications on file prior to visit.    Allergies  Allergen Reactions  . No Known Allergies    Family History  Problem Relation Age of Onset  . Cancer Mother     breast cancer  . Cancer Father     plasma sarcoma   PE: BP 130/72 (BP Location: Left Arm, Patient Position: Sitting)   Pulse 82   Ht 5' (1.524 m)   Wt 193 lb (87.5 kg)   SpO2 95%   BMI 37.69 kg/m  Body mass index is 37.69 kg/m. Wt Readings from Last 3 Encounters:  01/18/17 193 lb (87.5 kg)  10/20/16 192 lb 6.4 oz (87.3 kg)  08/19/16 192 lb (87.1 kg)   Constitutional: overweight, truncal obesity, in NAD Eyes: PERRLA, EOMI, no exophthalmos ENT: moist mucous membranes, no thyromegaly, no cervical lymphadenopathy Cardiovascular: RRR, No MRG, + L LE edema - wears compression hoses Respiratory: CTA B Gastrointestinal: abdomen soft, NT, ND, BS+ Musculoskeletal: no deformities, strength  intact in all 4 Skin: moist, warm Neurological: no tremor with outstretched hands, DTR normal in all 4  ASSESSMENT: 1. DM2, non-insulin-dependent, uncontrolled, with complications and hypeglycemia - mild CKD  PLAN:  1. Patient with long-standing, previously uncontrolled diabetes, now on Metformin, Victoza, Glipizide, with blood sugars at goal at most checks. She had 1 low CBG since last visit >> 37 before dinner 2.5 mo ago. She is not sure what happened. No lows since. She has now different tabs for Glipizide ER >> cannot break them >> takes 1 full Glipizide XL 2.5 mg before dinner. Will continue this. I advised her to let me know immediately if she has any more lows. - I suggested to:  Patient Instructions  Please continue: - Metformin ER 1000 mg 2x a day. - Victoza 1.8 mg daily - Glipizide ER 2.5 mg 2x a day before meals (crush the pill before dinner)  Please return in 3 months with your sugar log.   - continue checking sugars at different times of the day - check 2 times a day, rotating checks - advised for yearly eye exams. She is UTD. - checked HbA1c today >> 6.9% (higher)  - Return to clinic in 3 mo with sugar log   Philemon Kingdom, MD PhD [facility]

## 2017-01-31 MED FILL — VICTOZA 18 MG/3 ML INJECT P: 18 | 30 days supply | Qty: 9 | Fill #7

## 2017-02-02 ENCOUNTER — Other Ambulatory Visit: Payer: Self-pay

## 2017-02-02 MED ORDER — GLIPIZIDE ER 2.5 MG PO TB24: 2.5000 mg | ORAL_TABLET | Freq: Two times a day (BID) | ORAL | 3 refills | Status: DC

## 2017-02-02 MED FILL — glipiZIDE ER 2.5 MG TB24: 2.5 | 90 days supply | Qty: 180 | Fill #0

## 2017-02-08 MED FILL — BUPROPION HCL XL 300 MG TAB: 300 | 30 days supply | Qty: 30 | Fill #1

## 2017-02-08 MED FILL — clonazePAM 0.5 MG TABS: 0.5 | 30 days supply | Qty: 90 | Fill #2

## 2017-02-10 ENCOUNTER — Telehealth: Payer: Self-pay | Admitting: Family Medicine

## 2017-02-10 NOTE — Telephone Encounter (Signed)
Left pt message asking to call Ebony Hail back directly at [phone] to schedule AWV.+ labs with Katha Cabal and CPE with PCP.

## 2017-02-21 MED FILL — BENAZEPRIL-HCTZ 20-12.5 MG: 20-12.5 | 30 days supply | Qty: 30 | Fill #7

## 2017-02-22 NOTE — Telephone Encounter (Signed)
Scheduled 03/07/17

## 2017-03-01 DIAGNOSIS — E119 Type 2 diabetes mellitus without complications: Secondary | ICD-10-CM | POA: Diagnosis not present

## 2017-03-01 DIAGNOSIS — H33012 Retinal detachment with single break, left eye: Secondary | ICD-10-CM | POA: Diagnosis not present

## 2017-03-07 ENCOUNTER — Encounter: Payer: Self-pay | Admitting: Family Medicine

## 2017-03-07 ENCOUNTER — Ambulatory Visit (INDEPENDENT_AMBULATORY_CARE_PROVIDER_SITE_OTHER): Payer: Medicare Other | Admitting: Family Medicine

## 2017-03-07 VITALS — BP 118/76 | HR 72 | Temp 97.7°F | Ht 59.5 in | Wt 192.0 lb

## 2017-03-07 DIAGNOSIS — Z0000 Encounter for general adult medical examination without abnormal findings: Secondary | ICD-10-CM

## 2017-03-07 DIAGNOSIS — M19011 Primary osteoarthritis, right shoulder: Secondary | ICD-10-CM | POA: Diagnosis not present

## 2017-03-07 DIAGNOSIS — E7800 Pure hypercholesterolemia, unspecified: Secondary | ICD-10-CM

## 2017-03-07 DIAGNOSIS — I10 Essential (primary) hypertension: Secondary | ICD-10-CM | POA: Diagnosis not present

## 2017-03-07 DIAGNOSIS — E1165 Type 2 diabetes mellitus with hyperglycemia: Secondary | ICD-10-CM | POA: Diagnosis not present

## 2017-03-07 DIAGNOSIS — E785 Hyperlipidemia, unspecified: Secondary | ICD-10-CM

## 2017-03-07 DIAGNOSIS — F325 Major depressive disorder, single episode, in full remission: Secondary | ICD-10-CM | POA: Diagnosis not present

## 2017-03-07 LAB — COMPREHENSIVE METABOLIC PANEL
ALT: 13 U/L (ref 0–35)
AST: 17 U/L (ref 0–37)
Albumin: 4.2 g/dL (ref 3.5–5.2)
Alkaline Phosphatase: 91 U/L (ref 39–117)
BUN: 35 mg/dL — AB (ref 6–23)
CHLORIDE: 108 meq/L (ref 96–112)
CO2: 26 meq/L (ref 19–32)
CREATININE: 1.3 mg/dL — AB (ref 0.40–1.20)
Calcium: 9.6 mg/dL (ref 8.4–10.5)
GFR: 43.27 mL/min — ABNORMAL LOW (ref >60.00)
Glucose, Bld: 84 mg/dL (ref 70–99)
POTASSIUM: 4.9 meq/L (ref 3.5–5.1)
Sodium: 140 mEq/L (ref 135–145)
Total Bilirubin: 0.5 mg/dL (ref 0.2–1.2)
Total Protein: 6.2 g/dL (ref 6.0–8.3)

## 2017-03-07 LAB — LIPID PANEL
CHOL/HDL RATIO: 5
Cholesterol: 162 mg/dL (ref 0–200)
HDL: 33.6 mg/dL — ABNORMAL LOW (ref >39.00)
NONHDL: 128.62
TRIGLYCERIDES: 286 mg/dL — AB (ref 0.0–149.0)
VLDL: 57.2 mg/dL — ABNORMAL HIGH (ref 0.0–40.0)

## 2017-03-07 LAB — LDL CHOLESTEROL, DIRECT: LDL DIRECT: 88 mg/dL

## 2017-03-07 LAB — TSH: TSH: 2.67 u[IU]/mL (ref 0.35–4.50)

## 2017-03-07 MED ORDER — BUPROPION HCL ER (XL) 300 MG PO TB24: 300.0000 mg | ORAL_TABLET | Freq: Every day | ORAL | 3 refills | Status: DC

## 2017-03-07 MED FILL — BUPROPION HCL XL 300 MG TAB: 300 | 90 days supply | Qty: 90 | Fill #0

## 2017-03-07 NOTE — Assessment & Plan Note (Signed)
Normotensive

## 2017-03-07 NOTE — Progress Notes (Signed)
Pre visit review using our clinic review tool, if applicable. No additional management support is needed unless otherwise documented below in the visit note. 

## 2017-03-07 NOTE — Patient Instructions (Signed)
Great to see you.  We will call you with your lab results.

## 2017-03-07 NOTE — Assessment & Plan Note (Signed)
Continue statin. Check labs today.

## 2017-03-07 NOTE — Progress Notes (Signed)
Subjective:   Patient ID: Brenda [name], female    DOB: 1951-01-23, 66 y.o.   MRN: [id number]  Brenda [name] is a pleasant 66 y.o. year old female who presents to clinic today with Medicare Wellness and Arthritis (Mainly in back and back of legs.)  on 03/07/2017  HPI:  I have personally reviewed the Medicare Annual Wellness questionnaire and have noted 1. The patient's medical and social history 2. Their use of alcohol, tobacco or illicit drugs 3. Their current medications and supplements 4. The patient's functional ability including ADL's, fall risks, home safety risks and hearing or visual             impairment. 5. Diet and physical activities 6. Evidence for depression or mood disorders  End of life wishes discussed and updated in Social History.  The roster of all physicians providing medical care to patient - is listed in the CareTeams section of the chart.  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 Was seeing ortho.  Has received cortisone injections earlier this year which were ineffective and caused her blood sugars to spike. She did finally have a shoulder replacement.  DM-  Now followed by Dr. Cruzita Lederer.  Was last seen on 01/18/17.  Note reviewed- a1c has been good.  No changes made to rxs. Lab Results  Component Value Date   HGBA1C 6.9 01/18/2017   Currently taking Victoza 0.6 twice daily,metformin 1000 mg twice daily and Glipizide 5 mg twice daily (Glipizide increased in 10/2014).    Wt Readings from Last 3 Encounters:  03/07/17 192 lb (87.1 kg)  01/18/17 193 lb (87.5 kg)  10/20/16 192 lb 6.4 oz (87.3 kg)    HLD- on Simvastatin. Denies myalgias.  Lab Results  Component Value Date   CHOL 125 07/21/2016   HDL 29.70 (L) 07/21/2016   LDLCALC 59 07/21/2016   TRIG 180.0 (H) 07/21/2016   CHOLHDL 4 07/21/2016     Anxiety/depression- has been under better control with current rxs- Wellbutrin XL 300 mg daily, .   Klonipin (but tries to take it only when anxiety is severe).  HTN- has been at goal for a diabetic on current rxs- lotensin HCT, Toprol XL.  Lab Results  Component Value Date   CREATININE 1.48 (H) 08/12/2016     Current Outpatient Prescriptions on File Prior to Visit  Medication Sig Dispense Refill  . aspirin 81 MG tablet Take 81 mg by mouth daily.    . benazepril-hydrochlorthiazide (LOTENSIN HCT) 20-12.5 MG tablet TAKE 1 TABLET BY MOUTH ONCE DAILY 30 tablet 11  . Blood Glucose Monitoring Suppl (FREESTYLE FREEDOM LITE) w/Device KIT Use to check blood sugar twice a day, E11.65 1 each 1  . buPROPion (WELLBUTRIN XL) 300 MG 24 hr tablet TAKE 1 TABLET BY MOUTH ONCE DAILY 30 tablet 2  . Calcium-Magnesium-Vitamin D (CALCIUM 500 PO) Take by mouth 2 (two) times daily.    . cholecalciferol (VITAMIN D) 1000 UNITS tablet Take 2,000 Units by mouth daily.     . clonazePAM (KLONOPIN) 0.5 MG tablet TAKE 1 TABLET BY MOUTH 3 TIMES DAILY AS NEEDED FOR ANXIETY 90 tablet 3  . Esomeprazole Magnesium (NEXIUM PO) Take 22.5 mg by mouth daily.    Marland Kitchen gabapentin (NEURONTIN) 300 MG capsule TAKE 1-2 CAPSULES BY MOUTH AS NEEDED AT BEDTIME FOR RLS. 270 capsule 1  . glipiZIDE (GLUCOTROL XL) 2.5 MG 24 hr tablet  Take 1 tablet (2.5 mg total) by mouth 2 (two) times daily before a meal. 180 tablet 3  . glucose blood (FREESTYLE LITE) test strip Use to check blood sugar twice a day, E11.65 100 each 12  . Lancets (FREESTYLE) lancets Use to check blood sugar twice a day, E11.65 100 each 12  . liraglutide (VICTOZA) 18 MG/3ML SOPN Inject 0.3 mLs (1.8 mg total) into the skin daily. 27 mL 1  . metFORMIN (GLUCOPHAGE-XR) 500 MG 24 hr tablet Take 2 tablets (1,000 mg total) by mouth 2 (two) times daily with a meal. 360 tablet 3  . metoprolol succinate (TOPROL-XL) 25 MG 24 hr tablet Take 1 tablet (25 mg total) by mouth daily. 90 tablet 1   . Multiple Vitamins-Minerals (HM MULTIVITAMIN ADULT GUMMY PO) Take 1 tablet by mouth daily.    . simvastatin (ZOCOR) 40 MG tablet TAKE 1 TABLET BY MOUTH AT BEDTIME. 90 tablet 3  . UNIFINE PENTIPS 31G X 8 MM MISC USE AS DIRECTED WITH VICTOZA 100 each 88   No current facility-administered medications on file prior to visit.     Allergies  Allergen Reactions  . No Known Allergies     Past Medical History:  Diagnosis Date  . Anxiety   . Arthritis   . Depression    Wellbutrin  . Diabetes mellitus    Type II  . Endometrial ca ([place])    1998  . 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 GERD (gastroesophageal reflux disease)   . Heart murmur    patient states "cardiologist has not heard heart murmur for past several years"  . Heel spur   . Hyperlipidemia   . Hypertension   . Left leg swelling    "swelling in left leg from knee down when sitting for prolonged periods or being on feet for a long period of time"  . PONV (postoperative nausea and vomiting)    after hysterectomy    Past Surgical History:  Procedure Laterality Date  . ABDOMINAL HYSTERECTOMY  1998  . COLONOSCOPY    . EYE SURGERY Bilateral    cataracts  . EYE SURGERY Left    retina tear  . FEMUR IM NAIL  10/25/2012   Procedure: INTRAMEDULLARY (IM) NAIL FEMORAL;  Surgeon: Mauri Pole, MD;  Location: [facility];  Service: Orthopedics;  Laterality: Left;  . HIP SURGERY    . left knee arthroscopy  12/2010  . REFRACTIVE SURGERY    . TOTAL KNEE ARTHROPLASTY  12/20/2011   Procedure: TOTAL KNEE ARTHROPLASTY;  Surgeon: Gearlean Alf, MD;  Location: [facility];  Service: Orthopedics;  Laterality: Left;  Failed attempt at spinal   . TOTAL SHOULDER ARTHROPLASTY Right 08/19/2016   Procedure: RIGHT TOTAL SHOULDER ARTHROPLASTY;  Surgeon: Justice Britain, MD;  Location: [facility];  Service: Orthopedics;  Laterality: Right;    Family History  Problem Relation Age of Onset  . Cancer Mother     breast  cancer  . Cancer Father     plasma sarcoma    Social History   Social History  . Marital status: Single    Spouse name: N/A  . Number of children: N/A  . Years of education: N/A   Occupational History  . RN at [employer]   Social History Main Topics  . Smoking status: Never Smoker  . Smokeless tobacco: Never Used  . Alcohol use No  . Drug use: No  .  Sexual activity: Not on file   Other Topics Concern  . 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    Review of Systems  Constitutional: Negative.   HENT: Negative.   Respiratory: Negative.   Cardiovascular: Negative.   Gastrointestinal: Negative.   Endocrine: Negative.   Musculoskeletal: Positive for arthralgias. Negative for myalgias, neck pain and neck stiffness.  Skin: Negative.   Neurological: Negative.   Psychiatric/Behavioral: Negative.      Objective:    BP 118/76 (BP Location: Left Arm, Patient Position: Sitting, Cuff Size: Large)   Pulse 72   Temp 97.7 F (36.5 C) (Oral)   Ht 4' 11.5" (1.511 m)   Wt 192 lb (87.1 kg)   SpO2 98%   BMI 38.13 kg/m   Wt Readings from Last 3 Encounters:  03/07/17 192 lb (87.1 kg)  01/18/17 193 lb (87.5 kg)  10/20/16 192 lb 6.4 oz (87.3 kg)     Physical Exam  Constitutional: She is oriented to person, place, and time. She appears well-developed and well-nourished. No distress.  HENT:  Head: Normocephalic.  Eyes: Conjunctivae are normal.  Cardiovascular: Normal rate.   Pulmonary/Chest: Effort normal.  Musculoskeletal: Normal range of motion. She exhibits no edema.  Neurological: She is alert and oriented to person, place, and time. No cranial nerve deficit.  Skin: Skin is warm and dry.  Psychiatric: She has a normal mood and affect. Her behavior is normal. Judgment normal.    Nursing note and vitals reviewed.      Assessment & Plan:   No diagnosis found. No Follow-up on file.

## 2017-03-07 NOTE — Assessment & Plan Note (Signed)
Well controlled on current rxs. No changes made.

## 2017-03-07 NOTE — Assessment & Plan Note (Signed)
The patients weight, height, BMI and visual acuity have been recorded in the chart.  Cognitive function assessed.   I have made referrals, counseling and provided education to the patient based review of the above and I have provided the pt with a written personalized care plan for preventive services.  Labs today.

## 2017-03-08 DIAGNOSIS — H33012 Retinal detachment with single break, left eye: Secondary | ICD-10-CM | POA: Diagnosis not present

## 2017-03-08 DIAGNOSIS — Z961 Presence of intraocular lens: Secondary | ICD-10-CM | POA: Diagnosis not present

## 2017-03-08 DIAGNOSIS — E119 Type 2 diabetes mellitus without complications: Secondary | ICD-10-CM | POA: Diagnosis not present

## 2017-03-08 LAB — HM DIABETES EYE EXAM

## 2017-03-16 ENCOUNTER — Telehealth: Payer: Self-pay

## 2017-03-16 MED ORDER — GABAPENTIN 600 MG PO TABS: 600.0000 mg | ORAL_TABLET | Freq: Three times a day (TID) | ORAL | 3 refills | Status: DC

## 2017-03-16 MED ORDER — GABAPENTIN 300 MG PO CAPS: ORAL_CAPSULE | ORAL | 1 refills | Status: DC

## 2017-03-16 MED FILL — GABAPENTIN 600 MG TABLET: 600 | 30 days supply | Qty: 90 | Fill #0

## 2017-03-16 NOTE — Telephone Encounter (Signed)
eRx sent as requested.

## 2017-03-16 NOTE — Telephone Encounter (Signed)
Pt left v/m; pt was seen 03/07/17; gabapentin was increased to 600 mg tid and seems to be working well.pt request new rx to [facility] outpt pharmacy with new instructions. I reviewed 03/07/17 note and did not find increase in gabapentin.Please advise.

## 2017-03-16 NOTE — Telephone Encounter (Signed)
Audrea Muscat from [facility] wants to verify if pt should get gabapentin 300 mg or 600 mg rx. Advised Audrea Muscat that Dr Deborra Medina just sent in the 600 mg rx and d/c the 300 mg rx. Mary ann voiced understanding.

## 2017-03-17 MED FILL — BENAZEPRIL-HCTZ 20-12.5 MG: 20-12.5 | 30 days supply | Qty: 30 | Fill #8

## 2017-03-29 MED FILL — VICTOZA 18 MG/3 ML INJECT P: 18 | 30 days supply | Qty: 9 | Fill #8

## 2017-03-29 MED FILL — clonazePAM 0.5 MG TABS: 0.5 | 30 days supply | Qty: 90 | Fill #3

## 2017-04-03 DIAGNOSIS — I2510 Atherosclerotic heart disease of native coronary artery without angina pectoris: Secondary | ICD-10-CM | POA: Insufficient documentation

## 2017-04-03 DIAGNOSIS — I25118 Atherosclerotic heart disease of native coronary artery with other forms of angina pectoris: Secondary | ICD-10-CM | POA: Insufficient documentation

## 2017-04-03 NOTE — Progress Notes (Signed)
Patient ID: Brenda [name], female   DOB: 06-Jun-1951, 66 y.o.   MRN: [id number] Cardiology Office Note  Date:  04/04/2017   ID:  Brenda, [name] 1951/08/10, MRN [id number]  PCP:  Brenda Passy, MD   Chief Complaint  Patient presents with  . other    12 month follow up. Meds reviewed by the pt. verbally. Pt. c/o shortness of breath and wheezing at times.     HPI:  [name] is a  68 -year-old woman with history of coronary artery disease, CT coronary calcium score 1031 obesity, diabetes, poorly controlled, hemoglobin A1c previously of 12 down to 6.8 hyperlipidemia,  with previous stress test and echocardiogram for abnormal EKG,  who presents for routine followup of her coronary artery disease and hyperlipidemia.  Sugars well down,  New symptoms of Waking up wheezing, SOB Better in the day Worse in past 2 to 3 weeks Wonders if she has asthma She does have swelling in her left leg greater than right leg, chronic issue   Cramping in legs this has been going on for years,  Every night, better with gabapentin but still having cramps   shoulder replacement surgery went well   no regular exercise program  She has been watching her diet closely Continues to work in the hospital, short stay at [facility]  EKG personally reviewed by myself on todays visit shows normal sinus rhythm with rate 80 bpm, no significant ST or T-wave changes   other past medical history reviewed  CT coronary calcium score showing diffuse LAD disease, RCA disease Dramatically less left circumflex disease   PMH:   has a past medical history of Anxiety; Arthritis; Depression; Diabetes mellitus; Endometrial ca [facility]); Foot fracture, left; GERD (gastroesophageal reflux disease); Heart murmur; Heel spur; Hyperlipidemia; Hypertension; Left leg swelling; and PONV (postoperative nausea and vomiting).  PSH:    Past Surgical History:  Procedure Laterality Date  . ABDOMINAL HYSTERECTOMY  1998  . COLONOSCOPY     . EYE SURGERY Bilateral    cataracts  . EYE SURGERY Left    retina tear  . FEMUR IM NAIL  10/25/2012   Procedure: INTRAMEDULLARY (IM) NAIL FEMORAL;  Surgeon: Mauri Pole, MD;  Location: [facility];  Service: Orthopedics;  Laterality: Left;  . HIP SURGERY    . left knee arthroscopy  12/2010  . REFRACTIVE SURGERY    . TOTAL KNEE ARTHROPLASTY  12/20/2011   Procedure: TOTAL KNEE ARTHROPLASTY;  Surgeon: Gearlean Alf, MD;  Location: [facility];  Service: Orthopedics;  Laterality: Left;  Failed attempt at spinal   . TOTAL SHOULDER ARTHROPLASTY Right 08/19/2016   Procedure: RIGHT TOTAL SHOULDER ARTHROPLASTY;  Surgeon: Justice Britain, MD;  Location: [place];  Service: Orthopedics;  Laterality: Right;    Current Outpatient Prescriptions  Medication Sig Dispense Refill  . aspirin 81 MG tablet Take 81 mg by mouth daily.    . benazepril-hydrochlorthiazide (LOTENSIN HCT) 20-12.5 MG tablet TAKE 1 TABLET BY MOUTH ONCE DAILY 30 tablet 11  . Blood Glucose Monitoring Suppl (FREESTYLE FREEDOM LITE) w/Device KIT Use to check blood sugar twice a day, E11.65 1 each 1  . buPROPion (WELLBUTRIN XL) 300 MG 24 hr tablet Take 1 tablet (300 mg total) by mouth daily. 90 tablet 3  . Calcium-Magnesium-Vitamin D (CALCIUM 500 PO) Take by mouth 2 (two) times daily.    . cholecalciferol (VITAMIN D) 1000 UNITS tablet Take 2,000 Units by mouth daily.     . clonazePAM (KLONOPIN) 0.5 MG  tablet TAKE 1 TABLET BY MOUTH 3 TIMES DAILY AS NEEDED FOR ANXIETY 90 tablet 3  . Esomeprazole Magnesium (NEXIUM PO) Take 22.5 mg by mouth daily.    Marland Kitchen gabapentin (NEURONTIN) 600 MG tablet Take 1 tablet (600 mg total) by mouth 3 (three) times daily. 90 tablet 3  . glipiZIDE (GLUCOTROL XL) 2.5 MG 24 hr tablet Take 1 tablet (2.5 mg total) by mouth 2 (two) times daily before a meal. 180 tablet 3  . glucose blood (FREESTYLE LITE) test strip Use to check blood sugar twice a day, E11.65 100 each 12  . Lancets (FREESTYLE) lancets Use to check blood sugar twice  a day, E11.65 100 each 12  . liraglutide (VICTOZA) 18 MG/3ML SOPN Inject 0.3 mLs (1.8 mg total) into the skin daily. 27 mL 1  . metFORMIN (GLUCOPHAGE-XR) 500 MG 24 hr tablet Take 2 tablets (1,000 mg total) by mouth 2 (two) times daily with a meal. 360 tablet 3  . metoprolol succinate (TOPROL-XL) 25 MG 24 hr tablet Take 1 tablet (25 mg total) by mouth daily. 90 tablet 1  . Multiple Vitamins-Minerals (HM MULTIVITAMIN ADULT GUMMY PO) Take 1 tablet by mouth daily.    . simvastatin (ZOCOR) 40 MG tablet TAKE 1 TABLET BY MOUTH AT BEDTIME. 90 tablet 3  . UNIFINE PENTIPS 31G X 8 MM MISC USE AS DIRECTED WITH VICTOZA 100 each 88   No current facility-administered medications for this visit.      Allergies:   No known allergies   Social History:  The patient  reports that she has never smoked. She has never used smokeless tobacco. She reports that she does not drink alcohol or use drugs.   Family History:   family history includes Cancer in her father and mother.    Review of Systems: Review of Systems  Constitutional: Negative.   Respiratory: Negative.   Cardiovascular: Positive for leg swelling.  Gastrointestinal: Negative.   Musculoskeletal: Negative.        Severe shoulder discomfort  Neurological: Negative.   Psychiatric/Behavioral: Negative.   All other systems reviewed and are negative.    PHYSICAL EXAM: VS:  BP 140/64 (BP Location: Left Arm, Patient Position: Sitting, Cuff Size: Normal)   Pulse 80   Ht 5' (1.524 m)   Wt 196 lb 4 oz (89 kg)   BMI 38.33 kg/m  , BMI Body mass index is 38.33 kg/m.  GEN: Well nourished, well developed, in no acute distress , Obese HEENT: normal  Neck: no JVD, carotid bruits, or masses Cardiac: RRR; no murmurs, rubs, or gallops, left > right leg trace edema Respiratory:  clear to auscultation bilaterally, normal work of breathing GI: soft, nontender, nondistended, + BS MS: no deformity or atrophy  Skin: warm and dry, no rash Neuro:  Strength  and sensation are intact Psych: euthymic mood, full affect    Recent Labs: 08/12/2016: Hemoglobin 11.7; Platelets 231 03/07/2017: ALT 13; BUN 35; Creatinine, Ser 1.30; Potassium 4.9; Sodium 140; TSH 2.67    Lipid Panel Lab Results  Component Value Date   CHOL 162 03/07/2017   HDL 33.60 (L) 03/07/2017   LDLCALC 59 07/21/2016   TRIG 286.0 (H) 03/07/2017      Wt Readings from Last 3 Encounters:  04/04/17 196 lb 4 oz (89 kg)  03/07/17 192 lb (87.1 kg)  01/18/17 193 lb (87.5 kg)       ASSESSMENT AND PLAN:  Essential hypertension -  Blood pressure well controlled on today's visit. No changes made  to her medications  Tachycardia -   continue metoprolol   Type 2 diabetes mellitus with hyperglycemia, without long-term current use of insulin (HCC) Dramatic improvement in her diabetes numbers We have congratulated her, hemoglobin A1c dramatically improved over the past year Recommended she continue to follow strict diet, increase her exercise for weight loss  Obesity  encouraged continued exercise, careful diet management in an effort to lose weight.  HLD (hyperlipidemia) Having leg cramping almost nightly   recommended she hold the simvastatin for several weeks if her symptoms resolve   if they do potentially was started on low-dose Crestor potentially with Zetia   Disposition:   F/U  12 months   Total encounter time more than 25 minutes  Greater than 50% was spent in counseling and coordination of care with the patient    Orders Placed This Encounter  Procedures  . EKG 12-Lead   Signed, Esmond Plants, M.D., Ph.D. 04/04/2017  [place], [place]

## 2017-04-04 ENCOUNTER — Encounter: Payer: Self-pay | Admitting: Cardiovascular Disease

## 2017-04-04 ENCOUNTER — Ambulatory Visit (INDEPENDENT_AMBULATORY_CARE_PROVIDER_SITE_OTHER): Payer: Medicare Other | Admitting: Cardiovascular Disease

## 2017-04-04 VITALS — BP 140/64 | HR 80 | Ht 60.0 in | Wt 196.2 lb

## 2017-04-04 DIAGNOSIS — I25118 Atherosclerotic heart disease of native coronary artery with other forms of angina pectoris: Secondary | ICD-10-CM | POA: Diagnosis not present

## 2017-04-04 DIAGNOSIS — I209 Angina pectoris, unspecified: Secondary | ICD-10-CM

## 2017-04-04 DIAGNOSIS — E782 Mixed hyperlipidemia: Secondary | ICD-10-CM

## 2017-04-04 DIAGNOSIS — E1165 Type 2 diabetes mellitus with hyperglycemia: Secondary | ICD-10-CM | POA: Diagnosis not present

## 2017-04-04 MED ORDER — FUROSEMIDE 20 MG PO TABS: 20.0000 mg | ORAL_TABLET | Freq: Every day | ORAL | 3 refills | Status: DC | PRN

## 2017-04-04 MED FILL — FUROSEMIDE 20 MG TABLET: 20 | 90 days supply | Qty: 90 | Fill #0

## 2017-04-04 NOTE — Patient Instructions (Addendum)
Medication Instructions:   Please try lasix/furosemide as needed for leg swelling on the right, shortness of breath  Hold the simvastatin to see if cramps get better If no better restart simvastatin If better, we would try alternate medication  Labwork:  No new labs needed  Testing/Procedures:  No further testing at this time   I recommend watching educational videos on topics of interest to you at:       [email or web address]  Enter code: [email or web address]    Follow-Up: It was a pleasure seeing you in the office today. Please call us if you have new issues that need to be addressed before your next appt.  [phone]  Your physician wants you to follow-up in: 12 months.  You will receive a reminder letter in the mail two months in advance. If you don't receive a letter, please call our office to schedule the follow-up appointment.  If you need a refill on your cardiac medications before your next appointment, please call your pharmacy.

## 2017-04-11 ENCOUNTER — Other Ambulatory Visit: Payer: Self-pay

## 2017-04-11 MED ORDER — BENAZEPRIL-HYDROCHLOROTHIAZIDE 20-12.5 MG PO TABS: 1.0000 | ORAL_TABLET | Freq: Every day | ORAL | 3 refills | Status: DC

## 2017-04-11 MED FILL — BENAZEPRIL-HCTZ 20-12.5 MG: 20-12.5 | 90 days supply | Qty: 90 | Fill #0

## 2017-04-26 MED FILL — METFORMIN HCL ER 500 MG TAB: 500 | 90 days supply | Qty: 360 | Fill #2

## 2017-04-26 MED FILL — glipiZIDE ER 2.5 MG TB24: 2.5 | 90 days supply | Qty: 180 | Fill #1

## 2017-04-26 MED FILL — METOPROLOL SUCC ER 25 MG TA: 25 | 90 days supply | Qty: 90 | Fill #1

## 2017-04-27 ENCOUNTER — Other Ambulatory Visit: Payer: Self-pay | Admitting: Family Medicine

## 2017-04-27 MED FILL — GABAPENTIN 600 MG TABLET: 600 | 30 days supply | Qty: 90 | Fill #1

## 2017-04-28 MED FILL — clonazePAM 0.5 MG TABS: 0.5 | 30 days supply | Qty: 90 | Fill #0

## 2017-04-28 NOTE — Telephone Encounter (Signed)
Faxed to  [facility], [place].  [address] [place] [address]  Phone: [phone] Fax: [phone]

## 2017-04-28 NOTE — Telephone Encounter (Signed)
Last refill 03/29/17 Last OV 03/07/17 Ok to refill?

## 2017-05-12 MED FILL — UNIFINE PENTIPS 8MM 31G: 31G X 8 MM | 90 days supply | Qty: 100 | Fill #1

## 2017-05-13 ENCOUNTER — Encounter: Payer: Self-pay | Admitting: Internal Medicine

## 2017-05-13 ENCOUNTER — Ambulatory Visit (INDEPENDENT_AMBULATORY_CARE_PROVIDER_SITE_OTHER): Payer: Medicare Other | Admitting: Internal Medicine

## 2017-05-13 VITALS — BP 132/64 | HR 93 | Ht 60.0 in | Wt 191.0 lb

## 2017-05-13 DIAGNOSIS — I25118 Atherosclerotic heart disease of native coronary artery with other forms of angina pectoris: Secondary | ICD-10-CM

## 2017-05-13 DIAGNOSIS — E1165 Type 2 diabetes mellitus with hyperglycemia: Secondary | ICD-10-CM | POA: Diagnosis not present

## 2017-05-13 LAB — POCT GLYCOSYLATED HEMOGLOBIN (HGB A1C): HEMOGLOBIN A1C: 7.5

## 2017-05-13 MED ORDER — FREESTYLE LANCETS MISC: 12 refills | Status: DC

## 2017-05-13 MED ORDER — GLUCOSE BLOOD VI STRP: ORAL_STRIP | 12 refills | Status: DC

## 2017-05-13 MED ORDER — LIRAGLUTIDE 18 MG/3ML ~~LOC~~ SOPN: 1.8000 mg | PEN_INJECTOR | Freq: Every day | SUBCUTANEOUS | 3 refills | Status: DC

## 2017-05-13 NOTE — Patient Instructions (Addendum)
Please continue: - Victoza 1.8 mg daily - Glipizide ER 2.5 mg 2x a day before meals  Please move all Metformin ER at dinnertime (2000 mg).  Please return in 3 months with your sugar log.

## 2017-05-13 NOTE — Progress Notes (Signed)
Patient ID: Brenda [name], female   DOB: [birth year], 66 y.o.   MRN: [id number]  HPI: Brenda [name] is a 66 y.o.-year-old female, returning for follow-up for DM2, dx in 2007, non-insulin-dependent, uncontrolled, with complications (mild CKD). Last visit 4 months ago.  Last hemoglobin A1c was: Lab Results  Component Value Date   HGBA1C 6.9 01/18/2017   HGBA1C 6.6 10/20/2016   HGBA1C 7.4 07/21/2016   Pt is on a regimen of: - Metformin ER 1000 mg 2x a day. - Victoza 1.8 mg daily - Glipizide ER 2.5 mg 2x a day before meals (tried, but could not crush the pill before dinner) She was on Glipizide in 11/2015 after a steroid inj. At last visit, we stopped Glipizide 5 mg 2x a day, before meals, because of lows.  Pt checks her sugars 2-3x a day: - am: 86, 114-152, 158 >> 71, 79-129, 171 >> 104-147, 154, 178 >> 92-160, 172 (more rice at night) - 2h after b'fast: 114 >> 105, 129 >> n/c >> 156 >> n/c - before lunch: 62x1, 72-127, 170 >> 62, 85-134, 153 >> 63, 114-121, 148 >> 89 - 2h after lunch: 71 >> 64-138 >> 64, 81-138 >> n/c - before dinner:88-140, 169, 180 >> 50, 59, 79-158 >> 37 x1, 61-124, 163 >> 66-127, 150, 165 - 2h after dinner: n/c >> 147 >> 151 >> 180 >> n/c - bedtime: n/c >> 89-158 >> n/c >> 132-207 >> 74, 80-170  - nighttime: n/c No lows. Lowest sugar was 37 x1 >> 66; she has hypoglycemia awareness at 70.  Highest sugar was  178 >> 175.  Glucometer: Freestyle Lite  Pt's meals are: - Breakfast: English muffin + preserve or cereal or yoghurt - snack: fruit or fruit + yoghurt - Lunch: 1/2 sandwich + chips + salsa + fruit/sugar free - Dinner: soup or chicken/steak + veggies, occasional starch or Lean cuisine or light salad  - + mild CKD, last BUN/creatinine:  Lab Results  Component Value Date   BUN 35 (H) 03/07/2017   CREATININE 1.30 (H) 03/07/2017  On Benazepril. - last set of lipids: Lab Results  Component Value Date   CHOL 162 03/07/2017   HDL 33.60 (L)  03/07/2017   LDLCALC 59 07/21/2016   LDLDIRECT 88.0 03/07/2017   TRIG 286.0 (H) 03/07/2017   CHOLHDL 5 03/07/2017  Stopped Simvastatin x 1 mo b/c leg cramps >> did not notice a big difference in cramps after she stopped. - last eye exam was on 03/2017. No DR. Cataract sx in 12/2014. - Denies numbness and tingling in her feet. On Gabapentin for leg cramps at night. On ASA 81.  She also has HTN and HL, GERD, depression.  ROS: Constitutional: no weight gain/no weight loss, no fatigue, no subjective hyperthermia, no subjective hypothermia Eyes: no blurry vision, no xerophthalmia ENT: no sore throat, no nodules palpated in throat, no dysphagia, no odynophagia, no hoarseness Cardiovascular: no CP/no SOB/no palpitations/no leg swelling Respiratory: no cough/no SOB/no wheezing Gastrointestinal: no N/no V/no D/no C/no acid reflux Musculoskeletal: + muscle aches/no joint aches Skin: no rashes, no hair loss Neurological: no tremors/no numbness/no tingling/no dizziness  I reviewed pt's medications, allergies, PMH, social hx, family hx, and changes were documented in the history of present illness. Otherwise, unchanged from my initial visit note.   Past Medical History:  Diagnosis Date  . Anxiety   . Arthritis   . Depression    Wellbutrin  . Diabetes mellitus    Type II  . Endometrial  ca ([place])    1998  . 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 GERD (gastroesophageal reflux disease)   . Heart murmur    patient states "cardiologist has not heard heart murmur for past several years"  . Heel spur   . Hyperlipidemia   . Hypertension   . Left leg swelling    "swelling in left leg from knee down when sitting for prolonged periods or being on feet for a long period of time"  . PONV (postoperative nausea and vomiting)    after hysterectomy   Past Surgical History:  Procedure Laterality Date  . ABDOMINAL HYSTERECTOMY  1998  . COLONOSCOPY    . EYE  SURGERY Bilateral    cataracts  . EYE SURGERY Left    retina tear  . FEMUR IM NAIL  10/25/2012   Procedure: INTRAMEDULLARY (IM) NAIL FEMORAL;  Surgeon: Mauri Pole, MD;  Location: [facility];  Service: Orthopedics;  Laterality: Left;  . HIP SURGERY    . left knee arthroscopy  12/2010  . REFRACTIVE SURGERY    . TOTAL KNEE ARTHROPLASTY  12/20/2011   Procedure: TOTAL KNEE ARTHROPLASTY;  Surgeon: Gearlean Alf, MD;  Location: [facility];  Service: Orthopedics;  Laterality: Left;  Failed attempt at spinal   . TOTAL SHOULDER ARTHROPLASTY Right 08/19/2016   Procedure: RIGHT TOTAL SHOULDER ARTHROPLASTY;  Surgeon: Justice Britain, MD;  Location: [facility];  Service: Orthopedics;  Laterality: Right;   Social History   Social History  . Marital Status: Single    Spouse Name: N/A  . Number of Children: 0   Occupational History  . RN at [employer] History Main Topics  . Smoking status: Never Smoker   . Smokeless tobacco: Never Used  . Alcohol Use: No  . 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 aspirin 81 MG tablet Take 81 mg by mouth daily.    . benazepril-hydrochlorthiazide (LOTENSIN HCT) 20-12.5 MG tablet Take 1 tablet by mouth daily. 90 tablet 3  . Blood Glucose Monitoring Suppl (FREESTYLE FREEDOM LITE) w/Device KIT Use to check blood sugar twice a day, E11.65 1 each 1  . buPROPion (WELLBUTRIN XL) 300 MG 24 hr tablet Take 1 tablet (300 mg total) by mouth daily. 90 tablet 3  . Calcium-Magnesium-Vitamin D (CALCIUM 500 PO) Take by mouth 2 (two) times daily.    . cholecalciferol (VITAMIN D) 1000 UNITS tablet Take 2,000 Units by mouth daily.     . clonazePAM (KLONOPIN) 0.5 MG tablet TAKE 1 TABLET BY MOUTH 3 TIMES DAILY AS NEEDED FOR ANXIETY 90 tablet 3  . Esomeprazole Magnesium (NEXIUM PO) Take 22.5 mg by mouth daily.    . furosemide (LASIX) 20 MG tablet Take 1 tablet (20 mg  total) by mouth daily as needed. 90 tablet 3  . gabapentin (NEURONTIN) 600 MG tablet Take 1 tablet (600 mg total) by mouth 3 (three) times daily. 90 tablet 3  . glipiZIDE (GLUCOTROL XL) 2.5 MG 24 hr tablet Take 1 tablet (2.5 mg total) by mouth 2 (two) times daily before a meal. 180 tablet 3  . glucose blood (FREESTYLE LITE) test strip Use to check blood sugar twice a day, E11.65 100 each 12  . Lancets (FREESTYLE) lancets Use to check blood sugar twice a day, E11.65 100  each 12  . liraglutide (VICTOZA) 18 MG/3ML SOPN Inject 0.3 mLs (1.8 mg total) into the skin daily. 27 mL 1  . metFORMIN (GLUCOPHAGE-XR) 500 MG 24 hr tablet Take 2 tablets (1,000 mg total) by mouth 2 (two) times daily with a meal. 360 tablet 3  . metoprolol succinate (TOPROL-XL) 25 MG 24 hr tablet Take 1 tablet (25 mg total) by mouth daily. 90 tablet 1  . Multiple Vitamins-Minerals (HM MULTIVITAMIN ADULT GUMMY PO) Take 1 tablet by mouth daily.    . simvastatin (ZOCOR) 40 MG tablet TAKE 1 TABLET BY MOUTH AT BEDTIME. 90 tablet 3  . UNIFINE PENTIPS 31G X 8 MM MISC USE AS DIRECTED WITH VICTOZA 100 each 88   No current facility-administered medications on file prior to visit.    Allergies  Allergen Reactions  . No Known Allergies    Family History  Problem Relation Age of Onset  . Cancer Mother        breast cancer  . Cancer Father        plasma sarcoma   PE: BP 132/64   Pulse 93   Ht 5' (1.524 m)   Wt 191 lb (86.6 kg)   SpO2 94%   BMI 37.30 kg/m  Body mass index is 37.3 kg/m. Wt Readings from Last 3 Encounters:  05/13/17 191 lb (86.6 kg)  04/04/17 196 lb 4 oz (89 kg)  03/07/17 192 lb (87.1 kg)   Constitutional: overweight, in NAD Eyes: PERRLA, EOMI, no exophthalmos ENT: moist mucous membranes, no thyromegaly, no cervical lymphadenopathy Cardiovascular: RRR at the end of the appt, No MRG Respiratory: CTA B Gastrointestinal: abdomen soft, NT, ND, BS+ Musculoskeletal: no deformities, strength intact in all 4 Skin:  moist, warm, no rashes Neurological: no tremor with outstretched hands, DTR normal in all 4  ASSESSMENT: 1. DM2, non-insulin-dependent, uncontrolled, with complications and hyperglycemia - mild CKD  PLAN:  1. Patient with long-standing, previously uncontrolled diabetes, on oral diabetic regimen + GLP1 R agonist, with worse sugars at this visit, but only in am. She mentions eating more rice at night (white, does not like brown). Her sugars before dinner are lower as she does not always have time for lunch at work. - will try to move all Metformin with dinner in an attempt to improve am sugars - I suggested to:  Patient Instructions  Please continue: - Victoza 1.8 mg daily - Glipizide ER 2.5 mg 2x a day before meals  Please move all Metformin ER at dinnertime (2000 mg).  Please return in 3 months with your sugar log.   - today, HbA1c is 7.5% (higher) - continue checking sugars at different times of the day - check 2x a day, rotating checks - advised for yearly eye exams >> she is UTD - Return to clinic in 3 mo with sugar log    Philemon Kingdom, MD PhD [facility]

## 2017-05-17 MED FILL — VICTOZA 18 MG/3 ML INJECT P: 18 | 30 days supply | Qty: 9 | Fill #0

## 2017-06-13 ENCOUNTER — Other Ambulatory Visit: Payer: Self-pay | Admitting: Cardiovascular Disease

## 2017-06-13 MED FILL — GABAPENTIN 600 MG TABLET: 600 | 30 days supply | Qty: 90 | Fill #2

## 2017-06-13 MED FILL — SIMVASTATIN 40 MG TABLET: 40 | 90 days supply | Qty: 90 | Fill #0

## 2017-06-29 MED FILL — clonazePAM 0.5 MG TABS: 0.5 | 30 days supply | Qty: 90 | Fill #1

## 2017-06-29 MED FILL — BUPROPION HCL XL 300 MG TAB: 300 | 90 days supply | Qty: 90 | Fill #1

## 2017-07-19 MED FILL — VICTOZA 18 MG/3 ML INJECT P: 18 | 30 days supply | Qty: 9 | Fill #1

## 2017-07-19 MED FILL — glipiZIDE ER 2.5 MG TB24: 2.5 | 90 days supply | Qty: 180 | Fill #2

## 2017-07-19 MED FILL — GABAPENTIN 600 MG TABLET: 600 | 30 days supply | Qty: 90 | Fill #3

## 2017-07-26 MED FILL — clonazePAM 0.5 MG TABS: 0.5 | 30 days supply | Qty: 90 | Fill #2

## 2017-07-26 MED FILL — BENAZEPRIL-HCTZ 20-12.5 MG: 20-12.5 | 90 days supply | Qty: 90 | Fill #1

## 2017-08-09 ENCOUNTER — Other Ambulatory Visit: Payer: Self-pay | Admitting: Family Medicine

## 2017-08-09 DIAGNOSIS — Z1231 Encounter for screening mammogram for malignant neoplasm of breast: Secondary | ICD-10-CM

## 2017-08-10 ENCOUNTER — Other Ambulatory Visit: Payer: Self-pay | Admitting: Cardiovascular Disease

## 2017-08-10 MED FILL — METFORMIN HCL ER 500 MG TAB: 500 | 90 days supply | Qty: 360 | Fill #3

## 2017-08-10 MED FILL — METOPROLOL SUCC ER 25 MG TA: 25 | 90 days supply | Qty: 90 | Fill #0

## 2017-08-17 ENCOUNTER — Other Ambulatory Visit: Payer: Self-pay

## 2017-08-17 MED ORDER — LIRAGLUTIDE 18 MG/3ML ~~LOC~~ SOPN: 1.8000 mg | PEN_INJECTOR | Freq: Every day | SUBCUTANEOUS | 3 refills | Status: DC

## 2017-08-17 MED FILL — VICTOZA 18 MG/3 ML INJECT P: 18 | 30 days supply | Qty: 9 | Fill #0

## 2017-08-22 ENCOUNTER — Ambulatory Visit (INDEPENDENT_AMBULATORY_CARE_PROVIDER_SITE_OTHER): Payer: Medicare Other | Admitting: Family Medicine

## 2017-08-22 ENCOUNTER — Ambulatory Visit (INDEPENDENT_AMBULATORY_CARE_PROVIDER_SITE_OTHER): Payer: Medicare Other | Admitting: Internal Medicine

## 2017-08-22 ENCOUNTER — Encounter: Payer: Self-pay | Admitting: Internal Medicine

## 2017-08-22 ENCOUNTER — Encounter: Payer: Self-pay | Admitting: Family Medicine

## 2017-08-22 VITALS — BP 102/62 | HR 101 | Temp 98.0°F | Ht 60.0 in | Wt 181.1 lb

## 2017-08-22 VITALS — BP 128/72 | HR 106 | Wt 182.0 lb

## 2017-08-22 DIAGNOSIS — G2581 Restless legs syndrome: Secondary | ICD-10-CM

## 2017-08-22 DIAGNOSIS — E1165 Type 2 diabetes mellitus with hyperglycemia: Secondary | ICD-10-CM

## 2017-08-22 DIAGNOSIS — R400 Somnolence: Secondary | ICD-10-CM | POA: Diagnosis not present

## 2017-08-22 DIAGNOSIS — I25118 Atherosclerotic heart disease of native coronary artery with other forms of angina pectoris: Secondary | ICD-10-CM

## 2017-08-22 DIAGNOSIS — Z6835 Body mass index (BMI) 35.0-35.9, adult: Secondary | ICD-10-CM

## 2017-08-22 DIAGNOSIS — T50905A Adverse effect of unspecified drugs, medicaments and biological substances, initial encounter: Secondary | ICD-10-CM | POA: Diagnosis not present

## 2017-08-22 LAB — GLUCOSE, POCT (MANUAL RESULT ENTRY): POC Glucose: 267 mg/dl — AB (ref 70–99)

## 2017-08-22 LAB — POCT GLYCOSYLATED HEMOGLOBIN (HGB A1C): HEMOGLOBIN A1C: 6.5

## 2017-08-22 NOTE — Addendum Note (Signed)
Addended by: Caprice Beaver T on: 08/22/2017 09:36 AM   Modules accepted: Orders

## 2017-08-22 NOTE — Assessment & Plan Note (Signed)
>  15 minutes spent in face to face time with patient, >50% spent in counselling or coordination of care. She has already stopped gabapentin and does not want to restart anything else.  She does feel much less sleepy and head is clearer. Note written stating she can return to work and faxed to number provided.

## 2017-08-22 NOTE — Patient Instructions (Addendum)
Please continue: - Metformin ER 1000 mg 2x a day - Glipizide ER 2.5 mg 2x a day before meals  Please decrease the Victoza to 1.2 mg for the next week and then increase to 1.2 mg + 5 clicks and try to build the dose up again to 1.8 mg daily.  Please let me know if the sugars are consistently <80 or >200.  Please return in 3 months with your sugar log.

## 2017-08-22 NOTE — Progress Notes (Signed)
Subjective:   Patient ID: Brenda [name], female    DOB: 1950-12-25, 66 y.o.   MRN: [id number]  Brenda [name] is a pleasant 66 y.o. year old female who presents to clinic today with discuss medications  on 08/22/2017  HPI:  Saw Dr. Cruzita Lederer this morning for her diabetes management- note reviewed. a1c is now 6.5! Lab Results  Component Value Date   HGBA1C 6.5 08/22/2017   RLS- was on gabapentin 600 mg three times daily and stopped it cold [place] when approached by her manager who said she seems "sleepy and off."  Her sleepiness has resolved completely. She needs a note for work saying we have reviewed her medications and she can return to work without restrictions.    Current Outpatient Prescriptions on File Prior to Visit  Medication Sig Dispense Refill  . aspirin 81 MG tablet Take 81 mg by mouth daily.    . benazepril-hydrochlorthiazide (LOTENSIN HCT) 20-12.5 MG tablet Take 1 tablet by mouth daily. 90 tablet 3  . Blood Glucose Monitoring Suppl (FREESTYLE FREEDOM LITE) w/Device KIT Use to check blood sugar twice a day, E11.65 1 each 1  . buPROPion (WELLBUTRIN XL) 300 MG 24 hr tablet Take 1 tablet (300 mg total) by mouth daily. 90 tablet 3  . Calcium-Magnesium-Vitamin D (CALCIUM 500 PO) Take by mouth 2 (two) times daily.    . cholecalciferol (VITAMIN D) 1000 UNITS tablet Take 2,000 Units by mouth daily.     . clonazePAM (KLONOPIN) 0.5 MG tablet TAKE 1 TABLET BY MOUTH 3 TIMES DAILY AS NEEDED FOR ANXIETY 90 tablet 3  . Esomeprazole Magnesium (NEXIUM PO) Take 22.5 mg by mouth daily.    . furosemide (LASIX) 20 MG tablet Take 1 tablet (20 mg total) by mouth daily as needed. 90 tablet 3  . gabapentin (NEURONTIN) 600 MG tablet Take 1 tablet (600 mg total) by mouth 3 (three) times daily. 90 tablet 3  . glipiZIDE (GLUCOTROL XL) 2.5 MG 24 hr tablet Take 1 tablet (2.5 mg total) by mouth 2 (two) times daily before a meal. 180 tablet 3  . glucose blood (FREESTYLE LITE) test strip Use to  check blood sugar twice a day, E11.65 200 each 12  . Lancets (FREESTYLE) lancets Use to check blood sugar twice a day, E11.65 200 each 12  . liraglutide (VICTOZA) 18 MG/3ML SOPN Inject 0.3 mLs (1.8 mg total) into the skin daily. 27 mL 3  . metFORMIN (GLUCOPHAGE-XR) 500 MG 24 hr tablet Take 2 tablets (1,000 mg total) by mouth 2 (two) times daily with a meal. 360 tablet 3  . metoprolol succinate (TOPROL-XL) 25 MG 24 hr tablet TAKE 1 TABLET (25 MG TOTAL) BY MOUTH DAILY. 90 tablet 1  . Multiple Vitamins-Minerals (HM MULTIVITAMIN ADULT GUMMY PO) Take 1 tablet by mouth daily.    . simvastatin (ZOCOR) 40 MG tablet TAKE 1 TABLET BY MOUTH ONCE DAILY AT BEDTIME 90 tablet 3  . UNIFINE PENTIPS 31G X 8 MM MISC USE AS DIRECTED WITH VICTOZA 100 each 88   No current facility-administered medications on file prior to visit.     Allergies  Allergen Reactions  . No Known Allergies     Past Medical History:  Diagnosis Date  . Anxiety   . Arthritis   . Depression    Wellbutrin  . Diabetes mellitus    Type II  . Endometrial ca ([place])    1998  . 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 GERD (gastroesophageal reflux disease)   . Heart murmur    patient states "cardiologist has not heard heart murmur for past several years"  . Heel spur   . Hyperlipidemia   . Hypertension   . Left leg swelling    "swelling in left leg from knee down when sitting for prolonged periods or being on feet for a long period of time"  . PONV (postoperative nausea and vomiting)    after hysterectomy    Past Surgical History:  Procedure Laterality Date  . ABDOMINAL HYSTERECTOMY  1998  . COLONOSCOPY    . EYE SURGERY Bilateral    cataracts  . EYE SURGERY Left    retina tear  . FEMUR IM NAIL  10/25/2012   Procedure: INTRAMEDULLARY (IM) NAIL FEMORAL;  Surgeon: Mauri Pole, MD;  Location: [facility];  Service: Orthopedics;  Laterality: Left;  . HIP SURGERY    . left knee arthroscopy   12/2010  . REFRACTIVE SURGERY    . TOTAL KNEE ARTHROPLASTY  12/20/2011   Procedure: TOTAL KNEE ARTHROPLASTY;  Surgeon: Gearlean Alf, MD;  Location: [facility];  Service: Orthopedics;  Laterality: Left;  Failed attempt at spinal   . TOTAL SHOULDER ARTHROPLASTY Right 08/19/2016   Procedure: RIGHT TOTAL SHOULDER ARTHROPLASTY;  Surgeon: Justice Britain, MD;  Location: [facility];  Service: Orthopedics;  Laterality: Right;    Family History  Problem Relation Age of Onset  . Cancer Mother        breast cancer  . Cancer Father        plasma sarcoma    Social History   Social History  . Marital status: Single    Spouse name: N/A  . Number of children: N/A  . Years of education: N/A   Occupational History  . RN at [employer]   Social History Main Topics  . Smoking status: Never Smoker  . Smokeless tobacco: Never Used  . Alcohol use No  . Drug use: No  . Sexual activity: Not on file   Other Topics Concern  . 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   Review of Systems  All other systems reviewed and are negative.      Objective:    BP 102/62 (BP Location: Left Arm, Patient Position: Sitting, Cuff Size: Normal)   Pulse (!) 101   Temp 98 F (36.7 C) (Oral)   Ht 5' (1.524 m)   Wt 181 lb 1.9 oz (82.2 kg)   SpO2 98%   BMI 35.37 kg/m    Physical Exam  Constitutional: She is oriented to person, place, and time. She appears well-developed and well-nourished. No distress.  HENT:  Head: Normocephalic and atraumatic.  Eyes: Conjunctivae are normal.  Cardiovascular: Normal rate.   Pulmonary/Chest: Effort normal.  Musculoskeletal: Normal range of motion.  Neurological: She is oriented to person, place, and time. No cranial nerve deficit.  Skin: Skin is warm and dry. She is not diaphoretic.    Psychiatric: She has a normal mood and affect. Her behavior is normal. Judgment and thought content normal.  Nursing note and vitals reviewed.         Assessment & Plan:   RLS (restless legs syndrome)  Daytime sleepiness  Medication side effect, initial encounter No Follow-up on file.

## 2017-08-22 NOTE — Progress Notes (Signed)
Patient ID: DEARIA WILMOUTH, female   DOB: 1951/10/21, 66 y.o.   MRN: [id number]  HPI: Brenda [name] is a 66 y.o.-year-old female, returning for follow-up for DM2, dx in 2007, non-insulin-dependent, uncontrolled, with complications (mild CKD). Last visit 3 mo ago.  Last hemoglobin A1c was: Lab Results  Component Value Date   HGBA1C 7.5 05/13/2017   HGBA1C 6.9 01/18/2017   HGBA1C 6.6 10/20/2016   Pt is on a regimen of: - Metformin ER 2000 mg at dinnertime >> 1000 mg 2x a day - Victoza 1.8 mg daily - Glipizide ER 2.5 mg 2x a day before meals - could not crush pill before dinner She was on Glipizide in 11/2015 after a steroid inj. At last visit, we stopped Glipizide 5 mg 2x a day, before meals, because of lows.  Pt checks her sugars 2-3x a day: - am: 104-147, 154, 178 >> 92-160, 172 >> 55, 99-152, 160 (did not take meds yesterday as she felt sick) - 2h after b'fast: 105, 129 >> n/c >> 156 >> n/c - before lunch: 63, 114-121, 148 >> 89 >> 117-131 - 2h after lunch:  64, 81-138 >> n/c >> 98 - before dinner:37 x1, 61-124, 163 >> 66-127, 150, 165 >> 62-142, 157 173 - 2h after dinner: 151 >> 180 >> n/c - bedtime: n/c >> 132-207 >> 74, 80-170 >> 91-143 - nighttime: n/c Lowest sugar was 37 x1 >> 66 >> 55; she has hypoglycemia awareness at 70s.  Highest sugar was  178 >> 175 >> 260 x 1 now.  Glucometer: Freestyle Lite  Pt's meals are: - Breakfast: English muffin + preserve or cereal or yoghurt - snack: fruit or fruit + yoghurt - Lunch: 1/2 sandwich + chips + salsa + fruit/sugar free - Dinner: soup or chicken/steak + veggies, occasional starch or Lean cuisine or light salad  - + mild CKD, last BUN/creatinine:  Lab Results  Component Value Date   BUN 35 (H) 03/07/2017   CREATININE 1.30 (H) 03/07/2017  On Benazepril. - last set of lipids: Lab Results  Component Value Date   CHOL 162 03/07/2017   HDL 33.60 (L) 03/07/2017   LDLCALC 59 07/21/2016   LDLDIRECT 88.0 03/07/2017   TRIG 286.0 (H) 03/07/2017   CHOLHDL 5 03/07/2017  Stopped Simvastatin b/c leg cramps. - last eye exam 03/2017. No DR. Cataract sx in 12/2014. Has a h/o retinal tear >1 year ago. - no numbness and tingling in her feet. On Gabapentin for leg cramps at night. On ASA 81  She also has HTN and HL, GERD, depression.  ROS: Constitutional: + weight loss, no fatigue, no subjective hyperthermia, no subjective hypothermia Eyes: no blurry vision, no xerophthalmia ENT: no sore throat, no nodules palpated in throat, no dysphagia, no odynophagia, no hoarseness Cardiovascular: no CP/no SOB/no palpitations/no leg swelling Respiratory: no cough/no SOB/no wheezing Gastrointestinal: + N/no V/+ D/no C/no acid reflux Musculoskeletal: no muscle aches/no joint aches Skin: no rashes, no hair loss Neurological: no tremors/no numbness/no tingling/no dizziness  I reviewed pt's medications, allergies, PMH, social hx, family hx, and changes were documented in the history of present illness. Otherwise, unchanged from my initial visit note.   Past Medical History:  Diagnosis Date  . Anxiety   . Arthritis   . Depression    Wellbutrin  . Diabetes mellitus    Type II  . Endometrial ca ([place])    1998  . 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  GERD (gastroesophageal reflux disease)   . Heart murmur    patient states "cardiologist has not heard heart murmur for past several years"  . Heel spur   . Hyperlipidemia   . Hypertension   . Left leg swelling    "swelling in left leg from knee down when sitting for prolonged periods or being on feet for a long period of time"  . PONV (postoperative nausea and vomiting)    after hysterectomy   Past Surgical History:  Procedure Laterality Date  . ABDOMINAL HYSTERECTOMY  1998  . COLONOSCOPY    . EYE SURGERY Bilateral    cataracts  . EYE SURGERY Left    retina tear  . FEMUR IM NAIL  10/25/2012   Procedure: INTRAMEDULLARY (IM)  NAIL FEMORAL;  Surgeon: Mauri Pole, MD;  [facility]: [facility];  Service: Orthopedics;  Laterality: Left;  . HIP SURGERY    . left knee arthroscopy  12/2010  . REFRACTIVE SURGERY    . TOTAL KNEE ARTHROPLASTY  12/20/2011   Procedure: TOTAL KNEE ARTHROPLASTY;  Surgeon: Gearlean Alf, MD;  [facility];  Service: Orthopedics;  Laterality: Left;  Failed attempt at spinal   . TOTAL SHOULDER ARTHROPLASTY Right 08/19/2016   Procedure: RIGHT TOTAL SHOULDER ARTHROPLASTY;  Surgeon: Justice Britain, MD;  Location: [place];  Service: Orthopedics;  Laterality: Right;   Social History   Social History  . Marital Status: Single    Spouse Name: N/A  . Number of Children: 0   Occupational History  . 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 Smoking status: Never Smoker   . Smokeless tobacco: Never Used  . Alcohol Use: No  . 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 aspirin 81 MG tablet Take 81 mg by mouth daily.    . benazepril-hydrochlorthiazide (LOTENSIN HCT) 20-12.5 MG tablet Take 1 tablet by mouth daily. 90 tablet 3  . Blood Glucose Monitoring Suppl (FREESTYLE FREEDOM LITE) w/Device KIT Use to check blood sugar twice a day, E11.65 1 each 1  . buPROPion (WELLBUTRIN XL) 300 MG 24 hr tablet Take 1 tablet (300 mg total) by mouth daily. 90 tablet 3  . Calcium-Magnesium-Vitamin D (CALCIUM 500 PO) Take by mouth 2 (two) times daily.    . cholecalciferol (VITAMIN D) 1000 UNITS tablet Take 2,000 Units by mouth daily.     . clonazePAM (KLONOPIN) 0.5 MG tablet TAKE 1 TABLET BY MOUTH 3 TIMES DAILY AS NEEDED FOR ANXIETY 90 tablet 3  . Esomeprazole Magnesium (NEXIUM PO) Take 22.5 mg by mouth daily.    . furosemide (LASIX) 20 MG tablet Take 1 tablet (20 mg total) by mouth daily as needed. 90 tablet 3  . gabapentin (NEURONTIN) 600 MG tablet Take 1 tablet (600 mg total) by mouth 3  (three) times daily. 90 tablet 3  . glipiZIDE (GLUCOTROL XL) 2.5 MG 24 hr tablet Take 1 tablet (2.5 mg total) by mouth 2 (two) times daily before a meal. 180 tablet 3  . glucose blood (FREESTYLE LITE) test strip Use to check blood sugar twice a day, E11.65 200 each 12  . Lancets (FREESTYLE) lancets Use to check blood sugar twice a day, E11.65 200 each 12  . liraglutide (VICTOZA) 18 MG/3ML SOPN Inject 0.3 mLs (1.8 mg total) into the skin daily. 27 mL 3  .  metFORMIN (GLUCOPHAGE-XR) 500 MG 24 hr tablet Take 2 tablets (1,000 mg total) by mouth 2 (two) times daily with a meal. 360 tablet 3  . metoprolol succinate (TOPROL-XL) 25 MG 24 hr tablet TAKE 1 TABLET (25 MG TOTAL) BY MOUTH DAILY. 90 tablet 1  . Multiple Vitamins-Minerals (HM MULTIVITAMIN ADULT GUMMY PO) Take 1 tablet by mouth daily.    . simvastatin (ZOCOR) 40 MG tablet TAKE 1 TABLET BY MOUTH ONCE DAILY AT BEDTIME 90 tablet 3  . UNIFINE PENTIPS 31G X 8 MM MISC USE AS DIRECTED WITH VICTOZA 100 each 88   No current facility-administered medications on file prior to visit.    Allergies  Allergen Reactions  . No Known Allergies    Family History  Problem Relation Age of Onset  . Cancer Mother        breast cancer  . Cancer Father        plasma sarcoma   PE: BP 128/72 (BP Location: Left Arm, Patient Position: Sitting)   Pulse (!) 106   Wt 182 lb (82.6 kg)   SpO2 95%   BMI 35.54 kg/m  Body mass index is 35.54 kg/m. Wt Readings from Last 3 Encounters:  08/22/17 182 lb (82.6 kg)  05/13/17 191 lb (86.6 kg)  04/04/17 196 lb 4 oz (89 kg)   Constitutional: overweight, in NAD Eyes: PERRLA, EOMI, no exophthalmos ENT: moist mucous membranes, no thyromegaly, no cervical lymphadenopathy Cardiovascular: Tachycardia, RR, No MRG Respiratory: CTA B Gastrointestinal: abdomen soft, NT, ND, BS+ Musculoskeletal: no deformities, strength intact in all 4 Skin: moist, warm, no rashes Neurological: no tremor with outstretched hands, DTR normal in  all 4  ASSESSMENT: 1. DM2, non-insulin-dependent, uncontrolled, with complications and hyperglycemia - mild CKD  2. Obesity  PLAN:  1. Patient with long-standing, previously uncontrolled diabetes, then with better control, but worse at last visit. She continues on oral antidiabetic regimen and GLP-1 receptor agonist. At last visit, sugars were higher in the morning as she was eating more rice at night. Sugars before dinner or lower, as she did not have a lot of time to eat at work. Therefore, at last visit, we removed all the metformin to dinnertime to hopefully improve a.m. Sugars. She could not tolerate this >> back to 1000 mg 2x a day. - Since last visit, she lost 9 pounds - and sugars are better - she does have more nausea >> held all meds yesterday >> CBG higher this am (160) and 260 in our office today - Will decrease Victoza > see below - she had 2 lows in am : 55 and 62 >> discussed to let me know if she has more >> we may need to stop Glipizide at dinner - I suggested to:  Patient Instructions  Please continue: - Metformin ER 1000 mg 2x a day - Glipizide ER 2.5 mg 2x a day before meals  Please decrease the Victoza to 1.2 mg for the next week and then increase to 1.2 mg + 5 clicks and try to build the dose up again to 1.8 mg daily.  Please let me know if the sugars are consistently <80 or >200.  Please return in 3 months with your sugar log.    - today, HbA1c is 7%  - continue checking sugars at different times of the day - check 1x a day, rotating checks - advised for yearly eye exams >> she is UTD - had the flu shot this season - Return to clinic in 3  mo with sugar log   2. Obesity - lost 9 lbs since last visit! - will reduce Victoza for now but advised her to increase after nausea improves  Philemon Kingdom, MD PhD [facility]

## 2017-08-24 ENCOUNTER — Ambulatory Visit
Admission: RE | Admit: 2017-08-24 | Discharge: 2017-08-24 | Disposition: A | Discharge status: Home / Self Care | Payer: Medicare Other | Source: Ambulatory Visit | Attending: Family Medicine | Admitting: Family Medicine

## 2017-08-24 DIAGNOSIS — Z1231 Encounter for screening mammogram for malignant neoplasm of breast: Secondary | ICD-10-CM

## 2017-08-30 DIAGNOSIS — H33012 Retinal detachment with single break, left eye: Secondary | ICD-10-CM | POA: Diagnosis not present

## 2017-08-30 DIAGNOSIS — E119 Type 2 diabetes mellitus without complications: Secondary | ICD-10-CM | POA: Diagnosis not present

## 2017-09-12 MED FILL — AMOXICILLIN 500 MG CAPSULE: 500 | 4 days supply | Qty: 16 | Fill #0

## 2017-09-28 MED FILL — clonazePAM 0.5 MG TABS: 0.5 | 30 days supply | Qty: 90 | Fill #3

## 2017-10-19 MED FILL — VICTOZA 18 MG/3 ML INJECT P: 18 | 30 days supply | Qty: 9 | Fill #2

## 2017-10-19 MED FILL — BUPROPION HCL XL 300 MG TAB: 300 | 90 days supply | Qty: 90 | Fill #2

## 2017-10-19 MED FILL — BENAZEPRIL-HCTZ 20-12.5 MG: 20-12.5 | 90 days supply | Qty: 90 | Fill #2

## 2017-11-04 MED FILL — glipiZIDE ER 2.5 MG TB24: 2.5 | 90 days supply | Qty: 180 | Fill #3

## 2017-11-17 ENCOUNTER — Other Ambulatory Visit: Payer: Self-pay

## 2017-11-17 ENCOUNTER — Inpatient Hospital Stay (HOSPITAL_COMMUNITY)
Admission: EM | Admit: 2017-11-17 | Discharge: 2017-11-21 | DRG: 683 | Disposition: A | Discharge status: Home / Self Care | Payer: Medicare Other | Attending: Internal Medicine | Admitting: Internal Medicine

## 2017-11-17 ENCOUNTER — Emergency Department (HOSPITAL_COMMUNITY): Payer: Medicare Other

## 2017-11-17 DIAGNOSIS — R590 Localized enlarged lymph nodes: Secondary | ICD-10-CM | POA: Diagnosis not present

## 2017-11-17 DIAGNOSIS — F419 Anxiety disorder, unspecified: Secondary | ICD-10-CM | POA: Diagnosis present

## 2017-11-17 DIAGNOSIS — C7800 Secondary malignant neoplasm of unspecified lung: Secondary | ICD-10-CM | POA: Diagnosis not present

## 2017-11-17 DIAGNOSIS — E872 Acidosis, unspecified: Secondary | ICD-10-CM | POA: Diagnosis present

## 2017-11-17 DIAGNOSIS — R918 Other nonspecific abnormal finding of lung field: Secondary | ICD-10-CM | POA: Diagnosis not present

## 2017-11-17 DIAGNOSIS — E861 Hypovolemia: Secondary | ICD-10-CM | POA: Diagnosis present

## 2017-11-17 DIAGNOSIS — I25118 Atherosclerotic heart disease of native coronary artery with other forms of angina pectoris: Secondary | ICD-10-CM | POA: Diagnosis not present

## 2017-11-17 DIAGNOSIS — Z8542 Personal history of malignant neoplasm of other parts of uterus: Secondary | ICD-10-CM | POA: Diagnosis not present

## 2017-11-17 DIAGNOSIS — Z888 Allergy status to other drugs, medicaments and biological substances status: Secondary | ICD-10-CM

## 2017-11-17 DIAGNOSIS — K8020 Calculus of gallbladder without cholecystitis without obstruction: Secondary | ICD-10-CM | POA: Diagnosis not present

## 2017-11-17 DIAGNOSIS — F329 Major depressive disorder, single episode, unspecified: Secondary | ICD-10-CM | POA: Diagnosis present

## 2017-11-17 DIAGNOSIS — Z803 Family history of malignant neoplasm of breast: Secondary | ICD-10-CM

## 2017-11-17 DIAGNOSIS — E538 Deficiency of other specified B group vitamins: Secondary | ICD-10-CM | POA: Diagnosis present

## 2017-11-17 DIAGNOSIS — I959 Hypotension, unspecified: Secondary | ICD-10-CM | POA: Diagnosis not present

## 2017-11-17 DIAGNOSIS — D500 Iron deficiency anemia secondary to blood loss (chronic): Secondary | ICD-10-CM | POA: Diagnosis present

## 2017-11-17 DIAGNOSIS — Z808 Family history of malignant neoplasm of other organs or systems: Secondary | ICD-10-CM

## 2017-11-17 DIAGNOSIS — Z7982 Long term (current) use of aspirin: Secondary | ICD-10-CM

## 2017-11-17 DIAGNOSIS — E1165 Type 2 diabetes mellitus with hyperglycemia: Secondary | ICD-10-CM | POA: Diagnosis present

## 2017-11-17 DIAGNOSIS — R1084 Generalized abdominal pain: Secondary | ICD-10-CM | POA: Diagnosis not present

## 2017-11-17 DIAGNOSIS — Z7984 Long term (current) use of oral hypoglycemic drugs: Secondary | ICD-10-CM

## 2017-11-17 DIAGNOSIS — K900 Celiac disease: Secondary | ICD-10-CM | POA: Diagnosis present

## 2017-11-17 DIAGNOSIS — Z96611 Presence of right artificial shoulder joint: Secondary | ICD-10-CM | POA: Diagnosis present

## 2017-11-17 DIAGNOSIS — I129 Hypertensive chronic kidney disease with stage 1 through stage 4 chronic kidney disease, or unspecified chronic kidney disease: Secondary | ICD-10-CM | POA: Diagnosis present

## 2017-11-17 DIAGNOSIS — Z6833 Body mass index (BMI) 33.0-33.9, adult: Secondary | ICD-10-CM

## 2017-11-17 DIAGNOSIS — N179 Acute kidney failure, unspecified: Secondary | ICD-10-CM | POA: Diagnosis present

## 2017-11-17 DIAGNOSIS — I9589 Other hypotension: Secondary | ICD-10-CM | POA: Diagnosis not present

## 2017-11-17 DIAGNOSIS — N189 Chronic kidney disease, unspecified: Secondary | ICD-10-CM | POA: Diagnosis not present

## 2017-11-17 DIAGNOSIS — D509 Iron deficiency anemia, unspecified: Secondary | ICD-10-CM | POA: Diagnosis not present

## 2017-11-17 DIAGNOSIS — I10 Essential (primary) hypertension: Secondary | ICD-10-CM | POA: Diagnosis present

## 2017-11-17 DIAGNOSIS — E1122 Type 2 diabetes mellitus with diabetic chronic kidney disease: Secondary | ICD-10-CM | POA: Diagnosis present

## 2017-11-17 DIAGNOSIS — E860 Dehydration: Secondary | ICD-10-CM | POA: Diagnosis present

## 2017-11-17 DIAGNOSIS — I2510 Atherosclerotic heart disease of native coronary artery without angina pectoris: Secondary | ICD-10-CM | POA: Diagnosis present

## 2017-11-17 DIAGNOSIS — C9110 Chronic lymphocytic leukemia of B-cell type not having achieved remission: Secondary | ICD-10-CM | POA: Diagnosis present

## 2017-11-17 DIAGNOSIS — Z79899 Other long term (current) drug therapy: Secondary | ICD-10-CM

## 2017-11-17 DIAGNOSIS — E785 Hyperlipidemia, unspecified: Secondary | ICD-10-CM | POA: Diagnosis present

## 2017-11-17 DIAGNOSIS — G4700 Insomnia, unspecified: Secondary | ICD-10-CM | POA: Diagnosis present

## 2017-11-17 DIAGNOSIS — D631 Anemia in chronic kidney disease: Secondary | ICD-10-CM | POA: Diagnosis present

## 2017-11-17 DIAGNOSIS — E118 Type 2 diabetes mellitus with unspecified complications: Secondary | ICD-10-CM | POA: Diagnosis present

## 2017-11-17 DIAGNOSIS — R630 Anorexia: Secondary | ICD-10-CM | POA: Diagnosis present

## 2017-11-17 DIAGNOSIS — Z96652 Presence of left artificial knee joint: Secondary | ICD-10-CM | POA: Diagnosis present

## 2017-11-17 DIAGNOSIS — K219 Gastro-esophageal reflux disease without esophagitis: Secondary | ICD-10-CM | POA: Diagnosis present

## 2017-11-17 HISTORY — DX: Disorder of kidney and ureter, unspecified: N28.9

## 2017-11-17 LAB — CBC WITH DIFFERENTIAL/PLATELET
Basophils Absolute: 0.1 10*3/uL (ref 0.0–0.1)
Basophils Relative: 1 %
Eosinophils Absolute: 0.1 10*3/uL (ref 0.0–0.7)
Eosinophils Relative: 1 %
HCT: 28.8 % — ABNORMAL LOW (ref 36.0–46.0)
Hemoglobin: 8.7 g/dL — ABNORMAL LOW (ref 12.0–15.0)
Lymphocytes Relative: 33 %
Lymphs Abs: 3.5 10*3/uL (ref 0.7–4.0)
MCH: 21.8 pg — ABNORMAL LOW (ref 26.0–34.0)
MCHC: 30.2 g/dL (ref 30.0–36.0)
MCV: 72.2 fL — ABNORMAL LOW (ref 78.0–100.0)
Monocytes Absolute: 0.7 10*3/uL (ref 0.1–1.0)
Monocytes Relative: 7 %
Neutro Abs: 6.2 10*3/uL (ref 1.7–7.7)
Neutrophils Relative %: 58 %
Platelets: 299 10*3/uL (ref 150–400)
RBC: 3.99 MIL/uL (ref 3.87–5.11)
RDW: 20.9 % — ABNORMAL HIGH (ref 11.5–15.5)
WBC: 10.6 10*3/uL — ABNORMAL HIGH (ref 4.0–10.5)

## 2017-11-17 LAB — COMPREHENSIVE METABOLIC PANEL
ALT: 9 U/L — ABNORMAL LOW (ref 14–54)
AST: 11 U/L — ABNORMAL LOW (ref 15–41)
Albumin: 3.8 g/dL (ref 3.5–5.0)
Alkaline Phosphatase: 90 U/L (ref 38–126)
Anion gap: 18 — ABNORMAL HIGH (ref 5–15)
BUN: 107 mg/dL — ABNORMAL HIGH (ref 6–20)
CO2: 9 mmol/L — ABNORMAL LOW (ref 22–32)
Calcium: 8 mg/dL — ABNORMAL LOW (ref 8.9–10.3)
Chloride: 116 mmol/L — ABNORMAL HIGH (ref 101–111)
Creatinine, Ser: 4.92 mg/dL — ABNORMAL HIGH (ref 0.44–1.00)
GFR calc Af Amer: 10 mL/min — ABNORMAL LOW (ref >60)
GFR calc non Af Amer: 8 mL/min — ABNORMAL LOW (ref >60)
Glucose, Bld: 165 mg/dL — ABNORMAL HIGH (ref 65–99)
Potassium: 4.5 mmol/L (ref 3.5–5.1)
Sodium: 143 mmol/L (ref 135–145)
Total Bilirubin: 1 mg/dL (ref 0.3–1.2)
Total Protein: 6.8 g/dL (ref 6.5–8.1)

## 2017-11-17 LAB — LIPASE, BLOOD: Lipase: 137 U/L — ABNORMAL HIGH (ref 11–51)

## 2017-11-17 MED ORDER — FENTANYL CITRATE (PF) 100 MCG/2ML IJ SOLN
50.0000 ug | Freq: Once | INTRAMUSCULAR | Status: AC
  Administered 2017-11-17: 50 ug via INTRAVENOUS
  Filled 2017-11-17: qty 2

## 2017-11-17 MED ORDER — METOCLOPRAMIDE HCL 5 MG/ML IJ SOLN
10.0000 mg | Freq: Once | INTRAMUSCULAR | Status: AC
  Administered 2017-11-17: 10 mg via INTRAVENOUS
  Filled 2017-11-17: qty 2

## 2017-11-17 MED ORDER — LACTATED RINGERS IV BOLUS (SEPSIS)
1000.0000 mL | Freq: Once | INTRAVENOUS | Status: AC
  Administered 2017-11-17: 1000 mL via INTRAVENOUS

## 2017-11-17 MED ORDER — MORPHINE SULFATE (PF) 4 MG/ML IV SOLN
4.0000 mg | Freq: Once | INTRAVENOUS | Status: AC
  Administered 2017-11-17: 4 mg via INTRAVENOUS
  Filled 2017-11-17: qty 1

## 2017-11-17 MED ORDER — ONDANSETRON HCL 4 MG/2ML IJ SOLN
4.0000 mg | Freq: Once | INTRAMUSCULAR | Status: AC
  Administered 2017-11-17: 4 mg via INTRAVENOUS
  Filled 2017-11-17: qty 2

## 2017-11-17 NOTE — ED Provider Notes (Signed)
[facility] Provider Note   CSN: [id number] Arrival date & time: 11/17/17  1738     History   Chief Complaint Chief Complaint  Patient presents with  . Abdominal Pain    HPI Brenda [name] is a 67 y.o. female.   Abdominal Pain   This is a new problem. Episode onset: 1 week. The problem occurs constantly. The problem has been gradually worsening. The pain is located in the generalized abdominal region. The quality of the pain is aching and dull. The pain is moderate. Associated symptoms include anorexia, flatus and nausea. Pertinent negatives include fever, vomiting, constipation, dysuria, hematuria and arthralgias. Nothing aggravates the symptoms. Nothing relieves the symptoms.    Past Medical History:  Diagnosis Date  . Anxiety   . Arthritis   . Depression    Wellbutrin  . Diabetes mellitus    Type II  . Endometrial ca ([place])    1998  . 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 GERD (gastroesophageal reflux disease)   . Heart murmur    patient states "cardiologist has not heard heart murmur for past several years"  . Heel spur   . Hyperlipidemia   . Hypertension   . Left leg swelling    "swelling in left leg from knee down when sitting for prolonged periods or being on feet for a long period of time"  . PONV (postoperative nausea and vomiting)    after hysterectomy    Patient Active Problem List   Diagnosis Date Noted  . Daytime sleepiness 08/22/2017  . Medication side effect, initial encounter 08/22/2017  . Coronary artery disease of native artery of native heart with stable angina pectoris ([place]) 04/03/2017  . S/P shoulder replacement, right 08/19/2016  . Type 2 diabetes mellitus with hyperglycemia, without long-term current use of insulin ([place]) 10/15/2015  . Obesity 01/24/2015  . Generalized anxiety disorder 01/02/2015  . RLS (restless legs syndrome) 04/01/2014  . Tachycardia  01/31/2013  . Hip fracture, left ([place]) 10/24/2012  . Postop Hyponatremia 12/21/2011  . OA (osteoarthritis) 09/20/2011  . Mixed hyperlipidemia 11/06/2010  . Depression 11/06/2010  . Essential hypertension 11/06/2010  . GERD 11/06/2010    Past Surgical History:  Procedure Laterality Date  . ABDOMINAL HYSTERECTOMY  1998  . COLONOSCOPY    . EYE SURGERY Bilateral    cataracts  . EYE SURGERY Left    retina tear  . FEMUR IM NAIL  10/25/2012   Procedure: INTRAMEDULLARY (IM) NAIL FEMORAL;  Surgeon: Mauri Pole, MD;  Location: [facility];  Service: Orthopedics;  Laterality: Left;  . HIP SURGERY    . left knee arthroscopy  12/2010  . REFRACTIVE SURGERY    . TOTAL KNEE ARTHROPLASTY  12/20/2011   Procedure: TOTAL KNEE ARTHROPLASTY;  Surgeon: Gearlean Alf, MD;  Location: [facility];  Service: Orthopedics;  Laterality: Left;  Failed attempt at spinal   . TOTAL SHOULDER ARTHROPLASTY Right 08/19/2016   Procedure: RIGHT TOTAL SHOULDER ARTHROPLASTY;  Surgeon: Justice Britain, MD;  Location: [facility];  Service: Orthopedics;  Laterality: Right;    OB History    No data available       Home Medications    Prior to Admission medications   Medication Sig Start Date End Date Taking? Authorizing Provider  aspirin 81 MG tablet Take 81 mg by mouth daily.    [provider]  benazepril-hydrochlorthiazide (LOTENSIN HCT) 20-12.5 MG tablet Take 1 tablet by  mouth daily. 04/11/17   Lucille Passy, MD  Blood Glucose Monitoring Suppl (FREESTYLE FREEDOM LITE) w/Device KIT Use to check blood sugar twice a day, E11.65 10/20/16   Philemon Kingdom, MD  buPROPion (WELLBUTRIN XL) 300 MG 24 hr tablet Take 1 tablet (300 mg total) by mouth daily. 03/07/17   Lucille Passy, MD  Calcium-Magnesium-Vitamin D (CALCIUM 500 PO) Take by mouth 2 (two) times daily.    [provider]  cholecalciferol (VITAMIN D) 1000 UNITS tablet Take 2,000 Units by mouth daily.     [provider]  clonazePAM (KLONOPIN) 0.5 MG  tablet TAKE 1 TABLET BY MOUTH 3 TIMES DAILY AS NEEDED FOR ANXIETY 04/28/17   Lucille Passy, MD  Esomeprazole Magnesium (NEXIUM PO) Take 22.5 mg by mouth daily.    [provider]  furosemide (LASIX) 20 MG tablet Take 1 tablet (20 mg total) by mouth daily as needed. 04/04/17   Minna Merritts, MD  gabapentin (NEURONTIN) 600 MG tablet Take 1 tablet (600 mg total) by mouth 3 (three) times daily. 03/16/17   Lucille Passy, MD  glipiZIDE (GLUCOTROL XL) 2.5 MG 24 hr tablet Take 1 tablet (2.5 mg total) by mouth 2 (two) times daily before a meal. 02/02/17   Philemon Kingdom, MD  glucose blood (FREESTYLE LITE) test strip Use to check blood sugar twice a day, E11.65 05/13/17   Philemon Kingdom, MD  Lancets (FREESTYLE) lancets Use to check blood sugar twice a day, E11.65 05/13/17   Philemon Kingdom, MD  liraglutide (VICTOZA) 18 MG/3ML SOPN Inject 0.3 mLs (1.8 mg total) into the skin daily. 08/17/17   Philemon Kingdom, MD  metFORMIN (GLUCOPHAGE-XR) 500 MG 24 hr tablet Take 2 tablets (1,000 mg total) by mouth 2 (two) times daily with a meal. 10/20/16   Philemon Kingdom, MD  metoprolol succinate (TOPROL-XL) 25 MG 24 hr tablet TAKE 1 TABLET (25 MG TOTAL) BY MOUTH DAILY. 08/10/17   Minna Merritts, MD  Multiple Vitamins-Minerals (HM MULTIVITAMIN ADULT GUMMY PO) Take 1 tablet by mouth daily.    [provider]  simvastatin (ZOCOR) 40 MG tablet TAKE 1 TABLET BY MOUTH ONCE DAILY AT BEDTIME 06/13/17   Minna Merritts, MD  UNIFINE PENTIPS 31G X 8 MM MISC USE AS DIRECTED WITH VICTOZA 11/04/16   Philemon Kingdom, MD    Family History Family History  Problem Relation Age of Onset  . Cancer Mother        breast cancer  . Cancer Father        plasma sarcoma  . Breast cancer Neg Hx     Social History Social History   Tobacco Use  . Smoking status: Never Smoker  . Smokeless tobacco: Never Used  Substance Use Topics  . Alcohol use: No  . Drug use: No     Allergies   Gabapentin and No known  allergies   Review of Systems Review of Systems  Constitutional: Negative for chills and fever.  HENT: Positive for sore throat (resolved a couple days ago). Negative for ear pain.   Eyes: Negative for pain and visual disturbance.  Respiratory: Negative for cough and shortness of breath.   Cardiovascular: Negative for chest pain and palpitations.  Gastrointestinal: Positive for abdominal pain, anorexia, flatus and nausea. Negative for constipation and vomiting.  Genitourinary: Negative for dysuria and hematuria.  Musculoskeletal: Negative for arthralgias and back pain.  Skin: Negative for color change and rash.  Neurological: Negative for seizures and syncope.  All other systems  reviewed and are negative.    Physical Exam Updated Vital Signs BP (!) 98/59 (BP Location: Right Arm)   Pulse (!) 107   Temp 98.2 F (36.8 C) (Oral)   Resp 16   SpO2 95%   Physical Exam  Constitutional: She appears well-developed and well-nourished. No distress.  HENT:  Head: Normocephalic and atraumatic.  Eyes: Conjunctivae and EOM are normal.  Neck: Normal range of motion. Neck supple.  Cardiovascular: Regular rhythm.  No murmur heard. Tachycardic  Pulmonary/Chest: Effort normal and breath sounds normal. No respiratory distress.  Abdominal: Soft. There is tenderness (general tenderness). There is no guarding.  Musculoskeletal: She exhibits no edema.  Neurological: She is alert.  Skin: Skin is warm and dry.  Psychiatric: She has a normal mood and affect.  Nursing note and vitals reviewed.    ED Treatments / Results  Labs (all labs ordered are listed, but only abnormal results are displayed) Labs Reviewed  CBC WITH DIFFERENTIAL/PLATELET - Abnormal; Notable for the following components:      Result Value   WBC 10.6 (*)    Hemoglobin 8.7 (*)    HCT 28.8 (*)    MCV 72.2 (*)    MCH 21.8 (*)    RDW 20.9 (*)    All other components within normal limits  COMPREHENSIVE METABOLIC PANEL -  Abnormal; Notable for the following components:   Chloride 116 (*)    CO2 9 (*)    Glucose, Bld 165 (*)    BUN 107 (*)    Creatinine, Ser 4.92 (*)    Calcium 8.0 (*)    AST 11 (*)    ALT 9 (*)    GFR calc non Af Amer 8 (*)    GFR calc Af Amer 10 (*)    Anion gap 18 (*)    All other components within normal limits  LIPASE, BLOOD - Abnormal; Notable for the following components:   Lipase 137 (*)    All other components within normal limits    EKG  EKG Interpretation None       Radiology Ct Abdomen Pelvis Wo Contrast  Result Date: 11/17/2017 CLINICAL DATA:  Right upper quadrant pain. EXAM: CT ABDOMEN AND PELVIS WITHOUT CONTRAST TECHNIQUE: Multidetector CT imaging of the abdomen and pelvis was performed following the standard protocol without IV contrast. COMPARISON:  None. FINDINGS: Lower chest: Innumerable pulmonary nodules in the lung bases, measuring up to 8 mm. Hepatobiliary: No focal liver abnormality. Cholelithiasis. The gallbladder is mildly distended. No gallbladder wall thickening or biliary dilatation. Pancreas: Unremarkable. No pancreatic ductal dilatation or surrounding inflammatory changes. Spleen: Normal in size without focal abnormality. Adrenals/Urinary Tract: The adrenal glands are unremarkable. Mild bilateral perinephric fat stranding, nonspecific. No focal renal lesion. No renal or ureteral calculi. No hydronephrosis. The bladder is unremarkable. Stomach/Bowel: Stomach is within normal limits. Appendix appears normal. No evidence of bowel wall thickening, distention, or inflammatory changes. Vascular/Lymphatic: Aortic atherosclerosis. Prominent, borderline enlarged retroperitoneal lymph nodes measuring up to 9 mm in short axis. Enlarged bilateral pelvic sidewall lymph nodes, measuring up to 2.2 cm in short axis. Enlarged right external iliac lymph node measuring up to 1.7 cm in short axis. Reproductive: Status post hysterectomy. No adnexal masses. Other: No free fluid or  pneumoperitoneum. Musculoskeletal: No acute or significant osseous findings. Severe degenerative changes of the thoracolumbar spine. Grade 1 anterolisthesis at L4-L5. IMPRESSION: 1. Pelvic lymphadenopathy and innumerable pulmonary nodules in the lung bases, consistent with metastatic disease. 2. Cholelithiasis without CT evidence of  acute cholecystitis. 3.  Aortic atherosclerosis (ICD10-I70.0). Electronically Signed   By: Titus Dubin M.D.   On: 11/17/2017 20:41    Procedures Procedures (including critical care time)  Medications Ordered in ED Medications  fentaNYL (SUBLIMAZE) injection 50 mcg (50 mcg Intravenous Given 11/17/17 1831)  ondansetron (ZOFRAN) injection 4 mg (4 mg Intravenous Given 11/17/17 1830)     Initial Impression / Assessment and Plan / ED Course  I have reviewed the triage vital signs and the nursing notes.  Pertinent labs & imaging results that were available during my care of the patient were reviewed by me and considered in my medical decision making (see chart for details).     Ms. Lashway is a 67 year old female with a past medical history significant for endometrial cancer status post hysterectomy, depression, diabetes, hypertension, hyperlipidemia who presents for abdominal pain, nausea, and anorexia.  Labs significant for mild leukocytosis, anemia, atypical lymphocytes, acute kidney injury, metabolic acidosis.  CT abdomen/pelvis obtained demonstrates pelvic lymphadenopathy and pulmonary nodules concerning for metastatic disease.  The patient is given IV fluids, fentanyl for pain control, and Zofran for nausea.  The patient is admitted for further treatment and work up.  Final Clinical Impressions(s) / ED Diagnoses   Final diagnoses:  Generalized abdominal pain  Acute kidney injury [facility])    ED Discharge Orders    None       Elveria Rising, MD 11/17/17 2174    Margette Fast, MD 11/18/17 [phone]

## 2017-11-17 NOTE — ED Provider Notes (Signed)
Patient placed in Quick Look pathway, seen and evaluated   Chief Complaint: Abdominal pain  HPI:   67 year old female presents today with complaints of abdominal pain.  Patient reports approximately 2 days ago she started to feel ill and loss of appetite.  She notes she has since had epigastric and right upper quadrant abdominal pain after eating, reports some nausea denies any vomiting, has had normal bowel movements.  She denies any fever at home.  ROS:  (one)  Physical Exam:   Gen: No distress  Neuro: Awake and Alert  Skin: Warm    Focused Exam: Abdomen exquisite tenderness to the epigastric and right upper quadrant  Initiation of care has begun. The patient has been counseled on the process, plan, and necessity for staying for the completion/evaluation, and the remainder of the medical screening examination   Brenda [name] 11/17/17 1805    Noemi Chapel, MD 11/18/17 [phone]

## 2017-11-17 NOTE — ED Triage Notes (Signed)
Pt reports she has had decreased appetite since Sunday. She reports every time she eats she has abdominal pain. Pt also reports some nausea.

## 2017-11-18 ENCOUNTER — Encounter (HOSPITAL_COMMUNITY): Payer: Self-pay | Admitting: Family Medicine

## 2017-11-18 DIAGNOSIS — E872 Acidosis, unspecified: Secondary | ICD-10-CM | POA: Diagnosis present

## 2017-11-18 DIAGNOSIS — D509 Iron deficiency anemia, unspecified: Secondary | ICD-10-CM | POA: Diagnosis present

## 2017-11-18 DIAGNOSIS — Z8542 Personal history of malignant neoplasm of other parts of uterus: Secondary | ICD-10-CM

## 2017-11-18 DIAGNOSIS — C7800 Secondary malignant neoplasm of unspecified lung: Secondary | ICD-10-CM

## 2017-11-18 DIAGNOSIS — N179 Acute kidney failure, unspecified: Principal | ICD-10-CM

## 2017-11-18 DIAGNOSIS — I959 Hypotension, unspecified: Secondary | ICD-10-CM | POA: Diagnosis present

## 2017-11-18 DIAGNOSIS — R590 Localized enlarged lymph nodes: Secondary | ICD-10-CM | POA: Diagnosis present

## 2017-11-18 DIAGNOSIS — R1084 Generalized abdominal pain: Secondary | ICD-10-CM

## 2017-11-18 DIAGNOSIS — E1165 Type 2 diabetes mellitus with hyperglycemia: Secondary | ICD-10-CM

## 2017-11-18 LAB — BASIC METABOLIC PANEL
Anion gap: 13 (ref 5–15)
BUN: 98 mg/dL — AB (ref 6–20)
CHLORIDE: 119 mmol/L — AB (ref 101–111)
CO2: 14 mmol/L — ABNORMAL LOW (ref 22–32)
Calcium: 7.8 mg/dL — ABNORMAL LOW (ref 8.9–10.3)
Creatinine, Ser: 3.92 mg/dL — ABNORMAL HIGH (ref 0.44–1.00)
GFR calc Af Amer: 13 mL/min — ABNORMAL LOW (ref >60)
GFR, EST NON AFRICAN AMERICAN: 11 mL/min — AB (ref >60)
GLUCOSE: 120 mg/dL — AB (ref 65–99)
POTASSIUM: 4.3 mmol/L (ref 3.5–5.1)
Sodium: 146 mmol/L — ABNORMAL HIGH (ref 135–145)

## 2017-11-18 LAB — RETICULOCYTES
RBC.: 2.94 MIL/uL — AB (ref 3.87–5.11)
RETIC COUNT ABSOLUTE: 26.5 10*3/uL (ref 19.0–186.0)
RETIC CT PCT: 0.9 % (ref 0.4–3.1)

## 2017-11-18 LAB — FOLATE: Folate: 12.8 ng/mL (ref >5.9)

## 2017-11-18 LAB — GLUCOSE, CAPILLARY
GLUCOSE-CAPILLARY: 143 mg/dL — AB (ref 65–99)
GLUCOSE-CAPILLARY: 120 mg/dL — AB (ref 65–99)
GLUCOSE-CAPILLARY: 108 mg/dL — AB (ref 65–99)
Glucose-Capillary: 117 mg/dL — ABNORMAL HIGH (ref 65–99)
Glucose-Capillary: 116 mg/dL — ABNORMAL HIGH (ref 65–99)

## 2017-11-18 LAB — CBC WITH DIFFERENTIAL/PLATELET
Basophils Absolute: 0.1 10*3/uL (ref 0.0–0.1)
Basophils Relative: 1 %
EOS PCT: 1 %
Eosinophils Absolute: 0.1 10*3/uL (ref 0.0–0.7)
HCT: 21.2 % — ABNORMAL LOW (ref 36.0–46.0)
Hemoglobin: 6.6 g/dL — CL (ref 12.0–15.0)
LYMPHS ABS: 2.2 10*3/uL (ref 0.7–4.0)
Lymphocytes Relative: 35 %
MCH: 22.4 pg — ABNORMAL LOW (ref 26.0–34.0)
MCHC: 31.1 g/dL (ref 30.0–36.0)
MCV: 72.1 fL — AB (ref 78.0–100.0)
Monocytes Absolute: 0.5 10*3/uL (ref 0.1–1.0)
Monocytes Relative: 8 %
NEUTROS ABS: 3.5 10*3/uL (ref 1.7–7.7)
Neutrophils Relative %: 55 %
Platelets: 217 10*3/uL (ref 150–400)
RBC: 2.94 MIL/uL — ABNORMAL LOW (ref 3.87–5.11)
RDW: 21.3 % — AB (ref 11.5–15.5)
WBC: 6.4 10*3/uL (ref 4.0–10.5)

## 2017-11-18 LAB — SODIUM, URINE, RANDOM: SODIUM UR: 70 mmol/L

## 2017-11-18 LAB — FERRITIN: Ferritin: 23 ng/mL (ref 11–307)

## 2017-11-18 LAB — IRON AND TIBC
Iron: 29 ug/dL (ref 28–170)
Saturation Ratios: 8 % — ABNORMAL LOW (ref 10.4–31.8)
TIBC: 370 ug/dL (ref 250–450)
UIBC: 341 ug/dL

## 2017-11-18 LAB — PREPARE RBC (CROSSMATCH)

## 2017-11-18 LAB — MRSA PCR SCREENING: MRSA by PCR: NEGATIVE

## 2017-11-18 LAB — ABO/RH: ABO/RH(D): O POS

## 2017-11-18 LAB — VITAMIN B12: Vitamin B-12: 217 pg/mL (ref 180–914)

## 2017-11-18 MED ORDER — STERILE WATER FOR INJECTION IV SOLN
INTRAVENOUS | Status: DC
  Filled 2017-11-18: qty 850

## 2017-11-18 MED ORDER — SODIUM CHLORIDE 0.9 % IV SOLN: Freq: Once | INTRAVENOUS | Status: DC

## 2017-11-18 MED ORDER — ACETAMINOPHEN 325 MG PO TABS
650.0000 mg | ORAL_TABLET | Freq: Four times a day (QID) | ORAL | Status: DC | PRN
  Administered 2017-11-19: 650 mg via ORAL
  Filled 2017-11-18 (×2): qty 2

## 2017-11-18 MED ORDER — SODIUM CHLORIDE 0.9% FLUSH
3.0000 mL | Freq: Two times a day (BID) | INTRAVENOUS | Status: DC
  Administered 2017-11-18 – 2017-11-21 (×4): 3 mL via INTRAVENOUS

## 2017-11-18 MED ORDER — ONDANSETRON HCL 4 MG PO TABS: 4.0000 mg | ORAL_TABLET | Freq: Four times a day (QID) | ORAL | Status: DC | PRN

## 2017-11-18 MED ORDER — CLONAZEPAM 0.5 MG PO TABS: 0.5000 mg | ORAL_TABLET | Freq: Three times a day (TID) | ORAL | Status: DC | PRN

## 2017-11-18 MED ORDER — ZOLPIDEM TARTRATE 5 MG PO TABS
5.0000 mg | ORAL_TABLET | Freq: Once | ORAL | Status: AC
  Administered 2017-11-18: 5 mg via ORAL
  Filled 2017-11-18: qty 1

## 2017-11-18 MED ORDER — STERILE WATER FOR INJECTION IV SOLN
INTRAVENOUS | Status: DC
  Administered 2017-11-18: 02:00:00 via INTRAVENOUS
  Filled 2017-11-18 (×2): qty 850

## 2017-11-18 MED ORDER — SENNOSIDES-DOCUSATE SODIUM 8.6-50 MG PO TABS: 1.0000 | ORAL_TABLET | Freq: Every evening | ORAL | Status: DC | PRN

## 2017-11-18 MED ORDER — PANTOPRAZOLE SODIUM 40 MG PO TBEC
40.0000 mg | DELAYED_RELEASE_TABLET | Freq: Every day | ORAL | Status: DC
  Administered 2017-11-18 – 2017-11-21 (×4): 40 mg via ORAL
  Filled 2017-11-18 (×4): qty 1

## 2017-11-18 MED ORDER — ACETAMINOPHEN 650 MG RE SUPP: 650.0000 mg | Freq: Four times a day (QID) | RECTAL | Status: DC | PRN

## 2017-11-18 MED ORDER — CYANOCOBALAMIN 1000 MCG/ML IJ SOLN
1000.0000 ug | Freq: Once | INTRAMUSCULAR | Status: AC
  Administered 2017-11-18: 1000 ug via SUBCUTANEOUS
  Filled 2017-11-18: qty 1

## 2017-11-18 MED ORDER — INSULIN ASPART 100 UNIT/ML ~~LOC~~ SOLN: 0.0000 [IU] | Freq: Every day | SUBCUTANEOUS | Status: DC

## 2017-11-18 MED ORDER — MORPHINE SULFATE (PF) 4 MG/ML IV SOLN
1.0000 mg | INTRAVENOUS | Status: DC | PRN
  Administered 2017-11-19 – 2017-11-21 (×2): 2 mg via INTRAVENOUS
  Filled 2017-11-18 (×2): qty 1

## 2017-11-18 MED ORDER — MORPHINE SULFATE (PF) 4 MG/ML IV SOLN: 1.0000 mg | INTRAVENOUS | Status: DC | PRN

## 2017-11-18 MED ORDER — BISACODYL 5 MG PO TBEC: 5.0000 mg | DELAYED_RELEASE_TABLET | Freq: Every day | ORAL | Status: DC | PRN

## 2017-11-18 MED ORDER — SODIUM CHLORIDE 0.45 % IV SOLN: INTRAVENOUS | Status: DC

## 2017-11-18 MED ORDER — HEPARIN SODIUM (PORCINE) 5000 UNIT/ML IJ SOLN
5000.0000 [IU] | Freq: Three times a day (TID) | INTRAMUSCULAR | Status: DC
  Administered 2017-11-18 – 2017-11-21 (×9): 5000 [IU] via SUBCUTANEOUS
  Filled 2017-11-18 (×9): qty 1

## 2017-11-18 MED ORDER — ONDANSETRON HCL 4 MG/2ML IJ SOLN
4.0000 mg | Freq: Four times a day (QID) | INTRAMUSCULAR | Status: DC | PRN
  Administered 2017-11-21: 4 mg via INTRAVENOUS
  Filled 2017-11-18: qty 2

## 2017-11-18 MED ORDER — STERILE WATER FOR INJECTION IV SOLN
INTRAVENOUS | Status: AC
  Administered 2017-11-18 – 2017-11-19 (×2): via INTRAVENOUS
  Filled 2017-11-18 (×4): qty 925

## 2017-11-18 MED ORDER — SIMVASTATIN 40 MG PO TABS
40.0000 mg | ORAL_TABLET | Freq: Every day | ORAL | Status: DC
  Administered 2017-11-18 – 2017-11-20 (×3): 40 mg via ORAL
  Filled 2017-11-18 (×3): qty 1

## 2017-11-18 MED ORDER — VITAMIN D 1000 UNITS PO TABS
2000.0000 [IU] | ORAL_TABLET | Freq: Every day | ORAL | Status: DC
  Administered 2017-11-18 – 2017-11-21 (×4): 2000 [IU] via ORAL
  Filled 2017-11-18 (×4): qty 2

## 2017-11-18 MED ORDER — SODIUM CHLORIDE 0.9 % IV BOLUS (SEPSIS)
1000.0000 mL | Freq: Once | INTRAVENOUS | Status: AC
  Administered 2017-11-18: 1000 mL via INTRAVENOUS

## 2017-11-18 MED ORDER — ASPIRIN EC 81 MG PO TBEC
81.0000 mg | DELAYED_RELEASE_TABLET | Freq: Every day | ORAL | Status: DC
  Administered 2017-11-18 – 2017-11-21 (×4): 81 mg via ORAL
  Filled 2017-11-18 (×4): qty 1

## 2017-11-18 MED ORDER — INSULIN ASPART 100 UNIT/ML ~~LOC~~ SOLN
0.0000 [IU] | Freq: Three times a day (TID) | SUBCUTANEOUS | Status: DC
  Administered 2017-11-18: 1 [IU] via SUBCUTANEOUS
  Administered 2017-11-20: 2 [IU] via SUBCUTANEOUS
  Administered 2017-11-21: 1 [IU] via SUBCUTANEOUS
  Administered 2017-11-21: 2 [IU] via SUBCUTANEOUS

## 2017-11-18 MED ORDER — SODIUM CHLORIDE 0.9 % IV SOLN: INTRAVENOUS | Status: DC

## 2017-11-18 MED ORDER — BUPROPION HCL ER (XL) 150 MG PO TB24
300.0000 mg | ORAL_TABLET | Freq: Every day | ORAL | Status: DC
  Administered 2017-11-18 – 2017-11-21 (×4): 300 mg via ORAL
  Filled 2017-11-18 (×2): qty 2
  Filled 2017-11-18 (×2): qty 1

## 2017-11-18 NOTE — H&P (Signed)
History and Physical    Brenda [name] ZOX:[id number] DOB: [birth year] DOA: 11/17/2017  PCP: Lucille Passy, MD   Patient coming from: Home  Chief Complaint: Abdominal discomfort, malaise, nausea   HPI: Brenda [name] is a 67 y.o. female with medical history significant for depression, anxiety, hypertension, endometrial cancer, and type 2 diabetes mellitus, now presenting to the emergency department for evaluation of abdominal discomfort nausea and anorexia.  Patient reports that she had been experiencing an unintentional's but was otherwise well until approximately 4 days ago when she developed abdominal discomfort with nausea and loss of appetite.  She denies fevers or chills but reports a general malaise and lethargy.  She has not been vomiting and there is no diarrhea.  ED Course: Upon arrival to the ED, patient is found to be afebrile, saturating well on room air, tachycardic in the 110s, and with blood pressure in the 81X systolic.  Chemistry panel reveals a BUN of 107 and creatinine of 4.92, up from 1.30 last May.  CBC is notable for a microcytic anemia with hemoglobin of 8.7, down from 11.7 a year ago, and MCV of 72.2.  CT of the abdomen and pelvis demonstrates pelvic lymphadenopathy, and unfortunately is also notable for innumerable pulmonary nodules in the bases concerning for metastases.  Patient was given a liter lactated Ringer's, fentanyl, and Zofran in the ED.  Blood pressure improved, but remains on the low side and an additional 1 L bolus is now infusing.  She will be admitted to the stepdown unit for ongoing evaluation and management of acute renal failure, likely prerenal in the setting of hypovolemia and hypotension.   Review of Systems:  All other systems reviewed and apart from HPI, are negative.  Past Medical History:  Diagnosis Date  . Anxiety   . Arthritis   . Depression    Wellbutrin  . Diabetes mellitus    Type II  . Endometrial ca ([place])    1998  . 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 GERD (gastroesophageal reflux disease)   . Heart murmur    patient states "cardiologist has not heard heart murmur for past several years"  . Heel spur   . Hyperlipidemia   . Hypertension   . Left leg swelling    "swelling in left leg from knee down when sitting for prolonged periods or being on feet for a long period of time"  . PONV (postoperative nausea and vomiting)    after hysterectomy    Past Surgical History:  Procedure Laterality Date  . ABDOMINAL HYSTERECTOMY  1998  . COLONOSCOPY    . EYE SURGERY Bilateral    cataracts  . EYE SURGERY Left    retina tear  . FEMUR IM NAIL  10/25/2012   Procedure: INTRAMEDULLARY (IM) NAIL FEMORAL;  Surgeon: Mauri Pole, MD;  Location: [facility];  Service: Orthopedics;  Laterality: Left;  . HIP SURGERY    . left knee arthroscopy  12/2010  . REFRACTIVE SURGERY    . TOTAL KNEE ARTHROPLASTY  12/20/2011   Procedure: TOTAL KNEE ARTHROPLASTY;  Surgeon: Gearlean Alf, MD;  Location: [facility];  Service: Orthopedics;  Laterality: Left;  Failed attempt at spinal   . TOTAL SHOULDER ARTHROPLASTY Right 08/19/2016   Procedure: RIGHT TOTAL SHOULDER ARTHROPLASTY;  Surgeon: Justice Britain, MD;  Location: [facility];  Service: Orthopedics;  Laterality: Right;     reports that  has never smoked. she has  never used smokeless tobacco. She reports that she does not drink alcohol or use drugs.  Allergies  Allergen Reactions  . Gabapentin Other (See Comments)    confusion    Family History  Problem Relation Age of Onset  . Cancer Mother        breast cancer  . Cancer Father        plasma sarcoma  . Breast cancer Neg Hx      Prior to Admission medications   Medication Sig Start Date End Date Taking? Authorizing Provider  aspirin 81 MG tablet Take 81 mg by mouth daily.   Yes [provider]  benazepril-hydrochlorthiazide (LOTENSIN HCT) 20-12.5 MG tablet Take 1 tablet by mouth  daily. 04/11/17  Yes Lucille Passy, MD  buPROPion (WELLBUTRIN XL) 300 MG 24 hr tablet Take 1 tablet (300 mg total) by mouth daily. 03/07/17  Yes Lucille Passy, MD  Calcium-Magnesium-Vitamin D (CALCIUM 500 PO) Take by mouth 2 (two) times daily.   Yes [provider]  cholecalciferol (VITAMIN D) 1000 UNITS tablet Take 2,000 Units by mouth daily.    Yes [provider]  clonazePAM (KLONOPIN) 0.5 MG tablet TAKE 1 TABLET BY MOUTH 3 TIMES DAILY AS NEEDED FOR ANXIETY 04/28/17  Yes Lucille Passy, MD  Esomeprazole Magnesium (NEXIUM PO) Take 22.5 mg by mouth daily.   Yes [provider]  furosemide (LASIX) 20 MG tablet Take 1 tablet (20 mg total) by mouth daily as needed. 04/04/17  Yes Minna Merritts, MD  glipiZIDE (GLUCOTROL XL) 2.5 MG 24 hr tablet Take 1 tablet (2.5 mg total) by mouth 2 (two) times daily before a meal. 02/02/17  Yes Philemon Kingdom, MD  liraglutide (VICTOZA) 18 MG/3ML SOPN Inject 0.3 mLs (1.8 mg total) into the skin daily. 08/17/17  Yes Philemon Kingdom, MD  metFORMIN (GLUCOPHAGE-XR) 500 MG 24 hr tablet Take 2 tablets (1,000 mg total) by mouth 2 (two) times daily with a meal. 10/20/16  Yes Philemon Kingdom, MD  metoprolol succinate (TOPROL-XL) 25 MG 24 hr tablet TAKE 1 TABLET (25 MG TOTAL) BY MOUTH DAILY. 08/10/17  Yes Gollan, Kathlene November, MD  Multiple Vitamins-Minerals (HM MULTIVITAMIN ADULT GUMMY PO) Take 1 tablet by mouth daily.   Yes [provider]  simvastatin (ZOCOR) 40 MG tablet TAKE 1 TABLET BY MOUTH ONCE DAILY AT BEDTIME 06/13/17  Yes Gollan, Kathlene November, MD  Blood Glucose Monitoring Suppl (FREESTYLE FREEDOM LITE) w/Device KIT Use to check blood sugar twice a day, E11.65 10/20/16   Philemon Kingdom, MD  glucose blood (FREESTYLE LITE) test strip Use to check blood sugar twice a day, E11.65 05/13/17   Philemon Kingdom, MD  Lancets (FREESTYLE) lancets Use to check blood sugar twice a day, E11.65 05/13/17   Philemon Kingdom, MD  UNIFINE PENTIPS 31G X 8 MM  MISC USE AS DIRECTED WITH VICTOZA 11/04/16   Philemon Kingdom, MD    Physical Exam: Vitals:   11/17/17 2215 11/17/17 2245 11/17/17 2300 11/18/17 0030  BP: (!) 99/51 (!) 110/49 (!) 105/31 (!) 97/56  Pulse: (!) 104 99  96  Resp: _0 Temp:      TempSrc:      SpO2: 99% 99%  97%      Constitutional: NAD, calm, lethargic Eyes: PERTLA, lids and conjunctivae normal ENMT: Mucous membranes are dry. Posterior pharynx clear of any exudate or lesions.   Neck: normal, supple, no masses, no thyromegaly Respiratory: clear to auscultation bilaterally, no wheezing, no crackles. Normal respiratory  effort.   Cardiovascular: S1 & S2 heard, regular rate and rhythm. No extremity edema. No significant JVD. Abdomen: No distension, soft, mild generalized tenderness without rebound pain or guarding. Bowel sounds active.  Musculoskeletal: no clubbing / cyanosis. No joint deformity upper and lower extremities.   Skin: no significant rashes, lesions, ulcers. Poor turgor. Neurologic: CN 2-12 grossly intact. Sensation intact. Strength 5/5 in all 4 limbs.  Psychiatric: Alert and oriented x 3. Calm, cooperative.     Labs on Admission: I have personally reviewed following labs and imaging studies  CBC: Recent Labs  Lab 11/17/17 1816  WBC 10.6*  NEUTROABS 6.2  HGB 8.7*  HCT 28.8*  MCV 72.2*  PLT 364   Basic Metabolic Panel: Recent Labs  Lab 11/17/17 1816  NA 143  K 4.5  CL 116*  CO2 9*  GLUCOSE 165*  BUN 107*  CREATININE 4.92*  CALCIUM 8.0*   GFR: CrCl cannot be calculated (Unknown ideal weight.). Liver Function Tests: Recent Labs  Lab 11/17/17 1816  AST 11*  ALT 9*  ALKPHOS 90  BILITOT 1.0  PROT 6.8  ALBUMIN 3.8   Recent Labs  Lab 11/17/17 1816  LIPASE 137*   No results for input(s): AMMONIA in the last 168 hours. Coagulation Profile: No results for input(s): INR, PROTIME in the last 168 hours. Cardiac Enzymes: No results for input(s): CKTOTAL, CKMB, CKMBINDEX,  TROPONINI in the last 168 hours. BNP (last 3 results) No results for input(s): PROBNP in the last 8760 hours. HbA1C: No results for input(s): HGBA1C in the last 72 hours. CBG: No results for input(s): GLUCAP in the last 168 hours. Lipid Profile: No results for input(s): CHOL, HDL, LDLCALC, TRIG, CHOLHDL, LDLDIRECT in the last 72 hours. Thyroid Function Tests: No results for input(s): TSH, T4TOTAL, FREET4, T3FREE, THYROIDAB in the last 72 hours. Anemia Panel: No results for input(s): VITAMINB12, FOLATE, FERRITIN, TIBC, IRON, RETICCTPCT in the last 72 hours. Urine analysis:    Component Value Date/Time   COLORURINE YELLOW 10/24/2012 2009   APPEARANCEUR CLEAR 10/24/2012 2009   LABSPEC 1.027 10/24/2012 2009   PHURINE 5.0 10/24/2012 2009   GLUCOSEU NEGATIVE 10/24/2012 2009   HGBUR NEGATIVE 10/24/2012 2009   BILIRUBINUR NEGATIVE 10/24/2012 2009   KETONESUR NEGATIVE 10/24/2012 2009   PROTEINUR NEGATIVE 10/24/2012 2009   UROBILINOGEN 0.2 10/24/2012 2009   NITRITE NEGATIVE 10/24/2012 2009   LEUKOCYTESUR NEGATIVE 10/24/2012 2009   Sepsis Labs: _0 (procalcitonin:4,lacticidven:4) )No results found for this or any previous visit (from the past 240 hour(s)).   Radiological Exams on Admission: Ct Abdomen Pelvis Wo Contrast  Result Date: 11/17/2017 CLINICAL DATA:  Right upper quadrant pain. EXAM: CT ABDOMEN AND PELVIS WITHOUT CONTRAST TECHNIQUE: Multidetector CT imaging of the abdomen and pelvis was performed following the standard protocol without IV contrast. COMPARISON:  None. FINDINGS: Lower chest: Innumerable pulmonary nodules in the lung bases, measuring up to 8 mm. Hepatobiliary: No focal liver abnormality. Cholelithiasis. The gallbladder is mildly distended. No gallbladder wall thickening or biliary dilatation. Pancreas: Unremarkable. No pancreatic ductal dilatation or surrounding inflammatory changes. Spleen: Normal in size without focal abnormality. Adrenals/Urinary Tract: The  adrenal glands are unremarkable. Mild bilateral perinephric fat stranding, nonspecific. No focal renal lesion. No renal or ureteral calculi. No hydronephrosis. The bladder is unremarkable. Stomach/Bowel: Stomach is within normal limits. Appendix appears normal. No evidence of bowel wall thickening, distention, or inflammatory changes. Vascular/Lymphatic: Aortic atherosclerosis. Prominent, borderline enlarged retroperitoneal lymph nodes measuring up to 9 mm in short axis. Enlarged bilateral pelvic sidewall lymph  nodes, measuring up to 2.2 cm in short axis. Enlarged right external iliac lymph node measuring up to 1.7 cm in short axis. Reproductive: Status post hysterectomy. No adnexal masses. Other: No free fluid or pneumoperitoneum. Musculoskeletal: No acute or significant osseous findings. Severe degenerative changes of the thoracolumbar spine. Grade 1 anterolisthesis at L4-L5. IMPRESSION: 1. Pelvic lymphadenopathy and innumerable pulmonary nodules in the lung bases, consistent with metastatic disease. 2. Cholelithiasis without CT evidence of acute cholecystitis. 3.  Aortic atherosclerosis (ICD10-I70.0). Electronically Signed   By: Titus Dubin M.D.   On: 11/17/2017 20:41    EKG: Not performed.   Assessment/Plan  1. Acute renal failure - Presents with abdominal discomfort, anorexia, malaise - Found to have SCr of 4.92, up from 1.30 last spring   - No hydronephrosis on CT  - Likely prerenal azotemia in setting of anorexia with hypovolemia and hypotension; ATN possible  - There is an associated acidosis with serum bicarbonate of 9 and AG 18  - Check urine studies, continue fluid-resuscitation with NS followed by isotonic bicarbonate infusion, renally-dose medications, avoid nephrotoxins   2. Hypotension; hx of hypertension  - SBP in 60's in ED, improved with IVF  - No infectious s/s and no chest pain  - Likely secondary to recent anorexia and continued use of antihypertensives  - Given a liter of  LR in ED, additional 1 liter NS bolus now, followed by isotonic bicarbonate infusion  - Hold antihypertensives, monitor in SDU, consult with PCCM if fails to maintain adequate pressures following fluid-resuscitation    3. Lung metastases; pelvic adenopathy; hx endometrial cancer  - CT abd/pelvis is concerning for pelvic adenopathy and incidentally noted innumerable pulmonary nodules consistent with metastases  - She has hx of endometrial cancer but had no sign of recurrence   - Will likely need biopsy once stable   4. Microcytic anemia - Hgb is 8.7 on admission with MCV 72.2  - Hgb had been only slightly low with normal MCV on priors labs with most recent 1 yr ago  - Denies bleeding  - Check anemia panel, type and screen, repeat chem panel in am    5. Type II DM  - A1c was 6.5% in October 2018  - Managed at home with Victoza, metformin, and glipizide, held on admission  - Check CBG's and start SSI with Novolog    6. Depression, anxiety - Continue Wellbutrin and Klonopin    DVT prophylaxis: sq heparin Code Status: Full  Family Communication: Discussed with patient Disposition Plan: Admit to SDU Consults called: None Admission status: Inpatient    Vianne Bulls, MD [facility] Pager [phone]  If 7PM-7AM, please contact night-coverage [email or web address] Password [email or web address]  11/18/2017, 1:01 AM

## 2017-11-18 NOTE — Progress Notes (Signed)
[place] TEAM 1 - Stepdown/ICU TEAM  MEIKO IVES  ZOX:[id number] DOB: [birth year] DOA: 11/17/2017 PCP: Lucille Passy, MD    Brief Narrative:  67 y.o. female with a history of depression, anxiety, hypertension, endometrial cancer, and type 2 diabetes mellitus who presented w/ abdominal discomfort nausea and anorexia for 4 days.    In the ED the patient was found to be tachycardic in the 110s with blood pressure in the 81X systolic.  Chemistry panel revealed a BUN of 107 and creatinine of 4.92.  CT of the abdomen and pelvis demonstrated pelvic lymphadenopathy and innumerable pulmonary nodules in the bases concerning for metastases.  Subjective: Pt is seen for a f/u visit.  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    Assessment & Plan:  Acute renal failure  Recent Labs  Lab 11/17/17 1816 11/18/17 0322  CREATININE 4.92* 3.92*   Hypotension  Metastatic CA of unclear primary / lung mets / abdom LAD - Hx of endometrial CA 1998  Microcytic anemia   Recent Labs  Lab 11/17/17 1816 11/18/17 0322  HGB 8.7* 6.6*    DM2  HLD  DVT prophylaxis: SQ heparin  Code Status: FULL CODE Family Communication: no family present at time of exam  Disposition Plan:   Consultants:  none  Procedures: none  Antimicrobials:  none   Objective: Blood pressure (!) 102/57, pulse (!) 103, temperature 98.1 F (36.7 C), temperature source Oral, resp. rate (!) 21, height 5' (1.524 m), weight 76.2 kg (167 lb 15.9 oz), SpO2 99 %.  Intake/Output Summary (Last 24 hours) at 11/18/2017 0822 Last data filed at 11/18/2017 0740 Gross per 24 hour  Intake 2193.75 ml  Output 800 ml  Net 1393.75 ml   Filed Weights   11/18/17 0304  Weight: 76.2 kg (167 lb 15.9 oz)    Examination: Pt was seen for a f/u visit.    CBC: Recent Labs  Lab 11/17/17 1816 11/18/17 0322  WBC 10.6* 6.4  NEUTROABS 6.2 3.5  HGB 8.7* 6.6*  HCT 28.8* 21.2*  MCV 72.2* 72.1*  PLT 299 914   Basic Metabolic  Panel: Recent Labs  Lab 11/17/17 1816 11/18/17 0322  NA 143 146*  K 4.5 4.3  CL 116* 119*  CO2 9* 14*  GLUCOSE 165* 120*  BUN 107* 98*  CREATININE 4.92* 3.92*  CALCIUM 8.0* 7.8*   GFR: Estimated Creatinine Clearance: 12.5 mL/min (A) (by C-G formula based on SCr of 3.92 mg/dL (H)).  Liver Function Tests: Recent Labs  Lab 11/17/17 1816  AST 11*  ALT 9*  ALKPHOS 90  BILITOT 1.0  PROT 6.8  ALBUMIN 3.8   Recent Labs  Lab 11/17/17 1816  LIPASE 137*   No results for input(s): AMMONIA in the last 168 hours.  Coagulation Profile: No results for input(s): INR, PROTIME in the last 168 hours.  Cardiac Enzymes: No results for input(s): CKTOTAL, CKMB, CKMBINDEX, TROPONINI in the last 168 hours.  HbA1C: Hemoglobin A1C  Date/Time Value Ref Range Status  08/22/2017 09:36 AM 6.5  Final  05/13/2017 04:01 PM 7.5  Final   Hgb A1c MFr Bld  Date/Time Value Ref Range Status  07/16/2015 07:49 AM 10.8 (H) 4.6 - 6.5 % Final    Comment:    Glycemic Control Guidelines for People with Diabetes:Non Diabetic:  <6%Goal of Therapy: <7%Additional Action Suggested:  >8%   04/09/2015 07:41 AM 8.6 (H) 4.6 - 6.5 % Final    Comment:    Glycemic Control  Guidelines for People with Diabetes:Non Diabetic:  <6%Goal of Therapy: <7%Additional Action Suggested:  >8%     CBG: Recent Labs  Lab 11/18/17 0405 11/18/17 0757  GLUCAP 117* 143*    Recent Results (from the past 240 hour(s))  MRSA PCR Screening     Status: None   Collection Time: 11/18/17  3:00 AM  Result Value Ref Range Status   MRSA by PCR NEGATIVE NEGATIVE Final    Comment:        The GeneXpert MRSA Assay (FDA approved for NASAL specimens only), is one component of a comprehensive MRSA colonization surveillance program. It is not intended to diagnose MRSA infection nor to guide or monitor treatment for MRSA infections.      Scheduled Meds: . aspirin EC  81 mg Oral Daily  . buPROPion  300 mg Oral Daily  .  cholecalciferol  2,000 Units Oral Daily  . heparin  5,000 Units Subcutaneous Q8H  . insulin aspart  0-5 Units Subcutaneous QHS  . insulin aspart  0-9 Units Subcutaneous TID WC  . pantoprazole  40 mg Oral Daily  . simvastatin  40 mg Oral q1800  . sodium chloride flush  3 mL Intravenous Q12H   Continuous Infusions: . sodium chloride    .  sodium bicarbonate (isotonic) infusion in sterile water 125 mL/hr at 11/18/17 0227     LOS: 1 day   Time spent: No Charge  Cherene Altes, MD [facility] Office  [phone] Pager - Text Page per Amion as per below:  On-Call/Text Page:      [email or web address]      password [email or web address]  If 7PM-7AM, please contact night-coverage [email or web address] Password [email or web address] 11/18/2017, 8:22 AM

## 2017-11-19 DIAGNOSIS — N189 Chronic kidney disease, unspecified: Secondary | ICD-10-CM

## 2017-11-19 DIAGNOSIS — D631 Anemia in chronic kidney disease: Secondary | ICD-10-CM

## 2017-11-19 DIAGNOSIS — R918 Other nonspecific abnormal finding of lung field: Secondary | ICD-10-CM

## 2017-11-19 LAB — COMPREHENSIVE METABOLIC PANEL
ALBUMIN: 3 g/dL — AB (ref 3.5–5.0)
ALT: 8 U/L — ABNORMAL LOW (ref 14–54)
AST: 9 U/L — AB (ref 15–41)
Alkaline Phosphatase: 67 U/L (ref 38–126)
Anion gap: 14 (ref 5–15)
BUN: 52 mg/dL — AB (ref 6–20)
CHLORIDE: 109 mmol/L (ref 101–111)
CO2: 21 mmol/L — ABNORMAL LOW (ref 22–32)
Calcium: 8.5 mg/dL — ABNORMAL LOW (ref 8.9–10.3)
Creatinine, Ser: 1.88 mg/dL — ABNORMAL HIGH (ref 0.44–1.00)
GFR calc Af Amer: 31 mL/min — ABNORMAL LOW (ref >60)
GFR, EST NON AFRICAN AMERICAN: 26 mL/min — AB (ref >60)
GLUCOSE: 115 mg/dL — AB (ref 65–99)
POTASSIUM: 3.5 mmol/L (ref 3.5–5.1)
Sodium: 144 mmol/L (ref 135–145)
Total Bilirubin: 1 mg/dL (ref 0.3–1.2)
Total Protein: 5.1 g/dL — ABNORMAL LOW (ref 6.5–8.1)

## 2017-11-19 LAB — CBC
HEMATOCRIT: 28.5 % — AB (ref 36.0–46.0)
Hemoglobin: 9 g/dL — ABNORMAL LOW (ref 12.0–15.0)
MCH: 23.9 pg — ABNORMAL LOW (ref 26.0–34.0)
MCHC: 31.6 g/dL (ref 30.0–36.0)
MCV: 75.8 fL — ABNORMAL LOW (ref 78.0–100.0)
Platelets: 203 10*3/uL (ref 150–400)
RBC: 3.76 MIL/uL — ABNORMAL LOW (ref 3.87–5.11)
RDW: 21.3 % — AB (ref 11.5–15.5)
WBC: 7.6 10*3/uL (ref 4.0–10.5)

## 2017-11-19 LAB — BPAM RBC
Blood Product Expiration Date: 201902072359
Blood Product Expiration Date: 201902072359
ISSUE DATE / TIME: 201901180902
ISSUE DATE / TIME: 201901181345
Unit Type and Rh: 5100
Unit Type and Rh: 5100

## 2017-11-19 LAB — TYPE AND SCREEN
ABO/RH(D): O POS
Antibody Screen: NEGATIVE
Unit division: 0
Unit division: 0

## 2017-11-19 LAB — GLUCOSE, CAPILLARY
GLUCOSE-CAPILLARY: 114 mg/dL — AB (ref 65–99)
GLUCOSE-CAPILLARY: 128 mg/dL — AB (ref 65–99)
Glucose-Capillary: 102 mg/dL — ABNORMAL HIGH (ref 65–99)

## 2017-11-19 LAB — LACTATE DEHYDROGENASE: LDH: 118 U/L (ref 98–192)

## 2017-11-19 LAB — TSH: TSH: 1.643 u[IU]/mL (ref 0.350–4.500)

## 2017-11-19 LAB — CALCIUM / CREATININE RATIO, URINE
CALCIUM UR: 1.6 mg/dL
CALCIUM/CREAT. RATIO: 33 mg/g{creat} (ref 0–260)
CREATININE, UR: 47.9 mg/dL

## 2017-11-19 LAB — OCCULT BLOOD X 1 CARD TO LAB, STOOL: Fecal Occult Bld: NEGATIVE

## 2017-11-19 LAB — UREA NITROGEN, URINE: Urea Nitrogen, Ur: 510 mg/dL

## 2017-11-19 MED ORDER — SODIUM CHLORIDE 0.9 % IV SOLN
510.0000 mg | Freq: Once | INTRAVENOUS | Status: AC
  Administered 2017-11-19: 510 mg via INTRAVENOUS
  Filled 2017-11-19: qty 17

## 2017-11-19 MED ORDER — ZOLPIDEM TARTRATE 5 MG PO TABS
5.0000 mg | ORAL_TABLET | Freq: Every evening | ORAL | Status: DC | PRN
  Administered 2017-11-20: 5 mg via ORAL
  Filled 2017-11-19 (×2): qty 1

## 2017-11-19 MED ORDER — SENNOSIDES-DOCUSATE SODIUM 8.6-50 MG PO TABS
1.0000 | ORAL_TABLET | Freq: Two times a day (BID) | ORAL | Status: DC
  Administered 2017-11-20: 1 via ORAL
  Filled 2017-11-19: qty 1

## 2017-11-19 MED ORDER — CLONAZEPAM 1 MG PO TABS
1.0000 mg | ORAL_TABLET | Freq: Three times a day (TID) | ORAL | Status: DC | PRN
  Administered 2017-11-19: 1 mg via ORAL
  Filled 2017-11-19 (×2): qty 1

## 2017-11-19 MED ORDER — STERILE WATER FOR INJECTION IV SOLN
INTRAVENOUS | Status: DC
  Administered 2017-11-19 – 2017-11-20 (×2): via INTRAVENOUS
  Filled 2017-11-19 (×3): qty 850

## 2017-11-19 NOTE — Progress Notes (Signed)
[place] TEAM 1 - Stepdown/ICU TEAM  Brenda [name]  SWF:[id number] DOB: 1951-10-09 DOA: 11/17/2017 PCP: Lucille Passy, MD    Brief Narrative:  67 y.o. female with a history of depression, anxiety, hypertension, endometrial cancer, and type 2 diabetes mellitus who presented w/ abdominal discomfort nausea and anorexia for 4 days.    In the ED the patient was found to be tachycardic in the 110s with blood pressure in the 22G systolic.  Chemistry panel revealed a BUN of 107 and creatinine of 4.92.  CT of the abdomen and pelvis demonstrated pelvic lymphadenopathy and innumerable pulmonary nodules in the bases concerning for metastases.  Subjective: Pt has been struggling w/ insomnia and anxiety, understandably.  She denies cp, sob, n/v, or abdom pain.   Assessment & Plan:  Acute renal failure on CKD crt 1.03 Mar 2017 on random check, and 1.05 Aug 2016 - rapidly approaching her baseline w/ simpel volume resuscitation - cont IVF another 24hrs   Recent Labs  Lab 11/17/17 1816 11/18/17 0322 11/19/17 0344  CREATININE 4.92* 3.92* 1.88*   Hypotension Resolved w/ simple volume expansion   Metastatic CA of unclear primary / lung mets / abdom LAD - Hx of endometrial CA 1998 Discussed at great length w/ pt - review of records reveals pulmonary nodules were actually noted incidentally on cardiac CT chest March 2017 - unfort this was not discussed w/ the pt and was not followed up (at time rec was for repeat imaging in 3-6 months) - will obtain dedicated contrasted CT chest 1/20 if crt has further improved w/ continued hydration -last mamo Oct 2018 normal -last colo Jan 2011 normal - was told to repeat in 10 years    Microcytic anemia - Fe deficiency  Hgb holding study thus far s/p 2U PRBC - Fe infusion today - awaiting stool hemoccult - suspect this may be due to CKD, but Fe def also worrisome for occult GIB - last colo was 2011 and normal   Recent Labs  Lab 11/17/17 1816 11/18/17 0322  11/19/17 0344  HGB 8.7* 6.6* 9.0*    Marginal B12 Supplement x1 and recheck in 8-12 weeks   DM2 CBG controlled   HLD  DVT prophylaxis: SQ heparin  Code Status: FULL CODE Family Communication: no family present at time of exam  Disposition Plan: transfer to med bed - cont to hydrate - plan for CT chest to determine if transcutaneous bx target present   Consultants:  none  Procedures: none  Antimicrobials:  none   Objective: Blood pressure (!) 136/91, pulse 88, temperature 98.7 F (37.1 C), temperature source Oral, resp. rate 15, height 5' (1.524 m), weight 78.9 kg (173 lb 15.1 oz), SpO2 97 %.  Intake/Output Summary (Last 24 hours) at 11/19/2017 1057 Last data filed at 11/19/2017 0800 Gross per 24 hour  Intake 2468.33 ml  Output 2400 ml  Net 68.33 ml   Filed Weights   11/18/17 0304 11/19/17 0512  Weight: 76.2 kg (167 lb 15.9 oz) 78.9 kg (173 lb 15.1 oz)    Examination: General: No acute respiratory distress Lungs: Clear to auscultation bilaterally without wheezes or crackles Cardiovascular: Regular rate and rhythm without murmur gallop or rub normal S1 and S2 Abdomen: Nontender, nondistended, soft, bowel sounds positive, no rebound, no ascites, no appreciable mass Extremities: No significant cyanosis, clubbing, or edema bilateral lower extremities   CBC: Recent Labs  Lab 11/17/17 1816 11/18/17 0322 11/19/17 0344  WBC 10.6* 6.4 7.6  NEUTROABS 6.2 3.5  --  HGB 8.7* 6.6* 9.0*  HCT 28.8* 21.2* 28.5*  MCV 72.2* 72.1* 75.8*  PLT 299 217 826   Basic Metabolic Panel: Recent Labs  Lab 11/17/17 1816 11/18/17 0322 11/19/17 0344  NA 143 146* 144  K 4.5 4.3 3.5  CL 116* 119* 109  CO2 9* 14* 21*  GLUCOSE 165* 120* 115*  BUN 107* 98* 52*  CREATININE 4.92* 3.92* 1.88*  CALCIUM 8.0* 7.8* 8.5*   GFR: Estimated Creatinine Clearance: 26.6 mL/min (A) (by C-G formula based on SCr of 1.88 mg/dL (H)).  Liver Function Tests: Recent Labs  Lab 11/17/17 1816  11/19/17 0344  AST 11* 9*  ALT 9* 8*  ALKPHOS 90 67  BILITOT 1.0 1.0  PROT 6.8 5.1*  ALBUMIN 3.8 3.0*   Recent Labs  Lab 11/17/17 1816  LIPASE 137*    HbA1C: Hemoglobin A1C  Date/Time Value Ref Range Status  08/22/2017 09:36 AM 6.5  Final  05/13/2017 04:01 PM 7.5  Final   Hgb A1c MFr Bld  Date/Time Value Ref Range Status  07/16/2015 07:49 AM 10.8 (H) 4.6 - 6.5 % Final    Comment:    Glycemic Control Guidelines for People with Diabetes:Non Diabetic:  <6%Goal of Therapy: <7%Additional Action Suggested:  >8%   04/09/2015 07:41 AM 8.6 (H) 4.6 - 6.5 % Final    Comment:    Glycemic Control Guidelines for People with Diabetes:Non Diabetic:  <6%Goal of Therapy: <7%Additional Action Suggested:  >8%     CBG: Recent Labs  Lab 11/18/17 0757 11/18/17 1234 11/18/17 1632 11/18/17 2143 11/19/17 0755  GLUCAP 143* 120* 116* 108* 114*    Recent Results (from the past 240 hour(s))  MRSA PCR Screening     Status: None   Collection Time: 11/18/17  3:00 AM  Result Value Ref Range Status   MRSA by PCR NEGATIVE NEGATIVE Final    Comment:        The GeneXpert MRSA Assay (FDA approved for NASAL specimens only), is one component of a comprehensive MRSA colonization surveillance program. It is not intended to diagnose MRSA infection nor to guide or monitor treatment for MRSA infections.      Scheduled Meds: . aspirin EC  81 mg Oral Daily  . buPROPion  300 mg Oral Daily  . cholecalciferol  2,000 Units Oral Daily  . heparin  5,000 Units Subcutaneous Q8H  . insulin aspart  0-5 Units Subcutaneous QHS  . insulin aspart  0-9 Units Subcutaneous TID WC  . pantoprazole  40 mg Oral Daily  . simvastatin  40 mg Oral q1800  . sodium chloride flush  3 mL Intravenous Q12H   Continuous Infusions: . sodium chloride    .  sodium bicarbonate (isotonic) infusion in sterile water 75 mL/hr at 11/19/17 0800     LOS: 2 days    Cherene Altes, MD [facility] Office   [phone] Pager - Text Page per Amion as per below:  On-Call/Text Page:      [email or web address]      password [email or web address]  If 7PM-7AM, please contact night-coverage [email or web address] Password [facility] 11/19/2017, 10:57 AM

## 2017-11-20 ENCOUNTER — Inpatient Hospital Stay (HOSPITAL_COMMUNITY): Payer: Medicare Other

## 2017-11-20 ENCOUNTER — Encounter (HOSPITAL_COMMUNITY): Payer: Self-pay | Admitting: Radiology

## 2017-11-20 LAB — COMPREHENSIVE METABOLIC PANEL
ALBUMIN: 2.9 g/dL — AB (ref 3.5–5.0)
ALK PHOS: 65 U/L (ref 38–126)
ALT: 7 U/L — ABNORMAL LOW (ref 14–54)
ANION GAP: 13 (ref 5–15)
AST: 10 U/L — ABNORMAL LOW (ref 15–41)
BUN: 26 mg/dL — ABNORMAL HIGH (ref 6–20)
CALCIUM: 8.8 mg/dL — AB (ref 8.9–10.3)
CO2: 26 mmol/L (ref 22–32)
Chloride: 105 mmol/L (ref 101–111)
Creatinine, Ser: 1.37 mg/dL — ABNORMAL HIGH (ref 0.44–1.00)
GFR calc non Af Amer: 39 mL/min — ABNORMAL LOW (ref >60)
GFR, EST AFRICAN AMERICAN: 45 mL/min — AB (ref >60)
Glucose, Bld: 131 mg/dL — ABNORMAL HIGH (ref 65–99)
POTASSIUM: 3 mmol/L — AB (ref 3.5–5.1)
Sodium: 144 mmol/L (ref 135–145)
Total Bilirubin: 1.3 mg/dL — ABNORMAL HIGH (ref 0.3–1.2)
Total Protein: 5.3 g/dL — ABNORMAL LOW (ref 6.5–8.1)

## 2017-11-20 LAB — CBC
HEMATOCRIT: 30.6 % — AB (ref 36.0–46.0)
HEMOGLOBIN: 9.4 g/dL — AB (ref 12.0–15.0)
MCH: 23.7 pg — ABNORMAL LOW (ref 26.0–34.0)
MCHC: 30.7 g/dL (ref 30.0–36.0)
MCV: 77.1 fL — ABNORMAL LOW (ref 78.0–100.0)
Platelets: 221 10*3/uL (ref 150–400)
RBC: 3.97 MIL/uL (ref 3.87–5.11)
RDW: 20.4 % — ABNORMAL HIGH (ref 11.5–15.5)
WBC: 7.5 10*3/uL (ref 4.0–10.5)

## 2017-11-20 LAB — GLUCOSE, CAPILLARY
GLUCOSE-CAPILLARY: 125 mg/dL — AB (ref 65–99)
Glucose-Capillary: 107 mg/dL — ABNORMAL HIGH (ref 65–99)
Glucose-Capillary: 139 mg/dL — ABNORMAL HIGH (ref 65–99)
Glucose-Capillary: 95 mg/dL (ref 65–99)
Glucose-Capillary: 166 mg/dL — ABNORMAL HIGH (ref 65–99)

## 2017-11-20 MED ORDER — CLONAZEPAM 0.5 MG PO TABS
0.5000 mg | ORAL_TABLET | Freq: Three times a day (TID) | ORAL | Status: DC | PRN
  Administered 2017-11-20: 0.5 mg via ORAL
  Filled 2017-11-20 (×2): qty 1

## 2017-11-20 MED ORDER — IOPAMIDOL (ISOVUE-300) INJECTION 61%
INTRAVENOUS | Status: AC
  Administered 2017-11-20: 50 mL
  Filled 2017-11-20: qty 50

## 2017-11-20 MED ORDER — POTASSIUM CHLORIDE CRYS ER 20 MEQ PO TBCR
40.0000 meq | EXTENDED_RELEASE_TABLET | Freq: Once | ORAL | Status: AC
  Administered 2017-11-20: 40 meq via ORAL
  Filled 2017-11-20: qty 2

## 2017-11-20 MED ORDER — LOPERAMIDE HCL 2 MG PO CAPS
2.0000 mg | ORAL_CAPSULE | ORAL | Status: DC | PRN
  Administered 2017-11-20 (×2): 2 mg via ORAL
  Filled 2017-11-20 (×2): qty 1

## 2017-11-20 NOTE — Progress Notes (Signed)
[facility] TEAM 1 - Stepdown/ICU TEAM  LORELEY SCHWALL  DJT:[id number] DOB: [birth year] DOA: 11/17/2017 PCP: Lucille Passy, MD    Brief Narrative:  67 y.o. female with a history of depression, anxiety, HTN, endometrial cancer, and DM2 who presented w/ abdominal discomfort, nausea, and anorexia for 4 days.    In the ED the patient was found to be tachycardic in the 110s with blood pressure in the 30S systolic.  Chemistry panel revealed a BUN of 107 and creatinine of 4.92.  CT of the abd and pelvis demonstrated pelvic lymphadenopathy and innumerable pulmonary nodules in the bases concerning for metastases.  Subjective: The patient is resting comfortably at the bedside.  She reports very little appetite with very little intake.  She reports some very loose diarrhea.  Further history reveals that this is actually been an intermittent issue for her for approximately 6 months.  She describes a day or 2 of very loose stools worsened by eating which then seemed to spontaneously resolve.  She denies abdominal cramping or subjective fevers.  Assessment & Plan:  Acute renal failure on CKD crt 1.03 Mar 2017 on random check, and 1.05 Aug 2016 - has now returned to her baseline w/ volume expansion -acute component most consistent with simple prerenal azotemia/dehydration  Recent Labs  Lab 11/17/17 1816 11/18/17 0322 11/19/17 0344 11/20/17 0843  CREATININE 4.92* 3.92* 1.88* 1.37*   Hypotension Resolved   Abdom LAD - Hx of endometrial CA 1998 the significance of her abdominal lymphadenopathy is not clear at this time, especially in the setting of what now appears to be unconnected benign pulmonary nodules - this could conceivably be connected to her intermittent loose stools, but may also be indicative of a lymphoproliferative disorder - I will discuss w/ a Hematologist 1/21 and request advice on the best approach to this issue  -last mamo Oct 2018 normal -last colo Jan 2011 normal - was told to  repeat in 10 years    Multiple pulmonary nodules  review of records reveals pulmonary nodules were actually noted incidentally on cardiac CT chest March 2017 - this was not discussed w/ the pt at the time and was not followed up (at time rec was for repeat imaging in 3-6 months) - contrasted CT chest 1/20 noted stable size of mult pulm nodules since March 2017, w/ the appearance most c/w a benign finding per Radiolgoy interpretation - a follow-up noncontrasted CT chest is suggested in 12 months  Intermittent subacute diarrhea/loose stools Etiology not clear - check for occult celiac w/ serologic tests - may require GI eval - no sx to suggest infectious - [place]    Microcytic anemia - Fe deficiency  Hgb holding steady thus far s/p 2U PRBC - Fe infusion completed 1/19 - still awaiting stool hemoccult - suspect this may be due to CKD, but Fe def also worrisome for occult GIB - last colo was 2011 and normal   Recent Labs  Lab 11/17/17 1816 11/18/17 0322 11/19/17 0344 11/20/17 0810  HGB 8.7* 6.6* 9.0* 9.4*    Marginal B12 Supplement x1 and recheck in 8-12 weeks   DM2 CBG controlled   HLD  DVT prophylaxis: SQ heparin  Code Status: FULL CODE Family Communication: spoke w/ pt and church friend at bedside   Disposition Plan: med bed - discuss LAD w/ Hematologist in AM   Consultants:  none  Procedures: none  Antimicrobials:  none   Objective: Blood  pressure (!) 148/72, pulse 78, temperature 98.6 F (37 C), temperature source Oral, resp. rate 18, height 5' (1.524 m), weight 78.9 kg (173 lb 15.1 oz), SpO2 95 %.  Intake/Output Summary (Last 24 hours) at 11/20/2017 1336 Last data filed at 11/20/2017 0543 Gross per 24 hour  Intake 670 ml  Output -  Net 670 ml   Filed Weights   11/18/17 0304 11/19/17 0512  Weight: 76.2 kg (167 lb 15.9 oz) 78.9 kg (173 lb 15.1 oz)    Examination: General: No acute respiratory distress - alert and pleasant    Lungs: CTA B - no wheeze  Cardiovascular: RRR - no M  Abdomen: Nontender, nondistended, soft, bowel sounds positive, no rebound, no ascites, no appreciable mass Extremities: No C/C/E B LE    CBC: Recent Labs  Lab 11/17/17 1816 11/18/17 0322 11/19/17 0344 11/20/17 0810  WBC 10.6* 6.4 7.6 7.5  NEUTROABS 6.2 3.5  --   --   HGB 8.7* 6.6* 9.0* 9.4*  HCT 28.8* 21.2* 28.5* 30.6*  MCV 72.2* 72.1* 75.8* 77.1*  PLT 299 217 203 836   Basic Metabolic Panel: Recent Labs  Lab 11/17/17 1816 11/18/17 0322 11/19/17 0344 11/20/17 0843  NA 143 146* 144 144  K 4.5 4.3 3.5 3.0*  CL 116* 119* 109 105  CO2 9* 14* 21* 26  GLUCOSE 165* 120* 115* 131*  BUN 107* 98* 52* 26*  CREATININE 4.92* 3.92* 1.88* 1.37*  CALCIUM 8.0* 7.8* 8.5* 8.8*   GFR: Estimated Creatinine Clearance: 36.5 mL/min (A) (by C-G formula based on SCr of 1.37 mg/dL (H)).  Liver Function Tests: Recent Labs  Lab 11/17/17 1816 11/19/17 0344 11/20/17 0843  AST 11* 9* 10*  ALT 9* 8* 7*  ALKPHOS 90 67 65  BILITOT 1.0 1.0 1.3*  PROT 6.8 5.1* 5.3*  ALBUMIN 3.8 3.0* 2.9*   Recent Labs  Lab 11/17/17 1816  LIPASE 137*    HbA1C: Hemoglobin A1C  Date/Time Value Ref Range Status  08/22/2017 09:36 AM 6.5  Final  05/13/2017 04:01 PM 7.5  Final   Hgb A1c MFr Bld  Date/Time Value Ref Range Status  07/16/2015 07:49 AM 10.8 (H) 4.6 - 6.5 % Final    Comment:    Glycemic Control Guidelines for People with Diabetes:Non Diabetic:  <6%Goal of Therapy: <7%Additional Action Suggested:  >8%   04/09/2015 07:41 AM 8.6 (H) 4.6 - 6.5 % Final    Comment:    Glycemic Control Guidelines for People with Diabetes:Non Diabetic:  <6%Goal of Therapy: <7%Additional Action Suggested:  >8%     CBG: Recent Labs  Lab 11/19/17 1149 11/19/17 1705 11/20/17 0336 11/20/17 0805 11/20/17 1112  GLUCAP 128* 102* 107* 125* 139*    Recent Results (from the past 240 hour(s))  MRSA PCR Screening     Status: None   Collection Time: 11/18/17   3:00 AM  Result Value Ref Range Status   MRSA by PCR NEGATIVE NEGATIVE Final    Comment:        The GeneXpert MRSA Assay (FDA approved for NASAL specimens only), is one component of a comprehensive MRSA colonization surveillance program. It is not intended to diagnose MRSA infection nor to guide or monitor treatment for MRSA infections.      Scheduled Meds: . aspirin EC  81 mg Oral Daily  . buPROPion  300 mg Oral Daily  . cholecalciferol  2,000 Units Oral Daily  . heparin  5,000 Units Subcutaneous Q8H  . insulin aspart  0-5 Units Subcutaneous  QHS  . insulin aspart  0-9 Units Subcutaneous TID WC  . pantoprazole  40 mg Oral Daily  . senna-docusate  1 tablet Oral BID  . simvastatin  40 mg Oral q1800  . sodium chloride flush  3 mL Intravenous Q12H   Continuous Infusions: .  sodium bicarbonate (isotonic) infusion in sterile water 50 mL/hr at 11/19/17 1718     LOS: 3 days    Cherene Altes, MD [facility] Office  [phone] Pager - Text Page per Shea Evans as per below:  On-Call/Text Page:      [email or web address]      password [email or web address]  If 7PM-7AM, please contact night-coverage [email or web address] Password [email or web address] 11/20/2017, 1:36 PM

## 2017-11-21 DIAGNOSIS — I9589 Other hypotension: Secondary | ICD-10-CM

## 2017-11-21 DIAGNOSIS — I10 Essential (primary) hypertension: Secondary | ICD-10-CM

## 2017-11-21 DIAGNOSIS — D509 Iron deficiency anemia, unspecified: Secondary | ICD-10-CM

## 2017-11-21 LAB — COMPREHENSIVE METABOLIC PANEL
ALT: 7 U/L — AB (ref 14–54)
AST: 10 U/L — AB (ref 15–41)
Albumin: 2.9 g/dL — ABNORMAL LOW (ref 3.5–5.0)
Alkaline Phosphatase: 64 U/L (ref 38–126)
Anion gap: 10 (ref 5–15)
BUN: 16 mg/dL (ref 6–20)
CHLORIDE: 105 mmol/L (ref 101–111)
CO2: 28 mmol/L (ref 22–32)
CREATININE: 1.32 mg/dL — AB (ref 0.44–1.00)
Calcium: 8.4 mg/dL — ABNORMAL LOW (ref 8.9–10.3)
GFR calc Af Amer: 47 mL/min — ABNORMAL LOW (ref >60)
GFR calc non Af Amer: 40 mL/min — ABNORMAL LOW (ref >60)
Glucose, Bld: 170 mg/dL — ABNORMAL HIGH (ref 65–99)
Potassium: 3.7 mmol/L (ref 3.5–5.1)
SODIUM: 143 mmol/L (ref 135–145)
Total Bilirubin: 0.9 mg/dL (ref 0.3–1.2)
Total Protein: 5.2 g/dL — ABNORMAL LOW (ref 6.5–8.1)

## 2017-11-21 LAB — CBC
HCT: 30.4 % — ABNORMAL LOW (ref 36.0–46.0)
Hemoglobin: 9.2 g/dL — ABNORMAL LOW (ref 12.0–15.0)
MCH: 24 pg — AB (ref 26.0–34.0)
MCHC: 30.3 g/dL (ref 30.0–36.0)
MCV: 79.2 fL (ref 78.0–100.0)
PLATELETS: 183 10*3/uL (ref 150–400)
RBC: 3.84 MIL/uL — ABNORMAL LOW (ref 3.87–5.11)
RDW: 20.1 % — AB (ref 11.5–15.5)
WBC: 9.4 10*3/uL (ref 4.0–10.5)

## 2017-11-21 LAB — GLUCOSE, CAPILLARY
Glucose-Capillary: 152 mg/dL — ABNORMAL HIGH (ref 65–99)
Glucose-Capillary: 126 mg/dL — ABNORMAL HIGH (ref 65–99)

## 2017-11-21 LAB — CA 125: Cancer Antigen (CA) 125: 7 U/mL (ref 0.0–38.1)

## 2017-11-21 MED ORDER — METOPROLOL SUCCINATE ER 25 MG PO TB24
25.0000 mg | ORAL_TABLET | Freq: Every day | ORAL | Status: DC
  Administered 2017-11-21: 25 mg via ORAL
  Filled 2017-11-21: qty 1

## 2017-11-21 MED ORDER — GABAPENTIN 600 MG PO TABS: 300.0000 mg | ORAL_TABLET | Freq: Three times a day (TID) | ORAL | Status: DC

## 2017-11-21 NOTE — Progress Notes (Signed)
Pt discharged to home with her husband.  Discharge instructions and follow appointment given.

## 2017-11-21 NOTE — Discharge Summary (Signed)
DISCHARGE SUMMARY  BETHZAIDA BOORD  MR#: [id number]  DOB:[birth year]  Date of Admission: 11/17/2017 Date of Discharge: 11/21/2017  Attending Physician:Tata Timmins Hennie Duos, MD   Patient's UYQ:IHKV, Marciano Sequin, MD  Consults:  none  Disposition: D/C home   Follow-up Appts: Follow-up Information    Lucille Passy, MD Follow up in 4 day(s).   Specialty:  Family Medicine Why:  Make an appointment to have your blood pressure and kidney function checked in 3-5 days.   Contact information: [place] [address] [phone]        [facility] Follow up.   Why:  If you are not contacted to make an appointment within the next 5 days, please call the [facility] at [phone].            Tests Needing Follow-up: -assure pt has been contacted by the [facility] for an appointment to evaluate her LAD -a repeat CT chest w/o contrast will be indicated in January 2020 to re-evaluate her pulmonary nodules -obtain outpt PET scan to evaluate her LAD and determine if there is a hypermetabolic node in a location amenable to biopsy/resection  -check renal function and volume status -check Hgb -check BP  -f/u results of tissue transglutaminase studies to r/o celiac disease -recheck of B12 level is suggested in 8-12 weeks   Discharge Diagnoses: Acute renal failure on CKD Abdom / Chest / Axillary LAD Hx of endometrial CA 1998 Multiple pulmonary nodules  Intermittent subacute diarrhea/loose stools Microcytic anemia - Fe deficiency  Marginal B12 DM2 HLD  Initial presentation: 67 y.o.femalewith a history of depression, anxiety, HTN, endometrial cancer, and DM2 who presented w/ abdominal discomfort, nausea, and anorexia for 4 days.    In the ED the patient was found to be tachycardic in the 110s with blood pressure in the 95J systolic. Chemistry panel revealed a BUN of 107 and creatinine of 4.92.  CT of the abd and pelvis demonstrated  pelvic lymphadenopathy and innumerable pulmonary nodulesin the bases concerning for metastases.  Hospital Course:  Acute renal failure on CKD crt 1.03 Mar 2017  and 1.05 Aug 2016 - has now returned to her baseline w/ volume expansion - acute component most consistent with simple prerenal azotemia/dehydration in setting of ACEi use - ACEi on hold at time of d/c   Hypotension Resolved w/ volume expansion   Abdom + chest + axillary LAD - Hx of endometrial CA 1998 the significance of her lymphadenopathy is not clear at this time, especially in the setting of what now appears to be unconnected benign pulmonary nodules - this could conceivably be connected to her intermittent loose stools, but may also be indicative of a lymphoproliferative disorder (CLL, lymphoma, etc)  - I discussed w/ the on call Oncologist today who advised f/u in the Oncology clinic for further workup - the first advised step was to obtain a PET scan - if the PET (an outpt procedure only) revealed a hypermetabolic node amenable to resection/biopsy this would then need to be arranged) - I have discussed this plan w/ the pt who agrees with it - she is advised to call the [facility] if she has not heard from them within 3-5 days  -last mamo Oct 2018 normal -last colo Jan 2011 normal - was told to repeat in 10 years    Multiple pulmonary nodules  review of records reveals pulmonary nodules were actually noted incidentally on cardiac CT chest March 2017 - this was  not discussed w/ the pt at the time and was not followed up (rec was for repeat imaging in 3-6 months) - contrasted CT chest 11/20/17 noted stable size of mult pulm nodules since March 2017, w/ the appearance most c/w a benign finding per Radiolgoy interpretation - a follow-up noncontrasted CT chest is suggested in 12 months - this was discussed w/ the pt   Intermittent subacute diarrhea/loose stools Etiology not clear - check for celiac w/ serologic tests pending - may  require GI eval as outpt if persists - no sx to suggest infection [place]   Microcytic anemia - Fe deficiency  Hgb holding steady s/p 2U PRBC - Fe infusion completed 1/19 - stool hemoccult negative - suspect this may be due to CKD, but Fe def also worrisome for occult GIB - last colo was 2011 and normal - may need to consider repeat colo before due in 2021, depending upon w/u of LAD   Marginal B12 Supplement x1 and recheck in 8-12 weeks   DM2 CBG controlled   HLD  Allergies as of 11/21/2017      Reactions   Gabapentin Other (See Comments)   confusion      Medication List    STOP taking these medications   benazepril-hydrochlorthiazide 20-12.5 MG tablet Commonly known as:  LOTENSIN HCT     TAKE these medications   aspirin 81 MG tablet Take 81 mg by mouth daily.   buPROPion 300 MG 24 hr tablet Commonly known as:  WELLBUTRIN XL Take 1 tablet (300 mg total) by mouth daily.   CALCIUM 500 PO Take by mouth 2 (two) times daily.   cholecalciferol 1000 units tablet Commonly known as:  VITAMIN D Take 2,000 Units by mouth daily.   clonazePAM 0.5 MG tablet Commonly known as:  KLONOPIN TAKE 1 TABLET BY MOUTH 3 TIMES DAILY AS NEEDED FOR ANXIETY   FREESTYLE FREEDOM LITE w/Device Kit Use to check blood sugar twice a day, E11.65   freestyle lancets Use to check blood sugar twice a day, E11.65   furosemide 20 MG tablet Commonly known as:  LASIX Take 1 tablet (20 mg total) by mouth daily as needed.   glipiZIDE 2.5 MG 24 hr tablet Commonly known as:  GLUCOTROL XL Take 1 tablet (2.5 mg total) by mouth 2 (two) times daily before a meal.   glucose blood test strip Commonly known as:  FREESTYLE LITE Use to check blood sugar twice a day, E11.65   HM MULTIVITAMIN ADULT GUMMY PO Take 1 tablet by mouth daily.   liraglutide 18 MG/3ML Sopn Commonly known as:  VICTOZA Inject 0.3 mLs (1.8 mg total) into the skin daily.   metFORMIN 500 MG 24  hr tablet Commonly known as:  GLUCOPHAGE-XR Take 2 tablets (1,000 mg total) by mouth 2 (two) times daily with a meal.   metoprolol succinate 25 MG 24 hr tablet Commonly known as:  TOPROL-XL TAKE 1 TABLET (25 MG TOTAL) BY MOUTH DAILY.   NEXIUM PO Take 22.5 mg by mouth daily.   simvastatin 40 MG tablet Commonly known as:  ZOCOR TAKE 1 TABLET BY MOUTH ONCE DAILY AT BEDTIME   UNIFINE PENTIPS 31G X 8 MM Misc Generic drug:  Insulin Pen Needle USE AS DIRECTED WITH VICTOZA       Day of Discharge BP (!) 156/71 (BP Location: Left Arm)   Pulse 81   Temp 98.9 F (37.2 C) (Oral)   Resp 20  Ht 5' (1.524 m)   Wt 78.9 kg (173 lb 15.1 oz)   SpO2 97%   BMI 33.97 kg/m   Physical Exam: General: No acute respiratory distress Lungs: Clear to auscultation bilaterally without wheezes or crackles Cardiovascular: Regular rate and rhythm without murmur gallop or rub normal S1 and S2 Abdomen: Nontender, nondistended, soft, bowel sounds positive, no rebound, no ascites, no appreciable mass Extremities: No significant cyanosis, clubbing, or edema bilateral lower extremities  Basic Metabolic Panel: Recent Labs  Lab 11/17/17 1816 11/18/17 0322 11/19/17 0344 11/20/17 0843 11/21/17 0734  NA 143 146* 144 144 143  K 4.5 4.3 3.5 3.0* 3.7  CL 116* 119* 109 105 105  CO2 9* 14* 21* 26 28  GLUCOSE 165* 120* 115* 131* 170*  BUN 107* 98* 52* 26* 16  CREATININE 4.92* 3.92* 1.88* 1.37* 1.32*  CALCIUM 8.0* 7.8* 8.5* 8.8* 8.4*    Liver Function Tests: Recent Labs  Lab 11/17/17 1816 11/19/17 0344 11/20/17 0843 11/21/17 0734  AST 11* 9* 10* 10*  ALT 9* 8* 7* 7*  ALKPHOS 90 67 65 64  BILITOT 1.0 1.0 1.3* 0.9  PROT 6.8 5.1* 5.3* 5.2*  ALBUMIN 3.8 3.0* 2.9* 2.9*   Recent Labs  Lab 11/17/17 1816  LIPASE 137*    CBC: Recent Labs  Lab 11/17/17 1816 11/18/17 0322 11/19/17 0344 11/20/17 0810 11/21/17 0734  WBC 10.6* 6.4 7.6 7.5 9.4  NEUTROABS 6.2 3.5  --   --   --   HGB 8.7* 6.6*  9.0* 9.4* 9.2*  HCT 28.8* 21.2* 28.5* 30.6* 30.4*  MCV 72.2* 72.1* 75.8* 77.1* 79.2  PLT 299 217 203 221 183     CBG: Recent Labs  Lab 11/20/17 1112 11/20/17 1713 11/20/17 2030 11/21/17 0811 11/21/17 1153  GLUCAP 139* 95 166* 152* 126*    Recent Results (from the past 240 hour(s))  MRSA PCR Screening     Status: None   Collection Time: 11/18/17  3:00 AM  Result Value Ref Range Status   MRSA by PCR NEGATIVE NEGATIVE Final    Comment:        The GeneXpert MRSA Assay (FDA approved for NASAL specimens only), is one component of a comprehensive MRSA colonization surveillance program. It is not intended to diagnose MRSA infection nor to guide or monitor treatment for MRSA infections.       Time spent in discharge (includes decision making & examination of pt): 35 minutes  11/21/2017, 2:48 PM   Cherene Altes, MD [facility] Office  [phone] Pager [phone]  On-Call/Text Page:      [email or web address]      password [facility]

## 2017-11-21 NOTE — Discharge Instructions (Signed)
Acute Kidney Injury, Adult Acute kidney injury is a sudden worsening of kidney function. The kidneys are organs that have several jobs. They filter the blood to remove waste products and extra fluid. They also maintain a healthy balance of minerals and hormones in the body, which helps control blood pressure and keep bones strong. With this condition, your kidneys do not do their jobs as well as they should. This condition ranges from mild to severe. Over time it may develop into long-lasting (chronic) kidney disease. Early detection and treatment may prevent acute kidney injury from developing into a chronic condition. What are the causes? Common causes of this condition include:  A problem with blood flow to the kidneys. This may be caused by: ? Low blood pressure (hypotension) or shock. ? Blood loss. ? Heart and blood vessel (cardiovascular) disease. ? Severe burns. ? Liver disease.  Direct damage to the kidneys. This may be caused by: ? Certain medicines. ? A kidney infection. ? Poisoning. ? Being around or in contact with toxic substances. ? A surgical wound. ? A hard, direct hit to the kidney area.  A sudden blockage of urine flow. This may be caused by: ? Cancer. ? Kidney stones. ? An enlarged prostate in males.  What are the signs or symptoms? Symptoms of this condition may not be obvious until the condition becomes severe. Symptoms of this condition can include:  Tiredness (lethargy), or difficulty staying awake.  Nausea or vomiting.  Swelling (edema) of the face, legs, ankles, or feet.  Problems with urination, such as: ? Abdominal pain, or pain along the side of your stomach (flank). ? Decreased urine production. ? Decrease in the force of urine flow.  Muscle twitches and cramps, especially in the legs.  Confusion or trouble concentrating.  Loss of appetite.  Fever.  How is this diagnosed? This condition may be diagnosed with tests, including:  Blood  tests.  Urine tests.  Imaging tests.  A test in which a sample of tissue is removed from the kidneys to be examined under a microscope (kidney biopsy).  How is this treated? Treatment for this condition depends on the cause and how severe the condition is. In mild cases, treatment may not be needed. The kidneys may heal on their own. In more severe cases, treatment will involve:  Treating the cause of the kidney injury. This may involve changing any medicines you are taking or adjusting your dosage.  Fluids. You may need specialized IV fluids to balance your body's needs.  Having a catheter placed to drain urine and prevent blockages.  Preventing problems from occurring. This may mean avoiding certain medicines or procedures that can cause further injury to the kidneys.  In some cases treatment may also require:  A procedure to remove toxic wastes from the body (dialysis or continuous renal replacement therapy - CRRT).  Surgery. This may be done to repair a torn kidney, or to remove the blockage from the urinary system.  Follow these instructions at home: Medicines  Take over-the-counter and prescription medicines only as told by your health care provider.  Do not take any new medicines without your health care provider's approval. Many medicines can worsen your kidney damage.  Do not take any vitamin and mineral supplements without your health care provider's approval. Many nutritional supplements can worsen your kidney damage. Lifestyle  If your health care provider prescribed changes to your diet, follow them. You may need to decrease the amount of protein you eat.  Achieve  and maintain a healthy weight. If you need help with this, ask your health care provider.  Start or continue an exercise plan. Try to exercise at least 30 minutes a day, 5 days a week.  Do not use any tobacco products, such as cigarettes, chewing tobacco, and e-cigarettes. If you need help quitting, ask  your health care provider. General instructions  Keep track of your blood pressure. Report changes in your blood pressure as told by your health care provider.  Stay up to date with immunizations. Ask your health care provider which immunizations you need.  Keep all follow-up visits as told by your health care provider. This is important. Where to find more information:  American Association of Kidney Patients: BombTimer.gl  National Kidney Foundation: www.kidney.[place]: https://mathis.com/  Life Options Rehabilitation Program: ? www.lifeoptions.org ? www.kidneyschool.org Contact a health care provider if:  Your symptoms get worse.  You develop new symptoms. Get help right away if:  You develop symptoms of worsening kidney disease, which include: ? Headaches. ? Abnormally dark or light skin. ? Easy bruising. ? Frequent hiccups. ? Chest pain. ? Shortness of breath. ? End of menstruation in women. ? Seizures. ? Confusion or altered mental status. ? Abdominal or back pain. ? Itchiness.  You have a fever.  Your body is producing less urine.  You have pain or bleeding when you urinate. Summary  Acute kidney injury is a sudden worsening of kidney function.  Acute kidney injury can be caused by problems with blood flow to the kidneys, direct damage to the kidneys, and sudden blockage of urine flow.  Symptoms of this condition may not be obvious until it becomes severe. Symptoms may include edema, lethargy, confusion, nausea or vomiting, and problems passing urine.  This condition can usually be diagnosed with blood tests, urine tests, and imaging tests. Sometimes a kidney biopsy is done to diagnose this condition.  Treatment for this condition often involves treating the underlying cause. It is treated with fluids, medicines, dialysis, diet changes, or surgery. This information is not intended to replace advice given to you by your health care provider.  Make sure you discuss any questions you have with your health care provider. Document Released: 05/03/2011 Document Revised: 02/17/2017 Document Reviewed: 10/08/2016 Elsevier Interactive Patient Education  2018 [employer].  Lymphadenopathy Lymphadenopathy refers to swollen or enlarged lymph glands, also called lymph nodes. Lymph glands are part of your body's defense (immune) system, which protects the body from infections, germs, and diseases. Lymph glands are found in many locations in your body, including the neck, underarm, and groin. Many things can cause lymph glands to become enlarged. When your immune system responds to germs, such as viruses or bacteria, infection-fighting cells and fluid build up. This causes the glands to grow in size. Usually, this is not something to worry about. The swelling and any soreness often go away without treatment. However, swollen lymph glands can also be caused by a number of diseases. Your health care provider may do various tests to help determine the cause. If the cause of your swollen lymph glands cannot be found, it is important to monitor your condition to make sure the swelling goes away. Follow these instructions at home: Watch your condition for any changes. The following actions may help to lessen any discomfort you are feeling:  Get plenty of rest.  Take medicines only as directed by your health care provider. Your health care provider may recommend over-the-counter medicines for pain.  Apply moist heat compresses to the site of swollen lymph nodes as directed by your health care provider. This can help reduce any pain.  Check your lymph nodes daily for any changes.  Keep all follow-up visits as directed by your health care provider. This is important.  Contact a health care provider if:  Your lymph nodes are still swollen after 2 weeks.  Your swelling increases or spreads to other areas.  Your lymph nodes are hard, seem fixed to the  skin, or are growing rapidly.  Your skin over the lymph nodes is red and inflamed.  You have a fever.  You have chills.  You have fatigue.  You develop a sore throat.  You have abdominal pain.  You have weight loss.  You have night sweats. Get help right away if:  You notice fluid leaking from the area of the enlarged lymph node.  You have severe pain in any area of your body.  You have chest pain.  You have shortness of breath. This information is not intended to replace advice given to you by your health care provider. Make sure you discuss any questions you have with your health care provider. Document Released: 07/27/2008 Document Revised: 03/25/2016 Document Reviewed: 05/23/2014 Elsevier Interactive Patient Education  [employer].

## 2017-11-21 NOTE — Progress Notes (Signed)
Pt complaining of severe headache, very agitated and very restless. Stated her headache is getting severe every single day. Morphine IV given. Will monitor pt.

## 2017-11-22 ENCOUNTER — Telehealth: Payer: Self-pay | Admitting: Behavioral Health

## 2017-11-22 ENCOUNTER — Telehealth: Payer: Self-pay | Admitting: Family Medicine

## 2017-11-22 LAB — TISSUE TRANSGLUTAMINASE, IGA: Tissue Transglutaminase Ab, IgA: <2 U/mL (ref 0–3)

## 2017-11-22 LAB — TISSUE TRANSGLUTAMINASE, IGG: Tissue Transglut Ab: <2 U/mL (ref 0–5)

## 2017-11-22 NOTE — Telephone Encounter (Signed)
In looking at pts appts; Brenda [name] already scheduled 30' hospital f/u on 11/28/17 at 11 AM with Dr Deborra Medina. appt scheduled on 11/21/17. Will fwd to to [facility].

## 2017-11-22 NOTE — Telephone Encounter (Signed)
Copied from [place]. Topic: General - Other >> Nov 22, 2017 10:32 AM Yvette Rack wrote: Reason for CRM: Nurse from the hospital calling wanting pt Provider to know that pt was released from [facility] yesterday and stating that the patient need a hospital f/u with her provider which was Dr Marjory Lies and that it will need to be within 4-5 days for the follow up to check pt blood pressure and kidney function the patient wasn't around for me to ask her if she was going to follow Dr Deborra Medina or stay at [facility]  I was trying to get her in with Myrtie Cruise or Carlean Purl it looks like they don't haveanything by the 30th I can't over ride appt either

## 2017-11-22 NOTE — Consult Note (Signed)
           [facility] CM Primary Care Navigator  11/22/2017  Brenda [name] Jul 24, 1951 [id number]   Went to see patient at the bedside to identify possible discharge needs but she was already discharged. Per MD note, patientpresented with abdominal discomfort,nauseaand anorexia.  CT of the abdomen and pelvis showed pelvic lymphadenopathy and innumerable pulmonary nodulesin the bases concerning for metastases.  Patient was discharged home yesterday.  Primary care provider's office called(spoke with Angie)to notify of patient's discharge and need for post hospital follow-up and transition of care. Notified of patient's health issues needing follow-up as well. Made aware to refer patient to [facility] care management if deemed necessary and appropriate for services.  Primary care provider's office notified that patient has discharge instruction tofollow-up withprimary care provider to have her blood pressure and kidney function checked in 3-5 days.   Angie from primary care provider's office stated that patient will have to be contacted to see if she will be moving to [place] with Dr. Bjorn Loser or will remain to be seen at [facility] office.   For questions, please contact:  Dannielle Huh, BSN, RN- [facility] Primary Care Navigator  Telephone: [phone] [place]

## 2017-11-23 ENCOUNTER — Ambulatory Visit: Payer: Self-pay | Admitting: Internal Medicine

## 2017-11-23 NOTE — Telephone Encounter (Signed)
Transition Care Management Follow-up Telephone Call  Date of Admission: 11/17/2017 Date of Discharge: 11/21/2017   How have you been since you were released from the hospital? Patient stated, "I still don't have much of an appetite".   Do you understand why you were in the hospital? yes   Do you understand the discharge instructions? yes   Where were you discharged to? Home   Items Reviewed:  Medications reviewed: yes  Allergies reviewed: yes  Dietary changes reviewed: yes, patient voiced no changes  Referrals reviewed: yes, follow-up with PCP & Oncologist   Functional Questionnaire:   Activities of Daily Living (ADLs):   She states they are independent in the following: ambulation, bathing and hygiene, feeding, continence, grooming, toileting and dressing States they require assistance with the following: None   Any transportation issues/concerns?: no   Any patient concerns? no   Confirmed importance and date/time of follow-up visits scheduled yes, 11/28/17 at 11:00 AM.  Provider Appointment booked with Dr. Deborra Medina.  Confirmed with patient if condition begins to worsen call PCP or go to the ER.  Patient was given the office number and encouraged to call back with question or concerns.  : yes

## 2017-11-24 ENCOUNTER — Other Ambulatory Visit: Payer: Self-pay | Admitting: Family Medicine

## 2017-11-24 MED FILL — METOPROLOL SUCC ER 25 MG TA: 25 | 90 days supply | Qty: 90 | Fill #1

## 2017-11-25 ENCOUNTER — Telehealth: Payer: Self-pay | Admitting: Hematology

## 2017-11-25 NOTE — Telephone Encounter (Signed)
Patient left message to confirm appointment for 1/29 with Dr Irene Limbo.

## 2017-11-25 NOTE — Telephone Encounter (Signed)
Left message on voicemail for patient regarding appointment w/ Dr Irene Limbo per staff message 11/25/17.

## 2017-11-28 ENCOUNTER — Encounter: Payer: Self-pay | Admitting: Family Medicine

## 2017-11-28 ENCOUNTER — Ambulatory Visit (INDEPENDENT_AMBULATORY_CARE_PROVIDER_SITE_OTHER): Payer: Medicare Other | Admitting: Family Medicine

## 2017-11-28 VITALS — BP 134/74 | HR 70 | Temp 98.1°F | Ht 60.0 in | Wt 180.8 lb

## 2017-11-28 DIAGNOSIS — N189 Chronic kidney disease, unspecified: Secondary | ICD-10-CM | POA: Diagnosis not present

## 2017-11-28 DIAGNOSIS — I10 Essential (primary) hypertension: Secondary | ICD-10-CM

## 2017-11-28 DIAGNOSIS — D509 Iron deficiency anemia, unspecified: Secondary | ICD-10-CM

## 2017-11-28 DIAGNOSIS — E876 Hypokalemia: Secondary | ICD-10-CM | POA: Insufficient documentation

## 2017-11-28 DIAGNOSIS — R590 Localized enlarged lymph nodes: Secondary | ICD-10-CM

## 2017-11-28 DIAGNOSIS — N179 Acute kidney failure, unspecified: Secondary | ICD-10-CM | POA: Insufficient documentation

## 2017-11-28 LAB — CBC
HCT: 33.5 % — ABNORMAL LOW (ref 36.0–46.0)
Hemoglobin: 10.2 g/dL — ABNORMAL LOW (ref 12.0–15.0)
MCHC: 30.5 g/dL (ref 30.0–36.0)
MCV: 82.7 fl (ref 78.0–100.0)
Platelets: 291 10*3/uL (ref 150.0–400.0)
RBC: 4.05 Mil/uL (ref 3.87–5.11)
RDW: 22.2 % — AB (ref 11.5–15.5)
WBC: 11.8 10*3/uL — ABNORMAL HIGH (ref 4.0–10.5)

## 2017-11-28 LAB — COMPREHENSIVE METABOLIC PANEL
ALK PHOS: 83 U/L (ref 39–117)
ALT: 9 U/L (ref 0–35)
AST: 11 U/L (ref 0–37)
Albumin: 3.9 g/dL (ref 3.5–5.2)
BUN: 16 mg/dL (ref 6–23)
CALCIUM: 8 mg/dL — AB (ref 8.4–10.5)
CHLORIDE: 108 meq/L (ref 96–112)
CO2: 25 mEq/L (ref 19–32)
CREATININE: 1.37 mg/dL — AB (ref 0.40–1.20)
GFR: 40.64 mL/min — ABNORMAL LOW (ref >60.00)
Glucose, Bld: 84 mg/dL (ref 70–99)
Potassium: 4.2 mEq/L (ref 3.5–5.1)
SODIUM: 147 meq/L — AB (ref 135–145)
TOTAL PROTEIN: 6.3 g/dL (ref 6.0–8.3)
Total Bilirubin: 0.6 mg/dL (ref 0.2–1.2)

## 2017-11-28 MED FILL — clonazePAM 0.5 MG TABS: 0.5 | 30 days supply | Qty: 90 | Fill #0

## 2017-11-28 NOTE — Assessment & Plan Note (Signed)
Has not restarted ACEI. Normotensive today, advised to continue holding this. She is taking her metoprolol. Check CMET today.

## 2017-11-28 NOTE — Assessment & Plan Note (Signed)
S/p 2 units PRBCs. CBC today. Has appt with hematology tomorrow.

## 2017-11-28 NOTE — Patient Instructions (Signed)
Great to see you.  Happy Birthday!   I will call you with your lab results from today and you can view them online.

## 2017-11-28 NOTE — Assessment & Plan Note (Signed)
Etiology unclear- has appt with hematology tomorrow.

## 2017-11-28 NOTE — Progress Notes (Signed)
Subjective:   Patient ID: Brenda [name], female    DOB: Apr 08, 1951, 67 y.o.   MRN: [id number]  Brenda [name] is a pleasant 67 y.o. year old female who presents to clinic today with Hospitalization Follow-up (Patient is here today to F/U from hospitalization on 1.17.19-1.21.19. She states that she is doing much better.  She was told to F/U in 3-5d with PCP to check her BP & renal function.  She will need a CT Chest in Jan 2020 to re-eval Pulm Nodules.)  on 11/28/2017  HPI:  Notes reviewed. Admitted [facility] 1/17- 11/21/17 after presenting to the ER with nausea, abdominal pain and anorexia for 4 days.  In ED, was found to be tachycardic, hypotensive. Cr was 4.92, BUM 107  Also anemic- given 2 units PRBCs. Lab Results  Component Value Date   WBC 9.4 11/21/2017   HGB 9.2 (L) 11/21/2017   HCT 30.4 (L) 11/21/2017   MCV 79.2 11/21/2017   PLT 183 11/21/2017    CT of abd/pelvis showed pelvic lymphadenopathy with innumerable pulmonary nodules concerns for mets.  Ct Abdomen Pelvis Wo Contrast  Result Date: 11/17/2017 CLINICAL DATA:  Right upper quadrant pain. EXAM: CT ABDOMEN AND PELVIS WITHOUT CONTRAST TECHNIQUE: Multidetector CT imaging of the abdomen and pelvis was performed following the standard protocol without IV contrast. COMPARISON:  None. FINDINGS: Lower chest: Innumerable pulmonary nodules in the lung bases, measuring up to 8 mm. Hepatobiliary: No focal liver abnormality. Cholelithiasis. The gallbladder is mildly distended. No gallbladder wall thickening or biliary dilatation. Pancreas: Unremarkable. No pancreatic ductal dilatation or surrounding inflammatory changes. Spleen: Normal in size without focal abnormality. Adrenals/Urinary Tract: The adrenal glands are unremarkable. Mild bilateral perinephric fat stranding, nonspecific. No focal renal lesion. No renal or ureteral calculi. No hydronephrosis. The bladder is unremarkable. Stomach/Bowel: Stomach is within normal limits.  Appendix appears normal. No evidence of bowel wall thickening, distention, or inflammatory changes. Vascular/Lymphatic: Aortic atherosclerosis. Prominent, borderline enlarged retroperitoneal lymph nodes measuring up to 9 mm in short axis. Enlarged bilateral pelvic sidewall lymph nodes, measuring up to 2.2 cm in short axis. Enlarged right external iliac lymph node measuring up to 1.7 cm in short axis. Reproductive: Status post hysterectomy. No adnexal masses. Other: No free fluid or pneumoperitoneum. Musculoskeletal: No acute or significant osseous findings. Severe degenerative changes of the thoracolumbar spine. Grade 1 anterolisthesis at L4-L5. IMPRESSION: 1. Pelvic lymphadenopathy and innumerable pulmonary nodules in the lung bases, consistent with metastatic disease. 2. Cholelithiasis without CT evidence of acute cholecystitis. 3.  Aortic atherosclerosis (ICD10-I70.0). Electronically Signed   By: Titus Dubin M.D.   On: 11/17/2017 20:41   Ct Chest W Contrast  Result Date: 11/20/2017 CLINICAL DATA:  67 year old female with history of pulmonary nodule. Followup study. EXAM: CT CHEST WITH CONTRAST TECHNIQUE: Multidetector CT imaging of the chest was performed during intravenous contrast administration. CONTRAST:  55m ISOVUE-300 IOPAMIDOL (ISOVUE-300) INJECTION 61% COMPARISON:  Coronary calcium score CT scan 01/13/2016. CT the abdomen and pelvis 11/17/2017. FINDINGS: Cardiovascular: Heart size is mildly enlarged. There is no significant pericardial fluid, thickening or pericardial calcification. There is aortic atherosclerosis, as well as atherosclerosis of the great vessels of the mediastinum and the coronary arteries, including calcified atherosclerotic plaque in the left main, left anterior descending, left circumflex and right coronary arteries. Mediastinum/Nodes: Multiple borderline enlarged mediastinal lymph nodes measuring up to 11 mm in short axis in the high right paratracheal nodal station.  Esophagus is unremarkable in appearance. Multiple prominent borderline enlarged axillary lymph nodes bilaterally  measuring up to 9 mm in short axis on the left. Mildly enlarged right subpectoral lymph node measuring 1.4 cm in short axis. Lungs/Pleura: Again noted are numerous small pulmonary nodules scattered throughout the lungs bilaterally. When compared to prior cardiac CT 01/13/2016, all previously noted pulmonary nodules are stable in size and number. The largest of these nodules is in the left lower lobe (axial image 89 of series 4) measuring 9 x 8 mm. In the upper lungs which were not visualized on the prior chest CT there also several small pulmonary nodules which measure 3 mm or less in size. No larger more suspicious appearing pulmonary nodules or masses. No acute consolidative airspace disease. Trace right pleural effusion lying dependently. No left pleural effusion. Mild scarring and atelectasis noted in the medial aspect of the right lower lobe. Upper Abdomen: Calcified gallstones lying dependently in the gallbladder measuring up to 1.5 cm in diameter. Aortic atherosclerosis. Musculoskeletal: There are no aggressive appearing lytic or blastic lesions noted in the visualized portions of the skeleton. IMPRESSION: 1. While there are multiple small pulmonary nodules scattered throughout the lungs bilaterally measuring up to 8 x 9 mm in the left lower lobe, these appear unchanged when compared to prior cardiac CT 01/13/2016, and are considered benign. In the upper lungs (which were not imaged on the prior examination, there are several small 1-3 mm pulmonary nodules which are also strongly favored to be benign. No follow-up needed if patient is low-risk (and has no known or suspected primary neoplasm). Non-contrast chest CT can be considered in 12 months if patient is high-risk. This recommendation follows the consensus statement: Guidelines for Management of Incidental Pulmonary Nodules Detected on CT  Images: From the Fleischner Society 2017; Radiology 2017; 284:228-243. 2. Several borderline enlarged and mildly enlarged mediastinal and bilateral axillary/subpectoral lymph nodes. Given the lymphadenopathy in the pelvis, clinical correlation for signs and symptoms of lymphoproliferative disorder is suggested. 3. Aortic atherosclerosis, in addition to left main and 3 vessel coronary artery disease. Please note that although the presence of coronary artery calcium documents the presence of coronary artery disease, the severity of this disease and any potential stenosis cannot be assessed on this non-gated CT examination. Assessment for potential risk factor modification, dietary therapy or pharmacologic therapy may be warranted, if clinically indicated. 4. Trace right pleural effusion lying dependently. Aortic Atherosclerosis (ICD10-I70.0). Electronically Signed   By: Vinnie Langton M.D.   On: 11/20/2017 12:01    Oncology was consulted and advised outpatient PET scan. Mammogram neg 10/18 Colonoscopy 11/2009- normal Does have h/o endometrial CA in 1998- s/p hysterectomy  At d/c Cr improved to baseline- felt likely pre renal.  ACEI held at time of d/c.  Lab Results  Component Value Date   CREATININE 1.32 (H) 11/21/2017   Current Outpatient Medications on File Prior to Visit  Medication Sig Dispense Refill  . aspirin 81 MG tablet Take 81 mg by mouth daily.    . Blood Glucose Monitoring Suppl (FREESTYLE FREEDOM LITE) w/Device KIT Use to check blood sugar twice a day, E11.65 1 each 1  . buPROPion (WELLBUTRIN XL) 300 MG 24 hr tablet Take 1 tablet (300 mg total) by mouth daily. 90 tablet 3  . Calcium-Magnesium-Vitamin D (CALCIUM 500 PO) Take by mouth 2 (two) times daily.    . cholecalciferol (VITAMIN D) 1000 UNITS tablet Take 2,000 Units by mouth daily.     . clonazePAM (KLONOPIN) 0.5 MG tablet TAKE 1 TABLET BY MOUTH 3 TIMES DAILY AS NEEDED FOR  ANXIETY 90 tablet 3  . Esomeprazole Magnesium (NEXIUM  PO) Take 22.5 mg by mouth daily.    . furosemide (LASIX) 20 MG tablet Take 1 tablet (20 mg total) by mouth daily as needed. 90 tablet 3  . glipiZIDE (GLUCOTROL XL) 2.5 MG 24 hr tablet Take 1 tablet (2.5 mg total) by mouth 2 (two) times daily before a meal. 180 tablet 3  . glucose blood (FREESTYLE LITE) test strip Use to check blood sugar twice a day, E11.65 200 each 12  . Lancets (FREESTYLE) lancets Use to check blood sugar twice a day, E11.65 200 each 12  . liraglutide (VICTOZA) 18 MG/3ML SOPN Inject 0.3 mLs (1.8 mg total) into the skin daily. 27 mL 3  . metFORMIN (GLUCOPHAGE-XR) 500 MG 24 hr tablet Take 2 tablets (1,000 mg total) by mouth 2 (two) times daily with a meal. 360 tablet 3  . metoprolol succinate (TOPROL-XL) 25 MG 24 hr tablet TAKE 1 TABLET (25 MG TOTAL) BY MOUTH DAILY. 90 tablet 1  . Multiple Vitamins-Minerals (HM MULTIVITAMIN ADULT GUMMY PO) Take 1 tablet by mouth daily.    . simvastatin (ZOCOR) 40 MG tablet TAKE 1 TABLET BY MOUTH ONCE DAILY AT BEDTIME 90 tablet 3  . UNIFINE PENTIPS 31G X 8 MM MISC USE AS DIRECTED WITH VICTOZA 100 each 88   No current facility-administered medications on file prior to visit.     Allergies  Allergen Reactions  . Gabapentin Other (See Comments)    confusion    Past Medical History:  Diagnosis Date  . Anxiety   . Arthritis   . Depression    Wellbutrin  . Diabetes mellitus    Type II  . Endometrial ca ([place])    1998  . 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 GERD (gastroesophageal reflux disease)   . Heart murmur    patient states "cardiologist has not heard heart murmur for past several years"  . Heel spur   . Hyperlipidemia   . Hypertension   . Left leg swelling    "swelling in left leg from knee down when sitting for prolonged periods or being on feet for a long period of time"  . PONV (postoperative nausea and vomiting)    after hysterectomy  . Renal insufficiency     Past Surgical  History:  Procedure Laterality Date  . ABDOMINAL HYSTERECTOMY  1998  . COLONOSCOPY    . EYE SURGERY Bilateral    cataracts  . EYE SURGERY Left    retina tear  . FEMUR IM NAIL  10/25/2012   Procedure: INTRAMEDULLARY (IM) NAIL FEMORAL;  Surgeon: Mauri Pole, MD;  Location: [facility];  Service: Orthopedics;  Laterality: Left;  . HIP SURGERY    . left knee arthroscopy  12/2010  . REFRACTIVE SURGERY    . TOTAL KNEE ARTHROPLASTY  12/20/2011   Procedure: TOTAL KNEE ARTHROPLASTY;  Surgeon: Gearlean Alf, MD;  Location: [facility];  Service: Orthopedics;  Laterality: Left;  Failed attempt at spinal   . TOTAL SHOULDER ARTHROPLASTY Right 08/19/2016   Procedure: RIGHT TOTAL SHOULDER ARTHROPLASTY;  Surgeon: Justice Britain, MD;  Location: [place];  Service: Orthopedics;  Laterality: Right;    Family History  Problem Relation Age of Onset  . Cancer Mother        breast cancer  . Cancer Father        plasma sarcoma  . Breast cancer Neg Hx  Social History   Socioeconomic History  . Marital status: Single    Spouse name: Not on file  . Number of children: Not on file  . Years of education: Not on file  . Highest education level: Not on file  Social Needs  . Financial resource strain: Not on file  . Food insecurity - worry: Not on file  . Food insecurity - inability: Not on file  . Transportation needs - medical: Not on file  . Transportation needs - non-medical: Not on file  Occupational History  . 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 Smoking status: Never Smoker  . Smokeless tobacco: Never Used  Substance and Sexual Activity  . Alcohol use: No  . Drug use: No  . Sexual activity: Not on file  Other Topics Concern  . 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, Social History, Family History, Medications, and allergies have been reviewed in [facility], and have been updated if relevant.   Review of  Systems  Constitutional: Positive for fatigue.  HENT: Negative.   Respiratory: Negative.   Cardiovascular: Negative.   Gastrointestinal: Negative for abdominal distention, abdominal pain and diarrhea.  Endocrine: Negative.   Musculoskeletal: Negative.   Hematological: Negative.   All other systems reviewed and are negative.      Objective:    BP 134/74 (BP Location: Left Arm, Patient Position: Sitting, Cuff Size: Normal)   Pulse 70   Temp 98.1 F (36.7 C) (Oral)   Ht 5' (1.524 m)   Wt 180 lb 12.8 oz (82 kg)   SpO2 97%   BMI 35.31 kg/m   Wt Readings from Last 3 Encounters:  11/28/17 180 lb 12.8 oz (82 kg)  11/19/17 173 lb 15.1 oz (78.9 kg)  08/22/17 181 lb 1.9 oz (82.2 kg)    Physical Exam  Constitutional: She is oriented to person, place, and time. She appears well-developed and well-nourished. No distress.  HENT:  Head: Normocephalic and atraumatic.  Eyes: Conjunctivae are normal.  Cardiovascular: Normal rate and regular rhythm.  Pulmonary/Chest: Effort normal and breath sounds normal.  Musculoskeletal: Normal range of motion.  Neurological: She is alert and oriented to person, place, and time. No cranial nerve deficit.  Skin: Skin is warm and dry. She is not diaphoretic.  Psychiatric: She has a normal mood and affect. Her behavior is normal. Judgment and thought content normal.  Nursing note and vitals reviewed.         Assessment & Plan:   Pelvic lymphadenopathy - Plan: CBC  Acute renal failure superimposed on chronic kidney disease, unspecified CKD stage, unspecified acute renal failure type ([place]) - Plan: Comprehensive metabolic panel  Microcytic anemia  Hypokalemia No Follow-up on file.

## 2017-11-28 NOTE — Assessment & Plan Note (Signed)
See above

## 2017-11-29 ENCOUNTER — Inpatient Hospital Stay: Payer: Medicare Other | Attending: Hematology | Admitting: Hematology

## 2017-11-29 ENCOUNTER — Telehealth: Payer: Self-pay | Admitting: Hematology

## 2017-11-29 ENCOUNTER — Inpatient Hospital Stay: Payer: Medicare Other

## 2017-11-29 ENCOUNTER — Encounter: Payer: Self-pay | Admitting: Hematology

## 2017-11-29 VITALS — BP 167/73 | HR 76 | Temp 98.7°F | Resp 20 | Ht 60.0 in | Wt 182.5 lb

## 2017-11-29 DIAGNOSIS — E538 Deficiency of other specified B group vitamins: Secondary | ICD-10-CM

## 2017-11-29 DIAGNOSIS — R590 Localized enlarged lymph nodes: Secondary | ICD-10-CM | POA: Diagnosis not present

## 2017-11-29 DIAGNOSIS — C801 Malignant (primary) neoplasm, unspecified: Secondary | ICD-10-CM

## 2017-11-29 DIAGNOSIS — Z8542 Personal history of malignant neoplasm of other parts of uterus: Secondary | ICD-10-CM | POA: Insufficient documentation

## 2017-11-29 DIAGNOSIS — D479 Neoplasm of uncertain behavior of lymphoid, hematopoietic and related tissue, unspecified: Secondary | ICD-10-CM

## 2017-11-29 DIAGNOSIS — E611 Iron deficiency: Secondary | ICD-10-CM

## 2017-11-29 DIAGNOSIS — Z90710 Acquired absence of both cervix and uterus: Secondary | ICD-10-CM | POA: Diagnosis not present

## 2017-11-29 DIAGNOSIS — D509 Iron deficiency anemia, unspecified: Secondary | ICD-10-CM | POA: Diagnosis not present

## 2017-11-29 DIAGNOSIS — R591 Generalized enlarged lymph nodes: Secondary | ICD-10-CM

## 2017-11-29 DIAGNOSIS — R918 Other nonspecific abnormal finding of lung field: Secondary | ICD-10-CM | POA: Diagnosis not present

## 2017-11-29 LAB — CMP (CANCER CENTER ONLY)
ALBUMIN: 3.6 g/dL (ref 3.5–5.0)
ALK PHOS: 99 U/L (ref 40–150)
ALT: 8 U/L (ref 0–55)
ANION GAP: 11 (ref 3–11)
AST: 11 U/L (ref 5–34)
BILIRUBIN TOTAL: 0.4 mg/dL (ref 0.2–1.2)
BUN: 17 mg/dL (ref 7–26)
CALCIUM: 8 mg/dL — AB (ref 8.4–10.4)
CO2: 23 mmol/L (ref 22–29)
Chloride: 111 mmol/L — ABNORMAL HIGH (ref 98–109)
Creatinine: 1.6 mg/dL — ABNORMAL HIGH (ref 0.60–1.10)
GFR, EST AFRICAN AMERICAN: 37 mL/min — AB (ref >60)
GFR, EST NON AFRICAN AMERICAN: 32 mL/min — AB (ref >60)
Glucose, Bld: 64 mg/dL — ABNORMAL LOW (ref 70–140)
POTASSIUM: 3.7 mmol/L (ref 3.3–4.7)
Sodium: 145 mmol/L (ref 136–145)
TOTAL PROTEIN: 6.3 g/dL — AB (ref 6.4–8.3)

## 2017-11-29 LAB — CBC WITH DIFFERENTIAL (CANCER CENTER ONLY)
BASOS ABS: 0 10*3/uL (ref 0.0–0.1)
BASOS PCT: 0 %
EOS ABS: 0.3 10*3/uL (ref 0.0–0.5)
Eosinophils Relative: 3 %
HEMATOCRIT: 35.7 % (ref 34.8–46.6)
HEMOGLOBIN: 10.7 g/dL — AB (ref 11.6–15.9)
Lymphocytes Relative: 34 %
Lymphs Abs: 4 10*3/uL — ABNORMAL HIGH (ref 0.9–3.3)
MCH: 25.3 pg (ref 25.1–34.0)
MCHC: 30 g/dL — AB (ref 31.5–36.0)
MCV: 84.4 fL (ref 79.5–101.0)
MONOS PCT: 6 %
Monocytes Absolute: 0.7 10*3/uL (ref 0.1–0.9)
NEUTROS ABS: 6.8 10*3/uL — AB (ref 1.5–6.5)
NEUTROS PCT: 57 %
Platelet Count: 273 10*3/uL (ref 145–400)
RBC: 4.23 MIL/uL (ref 3.70–5.45)
RDW: 19.7 % — ABNORMAL HIGH (ref 11.2–16.1)
WBC: 11.8 10*3/uL — AB (ref 3.9–10.3)

## 2017-11-29 LAB — RETICULOCYTES
RBC.: 4.23 MIL/uL (ref 3.70–5.45)
RETIC COUNT ABSOLUTE: 88.8 10*3/uL (ref 33.7–90.7)
RETIC CT PCT: 2.1 % (ref 0.7–2.1)

## 2017-11-29 LAB — LACTATE DEHYDROGENASE: LDH: 222 U/L (ref 125–245)

## 2017-11-29 NOTE — Progress Notes (Signed)
HEMATOLOGY/ONCOLOGY CONSULTATION NOTE  Date of Service: 11/29/2017  Patient Care Team: Lucille Passy, MD as PCP - General Rockey Situ Kathlene November, MD as Consulting Physician (Cardiology)  CHIEF COMPLAINTS/PURPOSE OF CONSULTATION:  Lymphadenopathy and anemia  HISTORY OF PRESENTING ILLNESS:   Brenda [name] is a wonderful 67 y.o. female who was hospitalized on 11/17/17 for abdominal discomfort, nausea and anorexia.   Further workup showed elevated kidney function tests and CT of the abd and pelvis demonstrated pelvic lymphadenopathy and innumerable pulmonary nodulesin the bases concerning for metastases.  Today she is here for an outpatient follow up accompanied by her friend. She notes she has not felt her usual self in the past 2 months with fatigue and lack of appetite. She also noticed diarrhea that comes and goes and her energy level would continue to drop. She has lost 25-30 pounds since end of summer 2018. She will notice mucous with her stool but no bloody or black stool. She does not take any meds that she would associate with her diarrhea or lower appetite and she denies change in her chronic medications. She has been on Nexium for a long period of time. She notes she does not often get sick. Dr. Deborra Medina is her PCP. She was using 235m ibuprofen 2-3 times a day more often recently due to her hip pain.   She notes her mother had breast cancer and diagnosed at 570yo. Her father had a rare form of cancer who died 3 weeks after diagnosis in his 865s Her brother passed from non-alcohol liver cirrhosis.   On review of symptoms, pt notes since her discharge she no longer has abdominal pain but will still have occasional diarrhea. She has not had diarrhea in several days. She notes to having a slight cough but mostly sinus drainage. Her appetite and eating has improved. She notes due to her knee replacement and hip fracture she will have swelling in her left leg. She has lower back pain at times.  She denies feeling lumps or bumps in her axilla b/l or groin. She denies dysuria or change in her urination.     MEDICAL HISTORY:  Past Medical History:  Diagnosis Date  . Anxiety   . Arthritis   . Depression    Wellbutrin  . Diabetes mellitus    Type II  . Endometrial ca ([facility]    1998  . 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 GERD (gastroesophageal reflux disease)   . Heart murmur    patient states "cardiologist has not heard heart murmur for past several years"  . Heel spur   . Hyperlipidemia   . Hypertension   . Left leg swelling    "swelling in left leg from knee down when sitting for prolonged periods or being on feet for a long period of time"  . PONV (postoperative nausea and vomiting)    after hysterectomy  . Renal insufficiency     SURGICAL HISTORY: Past Surgical History:  Procedure Laterality Date  . ABDOMINAL HYSTERECTOMY  1998  . COLONOSCOPY    . EYE SURGERY Bilateral    cataracts  . EYE SURGERY Left    retina tear  . FEMUR IM NAIL  10/25/2012   Procedure: INTRAMEDULLARY (IM) NAIL FEMORAL;  Surgeon: MMauri Pole MD;  Location: [facility];  Service: Orthopedics;  Laterality: Left;  . HIP SURGERY    . left knee arthroscopy  12/2010  . REFRACTIVE SURGERY    .  TOTAL KNEE ARTHROPLASTY  12/20/2011   Procedure: TOTAL KNEE ARTHROPLASTY;  Surgeon: Gearlean Alf, MD;  Location: [facility];  Service: Orthopedics;  Laterality: Left;  Failed attempt at spinal   . TOTAL SHOULDER ARTHROPLASTY Right 08/19/2016   Procedure: RIGHT TOTAL SHOULDER ARTHROPLASTY;  Surgeon: Justice Britain, MD;  Location: [facility];  Service: Orthopedics;  Laterality: Right;    SOCIAL HISTORY: Social History   Socioeconomic History  . Marital status: Single    Spouse name: Not on file  . Number of children: Not on file  . Years of education: Not on file  . Highest education level: Not on file  Social Needs  . Financial resource strain: Not on file  . Food  insecurity - worry: Not on file  . Food insecurity - inability: Not on file  . Transportation needs - medical: Not on file  . Transportation needs - non-medical: Not on file  Occupational History  . Occupation: Therapist, sports at [employer]: [place]  Tobacco Use  . Smoking status: Never Smoker  . Smokeless tobacco: Never Used  Substance and Sexual Activity  . Alcohol use: No  . Drug use: No  . Sexual activity: Not on file  Other Topics Concern  . 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 Cancer Mother        breast cancer  . Cancer Father        plasma sarcoma  . Breast cancer Neg Hx     ALLERGIES:  is allergic to gabapentin.  MEDICATIONS:  Current Outpatient Medications  Medication Sig Dispense Refill  . aspirin 81 MG tablet Take 81 mg by mouth daily.    . Blood Glucose Monitoring Suppl (FREESTYLE FREEDOM LITE) w/Device KIT Use to check blood sugar twice a day, E11.65 1 each 1  . buPROPion (WELLBUTRIN XL) 300 MG 24 hr tablet Take 1 tablet (300 mg total) by mouth daily. 90 tablet 3  . Calcium-Magnesium-Vitamin D (CALCIUM 500 PO) Take by mouth 2 (two) times daily.    . cholecalciferol (VITAMIN D) 1000 UNITS tablet Take 2,000 Units by mouth daily.     . clonazePAM (KLONOPIN) 0.5 MG tablet TAKE 1 TABLET BY MOUTH 3 TIMES DAILY AS NEEDED FOR ANXIETY 90 tablet 3  . Esomeprazole Magnesium (NEXIUM PO) Take 22.5 mg by mouth daily.    . furosemide (LASIX) 20 MG tablet Take 1 tablet (20 mg total) by mouth daily as needed. 90 tablet 3  . glipiZIDE (GLUCOTROL XL) 2.5 MG 24 hr tablet Take 1 tablet (2.5 mg total) by mouth 2 (two) times daily before a meal. 180 tablet 3  . glucose blood (FREESTYLE LITE) test strip Use to check blood sugar twice a day, E11.65 200 each 12  . Lancets (FREESTYLE) lancets Use to check blood sugar twice a day, E11.65 200 each 12  . liraglutide  (VICTOZA) 18 MG/3ML SOPN Inject 0.3 mLs (1.8 mg total) into the skin daily. 27 mL 3  . metFORMIN (GLUCOPHAGE-XR) 500 MG 24 hr tablet Take 2 tablets (1,000 mg total) by mouth 2 (two) times daily with a meal. 360 tablet 3  . metoprolol succinate (TOPROL-XL) 25 MG 24 hr tablet TAKE 1 TABLET (25 MG TOTAL) BY MOUTH DAILY. 90 tablet 1  . Multiple Vitamins-Minerals (HM MULTIVITAMIN ADULT  GUMMY PO) Take 1 tablet by mouth daily.    . simvastatin (ZOCOR) 40 MG tablet TAKE 1 TABLET BY MOUTH ONCE DAILY AT BEDTIME 90 tablet 3  . UNIFINE PENTIPS 31G X 8 MM MISC USE AS DIRECTED WITH VICTOZA 100 each 88   No current facility-administered medications for this visit.     REVIEW OF SYSTEMS:    10 Point review of Systems was done is negative except as noted above.  PHYSICAL EXAMINATION: ECOG PERFORMANCE STATUS: 1 - Symptomatic but completely ambulatory  . Vitals:   11/29/17 1242  BP: (!) 167/73  Pulse: 76  Resp: 20  Temp: 98.7 F (37.1 C)  SpO2: 97%   Filed Weights   11/29/17 1242  Weight: 182 lb 8 oz (82.8 kg)   .Body mass index is 35.64 kg/m.  GENERAL:alert, in no acute distress and comfortable SKIN: no acute rashes, no significant lesions EYES: conjunctiva are pink and non-injected, sclera anicteric OROPHARYNX: MMM, no exudates, no oropharyngeal erythema or ulceration NECK: supple, no JVD LYMPH:  no palpable lymphadenopathy in the cervical, axillary or inguinal regions LUNGS: clear to auscultation b/l with normal respiratory effort HEART: regular rate & rhythm ABDOMEN:  normoactive bowel sounds , non tender, not distended. Extremity: no pedal edema PSYCH: alert & oriented x 3 with fluent speech NEURO: no focal motor/sensory deficits  LABORATORY DATA:  I have reviewed the data as listed  . CBC Latest Ref Rng & Units 11/29/2017 11/28/2017 11/21/2017  WBC 3.9 - 10.3 K/uL 11.8(H) 11.8(H) 9.4  Hemoglobin 12.0 - 15.0 g/dL - 10.2(L) 9.2(L)  Hematocrit 34.8 - 46.6 % 35.7 33.5(L) 30.4(L)    Platelets 145 - 400 K/uL 273 291.0 183  hgb 10.7  . CMP Latest Ref Rng & Units 11/28/2017 11/21/2017 11/20/2017  Glucose 70 - 99 mg/dL 84 170(H) 131(H)  BUN 6 - 23 mg/dL 16 16 26(H)  Creatinine 0.40 - 1.20 mg/dL 1.37(H) 1.32(H) 1.37(H)  Sodium 135 - 145 mEq/L 147(H) 143 144  Potassium 3.5 - 5.1 mEq/L 4.2 3.7 3.0(L)  Chloride 96 - 112 mEq/L 108 105 105  CO2 19 - 32 mEq/L _0 Calcium 8.4 - 10.5 mg/dL 8.0(L) 8.4(L) 8.8(L)  Total Protein 6.0 - 8.3 g/dL 6.3 5.2(L) 5.3(L)  Total Bilirubin 0.2 - 1.2 mg/dL 0.6 0.9 1.3(H)  Alkaline Phos 39 - 117 U/L 83 64 65  AST 0 - 37 U/L 11 10(L) 10(L)  ALT 0 - 35 U/L 9 7(L) 7(L)   Component     Latest Ref Rng & Units 11/29/2017  Iron     41 - 142 ug/dL 55  TIBC     236 - 444 ug/dL 294  Saturation Ratios     21 - 57 % 19 (L)  UIBC     ug/dL 239  Retic Ct Pct     0.7 - 2.1 % 2.1  RBC.     3.70 - 5.45 MIL/uL 4.23  Retic Count, Absolute     33.7 - 90.7 K/uL 88.8  Ferritin     9 - 269 ng/mL 308 (H)  LDH     125 - 245 U/L 222  Parietal Cell Antibody-IgG     0.0 - 20.0 Units 2.0  Intrinsic Factor     0.0 - 1.1 AU/mL 1.1     RADIOGRAPHIC STUDIES: I have personally reviewed the radiological images as listed and agreed with the findings in the report. Ct Abdomen Pelvis Wo Contrast  Result Date: 11/17/2017 CLINICAL DATA:  Right upper quadrant pain. EXAM: CT ABDOMEN AND PELVIS WITHOUT CONTRAST TECHNIQUE: Multidetector CT imaging of the abdomen and pelvis was performed following the standard protocol without IV contrast. COMPARISON:  None. FINDINGS: Lower chest: Innumerable pulmonary nodules in the lung bases, measuring up to 8 mm. Hepatobiliary: No focal liver abnormality. Cholelithiasis. The gallbladder is mildly distended. No gallbladder wall thickening or biliary dilatation. Pancreas: Unremarkable. No pancreatic ductal dilatation or surrounding inflammatory changes. Spleen: Normal in size without focal abnormality. Adrenals/Urinary Tract: The  adrenal glands are unremarkable. Mild bilateral perinephric fat stranding, nonspecific. No focal renal lesion. No renal or ureteral calculi. No hydronephrosis. The bladder is unremarkable. Stomach/Bowel: Stomach is within normal limits. Appendix appears normal. No evidence of bowel wall thickening, distention, or inflammatory changes. Vascular/Lymphatic: Aortic atherosclerosis. Prominent, borderline enlarged retroperitoneal lymph nodes measuring up to 9 mm in short axis. Enlarged bilateral pelvic sidewall lymph nodes, measuring up to 2.2 cm in short axis. Enlarged right external iliac lymph node measuring up to 1.7 cm in short axis. Reproductive: Status post hysterectomy. No adnexal masses. Other: No free fluid or pneumoperitoneum. Musculoskeletal: No acute or significant osseous findings. Severe degenerative changes of the thoracolumbar spine. Grade 1 anterolisthesis at L4-L5. IMPRESSION: 1. Pelvic lymphadenopathy and innumerable pulmonary nodules in the lung bases, consistent with metastatic disease. 2. Cholelithiasis without CT evidence of acute cholecystitis. 3.  Aortic atherosclerosis (ICD10-I70.0). Electronically Signed   By: Titus Dubin M.D.   On: 11/17/2017 20:41   Ct Chest W Contrast  Result Date: 11/20/2017 CLINICAL DATA:  67 year old female with history of pulmonary nodule. Followup study. EXAM: CT CHEST WITH CONTRAST TECHNIQUE: Multidetector CT imaging of the chest was performed during intravenous contrast administration. CONTRAST:  53m ISOVUE-300 IOPAMIDOL (ISOVUE-300) INJECTION 61% COMPARISON:  Coronary calcium score CT scan 01/13/2016. CT the abdomen and pelvis 11/17/2017. FINDINGS: Cardiovascular: Heart size is mildly enlarged. There is no significant pericardial fluid, thickening or pericardial calcification. There is aortic atherosclerosis, as well as atherosclerosis of the great vessels of the mediastinum and the coronary arteries, including calcified atherosclerotic plaque in the left  main, left anterior descending, left circumflex and right coronary arteries. Mediastinum/Nodes: Multiple borderline enlarged mediastinal lymph nodes measuring up to 11 mm in short axis in the high right paratracheal nodal station. Esophagus is unremarkable in appearance. Multiple prominent borderline enlarged axillary lymph nodes bilaterally measuring up to 9 mm in short axis on the left. Mildly enlarged right subpectoral lymph node measuring 1.4 cm in short axis. Lungs/Pleura: Again noted are numerous small pulmonary nodules scattered throughout the lungs bilaterally. When compared to prior cardiac CT 01/13/2016, all previously noted pulmonary nodules are stable in size and number. The largest of these nodules is in the left lower lobe (axial image 89 of series 4) measuring 9 x 8 mm. In the upper lungs which were not visualized on the prior chest CT there also several small pulmonary nodules which measure 3 mm or less in size. No larger more suspicious appearing pulmonary nodules or masses. No acute consolidative airspace disease. Trace right pleural effusion lying dependently. No left pleural effusion. Mild scarring and atelectasis noted in the medial aspect of the right lower lobe. Upper Abdomen: Calcified gallstones lying dependently in the gallbladder measuring up to 1.5 cm in diameter. Aortic atherosclerosis. Musculoskeletal: There are no aggressive appearing lytic or blastic lesions noted in the visualized portions of the skeleton. IMPRESSION: 1. While there are multiple small pulmonary nodules scattered throughout the lungs bilaterally measuring up to 8 x 9 mm in  the left lower lobe, these appear unchanged when compared to prior cardiac CT 01/13/2016, and are considered benign. In the upper lungs (which were not imaged on the prior examination, there are several small 1-3 mm pulmonary nodules which are also strongly favored to be benign. No follow-up needed if patient is low-risk (and has no known or  suspected primary neoplasm). Non-contrast chest CT can be considered in 12 months if patient is high-risk. This recommendation follows the consensus statement: Guidelines for Management of Incidental Pulmonary Nodules Detected on CT Images: From the Fleischner Society 2017; Radiology 2017; 284:228-243. 2. Several borderline enlarged and mildly enlarged mediastinal and bilateral axillary/subpectoral lymph nodes. Given the lymphadenopathy in the pelvis, clinical correlation for signs and symptoms of lymphoproliferative disorder is suggested. 3. Aortic atherosclerosis, in addition to left main and 3 vessel coronary artery disease. Please note that although the presence of coronary artery calcium documents the presence of coronary artery disease, the severity of this disease and any potential stenosis cannot be assessed on this non-gated CT examination. Assessment for potential risk factor modification, dietary therapy or pharmacologic therapy may be warranted, if clinically indicated. 4. Trace right pleural effusion lying dependently. Aortic Atherosclerosis (ICD10-I70.0). Electronically Signed   By: Vinnie Langton M.D.   On: 11/20/2017 12:01    ASSESSMENT & PLAN:  Brenda [name] is a 67 y.o. caucasian female with   1. 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 Lab Results  Component Value Date   LDH 222 11/29/2017    2. Multiple pulmonary nodules   CT AP on 11/17/17 - with 1. Pelvic lymphadenopathy and innumerable pulmonary nodules in the lung bases, consistent with metastatic disease. 2. Cholelithiasis without CT evidence of acute cholecystitis. 3.  Aortic atherosclerosis  CT Chest on 11/20/17 - with multiple small lung nodules in LLL that appear stable from prior CT in 12/2015 and are considered benign and Upper lung nodules also appear  benign. Also with several borderline enlarged lymph nodes concerning for a lymphoproliferative disorder   3. 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  -I discussed getting a PET scan to rule out primary tumor, lymph node activity uptake or other enlarged lymph nodes useful for biopsy.  -I discussed the likelihood of a biopsy to find cause of enlarged lymph nodes.  -If she is still having diarrhea today we can do a stool test to rule out infection, if not we will monitor.  --flowcytometry results concerning for CLL (most likely) vs Mantle cell lymphoma (less likely)  -will need to check t(11;14)/Cyclin D on peripheral blood to determine this. LN if any possible based on PET/CT  4. Microcytic anemia - Fe deficiency  Presented in hospital with H of 6.6 Hgb holding steady s/p 2U PRBC - Fe infusioncompleted 1/19- stool hemoccult negative - suspect this may be due to CKD, but iron deficiency also worrisome for occult GIB  -Tolerated IV iron well  . Lab Results  Component Value Date   IRON 55 11/29/2017   TIBC 294 11/29/2017   IRONPCTSAT 19 (L) 11/29/2017   (Iron and  TIBC)  Lab Results  Component Value Date   FERRITIN 308 (H) 11/29/2017   hgb improved to 10.7  5. Marginal B12 levels in hospital  (Antiparietal celll and anti IF Ab negative) -given one B12 injection in hospital  PLAN  -reviewed 11/29/17 labs which showed hg improved to 10.7. Cr levels much improved to 1.37 -I recommend oral sublingual B12 to help her levels.  -Last colonoscopy was 2011 and normal- I recommend she set up GI workup with [facility].    Labs today PET/CT in a week RTC with Dr  Irene Limbo in 2 weeks   All of the patients questions were answered with apparent satisfaction. The patient knows to call the clinic with any problems, questions or concerns.  I spent 45 minutes counseling the patient face to face. The total time spent in the appointment was 60 minutes and more than 50% was on counseling and direct patient cares.    Sullivan Lone MD MS AAHIVMS [facility] Hematology/Oncology Physician [facility]  (Office):       [phone] (Work cell):  [phone] (Fax):           [phone]  11/29/2017 1:54 PM  This document serves as a record of services personally performed by Sullivan Lone, MD. It was created on his behalf by Joslyn Devon, a trained medical scribe. The creation of this record is based on the scribe's personal observations and the provider's statements to them.    .I have reviewed the above documentation for accuracy and completeness, and I agree with the above. Brunetta Genera MD

## 2017-11-29 NOTE — Telephone Encounter (Signed)
Gave avs and calendar february

## 2017-11-30 LAB — IRON AND TIBC
IRON: 55 ug/dL (ref 41–142)
Saturation Ratios: 19 % — ABNORMAL LOW (ref 21–57)
TIBC: 294 ug/dL (ref 236–444)
UIBC: 239 ug/dL

## 2017-11-30 LAB — FERRITIN: Ferritin: 308 ng/mL — ABNORMAL HIGH (ref 9–269)

## 2017-11-30 LAB — INTRINSIC FACTOR ANTIBODIES: INTRINSIC FACTOR: 1.1 [AU]/ml (ref 0.0–1.1)

## 2017-11-30 LAB — ANTI-PARIETAL ANTIBODY: PARIETAL CELL ANTIBODY-IGG: 2 U (ref 0.0–20.0)

## 2017-12-01 ENCOUNTER — Telehealth: Payer: Self-pay

## 2017-12-01 NOTE — Telephone Encounter (Signed)
Received call back from pt. Confirmed that she will be able to have PET scan on 2/9 at 0830. Gave pt additional directions to Radiology upon request. Pt thanks for the information.

## 2017-12-01 NOTE — Telephone Encounter (Signed)
Pt scheduled for PET on 12/10/17 at 0830. Arrival time 0800. NPO after midnight and hold morning dose of insulin. Left VM with this information and that there was also a 1600 spot on 12/09/17 if the pt preferred. Waiting on patient's response to confirm date and time of PET.

## 2017-12-08 ENCOUNTER — Other Ambulatory Visit: Payer: Self-pay | Admitting: Internal Medicine

## 2017-12-08 DIAGNOSIS — M25552 Pain in left hip: Secondary | ICD-10-CM | POA: Diagnosis not present

## 2017-12-08 MED FILL — SIMVASTATIN 40 MG TABLET: 40 | 90 days supply | Qty: 90 | Fill #1

## 2017-12-08 MED FILL — METFORMIN HCL ER 500 MG TAB: 500 | 90 days supply | Qty: 360 | Fill #0

## 2017-12-09 LAB — FLOW CYTOMETRY

## 2017-12-10 ENCOUNTER — Ambulatory Visit (HOSPITAL_COMMUNITY)
Admission: RE | Admit: 2017-12-10 | Discharge: 2017-12-10 | Disposition: A | Discharge status: Home / Self Care | Payer: Medicare Other | Source: Ambulatory Visit | Attending: Hematology | Admitting: Hematology

## 2017-12-10 DIAGNOSIS — R590 Localized enlarged lymph nodes: Secondary | ICD-10-CM | POA: Insufficient documentation

## 2017-12-10 DIAGNOSIS — C8590 Non-Hodgkin lymphoma, unspecified, unspecified site: Secondary | ICD-10-CM | POA: Diagnosis not present

## 2017-12-10 DIAGNOSIS — Z794 Long term (current) use of insulin: Secondary | ICD-10-CM | POA: Diagnosis not present

## 2017-12-10 DIAGNOSIS — R591 Generalized enlarged lymph nodes: Secondary | ICD-10-CM

## 2017-12-10 DIAGNOSIS — C801 Malignant (primary) neoplasm, unspecified: Secondary | ICD-10-CM | POA: Insufficient documentation

## 2017-12-10 DIAGNOSIS — R918 Other nonspecific abnormal finding of lung field: Secondary | ICD-10-CM | POA: Insufficient documentation

## 2017-12-10 DIAGNOSIS — J90 Pleural effusion, not elsewhere classified: Secondary | ICD-10-CM | POA: Insufficient documentation

## 2017-12-10 LAB — GLUCOSE, CAPILLARY: Glucose-Capillary: 60 mg/dL — ABNORMAL LOW (ref 65–99)

## 2017-12-10 MED ORDER — FLUDEOXYGLUCOSE F - 18 (FDG) INJECTION: 8.4000 | Freq: Once | INTRAVENOUS | Status: DC | PRN

## 2017-12-14 NOTE — Progress Notes (Signed)
HEMATOLOGY/ONCOLOGY CONSULTATION NOTE  Date of Service: 12/15/2017  Patient Care Team: Lucille Passy, MD as PCP - General Rockey Situ Kathlene November, MD as Consulting Physician (Cardiology)  CHIEF COMPLAINTS/PURPOSE OF CONSULTATION:  F/u for newly diagnosed CD5 positive lymphoproliferative disorder.  HISTORY OF PRESENTING ILLNESS:   Brenda [name] is a wonderful 67 y.o. female who was hospitalized on 11/17/17 for abdominal discomfort, nausea and anorexia.   Further workup showed elevated kidney function tests and CT of the abd and pelvis demonstrated pelvic lymphadenopathy and innumerable pulmonary nodulesin the bases concerning for metastases.  Today she is here for an outpatient follow up accompanied by her friend. She notes she has not felt her usual self in the past 2 months with fatigue and lack of appetite. She also noticed diarrhea that comes and goes and her energy level would continue to drop. She has lost 25-30 pounds since end of summer 2018. She will notice mucous with her stool but no bloody or black stool. She does not take any meds that she would associate with her diarrhea or lower appetite and she denies change in her chronic medications. She has been on Nexium for a long period of time. She notes she does not often get sick. Dr. Deborra Medina is her PCP. She was using 215m ibuprofen 2-3 times a day more often recently due to her hip pain.   She notes her mother had breast cancer and diagnosed at 51yo. Her father had a rare form of cancer who died 3 weeks after diagnosis in his 86s Her brother passed from non-alcohol liver cirrhosis.   On review of symptoms, pt notes since her discharge she no longer has abdominal pain but will still have occasional diarrhea. She has not had diarrhea in several days. She notes to having a slight cough but mostly sinus drainage. Her appetite and eating has improved. She notes due to her knee replacement and hip fracture she will have swelling in her  left leg. She has lower back pain at times. She denies feeling lumps or bumps in her axilla b/l or groin. She denies dysuria or change in her urination.   Interval History:  Brenda SAFRANreturns today regarding her newly diagnosed Chronic Lymphocytic Leukemia. The patient's last visit with [facility] on 11/29/17. She is accompanied today by her friend. The pt reports that she is doing well overall. She notes that she has been taken off Lisinopril and HCTZ recently. She notes only taking the Metoprolol 25 once per day.  She is not taking B12 supplements nor PO iron. She notes having taken Metformin for several years.She notes she was taking amlodipine until several months ago, switching to Lisinopril. She notes that she has not had a GI w/u to figure out a possible cause of her Iron Deficient Anemia.  She notes that she has had trouble sleeping for years but has never looked into a sleep study to evaluate herself for sleep apnea.  She notes problems with her hip in the last several months after falling on it.  Of note since the patient last visit, pt has had PET scan completed on 12/10/17 with results revealing 1. Mild cervical, axillary, mediastinal and periaortic retroperitoneal adenopathy with very low metabolic activity (less than blood pool). Findings most consistent with chronic lymphocytic leukemia. 2. Largest lymph node is a RIGHT external iliac lymph node measuring 2.7 cm short axis. This node also has low metabolic activity equal to blood pool and consistent with CLL.  3. No evidence skeletal metastasis or splenic involvement. 4. Small bilateral pleural effusions new from CT 11/20/2017. 5. Stable small bilateral pulmonary nodules compared with CT 01/13/2016.  Lab results (11/29/17) of CBC, CMP, and Reticulocytes is as follows: all values are WNL except for WBC at 11.8k, Hgb at 10.7, MCHC at 30.0, RDW at 19.7, Neutro Abs at 6.8k, Lymph Abs at 4.0k, Chloride at 111, Glucose at 64, Creatinine at  1.60, Calcium at 8.0, Total Protein at 6.3. Ferritin at 308 on 11/29/17. LDH at 222 on 11/29/17.  Parietal Cell Antibody-IgG at 2.0. Intrinsic Factor Antibodies at 1.1.   On review of systems, pt reports no new symptoms and denies fevers, chills, night sweats, large weight loss.     MEDICAL HISTORY:  Past Medical History:  Diagnosis Date  . Anxiety   . Arthritis   . Depression    Wellbutrin  . Diabetes mellitus    Type II  . Endometrial ca ([place])    1998  . 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 GERD (gastroesophageal reflux disease)   . Heart murmur    patient states "cardiologist has not heard heart murmur for past several years"  . Heel spur   . Hyperlipidemia   . Hypertension   . Left leg swelling    "swelling in left leg from knee down when sitting for prolonged periods or being on feet for a long period of time"  . PONV (postoperative nausea and vomiting)    after hysterectomy  . Renal insufficiency     SURGICAL HISTORY: Past Surgical History:  Procedure Laterality Date  . ABDOMINAL HYSTERECTOMY  1998  . COLONOSCOPY    . EYE SURGERY Bilateral    cataracts  . EYE SURGERY Left    retina tear  . FEMUR IM NAIL  10/25/2012   Procedure: INTRAMEDULLARY (IM) NAIL FEMORAL;  Surgeon: Mauri Pole, MD;  Location: [facility];  Service: Orthopedics;  Laterality: Left;  . HIP SURGERY    . left knee arthroscopy  12/2010  . REFRACTIVE SURGERY    . TOTAL KNEE ARTHROPLASTY  12/20/2011   Procedure: TOTAL KNEE ARTHROPLASTY;  Surgeon: Gearlean Alf, MD;  Location: [facility];  Service: Orthopedics;  Laterality: Left;  Failed attempt at spinal   . TOTAL SHOULDER ARTHROPLASTY Right 08/19/2016   Procedure: RIGHT TOTAL SHOULDER ARTHROPLASTY;  Surgeon: Justice Britain, MD;  Location: [facility];  Service: Orthopedics;  Laterality: Right;    SOCIAL HISTORY: Social History   Socioeconomic History  . Marital status: Single    Spouse name: Not on file  .  Number of children: Not on file  . Years of education: Not on file  . Highest education level: Not on file  Social Needs  . Financial resource strain: Not on file  . Food insecurity - worry: Not on file  . Food insecurity - inability: Not on file  . Transportation needs - medical: Not on file  . Transportation needs - non-medical: Not on file  Occupational History  . Occupation: Therapist, sports at [employer]: [employer]  Tobacco Use  . Smoking status: Never Smoker  . Smokeless tobacco: Never Used  Substance and Sexual Activity  . Alcohol use: No  . Drug use: No  . Sexual activity: Not on file  Other Topics Concern  . 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 Cancer Mother        breast cancer  . Cancer Father        plasma sarcoma  . Breast cancer Neg Hx     ALLERGIES:  is allergic to gabapentin.  MEDICATIONS:  Current Outpatient Medications  Medication Sig Dispense Refill  . aspirin 81 MG tablet Take 81 mg by mouth daily.    . Blood Glucose Monitoring Suppl (FREESTYLE FREEDOM LITE) w/Device KIT Use to check blood sugar twice a day, E11.65 1 each 1  . buPROPion (WELLBUTRIN XL) 300 MG 24 hr tablet Take 1 tablet (300 mg total) by mouth daily. 90 tablet 3  . Calcium-Magnesium-Vitamin D (CALCIUM 500 PO) Take by mouth 2 (two) times daily.    . cholecalciferol (VITAMIN D) 1000 UNITS tablet Take 2,000 Units by mouth daily.     . clonazePAM (KLONOPIN) 0.5 MG tablet TAKE 1 TABLET BY MOUTH 3 TIMES DAILY AS NEEDED FOR ANXIETY 90 tablet 3  . Esomeprazole Magnesium (NEXIUM PO) Take 22.5 mg by mouth daily.    . furosemide (LASIX) 20 MG tablet Take 1 tablet (20 mg total) by mouth daily as needed. 90 tablet 3  . glipiZIDE (GLUCOTROL XL) 2.5 MG 24 hr tablet Take 1 tablet (2.5 mg total) by mouth 2 (two) times daily before a meal. 180 tablet 3  . glucose blood (FREESTYLE LITE) test  strip Use to check blood sugar twice a day, E11.65 200 each 12  . Lancets (FREESTYLE) lancets Use to check blood sugar twice a day, E11.65 200 each 12  . liraglutide (VICTOZA) 18 MG/3ML SOPN Inject 0.3 mLs (1.8 mg total) into the skin daily. 27 mL 3  . metFORMIN (GLUCOPHAGE-XR) 500 MG 24 hr tablet TAKE 2 TABLETS (1,000 MG TOTAL) BY MOUTH 2 (TWO) TIMES DAILY WITH A MEAL. 360 tablet 0  . metoprolol succinate (TOPROL-XL) 25 MG 24 hr tablet TAKE 1 TABLET (25 MG TOTAL) BY MOUTH DAILY. 90 tablet 1  . Multiple Vitamins-Minerals (HM MULTIVITAMIN ADULT GUMMY PO) Take 1 tablet by mouth daily.    . simvastatin (ZOCOR) 40 MG tablet TAKE 1 TABLET BY MOUTH ONCE DAILY AT BEDTIME 90 tablet 3  . UNIFINE PENTIPS 31G X 8 MM MISC USE AS DIRECTED WITH VICTOZA 100 each 88   No current facility-administered medications for this visit.    Facility-Administered Medications Ordered in Other Visits  Medication Dose Route Frequency Provider Last Rate Last Dose  . fludeoxyglucose F - 18 (FDG) injection 8.4 millicurie  8.4 millicurie Intravenous Once PRN Gus Height, MD        REVIEW OF SYSTEMS:   .10 Point review of Systems was done is negative except as noted above.   PHYSICAL EXAMINATION: ECOG PERFORMANCE STATUS: 1 - Symptomatic but completely ambulatory  . Vitals:   12/15/17 1410  BP: (!) 201/94  Pulse: 83  Resp: 18  Temp: 98.1 F (36.7 C)  SpO2: 96%   Filed Weights   12/15/17 1410  Weight: 181 lb 11.2 oz (82.4 kg)   .Body mass index is 35.49 kg/m.  Marland Kitchen GENERAL:alert, in no acute distress and comfortable SKIN: no acute rashes, no significant lesions EYES: conjunctiva are pink and non-injected, sclera anicteric OROPHARYNX: MMM, no exudates, no oropharyngeal erythema or ulceration NECK: supple, no JVD LYMPH:  no palpable lymphadenopathy in the cervical, axillary or inguinal regions LUNGS: clear to auscultation b/l with normal respiratory effort HEART: regular rate & rhythm ABDOMEN:  normoactive  bowel sounds , non tender, not distended. Extremity: no pedal edema PSYCH: alert & oriented x 3 with fluent speech NEURO: no focal motor/sensory deficits    LABORATORY DATA:  I have reviewed the data as listed  . CBC Latest Ref Rng & Units 11/29/2017 11/28/2017 11/21/2017  WBC 3.9 - 10.3 K/uL 11.8(H) 11.8(H) 9.4  Hemoglobin 12.0 - 15.0 g/dL - 10.2(L) 9.2(L)  Hematocrit 34.8 - 46.6 % 35.7 33.5(L) 30.4(L)  Platelets 145 - 400 K/uL 273 291.0 183  hgb 10.7  .Marland Kitchen CBC    Component Value Date/Time   WBC 11.8 (H) 11/29/2017 1412   WBC 11.8 (H) 11/28/2017 1127   RBC 4.23 11/29/2017 1412   RBC 4.23 11/29/2017 1412   HGB 10.2 (L) 11/28/2017 1127   HCT 35.7 11/29/2017 1412   PLT 273 11/29/2017 1412   MCV 84.4 11/29/2017 1412   MCH 25.3 11/29/2017 1412   MCHC 30.0 (L) 11/29/2017 1412   RDW 19.7 (H) 11/29/2017 1412   LYMPHSABS 4.0 (H) 11/29/2017 1412   MONOABS 0.7 11/29/2017 1412   EOSABS 0.3 11/29/2017 1412   BASOSABS 0.0 11/29/2017 1412    CMP Latest Ref Rng & Units 11/29/2017 11/28/2017 11/21/2017  Glucose 70 - 140 mg/dL 64(L) 84 170(H)  BUN 7 - 26 mg/dL 17 16 16   Creatinine 0.60 - 1.10 mg/dL 1.60(H) 1.37(H) 1.32(H)  Sodium 136 - 145 mmol/L 145 147(H) 143  Potassium 3.3 - 4.7 mmol/L 3.7 4.2 3.7  Chloride 98 - 109 mmol/L 111(H) 108 105  CO2 22 - 29 mmol/L 23 25 28   Calcium 8.4 - 10.4 mg/dL 8.0(L) 8.0(L) 8.4(L)  Total Protein 6.4 - 8.3 g/dL 6.3(L) 6.3 5.2(L)  Total Bilirubin 0.2 - 1.2 mg/dL 0.4 0.6 0.9  Alkaline Phos 40 - 150 U/L 99 83 64  AST 5 - 34 U/L 11 11 10(L)  ALT 0 - 55 U/L 8 9 7(L)   Component     Latest Ref Rng & Units 11/29/2017  Iron     41 - 142 ug/dL 55  TIBC     236 - 444 ug/dL 294  Saturation Ratios     21 - 57 % 19 (L)  UIBC     ug/dL 239  Retic Ct Pct     0.7 - 2.1 % 2.1  RBC.     3.70 - 5.45 MIL/uL 4.23  Retic Count, Absolute     33.7 - 90.7 K/uL 88.8  Ferritin     9 - 269 ng/mL 308 (H)  LDH     125 - 245 U/L 222  Parietal Cell Antibody-IgG      0.0 - 20.0 Units 2.0  Intrinsic Factor     0.0 - 1.1 AU/mL 1.1     RADIOGRAPHIC STUDIES: I have personally reviewed the radiological images as listed and agreed with the findings in the report. Ct Abdomen Pelvis Wo Contrast  Result Date: 11/17/2017 CLINICAL DATA:  Right upper quadrant pain. EXAM: CT ABDOMEN AND PELVIS WITHOUT CONTRAST TECHNIQUE: Multidetector CT imaging of the abdomen and pelvis was performed following the standard protocol without IV contrast. COMPARISON:  None. FINDINGS: Lower chest: Innumerable pulmonary nodules in the lung bases, measuring up to 8 mm. Hepatobiliary: No focal liver abnormality. Cholelithiasis. The gallbladder is mildly distended. No gallbladder wall thickening or biliary dilatation. Pancreas: Unremarkable. No pancreatic ductal dilatation or surrounding inflammatory changes. Spleen: Normal in size without focal abnormality. Adrenals/Urinary Tract: The adrenal glands are unremarkable. Mild bilateral perinephric fat stranding, nonspecific. No  focal renal lesion. No renal or ureteral calculi. No hydronephrosis. The bladder is unremarkable. Stomach/Bowel: Stomach is within normal limits. Appendix appears normal. No evidence of bowel wall thickening, distention, or inflammatory changes. Vascular/Lymphatic: Aortic atherosclerosis. Prominent, borderline enlarged retroperitoneal lymph nodes measuring up to 9 mm in short axis. Enlarged bilateral pelvic sidewall lymph nodes, measuring up to 2.2 cm in short axis. Enlarged right external iliac lymph node measuring up to 1.7 cm in short axis. Reproductive: Status post hysterectomy. No adnexal masses. Other: No free fluid or pneumoperitoneum. Musculoskeletal: No acute or significant osseous findings. Severe degenerative changes of the thoracolumbar spine. Grade 1 anterolisthesis at L4-L5. IMPRESSION: 1. Pelvic lymphadenopathy and innumerable pulmonary nodules in the lung bases, consistent with metastatic disease. 2. Cholelithiasis  without CT evidence of acute cholecystitis. 3.  Aortic atherosclerosis (ICD10-I70.0). Electronically Signed   By: Titus Dubin M.D.   On: 11/17/2017 20:41   Ct Chest W Contrast  Result Date: 11/20/2017 CLINICAL DATA:  66 year old female with history of pulmonary nodule. Followup study. EXAM: CT CHEST WITH CONTRAST TECHNIQUE: Multidetector CT imaging of the chest was performed during intravenous contrast administration. CONTRAST:  20m ISOVUE-300 IOPAMIDOL (ISOVUE-300) INJECTION 61% COMPARISON:  Coronary calcium score CT scan 01/13/2016. CT the abdomen and pelvis 11/17/2017. FINDINGS: Cardiovascular: Heart size is mildly enlarged. There is no significant pericardial fluid, thickening or pericardial calcification. There is aortic atherosclerosis, as well as atherosclerosis of the great vessels of the mediastinum and the coronary arteries, including calcified atherosclerotic plaque in the left main, left anterior descending, left circumflex and right coronary arteries. Mediastinum/Nodes: Multiple borderline enlarged mediastinal lymph nodes measuring up to 11 mm in short axis in the high right paratracheal nodal station. Esophagus is unremarkable in appearance. Multiple prominent borderline enlarged axillary lymph nodes bilaterally measuring up to 9 mm in short axis on the left. Mildly enlarged right subpectoral lymph node measuring 1.4 cm in short axis. Lungs/Pleura: Again noted are numerous small pulmonary nodules scattered throughout the lungs bilaterally. When compared to prior cardiac CT 01/13/2016, all previously noted pulmonary nodules are stable in size and number. The largest of these nodules is in the left lower lobe (axial image 89 of series 4) measuring 9 x 8 mm. In the upper lungs which were not visualized on the prior chest CT there also several small pulmonary nodules which measure 3 mm or less in size. No larger more suspicious appearing pulmonary nodules or masses. No acute consolidative airspace  disease. Trace right pleural effusion lying dependently. No left pleural effusion. Mild scarring and atelectasis noted in the medial aspect of the right lower lobe. Upper Abdomen: Calcified gallstones lying dependently in the gallbladder measuring up to 1.5 cm in diameter. Aortic atherosclerosis. Musculoskeletal: There are no aggressive appearing lytic or blastic lesions noted in the visualized portions of the skeleton. IMPRESSION: 1. While there are multiple small pulmonary nodules scattered throughout the lungs bilaterally measuring up to 8 x 9 mm in the left lower lobe, these appear unchanged when compared to prior cardiac CT 01/13/2016, and are considered benign. In the upper lungs (which were not imaged on the prior examination, there are several small 1-3 mm pulmonary nodules which are also strongly favored to be benign. No follow-up needed if patient is low-risk (and has no known or suspected primary neoplasm). Non-contrast chest CT can be considered in 12 months if patient is high-risk. This recommendation follows the consensus statement: Guidelines for Management of Incidental Pulmonary Nodules Detected on CT Images: From the Fleischner Society 2017;  Radiology 2017; 284:228-243. 2. Several borderline enlarged and mildly enlarged mediastinal and bilateral axillary/subpectoral lymph nodes. Given the lymphadenopathy in the pelvis, clinical correlation for signs and symptoms of lymphoproliferative disorder is suggested. 3. Aortic atherosclerosis, in addition to left main and 3 vessel coronary artery disease. Please note that although the presence of coronary artery calcium documents the presence of coronary artery disease, the severity of this disease and any potential stenosis cannot be assessed on this non-gated CT examination. Assessment for potential risk factor modification, dietary therapy or pharmacologic therapy may be warranted, if clinically indicated. 4. Trace right pleural effusion lying  dependently. Aortic Atherosclerosis (ICD10-I70.0). Electronically Signed   By: Vinnie Langton M.D.   On: 11/20/2017 12:01   Nm Pet Image Initial (pi) Skull Base To Thigh  Result Date: 12/10/2017 CLINICAL DATA:  Initial treatment strategy for lymphoma. EXAM: NUCLEAR MEDICINE PET SKULL BASE TO THIGH TECHNIQUE: 8.4 mCi F-18 FDG was injected intravenously. Full-ring PET imaging was performed from the skull base to thigh after the radiotracer. CT data was obtained and used for attenuation correction and anatomic localization. FASTING BLOOD GLUCOSE:  Value: 60 mg/dl COMPARISON:  CT 117 19, [id number] FINDINGS: NECK 11 mm LEFT level 3 lymph node without associated metabolic activity. CHEST Bilateral mildly prominent axial lymph nodes. For example 11 mm RIGHT axial lymph node (image 54, series 4) with no significant metabolic activity (less than blood pool). Mild mediastinal adenopathy also without significant metabolic activity Small pericardial effusion is present. Bilateral small pleural effusions. There multiple lower lobe pulmonary nodules do not have radiotracer activity. No significant changed from CT 01/13/2016 ABDOMEN/PELVIS Spleen is normal size and normal metabolic activity. There are several LEFT periaortic lymph nodes which are enlarged. For example 10 mm LEFT periaortic lymph node on image 123, series 4 with SUV max equal 125 (less than blood pool). The largest lymph node is a LEFT external iliac lymph node which measures 2.7 cm short axis (image 163, series 4) with SUV max equal 2.7 equal to blood pool. Spleen is normal size and normal metabolic activity. No abnormal metabolic activity liver. Post hysterectomy anatomy SKELETON Several healed fractures of the anteromedial LEFT lower ribs. No evidence of marrow involvement. IMPRESSION: 1. Mild cervical, axillary, mediastinal and periaortic retroperitoneal adenopathy with very low metabolic activity (less than blood pool). Findings most consistent with  chronic lymphocytic leukemia. 2. Largest lymph node is a RIGHT external iliac lymph node measuring 2.7 cm short axis. This node also has low metabolic activity equal to blood pool and consistent with CLL. 3. No evidence skeletal metastasis or splenic involvement. 4. Small bilateral pleural effusions new from CT 11/20/2017. 5. Stable small bilateral pulmonary nodules compared with CT 01/13/2016. Electronically Signed   By: Suzy Bouchard M.D.   On: 12/10/2017 16:30    ASSESSMENT & PLAN:  Brenda [name] is a 67 y.o. caucasian female with   1. 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 Lab Results  Component Value Date   LDH 222 11/29/2017    2. Multiple pulmonary nodules   CT AP on 11/17/17 - with 1. Pelvic lymphadenopathy and innumerable pulmonary nodules in the lung bases, consistent with metastatic disease. 2. Cholelithiasis without CT evidence of acute cholecystitis. 3.  Aortic atherosclerosis  CT Chest on 11/20/17 - with multiple small lung nodules in LLL that appear stable from prior CT in  12/2015 and are considered benign and Upper lung nodules also appear benign. Also with several borderline enlarged lymph nodes concerning for a lymphoproliferative disorder   3. H/o endometrial cancer in 1998 -localized to uterus -treated with surgery, abdominal hysterectomy  -no evidence of recurrence  PLAN:   -I reviewed her CT scans and discussed with her concerning borderline enlarged lymph nodes and overall benign multiple lung nodules -flowcytometry results concerning for CLL (most likely) vs Mantle cell lymphoma (less likely) -will need to check t(11;14)/Cyclin D on peripheral blood to determine this.  - if proven to be CLL - patient would not currently has an clear indication for treatment at this time.  4. Microcytic anemia -  Fe deficiency  Presented in hospital with Hgb of 6.6 Hgb holding steady s/p 2U PRBC - Fe infusioncompleted 1/19- stool hemoccult negative - suspect this may be due to CKD, but iron deficiency also worrisome for occult GIB  -Tolerated IV iron well  . Lab Results  Component Value Date   IRON 55 11/29/2017   TIBC 294 11/29/2017   IRONPCTSAT 19 (L) 11/29/2017   (Iron and TIBC)  Lab Results  Component Value Date   FERRITIN 308 (H) 11/29/2017    5. Marginal B12 levels in hospital  (Antiparietal celll and anti IF Ab negative) -given one B12 injection in hospital  PLAN  -reviewed 11/29/17 labs which showed Hgb  improved to 10.7. Cr levels much improved to 1.37 -I recommend oral sublingual B12 to help her levels.  -Last colonoscopy was 2011 and normal- I recommend she set up GI workup with [facility].  -Discussed pt labwork today and her most recent PET scan results which did not indicate a common source for the clonal population.  -Discussed her higher lymphocyte number, and that her signature is most consistent with Chronic Lymphocytic Leukemia unaccompanied by large amount of lymphocytes: Lymph Abs at 4.0k. Presenting with small lymph nodes.  -Biopsy at this point is probably not warranted; if genetic testing reveals Mantel Cell lymphoma then we will reconsider this.  -CD5 population identified -Discussed the constitutional symptoms that we will look for. Wait and watch regarding her diagnosis with occasional scans and routine blood work.  -We are awaiting the [place] study's results to prognostic re her CLL and r/o Mantel Cell Lymphoma.  -We noted that her anemia is likely not connected with her CLL given her history of iron-deficiency anemia.  -We advised Vitamin B12 1071m daily under the tongue supplements and OTC ironpolysaccharide 1553mPO daily supplement -Sending out genetic testing  -Her BP is high today (201/94), we're recommending that her PCP look into a possible need for  adjusting her meds at this point. -given amlodipine in the clinic and given prescription for Amlodipine - f/u with PCP to monitor and mx -We also recommend she and her PCP look into a GI w/u to evaluate a possible cause for her IDA. -Labs today, CLL FISH Prognostic panel.   Labs today RTC with Dr KaIrene Limbon 2 months with labs    All of the patients questions were answered with apparent satisfaction. The patient knows to call the clinic with any problems, questions or concerns.  . The total time spent in the appointment was 25 minutes and more than 50% was on counseling and direct patient cares.    GaSullivan LoneD MSChisago CityAHIVMS SCSelect Specialty Hospital - DurhamTSheriff Al Cannon Detention Centerematology/Oncology Physician CoSpecialty Hospital Of Lorain(Office):       [phone] cell):  [phone]):  [phone]  12/15/2017 2:43 PM  This document serves as a record of services personally performed by Sullivan Lone, MD. It was created on his behalf by Baldwin Jamaica, a trained medical scribe. The creation of this record is based on the scribe's personal observations and the provider's statements to them.   .I have reviewed the above documentation for accuracy and completeness, and I agree with the above. Brunetta Genera MD MS

## 2017-12-15 ENCOUNTER — Encounter: Payer: Self-pay | Admitting: Hematology

## 2017-12-15 ENCOUNTER — Inpatient Hospital Stay: Payer: Medicare Other | Attending: Hematology | Admitting: Hematology

## 2017-12-15 ENCOUNTER — Inpatient Hospital Stay: Payer: Medicare Other

## 2017-12-15 ENCOUNTER — Telehealth: Payer: Self-pay | Admitting: Hematology

## 2017-12-15 VITALS — BP 201/94 | HR 83 | Temp 98.1°F | Resp 18 | Ht 60.0 in | Wt 181.7 lb

## 2017-12-15 DIAGNOSIS — E538 Deficiency of other specified B group vitamins: Secondary | ICD-10-CM

## 2017-12-15 DIAGNOSIS — C9110 Chronic lymphocytic leukemia of B-cell type not having achieved remission: Secondary | ICD-10-CM

## 2017-12-15 DIAGNOSIS — D509 Iron deficiency anemia, unspecified: Secondary | ICD-10-CM | POA: Diagnosis not present

## 2017-12-15 DIAGNOSIS — Z803 Family history of malignant neoplasm of breast: Secondary | ICD-10-CM | POA: Insufficient documentation

## 2017-12-15 DIAGNOSIS — Z79899 Other long term (current) drug therapy: Secondary | ICD-10-CM | POA: Diagnosis not present

## 2017-12-15 DIAGNOSIS — E785 Hyperlipidemia, unspecified: Secondary | ICD-10-CM | POA: Diagnosis not present

## 2017-12-15 DIAGNOSIS — K219 Gastro-esophageal reflux disease without esophagitis: Secondary | ICD-10-CM | POA: Diagnosis not present

## 2017-12-15 DIAGNOSIS — I700 Atherosclerosis of aorta: Secondary | ICD-10-CM

## 2017-12-15 DIAGNOSIS — I10 Essential (primary) hypertension: Secondary | ICD-10-CM | POA: Diagnosis not present

## 2017-12-15 DIAGNOSIS — Z8542 Personal history of malignant neoplasm of other parts of uterus: Secondary | ICD-10-CM | POA: Insufficient documentation

## 2017-12-15 DIAGNOSIS — E119 Type 2 diabetes mellitus without complications: Secondary | ICD-10-CM | POA: Diagnosis not present

## 2017-12-15 DIAGNOSIS — Z7982 Long term (current) use of aspirin: Secondary | ICD-10-CM | POA: Diagnosis not present

## 2017-12-15 DIAGNOSIS — F418 Other specified anxiety disorders: Secondary | ICD-10-CM

## 2017-12-15 DIAGNOSIS — C9190 Lymphoid leukemia, unspecified not having achieved remission: Secondary | ICD-10-CM | POA: Diagnosis not present

## 2017-12-15 DIAGNOSIS — E611 Iron deficiency: Secondary | ICD-10-CM

## 2017-12-15 DIAGNOSIS — I159 Secondary hypertension, unspecified: Secondary | ICD-10-CM

## 2017-12-15 DIAGNOSIS — Z888 Allergy status to other drugs, medicaments and biological substances status: Secondary | ICD-10-CM | POA: Diagnosis not present

## 2017-12-15 MED ORDER — AMLODIPINE BESYLATE 5 MG PO TABS
10.0000 mg | ORAL_TABLET | Freq: Once | ORAL | Status: AC
  Administered 2017-12-15: 10 mg via ORAL
  Filled 2017-12-15: qty 2

## 2017-12-15 MED ORDER — AMLODIPINE BESYLATE 10 MG PO TABS: 10.0000 mg | ORAL_TABLET | Freq: Every day | ORAL | 0 refills | Status: DC

## 2017-12-15 MED ORDER — AMLODIPINE BESYLATE 10 MG PO TABS
10.0000 mg | ORAL_TABLET | Freq: Every day | ORAL | Status: DC
  Filled 2017-12-15: qty 1

## 2017-12-15 MED FILL — AMLODIPINE BESYLATE 10 MG T: 10 | 30 days supply | Qty: 30 | Fill #0

## 2017-12-15 NOTE — Telephone Encounter (Signed)
Patient did not want avs or calendar - scheduled appts per 2/14 los.

## 2017-12-19 ENCOUNTER — Ambulatory Visit (INDEPENDENT_AMBULATORY_CARE_PROVIDER_SITE_OTHER): Payer: Medicare Other | Admitting: Family Medicine

## 2017-12-19 ENCOUNTER — Encounter: Payer: Self-pay | Admitting: Family Medicine

## 2017-12-19 VITALS — BP 138/80 | HR 72 | Temp 98.3°F | Ht 60.0 in | Wt 182.2 lb

## 2017-12-19 DIAGNOSIS — Z23 Encounter for immunization: Secondary | ICD-10-CM

## 2017-12-19 DIAGNOSIS — J90 Pleural effusion, not elsewhere classified: Secondary | ICD-10-CM | POA: Insufficient documentation

## 2017-12-19 DIAGNOSIS — I10 Essential (primary) hypertension: Secondary | ICD-10-CM | POA: Diagnosis not present

## 2017-12-19 MED ORDER — MELOXICAM 15 MG PO TABS: 15.0000 mg | ORAL_TABLET | Freq: Every day | ORAL | 1 refills | Status: DC

## 2017-12-19 MED FILL — MELOXICAM 15 MG TABLET: 15 | 30 days supply | Qty: 30 | Fill #0

## 2017-12-19 NOTE — Patient Instructions (Signed)
Great to see you. We are starting Mobic.  Please keep me updated.  Happy Birthday!  Please touch base me next week about the further imaging.

## 2017-12-19 NOTE — Progress Notes (Signed)
Subjective:   Patient ID: Brenda [name], female    DOB: 03-06-1951, 67 y.o.   MRN: [id number]  Brenda [name] is a pleasant 67 y.o. year old female who presents to clinic today with Hypertension (Patient is here today for elevated BP.  At Hematology on 2.14.19 BP was 201/90.  Today his BP is 146/84.  States that Benazapril-HCT was D/C'ed in Jan.  Amlodipine 30m was started on Friday and also started SL B12 then.  Dr. KIrene Limbo had her start the Amlodipine and increase Metoprolol to 2qam until seen.)  on 12/19/2017  HPI:  At oncologist's office, BP was elevated on 12/15/17. It was 201/90. She says she was nervous about going over the PET scan results but "not that nervous." No HA, blurred vision, CP or SOB.  Benazepril HCTZ was d/c'd in the hospital in January. Hematology added amlodipine and increased metoprolol to 2 tablets every morning on Friday. She says she feels fine.  She does admit to taking more Ibuprofen for her hip pain. PET scan did show some new pleural and pericardial effusions.  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  Current Outpatient Medications on File Prior to Visit  Medication Sig Dispense Refill  . amLODipine (NORVASC) 10 MG tablet Take 1 tablet (10 mg total) by mouth daily. 30 tablet 0  . aspirin 81 MG tablet Take 81 mg by mouth daily.    . Blood Glucose Monitoring Suppl (FREESTYLE FREEDOM LITE) w/Device KIT Use to check blood sugar twice a day, E11.65 1 each 1  . buPROPion (WELLBUTRIN XL) 300 MG 24 hr tablet Take 1 tablet (300 mg total) by mouth daily. 90 tablet 3  . Calcium-Magnesium-Vitamin D (CALCIUM 500 PO) Take by mouth 2 (two) times daily.    . cholecalciferol (VITAMIN D) 1000 UNITS tablet Take 2,000 Units by mouth daily.     . clonazePAM (KLONOPIN) 0.5 MG tablet TAKE 1 TABLET BY MOUTH 3 TIMES DAILY AS NEEDED FOR ANXIETY 90 tablet 3  . Esomeprazole Magnesium (NEXIUM PO) Take 22.5 mg by mouth daily.    .  furosemide (LASIX) 20 MG tablet Take 1 tablet (20 mg total) by mouth daily as needed. 90 tablet 3  . glipiZIDE (GLUCOTROL XL) 2.5 MG 24 hr tablet Take 1 tablet (2.5 mg total) by mouth 2 (two) times daily before a meal. 180 tablet 3  . glucose blood (FREESTYLE LITE) test strip Use to check blood sugar twice a day, E11.65 200 each 12  . Lancets (FREESTYLE) lancets Use to check blood sugar twice a day, E11.65 200 each 12  . liraglutide (VICTOZA) 18 MG/3ML SOPN Inject 0.3 mLs (1.8 mg total) into the skin daily. 27 mL 3  . metFORMIN (GLUCOPHAGE-XR) 500 MG 24 hr tablet TAKE 2 TABLETS (1,000 MG TOTAL) BY MOUTH 2 (TWO) TIMES DAILY WITH A MEAL. 360 tablet 0  . metoprolol succinate (TOPROL-XL) 25 MG 24 hr tablet TAKE 1 TABLET (25 MG TOTAL) BY MOUTH DAILY. 90 tablet 1  . Multiple Vitamins-Minerals (HM MULTIVITAMIN ADULT GUMMY PO) Take 1 tablet by mouth daily.    . simvastatin (ZOCOR) 40 MG tablet Take 40 mg by mouth daily.    .Marland KitchenUNIFINE PENTIPS 31G X 8 MM MISC USE AS DIRECTED WITH VICTOZA 100 each 88   No current facility-administered medications on file prior to visit.     Allergies  Allergen Reactions  . Gabapentin Other (See Comments)  confusion    Past Medical History:  Diagnosis Date  . Anxiety   . Arthritis   . Depression    Wellbutrin  . Diabetes mellitus    Type II  . Endometrial ca ([place])    1998  . 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 GERD (gastroesophageal reflux disease)   . Heart murmur    patient states "cardiologist has not heard heart murmur for past several years"  . Heel spur   . Hyperlipidemia   . Hypertension   . Left leg swelling    "swelling in left leg from knee down when sitting for prolonged periods or being on feet for a long period of time"  . PONV (postoperative nausea and vomiting)    after hysterectomy  . Renal insufficiency     Past Surgical History:  Procedure Laterality Date  . ABDOMINAL HYSTERECTOMY  1998    . COLONOSCOPY    . EYE SURGERY Bilateral    cataracts  . EYE SURGERY Left    retina tear  . FEMUR IM NAIL  10/25/2012   Procedure: INTRAMEDULLARY (IM) NAIL FEMORAL;  Surgeon: Mauri Pole, MD;  Location: [facility];  Service: Orthopedics;  Laterality: Left;  . HIP SURGERY    . left knee arthroscopy  12/2010  . REFRACTIVE SURGERY    . TOTAL KNEE ARTHROPLASTY  12/20/2011   Procedure: TOTAL KNEE ARTHROPLASTY;  Surgeon: Gearlean Alf, MD;  Location: [facility];  Service: Orthopedics;  Laterality: Left;  Failed attempt at spinal   . TOTAL SHOULDER ARTHROPLASTY Right 08/19/2016   Procedure: RIGHT TOTAL SHOULDER ARTHROPLASTY;  Surgeon: Justice Britain, MD;  Location: [facility];  Service: Orthopedics;  Laterality: Right;    Family History  Problem Relation Age of Onset  . Cancer Mother        breast cancer  . Cancer Father        plasma sarcoma  . Breast cancer Neg Hx     Social History   Socioeconomic History  . Marital status: Single    Spouse name: Not on file  . Number of children: Not on file  . Years of education: Not on file  . Highest education level: Not on file  Social Needs  . Financial resource strain: Not on file  . Food insecurity - worry: Not on file  . Food insecurity - inability: Not on file  . Transportation needs - medical: Not on file  . Transportation needs - non-medical: Not on file  Occupational History  . Occupation: Therapist, sports at [employer]  Tobacco Use  . Smoking status: Never Smoker  . Smokeless tobacco: Never Used  Substance and Sexual Activity  . Alcohol use: No  . Drug use: No  . Sexual activity: Not on file  Other Topics Concern  . 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   Review of Systems  Constitutional: Negative.   Respiratory: Negative.    Cardiovascular: Negative.   Musculoskeletal: Negative.   Neurological: Negative.   Hematological: Negative.   Psychiatric/Behavioral: Negative.   All other systems reviewed and are negative.      Objective:    BP 138/80 (BP Location: Right  Arm, Cuff Size: Normal)   Pulse 72   Temp 98.3 F (36.8 C) (Oral)   Ht 5' (1.524 m)   Wt 182 lb 3.2 oz (82.6 kg)   SpO2 95%   BMI 35.58 kg/m    Physical Exam   General:  Well-developed,well-nourished,in no acute distress; alert,appropriate and cooperative throughout examination Head:  normocephalic and atraumatic.   Eyes:  vision grossly intact Ears:  R ear normal and L ear normal externally Nose:  no external deformity.   Mouth:  good dentition.   Neck:  No deformities, masses, or tenderness noted. Lungs:  Normal respiratory effort, chest expands symmetrically. Lungs are clear to auscultation, no crackles or wheezes. Heart:  Normal rate and regular rhythm. S1 and S2 normal without gallop, murmur, click, rub or other extra sounds. Msk:  No deformity or scoliosis noted of thoracic or lumbar spine.   Extremities:  No clubbing, cyanosis, edema, or deformity noted with normal full range of motion of all joints.   Neurologic:  alert & oriented X3 and gait normal.   Skin:  Intact without suspicious lesions or rashes Psych:  Cognition and judgment appear intact. Alert and cooperative with normal attention span and concentration. No apparent delusions, illusions, hallucinations        Assessment & Plan:   Need for pneumococcal vaccination - Plan: Pneumococcal polysaccharide vaccine 23-valent greater than or equal to 2yo subcutaneous/IM  Essential hypertension No Follow-up on file.

## 2017-12-19 NOTE — Assessment & Plan Note (Signed)
Normotensive today. Advised to check it at work several times this week and to update me later next week, sooner if it is elevated again or if she becomes symptomatic. The patient indicates understanding of these issues and agrees with the plan.

## 2017-12-28 ENCOUNTER — Encounter: Payer: Self-pay | Admitting: Family Medicine

## 2017-12-29 LAB — FISH,CLL PROGNOSTIC PANEL

## 2018-01-10 ENCOUNTER — Encounter: Payer: Self-pay | Admitting: Family Medicine

## 2018-01-10 MED FILL — clonazePAM 0.5 MG TABS: 0.5 | 30 days supply | Qty: 90 | Fill #1

## 2018-01-12 ENCOUNTER — Other Ambulatory Visit: Payer: Self-pay

## 2018-01-12 MED ORDER — AMLODIPINE BESYLATE 10 MG PO TABS: 10.0000 mg | ORAL_TABLET | Freq: Every day | ORAL | 2 refills | Status: DC

## 2018-01-12 MED FILL — AMLODIPINE BESYLATE 10 MG T: 10 | 30 days supply | Qty: 30 | Fill #0

## 2018-01-17 ENCOUNTER — Ambulatory Visit: Payer: Self-pay

## 2018-01-17 NOTE — Telephone Encounter (Signed)
Pt calling for ongoing lower leg edema. Pt states no edema in the am, but as the day goes on, the edema comes back. Edema is worse to the left lower leg than the right. Pt attributes the edema to being on amlodipine. Pt had been hospitalized in January with acute renal failure due to "severe dehydration". Pt wears compression socks to her lower legs but stated that she has edema that extends above the stocking. Pt given appt for tomorrow with PCP.  Reason for Disposition . [1] MODERATE leg swelling (e.g., swelling extends up to knees) AND [2] new onset or worsening  Answer Assessment - Initial Assessment Questions 1. ONSET: "When did the swelling start?" (e.g., minutes, hours, days) ongoing 2. LOCATION: "What part of the leg is swollen?"  "Are both legs swollen or just one leg?"     Bilateral lower legs left greater than right 3. SEVERITY: "How bad is the swelling?" (e.g., localized; mild, moderate, severe)  - Localized - small area of swelling localized to one leg  - MILD pedal edema - swelling limited to foot and ankle, pitting edema < 1/4 inch (6 mm) deep, rest and elevation eliminate most or all swelling  - MODERATE edema - swelling of lower leg to knee, pitting edema > 1/4 inch (6 mm) deep, rest and elevation only partially reduce swelling  - SEVERE edema - swelling extends above knee, facial or hand swelling present      moderate 4. REDNESS: "Does the swelling look red or infected?" no 5. PAIN: "Is the swelling painful to touch?" If so, ask: "How painful is it?"   (Scale 1-10; mild, moderate or severe) no 6. FEVER: "Do you have a fever?" If so, ask: "What is it, how was it measured, and when did it start?"      no 7. CAUSE: "What do you think is causing the leg swelling?"     amlodipine 8. MEDICAL HISTORY: "Do you have a history of heart failure, kidney disease, liver failure, or cancer?"     Acute renal failure, endometrial cancer 20 years ago, seeing hematologist acute lymphocytic  leukemia 9. RECURRENT SYMPTOM: "Have you had leg swelling before?" If so, ask: "When was the last time?" "What happened that time?"    yes 10. OTHER SYMPTOMS: "Do you have any other symptoms?" (e.g., chest pain, difficulty breathing) In the am when she gets up in the am wheezing and SOB that lasts 15-20 minutes  11. PREGNANCY: "Is there any chance you are pregnant?" "When was your last menstrual period?"       n/a  Protocols used: LEG SWELLING AND EDEMA-A-AH

## 2018-01-18 ENCOUNTER — Ambulatory Visit (INDEPENDENT_AMBULATORY_CARE_PROVIDER_SITE_OTHER): Payer: Medicare Other | Admitting: Family Medicine

## 2018-01-18 ENCOUNTER — Encounter: Payer: Self-pay | Admitting: Family Medicine

## 2018-01-18 VITALS — BP 138/82 | HR 84 | Temp 98.3°F | Ht 60.0 in | Wt 190.8 lb

## 2018-01-18 DIAGNOSIS — N189 Chronic kidney disease, unspecified: Secondary | ICD-10-CM

## 2018-01-18 DIAGNOSIS — R600 Localized edema: Secondary | ICD-10-CM | POA: Insufficient documentation

## 2018-01-18 DIAGNOSIS — N179 Acute kidney failure, unspecified: Secondary | ICD-10-CM

## 2018-01-18 DIAGNOSIS — I10 Essential (primary) hypertension: Secondary | ICD-10-CM

## 2018-01-18 LAB — BASIC METABOLIC PANEL
BUN / CREAT RATIO: 20 (calc) (ref 6–22)
BUN: 24 mg/dL (ref 7–25)
CO2: 26 mmol/L (ref 20–32)
CREATININE: 1.23 mg/dL — AB (ref 0.50–0.99)
Calcium: 9.8 mg/dL (ref 8.6–10.4)
Chloride: 109 mmol/L (ref 98–110)
GLUCOSE: 88 mg/dL (ref 65–99)
Potassium: 4.9 mmol/L (ref 3.5–5.3)
Sodium: 144 mmol/L (ref 135–146)

## 2018-01-18 MED ORDER — BENAZEPRIL-HYDROCHLOROTHIAZIDE 10-12.5 MG PO TABS
1.0000 | ORAL_TABLET | Freq: Every day | ORAL | 3 refills | Status: DC
Start: 2018-01-18 — End: 2018-01-20

## 2018-01-18 MED FILL — BENAZEPRIL-HCTZ 10-12.5 MG: 10-12.5 | 30 days supply | Qty: 30 | Fill #0

## 2018-01-18 NOTE — Assessment & Plan Note (Signed)
Agree that amlodipine is likely causing new edema. Will d/c this and restart benazapril/hctz after we recheck her kidney function today. The patient indicates understanding of these issues and agrees with the plan.

## 2018-01-18 NOTE — Patient Instructions (Signed)
Nice to see you.  STOP taking amlodipine. STart taking Benazepril HCTZ daily.  I will call you renal function tomorrow.  Please just send me your blood pressure readings.

## 2018-01-18 NOTE — Progress Notes (Signed)
Subjective:   Patient ID: Brenda [name], female    DOB: 20-Nov-1950, 67 y.o.   MRN: [id number]  SIMISOLA Brenda [name] is a pleasant 67 y.o. year old female who presents to clinic today with Edema (Patient is here today C/O BLE Edema.  She states that her left leg has had problems with swelling since her hip and knee surgeries.  When she wakes the next morning it goes away but when she works 4 days in a row it takes 2 days before the swelling goes down.  The right leg started swelling within the last month but is not nearly as bad as the left.  LLE erythema and looks worse when swollen. )  on 01/18/2018  HPI:   Bilateral edema- feels it started when her benazapril-HCTZ was d/c'd due to acute renal failure and started on norvasc. It has become very uncomfortable despite wearing compression hose. Swelling is worse and now painful at the end of the day. Current Outpatient Medications on File Prior to Visit  Medication Sig Dispense Refill  . aspirin 81 MG tablet Take 81 mg by mouth daily.    . Blood Glucose Monitoring Suppl (FREESTYLE FREEDOM LITE) w/Device KIT Use to check blood sugar twice a day, E11.65 1 each 1  . buPROPion (WELLBUTRIN XL) 300 MG 24 hr tablet Take 1 tablet (300 mg total) by mouth daily. 90 tablet 3  . Calcium-Magnesium-Vitamin D (CALCIUM 500 PO) Take by mouth 2 (two) times daily.    . cholecalciferol (VITAMIN D) 1000 UNITS tablet Take 2,000 Units by mouth daily.     . clonazePAM (KLONOPIN) 0.5 MG tablet TAKE 1 TABLET BY MOUTH 3 TIMES DAILY AS NEEDED FOR ANXIETY 90 tablet 3  . Esomeprazole Magnesium (NEXIUM PO) Take 22.5 mg by mouth daily.    Marland Kitchen glipiZIDE (GLUCOTROL XL) 2.5 MG 24 hr tablet Take 1 tablet (2.5 mg total) by mouth 2 (two) times daily before a meal. 180 tablet 3  . glucose blood (FREESTYLE LITE) test strip Use to check blood sugar twice a day, E11.65 200 each 12  . iron polysaccharides (NIFEREX) 150 MG capsule Take 150 mg by mouth daily.    . Lancets (FREESTYLE)  lancets Use to check blood sugar twice a day, E11.65 200 each 12  . liraglutide (VICTOZA) 18 MG/3ML SOPN Inject 0.3 mLs (1.8 mg total) into the skin daily. 27 mL 3  . meloxicam (MOBIC) 15 MG tablet Take 1 tablet (15 mg total) by mouth daily. 30 tablet 1  . metFORMIN (GLUCOPHAGE-XR) 500 MG 24 hr tablet TAKE 2 TABLETS (1,000 MG TOTAL) BY MOUTH 2 (TWO) TIMES DAILY WITH A MEAL. 360 tablet 0  . metoprolol succinate (TOPROL-XL) 25 MG 24 hr tablet TAKE 1 TABLET (25 MG TOTAL) BY MOUTH DAILY. 90 tablet 1  . Multiple Vitamins-Minerals (HM MULTIVITAMIN ADULT GUMMY PO) Take 1 tablet by mouth daily.    . simvastatin (ZOCOR) 40 MG tablet Take 40 mg by mouth daily.    Marland Kitchen UNIFINE PENTIPS 31G X 8 MM MISC USE AS DIRECTED WITH VICTOZA 100 each 88   No current facility-administered medications on file prior to visit.     Allergies  Allergen Reactions  . Gabapentin Other (See Comments)    confusion    Past Medical History:  Diagnosis Date  . Anxiety   . Arthritis   . Depression    Wellbutrin  . Diabetes mellitus    Type II  . Endometrial ca ([place])    1998  .  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 GERD (gastroesophageal reflux disease)   . Heart murmur    patient states "cardiologist has not heard heart murmur for past several years"  . Heel spur   . Hyperlipidemia   . Hypertension   . Left leg swelling    "swelling in left leg from knee down when sitting for prolonged periods or being on feet for a long period of time"  . PONV (postoperative nausea and vomiting)    after hysterectomy  . Renal insufficiency     Past Surgical History:  Procedure Laterality Date  . ABDOMINAL HYSTERECTOMY  1998  . COLONOSCOPY    . EYE SURGERY Bilateral    cataracts  . EYE SURGERY Left    retina tear  . FEMUR IM NAIL  10/25/2012   Procedure: INTRAMEDULLARY (IM) NAIL FEMORAL;  Surgeon: Matthew D Olin, MD;  Location: [facility];  Service: Orthopedics;  Laterality: Left;  . HIP  SURGERY    . left knee arthroscopy  12/2010  . REFRACTIVE SURGERY    . TOTAL KNEE ARTHROPLASTY  12/20/2011   Procedure: TOTAL KNEE ARTHROPLASTY;  Surgeon: Frank V Aluisio, MD;  Location: [facility];  Service: Orthopedics;  Laterality: Left;  Failed attempt at spinal   . TOTAL SHOULDER ARTHROPLASTY Right 08/19/2016   Procedure: RIGHT TOTAL SHOULDER ARTHROPLASTY;  Surgeon: Kevin Supple, MD;  Location: [facility];  Service: Orthopedics;  Laterality: Right;    Family History  Problem Relation Age of Onset  . Cancer Mother        breast cancer  . Cancer Father        plasma sarcoma  . Breast cancer Neg Hx     Social History   Socioeconomic History  . Marital status: Single    Spouse name: Not on file  . Number of children: Not on file  . Years of education: Not on file  . Highest education level: Not on file  Social Needs  . Financial resource strain: Not on file  . Food insecurity - worry: Not on file  . Food insecurity - inability: Not on file  . Transportation needs - medical: Not on file  . Transportation needs - non-medical: Not on file  Occupational History  . 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 Smoking status: Never Smoker  . Smokeless tobacco: Never Used  Substance and Sexual Activity  . Alcohol use: No  . Drug use: No  . Sexual activity: Not on file  Other Topics Concern  . 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   Review of Systems  Constitutional: Negative.   Respiratory: Negative.   Cardiovascular: Positive for leg swelling. Negative for chest pain and palpitations.  All other systems reviewed and are negative.      Objective:    BP 138/82 (BP Location: Left Arm, Patient Position: Sitting, Cuff Size: Normal)   Pulse 84   Temp 98.3 F (36.8 C) (Oral)    Ht 5' (1.524 m)   Wt 190 lb 12.8 oz (86.5 kg)   SpO2 97%   BMI 37.26 kg/m    Physical Exam  Constitutional: She is oriented to   person, place, and time. She appears well-developed and well-nourished.  HENT:  Head: Normocephalic and atraumatic.  Neck: Normal range of motion. Neck supple.  Cardiovascular: Normal rate.  Pulmonary/Chest: Effort normal.  Musculoskeletal: She exhibits edema.  Neurological: She is alert and oriented to person, place, and time. No cranial nerve deficit.  Skin: Skin is warm and dry. She is not diaphoretic.  Psychiatric: She has a normal mood and affect. Her behavior is normal. Thought content normal.  Nursing note and vitals reviewed.         Assessment & Plan:   Essential hypertension - Plan: Basic metabolic panel, CANCELED: Basic metabolic panel  Acute renal failure superimposed on chronic kidney disease, unspecified CKD stage, unspecified acute renal failure type (HCC) - Plan: Basic metabolic panel, CANCELED: Basic metabolic panel No Follow-up on file.  

## 2018-01-20 ENCOUNTER — Ambulatory Visit (INDEPENDENT_AMBULATORY_CARE_PROVIDER_SITE_OTHER): Payer: Medicare Other | Admitting: Internal Medicine

## 2018-01-20 ENCOUNTER — Encounter: Payer: Self-pay | Admitting: Internal Medicine

## 2018-01-20 VITALS — BP 162/84 | HR 80 | Ht 60.0 in | Wt 192.4 lb

## 2018-01-20 DIAGNOSIS — E1165 Type 2 diabetes mellitus with hyperglycemia: Secondary | ICD-10-CM

## 2018-01-20 DIAGNOSIS — Z6835 Body mass index (BMI) 35.0-35.9, adult: Secondary | ICD-10-CM

## 2018-01-20 DIAGNOSIS — E782 Mixed hyperlipidemia: Secondary | ICD-10-CM | POA: Diagnosis not present

## 2018-01-20 LAB — POCT GLYCOSYLATED HEMOGLOBIN (HGB A1C): Hemoglobin A1C: 5.7

## 2018-01-20 MED ORDER — GLIPIZIDE ER 2.5 MG PO TB24: 2.5000 mg | ORAL_TABLET | Freq: Every day | ORAL | 3 refills | Status: DC

## 2018-01-20 NOTE — Patient Instructions (Addendum)
Please continue: - Metformin ER 1000 mg 2x a day - Glipizide ER 2.5 mg before dinner and stop the Glipizide in am  Please return in 4 months with your sugar log.

## 2018-01-20 NOTE — Progress Notes (Signed)
Patient ID: Brenda [name], female   DOB: 1951-01-31, 67 y.o.   MRN: [id number]  HPI: Brenda [name] is a 67 y.o.-year-old female-year-old female, returning for follow-up for DM2, dx in 2007, non-insulin-dependent, uncontrolled, with complications (mild CKD). Last visit 5 months ago.  She was admitted 11/2017 for dehydration, anemia, and acute renal failure >> diagnosed with CLL.  She is seeing hematology and she appears stable, with improved kidney function and CBC.  Since last visit she had low CBGs and she ended up stopping Victoza.  She still has some sugars in the 60s and 70s mostly before dinner  Last hemoglobin A1c was: Lab Results  Component Value Date   HGBA1C 6.5 08/22/2017   HGBA1C 7.5 05/13/2017   HGBA1C 6.9 01/18/2017   Pt is on a regimen of: - Metformin ER 1000 mg 2x a day - Glipizide ER 2.5 mg 2x a day before meals (could not crush the pill before dinner)  >> stopped 11/2017 She was on Glipizide in 11/2015 after a steroid inj. At last visit, we stopped Glipizide 5 mg 2x a day, before meals, because of lows.  Pt checks her sugars 2-3 times a day: - am: 92-160, 172 >> 55, 99-152, 160 >>  52x1, 78-128 - 2h after b'fast: 105, 129 >> n/c >> 156 >> n/c - before lunch: 63, 114-121, 148 >> 89 >> 117-131 >> 69-87 - 2h after lunch:  64, 81-138 >> n/c >> 98 >> 118, 123 - before dinner:66-127, 150, 165 >> 62-142, 157 173 >> 64-102, 121 - 2h after dinner: 151 >> 180 >> n/c >> 138, 148 - bedtime: n/c >> 132-207 >> 74, 80-170 >> 91-143 >> n/c - nighttime: n/c Lowest sugar was 37 x1 >> 66 >> 55 >> 52; she has hypoglycemia awareness in the 70s. Highest sugar was 260 x 1 >> 150.  Glucometer: Freestyle Lite  Pt's meals are: - Breakfast: English muffin + preserve or cereal or yoghurt - snack: fruit or fruit + yoghurt - Lunch: 1/2 sandwich + chips + salsa + fruit/sugar free - Dinner: soup or chicken/steak + veggies, occasional starch or Lean cuisine or light salad  -+ Mild  CKD, last  BUN/creatinine:  Lab Results  Component Value Date   BUN 24 01/18/2018   CREATININE 1.23 (H) 01/18/2018  On benazepril. - + HL; last set of lipids: Lab Results  Component Value Date   CHOL 162 03/07/2017   HDL 33.60 (L) 03/07/2017   LDLCALC 59 07/21/2016   LDLDIRECT 88.0 03/07/2017   TRIG 286.0 (H) 03/07/2017   CHOLHDL 5 03/07/2017  Off simvastatin because of leg cramps - last eye exam 03/2017: No DR she had cataract surgery in 2016. Has a h/o retinal tear >1 year ago. -  she denies numbness and tingling in her feet. On Gabapentin for leg cramps at night. On ASA 81  She also has HTN and HL, GERD, depression.  She developed increased leg swelling due to amlodipine but this was stopped earlier this week.  After her acute renal failure episode she stopped benazepril and HCTZ but she is now back on these.  ROS: Constitutional: no weight gain/no weight loss, + fatigue, no subjective hyperthermia, no subjective hypothermia Eyes: no blurry vision, no xerophthalmia ENT: no sore throat, no nodules palpated in throat, no dysphagia, no odynophagia, no hoarseness Cardiovascular: no CP/+ SOB/no palpitations/+ leg swelling Respiratory: no cough/+ SOB/no wheezing Gastrointestinal: no N/no V/no D/no C/no acid reflux Musculoskeletal: no muscle aches/no joint aches Skin: no rashes,  no hair loss Neurological: no tremors/no numbness/no tingling/no dizziness  I reviewed pt's medications, allergies, PMH, social hx, family hx, and changes were documented in the history of present illness. Otherwise, unchanged from my initial visit note.  Past Medical History:  Diagnosis Date  . Anxiety   . Arthritis   . Depression    Wellbutrin  . Diabetes mellitus    Type II  . Endometrial ca ([place])    1998  . 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 GERD (gastroesophageal reflux disease)   . Heart murmur    patient states "cardiologist has not heard heart murmur for  past several years"  . Heel spur   . Hyperlipidemia   . Hypertension   . Left leg swelling    "swelling in left leg from knee down when sitting for prolonged periods or being on feet for a long period of time"  . PONV (postoperative nausea and vomiting)    after hysterectomy  . Renal insufficiency    Past Surgical History:  Procedure Laterality Date  . ABDOMINAL HYSTERECTOMY  1998  . COLONOSCOPY    . EYE SURGERY Bilateral    cataracts  . EYE SURGERY Left    retina tear  . FEMUR IM NAIL  10/25/2012   Procedure: INTRAMEDULLARY (IM) NAIL FEMORAL;  Surgeon: Mauri Pole, MD;  Location: [facility];  Service: Orthopedics;  Laterality: Left;  . HIP SURGERY    . left knee arthroscopy  12/2010  . REFRACTIVE SURGERY    . TOTAL KNEE ARTHROPLASTY  12/20/2011   Procedure: TOTAL KNEE ARTHROPLASTY;  Surgeon: Gearlean Alf, MD;  Location: [facility];  Service: Orthopedics;  Laterality: Left;  Failed attempt at spinal   . TOTAL SHOULDER ARTHROPLASTY Right 08/19/2016   Procedure: RIGHT TOTAL SHOULDER ARTHROPLASTY;  Surgeon: Justice Britain, MD;  Location: [facility];  Service: Orthopedics;  Laterality: Right;   Social History   Social History  . Marital Status: Single    Spouse Name: N/A  . Number of Children: 0   Occupational History  . 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 Smoking status: Never Smoker   . Smokeless tobacco: Never Used  . Alcohol Use: No  . 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 aspirin 81 MG tablet Take 81 mg by mouth daily.    . benazepril-hydrochlorthiazide (LOTENSIN HCT) 10-12.5 MG tablet Take 1 tablet by mouth daily. 30 tablet 3  . Blood Glucose Monitoring Suppl (FREESTYLE FREEDOM LITE) w/Device KIT Use to check blood sugar twice a day, E11.65 1 each 1  . buPROPion (WELLBUTRIN XL) 300 MG 24 hr tablet Take 1 tablet (300 mg total) by mouth daily.  90 tablet 3  . Calcium-Magnesium-Vitamin D (CALCIUM 500 PO) Take by mouth 2 (two) times daily.    . cholecalciferol (VITAMIN D) 1000 UNITS tablet Take 2,000 Units by mouth daily.     . clonazePAM (KLONOPIN) 0.5 MG tablet TAKE 1 TABLET BY MOUTH 3 TIMES DAILY AS NEEDED FOR ANXIETY 90 tablet 3  . Esomeprazole Magnesium (NEXIUM PO) Take 22.5 mg by mouth daily.    Marland Kitchen glipiZIDE (GLUCOTROL XL) 2.5 MG 24 hr tablet Take 1 tablet (2.5 mg total) by mouth 2 (two) times daily before a meal. 180 tablet 3  .  glucose blood (FREESTYLE LITE) test strip Use to check blood sugar twice a day, E11.65 200 each 12  . iron polysaccharides (NIFEREX) 150 MG capsule Take 150 mg by mouth daily.    . Lancets (FREESTYLE) lancets Use to check blood sugar twice a day, E11.65 200 each 12  . liraglutide (VICTOZA) 18 MG/3ML SOPN Inject 0.3 mLs (1.8 mg total) into the skin daily. 27 mL 3  . meloxicam (MOBIC) 15 MG tablet Take 1 tablet (15 mg total) by mouth daily. 30 tablet 1  . metFORMIN (GLUCOPHAGE-XR) 500 MG 24 hr tablet TAKE 2 TABLETS (1,000 MG TOTAL) BY MOUTH 2 (TWO) TIMES DAILY WITH A MEAL. 360 tablet 0  . metoprolol succinate (TOPROL-XL) 25 MG 24 hr tablet TAKE 1 TABLET (25 MG TOTAL) BY MOUTH DAILY. 90 tablet 1  . Multiple Vitamins-Minerals (HM MULTIVITAMIN ADULT GUMMY PO) Take 1 tablet by mouth daily.    . simvastatin (ZOCOR) 40 MG tablet Take 40 mg by mouth daily.    Marland Kitchen UNIFINE PENTIPS 31G X 8 MM MISC USE AS DIRECTED WITH VICTOZA 100 each 88   No current facility-administered medications on file prior to visit.    Allergies  Allergen Reactions  . Gabapentin Other (See Comments)    confusion   Family History  Problem Relation Age of Onset  . Cancer Mother        breast cancer  . Cancer Father        plasma sarcoma  . Breast cancer Neg Hx    PE: BP (!) 162/84   Pulse 80   Ht 5' (1.524 m)   Wt 192 lb 6.4 oz (87.3 kg)   SpO2 96%   BMI 37.58 kg/m  Body mass index is 37.58 kg/m. Wt Readings from Last 3  Encounters:  01/20/18 192 lb 6.4 oz (87.3 kg)  01/18/18 190 lb 12.8 oz (86.5 kg)  12/19/17 182 lb 3.2 oz (82.6 kg)   Constitutional: overweight, in NAD Eyes: PERRLA, EOMI, no exophthalmos ENT: moist mucous membranes, no thyromegaly, no cervical lymphadenopathy Cardiovascular: RRR, No MRG, +L>R leg swelling Respiratory: CTA B Gastrointestinal: abdomen soft, NT, ND, BS+ Musculoskeletal: no deformities, strength intact in all 4 Skin: moist, warm, no rashes Neurological: no tremor with outstretched hands, DTR normal in all 4  ASSESSMENT: 1. DM2, non-insulin-dependent, uncontrolled, with complications and hyperglycemia - mild CKD  2. Obesity  PLAN:  1. Patient with long-standing, previously uncontrolled diabetes, with recent improvement in control.  At last visit, she lost some weight and sugars were better.  She did have some nausea with Victoza and we discussed about reducing the dose for a period of time and then trying to increase it again.  At last visit, she also had 2 low blood sugars in a.m.: 55 and 62 and we discussed  to let me know if she had more, in that case, we would have needed to decrease the glipizide.  She did not contact me but she continued to have low blood sugars and she ended up stopping the Victoza.  Blood sugars remain well controlled and with still mild lows before dinner even after stopping Victoza. -At this visit, we will stop glipizide in a.m. but since sugars after dinner and in a.m. are great, will continue the glipizide before dinner.  I advised her to contact me if she continues to have lower blood sugars so we can stop the glipizide before dinner also. - I suggested to:  Patient Instructions  Please continue: - Metformin ER  1000 mg 2x a day - Glipizide ER 2.5 mg before dinner and stop the Glipizide in am  Please return in 4 months with your sugar log.   - today, HbA1c is 5.7% (excellent) - continue checking sugars at different times of the day - check 1x  a day, rotating checks - advised for yearly eye exams >> she is UTD - Return to clinic in 4 mo with sugar log   2. Obesity -She unfortunately gained back all the weight loss before last visit but, this is most likely fluid retention after stopping the diuretic.  She just started back to diuretic 2 days ago.  3.  Hyperlipidemia -Reviewed latest lipid panel from 03/2017: LDL at goal, however, triglycerides are still high -She is not on statins due to previous intolerance  [name] Kingdom, MD PhD [facility]

## 2018-01-31 MED FILL — BUPROPION HCL XL 300 MG TAB: 300 | 90 days supply | Qty: 90 | Fill #3

## 2018-02-03 NOTE — Progress Notes (Signed)
HEMATOLOGY/ONCOLOGY CLINIC NOTE  Date of Service: 02/06/18  Patient Care Team: Lucille Passy, MD as PCP - General Rockey Situ Kathlene November, MD as Consulting Physician (Cardiology)  CHIEF COMPLAINTS/PURPOSE OF CONSULTATION:  F/u for continue mx of CLL  HISTORY OF PRESENTING ILLNESS:   Brenda [name] is a wonderful 67 y.o. female who was hospitalized on 11/17/17 for abdominal discomfort, nausea and anorexia.   Further workup showed elevated kidney function tests and CT of the abd and pelvis demonstrated pelvic lymphadenopathy and innumerable pulmonary nodulesin the bases concerning for metastases.  Today she is here for an outpatient follow up accompanied by her friend. She notes she has not felt her usual self in the past 2 months with fatigue and lack of appetite. She also noticed diarrhea that comes and goes and her energy level would continue to drop. She has lost 25-30 pounds since end of summer 2018. She will notice mucous with her stool but no bloody or black stool. She does not take any meds that she would associate with her diarrhea or lower appetite and she denies change in her chronic medications. She has been on Nexium for a long period of time. She notes she does not often get sick. Dr. Deborra Medina is her PCP. She was using 260m ibuprofen 2-3 times a day more often recently due to her hip pain.   She notes her mother had breast cancer and diagnosed at 556yo. Her father had a rare form of cancer who died 3 weeks after diagnosis in his 854s Her brother passed from non-alcohol liver cirrhosis.   On review of symptoms, pt notes since her discharge she no longer has abdominal pain but will still have occasional diarrhea. She has not had diarrhea in several days. She notes to having a slight cough but mostly sinus drainage. Her appetite and eating has improved. She notes due to her knee replacement and hip fracture she will have swelling in her left leg. She has lower back pain at times. She  denies feeling lumps or bumps in her axilla b/l or groin. She denies dysuria or change in her urination.   Interval History:  Brenda SHINSKYreturns today regarding her newly diagnosed Chronic Lymphocytic Leukemia. The patient's last visit with [facility] on 12/15/17. The pt reports that she is doing well overall.   The pt reports that she her PCP Dr ADeborra Medinaregarding leg swelling related to Amlodipine. She has since stopped Amlodipine, and begun HCTZ. She notes that she will be discussing this with Dr. ADeborra Medinalater today. She notes that her leg swelling is resolved as of today.  She notes that she also continues Mobic 144mand Toprol 2517m She notes that since having a hysterectomy 25 years ago, she hasn't been able to go to sleep quickly. She notes there thyroid levels have been adequate, and has not been concerned enough to seek out a sleep study.   Of note since the patient's last visit, pt has had her FISH CLL Prognostic Panel completed on 12/15/17 with results revealing a Monoallelic deletion of [id number]) is detected.  Lab results today (02/06/18) of CBC, CMP, and Reticulocytes is as follows: all values are WNL except for MCHC at 31.1, Creatinine at 1.23. Ferritin 02/06/18 is WNL at 35. LDH WNL 189 B12 - 933  On review of systems, pt reports resolved leg swelling, reduced GERD symptoms, and denies blood in the stools, black stools, noticing any new lumps or bumps, fevers, chills,  night sweats, abdominal pains, and any other symptoms.   MEDICAL HISTORY:  Past Medical History:  Diagnosis Date  . Anxiety   . Arthritis   . Depression    Wellbutrin  . Diabetes mellitus    Type II  . Endometrial ca ([place])    1998  . 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 GERD (gastroesophageal reflux disease)   . Heart murmur    patient states "cardiologist has not heard heart murmur for past several years"  . Heel spur   . Hyperlipidemia   . Hypertension   .  Left leg swelling    "swelling in left leg from knee down when sitting for prolonged periods or being on feet for a long period of time"  . PONV (postoperative nausea and vomiting)    after hysterectomy  . Renal insufficiency     SURGICAL HISTORY: Past Surgical History:  Procedure Laterality Date  . ABDOMINAL HYSTERECTOMY  1998  . COLONOSCOPY    . EYE SURGERY Bilateral    cataracts  . EYE SURGERY Left    retina tear  . FEMUR IM NAIL  10/25/2012   Procedure: INTRAMEDULLARY (IM) NAIL FEMORAL;  Surgeon: Mauri Pole, MD;  Location: [facility];  Service: Orthopedics;  Laterality: Left;  . HIP SURGERY    . left knee arthroscopy  12/2010  . REFRACTIVE SURGERY    . TOTAL KNEE ARTHROPLASTY  12/20/2011   Procedure: TOTAL KNEE ARTHROPLASTY;  Surgeon: Gearlean Alf, MD;  Location: [facility];  Service: Orthopedics;  Laterality: Left;  Failed attempt at spinal   . TOTAL SHOULDER ARTHROPLASTY Right 08/19/2016   Procedure: RIGHT TOTAL SHOULDER ARTHROPLASTY;  Surgeon: Justice Britain, MD;  Location: [place];  Service: Orthopedics;  Laterality: Right;    SOCIAL HISTORY: Social History   Socioeconomic History  . Marital status: Single    Spouse name: Not on file  . Number of children: Not on file  . Years of education: Not on file  . Highest education level: Not on file  Occupational History  . Occupation: Therapist, sports at [employer] [place]  Social Needs  . Financial resource strain: Not on file  . Food insecurity:    Worry: Not on file    Inability: Not on file  . Transportation needs:    Medical: Not on file    Non-medical: Not on file  Tobacco Use  . Smoking status: Never Smoker  . Smokeless tobacco: Never Used  Substance and Sexual Activity  . Alcohol use: No  . Drug use: No  . Sexual activity: Not on file  Lifestyle  . Physical activity:    Days per week: Not on file    Minutes per session: Not on file  . Stress: Not on file  Relationships  . Social connections:      Talks on phone: Not on file    Gets together: Not on file    Attends religious service: Not on file    Active member of club or organization: Not on file    Attends meetings of clubs or organizations: Not on file    Relationship status: Not on file  . Intimate partner violence:    Fear of current or ex partner: Not on file    Emotionally abused: Not on file    Physically abused: Not on file    Forced sexual activity: Not on file  Other  Topics Concern  . 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 Cancer Mother        breast cancer  . Cancer Father        plasma sarcoma  . Breast cancer Neg Hx     ALLERGIES:  is allergic to gabapentin.  MEDICATIONS:  Current Outpatient Medications  Medication Sig Dispense Refill  . aspirin 81 MG tablet Take 81 mg by mouth daily.    Marland Kitchen BENAZEPRIL-HYDROCHLOROTHIAZIDE PO Take 1 tablet by mouth daily.    . Blood Glucose Monitoring Suppl (FREESTYLE FREEDOM LITE) w/Device KIT Use to check blood sugar twice a day, E11.65 1 each 1  . buPROPion (WELLBUTRIN XL) 300 MG 24 hr tablet Take 1 tablet (300 mg total) by mouth daily. 90 tablet 3  . Calcium-Magnesium-Vitamin D (CALCIUM 500 PO) Take by mouth 2 (two) times daily.    . cholecalciferol (VITAMIN D) 1000 UNITS tablet Take 2,000 Units by mouth daily.     . clonazePAM (KLONOPIN) 0.5 MG tablet TAKE 1 TABLET BY MOUTH 3 TIMES DAILY AS NEEDED FOR ANXIETY 90 tablet 3  . Cyanocobalamin (B-12) 1000 MCG SUBL Place 1 tablet under the tongue daily.    . Esomeprazole Magnesium (NEXIUM PO) Take 22.5 mg by mouth daily.    Marland Kitchen glipiZIDE (GLUCOTROL XL) 2.5 MG 24 hr tablet Take 1 tablet (2.5 mg total) by mouth daily with supper. 90 tablet 3  . glucose blood (FREESTYLE LITE) test strip Use to check blood sugar twice a day, E11.65 200 each 12  . iron polysaccharides (NIFEREX) 150 MG capsule Take 150 mg by mouth daily.    .  Lancets (FREESTYLE) lancets Use to check blood sugar twice a day, E11.65 200 each 12  . meloxicam (MOBIC) 15 MG tablet Take 1 tablet (15 mg total) by mouth daily. 30 tablet 1  . metFORMIN (GLUCOPHAGE-XR) 500 MG 24 hr tablet TAKE 2 TABLETS (1,000 MG TOTAL) BY MOUTH 2 (TWO) TIMES DAILY WITH A MEAL. 360 tablet 0  . metoprolol succinate (TOPROL-XL) 25 MG 24 hr tablet TAKE 1 TABLET (25 MG TOTAL) BY MOUTH DAILY. 90 tablet 1  . Multiple Vitamins-Minerals (HM MULTIVITAMIN ADULT GUMMY PO) Take 1 tablet by mouth daily.    . simvastatin (ZOCOR) 40 MG tablet Take 40 mg by mouth daily.    Marland Kitchen UNIFINE PENTIPS 31G X 8 MM MISC USE AS DIRECTED WITH VICTOZA 100 each 88   No current facility-administered medications for this visit.     REVIEW OF SYSTEMS:    10 Point review of Systems was done is negative except as noted above.   PHYSICAL EXAMINATION: ECOG PERFORMANCE STATUS: 1 - Symptomatic but completely ambulatory  . There were no vitals filed for this visit. There were no vitals filed for this visit. .There is no height or weight on file to calculate BMI.  GENERAL:alert, in no acute distress and comfortable SKIN: no acute rashes, no significant lesions EYES: conjunctiva are pink and non-injected, sclera anicteric OROPHARYNX: MMM, no exudates, no oropharyngeal erythema or ulceration NECK: supple, no JVD LYMPH:  no palpable lymphadenopathy in the cervical, axillary or inguinal regions LUNGS: clear to auscultation b/l with normal respiratory effort HEART: regular rate & rhythm ABDOMEN:  normoactive bowel sounds , non tender, not distended. Extremity: 1+ left pedal edema, no right pedal edema.  PSYCH: alert & oriented x 3 with fluent speech NEURO: no focal motor/sensory deficits     LABORATORY DATA:  I have reviewed the data as listed  . CBC Latest Ref Rng & Units 02/06/2018 11/29/2017 11/28/2017  WBC 3.9 - 10.3 K/uL 9.2 11.8(H) 11.8(H)  Hemoglobin 12.0 - 15.0 g/dL - - 10.2(L)  Hematocrit 34.8 -  46.6 % 38.9 35.7 33.5(L)  Platelets 145 - 400 K/uL 206 273 291.0  hgb 10.7  .Marland Kitchen CBC    Component Value Date/Time   WBC 9.2 02/06/2018 0908   WBC 11.8 (H) 11/28/2017 1127   RBC 4.48 02/06/2018 0908   RBC 4.48 02/06/2018 0908   HGB 10.2 (L) 11/28/2017 1127   HCT 38.9 02/06/2018 0908   PLT 206 02/06/2018 0908   MCV 86.8 02/06/2018 0908   MCH 27.0 02/06/2018 0908   MCHC 31.1 (L) 02/06/2018 0908   RDW 13.9 02/06/2018 0908   LYMPHSABS 2.8 02/06/2018 0908   MONOABS 0.5 02/06/2018 0908   EOSABS 0.3 02/06/2018 0908   BASOSABS 0.1 02/06/2018 0908    CMP Latest Ref Rng & Units 01/18/2018 11/29/2017 11/28/2017  Glucose 65 - 99 mg/dL 88 64(L) 84  BUN 7 - 25 mg/dL 24 17 16   Creatinine 0.50 - 0.99 mg/dL 1.23(H) 1.60(H) 1.37(H)  Sodium 135 - 146 mmol/L 144 145 147(H)  Potassium 3.5 - 5.3 mmol/L 4.9 3.7 4.2  Chloride 98 - 110 mmol/L 109 111(H) 108  CO2 20 - 32 mmol/L 26 23 25   Calcium 8.6 - 10.4 mg/dL 9.8 8.0(L) 8.0(L)  Total Protein 6.4 - 8.3 g/dL - 6.3(L) 6.3  Total Bilirubin 0.2 - 1.2 mg/dL - 0.4 0.6  Alkaline Phos 40 - 150 U/L - 99 83  AST 5 - 34 U/L - 11 11  ALT 0 - 55 U/L - 8 9   Component     Latest Ref Rng & Units 11/29/2017  Iron     41 - 142 ug/dL 55  TIBC     236 - 444 ug/dL 294  Saturation Ratios     21 - 57 % 19 (L)  UIBC     ug/dL 239  Retic Ct Pct     0.7 - 2.1 % 2.1  RBC.     3.70 - 5.45 MIL/uL 4.23  Retic Count, Absolute     33.7 - 90.7 K/uL 88.8  Ferritin     9 - 269 ng/mL 308 (H)  LDH     125 - 245 U/L 222  Parietal Cell Antibody-IgG     0.0 - 20.0 Units 2.0  Intrinsic Factor     0.0 - 1.1 AU/mL 1.1     12/15/17 FISH CLL Prognostic Panel:    RADIOGRAPHIC STUDIES: I have personally reviewed the radiological images as listed and agreed with the findings in the report. No results found.  ASSESSMENT & PLAN:  Brenda [name] is a 67 y.o. caucasian female with   1. Newly diagnosed CLL/SLL - monoalleilic 46F deletion. Abdominal + chest + Axillary  Lymphadenopathy -LDH level returned normal at 118 on 11/1917 Flow cytometry is concerning for CD5+ clonal lymphoproliferative disorder ( CLL/SLL vs Mantle cell lymphoma) . Lab Results  Component Value Date   LDH 222 11/29/2017    2. Multiple pulmonary nodules   CT AP on 11/17/17 - with 1. Pelvic lymphadenopathy and innumerable pulmonary nodules in the lung bases, consistent with metastatic disease. 2. Cholelithiasis without CT evidence of acute cholecystitis. 3.  Aortic atherosclerosis  CT Chest  on 11/20/17 - with multiple small lung nodules in LLL that appear stable from prior CT in 12/2015 and are considered benign and Upper lung nodules also appear benign. Also with several borderline enlarged lymph nodes concerning for a lymphoproliferative disorder   3. H/o endometrial cancer in 1998 -localized to uterus -treated with surgery, abdominal hysterectomy  -no evidence of recurrence  PLAN:   -I reviewed her CT scans and discussed with her concerning borderline enlarged lymph nodes and overall benign multiple lung nodules -no constitutional symptoms. - 4. Microcytic anemia - Fe deficiency  Presented in hospital with Hgb of 6.6 Hgb holding steady s/p 2U PRBC - Fe infusioncompleted 1/19- stool hemoccult negative - suspect this may be due to CKD, but iron deficiency also worrisome for occult GIB  -Tolerated IV iron well  . Lab Results  Component Value Date   IRON 60 02/06/2018   TIBC 394 02/06/2018   IRONPCTSAT 15 (L) 02/06/2018   (Iron and TIBC)  Lab Results  Component Value Date   FERRITIN 35 02/06/2018    5. Marginal B12 levels in hospital  (Antiparietal celll and anti IF Ab negative) -given one B12 injection in hospital  PLAN  -reviewed 11/29/17 labs which showed Hgb  improved to 10.7. Cr levels much improved to 1.37 -I recommend oral sublingual B12 to help her levels.  -Last colonoscopy was 2011 and normal- I recommend she set up GI workup with [facility].    -Discussed her higher lymphocyte number, and that her signature is most consistent with Chronic Lymphocytic Leukemia unaccompanied by large amount of lymphocytes: Lymph Abs at 4.0k. Presenting with small lymph nodes.  -Biopsy at this point is probably not warranted; if genetic testing reveals Mantel Cell lymphoma then we will reconsider this.  -CD5 population identified -Discussed the constitutional symptoms that we will look for. Wait and watch regarding her diagnosis with occasional scans and routine blood work.  -We noted that her anemia is likely not connected with her CLL given her history of iron-deficiency anemia.  -We advised Vitamin B12 1059m daily under the tongue supplements and OTC ironpolysaccharide 15107mPO daily supplement -Her BP is high today (201/94), we're recommending that her PCP look into a possible need for adjusting her meds at this point. -We also recommend she and her PCP look into a GI w/u to evaluate a possible cause for her IDA. -Discussed pt labwork today 02/06/18; blood counts are stable, and Hgb has improved to 12.1, MCV is normal. Ferritin has dropped to 35. WBC have normalized to 9.2k, and Lymphs Abs have normalized to 2.8k.  -Continue PO iron for now, and consider IV iron replacement as necessary.  -Discussed that Mobic can increase her BP and present a risk of ulcerations.  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  -Discussed that the pt does not currently have any constitutional symptoms, has no significant bothers, and her labs are not worrisome. -In light of this, we will continue to watch the pt with routine labs.  -We would like to see pt back in 3-4 months with repeat scans.    CT chest/abd/pelvis in 12 weeks  RTC with Dr KaIrene Limbon 3 months with labs    All of the patients questions were answered with apparent satisfaction. The patient knows to call the clinic with any problems, questions or concerns.  . The total time  spent in the appointment was 25 minutes and more than 50% was on counseling and direct patient  cares.     Sullivan Lone MD [place] AAHIVMS [facility] Hematology/Oncology Physician [facility]  (Office):       [phone] (Work cell):  [phone] (Fax):           [phone]  02/06/2018 9:55 AM  This document serves as a record of services personally performed by Sullivan Lone, MD. It was created on his behalf by Baldwin Jamaica, a trained medical scribe. The creation of this record is based on the scribe's personal observations and the provider's statements to them.   .I have reviewed the above documentation for accuracy and completeness, and I agree with the above. Brunetta Genera MD MS

## 2018-02-06 ENCOUNTER — Telehealth: Payer: Self-pay

## 2018-02-06 ENCOUNTER — Inpatient Hospital Stay: Payer: Medicare Other | Attending: Hematology | Admitting: Hematology

## 2018-02-06 ENCOUNTER — Inpatient Hospital Stay: Payer: Medicare Other

## 2018-02-06 VITALS — BP 181/86 | HR 83 | Temp 99.1°F | Resp 18 | Ht 60.0 in | Wt 182.6 lb

## 2018-02-06 DIAGNOSIS — E538 Deficiency of other specified B group vitamins: Secondary | ICD-10-CM

## 2018-02-06 DIAGNOSIS — E119 Type 2 diabetes mellitus without complications: Secondary | ICD-10-CM | POA: Diagnosis not present

## 2018-02-06 DIAGNOSIS — Z8542 Personal history of malignant neoplasm of other parts of uterus: Secondary | ICD-10-CM | POA: Diagnosis not present

## 2018-02-06 DIAGNOSIS — Z90710 Acquired absence of both cervix and uterus: Secondary | ICD-10-CM | POA: Insufficient documentation

## 2018-02-06 DIAGNOSIS — E785 Hyperlipidemia, unspecified: Secondary | ICD-10-CM | POA: Insufficient documentation

## 2018-02-06 DIAGNOSIS — D509 Iron deficiency anemia, unspecified: Secondary | ICD-10-CM | POA: Diagnosis not present

## 2018-02-06 DIAGNOSIS — Z809 Family history of malignant neoplasm, unspecified: Secondary | ICD-10-CM | POA: Insufficient documentation

## 2018-02-06 DIAGNOSIS — Z803 Family history of malignant neoplasm of breast: Secondary | ICD-10-CM | POA: Diagnosis not present

## 2018-02-06 DIAGNOSIS — C9110 Chronic lymphocytic leukemia of B-cell type not having achieved remission: Secondary | ICD-10-CM | POA: Diagnosis not present

## 2018-02-06 DIAGNOSIS — Z79899 Other long term (current) drug therapy: Secondary | ICD-10-CM | POA: Insufficient documentation

## 2018-02-06 DIAGNOSIS — R634 Abnormal weight loss: Secondary | ICD-10-CM | POA: Insufficient documentation

## 2018-02-06 DIAGNOSIS — R05 Cough: Secondary | ICD-10-CM

## 2018-02-06 DIAGNOSIS — E611 Iron deficiency: Secondary | ICD-10-CM

## 2018-02-06 DIAGNOSIS — I10 Essential (primary) hypertension: Secondary | ICD-10-CM

## 2018-02-06 DIAGNOSIS — Z7984 Long term (current) use of oral hypoglycemic drugs: Secondary | ICD-10-CM | POA: Insufficient documentation

## 2018-02-06 DIAGNOSIS — R0982 Postnasal drip: Secondary | ICD-10-CM | POA: Insufficient documentation

## 2018-02-06 DIAGNOSIS — R197 Diarrhea, unspecified: Secondary | ICD-10-CM | POA: Insufficient documentation

## 2018-02-06 DIAGNOSIS — R5383 Other fatigue: Secondary | ICD-10-CM

## 2018-02-06 DIAGNOSIS — K219 Gastro-esophageal reflux disease without esophagitis: Secondary | ICD-10-CM | POA: Diagnosis not present

## 2018-02-06 DIAGNOSIS — Z7982 Long term (current) use of aspirin: Secondary | ICD-10-CM | POA: Insufficient documentation

## 2018-02-06 LAB — CMP (CANCER CENTER ONLY)
ALBUMIN: 4.1 g/dL (ref 3.5–5.0)
ALT: 15 U/L (ref 0–55)
AST: 17 U/L (ref 5–34)
Alkaline Phosphatase: 107 U/L (ref 40–150)
Anion gap: 11 (ref 3–11)
BUN: 26 mg/dL (ref 7–26)
CHLORIDE: 107 mmol/L (ref 98–109)
CO2: 24 mmol/L (ref 22–29)
Calcium: 9.9 mg/dL (ref 8.4–10.4)
Creatinine: 1.23 mg/dL — ABNORMAL HIGH (ref 0.60–1.10)
GFR, Est AFR Am: 51 mL/min — ABNORMAL LOW (ref >60)
GFR, Estimated: 44 mL/min — ABNORMAL LOW (ref >60)
GLUCOSE: 101 mg/dL (ref 70–140)
POTASSIUM: 4.6 mmol/L (ref 3.5–5.1)
Sodium: 142 mmol/L (ref 136–145)
Total Bilirubin: 0.5 mg/dL (ref 0.2–1.2)
Total Protein: 6.5 g/dL (ref 6.4–8.3)

## 2018-02-06 LAB — CBC WITH DIFFERENTIAL (CANCER CENTER ONLY)
Basophils Absolute: 0.1 10*3/uL (ref 0.0–0.1)
Basophils Relative: 1 %
Eosinophils Absolute: 0.3 10*3/uL (ref 0.0–0.5)
Eosinophils Relative: 3 %
HEMATOCRIT: 38.9 % (ref 34.8–46.6)
Hemoglobin: 12.1 g/dL (ref 11.6–15.9)
LYMPHS PCT: 31 %
Lymphs Abs: 2.8 10*3/uL (ref 0.9–3.3)
MCH: 27 pg (ref 25.1–34.0)
MCHC: 31.1 g/dL — AB (ref 31.5–36.0)
MCV: 86.8 fL (ref 79.5–101.0)
MONO ABS: 0.5 10*3/uL (ref 0.1–0.9)
MONOS PCT: 6 %
Neutro Abs: 5.5 10*3/uL (ref 1.5–6.5)
Neutrophils Relative %: 59 %
Platelet Count: 206 10*3/uL (ref 145–400)
RBC: 4.48 MIL/uL (ref 3.70–5.45)
RDW: 13.9 % (ref 11.2–14.5)
WBC Count: 9.2 10*3/uL (ref 3.9–10.3)

## 2018-02-06 LAB — VITAMIN B12: Vitamin B-12: 933 pg/mL — ABNORMAL HIGH (ref 180–914)

## 2018-02-06 LAB — RETICULOCYTES
RBC.: 4.48 MIL/uL (ref 3.70–5.45)
RETIC COUNT ABSOLUTE: 80.6 10*3/uL (ref 33.7–90.7)
Retic Ct Pct: 1.8 % (ref 0.7–2.1)

## 2018-02-06 LAB — FERRITIN: FERRITIN: 35 ng/mL (ref 9–269)

## 2018-02-06 LAB — IRON AND TIBC
Iron: 60 ug/dL (ref 41–142)
Saturation Ratios: 15 % — ABNORMAL LOW (ref 21–57)
TIBC: 394 ug/dL (ref 236–444)
UIBC: 334 ug/dL

## 2018-02-06 LAB — LACTATE DEHYDROGENASE: LDH: 189 U/L (ref 125–245)

## 2018-02-06 NOTE — Telephone Encounter (Signed)
Printed avs and calender of upcoming appointments. Per 4/8 los patient requested day for return visit.

## 2018-02-08 ENCOUNTER — Other Ambulatory Visit: Payer: Self-pay | Admitting: Cardiovascular Disease

## 2018-02-08 MED FILL — MELOXICAM 15 MG TABLET: 15 | 30 days supply | Qty: 30 | Fill #1

## 2018-02-13 MED FILL — METOPROLOL SUCCINATE ER 25: 25 | 90 days supply | Qty: 90 | Fill #0

## 2018-02-14 DIAGNOSIS — E119 Type 2 diabetes mellitus without complications: Secondary | ICD-10-CM | POA: Diagnosis not present

## 2018-02-14 DIAGNOSIS — H33012 Retinal detachment with single break, left eye: Secondary | ICD-10-CM | POA: Diagnosis not present

## 2018-02-15 ENCOUNTER — Other Ambulatory Visit: Payer: Self-pay | Admitting: Internal Medicine

## 2018-02-15 MED FILL — glipiZIDE ER 2.5 MG TB24: 2.5 | 90 days supply | Qty: 90 | Fill #0

## 2018-02-22 MED FILL — clonazePAM 0.5 MG TABS: 0.5 | 30 days supply | Qty: 90 | Fill #2

## 2018-03-09 ENCOUNTER — Other Ambulatory Visit: Payer: Self-pay | Admitting: Internal Medicine

## 2018-03-09 ENCOUNTER — Other Ambulatory Visit: Payer: Self-pay | Admitting: Family Medicine

## 2018-03-09 MED FILL — METFORMIN HCL ER 500 MG TAB: 500 | 90 days supply | Qty: 360 | Fill #0

## 2018-03-09 MED FILL — SIMVASTATIN 40 MG TABLET: 40 | 90 days supply | Qty: 90 | Fill #2

## 2018-03-09 MED FILL — MELOXICAM 15 MG TABLET: 15 | 30 days supply | Qty: 30 | Fill #0

## 2018-03-10 ENCOUNTER — Encounter: Payer: Self-pay | Admitting: Internal Medicine

## 2018-03-10 ENCOUNTER — Ambulatory Visit: Payer: Self-pay

## 2018-03-10 DIAGNOSIS — E119 Type 2 diabetes mellitus without complications: Secondary | ICD-10-CM | POA: Diagnosis not present

## 2018-03-10 LAB — HM DIABETES EYE EXAM

## 2018-03-13 ENCOUNTER — Ambulatory Visit (INDEPENDENT_AMBULATORY_CARE_PROVIDER_SITE_OTHER): Payer: Medicare Other | Admitting: Family Medicine

## 2018-03-13 VITALS — BP 150/80 | HR 75 | Temp 99.0°F | Ht 60.0 in | Wt 183.4 lb

## 2018-03-13 DIAGNOSIS — E1165 Type 2 diabetes mellitus with hyperglycemia: Secondary | ICD-10-CM | POA: Diagnosis not present

## 2018-03-13 DIAGNOSIS — Z0000 Encounter for general adult medical examination without abnormal findings: Secondary | ICD-10-CM | POA: Diagnosis not present

## 2018-03-13 DIAGNOSIS — F411 Generalized anxiety disorder: Secondary | ICD-10-CM

## 2018-03-13 DIAGNOSIS — C9110 Chronic lymphocytic leukemia of B-cell type not having achieved remission: Secondary | ICD-10-CM

## 2018-03-13 DIAGNOSIS — F419 Anxiety disorder, unspecified: Secondary | ICD-10-CM

## 2018-03-13 DIAGNOSIS — I10 Essential (primary) hypertension: Secondary | ICD-10-CM | POA: Diagnosis not present

## 2018-03-13 DIAGNOSIS — E782 Mixed hyperlipidemia: Secondary | ICD-10-CM

## 2018-03-13 DIAGNOSIS — C9190 Lymphoid leukemia, unspecified not having achieved remission: Secondary | ICD-10-CM

## 2018-03-13 MED ORDER — ALPRAZOLAM 1 MG PO TABS: ORAL_TABLET | ORAL | 3 refills | Status: DC

## 2018-03-13 MED ORDER — BENAZEPRIL-HYDROCHLOROTHIAZIDE 10-12.5 MG PO TABS: 1.0000 | ORAL_TABLET | Freq: Every day | ORAL | 3 refills | Status: DC

## 2018-03-13 NOTE — Assessment & Plan Note (Signed)
The patients weight, height, BMI and visual acuity have been recorded in the chart I have made referrals, counseling and provided education to the patient based review of the above and I have provided the pt with a written personalized care plan for preventive services.

## 2018-03-13 NOTE — Assessment & Plan Note (Signed)
Followed by oncology.  She is doing well.  Has follow up in July.

## 2018-03-13 NOTE — Assessment & Plan Note (Signed)
Deteriorated. Increase benazepril hctz to 10/12.5 mg daily. 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 The patient indicates understanding of these issues and agrees with the plan.

## 2018-03-13 NOTE — Assessment & Plan Note (Signed)
Continues to improve.  Followed by endo. No changes made to rxs today.

## 2018-03-13 NOTE — Assessment & Plan Note (Signed)
Continue current rx. Since she has recently had a lot of blood work other than lipid panel done with oncology, we agreed to not to blood work today and allow Dr. Rockey Situ to check her lipid panel when she sees him next month.

## 2018-03-13 NOTE — Assessment & Plan Note (Signed)
Deteriorated. Likely situational.  Discussed tx options.  Has been on klonipin prn for years.  Will d/c this.  Rx given to pt for as needed xanax, continue wellbutrin.  She will keep me updated.

## 2018-03-13 NOTE — Patient Instructions (Signed)
Great to see you.  We are starting xanax- please stop taking Klonipin.

## 2018-03-13 NOTE — Progress Notes (Signed)
Subjective:   Patient ID: Brenda [name], female    DOB: 1951/01/31, 67 y.o.   MRN: [id number]  Brenda [name] is a pleasant 67 y.o. year old female who presents to clinic today with OTHER and Follow-up  on 03/13/2018  HPI:  I have personally reviewed the Medicare Annual Wellness questionnaire and have noted 1. The patient's medical and social history 2. Their use of alcohol, tobacco or illicit drugs 3. Their current medications and supplements 4. The patient's functional ability including ADL's, fall risks, home safety risks and hearing or visual             impairment. 5. Diet and physical activities 6. Evidence for depression or mood disorders  End of life wishes discussed and updated in Social History.  The roster of all physicians providing medical care to patient - is listed in the CareTeams section of the chart.  Health Maintenance  Topic Date Due  . INFLUENZA VACCINE  06/01/2018  . HEMOGLOBIN A1C  07/23/2018  . MAMMOGRAM  08/24/2018  . OPHTHALMOLOGY EXAM  03/11/2019  . FOOT EXAM  03/14/2019  . TETANUS/TDAP  11/13/2019  . COLONOSCOPY  11/29/2019  . DEXA SCAN  Completed  . Hepatitis C Screening  Completed  . 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 Was seeing ortho.  Has received cortisone injections earlier this year which were ineffective and caused her blood sugars to spike. She did finally have a shoulder replacement.  DM-  Now followed by Dr. Cruzita Lederer.  Was last seen on 08/22/17.  Note reviewed- a1c has continued to improved. Lab Results  Component Value Date   HGBA1C 5.7 01/20/2018   Currently taking metformin 1000 mg twice daily and Glipizide 5 mg daily.  Wt Readings from Last 3 Encounters:  03/13/18 183 lb 6.4 oz (83.2 kg)  02/06/18 182 lb 9.6 oz (82.8 kg)  01/20/18 192 lb 6.4 oz (87.3 kg)    HLD- on Simvastatin. Denies myalgias.  Lab Results  Component Value Date   CHOL 162 03/07/2017   HDL 33.60 (L)  03/07/2017   LDLCALC 59 07/21/2016   LDLDIRECT 88.0 03/07/2017   TRIG 286.0 (H) 03/07/2017   CHOLHDL 5 03/07/2017    Anxiety/depression- has deteriorated with recently health stressors.  Currently taking  Wellbutrin XL 300 mg daily, and Klonipin (but tries to take it only when anxiety is severe and feels it is no longer working as well).  HTN-deteriorated and has been running high lately. Currently off norvasc which helped with edema and taking Benazapil 5-hctz 12.5 mg daily and BP has been running high.   Lab Results  Component Value Date   CREATININE 1.23 (H) 02/06/2018   CLL- followed by Dr. Irene Limbo. Was last seen on  02/06/18.  Note reviewed. Plan as follows:  PLAN  -reviewed 11/29/17 labs which showed Hgb  improved to 10.7. Cr levels much improved to 1.37 -I recommend oral sublingual B12 to help her levels.  -Last colonoscopy was 2011 and normal- I recommend she set up GI workup with [facility].  -Discussed her higher lymphocyte number, and that her signature is most consistent with Chronic Lymphocytic Leukemia unaccompanied by large amount of lymphocytes: Lymph Abs at 4.0k. Presenting with small lymph nodes.  -Biopsy at this point is probably not warranted; if genetic testing reveals Mantel Cell lymphoma then we will reconsider this.  -CD5 population identified -Discussed the constitutional symptoms that we will look for.  Wait and watch regarding her diagnosis with occasional scans and routine blood work.  -We noted that her anemia is likely not connected with her CLL given her history of iron-deficiency anemia.  -We advised Vitamin B12 108m daily under the tongue supplements and OTC ironpolysaccharide 1543mPO daily supplement -Her BP is high today (201/94), we're recommending that her PCP look into a possible need for adjusting her meds at this point. -We also recommend she and her PCP look into a GI w/u to evaluate a possible cause for her IDA. -Discussed pt labwork today  02/06/18; blood counts are stable, and Hgb has improved to 12.1, MCV is normal. Ferritin has dropped to 35. WBC have normalized to 9.2k, and Lymphs Abs have normalized to 2.8k.  -Continue PO iron for now, and consider IV iron replacement as necessary.  -Discussed that Mobic can increase her BP and present a risk of ulcerations.  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  -Discussed that the pt does not currently have any constitutional symptoms, has no significant bothers, and her labs are not worrisome. -In light of this, we will continue to watch the pt with routine labs.  -We would like to see pt back in 3-4 months with repeat scans.     Current Outpatient Medications on File Prior to Visit  Medication Sig Dispense Refill  . aspirin 81 MG tablet Take 81 mg by mouth daily.    . Blood Glucose Monitoring Suppl (FREESTYLE FREEDOM LITE) w/Device KIT Use to check blood sugar twice a day, E11.65 1 each 1  . buPROPion (WELLBUTRIN XL) 300 MG 24 hr tablet Take 1 tablet (300 mg total) by mouth daily. 90 tablet 3  . cholecalciferol (VITAMIN D) 1000 UNITS tablet Take 2,000 Units by mouth daily.     . Cyanocobalamin (B-12) 1000 MCG SUBL Place 1 tablet under the tongue daily.    . Esomeprazole Magnesium (NEXIUM PO) Take 22.5 mg by mouth daily.    . Marland KitchenlipiZIDE (GLUCOTROL XL) 2.5 MG 24 hr tablet Take 1 tablet (2.5 mg total) by mouth daily with supper. 90 tablet 3  . glucose blood (FREESTYLE LITE) test strip Use to check blood sugar twice a day, E11.65 200 each 12  . iron polysaccharides (NIFEREX) 150 MG capsule Take 150 mg by mouth daily.    . Lancets (FREESTYLE) lancets Use to check blood sugar twice a day, E11.65 200 each 12  . meloxicam (MOBIC) 15 MG tablet TAKE 1 TABLET BY MOUTH DAILY. 30 tablet 0  . metFORMIN (GLUCOPHAGE-XR) 500 MG 24 hr tablet TAKE 2 TABLETS BY MOUTH 2 TIMES DAILY WITH A MEAL. 360 tablet 0  . metoprolol succinate (TOPROL-XL) 25 MG 24 hr tablet TAKE 1 TABLET  (25 MG TOTAL) BY MOUTH DAILY. 90 tablet 0  . Multiple Vitamins-Minerals (HM MULTIVITAMIN ADULT GUMMY PO) Take 1 tablet by mouth daily.    . simvastatin (ZOCOR) 40 MG tablet Take 40 mg by mouth daily.    . Marland KitchenNIFINE PENTIPS 31G X 8 MM MISC USE AS DIRECTED WITH VICTOZA 100 each 88  . Calcium-Magnesium-Vitamin D (CALCIUM 500 PO) Take by mouth 2 (two) times daily.    . Marland KitchenlipiZIDE (GLUCOTROL XL) 2.5 MG 24 hr tablet TAKE 1 TABLET BEFORE DINNER (Patient not taking: Reported on 03/13/2018) 90 tablet 0   No current facility-administered medications on file prior to visit.     Allergies  Allergen Reactions  . Gabapentin Other (See Comments)    confusion  Past Medical History:  Diagnosis Date  . Anxiety   . Arthritis   . Depression    Wellbutrin  . Diabetes mellitus    Type II  . Endometrial ca ([place])    1998  . 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 GERD (gastroesophageal reflux disease)   . Heart murmur    patient states "cardiologist has not heard heart murmur for past several years"  . Heel spur   . Hyperlipidemia   . Hypertension   . Left leg swelling    "swelling in left leg from knee down when sitting for prolonged periods or being on feet for a long period of time"  . PONV (postoperative nausea and vomiting)    after hysterectomy  . Renal insufficiency     Past Surgical History:  Procedure Laterality Date  . ABDOMINAL HYSTERECTOMY  1998  . COLONOSCOPY    . EYE SURGERY Bilateral    cataracts  . EYE SURGERY Left    retina tear  . FEMUR IM NAIL  10/25/2012   Procedure: INTRAMEDULLARY (IM) NAIL FEMORAL;  Surgeon: Mauri Pole, MD;  Location: [facility];  Service: Orthopedics;  Laterality: Left;  . HIP SURGERY    . left knee arthroscopy  12/2010  . REFRACTIVE SURGERY    . TOTAL KNEE ARTHROPLASTY  12/20/2011   Procedure: TOTAL KNEE ARTHROPLASTY;  Surgeon: Gearlean Alf, MD;  Location: [facility];  Service: Orthopedics;  Laterality: Left;   Failed attempt at spinal   . TOTAL SHOULDER ARTHROPLASTY Right 08/19/2016   Procedure: RIGHT TOTAL SHOULDER ARTHROPLASTY;  Surgeon: Justice Britain, MD;  Location: [facility];  Service: Orthopedics;  Laterality: Right;    Family History  Problem Relation Age of Onset  . Cancer Mother        breast cancer  . Cancer Father        plasma sarcoma  . Breast cancer Neg Hx     Social History   Socioeconomic History  . Marital status: Single    Spouse name: Not on file  . Number of children: Not on file  . Years of education: Not on file  . Highest education level: Not on file  Occupational History  . 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 Financial resource strain: Not on file  . Food insecurity:    Worry: Not on file    Inability: Not on file  . Transportation needs:    Medical: Not on file    Non-medical: Not on file  Tobacco Use  . Smoking status: Never Smoker  . Smokeless tobacco: Never Used  Substance and Sexual Activity  . Alcohol use: No  . Drug use: No  . Sexual activity: Not on file  Lifestyle  . Physical activity:    Days per week: Not on file    Minutes per session: Not on file  . Stress: Not on file  Relationships  . Social connections:    Talks on phone: Not on file    Gets together: Not on file    Attends religious service: Not on file    Active member of club or organization: Not on file    Attends meetings of clubs or organizations: Not on file    Relationship status: Not on file  . Intimate partner violence:    Fear of current or ex partner: Not on file  Emotionally abused: Not on file    Physically abused: Not on file    Forced sexual activity: Not on file  Other Topics Concern  . 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    Review of Systems   Constitutional: Negative.   HENT: Negative.   Eyes: Negative.   Respiratory: Negative.   Cardiovascular: Negative.   Gastrointestinal: Negative.   Endocrine: Negative.   Genitourinary: Negative.   Musculoskeletal: Positive for arthralgias. Negative for myalgias, neck pain and neck stiffness.  Skin: Negative.   Allergic/Immunologic: Negative.   Neurological: Negative.   Hematological: Negative.   Psychiatric/Behavioral: Positive for sleep disturbance. Negative for agitation, behavioral problems, confusion, decreased concentration, dysphoric mood, hallucinations, self-injury and suicidal ideas. The patient is nervous/anxious. The patient is not hyperactive.      Objective:    BP (!) 150/80 (BP Location: Left Arm, Patient Position: Sitting, Cuff Size: Normal)   Pulse 75   Temp 99 F (37.2 C) (Oral)   Ht 5' (1.524 m)   Wt 183 lb 6.4 oz (83.2 kg)   SpO2 97%   BMI 35.82 kg/m   Wt Readings from Last 3 Encounters:  03/13/18 183 lb 6.4 oz (83.2 kg)  02/06/18 182 lb 9.6 oz (82.8 kg)  01/20/18 192 lb 6.4 oz (87.3 kg)     Physical Exam  Constitutional: She is oriented to person, place, and time. She appears well-developed and well-nourished. No distress.  HENT:  Head: Normocephalic and atraumatic.  Eyes: Conjunctivae are normal.  Neck: Normal range of motion.  Cardiovascular: Normal rate and regular rhythm.  Pulmonary/Chest: Effort normal and breath sounds normal. No respiratory distress.  Abdominal: Soft. Bowel sounds are normal. She exhibits no distension. There is no tenderness.  Musculoskeletal: Normal range of motion. She exhibits no edema.  Neurological: She is alert and oriented to person, place, and time. No cranial nerve deficit.  Skin: Skin is warm and dry.  Psychiatric: She has a normal mood and affect. Her behavior is normal. Judgment normal.  Nursing note and vitals reviewed.      Assessment & Plan:   Type 2 diabetes mellitus with hyperglycemia, without  long-term current use of insulin (HCC)  CLL (chronic lymphocytic leukemia) ([place])  Medicare annual wellness visit, subsequent  Mixed hyperlipidemia  Essential hypertension  Anxiety  Generalized anxiety disorder No follow-ups on file.

## 2018-03-14 ENCOUNTER — Encounter: Payer: Self-pay | Admitting: Family Medicine

## 2018-03-14 ENCOUNTER — Telehealth: Payer: Self-pay

## 2018-03-14 MED FILL — BENAZEPRIL-HYDROCHLOROTHIAZ: 20-12.5 | 90 days supply | Qty: 90 | Fill #0

## 2018-03-14 NOTE — Telephone Encounter (Signed)
Called pharmacy and had Rx corrected per Dr Deborra Medina. Left VM for pt to inform that Rx was corrected and at pharmacy. TLG

## 2018-03-15 MED FILL — ALPRAZolam 1 MG TABS: 1 | 30 days supply | Qty: 60 | Fill #0

## 2018-04-03 ENCOUNTER — Encounter: Payer: Self-pay | Admitting: Cardiovascular Disease

## 2018-04-03 ENCOUNTER — Ambulatory Visit (INDEPENDENT_AMBULATORY_CARE_PROVIDER_SITE_OTHER): Payer: Medicare Other | Admitting: Cardiovascular Disease

## 2018-04-03 VITALS — BP 160/94 | HR 87 | Ht 61.0 in | Wt 180.0 lb

## 2018-04-03 DIAGNOSIS — I10 Essential (primary) hypertension: Secondary | ICD-10-CM | POA: Diagnosis not present

## 2018-04-03 DIAGNOSIS — N179 Acute kidney failure, unspecified: Secondary | ICD-10-CM | POA: Diagnosis not present

## 2018-04-03 DIAGNOSIS — C9110 Chronic lymphocytic leukemia of B-cell type not having achieved remission: Secondary | ICD-10-CM

## 2018-04-03 DIAGNOSIS — N189 Chronic kidney disease, unspecified: Secondary | ICD-10-CM | POA: Diagnosis not present

## 2018-04-03 DIAGNOSIS — E782 Mixed hyperlipidemia: Secondary | ICD-10-CM

## 2018-04-03 DIAGNOSIS — I25118 Atherosclerotic heart disease of native coronary artery with other forms of angina pectoris: Secondary | ICD-10-CM

## 2018-04-03 DIAGNOSIS — E1165 Type 2 diabetes mellitus with hyperglycemia: Secondary | ICD-10-CM

## 2018-04-03 DIAGNOSIS — C9190 Lymphoid leukemia, unspecified not having achieved remission: Secondary | ICD-10-CM

## 2018-04-03 MED ORDER — DOXAZOSIN MESYLATE 2 MG PO TABS: 2.0000 mg | ORAL_TABLET | Freq: Every day | ORAL | 6 refills | Status: DC

## 2018-04-03 MED FILL — DOXAZOSIN MESYLATE 2 MG TAB: 2 | 30 days supply | Qty: 30 | Fill #0

## 2018-04-03 NOTE — Progress Notes (Signed)
Patient ID: Brenda [name], female   DOB: 01/30/1951, 67 y.o.   MRN: [id number] Cardiology Office Note  Date:  04/03/2018   ID:  Brenda [name], Brenda [name] 21-Apr-1951, MRN [id number]  PCP:  Lucille Passy, MD   Chief Complaint  Patient presents with  . other    12 month follow up. Meds reviewed by the pt. verbally. Pt. c/o shortness of breath and blood pressure flutuating.     HPI:  Brenda [name] is a  67 -year-old woman with history of coronary artery disease, CT coronary calcium score 1031 obesity, diabetes, poorly controlled, hemoglobin A1c previously of 12 down to 5.7 hyperlipidemia,  with previous stress test and echocardiogram for abnormal EKG,  who presents for routine followup of her coronary artery disease and hyperlipidemia.  In the hospital 11/2017 Hospital records reviewed with the patient in detail Dehydrated Lung nodules, enlarged lymph nodes Anemic  HBG 6.6 after hydration, iron def, stool negative BP was low. Acute renal failure  Reports recent diagnosis of Chronic lymphocytic leukemia  Benazepril/HCTZ stopped the time of discharge for hypotension  HBA1C 5.7  In 12/2017 victoza and glypizide decreased   blood pressure has been running high  Back on benazepril HCTZ  CR 1.23  Blood pressure at work:  115/60  Up to 140/80 avg 140s/80  swelling in her left leg greater than right leg, chronic issue  s/p shoulder replacement surgery  no regular exercise program  EKG personally reviewed by myself on todays visit shows normal sinus rhythm with rate 87 bpm, no significant ST or T-wave changes   other past medical history reviewed  CT coronary calcium score showing diffuse LAD disease, RCA disease Dramatically less left circumflex disease   PMH:   has a past medical history of Anxiety, Arthritis, Depression, Diabetes mellitus, Endometrial ca ([place]), Foot fracture, left, GERD (gastroesophageal reflux disease), Heart murmur, Heel spur, Hyperlipidemia, Hypertension,  Left leg swelling, PONV (postoperative nausea and vomiting), and Renal insufficiency.  PSH:    Past Surgical History:  Procedure Laterality Date  . ABDOMINAL HYSTERECTOMY  1998  . COLONOSCOPY    . EYE SURGERY Bilateral    cataracts  . EYE SURGERY Left    retina tear  . FEMUR IM NAIL  10/25/2012   Procedure: INTRAMEDULLARY (IM) NAIL FEMORAL;  Surgeon: Mauri Pole, MD;  [facility];  Service: Orthopedics;  Laterality: Left;  . HIP SURGERY    . left knee arthroscopy  12/2010  . REFRACTIVE SURGERY    . TOTAL KNEE ARTHROPLASTY  12/20/2011   Procedure: TOTAL KNEE ARTHROPLASTY;  Surgeon: Gearlean Alf, MD;  [facility];  Service: Orthopedics;  Laterality: Left;  Failed attempt at spinal   . TOTAL SHOULDER ARTHROPLASTY Right 08/19/2016   Procedure: RIGHT TOTAL SHOULDER ARTHROPLASTY;  Surgeon: Justice Britain, MD;  Location: [facility];  Service: Orthopedics;  Laterality: Right;    Current Outpatient Medications  Medication Sig Dispense Refill  . ALPRAZolam (XANAX) 1 MG tablet 1/2- 1 tablet twice daily as needed (Patient taking differently: 1/2- 1 tablet twice daily as needed) 60 tablet 3  . aspirin 81 MG tablet Take 81 mg by mouth daily.    . benazepril-hydrochlorthiazide (LOTENSIN HCT) 20-12.5 MG tablet Take 1 tablet by mouth daily.  3  . Blood Glucose Monitoring Suppl (FREESTYLE FREEDOM LITE) w/Device KIT Use to check blood sugar twice a day, E11.65 1 each 1  . buPROPion (WELLBUTRIN XL) 300 MG 24 hr tablet Take 1 tablet (300 mg total)  by mouth daily. 90 tablet 3  . Calcium-Magnesium-Vitamin D (CALCIUM 500 PO) Take by mouth 2 (two) times daily.    . cholecalciferol (VITAMIN D) 1000 UNITS tablet Take 2,000 Units by mouth daily.     . Cyanocobalamin (B-12) 1000 MCG SUBL Place 1 tablet under the tongue daily.    . Esomeprazole Magnesium (NEXIUM PO) Take 22.5 mg by mouth daily.    Marland Kitchen glipiZIDE (GLUCOTROL XL) 2.5 MG 24 hr tablet Take 1 tablet (2.5 mg total) by mouth daily with supper. 90  tablet 3  . iron polysaccharides (NIFEREX) 150 MG capsule Take 150 mg by mouth daily.    . Lancets (FREESTYLE) lancets Use to check blood sugar twice a day, E11.65 200 each 12  . meloxicam (MOBIC) 15 MG tablet TAKE 1 TABLET BY MOUTH DAILY. 30 tablet 0  . metFORMIN (GLUCOPHAGE-XR) 500 MG 24 hr tablet TAKE 2 TABLETS BY MOUTH 2 TIMES DAILY WITH A MEAL. 360 tablet 0  . metoprolol succinate (TOPROL-XL) 25 MG 24 hr tablet TAKE 1 TABLET (25 MG TOTAL) BY MOUTH DAILY. 90 tablet 0  . Multiple Vitamins-Minerals (HM MULTIVITAMIN ADULT GUMMY PO) Take 1 tablet by mouth daily.    . simvastatin (ZOCOR) 40 MG tablet Take 40 mg by mouth daily.     No current facility-administered medications for this visit.     Allergies:   Amlodipine and Gabapentin   Social History:  The patient  reports that she has never smoked. She has never used smokeless tobacco. She reports that she does not drink alcohol or use drugs.   Family History:   family history includes Cancer in her father and mother.    Review of Systems: Review of Systems  Constitutional: Positive for malaise/fatigue.  Respiratory: Negative.   Cardiovascular: Positive for leg swelling.  Gastrointestinal: Negative.   Musculoskeletal: Negative.        Severe shoulder discomfort  Neurological: Negative.   Psychiatric/Behavioral: Negative.   All other systems reviewed and are negative.    PHYSICAL EXAM: VS:  BP (!) 160/94 (BP Location: Left Arm, Patient Position: Sitting, Cuff Size: Normal)   Pulse 87   Ht 5' 1"  (1.549 m)   Wt 180 lb (81.6 kg)   BMI 34.01 kg/m  , BMI Body mass index is 34.01 kg/m.  Constitutional:  oriented to person, place, and time. No distress. obese HENT:  Head: Normocephalic and atraumatic.  Eyes:  no discharge. No scleral icterus.  Neck: Normal range of motion. Neck supple. No JVD present.  Cardiovascular: Normal rate, regular rhythm, normal heart sounds and intact distal pulses. Exam reveals no gallop and no  friction rub. No edema No murmur heard. Pulmonary/Chest: Effort normal and breath sounds normal. No stridor. No respiratory distress.  no wheezes.  no rales.  no tenderness.  Abdominal: Soft.  no distension.  no tenderness.  Musculoskeletal: Normal range of motion.  no  tenderness or deformity.  Neurological:  normal muscle tone. Coordination normal. No atrophy Skin: Skin is warm and dry. No rash noted. not diaphoretic.  Psychiatric:  normal mood and affect. behavior is normal. Thought content normal.     Recent Labs: 11/19/2017: TSH 1.643 02/06/2018: ALT 15; BUN 26; Creatinine 1.23; Hemoglobin 12.1; Platelet Count 206; Potassium 4.6; Sodium 142    Lipid Panel Lab Results  Component Value Date   CHOL 162 03/07/2017   HDL 33.60 (L) 03/07/2017   LDLCALC 59 07/21/2016   TRIG 286.0 (H) 03/07/2017      Wt Readings from  Last 3 Encounters:  04/03/18 180 lb (81.6 kg)  03/13/18 183 lb 6.4 oz (83.2 kg)  02/06/18 182 lb 9.6 oz (82.8 kg)       ASSESSMENT AND PLAN:  Essential hypertension -  Blood pressure running high Recommended she add Cardura 1 mg daily for one week and if blood pressure does not improve up to 2 mg daily Continue other medications  Tachycardia -   continue metoprolol  The Toprol could be increased if needed, will stay at 25 mg daily for now  Type 2 diabetes mellitus with hyperglycemia, without long-term current use of insulin (HCC) Dramatic improvement in her diabetes numbers Dramatic weight loss Medications being weaned down by endocrinology  Obesity  encouraged continued exercise, careful diet management in an effort to lose weight. Overall doing well  HLD (hyperlipidemia) Tolerating simvastatin  Anemia Iron deficiency Hospital records reviewed in detail Being followed by hematology   Disposition:   F/U  12 months   Total encounter time more than 45 minutes  Greater than 50% was spent in counseling and coordination of care with the  patient    Orders Placed This Encounter  Procedures  . EKG 12-Lead   Signed, Esmond Plants, M.D., Ph.D. 04/03/2018  [place], Maish Vaya

## 2018-04-03 NOTE — Patient Instructions (Addendum)
Medication Instructions:   Please start cardura 1/2 pill daily Monitor blood pressure If still running high, increase up to a full pill daily 2 mg Continue to monitor blood pressure  Labwork:  We will order a fasting cholesterol panel   Testing/Procedures:  No further testing at this time   Follow-Up: It was a pleasure seeing you in the office today. Please call us if you have new issues that need to be addressed before your next appt.  [phone]  Your physician wants you to follow-up in: 6 months.  You will receive a reminder letter in the mail two months in advance. If you don't receive a letter, please call our office to schedule the follow-up appointment.  If you need a refill on your cardiac medications before your next appointment, please call your pharmacy.  For educational health videos Log in to : [email or web address] Or : [email or web address], password : [email or web address]

## 2018-04-04 ENCOUNTER — Encounter: Payer: Self-pay | Admitting: Hematology

## 2018-04-10 ENCOUNTER — Telehealth: Payer: Self-pay | Admitting: Cardiovascular Disease

## 2018-04-10 ENCOUNTER — Other Ambulatory Visit: Payer: Self-pay | Admitting: Family Medicine

## 2018-04-10 NOTE — Telephone Encounter (Signed)
Patient calling in regards to lipid panel Patient states she was given paperwork to take to [facility] for lipid panel bloodwork She has misplaced the paperwork and would like to know if she can pick up another for Wednesday when she comes for bloodwork  Please call to discuss

## 2018-04-10 NOTE — Telephone Encounter (Signed)
No answer. Left message, ok per DPR, that paperwork is not needed to go to [facility]. Advised she could go at any time, check in at the desk to the right, and to be fasting and to call back if any further questions.

## 2018-04-12 ENCOUNTER — Other Ambulatory Visit
Admission: RE | Admit: 2018-04-12 | Discharge: 2018-04-12 | Disposition: A | Discharge status: Home / Self Care | Payer: Medicare Other | Source: Ambulatory Visit | Attending: Cardiovascular Disease | Admitting: Cardiovascular Disease

## 2018-04-12 DIAGNOSIS — I25118 Atherosclerotic heart disease of native coronary artery with other forms of angina pectoris: Secondary | ICD-10-CM | POA: Insufficient documentation

## 2018-04-12 LAB — LIPID PANEL
Cholesterol: 168 mg/dL (ref 0–200)
HDL: 34 mg/dL — ABNORMAL LOW (ref >40)
LDL CALC: 85 mg/dL (ref 0–99)
Total CHOL/HDL Ratio: 4.9 RATIO
Triglycerides: 247 mg/dL — ABNORMAL HIGH (ref <150)
VLDL: 49 mg/dL — AB (ref 0–40)

## 2018-04-12 MED FILL — MELOXICAM 15 MG TABLET: 15 | 30 days supply | Qty: 30 | Fill #0

## 2018-04-14 ENCOUNTER — Encounter: Payer: Self-pay | Admitting: Cardiovascular Disease

## 2018-04-17 ENCOUNTER — Other Ambulatory Visit: Payer: Self-pay | Admitting: *Deleted

## 2018-04-17 ENCOUNTER — Telehealth: Payer: Self-pay | Admitting: *Deleted

## 2018-04-17 MED ORDER — EZETIMIBE 10 MG PO TABS: 10.0000 mg | ORAL_TABLET | Freq: Every day | ORAL | 3 refills | Status: DC

## 2018-04-17 MED FILL — EZETIMIBE 10 MG TABLET: 10 | 90 days supply | Qty: 90 | Fill #0

## 2018-04-17 NOTE — Telephone Encounter (Signed)
FYI Returned call to [employer].  Provided Radiology Centralized Scheduling number [id number] to assist with request to "schedule CT C/A/P w/o contrast on my day off".  Answered questions of she may schedule test and confirmed test is ordered without contrast.  No further questions at this time.

## 2018-04-24 MED FILL — ALPRAZolam 1 MG TABS: 1 | 30 days supply | Qty: 60 | Fill #1

## 2018-05-01 ENCOUNTER — Other Ambulatory Visit: Payer: Self-pay | Admitting: Family Medicine

## 2018-05-01 ENCOUNTER — Encounter (HOSPITAL_COMMUNITY): Payer: Self-pay

## 2018-05-01 ENCOUNTER — Ambulatory Visit (HOSPITAL_COMMUNITY)
Admission: RE | Admit: 2018-05-01 | Discharge: 2018-05-01 | Disposition: A | Discharge status: Home / Self Care | Payer: Medicare Other | Source: Ambulatory Visit | Attending: Hematology | Admitting: Hematology

## 2018-05-01 DIAGNOSIS — R591 Generalized enlarged lymph nodes: Secondary | ICD-10-CM | POA: Diagnosis not present

## 2018-05-01 DIAGNOSIS — C9190 Lymphoid leukemia, unspecified not having achieved remission: Secondary | ICD-10-CM | POA: Diagnosis not present

## 2018-05-01 DIAGNOSIS — R918 Other nonspecific abnormal finding of lung field: Secondary | ICD-10-CM | POA: Insufficient documentation

## 2018-05-01 DIAGNOSIS — C9110 Chronic lymphocytic leukemia of B-cell type not having achieved remission: Secondary | ICD-10-CM

## 2018-05-01 DIAGNOSIS — K8020 Calculus of gallbladder without cholecystitis without obstruction: Secondary | ICD-10-CM | POA: Diagnosis not present

## 2018-05-02 MED FILL — BUPROPION HCL XL 300 MG TAB: 300 | 90 days supply | Qty: 90 | Fill #0

## 2018-05-08 NOTE — Progress Notes (Signed)
HEMATOLOGY/ONCOLOGY CLINIC NOTE  Date of Service: 05/10/18    Patient Care Team: Lucille Passy, MD as PCP - General Rockey Situ Kathlene November, MD as Consulting Physician (Cardiology)  CHIEF COMPLAINTS/PURPOSE OF CONSULTATION:  F/u for continue mx of CLL  HISTORY OF PRESENTING ILLNESS:   Brenda [name] is a wonderful 67 y.o. female who was hospitalized on 11/17/17 for abdominal discomfort, nausea and anorexia.   Further workup showed elevated kidney function tests and CT of the abd and pelvis demonstrated pelvic lymphadenopathy and innumerable pulmonary nodulesin the bases concerning for metastases.  Today she is here for an outpatient follow up accompanied by her friend. She notes she has not felt her usual self in the past 2 months with fatigue and lack of appetite. She also noticed diarrhea that comes and goes and her energy level would continue to drop. She has lost 25-30 pounds since end of summer 2018. She will notice mucous with her stool but no bloody or black stool. She does not take any meds that she would associate with her diarrhea or lower appetite and she denies change in her chronic medications. She has been on Nexium for a long period of time. She notes she does not often get sick. Dr. Deborra Medina is her PCP. She was using 241m ibuprofen 2-3 times a day more often recently due to her hip pain.   She notes her mother had breast cancer and diagnosed at 569yo. Her father had a rare form of cancer who died 3 weeks after diagnosis in his 830s Her brother passed from non-alcohol liver cirrhosis.   On review of symptoms, pt notes since her discharge she no longer has abdominal pain but will still have occasional diarrhea. She has not had diarrhea in several days. She notes to having a slight cough but mostly sinus drainage. Her appetite and eating has improved. She notes due to her knee replacement and hip fracture she will have swelling in her left leg. She has lower back pain at times. She  denies feeling lumps or bumps in her axilla b/l or groin. She denies dysuria or change in her urination.   Interval History:  CKEMONIE CUTILLOreturns today regarding her Chronic Lymphocytic Leukemia. The patient's last visit with [facility] on 02/06/18. The pt reports that she is doing well overall.   The pt reports that she doesn't have any new concerns. She has continued to take Iron Polysaccharide each day.   She takes 12.588mHCTZ and Mobic once a day. She also takes Metformin.   Of note since the patient's last visit, pt has had CT C/A/P completed on 05/01/18 with results revealing Mildly progressive lymphadenopathy in the chest, abdomen, and pelvis, corresponding to the patient's known CLL, as above. Dominant left external iliac node measures 3.3 cm short axis, previously 2.8cm. Spleen is normal in size. Numerous bilateral pulmonary nodules measuring up to 9 mm, grossly unchanged from 2017. Prior bilateral pleural effusions have resolved.  Lab results today (05/10/18) of CBC w/diff, CMP, and Reticulocytes is as follows: all values are WNL except for HGB at 11.4, Lymphs abs at 3.5k, Glucose at 113, BUN at 45, Creatinine at 1.79, GFR at 28. LDH 05/10/18 is WNL at 166 Ferritin 05/10/18 is 45 upfrom35 Vitamin B12 on 05/10/18 is 805  On review of systems, pt reports good energy levels, minimal leg swelling, and denies blood in the stools, concerns for bleeding, fevers, chills, night sweats, noticing any new lumps or bumps, abdominal  pains, and any other symptoms.   MEDICAL HISTORY:  Past Medical History:  Diagnosis Date  . Anxiety   . Arthritis   . Depression    Wellbutrin  . Diabetes mellitus    Type II  . Endometrial ca ([place])    1998  . 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 GERD (gastroesophageal reflux disease)   . Heart murmur    patient states "cardiologist has not heard heart murmur for past several years"  . Heel spur   . Hyperlipidemia   .  Hypertension   . Left leg swelling    "swelling in left leg from knee down when sitting for prolonged periods or being on feet for a long period of time"  . PONV (postoperative nausea and vomiting)    after hysterectomy  . Renal insufficiency     SURGICAL HISTORY: Past Surgical History:  Procedure Laterality Date  . ABDOMINAL HYSTERECTOMY  1998  . COLONOSCOPY    . EYE SURGERY Bilateral    cataracts  . EYE SURGERY Left    retina tear  . FEMUR IM NAIL  10/25/2012   Procedure: INTRAMEDULLARY (IM) NAIL FEMORAL;  Surgeon: Mauri Pole, MD;  Location: [facility];  Service: Orthopedics;  Laterality: Left;  . HIP SURGERY    . left knee arthroscopy  12/2010  . REFRACTIVE SURGERY    . TOTAL KNEE ARTHROPLASTY  12/20/2011   Procedure: TOTAL KNEE ARTHROPLASTY;  Surgeon: Gearlean Alf, MD;  Location: [facility];  Service: Orthopedics;  Laterality: Left;  Failed attempt at spinal   . TOTAL SHOULDER ARTHROPLASTY Right 08/19/2016   Procedure: RIGHT TOTAL SHOULDER ARTHROPLASTY;  Surgeon: Justice Britain, MD;  Location: [facility];  Service: Orthopedics;  Laterality: Right;    SOCIAL HISTORY: Social History   Socioeconomic History  . Marital status: Single    Spouse name: Not on file  . Number of children: Not on file  . Years of education: Not on file  . Highest education level: Not on file  Occupational History  . Occupation: Therapist, sports at [employer]  Social Needs  . Financial resource strain: Not on file  . Food insecurity:    Worry: Not on file    Inability: Not on file  . Transportation needs:    Medical: Not on file    Non-medical: Not on file  Tobacco Use  . Smoking status: Never Smoker  . Smokeless tobacco: Never Used  Substance and Sexual Activity  . Alcohol use: No  . Drug use: No  . Sexual activity: Not on file  Lifestyle  . Physical activity:    Days per week: Not on file    Minutes per session: Not on file  . Stress: Not on file  Relationships  .  Social connections:    Talks on phone: Not on file    Gets together: Not on file    Attends religious service: Not on file    Active member of club or organization: Not on file    Attends meetings of clubs or organizations: Not on file    Relationship status: Not on file  . Intimate partner violence:    Fear of current or ex partner: Not on file    Emotionally abused: Not on file    Physically abused: Not on file    Forced sexual activity: Not on file  Other Topics Concern  .  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 Cancer Mother        breast cancer  . Cancer Father        plasma sarcoma  . Breast cancer Neg Hx     ALLERGIES:  is allergic to amlodipine and gabapentin.  MEDICATIONS:  Current Outpatient Medications  Medication Sig Dispense Refill  . ALPRAZolam (XANAX) 1 MG tablet 1/2- 1 tablet twice daily as needed (Patient taking differently: 1/2- 1 tablet twice daily as needed) 60 tablet 3  . aspirin 81 MG tablet Take 81 mg by mouth daily.    . benazepril-hydrochlorthiazide (LOTENSIN HCT) 20-12.5 MG tablet Take 1 tablet by mouth daily.  3  . Blood Glucose Monitoring Suppl (FREESTYLE FREEDOM LITE) w/Device KIT Use to check blood sugar twice a day, E11.65 1 each 1  . buPROPion (WELLBUTRIN XL) 300 MG 24 hr tablet TAKE 1 TABLET BY MOUTH DAILY. 90 tablet 3  . Calcium-Magnesium-Vitamin D (CALCIUM 500 PO) Take by mouth 2 (two) times daily.    . cholecalciferol (VITAMIN D) 1000 UNITS tablet Take 2,000 Units by mouth daily.     . Cyanocobalamin (B-12) 1000 MCG SUBL Place 1 tablet under the tongue daily.    Marland Kitchen doxazosin (CARDURA) 2 MG tablet Take 1 tablet (2 mg total) by mouth daily. 30 tablet 6  . Esomeprazole Magnesium (NEXIUM PO) Take 22.5 mg by mouth daily.    Marland Kitchen ezetimibe (ZETIA) 10 MG tablet Take 1 tablet (10 mg total) by mouth daily. 90 tablet 3  . glipiZIDE (GLUCOTROL XL) 2.5 MG  24 hr tablet Take 1 tablet (2.5 mg total) by mouth daily with supper. 90 tablet 3  . iron polysaccharides (NIFEREX) 150 MG capsule Take 150 mg by mouth daily.    . Lancets (FREESTYLE) lancets Use to check blood sugar twice a day, E11.65 200 each 12  . meloxicam (MOBIC) 15 MG tablet TAKE 1 TABLET BY MOUTH DAILY. 30 tablet 5  . metFORMIN (GLUCOPHAGE-XR) 500 MG 24 hr tablet TAKE 2 TABLETS BY MOUTH 2 TIMES DAILY WITH A MEAL. 360 tablet 0  . metoprolol succinate (TOPROL-XL) 25 MG 24 hr tablet TAKE 1 TABLET (25 MG TOTAL) BY MOUTH DAILY. 90 tablet 0  . Multiple Vitamins-Minerals (HM MULTIVITAMIN ADULT GUMMY PO) Take 1 tablet by mouth daily.    . simvastatin (ZOCOR) 40 MG tablet Take 40 mg by mouth daily.     No current facility-administered medications for this visit.     REVIEW OF SYSTEMS:    A 10+ POINT REVIEW OF SYSTEMS WAS OBTAINED including neurology, dermatology, psychiatry, cardiac, respiratory, lymph, extremities, GI, GU, Musculoskeletal, constitutional, breasts, reproductive, HEENT.  All pertinent positives are noted in the HPI.  All others are negative.    PHYSICAL EXAMINATION: ECOG PERFORMANCE STATUS: 1 - Symptomatic but completely ambulatory  Vitals:   05/10/18 1039  BP: 140/63  Pulse: 89  Resp: 18  Temp: 97.9 F (36.6 C)  SpO2: 96%   Filed Weights   05/10/18 1039  Weight: 182 lb 11.2 oz (82.9 kg)   .Body mass index is 34.52 kg/m.  GENERAL:alert, in no acute distress and comfortable SKIN: no acute rashes, no significant lesions EYES: conjunctiva are pink and non-injected, sclera anicteric OROPHARYNX: MMM, no exudates, no oropharyngeal erythema or ulceration NECK: supple, no JVD LYMPH:  no palpable  lymphadenopathy in the cervical, axillary or inguinal regions LUNGS: clear to auscultation b/l with normal respiratory effort HEART: regular rate & rhythm ABDOMEN:  normoactive bowel sounds , non tender, not distended. No palpable hepatosplenomegaly.  Extremity: 1+ left  pedal edema, no right pedal edema PSYCH: alert & oriented x 3 with fluent speech NEURO: no focal motor/sensory deficits    LABORATORY DATA:  I have reviewed the data as listed  . CBC Latest Ref Rng & Units 05/10/2018 02/06/2018 11/29/2017  WBC 3.9 - 10.3 K/uL 7.4 9.2 11.8(H)  Hemoglobin 11.6 - 15.9 g/dL 11.4(L) 12.1 10.7(L)  Hematocrit 34.8 - 46.6 % 35.8 38.9 35.7  Platelets 145 - 400 K/uL 151 206 273  hgb 10.7 ANC 3.2k .Marland Kitchen CBC    Component Value Date/Time   WBC 7.4 05/10/2018 1009   WBC 11.8 (H) 11/28/2017 1127   RBC 4.14 05/10/2018 1009   RBC 4.14 05/10/2018 1009   HGB 11.4 (L) 05/10/2018 1009   HCT 35.8 05/10/2018 1009   PLT 151 05/10/2018 1009   MCV 86.5 05/10/2018 1009   MCH 27.5 05/10/2018 1009   MCHC 31.8 05/10/2018 1009   RDW 14.0 05/10/2018 1009   LYMPHSABS 3.5 (H) 05/10/2018 1009   MONOABS 0.4 05/10/2018 1009   EOSABS 0.3 05/10/2018 1009   BASOSABS 0.1 05/10/2018 1009    CMP Latest Ref Rng & Units 05/10/2018 02/06/2018 01/18/2018  Glucose 70 - 99 mg/dL 113(H) 101 88  BUN 8 - 23 mg/dL 45(H) 26 24  Creatinine 0.44 - 1.00 mg/dL 1.79(H) 1.23(H) 1.23(H)  Sodium 135 - 145 mmol/L 142 142 144  Potassium 3.5 - 5.1 mmol/L 4.4 4.6 4.9  Chloride 98 - 111 mmol/L 108 107 109  CO2 22 - 32 mmol/L _0 Calcium 8.9 - 10.3 mg/dL 9.6 9.9 9.8  Total Protein 6.5 - 8.1 g/dL 6.5 6.5 -  Total Bilirubin 0.3 - 1.2 mg/dL 0.5 0.5 -  Alkaline Phos 38 - 126 U/L 86 107 -  AST 15 - 41 U/L 15 17 -  ALT 0 - 44 U/L 18 15 -     12/15/17 FISH CLL Prognostic Panel:    RADIOGRAPHIC STUDIES: I have personally reviewed the radiological images as listed and agreed with the findings in the report. Ct Abdomen Pelvis Wo Contrast  Result Date: 05/01/2018 CLINICAL DATA:  CLL, diagnosed January 2019 EXAM: CT CHEST, ABDOMEN AND PELVIS WITHOUT CONTRAST TECHNIQUE: Multidetector CT imaging of the chest, abdomen and pelvis was performed following the standard protocol without IV contrast. COMPARISON:   PET-CT dated 12/10/2017 FINDINGS: CT CHEST FINDINGS Cardiovascular: Mild cardiomegaly.  Small pericardial effusion. No evidence of thoracic aortic aneurysm. Atherosclerotic calcifications of the aortic arch. Coronary atherosclerosis the LAD and right coronary artery. Mediastinum/Nodes: Thoracic lymphadenopathy, mildly progressive, including: --10 mm short axis left supraclavicular node (series 2/image 3), unchanged --17 mm short axis right axillary/subpectoral node (series 2/image 10), previously 11 mm --12 mm short axis right paratracheal node (series 2/image 13), previously 13 mm --15 mm short axis subcarinal node (series 2/image 25), previously 13 mm Visualized thyroid is unremarkable. Lungs/Pleura: Prior pleural effusions have resolved. Mild compressive atelectasis in the medial right lower lobe. Numerous bilateral pulmonary nodules, approximately 25-30 in number, measuring up to 9 mm in the anterior left lower lobe (series 6/image 93). Overall appearance is unchanged from 2017. No focal consolidation. No pleural effusion or pneumothorax. Musculoskeletal: Degenerative changes of the thoracic spine. CT ABDOMEN PELVIS FINDINGS Hepatobiliary: Unenhanced liver is unremarkable. Layering 13 mm gallstone. No  associated inflammatory changes. No intrahepatic extrahepatic ductal dilatation. Pancreas: Within normal limits. Spleen: Spleen is normal in size. Adrenals/Urinary Tract: Adrenal glands are within normal limits. Kidneys are within normal limits. No renal, ureteral, or bladder calculi. No hydronephrosis. Bladder is underdistended but unremarkable. Stomach/Bowel: Stomach is within normal limits. No evidence of bowel obstruction. Normal appendix (series 2/image 78). Vascular/Lymphatic: No evidence of abdominal aortic aneurysm. Atherosclerotic calcifications of the abdominal aorta and branch vessels. Abdominopelvic lymphadenopathy, mildly progressive, including: --14 mm short axis left para-aortic node (series 2/image  64), unchanged --3.3 cm short axis left external iliac node (series 2/image 101), previously 2.8 cm --2.4 cm short axis right deep inguinal node (series 2/image 99), previously 2.2 cm Reproductive: Status post hysterectomy. No adnexal masses. Other: No abdominopelvic ascites. Musculoskeletal: Degenerative changes of the lumbar spine. IMPRESSION: Mildly progressive lymphadenopathy in the chest, abdomen, and pelvis, corresponding to the patient's known CLL, as above. Dominant left external iliac node measures 3.3 cm short axis, previously 2.8 cm. Spleen is normal in size. Numerous bilateral pulmonary nodules measuring up to 9 mm, grossly unchanged from 2017. Prior bilateral pleural effusions have resolved. Electronically Signed   By: Julian Hy M.D.   On: 05/01/2018 11:28   Ct Chest Wo Contrast  Result Date: 05/01/2018 CLINICAL DATA:  CLL, diagnosed January 2019 EXAM: CT CHEST, ABDOMEN AND PELVIS WITHOUT CONTRAST TECHNIQUE: Multidetector CT imaging of the chest, abdomen and pelvis was performed following the standard protocol without IV contrast. COMPARISON:  PET-CT dated 12/10/2017 FINDINGS: CT CHEST FINDINGS Cardiovascular: Mild cardiomegaly.  Small pericardial effusion. No evidence of thoracic aortic aneurysm. Atherosclerotic calcifications of the aortic arch. Coronary atherosclerosis the LAD and right coronary artery. Mediastinum/Nodes: Thoracic lymphadenopathy, mildly progressive, including: --10 mm short axis left supraclavicular node (series 2/image 3), unchanged --17 mm short axis right axillary/subpectoral node (series 2/image 10), previously 11 mm --12 mm short axis right paratracheal node (series 2/image 13), previously 13 mm --15 mm short axis subcarinal node (series 2/image 25), previously 13 mm Visualized thyroid is unremarkable. Lungs/Pleura: Prior pleural effusions have resolved. Mild compressive atelectasis in the medial right lower lobe. Numerous bilateral pulmonary nodules, approximately  25-30 in number, measuring up to 9 mm in the anterior left lower lobe (series 6/image 93). Overall appearance is unchanged from 2017. No focal consolidation. No pleural effusion or pneumothorax. Musculoskeletal: Degenerative changes of the thoracic spine. CT ABDOMEN PELVIS FINDINGS Hepatobiliary: Unenhanced liver is unremarkable. Layering 13 mm gallstone. No associated inflammatory changes. No intrahepatic extrahepatic ductal dilatation. Pancreas: Within normal limits. Spleen: Spleen is normal in size. Adrenals/Urinary Tract: Adrenal glands are within normal limits. Kidneys are within normal limits. No renal, ureteral, or bladder calculi. No hydronephrosis. Bladder is underdistended but unremarkable. Stomach/Bowel: Stomach is within normal limits. No evidence of bowel obstruction. Normal appendix (series 2/image 78). Vascular/Lymphatic: No evidence of abdominal aortic aneurysm. Atherosclerotic calcifications of the abdominal aorta and branch vessels. Abdominopelvic lymphadenopathy, mildly progressive, including: --14 mm short axis left para-aortic node (series 2/image 64), unchanged --3.3 cm short axis left external iliac node (series 2/image 101), previously 2.8 cm --2.4 cm short axis right deep inguinal node (series 2/image 99), previously 2.2 cm Reproductive: Status post hysterectomy. No adnexal masses. Other: No abdominopelvic ascites. Musculoskeletal: Degenerative changes of the lumbar spine. IMPRESSION: Mildly progressive lymphadenopathy in the chest, abdomen, and pelvis, corresponding to the patient's known CLL, as above. Dominant left external iliac node measures 3.3 cm short axis, previously 2.8 cm. Spleen is normal in size. Numerous bilateral pulmonary nodules measuring up  to 9 mm, grossly unchanged from 2017. Prior bilateral pleural effusions have resolved. Electronically Signed   By: Julian Hy M.D.   On: 05/01/2018 11:28    ASSESSMENT & PLAN:  Brenda [name] is a 67 y.o. 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 Lab Results  Component Value Date   LDH 166 05/10/2018    2. Multiple pulmonary nodules   CT AP on 11/17/17 - with 1. Pelvic lymphadenopathy and innumerable pulmonary nodules in the lung bases, consistent with metastatic disease. 2. Cholelithiasis without CT evidence of acute cholecystitis. 3.  Aortic atherosclerosis  CT Chest on 11/20/17 - with multiple small lung nodules in LLL that appear stable from prior CT in 12/2015 and are considered benign and Upper lung nodules also appear benign. Also with several borderline enlarged lymph nodes concerning for a lymphoproliferative disorder   3. H/o endometrial cancer in 1998 -localized to uterus -treated with surgery, abdominal hysterectomy  -no evidence of recurrence  PLAN:  -Discussed pt labwork today, 05/10/18; Creatinine increased from 1.23 to 1.79, BUN increased to 45, HGB at 11.4, Lymphs abs at 3.5k -Discussed 05/01/18 CT C/A/P which revealed Mildly progressive lymphadenopathy in the chest, abdomen, and pelvis, corresponding to the patient's known CLL, as above. Dominant left external iliac node measures 3.3 cm short axis, previously 2.8cm. Spleen is normal in size. Numerous bilateral pulmonary nodules measuring up to 9 mm, grossly unchanged from 2017. Prior bilateral pleural effusions have resolved.  -Lymphadenopathy not indicative of treatment at this time -Recommended that the pt increase hydration to at least 48-64 oz of water with creatinine increase -Recommended repeating kidney functions with PCP in the next month -Discussed that Mobic could be an additional stressor on the kidneys, and Metformin could also be reconsidered  -Goal for Ferritin >100 with CKD    -Recommend PCP Dr Deborra Medina consider a GI workup with previous HGB drop coupled with iron deficiency in the setting of Mobic -There is no indication for treatment of her CLL at this time with minimal lab and radiographic progression noted, and no constitutional symptoms -Continue on 69m aspirin -Will see pt back in 6 months unless she develops any new concerns before then   4. Microcytic anemia - Fe deficiency  Presented in hospital with Hgb of 6.6 Hgb holding steady s/p 2U PRBC - Fe infusioncompleted 1/19- stool hemoccult negative - suspect this may be due to CKD, but iron deficiency also worrisome for occult GIB  -Tolerated IV iron well  . Lab Results  Component Value Date   IRON 66 05/10/2018   TIBC 411 05/10/2018   IRONPCTSAT 16 (L) 05/10/2018   (Iron and TIBC)  Lab Results  Component Value Date   FERRITIN 45 05/10/2018    5. Marginal B12 levels in hospital  (Antiparietal celll and anti IF Ab negative) -given one B12 injection in hospital  PLAN:  -I recommend oral sublingual B12 to help her levels.  -Last colonoscopy was 2011 and normal- I recommend she set up GI workup with [facility].  -CD5 population identified -Discussed the constitutional symptoms that we will look for. Wait and watch regarding her diagnosis with occasional scans and routine blood work.  -We noted that her anemia is likely not connected with her CLL given her history  of iron-deficiency anemia.  -We advised Vitamin B12 1034m daily under the tongue supplements and continue OTC ironpolysaccharide 1544mPO daily supplement -We also recommend she and her PCP look into a GI w/u to evaluate a possible cause for her IDA.   RTC with Dr KaIrene Limbon 6 months with labs    All of the patients questions were answered with apparent satisfaction. The patient knows to call the clinic with any problems, questions or concerns.  The total time spent in the appt was 20 minutes and more than 50% was on counseling and direct  patient cares.     GaSullivan LoneD MS AAHIVMS SCGreater Regional Medical CenterTCarolina Bone And Joint Surgery Centerematology/Oncology Physician CoTruecare Surgery Center LLC(Office):       [phone] cell):  [phone]):           [phone] 20787/08/2018 11:38 AM  I, ScBaldwin Jamaicaam acting as a scEducation administratoror Dr KaIrene Limbo  .I have reviewed the above documentation for accuracy and completeness, and I agree with the above. .GBrunetta GeneraD

## 2018-05-10 ENCOUNTER — Encounter: Payer: Self-pay | Admitting: Hematology

## 2018-05-10 ENCOUNTER — Inpatient Hospital Stay: Payer: Medicare Other | Attending: Hematology | Admitting: Hematology

## 2018-05-10 ENCOUNTER — Inpatient Hospital Stay: Payer: Medicare Other

## 2018-05-10 ENCOUNTER — Telehealth: Payer: Self-pay | Admitting: Hematology

## 2018-05-10 VITALS — BP 140/63 | HR 89 | Temp 97.9°F | Resp 18 | Ht 61.0 in | Wt 182.7 lb

## 2018-05-10 DIAGNOSIS — D509 Iron deficiency anemia, unspecified: Secondary | ICD-10-CM | POA: Insufficient documentation

## 2018-05-10 DIAGNOSIS — Z79899 Other long term (current) drug therapy: Secondary | ICD-10-CM

## 2018-05-10 DIAGNOSIS — E1122 Type 2 diabetes mellitus with diabetic chronic kidney disease: Secondary | ICD-10-CM

## 2018-05-10 DIAGNOSIS — M545 Low back pain: Secondary | ICD-10-CM | POA: Diagnosis not present

## 2018-05-10 DIAGNOSIS — I2510 Atherosclerotic heart disease of native coronary artery without angina pectoris: Secondary | ICD-10-CM | POA: Diagnosis not present

## 2018-05-10 DIAGNOSIS — Z7982 Long term (current) use of aspirin: Secondary | ICD-10-CM | POA: Insufficient documentation

## 2018-05-10 DIAGNOSIS — Z8542 Personal history of malignant neoplasm of other parts of uterus: Secondary | ICD-10-CM | POA: Insufficient documentation

## 2018-05-10 DIAGNOSIS — R197 Diarrhea, unspecified: Secondary | ICD-10-CM

## 2018-05-10 DIAGNOSIS — N189 Chronic kidney disease, unspecified: Secondary | ICD-10-CM | POA: Insufficient documentation

## 2018-05-10 DIAGNOSIS — Z7984 Long term (current) use of oral hypoglycemic drugs: Secondary | ICD-10-CM | POA: Diagnosis not present

## 2018-05-10 DIAGNOSIS — E611 Iron deficiency: Secondary | ICD-10-CM

## 2018-05-10 DIAGNOSIS — R5383 Other fatigue: Secondary | ICD-10-CM | POA: Insufficient documentation

## 2018-05-10 DIAGNOSIS — C9110 Chronic lymphocytic leukemia of B-cell type not having achieved remission: Secondary | ICD-10-CM | POA: Insufficient documentation

## 2018-05-10 DIAGNOSIS — I129 Hypertensive chronic kidney disease with stage 1 through stage 4 chronic kidney disease, or unspecified chronic kidney disease: Secondary | ICD-10-CM

## 2018-05-10 DIAGNOSIS — K219 Gastro-esophageal reflux disease without esophagitis: Secondary | ICD-10-CM | POA: Diagnosis not present

## 2018-05-10 DIAGNOSIS — E785 Hyperlipidemia, unspecified: Secondary | ICD-10-CM | POA: Insufficient documentation

## 2018-05-10 DIAGNOSIS — F418 Other specified anxiety disorders: Secondary | ICD-10-CM | POA: Diagnosis not present

## 2018-05-10 DIAGNOSIS — E538 Deficiency of other specified B group vitamins: Secondary | ICD-10-CM

## 2018-05-10 DIAGNOSIS — K8020 Calculus of gallbladder without cholecystitis without obstruction: Secondary | ICD-10-CM | POA: Diagnosis not present

## 2018-05-10 DIAGNOSIS — D479 Neoplasm of uncertain behavior of lymphoid, hematopoietic and related tissue, unspecified: Secondary | ICD-10-CM

## 2018-05-10 DIAGNOSIS — R630 Anorexia: Secondary | ICD-10-CM | POA: Insufficient documentation

## 2018-05-10 LAB — CMP (CANCER CENTER ONLY)
ALBUMIN: 4.3 g/dL (ref 3.5–5.0)
ALT: 18 U/L (ref 0–44)
AST: 15 U/L (ref 15–41)
Alkaline Phosphatase: 86 U/L (ref 38–126)
Anion gap: 11 (ref 5–15)
BILIRUBIN TOTAL: 0.5 mg/dL (ref 0.3–1.2)
BUN: 45 mg/dL — AB (ref 8–23)
CO2: 23 mmol/L (ref 22–32)
Calcium: 9.6 mg/dL (ref 8.9–10.3)
Chloride: 108 mmol/L (ref 98–111)
Creatinine: 1.79 mg/dL — ABNORMAL HIGH (ref 0.44–1.00)
GFR, Est AFR Am: 32 mL/min — ABNORMAL LOW (ref >60)
GFR, Estimated: 28 mL/min — ABNORMAL LOW (ref >60)
GLUCOSE: 113 mg/dL — AB (ref 70–99)
POTASSIUM: 4.4 mmol/L (ref 3.5–5.1)
SODIUM: 142 mmol/L (ref 135–145)
TOTAL PROTEIN: 6.5 g/dL (ref 6.5–8.1)

## 2018-05-10 LAB — LACTATE DEHYDROGENASE: LDH: 166 U/L (ref 98–192)

## 2018-05-10 LAB — CBC WITH DIFFERENTIAL (CANCER CENTER ONLY)
BASOS ABS: 0.1 10*3/uL (ref 0.0–0.1)
Basophils Relative: 1 %
EOS PCT: 4 %
Eosinophils Absolute: 0.3 10*3/uL (ref 0.0–0.5)
HEMATOCRIT: 35.8 % (ref 34.8–46.6)
Hemoglobin: 11.4 g/dL — ABNORMAL LOW (ref 11.6–15.9)
LYMPHS ABS: 3.5 10*3/uL — AB (ref 0.9–3.3)
LYMPHS PCT: 46 %
MCH: 27.5 pg (ref 25.1–34.0)
MCHC: 31.8 g/dL (ref 31.5–36.0)
MCV: 86.5 fL (ref 79.5–101.0)
MONO ABS: 0.4 10*3/uL (ref 0.1–0.9)
MONOS PCT: 6 %
Neutro Abs: 3.2 10*3/uL (ref 1.5–6.5)
Neutrophils Relative %: 43 %
PLATELETS: 151 10*3/uL (ref 145–400)
RBC: 4.14 MIL/uL (ref 3.70–5.45)
RDW: 14 % (ref 11.2–14.5)
WBC Count: 7.4 10*3/uL (ref 3.9–10.3)

## 2018-05-10 LAB — IRON AND TIBC
IRON: 66 ug/dL (ref 41–142)
Saturation Ratios: 16 % — ABNORMAL LOW (ref 21–57)
TIBC: 411 ug/dL (ref 236–444)
UIBC: 345 ug/dL

## 2018-05-10 LAB — RETICULOCYTES
RBC.: 4.14 MIL/uL (ref 3.70–5.45)
Retic Count, Absolute: 58 10*3/uL (ref 33.7–90.7)
Retic Ct Pct: 1.4 % (ref 0.7–2.1)

## 2018-05-10 LAB — VITAMIN B12: VITAMIN B 12: 805 pg/mL (ref 180–914)

## 2018-05-10 LAB — FERRITIN: FERRITIN: 45 ng/mL (ref 11–307)

## 2018-05-10 MED FILL — MELOXICAM 15 MG TABLET: 15 | 30 days supply | Qty: 30 | Fill #1

## 2018-05-10 MED FILL — DOXAZOSIN MESYLATE 2 MG TAB: 2 | 30 days supply | Qty: 30 | Fill #1

## 2018-05-10 NOTE — Telephone Encounter (Signed)
Appointments scheduled Calendar printed per 7/10 los

## 2018-05-12 ENCOUNTER — Other Ambulatory Visit: Payer: Self-pay | Admitting: Internal Medicine

## 2018-05-12 MED FILL — glipiZIDE ER 2.5 MG TB24: 2.5 | 90 days supply | Qty: 90 | Fill #0

## 2018-05-17 ENCOUNTER — Other Ambulatory Visit: Payer: Self-pay | Admitting: Cardiovascular Disease

## 2018-05-17 MED FILL — METOPROLOL SUCCINATE ER 25: 25 | 90 days supply | Qty: 90 | Fill #0

## 2018-05-24 ENCOUNTER — Encounter: Payer: Self-pay | Admitting: Internal Medicine

## 2018-05-24 ENCOUNTER — Ambulatory Visit (INDEPENDENT_AMBULATORY_CARE_PROVIDER_SITE_OTHER): Payer: Medicare Other | Admitting: Internal Medicine

## 2018-05-24 VITALS — BP 144/80 | HR 78 | Ht 61.0 in | Wt 185.0 lb

## 2018-05-24 DIAGNOSIS — E1165 Type 2 diabetes mellitus with hyperglycemia: Secondary | ICD-10-CM

## 2018-05-24 DIAGNOSIS — E782 Mixed hyperlipidemia: Secondary | ICD-10-CM | POA: Diagnosis not present

## 2018-05-24 DIAGNOSIS — Z6835 Body mass index (BMI) 35.0-35.9, adult: Secondary | ICD-10-CM

## 2018-05-24 DIAGNOSIS — I25118 Atherosclerotic heart disease of native coronary artery with other forms of angina pectoris: Secondary | ICD-10-CM | POA: Diagnosis not present

## 2018-05-24 LAB — POCT GLYCOSYLATED HEMOGLOBIN (HGB A1C): Hemoglobin A1C: 6.2 % — AB (ref 4.0–5.6)

## 2018-05-24 NOTE — Addendum Note (Signed)
Addended by: Drucilla Schmidt on: 05/24/2018 01:38 PM   Modules accepted: Orders

## 2018-05-24 NOTE — Patient Instructions (Signed)
Please continue: - Metformin ER 1000 mg 2x a day - Glipizide ER 2.5 mg before dinner  Please return in 4 months with your sugar log.

## 2018-05-24 NOTE — Progress Notes (Signed)
Patient ID: Brenda [name], female   DOB: 02-May-1951, 67 y.o.   MRN: [id number]  HPI: Brenda [name] is a 67 y.o.-year-old female, returning for follow-up for DM2, dx in 2007, non-insulin-dependent, uncontrolled, with complications (CKD). Last visit 4 months ago.  Last hemoglobin A1c was: Lab Results  Component Value Date   HGBA1C 5.7 01/20/2018   HGBA1C 6.5 08/22/2017   HGBA1C 7.5 05/13/2017   Pt is on a regimen of: - Metformin ER 1000 mg 2x a day - Glipizide ER 2.5 mg 2x a day before meals (could not crush the pill before dinner) >> no only before dinnerw   >> stopped 11/2017 cause of low blood sugars She was on Glipizide in 11/2015 after a steroid inj. At last visit, we stopped Glipizide 5 mg 2x a day, before meals, because of lows.  Pt checks her sugars 2 times a day: - am: 55, 99-152, 160 >>  52x1, 78-128 >> 83-139, 144, 162 - 2h after b'fast: 105, 129 >> n/c >> 156 >> n/c >> 148 - before lunch: 89 >> 117-131 >> 69-87 >> 74-119 - 2h after lunch: n/c >> 98 >> 118, 123 >> 92, 99 - before dinner:62-142, 157 173 >> 64-102, 121 >> 66, 71-127, 140 - 2h after dinner: 1180 >> n/c >> 138, 148 >> n/c - bedtime: 74, 80-170 >> 91-143 >> n/c - nighttime: n/c Lowest sugar was 37 x1 >> ...>> 52 >> 66 x1; she has hypoglycemia awareness in the 70s. Highest sugar was 260 x 1 >> 150 >> 162.  Glucometer: Freestyle Lite  Pt's meals are: - Breakfast: English muffin + preserve or cereal or yoghurt - snack: fruit or fruit + yoghurt - Lunch: 1/2 sandwich + chips + salsa + fruit/sugar free - Dinner: soup or chicken/steak + veggies, occasional starch or Lean cuisine or light salad  - +  CKD, last BUN/creatinine:  Lab Results  Component Value Date   BUN 45 (H) 05/10/2018   CREATININE 1.79 (H) 05/10/2018  On benazepril, changed from enalapril. - + HL; last set of lipids: Lab Results  Component Value Date   CHOL 168 04/12/2018   HDL 34 (L) 04/12/2018   LDLCALC 85 04/12/2018   LDLDIRECT  88.0 03/07/2017   TRIG 247 (H) 04/12/2018   CHOLHDL 4.9 04/12/2018  Off simvastatin because of leg cramps.  On Zetia now. - last eye exam 03/2018: No DR; she had cataract surgery in 2016.  She has a history of a retinal tear more than a year ago. -  she denies numbness and tingling in her feet. On Gabapentin for leg cramps at night. On ASA 8.  She also has HTN, GERD, depression.  She developed increased leg swelling due to amlodipine but this was stopped before last visit.  She continues on benazepril and HCTZ.  She was admitted 11/2017 for dehydration, anemia, and acute renal failure >> diagnosed with CLL.  ROS: Constitutional: no weight gain/no weight loss, no fatigue, no subjective hyperthermia, no subjective hypothermia Eyes: no blurry vision, no xerophthalmia ENT: no sore throat, no nodules palpated in throat, no dysphagia, no odynophagia, no hoarseness Cardiovascular: no CP/no SOB/no palpitations/no leg swelling Respiratory: no cough/no SOB/no wheezing Gastrointestinal: no N/no V/no D/no C/no acid reflux Musculoskeletal: + muscle aches/no joint aches Skin: no rashes, no hair loss Neurological: no tremors/no numbness/no tingling/no dizziness  I reviewed pt's medications, allergies, PMH, social hx, family hx, and changes were documented in the history of present illness. Otherwise, unchanged from my initial  visit note.  Since last visit, she started Zetia, Cardura, Xanax and stopped clonazepam.  Past Medical History:  Diagnosis Date  . Anxiety   . Arthritis   . Depression    Wellbutrin  . Diabetes mellitus    Type II  . Endometrial ca (HCC)    1998  . 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 GERD (gastroesophageal reflux disease)   . Heart murmur    patient states "cardiologist has not heard heart murmur for past several years"  . Heel spur   . Hyperlipidemia   . Hypertension   . Left leg swelling    "swelling in left leg from knee  down when sitting for prolonged periods or being on feet for a long period of time"  . PONV (postoperative nausea and vomiting)    after hysterectomy  . Renal insufficiency    Past Surgical History:  Procedure Laterality Date  . ABDOMINAL HYSTERECTOMY  1998  . COLONOSCOPY    . EYE SURGERY Bilateral    cataracts  . EYE SURGERY Left    retina tear  . FEMUR IM NAIL  10/25/2012   Procedure: INTRAMEDULLARY (IM) NAIL FEMORAL;  Surgeon: Matthew D Olin, MD;  Location: [facility];  Service: Orthopedics;  Laterality: Left;  . HIP SURGERY    . left knee arthroscopy  12/2010  . REFRACTIVE SURGERY    . TOTAL KNEE ARTHROPLASTY  12/20/2011   Procedure: TOTAL KNEE ARTHROPLASTY;  Surgeon: Frank V Aluisio, MD;  Location: [facility];  Service: Orthopedics;  Laterality: Left;  Failed attempt at spinal   . TOTAL SHOULDER ARTHROPLASTY Right 08/19/2016   Procedure: RIGHT TOTAL SHOULDER ARTHROPLASTY;  Surgeon: Kevin Supple, MD;  Location: [facility];  Service: Orthopedics;  Laterality: Right;   Social History   Social History  . Marital Status: Single    Spouse Name: N/A  . Number of Children: 0   Occupational History  . 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 Smoking status: Never Smoker   . Smokeless tobacco: Never Used  . Alcohol Use: No  . 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 ALPRAZolam (XANAX) 1 MG tablet 1/2- 1 tablet twice daily as needed (Patient taking differently: 1/2- 1 tablet twice daily as needed) 60 tablet 3  . aspirin 81 MG tablet Take 81 mg by mouth daily.    . benazepril-hydrochlorthiazide (LOTENSIN HCT) 20-12.5 MG tablet Take 1 tablet by mouth daily.  3  . Blood Glucose Monitoring Suppl (FREESTYLE FREEDOM LITE) w/Device KIT Use to check blood sugar twice a day, E11.65 1 each 1  . buPROPion (WELLBUTRIN XL) 300 MG 24 hr tablet TAKE 1 TABLET BY MOUTH DAILY. 90  tablet 3  . Calcium-Magnesium-Vitamin D (CALCIUM 500 PO) Take by mouth 2 (two) times daily.    . cholecalciferol (VITAMIN D) 1000 UNITS tablet Take 2,000 Units by mouth daily.     . Cyanocobalamin (B-12) 1000 MCG SUBL Place 1 tablet under the tongue daily.    . doxazosin (CARDURA) 2 MG tablet Take 1 tablet (2 mg total) by mouth daily. 30 tablet 6  . Esomeprazole Magnesium (NEXIUM PO) Take 22.5 mg by mouth daily.    . ezetimibe (ZETIA) 10 MG tablet Take 1 tablet (10 mg total) by mouth daily. 90   tablet 3  . glipiZIDE (GLUCOTROL XL) 2.5 MG 24 hr tablet Take 1 tablet (2.5 mg total) by mouth daily with supper. 90 tablet 3  . iron polysaccharides (NIFEREX) 150 MG capsule Take 150 mg by mouth daily.    . Lancets (FREESTYLE) lancets Use to check blood sugar twice a day, E11.65 200 each 12  . meloxicam (MOBIC) 15 MG tablet TAKE 1 TABLET BY MOUTH DAILY. 30 tablet 5  . metFORMIN (GLUCOPHAGE-XR) 500 MG 24 hr tablet TAKE 2 TABLETS BY MOUTH 2 TIMES DAILY WITH A MEAL. 360 tablet 0  . metoprolol succinate (TOPROL-XL) 25 MG 24 hr tablet TAKE 1 TABLET BY MOUTH DAILY. 90 tablet 0  . Multiple Vitamins-Minerals (HM MULTIVITAMIN ADULT GUMMY PO) Take 1 tablet by mouth daily.    . simvastatin (ZOCOR) 40 MG tablet Take 40 mg by mouth daily.     No current facility-administered medications on file prior to visit.    Allergies  Allergen Reactions  . Amlodipine     Swelling    . Gabapentin Other (See Comments)    confusion   Family History  Problem Relation Age of Onset  . Cancer Mother        breast cancer  . Cancer Father        plasma sarcoma  . Breast cancer Neg Hx    PE: BP (!) 144/80   Pulse 78   Ht 5' 1" (1.549 m)   Wt 185 lb (83.9 kg)   SpO2 95%   BMI 34.96 kg/m  Body mass index is 34.96 kg/m. Wt Readings from Last 3 Encounters:  05/24/18 185 lb (83.9 kg)  05/10/18 182 lb 11.2 oz (82.9 kg)  04/03/18 180 lb (81.6 kg)   Constitutional: overweight, in NAD Eyes: PERRLA, EOMI, no  exophthalmos ENT: moist mucous membranes, no thyromegaly, no cervical lymphadenopathy Cardiovascular: RRR, No MRG Respiratory: CTA B Gastrointestinal: abdomen soft, NT, ND, BS+ Musculoskeletal: no deformities, strength intact in all 4 Skin: moist, warm, no rashes Neurological: no tremor with outstretched hands, DTR normal in all 4  ASSESSMENT: 1. DM2, non-insulin-dependent, uncontrolled, with complications and hyperglycemia - mild CKD  2. Obesity  3. HL  PLAN:  1. Patient with long-standing, previously uncontrolled diabetes, with much improved control in the last several months,  initially on Victoza, but now off the medication due to nausea.  At last visit, she was still having low blood sugars in the 50s and low 60s so we decreased the dose of her glipizide to only take it before dinner.  At this visit, sugars are excellent, with only one lower value at 66 before dinner due to a long time between lunch and dinner.  We will not change her regimen at this visit, but at next visit, plan to stop glipizide completely. - I suggested to:  Patient Instructions  Please continue: - Metformin ER 1000 mg 2x a day - Glipizide ER 2.5 mg before dinner  Please return in 4 months with your sugar log.   - today, HbA1c is 6.2% (higher, but still great!) - continue checking sugars at different times of the day - check 1x a day, rotating checks - advised for yearly eye exams >> she is UTD - Return to clinic in 4 mo with sugar log    2. Obesity - she did not lose any weight since last visit >> weight up 3 lbs - I am hoping that with stopping glipizide at next visit, this would improve  3.  Hyperlipidemia -   Reviewed latest lipid panel from 04/2018: LDL at goal, triglycerides high, HDL slightly low Lab Results  Component Value Date   CHOL 168 04/12/2018   HDL 34 (L) 04/12/2018   LDLCALC 85 04/12/2018   LDLDIRECT 88.0 03/07/2017   TRIG 247 (H) 04/12/2018   CHOLHDL 4.9 04/12/2018  - She is not  on statins due to previous intolerance.  Sarted Zetia.  Cristina Gherghe, MD PhD [facility]   

## 2018-05-31 MED FILL — ALPRAZolam 1 MG TABS: 1 | 30 days supply | Qty: 60 | Fill #2

## 2018-06-08 ENCOUNTER — Other Ambulatory Visit: Payer: Self-pay | Admitting: Internal Medicine

## 2018-06-08 MED FILL — METFORMIN HCL ER 500 MG TAB: 500 | 90 days supply | Qty: 360 | Fill #0

## 2018-06-08 MED FILL — DOXAZOSIN MESYLATE 2 MG TAB: 2 | 30 days supply | Qty: 30 | Fill #2

## 2018-06-14 ENCOUNTER — Encounter: Payer: Self-pay | Admitting: Family Medicine

## 2018-06-14 NOTE — Progress Notes (Unsigned)
Pt states she isnt able to come in due to work. She stated she wants to know if a muscle relaxer can be sent for her to take at night.

## 2018-06-15 ENCOUNTER — Encounter: Payer: Self-pay | Admitting: Family Medicine

## 2018-06-21 ENCOUNTER — Ambulatory Visit: Payer: Medicare Other | Admitting: Family Medicine

## 2018-06-22 ENCOUNTER — Encounter: Payer: Self-pay | Admitting: Family Medicine

## 2018-07-05 ENCOUNTER — Other Ambulatory Visit: Payer: Self-pay | Admitting: Cardiovascular Disease

## 2018-07-05 ENCOUNTER — Encounter: Payer: Self-pay | Admitting: Gastroenterology

## 2018-07-05 ENCOUNTER — Ambulatory Visit (INDEPENDENT_AMBULATORY_CARE_PROVIDER_SITE_OTHER): Payer: Medicare Other | Admitting: Family Medicine

## 2018-07-05 ENCOUNTER — Encounter: Payer: Self-pay | Admitting: Family Medicine

## 2018-07-05 VITALS — BP 114/70 | HR 75 | Temp 98.4°F | Ht 61.0 in | Wt 182.8 lb

## 2018-07-05 DIAGNOSIS — N183 Chronic kidney disease, stage 3 unspecified: Secondary | ICD-10-CM | POA: Insufficient documentation

## 2018-07-05 DIAGNOSIS — I25118 Atherosclerotic heart disease of native coronary artery with other forms of angina pectoris: Secondary | ICD-10-CM | POA: Diagnosis not present

## 2018-07-05 DIAGNOSIS — D509 Iron deficiency anemia, unspecified: Secondary | ICD-10-CM | POA: Diagnosis not present

## 2018-07-05 DIAGNOSIS — C9190 Lymphoid leukemia, unspecified not having achieved remission: Secondary | ICD-10-CM

## 2018-07-05 DIAGNOSIS — R634 Abnormal weight loss: Secondary | ICD-10-CM

## 2018-07-05 DIAGNOSIS — C9110 Chronic lymphocytic leukemia of B-cell type not having achieved remission: Secondary | ICD-10-CM

## 2018-07-05 DIAGNOSIS — M5416 Radiculopathy, lumbar region: Secondary | ICD-10-CM

## 2018-07-05 DIAGNOSIS — G8929 Other chronic pain: Secondary | ICD-10-CM

## 2018-07-05 LAB — COMPREHENSIVE METABOLIC PANEL
ALK PHOS: 74 U/L (ref 39–117)
ALT: 13 U/L (ref 0–35)
AST: 13 U/L (ref 0–37)
Albumin: 4.4 g/dL (ref 3.5–5.2)
BILIRUBIN TOTAL: 0.5 mg/dL (ref 0.2–1.2)
BUN: 37 mg/dL — ABNORMAL HIGH (ref 6–23)
CALCIUM: 9.4 mg/dL (ref 8.4–10.5)
CO2: 24 mEq/L (ref 19–32)
Chloride: 108 mEq/L (ref 96–112)
Creatinine, Ser: 1.59 mg/dL — ABNORMAL HIGH (ref 0.40–1.20)
GFR: 34.16 mL/min — AB (ref >60.00)
GLUCOSE: 132 mg/dL — AB (ref 70–99)
POTASSIUM: 5.7 meq/L — AB (ref 3.5–5.1)
Sodium: 139 mEq/L (ref 135–145)
TOTAL PROTEIN: 6.1 g/dL (ref 6.0–8.3)

## 2018-07-05 LAB — CBC WITH DIFFERENTIAL/PLATELET
BASOS ABS: 0.1 10*3/uL (ref 0.0–0.1)
Basophils Relative: 0.9 % (ref 0.0–3.0)
EOS PCT: 3.5 % (ref 0.0–5.0)
Eosinophils Absolute: 0.3 10*3/uL (ref 0.0–0.7)
HEMATOCRIT: 37.3 % (ref 36.0–46.0)
HEMOGLOBIN: 12.2 g/dL (ref 12.0–15.0)
LYMPHS ABS: 3.3 10*3/uL (ref 0.7–4.0)
LYMPHS PCT: 40.4 % (ref 12.0–46.0)
MCHC: 32.7 g/dL (ref 30.0–36.0)
MCV: 85.6 fl (ref 78.0–100.0)
MONOS PCT: 5.3 % (ref 3.0–12.0)
Monocytes Absolute: 0.4 10*3/uL (ref 0.1–1.0)
NEUTROS PCT: 49.9 % (ref 43.0–77.0)
Neutro Abs: 4 10*3/uL (ref 1.4–7.7)
Platelets: 165 10*3/uL (ref 150.0–400.0)
RBC: 4.36 Mil/uL (ref 3.87–5.11)
RDW: 14 % (ref 11.5–15.5)
WBC: 8.1 10*3/uL (ref 4.0–10.5)

## 2018-07-05 LAB — FERRITIN: Ferritin: 31.5 ng/mL (ref 10.0–291.0)

## 2018-07-05 MED ORDER — GABAPENTIN 300 MG PO CAPS: 300.0000 mg | ORAL_CAPSULE | Freq: Every day | ORAL | 3 refills | Status: DC

## 2018-07-05 MED FILL — GABAPENTIN 300 MG CAPSULE: 300 | 90 days supply | Qty: 90 | Fill #0

## 2018-07-05 MED FILL — ALPRAZolam 1 MG TABS: 1 | 30 days supply | Qty: 60 | Fill #3

## 2018-07-05 MED FILL — SIMVASTATIN 40 MG TABLET: 40 | 90 days supply | Qty: 90 | Fill #0

## 2018-07-05 NOTE — Assessment & Plan Note (Signed)
Acute on chronic. Likely multifactorial. BUN was elevated in July along with cr which does indicate a prerenal component.  Since that time, she has been drinking a minimum of 64 ounces of water per day. Repeat CMET today. Also STOPPING scheduled NSAIDs. Advised talking with Dr. Cruzita Lederer about alternatives to Metformin.  She will discuss this with her and I will route her my note as well.

## 2018-07-05 NOTE — Assessment & Plan Note (Signed)
Deteriorated despite IV and oral iron. Agree with GI referral for possible endoscopy and colonoscopy to rule out GIB- referral placed. She will also stop taking NSAIDs.

## 2018-07-05 NOTE — Assessment & Plan Note (Signed)
Followed by hematology.  Not currently on rx for this.

## 2018-07-05 NOTE — Patient Instructions (Addendum)
Great to see you. I will call you with your lab results from today and you can view them online.   We are restarting gabapentin 300 mg nightly. Please continue Tylenol two ES twice daily. STOP taking schedules NSAIDS (ibuprofen, mobic). If you need something for breakthrough pain, let's try taking 1 alleve with food as needed (up to twice daily).  Please talk to Dr. Cruzita Lederer about stopping Metformin since it can worsen kidney function.  We are also referring you to GI.  Someone will call you with this appointment.

## 2018-07-05 NOTE — Progress Notes (Signed)
Subjective:   Patient ID: Brenda [name], female    DOB: January 21, 1951, 67 y.o.   MRN: [id number]  Brenda [name] is a pleasant 67 y.o. year old female who presents to clinic today with Follow-up (Patient is here today to F/U after seeing Hematologist.  He wants her to have a full GI work-up before shee sees him in January and a renal function lab done in October.  )  on 07/05/2018  HPI: H/o CLL and microcytic anemia, followed by hematology, Dr. Pennie Banter. Last seen by Dr. Pennie Banter on 05/10/18.  Notes, labs, studies reviewed.  Lab work from that day showed her Cr increased from 1.23 to 1.79 with a BUN of 45. Hemoglobin of 11.4 and lymph abs at 3.5 K.  He advised her to drink more water as BUN was elevated along with elevation of Cr (indicative of a component of pre renal insufficiency). Advised that I follow this up with her. She has been drinking 64 ounces of water with her to work every day.  Microcytic anemia-  H/H continues to drop despite oral and IV iron. Hgb was holding steady s/p 2U PRBC - Fe infusioncompleted 1/19- stool hemoccultnegative- he suspect this may be due to CKD, but iron deficiency also worrisome for occult GIB. He therefore recommended that I refer her for IG work up as she is also taking ibuprofen and mobic- concern for GIB. Last colonoscopy was done on 11/25/09- reviewed, done by Dr. Deatra Ina- negative. She is having more loose and frequent stools since last summer.  They are dark but she is taking iron so she was not concerned.  She is not currently receiving any treatment for her CLL.  Still having back pain with pain down the sides of her legs. Has been taking two tylenol and two ibuprofen twice daily for this pain. Could not tolerate Gabapentin- caused foggy headedness once she increased dose from 300 mg once daily to 300 mg three times daily.  She never tried to wean down to daily dose which she tolerated well.  Wt Readings from Last 3 Encounters:  07/05/18 182 lb  12.8 oz (82.9 kg)  05/24/18 185 lb (83.9 kg)  05/10/18 182 lb 11.2 oz (82.9 kg)     Current Outpatient Medications on File Prior to Visit  Medication Sig Dispense Refill  . ALPRAZolam (XANAX) 1 MG tablet 1/2- 1 tablet twice daily as needed (Patient taking differently: 1/2- 1 tablet twice daily as needed) 60 tablet 3  . aspirin 81 MG tablet Take 81 mg by mouth daily.    . benazepril-hydrochlorthiazide (LOTENSIN HCT) 20-12.5 MG tablet Take 1 tablet by mouth daily.  3  . Blood Glucose Monitoring Suppl (FREESTYLE FREEDOM LITE) w/Device KIT Use to check blood sugar twice a day, E11.65 1 each 1  . buPROPion (WELLBUTRIN XL) 300 MG 24 hr tablet TAKE 1 TABLET BY MOUTH DAILY. 90 tablet 3  . Calcium-Magnesium-Vitamin D (CALCIUM 500 PO) Take by mouth 2 (two) times daily.    . cholecalciferol (VITAMIN D) 1000 UNITS tablet Take 2,000 Units by mouth daily.     . Cyanocobalamin (B-12) 1000 MCG SUBL Place 1 tablet under the tongue daily.    Marland Kitchen doxazosin (CARDURA) 2 MG tablet Take 1 tablet (2 mg total) by mouth daily. 30 tablet 6  . Esomeprazole Magnesium (NEXIUM PO) Take 22.5 mg by mouth daily.    Marland Kitchen ezetimibe (ZETIA) 10 MG tablet Take 1 tablet (10 mg total) by mouth daily. 90 tablet 3  .  glipiZIDE (GLUCOTROL XL) 2.5 MG 24 hr tablet Take 1 tablet (2.5 mg total) by mouth daily with supper. 90 tablet 3  . iron polysaccharides (NIFEREX) 150 MG capsule Take 150 mg by mouth daily.    . Lancets (FREESTYLE) lancets Use to check blood sugar twice a day, E11.65 200 each 12  . meloxicam (MOBIC) 15 MG tablet TAKE 1 TABLET BY MOUTH DAILY. 30 tablet 5  . metFORMIN (GLUCOPHAGE-XR) 500 MG 24 hr tablet TAKE 2 TABLETS BY MOUTH 2 TIMES DAILY WITH A MEAL. 360 tablet 0  . metoprolol succinate (TOPROL-XL) 25 MG 24 hr tablet TAKE 1 TABLET BY MOUTH DAILY. 90 tablet 0  . Multiple Vitamins-Minerals (HM MULTIVITAMIN ADULT GUMMY PO) Take 1 tablet by mouth daily.    . simvastatin (ZOCOR) 40 MG tablet Take 40 mg by mouth daily.     No  current facility-administered medications on file prior to visit.     Allergies  Allergen Reactions  . Amlodipine     Swelling    . Gabapentin Other (See Comments)    confusion    Past Medical History:  Diagnosis Date  . Anxiety   . Arthritis   . Depression    Wellbutrin  . Diabetes mellitus    Type II  . Endometrial ca ([place])    1998  . 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 GERD (gastroesophageal reflux disease)   . Heart murmur    patient states "cardiologist has not heard heart murmur for past several years"  . Heel spur   . Hyperlipidemia   . Hypertension   . Left leg swelling    "swelling in left leg from knee down when sitting for prolonged periods or being on feet for a long period of time"  . PONV (postoperative nausea and vomiting)    after hysterectomy  . Renal insufficiency     Past Surgical History:  Procedure Laterality Date  . ABDOMINAL HYSTERECTOMY  1998  . COLONOSCOPY    . EYE SURGERY Bilateral    cataracts  . EYE SURGERY Left    retina tear  . FEMUR IM NAIL  10/25/2012   Procedure: INTRAMEDULLARY (IM) NAIL FEMORAL;  Surgeon: Mauri Pole, MD;  Location: [facility];  Service: Orthopedics;  Laterality: Left;  . HIP SURGERY    . left knee arthroscopy  12/2010  . REFRACTIVE SURGERY    . TOTAL KNEE ARTHROPLASTY  12/20/2011   Procedure: TOTAL KNEE ARTHROPLASTY;  Surgeon: Gearlean Alf, MD;  Location: [facility];  Service: Orthopedics;  Laterality: Left;  Failed attempt at spinal   . TOTAL SHOULDER ARTHROPLASTY Right 08/19/2016   Procedure: RIGHT TOTAL SHOULDER ARTHROPLASTY;  Surgeon: Justice Britain, MD;  Location: [facility];  Service: Orthopedics;  Laterality: Right;    Family History  Problem Relation Age of Onset  . Cancer Mother        breast cancer  . Cancer Father        plasma sarcoma  . Breast cancer Neg Hx     Social History   Socioeconomic History  . Marital status: Single    Spouse name: Not on file    . Number of children: Not on file  . Years of education: Not on file  . Highest education level: Not on file  Occupational History  . Occupation: Therapist, sports at [employer]: [employer]  Social Needs  . Financial resource strain:  Not on file  . Food insecurity:    Worry: Not on file    Inability: Not on file  . Transportation needs:    Medical: Not on file    Non-medical: Not on file  Tobacco Use  . Smoking status: Never Smoker  . Smokeless tobacco: Never Used  Substance and Sexual Activity  . Alcohol use: No  . Drug use: No  . Sexual activity: Not on file  Lifestyle  . Physical activity:    Days per week: Not on file    Minutes per session: Not on file  . Stress: Not on file  Relationships  . Social connections:    Talks on phone: Not on file    Gets together: Not on file    Attends religious service: Not on file    Active member of club or organization: Not on file    Attends meetings of clubs or organizations: Not on file    Relationship status: Not on file  . Intimate partner violence:    Fear of current or ex partner: Not on file    Emotionally abused: Not on file    Physically abused: Not on file    Forced sexual activity: Not on file  Other Topics Concern  . 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     Review of Systems  Constitutional: Positive for fatigue.  Eyes: Negative.   Respiratory: Negative.   Gastrointestinal: Positive for diarrhea. Negative for abdominal distention, abdominal pain, anal bleeding, blood in stool, constipation, nausea and rectal pain.  Genitourinary: Negative.   Musculoskeletal: Positive for arthralgias, back pain and joint swelling.  Skin: Negative.   Neurological: Positive for numbness.  Hematological: Negative.   Psychiatric/Behavioral: Negative.    All other systems reviewed and are negative.      Objective:    BP 114/70 (BP Location: Left Arm, Patient Position: Sitting, Cuff Size: Normal)   Pulse 75   Temp 98.4 F (36.9 C) (Oral)   Ht _0  (1.549 m)   Wt 182 lb 12.8 oz (82.9 kg)   SpO2 98%   BMI 34.54 kg/m    Physical Exam  Constitutional: She is oriented to person, place, and time. She appears well-developed and well-nourished. No distress.  HENT:  Head: Normocephalic and atraumatic.  Eyes: EOM are normal.  Neck: Normal range of motion.  Cardiovascular: Normal rate.  Pulmonary/Chest: Effort normal.  Musculoskeletal: Normal range of motion.  Neurological: She is alert and oriented to person, place, and time. No cranial nerve deficit.  Skin: Skin is warm and dry. She is not diaphoretic.  Psychiatric: She has a normal mood and affect. Her behavior is normal. Judgment and thought content normal.  Nursing note and vitals reviewed.         Assessment & Plan:   Microcytic anemia - Plan: Comprehensive metabolic panel, CBC with Differential/Platelet, Ferritin, Ambulatory referral to Gastroenterology  Unintentional weight loss  CLL (chronic lymphocytic leukemia) (HCC) - Plan: CBC with Differential/Platelet  CKD (chronic kidney disease) stage 3, GFR 30-59 ml/min (HCC) - Plan: Comprehensive metabolic panel, CBC with Differential/Platelet No follow-ups on file.

## 2018-07-05 NOTE — Assessment & Plan Note (Signed)
Deteriorated. We discussed the importance of STOPPING all NSAIDs for now due to both concerns- renal disease and anemia. We discussed different tx options. She would like to try gabapentin 300 mg nightly which she has tolerated well in the past rather than lyrica or other options. eRx sent. Call or return to clinic prn if these symptoms worsen or fail to improve as anticipated. The patient indicates understanding of these issues and agrees with the plan.

## 2018-07-06 ENCOUNTER — Other Ambulatory Visit (INDEPENDENT_AMBULATORY_CARE_PROVIDER_SITE_OTHER): Payer: Medicare Other

## 2018-07-06 ENCOUNTER — Other Ambulatory Visit: Payer: Self-pay

## 2018-07-06 DIAGNOSIS — E875 Hyperkalemia: Secondary | ICD-10-CM | POA: Diagnosis not present

## 2018-07-06 LAB — COMPREHENSIVE METABOLIC PANEL
AG Ratio: 2.3 (calc) (ref 1.0–2.5)
ALBUMIN MSPROF: 4.4 g/dL (ref 3.6–5.1)
ALKALINE PHOSPHATASE (APISO): 94 U/L (ref 33–130)
ALT: 15 U/L (ref 6–29)
AST: 14 U/L (ref 10–35)
BUN/Creatinine Ratio: 21 (calc) (ref 6–22)
BUN: 39 mg/dL — ABNORMAL HIGH (ref 7–25)
CO2: 21 mmol/L (ref 20–32)
CREATININE: 1.86 mg/dL — AB (ref 0.50–0.99)
Calcium: 9.3 mg/dL (ref 8.6–10.4)
Chloride: 108 mmol/L (ref 98–110)
Globulin: 1.9 g/dL (calc) (ref 1.9–3.7)
Glucose, Bld: 121 mg/dL — ABNORMAL HIGH (ref 65–99)
POTASSIUM: 4.8 mmol/L (ref 3.5–5.3)
Sodium: 139 mmol/L (ref 135–146)
Total Bilirubin: 0.3 mg/dL (ref 0.2–1.2)
Total Protein: 6.3 g/dL (ref 6.1–8.1)

## 2018-07-06 NOTE — Progress Notes (Signed)
Pt scheduled at [facility] for lab visit STAT CMP for Hyperkalemia today at 4pm per TA/thx dmf

## 2018-07-11 MED FILL — DOXAZOSIN MESYLATE 2 MG TAB: 2 | 30 days supply | Qty: 30 | Fill #3

## 2018-07-11 MED FILL — BENAZEPRIL-HYDROCHLOROTHIAZ: 20-12.5 | 90 days supply | Qty: 90 | Fill #1

## 2018-07-21 ENCOUNTER — Other Ambulatory Visit: Payer: Self-pay | Admitting: Internal Medicine

## 2018-07-24 MED FILL — EZETIMIBE 10 MG TABLET: 10 | 90 days supply | Qty: 90 | Fill #1

## 2018-07-26 ENCOUNTER — Telehealth: Payer: Self-pay

## 2018-07-26 NOTE — Telephone Encounter (Signed)
Paperwork  for DM order received and placed on Dr. Cruzita Lederer desk.  Phone note created.

## 2018-08-01 MED FILL — BuPROPion HCL ER (XL) 300 M: 300 | 90 days supply | Qty: 90 | Fill #1

## 2018-08-08 ENCOUNTER — Other Ambulatory Visit: Payer: Self-pay | Admitting: Family Medicine

## 2018-08-08 MED FILL — DOXAZOSIN MESYLATE 2 MG TAB: 2 | 30 days supply | Qty: 30 | Fill #4

## 2018-08-08 MED FILL — ALPRAZolam 1 MG TABS: 1 | 30 days supply | Qty: 60 | Fill #0

## 2018-08-08 NOTE — Telephone Encounter (Signed)
TA-LOV 9.4.19/NOV 5.20.20/Per [place] PMP no red flags/plz advise/thx dmf

## 2018-08-10 ENCOUNTER — Other Ambulatory Visit: Payer: Self-pay | Admitting: Internal Medicine

## 2018-08-10 ENCOUNTER — Other Ambulatory Visit: Payer: Self-pay | Admitting: Cardiovascular Disease

## 2018-08-11 ENCOUNTER — Other Ambulatory Visit: Payer: Self-pay | Admitting: Family Medicine

## 2018-08-11 DIAGNOSIS — Z1231 Encounter for screening mammogram for malignant neoplasm of breast: Secondary | ICD-10-CM

## 2018-08-11 MED FILL — glipiZIDE ER 2.5 MG TB24: 2.5 | 90 days supply | Qty: 90 | Fill #0

## 2018-08-11 MED FILL — METOPROLOL SUCCINATE ER 25: 25 | 90 days supply | Qty: 90 | Fill #0

## 2018-08-14 ENCOUNTER — Encounter: Payer: Self-pay | Admitting: Internal Medicine

## 2018-08-14 ENCOUNTER — Encounter: Payer: Self-pay | Admitting: Family Medicine

## 2018-08-15 ENCOUNTER — Telehealth: Payer: Self-pay

## 2018-08-15 ENCOUNTER — Encounter: Payer: Self-pay | Admitting: Family Medicine

## 2018-08-15 MED ORDER — GLIPIZIDE ER 2.5 MG PO TB24: ORAL_TABLET | ORAL | 1 refills | Status: DC

## 2018-08-15 NOTE — Telephone Encounter (Signed)
Received note from pharmacy regarding patient glipizide, last chart note states 2.5 mg before dinner, patient states to pharmacy she should be taking it BID.  Please advise.

## 2018-08-15 NOTE — Telephone Encounter (Signed)
Per my last msg to her:  Dear Ms. Weill,  Yes, that is ok, take the glipizide 15 to 30 minutes before each of the 2 meals.  Sincerely,  Philemon Kingdom MD    Last read by Vertell Limber at 12:06 PM on 08/14/2018.   Let's send the Rx with 2x a day.

## 2018-08-15 NOTE — Telephone Encounter (Signed)
New RX sent

## 2018-08-16 ENCOUNTER — Encounter: Payer: Self-pay | Admitting: Family Medicine

## 2018-08-16 DIAGNOSIS — E119 Type 2 diabetes mellitus without complications: Secondary | ICD-10-CM | POA: Diagnosis not present

## 2018-08-16 DIAGNOSIS — H33012 Retinal detachment with single break, left eye: Secondary | ICD-10-CM | POA: Diagnosis not present

## 2018-08-16 LAB — HM DIABETES EYE EXAM

## 2018-08-16 NOTE — Progress Notes (Signed)
Referring Provider: Lucille Passy, MD Primary Care Physician:  Lucille Passy, MD   Reason for Consultation:  Microcytic anemia   IMPRESSION:  Microcytic anemia not responding to oral or IV iron    - hemoccult negative Dysphagia - primarily to pills Change in bowel habits x 14 months Cholelithiasis on CT 05/01/2018 CLL with associated lymphadenopathy BMI 36  Etiology of anemia is unclear.  She reports intermittent pill dysphagia and a 14 month history of altered bowel habits. EGD and colonoscopy recommended, including duodenal biopsies for celiac sprue. The patient requested esophageal dilation if there were any abnormalities seen on endoscopy to explain her dysphagia.  PLAN: EGD with duodenal biopsies +/- balloon dilation if clinically appropriate Colonoscopy  I consented the patient at the bedside today discussing the risks, benefits, and alternatives to endoscopic evaluation. In particular, we discussed the risks that include, but are not limited to, reaction to medication, cardiopulmonary compromise, bleeding requiring blood transfusion, aspiration resulting in pneumonia, perforation requiring surgery, and even death. We reviewed the risk of missed lesion including polyps or even cancer. The patient acknowledges these risks and asks that we proceed.    HPI: Brenda [name] is a 67 y.o. female seen in consultation at the request of Dr. Deborra Medina for further evaluation of microcytic anemia. 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 The history is obtained through the patient and review of her electronic medica record. She is an excellent historian. She has chronic kidney disease, CLL (not currently receiving any treatment) and microscopic anemia. She has been followed by Dr. Pennie Banter and hematology. Anemia has been present for 18 months and is not fully responding to IV and po iron replacement. She is currently taking polyiron 150. Dr. Pennie Banter recommended  an evaluation for occult GI bleeding.  She has previously required 2 units PRBCs for a hemoglobin nadir of 6.6. Hemoccult negative. Pernicious anemia work-up was negative. She was instructed to stop using NSAIDs.   She report a change in her bowel habits over the last 14 months.  Normal BM first in the morning, although they are dark green on iron. Loose stool 30 minutes later. Some days she will have multiple, very small bowel movements as the day progresses. Some blood on the TP by the end of the day after multiple bowel movements. Occassional mucous at the end of the day. Occassional uses Lomotil - 2x week at most - to control her symptoms.  She wonders if this change in bowel habits resulted in her initial dehydration and renal dysfunction. No other associated symptoms. No identified exacerbating or relieving features.   The patient had a screening colonoscopy with Dr. Erskine Emery 11/25/2009 that was normal.  Routine colon cancer screening was recommended. No prior upper endoscopy. She has been off al NSAIDs. Uses Tylenol for hip pain. Rare Aleve with breakthrough pain once weekly.   Longstanding history of reflux. Now only needing Nexium PRN. Now not more than 2-3 times each month. Difficulty swallowing where she may to wait for something to pass through the esophagus. Primarily occurs with pills. No specific foods cause the difficulty.  Father with esophageal food impactions x 2.   She has a history of lymphadenopathy. CT abd/pelvis from 05/01/18 showed mildly progressive lymphadenopathy in the chest, abdomen, and pelvis corresponding to the patient's known CLL. Cholelithiasis present.   Past Medical History:  Diagnosis Date  . Anxiety   . Arthritis   .  CLL (chronic lymphocytic leukemia) ([place])   . Depression    Wellbutrin  . Diabetes mellitus    Type II  . Endometrial ca ([place])    1998  . 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 GERD (gastroesophageal  reflux disease)   . Heart murmur    patient states "cardiologist has not heard heart murmur for past several years"  . Heel spur   . Hyperlipidemia   . Hypertension   . Left leg swelling    "swelling in left leg from knee down when sitting for prolonged periods or being on feet for a long period of time"  . PONV (postoperative nausea and vomiting)    after hysterectomy  . Renal insufficiency     Past Surgical History:  Procedure Laterality Date  . ABDOMINAL HYSTERECTOMY  1998  . COLONOSCOPY    . EYE SURGERY Bilateral    cataracts  . EYE SURGERY Left    retina tear  . FEMUR IM NAIL  10/25/2012   Procedure: INTRAMEDULLARY (IM) NAIL FEMORAL;  Surgeon: Mauri Pole, MD;  Location: [facility];  Service: Orthopedics;  Laterality: Left;  . HIP SURGERY    . left knee arthroscopy  12/2010  . REFRACTIVE SURGERY    . TOTAL KNEE ARTHROPLASTY  12/20/2011   Procedure: TOTAL KNEE ARTHROPLASTY;  Surgeon: Gearlean Alf, MD;  Location: [facility];  Service: Orthopedics;  Laterality: Left;  Failed attempt at spinal   . TOTAL SHOULDER ARTHROPLASTY Right 08/19/2016   Procedure: RIGHT TOTAL SHOULDER ARTHROPLASTY;  Surgeon: Justice Britain, MD;  Location: [facility];  Service: Orthopedics;  Laterality: Right;    Prior to Admission medications   Medication Sig Start Date End Date Taking? Authorizing Provider  ALPRAZolam Duanne Moron) 1 MG tablet TAKE 1/2-1 TABLET BY MOUTH 2 TIMES A DAY AS NEEDED 08/08/18   Lucille Passy, MD  aspirin 81 MG tablet Take 81 mg by mouth daily.    [provider]  benazepril-hydrochlorthiazide (LOTENSIN HCT) 20-12.5 MG tablet Take 1 tablet by mouth daily. 03/14/18   [provider]  Blood Glucose Monitoring Suppl (FREESTYLE FREEDOM LITE) w/Device KIT Use to check blood sugar twice a day, E11.65 10/20/16   Philemon Kingdom, MD  buPROPion (WELLBUTRIN XL) 300 MG 24 hr tablet TAKE 1 TABLET BY MOUTH DAILY. 05/02/18   Lucille Passy, MD  Calcium-Magnesium-Vitamin D (CALCIUM 500 PO)  Take by mouth 2 (two) times daily.    [provider]  cholecalciferol (VITAMIN D) 1000 UNITS tablet Take 2,000 Units by mouth daily.     [provider]  Cyanocobalamin (B-12) 1000 MCG SUBL Place 1 tablet under the tongue daily. 12/15/17   [provider]  doxazosin (CARDURA) 2 MG tablet Take 1 tablet (2 mg total) by mouth daily. 04/03/18   Minna Merritts, MD  Esomeprazole Magnesium (NEXIUM PO) Take 22.5 mg by mouth daily.    [provider]  ezetimibe (ZETIA) 10 MG tablet Take 1 tablet (10 mg total) by mouth daily. 04/17/18 07/16/18  Minna Merritts, MD  gabapentin (NEURONTIN) 300 MG capsule Take 1 capsule (300 mg total) by mouth at bedtime. 07/05/18   Lucille Passy, MD  glipiZIDE (GLUCOTROL XL) 2.5 MG 24 hr tablet Take 2.5 MG twice a day with meal. 08/15/18   Philemon Kingdom, MD  glucose blood (FREESTYLE LITE) test strip TEST BLOOD SUGAR TWICE DAILY 07/21/18   Philemon Kingdom, MD  iron polysaccharides (NIFEREX) 150  MG capsule Take 150 mg by mouth daily.    [provider]  Lancets (FREESTYLE) lancets Use to check blood sugar twice a day, E11.65 05/13/17   Philemon Kingdom, MD  metFORMIN (GLUCOPHAGE-XR) 500 MG 24 hr tablet TAKE 2 TABLETS BY MOUTH 2 TIMES DAILY WITH A MEAL. 06/08/18   Philemon Kingdom, MD  metoprolol succinate (TOPROL-XL) 25 MG 24 hr tablet TAKE 1 TABLET BY MOUTH DAILY. 08/11/18   Minna Merritts, MD  Multiple Vitamins-Minerals (HM MULTIVITAMIN ADULT GUMMY PO) Take 1 tablet by mouth daily.    [provider]  simvastatin (ZOCOR) 40 MG tablet Take 40 mg by mouth daily.    [provider]    Current Outpatient Medications  Medication Sig Dispense Refill  . ALPRAZolam (XANAX) 1 MG tablet TAKE 1/2-1 TABLET BY MOUTH 2 TIMES A DAY AS NEEDED 60 tablet 5  . aspirin 81 MG tablet Take 81 mg by mouth daily.    . benazepril-hydrochlorthiazide (LOTENSIN HCT) 20-12.5 MG tablet Take 1 tablet by mouth daily.  3  . Blood Glucose  Monitoring Suppl (FREESTYLE FREEDOM LITE) w/Device KIT Use to check blood sugar twice a day, E11.65 1 each 1  . buPROPion (WELLBUTRIN XL) 300 MG 24 hr tablet TAKE 1 TABLET BY MOUTH DAILY. 90 tablet 3  . Cholecalciferol (VITAMIN D3) 2000 units TABS Take 2,000 mcg by mouth daily.    . Cyanocobalamin (B-12) 1000 MCG SUBL Place 1 tablet under the tongue daily.    Marland Kitchen doxazosin (CARDURA) 2 MG tablet Take 1 tablet (2 mg total) by mouth daily. 30 tablet 6  . Esomeprazole Magnesium (NEXIUM PO) Take 22.5 mg by mouth daily.    Marland Kitchen gabapentin (NEURONTIN) 300 MG capsule Take 1 capsule (300 mg total) by mouth at bedtime. 90 capsule 3  . glipiZIDE (GLUCOTROL XL) 2.5 MG 24 hr tablet Take 2.5 MG twice a day with meal. 180 tablet 1  . glucose blood (FREESTYLE LITE) test strip TEST BLOOD SUGAR TWICE DAILY 200 each 0  . iron polysaccharides (NIFEREX) 150 MG capsule Take 150 mg by mouth daily.    . Lancets (FREESTYLE) lancets Use to check blood sugar twice a day, E11.65 200 each 12  . metFORMIN (GLUCOPHAGE-XR) 500 MG 24 hr tablet TAKE 2 TABLETS BY MOUTH 2 TIMES DAILY WITH A MEAL. 360 tablet 0  . metoprolol succinate (TOPROL-XL) 25 MG 24 hr tablet TAKE 1 TABLET BY MOUTH DAILY. 90 tablet 2  . Multiple Vitamins-Minerals (HM MULTIVITAMIN ADULT GUMMY PO) Take 1 tablet by mouth daily.    . simvastatin (ZOCOR) 40 MG tablet Take 40 mg by mouth daily.    Marland Kitchen ezetimibe (ZETIA) 10 MG tablet Take 1 tablet (10 mg total) by mouth daily. 90 tablet 3   No current facility-administered medications for this visit.     Allergies as of 08/17/2018 - Review Complete 08/17/2018  Allergen Reaction Noted  . Amlodipine  04/03/2018    Family History  Problem Relation Age of Onset  . Cancer Mother        breast cancer  . Cancer Father        plasma sarcoma  . Breast cancer Neg Hx     Social History   Socioeconomic History  . Marital status: Single    Spouse name: Not on file  . Number of children: Not on file  . Years of  education: Not on file  . Highest education level: Not on file  Occupational History  . 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 Financial resource strain: Not on file  . Food insecurity:    Worry: Not on file    Inability: Not on file  . Transportation needs:    Medical: Not on file    Non-medical: Not on file  Tobacco Use  . Smoking status: Never Smoker  . Smokeless tobacco: Never Used  Substance and Sexual Activity  . Alcohol use: No  . Drug use: No  . Sexual activity: Not on file  Lifestyle  . Physical activity:    Days per week: Not on file    Minutes per session: Not on file  . Stress: Not on file  Relationships  . Social connections:    Talks on phone: Not on file    Gets together: Not on file    Attends religious service: Not on file    Active member of club or organization: Not on file    Attends meetings of clubs or organizations: Not on file    Relationship status: Not on file  . Intimate partner violence:    Fear of current or ex partner: Not on file    Emotionally abused: Not on file    Physically abused: Not on file    Forced sexual activity: Not on file  Other Topics Concern  . 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   Physical Exam:  Vital signs were reviewed. 15-/84, 84, 190 pounds, BMI 35.95 General:   Alert, well-nourished, pleasant and cooperative in NAD Head:  Normocephalic and atraumatic. Eyes:  Sclera clear, no icterus.   Conjunctiva pink. Mouth:  No deformity or lesions.   Neck:  Supple; no thyromegaly. Lungs:  Clear throughout to auscultation.   No wheezes.  Heart:  Regular rate and rhythm; no murmurs Abdomen:  Soft, nontender, normal bowel sounds. No rebound or guarding. No hepatosplenomegaly Rectal:   Deferred  Msk:  Symmetrical without gross deformities. Extremities:  No gross deformities or edema. Neurologic:  Alert and  oriented x4;  grossly nonfocal Skin:  No rash or bruise. Psych:  Alert and cooperative. Normal mood and affect.    Jovana Rembold L. Tarri Glenn Md, MPH [facility] 08/17/2018, 2:32 PM

## 2018-08-17 ENCOUNTER — Ambulatory Visit (INDEPENDENT_AMBULATORY_CARE_PROVIDER_SITE_OTHER): Payer: Medicare Other | Admitting: Gastroenterology

## 2018-08-17 ENCOUNTER — Other Ambulatory Visit: Payer: Medicare Other

## 2018-08-17 ENCOUNTER — Encounter: Payer: Self-pay | Admitting: Gastroenterology

## 2018-08-17 VITALS — BP 150/84 | HR 84 | Ht 61.0 in | Wt 190.2 lb

## 2018-08-17 DIAGNOSIS — D509 Iron deficiency anemia, unspecified: Secondary | ICD-10-CM | POA: Diagnosis not present

## 2018-08-17 DIAGNOSIS — R194 Change in bowel habit: Secondary | ICD-10-CM

## 2018-08-17 DIAGNOSIS — R1310 Dysphagia, unspecified: Secondary | ICD-10-CM | POA: Diagnosis not present

## 2018-08-17 NOTE — Patient Instructions (Addendum)
Tips for colonoscopy:  -Stay well hydrated for 3-4 days before your exam.  This reduces nausea and dehydration.  - To prevent skin/hemorrhoid irritation - prior to wiping, put A&Dointment or vaseline on the toilet paper. -Keep a towel or pad on the bed.  -Drink 64oz of clear liquids in the morning of prep day (prior to starting the prep) to be sure that there is enough fluid to flush the colon and stay hydrated!!!! This is in addition to the fluids required for preparation.  You have been scheduled for an endoscopy and colonoscopy. Please follow the written instructions given to you at your visit today. Please pick up your prep supplies at the pharmacy within the next 1-3 days. If you use inhalers (even only as needed), please bring them with you on the day of your procedure. Your physician has requested that you go to www.startemmi.com and enter the access code given to you at your visit today. This web site gives a general overview about your procedure. However, you should still follow specific instructions given to you by our office regarding your preparation for the procedure.

## 2018-08-18 ENCOUNTER — Other Ambulatory Visit: Payer: Self-pay

## 2018-08-18 ENCOUNTER — Other Ambulatory Visit (INDEPENDENT_AMBULATORY_CARE_PROVIDER_SITE_OTHER): Payer: Medicare Other

## 2018-08-18 DIAGNOSIS — I10 Essential (primary) hypertension: Secondary | ICD-10-CM | POA: Diagnosis not present

## 2018-08-18 DIAGNOSIS — D509 Iron deficiency anemia, unspecified: Secondary | ICD-10-CM | POA: Diagnosis not present

## 2018-08-18 LAB — COMPREHENSIVE METABOLIC PANEL
AG Ratio: 2.5 (calc) (ref 1.0–2.5)
ALT: 12 U/L (ref 6–29)
AST: 13 U/L (ref 10–35)
Albumin: 4.2 g/dL (ref 3.6–5.1)
Alkaline phosphatase (APISO): 87 U/L (ref 33–130)
BUN/Creatinine Ratio: 23 (calc) — ABNORMAL HIGH (ref 6–22)
BUN: 30 mg/dL — ABNORMAL HIGH (ref 7–25)
CHLORIDE: 106 mmol/L (ref 98–110)
CO2: 24 mmol/L (ref 20–32)
CREATININE: 1.3 mg/dL — AB (ref 0.50–0.99)
Calcium: 9.3 mg/dL (ref 8.6–10.4)
GLOBULIN: 1.7 g/dL — AB (ref 1.9–3.7)
Glucose, Bld: 104 mg/dL — ABNORMAL HIGH (ref 65–99)
Potassium: 4.7 mmol/L (ref 3.5–5.3)
Sodium: 140 mmol/L (ref 135–146)
Total Bilirubin: 0.6 mg/dL (ref 0.2–1.2)
Total Protein: 5.9 g/dL — ABNORMAL LOW (ref 6.1–8.1)

## 2018-08-18 NOTE — Addendum Note (Signed)
Addended by: Lynnea Ferrier on: 08/18/2018 02:47 PM   Modules accepted: Orders

## 2018-08-31 ENCOUNTER — Ambulatory Visit (AMBULATORY_SURGERY_CENTER): Payer: Medicare Other | Admitting: Gastroenterology

## 2018-08-31 ENCOUNTER — Encounter: Payer: Self-pay | Admitting: Gastroenterology

## 2018-08-31 VITALS — BP 99/50 | HR 79 | Temp 97.3°F | Resp 12 | Ht 61.0 in | Wt 190.0 lb

## 2018-08-31 DIAGNOSIS — K3189 Other diseases of stomach and duodenum: Secondary | ICD-10-CM

## 2018-08-31 DIAGNOSIS — I2510 Atherosclerotic heart disease of native coronary artery without angina pectoris: Secondary | ICD-10-CM | POA: Diagnosis not present

## 2018-08-31 DIAGNOSIS — K766 Portal hypertension: Secondary | ICD-10-CM | POA: Diagnosis not present

## 2018-08-31 DIAGNOSIS — R1310 Dysphagia, unspecified: Secondary | ICD-10-CM

## 2018-08-31 DIAGNOSIS — E119 Type 2 diabetes mellitus without complications: Secondary | ICD-10-CM | POA: Diagnosis not present

## 2018-08-31 DIAGNOSIS — D509 Iron deficiency anemia, unspecified: Secondary | ICD-10-CM

## 2018-08-31 DIAGNOSIS — K219 Gastro-esophageal reflux disease without esophagitis: Secondary | ICD-10-CM

## 2018-08-31 DIAGNOSIS — I10 Essential (primary) hypertension: Secondary | ICD-10-CM | POA: Diagnosis not present

## 2018-08-31 MED ORDER — SODIUM CHLORIDE 0.9 % IV SOLN: 500.0000 mL | Freq: Once | INTRAVENOUS | Status: DC

## 2018-08-31 NOTE — Progress Notes (Signed)
Called to room to assist during endoscopic procedure.  Patient ID and intended procedure confirmed with present staff. Received instructions for my participation in the procedure from the performing physician.  

## 2018-08-31 NOTE — Patient Instructions (Signed)
Impression/Recommendations:  Resume previous diet. Continue present medications.  No aspirin, ibuprofen, naproxen, or other NSAID drugs.  Tylenol only.  Repeat colonoscopy in 10 years for screening.  Return to GI office as needed.  YOU HAD AN ENDOSCOPIC PROCEDURE TODAY AT [facility]:   Refer to the procedure report that was given to you for any specific questions about what was found during the examination.  If the procedure report does not answer your questions, please call your gastroenterologist to clarify.  If you requested that your care partner not be given the details of your procedure findings, then the procedure report has been included in a sealed envelope for you to review at your convenience later.  YOU SHOULD EXPECT: Some feelings of bloating in the abdomen. Passage of more gas than usual.  Walking can help get rid of the air that was put into your GI tract during the procedure and reduce the bloating. If you had a lower endoscopy (such as a colonoscopy or flexible sigmoidoscopy) you may notice spotting of blood in your stool or on the toilet paper. If you underwent a bowel prep for your procedure, you may not have a normal bowel movement for a few days.  Please Note:  You might notice some irritation and congestion in your nose or some drainage.  This is from the oxygen used during your procedure.  There is no need for concern and it should clear up in a day or so.  SYMPTOMS TO REPORT IMMEDIATELY:   Following lower endoscopy (colonoscopy or flexible sigmoidoscopy):  Excessive amounts of blood in the stool  Significant tenderness or worsening of abdominal pains  Swelling of the abdomen that is new, acute  Fever of 100F or higher   Following upper endoscopy (EGD)  Vomiting of blood or coffee ground material  New chest pain or pain under the shoulder blades  Painful or persistently difficult swallowing  New shortness of breath  Fever of 100F or  higher  Black, tarry-looking stools  For urgent or emergent issues, a gastroenterologist can be reached at any hour by calling [phone].   DIET:  We do recommend a small meal at first, but then you may proceed to your regular diet.  Drink plenty of fluids but you should avoid alcoholic beverages for 24 hours.  ACTIVITY:  You should plan to take it easy for the rest of today and you should NOT DRIVE or use heavy machinery until tomorrow (because of the sedation medicines used during the test).    FOLLOW UP: Our staff will call the number listed on your records the next business day following your procedure to check on you and address any questions or concerns that you may have regarding the information given to you following your procedure. If we do not reach you, we will leave a message.  However, if you are feeling well and you are not experiencing any problems, there is no need to return our call.  We will assume that you have returned to your regular daily activities without incident.  If any biopsies were taken you will be contacted by phone or by letter within the next 1-3 weeks.  Please call us at [phone] if you have not heard about the biopsies in 3 weeks.    SIGNATURES/CONFIDENTIALITY: You and/or your care partner have signed paperwork which will be entered into your electronic medical record.  These signatures attest to the fact that that the information above on your After  Visit Summary has been reviewed and is understood.  Full responsibility of the confidentiality of this discharge information lies with you and/or your care-partner.

## 2018-08-31 NOTE — Op Note (Signed)
[facility] Patient Name: Brenda [name] Procedure Date: 08/31/2018 2:53 PM MRN: [id number] Endoscopist: Thornton Park MD, MD Age: 67 Referring MD:  Date of Birth: 05/28/1951 Gender: Female Account #: [id number] Procedure:                Colonoscopy Indications:              Unexplained iron deficiency anemia Medicines:                See the Anesthesia note for documentation of the                            administered medications Procedure:                Pre-Anesthesia Assessment:                           - Prior to the procedure, a History and Physical                            was performed, and patient medications and                            allergies were reviewed. The patient's tolerance of                            previous anesthesia was also reviewed. The risks                            and benefits of the procedure and the sedation                            options and risks were discussed with the patient.                            All questions were answered, and informed consent                            was obtained. Prior Anticoagulants: The patient has                            taken no previous anticoagulant or antiplatelet                            agents. ASA Grade Assessment: III - A patient with                            severe systemic disease. After reviewing the risks                            and benefits, the patient was deemed in                            satisfactory condition to undergo the procedure.  After obtaining informed consent, the colonoscope                            was passed under direct vision. Throughout the                            procedure, the patient's blood pressure, pulse, and                            oxygen saturations were monitored continuously. The                            Colonoscope was introduced through the anus and                            advanced to the the  terminal ileum, with                            identification of the appendiceal orifice and IC                            valve. The colonoscopy was performed without                            difficulty. The patient tolerated the procedure                            well. The quality of the bowel preparation was good. Scope In: 3:05:53 PM Scope Out: 3:17:48 PM Scope Withdrawal Time: 0 hours 9 minutes 16 seconds  Total Procedure Duration: 0 hours 11 minutes 55 seconds  Findings:                 The perianal and digital rectal examinations were                            normal.                           The colon (entire examined portion) appeared normal.                           The terminal ileum appeared normal.                           The exam was otherwise without abnormality on                            direct and retroflexion views. Complications:            No immediate complications. Estimated Blood Loss:     Estimated blood loss: none. Impression:               - The entire examined colon is normal.                           - The examined portion of the  ileum was normal.                           - The examination was otherwise normal on direct                            and retroflexion views.                           - No specimens collected. Recommendation:           - Discharge patient to home.                           - Resume previous diet today.                           - Continue present medications.                           - Repeat colonoscopy in 10 years for screening                            purposes if it is clinically appropriate at that                            time.                           - Return to the office as needed. Thornton Park MD, MD 08/31/2018 3:33:27 PM This report has been signed electronically.

## 2018-08-31 NOTE — Progress Notes (Signed)
Report given to PACU, vss 

## 2018-08-31 NOTE — Op Note (Addendum)
[facility] Patient Name: Brenda [name] Procedure Date: 08/31/2018 2:53 PM MRN: [id number] Endoscopist: Thornton Park MD, MD Age: 67 Referring MD:  Date of Birth: [birth year] Gender: Female Account #: [id number] Procedure:                Upper GI endoscopy Indications:              Unexplained iron deficiency anemia, Dysphagia Medicines:                See the Anesthesia note for documentation of the                            administered medications Procedure:                Pre-Anesthesia Assessment:                           - Prior to the procedure, a History and Physical                            was performed, and patient medications and                            allergies were reviewed. The patient's tolerance of                            previous anesthesia was also reviewed. The risks                            and benefits of the procedure and the sedation                            options and risks were discussed with the patient.                            All questions were answered, and informed consent                            was obtained. Prior Anticoagulants: The patient has                            taken no previous anticoagulant or antiplatelet                            agents. ASA Grade Assessment: III - A patient with                            severe systemic disease. After reviewing the risks                            and benefits, the patient was deemed in                            satisfactory condition to undergo the procedure.  After obtaining informed consent, the endoscope was                            passed under direct vision. Throughout the                            procedure, the patient's blood pressure, pulse, and                            oxygen saturations were monitored continuously. The                            Endoscope was introduced through the mouth, and   advanced to the third part of duodenum. The upper                            GI endoscopy was accomplished without difficulty.                            The patient tolerated the procedure well. Scope In: Scope Out: Findings:                 The examined esophagus was normal. Biopsies were                            taken with a cold forceps for histology.                           There is a small hiatal hernia. Multiple large                            erosions were found in the gastric body. There were                            no stigmata of recent bleeding. Biopsies were taken                            with a cold forceps for histology.                           Diffuse mildly erythematous mucosa without active                            bleeding and with no stigmata of bleeding was found                            in the duodenal bulb. Biopsies were taken with a                            cold forceps for histology.                           The exam was otherwise without abnormality. Complications:            No immediate complications.  Estimated Blood Loss:     Estimated blood loss: none. Impression:               - Normal esophagus. Biopsied.                           - Small hiatal hernia.                           - Erosive gastropathy. Biopsied.                           - Erythematous duodenopathy. Biopsied.                           - The examination was otherwise normal. Recommendation:           - Await pathology results.                           - No aspirin, ibuprofen, naproxen, or other                            non-steroidal anti-inflammatory drugs.                           - Proceed with colonoscopy today as previously                            planned. Thornton Park MD, MD 08/31/2018 3:29:56 PM This report has been signed electronically.

## 2018-09-01 ENCOUNTER — Telehealth: Payer: Self-pay | Admitting: *Deleted

## 2018-09-01 ENCOUNTER — Telehealth: Payer: Self-pay

## 2018-09-01 NOTE — Telephone Encounter (Signed)
No answer, left message to call if having any problems, B.Karel Mowers RN

## 2018-09-01 NOTE — Telephone Encounter (Signed)
Message left

## 2018-09-07 MED FILL — DOXAZOSIN MESYLATE 2 MG TAB: 2 | 30 days supply | Qty: 30 | Fill #5

## 2018-09-08 ENCOUNTER — Other Ambulatory Visit: Payer: Self-pay | Admitting: Internal Medicine

## 2018-09-08 MED FILL — metFORMIN HCL ER 500 MG TB2: 500 | 90 days supply | Qty: 360 | Fill #0

## 2018-09-08 MED FILL — ALPRAZolam 1 MG TABS: 1 | 30 days supply | Qty: 60 | Fill #1

## 2018-09-13 ENCOUNTER — Ambulatory Visit
Admission: RE | Admit: 2018-09-13 | Discharge: 2018-09-13 | Disposition: A | Discharge status: Home / Self Care | Payer: Medicare Other | Source: Ambulatory Visit | Attending: Family Medicine | Admitting: Family Medicine

## 2018-09-13 DIAGNOSIS — Z1231 Encounter for screening mammogram for malignant neoplasm of breast: Secondary | ICD-10-CM | POA: Diagnosis not present

## 2018-09-15 ENCOUNTER — Other Ambulatory Visit: Payer: Self-pay | Admitting: Family Medicine

## 2018-09-15 DIAGNOSIS — R928 Other abnormal and inconclusive findings on diagnostic imaging of breast: Secondary | ICD-10-CM

## 2018-09-22 ENCOUNTER — Ambulatory Visit
Admission: RE | Admit: 2018-09-22 | Discharge: 2018-09-22 | Disposition: A | Discharge status: Home / Self Care | Payer: Medicare Other | Source: Ambulatory Visit | Attending: Family Medicine | Admitting: Family Medicine

## 2018-09-22 DIAGNOSIS — R928 Other abnormal and inconclusive findings on diagnostic imaging of breast: Secondary | ICD-10-CM

## 2018-09-22 DIAGNOSIS — C9110 Chronic lymphocytic leukemia of B-cell type not having achieved remission: Secondary | ICD-10-CM | POA: Diagnosis not present

## 2018-09-27 ENCOUNTER — Encounter: Payer: Self-pay | Admitting: Internal Medicine

## 2018-09-27 ENCOUNTER — Ambulatory Visit (INDEPENDENT_AMBULATORY_CARE_PROVIDER_SITE_OTHER): Payer: Medicare Other | Admitting: Internal Medicine

## 2018-09-27 VITALS — BP 148/80 | HR 82 | Ht 61.0 in | Wt 187.0 lb

## 2018-09-27 DIAGNOSIS — Z6835 Body mass index (BMI) 35.0-35.9, adult: Secondary | ICD-10-CM

## 2018-09-27 DIAGNOSIS — E1165 Type 2 diabetes mellitus with hyperglycemia: Secondary | ICD-10-CM | POA: Diagnosis not present

## 2018-09-27 DIAGNOSIS — I25118 Atherosclerotic heart disease of native coronary artery with other forms of angina pectoris: Secondary | ICD-10-CM | POA: Diagnosis not present

## 2018-09-27 DIAGNOSIS — E782 Mixed hyperlipidemia: Secondary | ICD-10-CM

## 2018-09-27 LAB — POCT GLYCOSYLATED HEMOGLOBIN (HGB A1C): Hemoglobin A1C: 6.6 % — AB (ref 4.0–5.6)

## 2018-09-27 MED ORDER — GLIPIZIDE ER 2.5 MG PO TB24: ORAL_TABLET | ORAL | 3 refills | Status: DC

## 2018-09-27 MED ORDER — GLIPIZIDE 5 MG PO TABS: 5.0000 mg | ORAL_TABLET | Freq: Every day | ORAL | 3 refills | Status: DC

## 2018-09-27 MED ORDER — LANCING DEVICE MISC: 0 refills | Status: DC

## 2018-09-27 MED FILL — AMOXICILLIN 500 MG CAPSULE: 500 | 1 days supply | Qty: 4 | Fill #0

## 2018-09-27 MED FILL — glipiZIDE 5 MG TABS: 5 | 90 days supply | Qty: 90 | Fill #0

## 2018-09-27 NOTE — Patient Instructions (Addendum)
Please continue: - Metformin ER 1000 mg 2x a day  Please decrease; - Glipizide ER 2.5 mg before breakfast  Start: - Glipizide 2.5-5 mg before dinner  Please return in 4 months with your sugar log.

## 2018-09-27 NOTE — Addendum Note (Signed)
Addended by: Cardell Peach I on: 09/27/2018 11:07 AM   Modules accepted: Orders

## 2018-09-27 NOTE — Progress Notes (Signed)
Patient ID: Brenda [name], female   DOB: Oct 31, 1951, 67 y.o.   MRN: [id number]  HPI: Brenda [name] is a 67 y.o.-year-old female, returning for follow-up for DM2, dx in 2007, non-insulin-dependent, uncontrolled, with complications (CKD). Last visit 4 months ago.  Last hemoglobin A1c was: Lab Results  Component Value Date   HGBA1C 6.2 (A) 05/24/2018   HGBA1C 5.7 01/20/2018   HGBA1C 6.5 08/22/2017   Pt is on a regimen of: - Metformin ER 1000 mg 2x a day - Glipizide ER 2.5 mg before breakfast and dinner  >> stopped 11/2017 cause of low blood sugars She was on Glipizide in 11/2015 after a steroid inj. At last visit, we stopped Glipizide 5 mg 2x a day, before meals, because of lows.  Pt checks her sugars twice a day: - am: 52x1, 78-128 >> 83-139, 144, 162 >> 94-168, 190 - 2h after b'fast: 156 >> n/c >> 148 >> n/c - before lunch: 69-87 >> 74-119 >> n/c - 2h after lunch: 118, 123 >> 92, 99 >> n/c - before dinner:64-102, 121 >> 66, 71-127, 140 >> 90-138, 141 - 2h after dinner:n/c >> 138, 148 >> n/c - bedtime: 74, 80-170 >> 91-143 >> n/c - nighttime: n/c Lowest sugar was 37 x1 >> ...>> 52 >> 66 x1 >> 94; she has hypoglycemia awareness in the 70s. Highest sugar was 260 x 1 >> 150 >> 162 >> 190.  Glucometer: Freestyle Lite  Pt's meals are: - Breakfast: English muffin + preserve or cereal or yoghurt - snack: fruit or fruit + yoghurt - Lunch: 1/2 sandwich + chips + salsa + fruit/sugar free - Dinner: soup or chicken/steak + veggies, occasional starch or Lean cuisine or light salad No soft drinks.  -+ CKD, last BUN/creatinine:  Lab Results  Component Value Date   BUN 30 (H) 08/18/2018   CREATININE 1.30 (H) 08/18/2018  On benazepril. -+ HL; last set of lipids: Lab Results  Component Value Date   CHOL 168 04/12/2018   HDL 34 (L) 04/12/2018   LDLCALC 85 04/12/2018   LDLDIRECT 88.0 03/07/2017   TRIG 247 (H) 04/12/2018   CHOLHDL 4.9 04/12/2018  Off simvastatin because of leg  cramps.  On Zetia - last eye exam 03/2018: No DR she had cataract surgery in 2016.  She has a history of retinal tear. -No numbness and tingling in her feet.  On Neurontin for leg cramps at night.  She also has HTN, GERD, depression  She was admitted 11/2017 for dehydration, anemia, and acute renal failure >> diagnosed with CLL.  ROS: Constitutional: no weight gain/no weight loss, + fatigue, no subjective hyperthermia, no subjective hypothermia Eyes: no blurry vision, no xerophthalmia ENT: no sore throat, no nodules palpated in neck, no dysphagia, no odynophagia, no hoarseness Cardiovascular: no CP/no SOB/no palpitations/no leg swelling Respiratory: no cough/no SOB/no wheezing Gastrointestinal: no N/no V/no D/no C/no acid reflux Musculoskeletal: no muscle aches/no joint aches Skin: no rashes, no hair loss Neurological: no tremors/no numbness/no tingling/no dizziness  I reviewed pt's medications, allergies, PMH, social hx, family hx, and changes were documented in the history of present illness. Otherwise, unchanged from my initial visit note.  Past Medical History:  Diagnosis Date  . Anxiety   . Arthritis   . CLL (chronic lymphocytic leukemia) ([place])   . Depression    Wellbutrin  . Diabetes mellitus    Type II  . Endometrial ca ([place])    1998  . Foot fracture, left    "non-union fracture that  happened 30-40 years ago"  . GERD (gastroesophageal reflux disease)   . Heart murmur    patient states "cardiologist has not heard heart murmur for past several years"  . Heel spur   . Hyperlipidemia   . Hypertension   . Left leg swelling    "swelling in left leg from knee down when sitting for prolonged periods or being on feet for a long period of time"  . PONV (postoperative nausea and vomiting)    after hysterectomy  . Renal insufficiency    Past Surgical History:  Procedure Laterality Date  . ABDOMINAL HYSTERECTOMY  1998  . COLONOSCOPY    . EYE SURGERY Bilateral    cataracts   . EYE SURGERY Left    retina tear  . FEMUR IM NAIL  10/25/2012   Procedure: INTRAMEDULLARY (IM) NAIL FEMORAL;  Surgeon: Mauri Pole, MD;  Location: [facility];  Service: Orthopedics;  Laterality: Left;  . HIP SURGERY    . left knee arthroscopy  12/2010  . REFRACTIVE SURGERY    . TOTAL KNEE ARTHROPLASTY  12/20/2011   Procedure: TOTAL KNEE ARTHROPLASTY;  Surgeon: Gearlean Alf, MD;  Location: [facility];  Service: Orthopedics;  Laterality: Left;  Failed attempt at spinal   . TOTAL SHOULDER ARTHROPLASTY Right 08/19/2016   Procedure: RIGHT TOTAL SHOULDER ARTHROPLASTY;  Surgeon: Justice Britain, MD;  Location: [facility];  Service: Orthopedics;  Laterality: Right;   Social History   Social History  . Marital Status: Single    Spouse Name: N/A  . Number of Children: 0   Occupational History  . RN at [employer] History Main Topics  . Smoking status: Never Smoker   . Smokeless tobacco: Never Used  . Alcohol Use: No  . 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 ALPRAZolam (XANAX) 1 MG tablet TAKE 1/2-1 TABLET BY MOUTH 2 TIMES A DAY AS NEEDED 60 tablet 5  . aspirin 81 MG tablet Take 81 mg by mouth daily.    . benazepril-hydrochlorthiazide (LOTENSIN HCT) 20-12.5 MG tablet Take 1 tablet by mouth daily.  3  . Blood Glucose Monitoring Suppl (FREESTYLE FREEDOM LITE) w/Device KIT Use to check blood sugar twice a day, E11.65 1 each 1  . buPROPion (WELLBUTRIN XL) 300 MG 24 hr tablet TAKE 1 TABLET BY MOUTH DAILY. 90 tablet 3  . Cholecalciferol (VITAMIN D3) 2000 units TABS Take 2,000 mcg by mouth daily.    . Cyanocobalamin (B-12) 1000 MCG SUBL Place 1 tablet under the tongue daily.    Marland Kitchen doxazosin (CARDURA) 2 MG tablet Take 1 tablet (2 mg total) by mouth daily. 30 tablet 6  . Esomeprazole Magnesium (NEXIUM PO) Take 22.5 mg by mouth daily.    Marland Kitchen ezetimibe (ZETIA) 10 MG tablet Take 1  tablet (10 mg total) by mouth daily. 90 tablet 3  . gabapentin (NEURONTIN) 300 MG capsule Take 1 capsule (300 mg total) by mouth at bedtime. 90 capsule 3  . glipiZIDE (GLUCOTROL XL) 2.5 MG 24 hr tablet Take 2.5 MG twice a day with meal. 180 tablet 1  . glucose blood (FREESTYLE LITE) test strip TEST BLOOD SUGAR TWICE DAILY 200 each 0  . iron polysaccharides (NIFEREX) 150 MG capsule Take 150 mg by mouth daily.    . Lancets (FREESTYLE) lancets Use to check blood sugar twice a day,  E11.65 200 each 12  . metFORMIN (GLUCOPHAGE-XR) 500 MG 24 hr tablet TAKE 2 TABLETS BY MOUTH 2 TIMES DAILY WITH A MEAL. 360 tablet 0  . metoprolol succinate (TOPROL-XL) 25 MG 24 hr tablet TAKE 1 TABLET BY MOUTH DAILY. 90 tablet 2  . Multiple Vitamins-Minerals (HM MULTIVITAMIN ADULT GUMMY PO) Take 1 tablet by mouth daily.    . simvastatin (ZOCOR) 40 MG tablet Take 40 mg by mouth daily.     No current facility-administered medications on file prior to visit.    Allergies  Allergen Reactions  . Amlodipine     Swelling     Family History  Problem Relation Age of Onset  . Cancer Mother        breast cancer  . Cancer Father        plasma sarcoma  . Breast cancer Neg Hx    PE: BP (!) 148/80   Pulse 82   Ht _0  (1.549 m) Comment: measured  Wt 187 lb (84.8 kg)   SpO2 97%   BMI 35.33 kg/m  Body mass index is 35.33 kg/m. Wt Readings from Last 3 Encounters:  09/27/18 187 lb (84.8 kg)  08/31/18 190 lb (86.2 kg)  08/17/18 190 lb 4 oz (86.3 kg)   Constitutional: overweight, in NAD Eyes: PERRLA, EOMI, no exophthalmos ENT: moist mucous membranes, no thyromegaly, no cervical lymphadenopathy Cardiovascular: RRR, No MRG, + L LE edema Respiratory: CTA B Gastrointestinal: abdomen soft, NT, ND, BS+ Musculoskeletal: no deformities, strength intact in all 4 Skin: moist, warm, no rashes Neurological: no tremor with outstretched hands, DTR normal in all 4  ASSESSMENT: 1. DM2, non-insulin-dependent, uncontrolled, with  complications and hyperglycemia - mild CKD  2. Obesity  3. HL  PLAN:  1. Patient with long-standing, previously uncontrolled diabetes, with much improved control in the last year, initially on Victoza, but now off the medication due to nausea.  During the summer we reduced her glipizide dose as she was having low blood sugars in the 50s and 60s.  However, since then, we had to increase the glipizide again due to increased blood sugars.  She also continues on metformin ER, which she is tolerating well.  Latest HbA1c was higher, at 6.2%, but still at goal. - sugars are slightly higher in am >> will increase Glipizide before dinner, but will switch to regular glipizide - I suggested to:  Patient Instructions  Please continue: - Metformin ER 1000 mg 2x a day  Please decrease; - Glipizide ER 2.5 mg before breakfast  Start: - Glipizide 2.5-5 mg before dinner  Please return in 4 months with your sugar log.   - today, HbA1c is 6.6% (higher) - continue checking sugars at different times of the day - check 1x a day, rotating checks - advised for yearly eye exams >> she is UTD - UTD with flu shot - Return to clinic in 4 mo with sugar log     2. Obesity -She gained only 2 lbs since last visit  3.  Hyperlipidemia - Reviewed latest lipid panel from 04/2018: LDL at goal, triglycerides high, HDL slightly low Lab Results  Component Value Date   CHOL 168 04/12/2018   HDL 34 (L) 04/12/2018   LDLCALC 85 04/12/2018   LDLDIRECT 88.0 03/07/2017   TRIG 247 (H) 04/12/2018   CHOLHDL 4.9 04/12/2018  -She is not on statins due to previous intolerance.  On Zetia.  Philemon Kingdom, MD PhD [name] Endocr[facility]

## 2018-09-29 MED FILL — glipiZIDE ER 2.5 MG TB24: 2.5 | 45 days supply | Qty: 90 | Fill #0

## 2018-10-02 MED FILL — GABAPENTIN 300 MG CAPSULE: 300 | 90 days supply | Qty: 90 | Fill #1

## 2018-10-09 MED FILL — DOXAZOSIN MESYLATE 2 MG TAB: 2 | 30 days supply | Qty: 30 | Fill #6

## 2018-10-09 MED FILL — BENAZEPRIL-HYDROCHLOROTHIAZ: 20-12.5 | 90 days supply | Qty: 90 | Fill #2

## 2018-10-10 MED FILL — SIMVASTATIN 40 MG TABLET: 40 | 90 days supply | Qty: 90 | Fill #1

## 2018-10-10 MED FILL — ALPRAZolam 1 MG TABS: 1 | 30 days supply | Qty: 60 | Fill #2

## 2018-10-10 NOTE — Progress Notes (Signed)
Patient ID: Brenda [name], female   DOB: December 14, 1950, 67 y.o.   MRN: [id number] Cardiology Office Note  Date:  10/11/2018   ID:  Brenda [name], Brenda [name] 1951/08/12, MRN [id number]  PCP:  Lucille Passy, MD   Chief Complaint  Patient presents with  . other    6 mo follow up.Medications reviewed verbally.    HPI:  Brenda [name] is a  67 -year-old woman with history of coronary artery disease, CT coronary calcium score 1031 obesity, diabetes, poorly controlled, hemoglobin A1c previously of 12 down to 5.7 hyperlipidemia,  with previous stress test and echocardiogram for abnormal EKG,  CLL s/p shoulder replacement surgery  who presents for routine followup of her coronary artery disease and hyperlipidemia.  Stress concernign enlarged lymph nodes in axilla, Possibly from CLL  Chronic leg swelling, left > right Pain left hip,  No regular exercise program Limited by arthritic pain  Labs reviewed HCT 12.2 HBA1C 6.6 CR 1.3 Total chol 168, LDL 85  Discussed renal insufficiency, does not drink much fluids in the daytime  EKG personally reviewed by myself on todays visit Shows nsr with rate 75 bpm, no significant ST or T wave changes  Other past medical history reviewed In the hospital 11/2017 Hospital records reviewed with the patient in detail Dehydrated Lung nodules, enlarged lymph nodes Anemic  HBG 6.6 after hydration, iron def, stool negative BP was low. Acute renal failure  CT coronary calcium score showing diffuse LAD disease, RCA disease Dramatically less left circumflex disease   PMH:   has a past medical history of Anxiety, Arthritis, CLL (chronic lymphocytic leukemia) ([place]), Depression, Diabetes mellitus, Endometrial ca [facility]), Foot fracture, left, GERD (gastroesophageal reflux disease), Heart murmur, Heel spur, Hyperlipidemia, Hypertension, Left leg swelling, PONV (postoperative nausea and vomiting), and Renal insufficiency.  PSH:    Past Surgical History:   Procedure Laterality Date  . ABDOMINAL HYSTERECTOMY  1998  . COLONOSCOPY    . EYE SURGERY Bilateral    cataracts  . EYE SURGERY Left    retina tear  . FEMUR IM NAIL  10/25/2012   Procedure: INTRAMEDULLARY (IM) NAIL FEMORAL;  Surgeon: Mauri Pole, MD;  [facility];  Service: Orthopedics;  Laterality: Left;  . HIP SURGERY    . left knee arthroscopy  12/2010  . REFRACTIVE SURGERY    . TOTAL KNEE ARTHROPLASTY  12/20/2011   Procedure: TOTAL KNEE ARTHROPLASTY;  Surgeon: Gearlean Alf, MD;  [facility];  Service: Orthopedics;  Laterality: Left;  Failed attempt at spinal   . TOTAL SHOULDER ARTHROPLASTY Right 08/19/2016   Procedure: RIGHT TOTAL SHOULDER ARTHROPLASTY;  Surgeon: Justice Britain, MD;  Location: [facility];  Service: Orthopedics;  Laterality: Right;    Current Outpatient Medications  Medication Sig Dispense Refill  . ALPRAZolam (XANAX) 1 MG tablet TAKE 1/2-1 TABLET BY MOUTH 2 TIMES A DAY AS NEEDED 60 tablet 5  . aspirin 81 MG tablet Take 81 mg by mouth daily.    . benazepril-hydrochlorthiazide (LOTENSIN HCT) 20-12.5 MG tablet Take 1 tablet by mouth daily.  3  . Blood Glucose Monitoring Suppl (FREESTYLE FREEDOM LITE) w/Device KIT Use to check blood sugar twice a day, E11.65 1 each 1  . buPROPion (WELLBUTRIN XL) 300 MG 24 hr tablet TAKE 1 TABLET BY MOUTH DAILY. 90 tablet 3  . Cholecalciferol (VITAMIN D3) 2000 units TABS Take 2,000 mcg by mouth daily.    . Cyanocobalamin (B-12) 1000 MCG SUBL Place 1 tablet under the tongue daily.    Marland Kitchen  doxazosin (CARDURA) 2 MG tablet Take 1 tablet (2 mg total) by mouth daily. 30 tablet 6  . Esomeprazole Magnesium (NEXIUM PO) Take 22.5 mg by mouth daily.    Marland Kitchen gabapentin (NEURONTIN) 300 MG capsule Take 1 capsule (300 mg total) by mouth at bedtime. 90 capsule 3  . glipiZIDE (GLUCOTROL XL) 2.5 MG 24 hr tablet Take 2.5 MG  By mouth 1x a day before b'fast 90 tablet 3  . glipiZIDE (GLUCOTROL) 5 MG tablet Take 1 tablet (5 mg total) by mouth daily  before supper. 90 tablet 3  . glucose blood (FREESTYLE LITE) test strip TEST BLOOD SUGAR TWICE DAILY 200 each 0  . iron polysaccharides (NIFEREX) 150 MG capsule Take 150 mg by mouth daily.    Elmore Guise Devices (LANCING DEVICE) MISC Use as advised - Freestyle 1 each 0  . Lancets (FREESTYLE) lancets Use to check blood sugar twice a day, E11.65 200 each 12  . metFORMIN (GLUCOPHAGE-XR) 500 MG 24 hr tablet TAKE 2 TABLETS BY MOUTH 2 TIMES DAILY WITH A MEAL. 360 tablet 0  . metoprolol succinate (TOPROL-XL) 25 MG 24 hr tablet TAKE 1 TABLET BY MOUTH DAILY. 90 tablet 2  . Multiple Vitamins-Minerals (HM MULTIVITAMIN ADULT GUMMY PO) Take 1 tablet by mouth daily.    . simvastatin (ZOCOR) 40 MG tablet Take 40 mg by mouth daily.    Marland Kitchen ezetimibe (ZETIA) 10 MG tablet Take 1 tablet (10 mg total) by mouth daily. 90 tablet 3   No current facility-administered medications for this visit.     Allergies:   Amlodipine   Social History:  The patient  reports that she has never smoked. She has never used smokeless tobacco. She reports that she does not drink alcohol or use drugs.   Family History:   family history includes Cancer in her father and mother.    Review of Systems: Review of Systems  Constitutional: Negative.   HENT: Negative.   Respiratory: Negative.   Cardiovascular: Positive for leg swelling.  Gastrointestinal: Negative.   Musculoskeletal: Negative.   Neurological: Negative.   Psychiatric/Behavioral: Negative.   All other systems reviewed and are negative.    PHYSICAL EXAM: VS:  BP (!) 144/74 (BP Location: Left Arm, Patient Position: Sitting, Cuff Size: Normal)   Pulse 75   Wt 196 lb (88.9 kg)   BMI 37.03 kg/m  , BMI Body mass index is 37.03 kg/m.  Constitutional:  oriented to person, place, and time. No distress. obese HENT:  Head: Grossly normal Eyes:  no discharge. No scleral icterus.  Neck: No JVD, no carotid bruits  Cardiovascular: Regular rate and rhythm, no murmurs  appreciated Nonpitting lower extremity edema left greater than right Pulmonary/Chest: Clear to auscultation bilaterally, no wheezes or rails Abdominal: Soft.  no distension.  no tenderness.  Musculoskeletal: Normal range of motion Neurological:  normal muscle tone. Coordination normal. No atrophy Skin: Skin warm and dry Psychiatric: normal affect, pleasant   Recent Labs: 11/19/2017: TSH 1.643 07/05/2018: Hemoglobin 12.2; Platelets 165.0 08/18/2018: ALT 12; BUN 30; Creat 1.30; Potassium 4.7; Sodium 140    Lipid Panel Lab Results  Component Value Date   CHOL 168 04/12/2018   HDL 34 (L) 04/12/2018   LDLCALC 85 04/12/2018   TRIG 247 (H) 04/12/2018      Wt Readings from Last 3 Encounters:  10/11/18 196 lb (88.9 kg)  09/27/18 187 lb (84.8 kg)  08/31/18 190 lb (86.2 kg)      ASSESSMENT AND PLAN:  Essential hypertension -  Low at work, she will monitor at home and call us No changes today  Tachycardia -   continue metoprolol not having sx  Type 2 diabetes mellitus with hyperglycemia, without long-term current use of insulin ([place]) Working with endocrine, HBA1C stable 6.6 Recommend low carbohydrates, water aerobics for weight loss  Obesity Recommended water aerobics, Low carbs  HLD (hyperlipidemia) Tolerating simvastatin, zetia Recheck in April Ideally would like LDL less than 70 She does report occasional noncompliance with the simvastatin  Anemia Iron deficiency HCT back up  Disposition:   F/U  12 months   Total encounter time more than 25 minutes  Greater than 50% was spent in counseling and coordination of care with the patient    Orders Placed This Encounter  Procedures  . EKG 12-Lead   Signed, Esmond Plants, M.D., Ph.D. 10/11/2018  [place], [place]

## 2018-10-11 ENCOUNTER — Ambulatory Visit (INDEPENDENT_AMBULATORY_CARE_PROVIDER_SITE_OTHER): Payer: Medicare Other | Admitting: Cardiovascular Disease

## 2018-10-11 ENCOUNTER — Encounter: Payer: Self-pay | Admitting: Cardiovascular Disease

## 2018-10-11 ENCOUNTER — Other Ambulatory Visit: Payer: Medicare Other

## 2018-10-11 VITALS — BP 144/74 | HR 75 | Wt 196.0 lb

## 2018-10-11 DIAGNOSIS — E782 Mixed hyperlipidemia: Secondary | ICD-10-CM

## 2018-10-11 DIAGNOSIS — E1165 Type 2 diabetes mellitus with hyperglycemia: Secondary | ICD-10-CM | POA: Diagnosis not present

## 2018-10-11 DIAGNOSIS — I10 Essential (primary) hypertension: Secondary | ICD-10-CM

## 2018-10-11 DIAGNOSIS — C9110 Chronic lymphocytic leukemia of B-cell type not having achieved remission: Secondary | ICD-10-CM

## 2018-10-11 DIAGNOSIS — N189 Chronic kidney disease, unspecified: Secondary | ICD-10-CM

## 2018-10-11 DIAGNOSIS — N179 Acute kidney failure, unspecified: Secondary | ICD-10-CM | POA: Diagnosis not present

## 2018-10-11 DIAGNOSIS — I25118 Atherosclerotic heart disease of native coronary artery with other forms of angina pectoris: Secondary | ICD-10-CM | POA: Diagnosis not present

## 2018-10-11 NOTE — Patient Instructions (Addendum)
Hydrate, Check home blood pressures, also standing Cholesterol recheck in April 2020   Medication Instructions:  No changes  If you need a refill on your cardiac medications before your next appointment, please call your pharmacy.    Lab work: No new labs needed   If you have labs (blood work) drawn today and your tests are completely normal, you will receive your results only by: Marland Kitchen MyChart Message (if you have MyChart) OR . A paper copy in the mail If you have any lab test that is abnormal or we need to change your treatment, we will call you to review the results.   Testing/Procedures: No new testing needed   Follow-Up: At [facility], you and your health needs are our priority.  As part of our continuing mission to provide you with exceptional heart care, we have created designated Provider Care Teams.  These Care Teams include your primary Cardiologist (physician) and Advanced Practice Providers (APPs -  Physician Assistants and Nurse Practitioners) who all work together to provide you with the care you need, when you need it.  . You will need a follow up appointment in 12 months .   Please call our office 2 months in advance to schedule this appointment.    . Providers on your designated Care Team:   . Murray Hodgkins, NP . Christell Faith, PA-C . Marrianne Mood, PA-C  Any Other Special Instructions Will Be Listed Below (If Applicable).  For educational health videos Log in to : [email or web address] Or : [email or web address], password : [email or web address]

## 2018-10-30 MED FILL — buPROPion HCL ER (XL) 300 M: 300 | 90 days supply | Qty: 90 | Fill #2

## 2018-10-30 MED FILL — EZETIMIBE 10 MG TABS: 10 | 90 days supply | Qty: 90 | Fill #2

## 2018-11-02 MED FILL — AMOXICILLIN 500 MG CAPSULE: 500 | 1 days supply | Qty: 4 | Fill #1

## 2018-11-07 ENCOUNTER — Other Ambulatory Visit: Payer: Self-pay | Admitting: Cardiovascular Disease

## 2018-11-07 NOTE — Telephone Encounter (Signed)
Please advise if ok to refill Doxazosin.

## 2018-11-09 MED FILL — DOXAZOSIN MESYLATE 2 MG TAB: 2 | 30 days supply | Qty: 30 | Fill #0

## 2018-11-09 NOTE — Progress Notes (Signed)
HEMATOLOGY/ONCOLOGY CLINIC NOTE  Date of Service: 11/10/18    Patient Care Team: Lucille Passy, MD as PCP - General Rockey Situ Kathlene November, MD as Consulting Physician (Cardiology)  CHIEF COMPLAINTS/PURPOSE OF CONSULTATION:  F/u for continue mx of CLL  HISTORY OF PRESENTING ILLNESS:   Brenda [name] is a wonderful 68 y.o. female who was hospitalized on 11/17/17 for abdominal discomfort, nausea and anorexia.   Further workup showed elevated kidney function tests and CT of the abd and pelvis demonstrated pelvic lymphadenopathy and innumerable pulmonary nodulesin the bases concerning for metastases.  Today she is here for an outpatient follow up accompanied by her friend. She notes she has not felt her usual self in the past 2 months with fatigue and lack of appetite. She also noticed diarrhea that comes and goes and her energy level would continue to drop. She has lost 25-30 pounds since end of summer 2018. She will notice mucous with her stool but no bloody or black stool. She does not take any meds that she would associate with her diarrhea or lower appetite and she denies change in her chronic medications. She has been on Nexium for a long period of time. She notes she does not often get sick. Dr. Deborra Medina is her PCP. She was using 263m ibuprofen 2-3 times a day more often recently due to her hip pain.   She notes her mother had breast cancer and diagnosed at 554yo. Her father had a rare form of cancer who died 3 weeks after diagnosis in his 84s Her brother passed from non-alcohol liver cirrhosis.   On review of symptoms, pt notes since her discharge she no longer has abdominal pain but will still have occasional diarrhea. She has not had diarrhea in several days. She notes to having a slight cough but mostly sinus drainage. Her appetite and eating has improved. She notes due to her knee replacement and hip fracture she will have swelling in her left leg. She has lower back pain at times. She  denies feeling lumps or bumps in her axilla b/l or groin. She denies dysuria or change in her urination.   Interval History:  Brenda GWILLIAMreturns today regarding her Chronic Lymphocytic Leukemia. The patient's last visit with [place] on 05/10/18. The pt reports that she is doing well overall.   In the interim, the pt had a colonoscopy and an upper endoscopy completed on 08/31/18. Colonoscopy was largely unremarkable, and endoscopy revealed small hiatal hernia, and biopsied sites were consistent with reflux associated changes in the esophagus, parietal cell hyperplasia, and nonspecific reactive gastropathy.   The pt reports that she has not experienced any reflux, and has taken her Nexium every day for 8 weeks, followed by a slow taper. She notes that her energy levels have been mostly stable, sometimes tired. The pt has continued to take daily PO Iron and B12 replacement.   The pt notes she is taking an ACE inhibitor.  The pt denies noticing any enlarged lymph nodes and denies any chest pains, abdominal pains, or lower belly pains.   Of note since the patient's last visit, pt has had an [place]of Bilateral Axilla completed on 09/22/18 with results revealing Ultrasound is performed, showing numerous enlarged LEFT axillary lymph nodes. Largest lymph node in the LEFT axilla has cortical thickening of 9 millimeters. Largest lymph node is 3.3 centimeters in length. Evaluation of the contralateral axilla for comparison demonstrate lymph nodes with cortical thickening. Largest lymph node  in the RIGHT axilla is 3.4 x 1.3 centimeters.  Lab results today (11/10/18) of CBC w/diff and CMP is as follows: all values are WNL except for HGB at 11.9, Potassium at 5.2, Glucose at 171, BUN at 29, Creatinine at 1.44 GFR at 37. 11/10/18 Iron and TIBC revealed a 22% Saturation ratio 11/10/18 LDH at 178 11/10/18 Vitamin B12 is pending  11/10/18 Ferritin is at 34   On review of systems, pt reports improved acid reflux,  stable energy levels overall, stable ankle swelling, staying active, and denies chest pain, noticing any new lumps or bumps, abdominal pains, pelvic pains, and any other symptoms.    MEDICAL HISTORY:  Past Medical History:  Diagnosis Date  . Anxiety   . Arthritis   . CLL (chronic lymphocytic leukemia) ([place])   . Depression    Wellbutrin  . Diabetes mellitus    Type II  . Endometrial ca ([place])    1998  . 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 GERD (gastroesophageal reflux disease)   . Heart murmur    patient states "cardiologist has not heard heart murmur for past several years"  . Heel spur   . Hyperlipidemia   . Hypertension   . Left leg swelling    "swelling in left leg from knee down when sitting for prolonged periods or being on feet for a long period of time"  . PONV (postoperative nausea and vomiting)    after hysterectomy  . Renal insufficiency     SURGICAL HISTORY: Past Surgical History:  Procedure Laterality Date  . ABDOMINAL HYSTERECTOMY  1998  . COLONOSCOPY    . EYE SURGERY Bilateral    cataracts  . EYE SURGERY Left    retina tear  . FEMUR IM NAIL  10/25/2012   Procedure: INTRAMEDULLARY (IM) NAIL FEMORAL;  Surgeon: Mauri Pole, MD;  Location: [facility];  Service: Orthopedics;  Laterality: Left;  . HIP SURGERY    . left knee arthroscopy  12/2010  . REFRACTIVE SURGERY    . TOTAL KNEE ARTHROPLASTY  12/20/2011   Procedure: TOTAL KNEE ARTHROPLASTY;  Surgeon: Gearlean Alf, MD;  Location: [facility];  Service: Orthopedics;  Laterality: Left;  Failed attempt at spinal   . TOTAL SHOULDER ARTHROPLASTY Right 08/19/2016   Procedure: RIGHT TOTAL SHOULDER ARTHROPLASTY;  Surgeon: Justice Britain, MD;  Location: [facility];  Service: Orthopedics;  Laterality: Right;    SOCIAL HISTORY: Social History   Socioeconomic History  . Marital status: Single    Spouse name: Not on file  . Number of children: Not on file  . Years of education: Not on  file  . Highest education level: Not on file  Occupational History  . Occupation: Therapist, sports at [employer]: [employer]  Social Needs  . Financial resource strain: Not on file  . Food insecurity:    Worry: Not on file    Inability: Not on file  . Transportation needs:    Medical: Not on file    Non-medical: Not on file  Tobacco Use  . Smoking status: Never Smoker  . Smokeless tobacco: Never Used  Substance and Sexual Activity  . Alcohol use: No  . Drug use: No  . Sexual activity: Not on file  Lifestyle  . Physical activity:    Days per week: Not on file    Minutes per session: Not on file  . Stress: Not on file  Relationships  . Social connections:    Talks on phone: Not on file    Gets together: Not on file    Attends religious service: Not on file    Active member of club or organization: Not on file    Attends meetings of clubs or organizations: Not on file    Relationship status: Not on file  . Intimate partner violence:    Fear of current or ex partner: Not on file    Emotionally abused: Not on file    Physically abused: Not on file    Forced sexual activity: Not on file  Other Topics Concern  . 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 Cancer Mother        breast cancer  . Cancer Father        plasma sarcoma  . Breast cancer Neg Hx     ALLERGIES:  is allergic to amlodipine.  MEDICATIONS:  Current Outpatient Medications  Medication Sig Dispense Refill  . ALPRAZolam (XANAX) 1 MG tablet TAKE 1/2-1 TABLET BY MOUTH 2 TIMES A DAY AS NEEDED 60 tablet 5  . aspirin 81 MG tablet Take 81 mg by mouth daily.    . benazepril-hydrochlorthiazide (LOTENSIN HCT) 20-12.5 MG tablet Take 1 tablet by mouth daily.  3  . Blood Glucose Monitoring Suppl (FREESTYLE FREEDOM LITE) w/Device KIT Use to check blood sugar twice a day, E11.65 1 each 1  . buPROPion  (WELLBUTRIN XL) 300 MG 24 hr tablet TAKE 1 TABLET BY MOUTH DAILY. 90 tablet 3  . Cholecalciferol (VITAMIN D3) 2000 units TABS Take 2,000 mcg by mouth daily.    . Cyanocobalamin (B-12) 1000 MCG SUBL Place 1 tablet under the tongue daily.    Marland Kitchen doxazosin (CARDURA) 2 MG tablet TAKE 1 TABLET BY MOUTH DAILY. 30 tablet 11  . Esomeprazole Magnesium (NEXIUM PO) Take 22.5 mg by mouth daily.    Marland Kitchen ezetimibe (ZETIA) 10 MG tablet Take 1 tablet (10 mg total) by mouth daily. 90 tablet 3  . gabapentin (NEURONTIN) 300 MG capsule Take 1 capsule (300 mg total) by mouth at bedtime. 90 capsule 3  . glipiZIDE (GLUCOTROL XL) 2.5 MG 24 hr tablet Take 2.5 MG  By mouth 1x a day before b'fast 90 tablet 3  . glipiZIDE (GLUCOTROL) 5 MG tablet Take 1 tablet (5 mg total) by mouth daily before supper. 90 tablet 3  . glucose blood (FREESTYLE LITE) test strip TEST BLOOD SUGAR TWICE DAILY 200 each 0  . iron polysaccharides (NIFEREX) 150 MG capsule Take 150 mg by mouth daily.    Elmore Guise Devices (LANCING DEVICE) MISC Use as advised - Freestyle 1 each 0  . Lancets (FREESTYLE) lancets Use to check blood sugar twice a day, E11.65 200 each 12  . metFORMIN (GLUCOPHAGE-XR) 500 MG 24 hr tablet TAKE 2 TABLETS BY MOUTH 2 TIMES DAILY WITH A MEAL. 360 tablet 0  . metoprolol succinate (TOPROL-XL) 25 MG 24 hr tablet TAKE 1 TABLET BY MOUTH DAILY. 90 tablet 2  . Multiple Vitamins-Minerals (HM MULTIVITAMIN ADULT GUMMY PO) Take 1 tablet by mouth daily.    . simvastatin (ZOCOR) 40 MG tablet Take 40 mg by mouth daily.     No current facility-administered medications for this visit.     REVIEW OF SYSTEMS:    A 10+ POINT  REVIEW OF SYSTEMS WAS OBTAINED including neurology, dermatology, psychiatry, cardiac, respiratory, lymph, extremities, GI, GU, Musculoskeletal, constitutional, breasts, reproductive, HEENT.  All pertinent positives are noted in the HPI.  All others are negative.   PHYSICAL EXAMINATION: ECOG PERFORMANCE STATUS: 1 - Symptomatic but  completely ambulatory  Vitals:   11/10/18 1148  BP: (!) 164/84  Pulse: 82  Resp: 18  Temp: 98.1 F (36.7 C)  SpO2: 94%   Filed Weights   11/10/18 1148  Weight: 190 lb 12.8 oz (86.5 kg)   .Body mass index is 36.05 kg/m.  GENERAL:alert, in no acute distress and comfortable SKIN: no acute rashes, no significant lesions EYES: conjunctiva are pink and non-injected, sclera anicteric OROPHARYNX: MMM, no exudates, no oropharyngeal erythema or ulceration NECK: supple, no JVD LYMPH: Borderline palpable lymph node in left axillary, no palpable lymphadenopathy in the cervical or inguinal regions LUNGS: clear to auscultation b/l with normal respiratory effort HEART: regular rate & rhythm ABDOMEN:  normoactive bowel sounds , non tender, not distended. No palpable hepatosplenomegaly.  Extremity: 1+ left pedal edema, no right pedal edema  PSYCH: alert & oriented x 3 with fluent speech NEURO: no focal motor/sensory deficits   LABORATORY DATA:  I have reviewed the data as listed  . CBC Latest Ref Rng & Units 11/10/2018 07/05/2018 05/10/2018  WBC 4.0 - 10.5 K/uL 9.1 8.1 7.4  Hemoglobin 12.0 - 15.0 g/dL 11.9(L) 12.2 11.4(L)  Hematocrit 36.0 - 46.0 % 38.7 37.3 35.8  Platelets 150 - 400 K/uL 182 165.0 151  hgb 10.7 ANC 3.2k .Marland Kitchen CBC    Component Value Date/Time   WBC 9.1 11/10/2018 1054   RBC 4.47 11/10/2018 1054   HGB 11.9 (L) 11/10/2018 1054   HGB 11.4 (L) 05/10/2018 1009   HCT 38.7 11/10/2018 1054   PLT 182 11/10/2018 1054   PLT 151 05/10/2018 1009   MCV 86.6 11/10/2018 1054   MCH 26.6 11/10/2018 1054   MCHC 30.7 11/10/2018 1054   RDW 13.2 11/10/2018 1054   LYMPHSABS 3.4 11/10/2018 1054   MONOABS 0.5 11/10/2018 1054   EOSABS 0.3 11/10/2018 1054   BASOSABS 0.1 11/10/2018 1054    CMP Latest Ref Rng & Units 11/10/2018 08/18/2018 07/06/2018  Glucose 70 - 99 mg/dL 171(H) 104(H) 121(H)  BUN 8 - 23 mg/dL 29(H) 30(H) 39(H)  Creatinine 0.44 - 1.00 mg/dL 1.44(H) 1.30(H) 1.86(H)  Sodium  135 - 145 mmol/L 140 140 139  Potassium 3.5 - 5.1 mmol/L 5.2(H) 4.7 4.8  Chloride 98 - 111 mmol/L 108 106 108  CO2 22 - 32 mmol/L 22 24 21   Calcium 8.9 - 10.3 mg/dL 9.6 9.3 9.3  Total Protein 6.5 - 8.1 g/dL 6.8 5.9(L) 6.3  Total Bilirubin 0.3 - 1.2 mg/dL 0.7 0.6 0.3  Alkaline Phos 38 - 126 U/L 112 - -  AST 15 - 41 U/L 18 13 14   ALT 0 - 44 U/L 16 12 15      12/15/17 FISH CLL Prognostic Panel:    RADIOGRAPHIC STUDIES: I have personally reviewed the radiological images as listed and agreed with the findings in the report. No results found.  ASSESSMENT & PLAN:  Brenda [name] is a 68 y.o. 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 Lab Results  Component Value Date   LDH  178 11/10/2018   05/01/18 CT C/A/P revealed Mildly progressive lymphadenopathy in the chest, abdomen, and pelvis, corresponding to the patient's known CLL, as above. Dominant left external iliac node measures 3.3 cm short axis, previously 2.8cm. Spleen is normal in size. Numerous bilateral pulmonary nodules measuring up to 9 mm, grossly unchanged from 2017. Prior bilateral pleural effusions have resolved.   2. Multiple pulmonary nodules   CT AP on 11/17/17 - with 1. Pelvic lymphadenopathy and innumerable pulmonary nodules in the lung bases, consistent with metastatic disease. 2. Cholelithiasis without CT evidence of acute cholecystitis. 3.  Aortic atherosclerosis  CT Chest on 11/20/17 - with multiple small lung nodules in LLL that appear stable from prior CT in 12/2015 and are considered benign and Upper lung nodules also appear benign. Also with several borderline enlarged lymph nodes concerning for a lymphoproliferative disorder   3. H/o endometrial cancer in  1998 -localized to uterus -treated with surgery, abdominal hysterectomy  -no evidence of recurrence  4. Microcytic anemia - Fe deficiency  Presented in hospital with Hgb of 6.6 Hgb holding steady s/p 2U PRBC - Fe infusioncompleted 1/19- stool hemoccult negative - suspect this may be due to CKD, but iron deficiency also worrisome for occult GIB  -Tolerated IV iron well  . Lab Results  Component Value Date   IRON 88 11/10/2018   TIBC 403 11/10/2018   IRONPCTSAT 22 11/10/2018   (Iron and TIBC)  Lab Results  Component Value Date   FERRITIN 34 11/10/2018    5. Marginal B12 levels in hospital  (Antiparietal celll and anti IF Ab negative) -given one B12 injection in hospital  PLAN:  -Discussed pt labwork today, 11/10/18; blood counts are stable and WBC are normal at 9.1k. Ferritin stable at 34. LDH at 178.  -Discussed the 09/22/18 [place] Bilateral Axilla which revealed Ultrasound is performed, showing numerous enlarged LEFT axillary lymph nodes. Largest lymph node in the LEFT axilla has cortical thickening of 9 millimeters. Largest lymph node is 3.3 centimeters in length. Evaluation of the contralateral axilla for comparison demonstrate lymph nodes with cortical thickening. Largest lymph node in the RIGHT axilla is 3.4 x 1.3 centimeters. -Will refer the pt for an US guided core lymph node biopsy for a tissue diagnosis which the pt prefers to complete in March  -asymptomatic Axillary lymphadenopathy not indicative of treatment at this time, no constitutional symptoms, no significant cytopenias  -Continue on 33m aspirin -Continue [id number] Vitamin B12  -Last colonoscopy was 08/31/18 and normal -We noted that her anemia is likely not connected with her CLL given her history of iron-deficiency anemia.   -Continue PO 15034mron polysaccharide every day -Will see the pt back in 6 months    US [place]ided left axillary LN biopsy in 2 months  RTC with Dr KalIrene Limbo 6 months with labs    All of the  patients questions were answered with apparent satisfaction. The patient knows to call the clinic with any problems, questions or concerns.  The total time spent in the appt was 35 minutes and more than 50% was on counseling and direct patient cares.   GauSullivan Lone MS AAHIVMS SCHSutter Roseville End[facility] CenterHCenter For Digest[facility] And Pain Managementmatology/Oncology Physician ConDiley Ridge Medical CenterOffice):       [phone] cell):  [phone]):           [phone]/08/2019 12:57 PM  I, SchBaldwin Jamaicam acting as a scribe for Dr. GauSullivan Lone .I have reviewed the above documentation for accuracy  and completeness, and I agree with the above. Brunetta Genera MD

## 2018-11-09 NOTE — Telephone Encounter (Signed)
Please send in refill. She was recently seen and no changes were made. Thanks

## 2018-11-10 ENCOUNTER — Telehealth: Payer: Self-pay

## 2018-11-10 ENCOUNTER — Inpatient Hospital Stay: Payer: Medicare Other | Attending: Hematology

## 2018-11-10 ENCOUNTER — Inpatient Hospital Stay (HOSPITAL_BASED_OUTPATIENT_CLINIC_OR_DEPARTMENT_OTHER): Payer: Medicare Other | Admitting: Hematology

## 2018-11-10 VITALS — BP 164/84 | HR 82 | Temp 98.1°F | Resp 18 | Ht 61.0 in | Wt 190.8 lb

## 2018-11-10 DIAGNOSIS — C9110 Chronic lymphocytic leukemia of B-cell type not having achieved remission: Secondary | ICD-10-CM | POA: Insufficient documentation

## 2018-11-10 DIAGNOSIS — D508 Other iron deficiency anemias: Secondary | ICD-10-CM | POA: Insufficient documentation

## 2018-11-10 DIAGNOSIS — Z79899 Other long term (current) drug therapy: Secondary | ICD-10-CM | POA: Diagnosis not present

## 2018-11-10 DIAGNOSIS — R590 Localized enlarged lymph nodes: Secondary | ICD-10-CM

## 2018-11-10 DIAGNOSIS — E611 Iron deficiency: Secondary | ICD-10-CM

## 2018-11-10 DIAGNOSIS — R918 Other nonspecific abnormal finding of lung field: Secondary | ICD-10-CM

## 2018-11-10 DIAGNOSIS — Z90710 Acquired absence of both cervix and uterus: Secondary | ICD-10-CM | POA: Insufficient documentation

## 2018-11-10 DIAGNOSIS — Z8542 Personal history of malignant neoplasm of other parts of uterus: Secondary | ICD-10-CM | POA: Diagnosis not present

## 2018-11-10 DIAGNOSIS — E538 Deficiency of other specified B group vitamins: Secondary | ICD-10-CM

## 2018-11-10 LAB — CBC WITH DIFFERENTIAL/PLATELET
ABS IMMATURE GRANULOCYTES: 0.05 10*3/uL (ref 0.00–0.07)
BASOS ABS: 0.1 10*3/uL (ref 0.0–0.1)
Basophils Relative: 1 %
Eosinophils Absolute: 0.3 10*3/uL (ref 0.0–0.5)
Eosinophils Relative: 3 %
HEMATOCRIT: 38.7 % (ref 36.0–46.0)
Hemoglobin: 11.9 g/dL — ABNORMAL LOW (ref 12.0–15.0)
Immature Granulocytes: 1 %
LYMPHS ABS: 3.4 10*3/uL (ref 0.7–4.0)
LYMPHS PCT: 37 %
MCH: 26.6 pg (ref 26.0–34.0)
MCHC: 30.7 g/dL (ref 30.0–36.0)
MCV: 86.6 fL (ref 80.0–100.0)
Monocytes Absolute: 0.5 10*3/uL (ref 0.1–1.0)
Monocytes Relative: 6 %
NEUTROS ABS: 4.8 10*3/uL (ref 1.7–7.7)
NRBC: 0 % (ref 0.0–0.2)
Neutrophils Relative %: 52 %
Platelets: 182 10*3/uL (ref 150–400)
RBC: 4.47 MIL/uL (ref 3.87–5.11)
RDW: 13.2 % (ref 11.5–15.5)
WBC: 9.1 10*3/uL (ref 4.0–10.5)

## 2018-11-10 LAB — CMP (CANCER CENTER ONLY)
ALK PHOS: 112 U/L (ref 38–126)
ALT: 16 U/L (ref 0–44)
AST: 18 U/L (ref 15–41)
Albumin: 4.2 g/dL (ref 3.5–5.0)
Anion gap: 10 (ref 5–15)
BUN: 29 mg/dL — ABNORMAL HIGH (ref 8–23)
CO2: 22 mmol/L (ref 22–32)
Calcium: 9.6 mg/dL (ref 8.9–10.3)
Chloride: 108 mmol/L (ref 98–111)
Creatinine: 1.44 mg/dL — ABNORMAL HIGH (ref 0.44–1.00)
GFR, Est AFR Am: 43 mL/min — ABNORMAL LOW (ref >60)
GFR, Estimated: 37 mL/min — ABNORMAL LOW (ref >60)
Glucose, Bld: 171 mg/dL — ABNORMAL HIGH (ref 70–99)
Potassium: 5.2 mmol/L — ABNORMAL HIGH (ref 3.5–5.1)
Sodium: 140 mmol/L (ref 135–145)
Total Bilirubin: 0.7 mg/dL (ref 0.3–1.2)
Total Protein: 6.8 g/dL (ref 6.5–8.1)

## 2018-11-10 LAB — LACTATE DEHYDROGENASE: LDH: 178 U/L (ref 98–192)

## 2018-11-10 LAB — IRON AND TIBC
Iron: 88 ug/dL (ref 41–142)
Saturation Ratios: 22 % (ref 21–57)
TIBC: 403 ug/dL (ref 236–444)
UIBC: 315 ug/dL (ref 120–384)

## 2018-11-10 LAB — VITAMIN B12: VITAMIN B 12: 493 pg/mL (ref 180–914)

## 2018-11-10 LAB — FERRITIN: Ferritin: 34 ng/mL (ref 11–307)

## 2018-11-10 NOTE — Telephone Encounter (Signed)
Printed avs and calender of upcoming appointment. Per 1/10 los

## 2018-11-13 MED FILL — METOPROLOL SUCCINATE ER 25: 25 | 90 days supply | Qty: 90 | Fill #1

## 2018-11-13 MED FILL — ALPRAZolam 1 MG TABS: 1 | 30 days supply | Qty: 60 | Fill #3

## 2018-12-08 MED FILL — DOXAZOSIN MESYLATE 2 MG TAB: 2 | 30 days supply | Qty: 30 | Fill #1

## 2018-12-12 ENCOUNTER — Other Ambulatory Visit: Payer: Self-pay | Admitting: Internal Medicine

## 2018-12-12 MED FILL — metFORMIN HCL ER 500 MG TB2: 500 | 90 days supply | Qty: 360 | Fill #0

## 2018-12-12 MED FILL — ALPRAZolam 1 MG TABS: 1 | 30 days supply | Qty: 60 | Fill #4

## 2018-12-25 MED FILL — glipiZIDE ER 2.5 MG TB24: 2.5 | 45 days supply | Qty: 90 | Fill #1 | Status: TO

## 2018-12-29 MED FILL — glipiZIDE 5 MG TABS: 5 | 90 days supply | Qty: 90 | Fill #1 | Status: TO

## 2018-12-29 MED FILL — GABAPENTIN 300 MG CAPSULE: 300 | 90 days supply | Qty: 90 | Fill #2

## 2019-01-03 ENCOUNTER — Other Ambulatory Visit (HOSPITAL_COMMUNITY)
Admission: RE | Admit: 2019-01-03 | Discharge: 2019-01-03 | Disposition: A | Discharge status: Home / Self Care | Payer: Medicare Other | Source: Ambulatory Visit | Attending: Hematology | Admitting: Hematology

## 2019-01-03 ENCOUNTER — Ambulatory Visit
Admission: RE | Admit: 2019-01-03 | Discharge: 2019-01-03 | Disposition: A | Discharge status: Home / Self Care | Payer: Medicare Other | Source: Ambulatory Visit | Attending: Hematology | Admitting: Hematology

## 2019-01-03 DIAGNOSIS — C9110 Chronic lymphocytic leukemia of B-cell type not having achieved remission: Secondary | ICD-10-CM

## 2019-01-03 DIAGNOSIS — R590 Localized enlarged lymph nodes: Secondary | ICD-10-CM | POA: Diagnosis not present

## 2019-01-08 MED FILL — DOXAZOSIN MESYLATE 2 MG TAB: 2 | 30 days supply | Qty: 30 | Fill #2 | Status: TO

## 2019-01-10 ENCOUNTER — Other Ambulatory Visit: Payer: Self-pay | Admitting: Cardiovascular Disease

## 2019-01-10 MED FILL — BENAZEPRIL-HYDROCHLOROTHIAZ: 20-12.5 | 90 days supply | Qty: 90 | Fill #3

## 2019-01-10 MED FILL — SIMVASTATIN 40 MG TABLET: 40 | 90 days supply | Qty: 90 | Fill #0 | Status: TO

## 2019-01-15 MED FILL — ALPRAZolam 1 MG TABS: 1 | 30 days supply | Qty: 60 | Fill #5

## 2019-01-22 DIAGNOSIS — M65341 Trigger finger, right ring finger: Secondary | ICD-10-CM | POA: Diagnosis not present

## 2019-01-22 DIAGNOSIS — M79641 Pain in right hand: Secondary | ICD-10-CM | POA: Diagnosis not present

## 2019-01-22 DIAGNOSIS — R2231 Localized swelling, mass and lump, right upper limb: Secondary | ICD-10-CM | POA: Diagnosis not present

## 2019-01-22 DIAGNOSIS — M65331 Trigger finger, right middle finger: Secondary | ICD-10-CM | POA: Diagnosis not present

## 2019-01-22 DIAGNOSIS — B079 Viral wart, unspecified: Secondary | ICD-10-CM | POA: Diagnosis not present

## 2019-01-30 ENCOUNTER — Ambulatory Visit: Payer: Medicare Other | Admitting: Internal Medicine

## 2019-01-30 MED FILL — buPROPion HCL ER (XL) 300 M: 300 | 90 days supply | Qty: 90 | Fill #0

## 2019-01-30 MED FILL — EZETIMIBE 10 MG TABS: 10 | 90 days supply | Qty: 90 | Fill #0 | Status: TO

## 2019-02-02 MED FILL — SULFAMETHOXAZOLE-TMP DS TAB: 800-160 | 8 days supply | Qty: 16 | Fill #0

## 2019-02-05 MED FILL — HYDROCODON-APAP 5-325: 5-325 | 6 days supply | Qty: 40 | Fill #0

## 2019-02-05 MED FILL — DOXAZOSIN MESYLATE 2 MG TAB: 2 | 30 days supply | Qty: 30 | Fill #0 | Status: TO

## 2019-02-07 ENCOUNTER — Other Ambulatory Visit: Payer: Self-pay | Admitting: Orthopedic Surgery

## 2019-02-07 DIAGNOSIS — M79641 Pain in right hand: Secondary | ICD-10-CM | POA: Diagnosis not present

## 2019-02-07 DIAGNOSIS — D0461 Carcinoma in situ of skin of right upper limb, including shoulder: Secondary | ICD-10-CM | POA: Diagnosis not present

## 2019-02-07 DIAGNOSIS — M65331 Trigger finger, right middle finger: Secondary | ICD-10-CM | POA: Diagnosis not present

## 2019-02-07 DIAGNOSIS — R2231 Localized swelling, mass and lump, right upper limb: Secondary | ICD-10-CM | POA: Diagnosis not present

## 2019-02-07 DIAGNOSIS — C44622 Squamous cell carcinoma of skin of right upper limb, including shoulder: Secondary | ICD-10-CM | POA: Diagnosis not present

## 2019-02-07 DIAGNOSIS — M65341 Trigger finger, right ring finger: Secondary | ICD-10-CM | POA: Diagnosis not present

## 2019-02-07 HISTORY — PX: EXCISION MASS UPPER EXTREMETIES: SHX6704

## 2019-02-07 IMAGING — CT CT CHEST W/ CM
2 of 3 series · 14 of 36 positions shown, 17 images · IV contrast (Omni 300)
Comparison: Coronary calcium score CT scan 01/13/2016. CT the
abdomen and pelvis 11/17/2017.

CLINICAL DATA: 66-year-old female with history of pulmonary nodule.
Followup study.

EXAM:
CT CHEST WITH CONTRAST
TECHNIQUE: Multidetector CT imaging of the chest was performed during
intravenous contrast administration.
CONTRAST:  50mL MYZMLW-KWW IOPAMIDOL (MYZMLW-KWW) INJECTION 61%

[Series 3: chest with 2mm st · axial · 0.68mm/px · z∈[+1165,+1419]mm · 11 of 149 slices shown, 14 images]
[im 11/149  mediastinal]
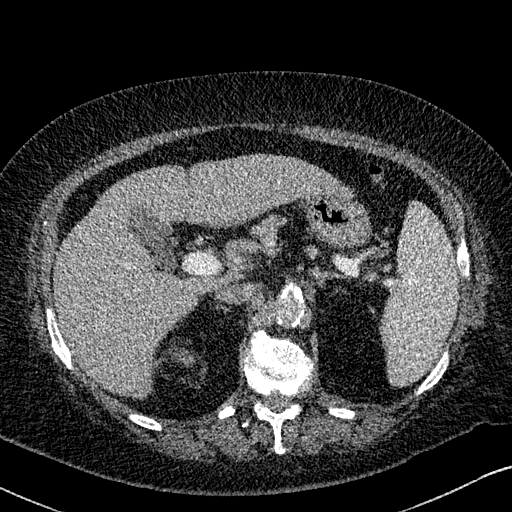
[im 11/149  lung]
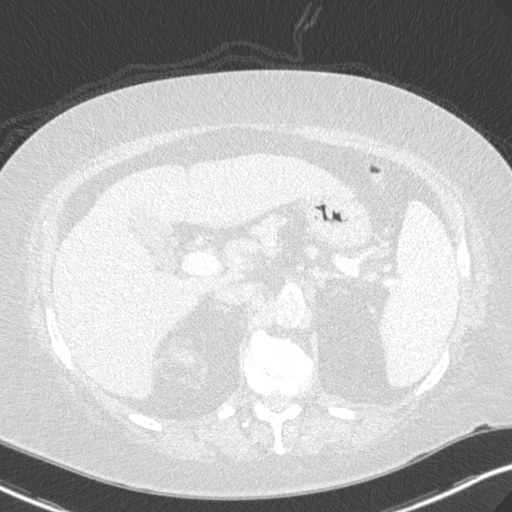
[im 22/149  lung]
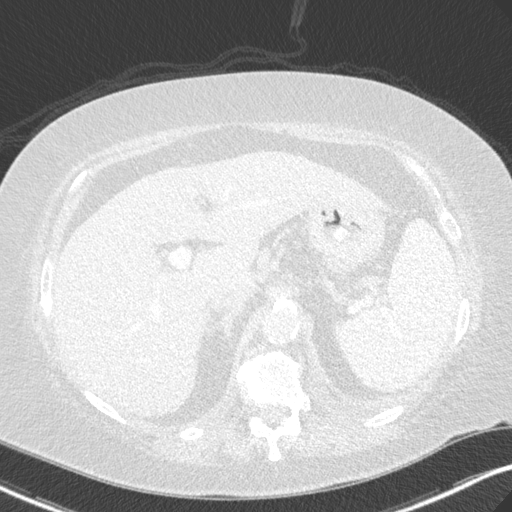
[im 33/149  lung]
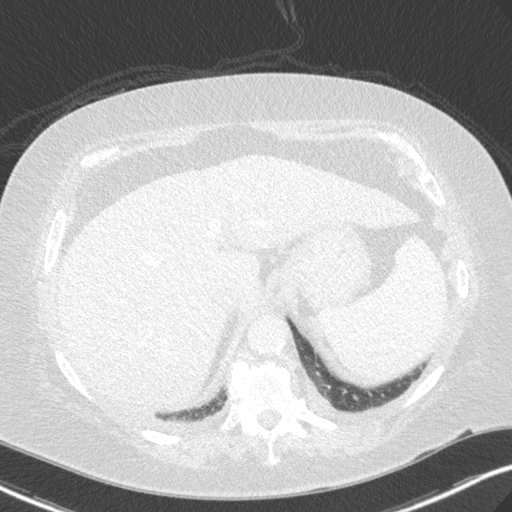
[im 50/149  lung]
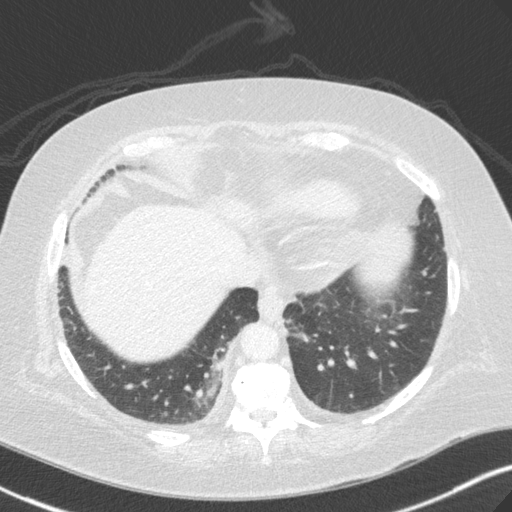
[im 61/149  mediastinal]
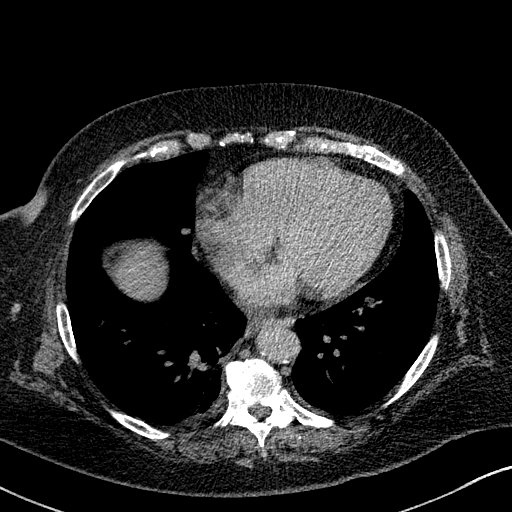
[im 61/149  lung]
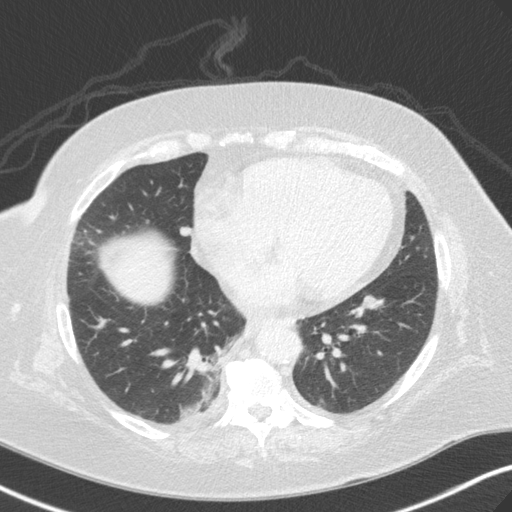
[im 77/149  lung]
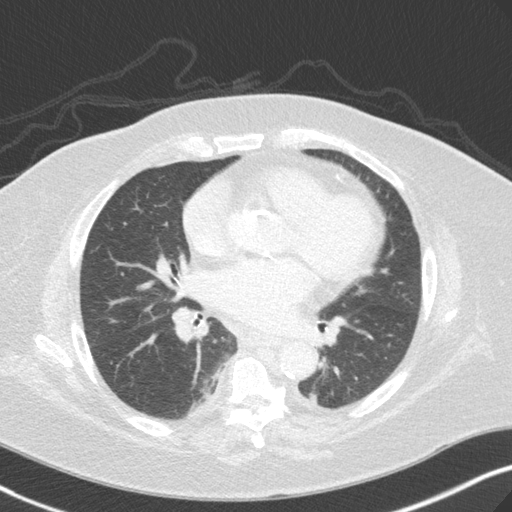
[im 88/149  lung]
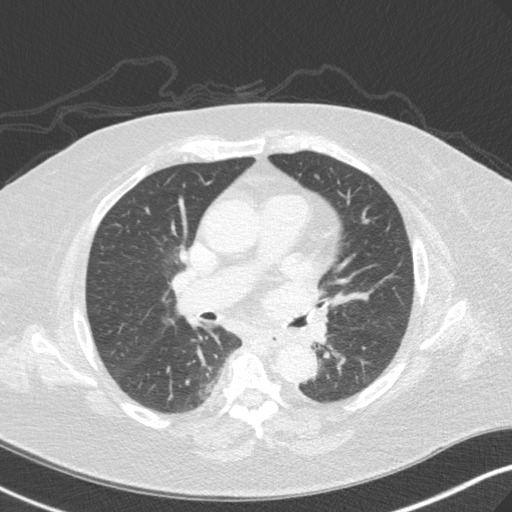
[im 99/149  lung]
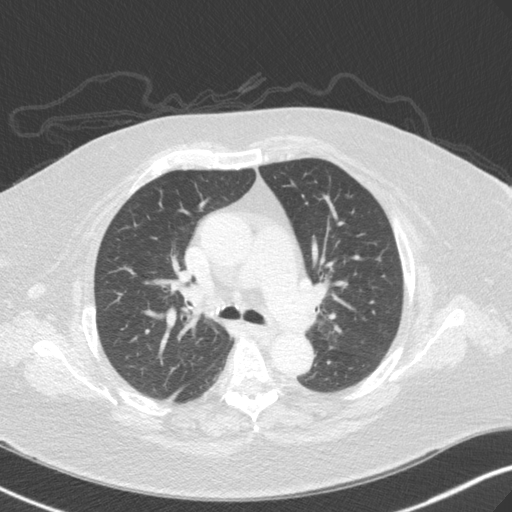
[im 116/149  mediastinal]
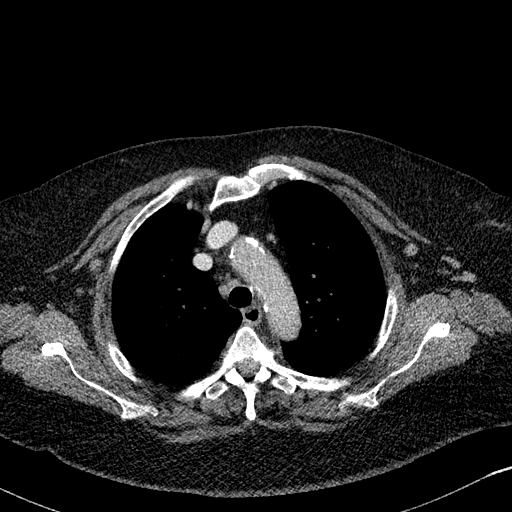
[im 116/149  lung]
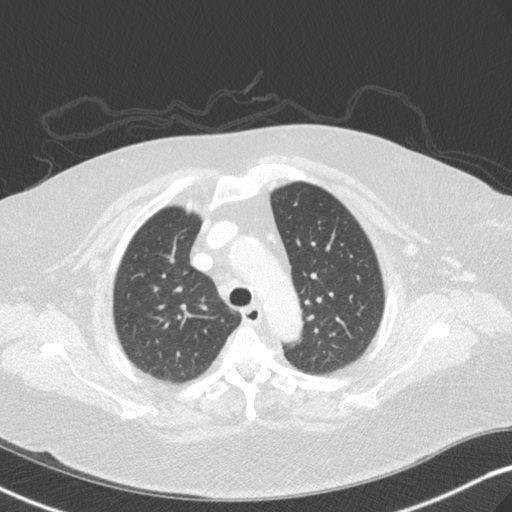
[im 127/149  lung]
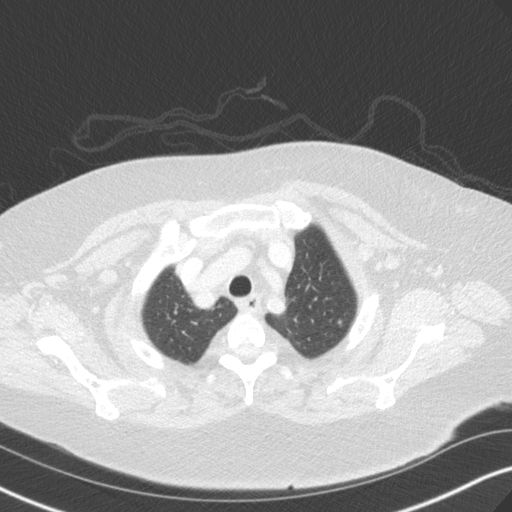
[im 138/149  lung]
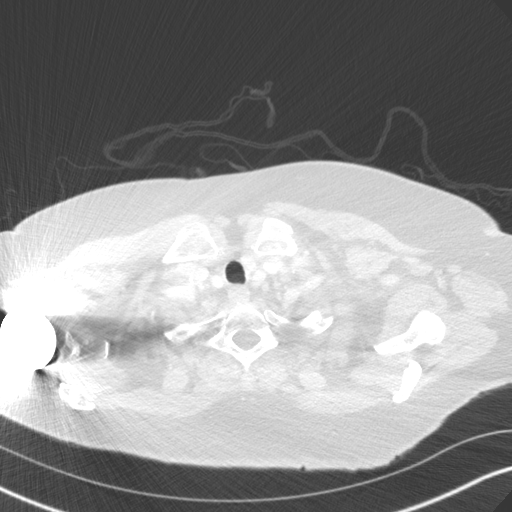

[Series 5: chest with 3mm st cor · coronal · 0.59mm/px · 3 of 101 slices shown]
[im 21/101  lung]
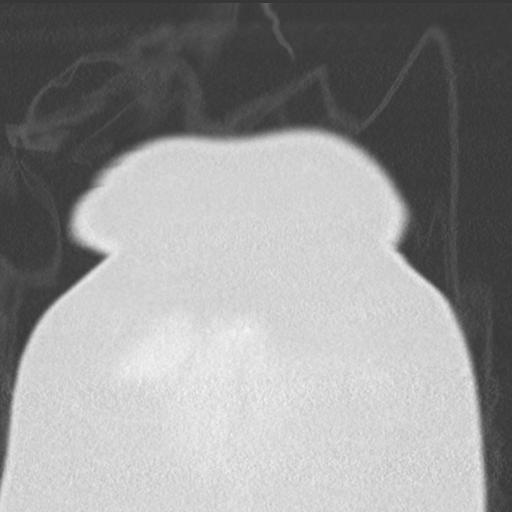
[im 41/101  lung]
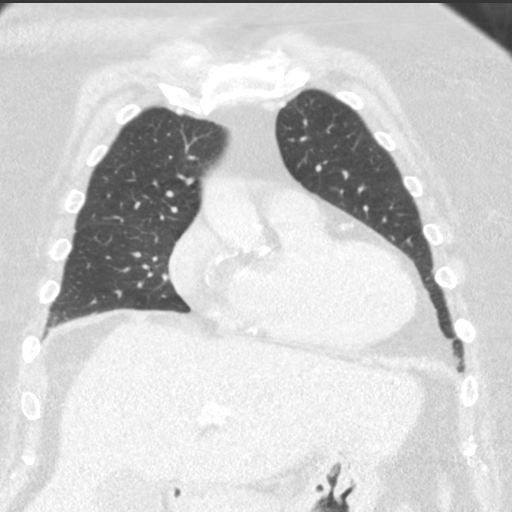
[im 61/101  lung]
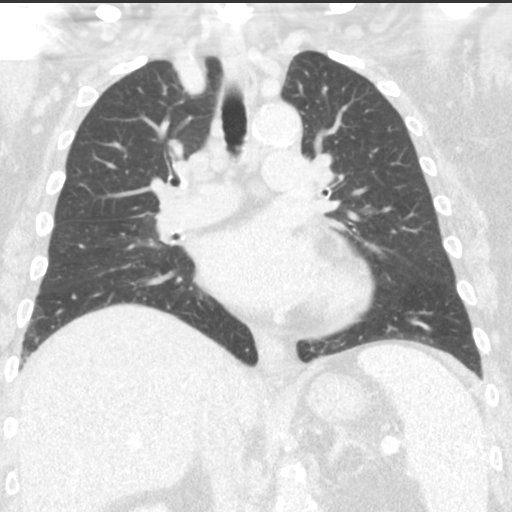

[14 of 36 positions shown; findings below may reference images not displayed]

FINDINGS: Cardiovascular: Heart size is mildly enlarged. There is no
significant pericardial fluid, thickening or pericardial
calcification. There is aortic atherosclerosis, as well as
atherosclerosis of the great vessels of the mediastinum and the
coronary arteries, including calcified atherosclerotic plaque in the
left main, left anterior descending, left circumflex and right
coronary arteries.

Mediastinum/Nodes: Multiple borderline enlarged mediastinal lymph
nodes measuring up to 11 mm in short axis in the high right
paratracheal nodal station. Esophagus is unremarkable in appearance.
Multiple prominent borderline enlarged axillary lymph nodes
bilaterally measuring up to 9 mm in short axis on the left. Mildly
enlarged right subpectoral lymph node measuring 1.4 cm in short
axis.

Lungs/Pleura: Again noted are numerous small pulmonary nodules
scattered throughout the lungs bilaterally. When compared to prior
cardiac CT 01/13/2016, all previously noted pulmonary nodules are
stable in size and number. The largest of these nodules is in the
left lower lobe (axial image 89 of series 4) measuring 9 x 8 mm. In
the upper lungs which were not visualized on the prior chest CT
there also several small pulmonary nodules which measure 3 mm or
less in size. No larger more suspicious appearing pulmonary nodules
or masses. No acute consolidative airspace disease. Trace right
pleural effusion lying dependently. No left pleural effusion. Mild
scarring and atelectasis noted in the medial aspect of the right
lower lobe.

Upper Abdomen: Calcified gallstones lying dependently in the
gallbladder measuring up to 1.5 cm in diameter. Aortic
atherosclerosis.

Musculoskeletal: There are no aggressive appearing lytic or blastic
lesions noted in the visualized portions of the skeleton.
IMPRESSION: 1. While there are multiple small pulmonary nodules scattered
throughout the lungs bilaterally measuring up to 8 x 9 mm in the
left lower lobe, these appear unchanged when compared to prior
cardiac CT 01/13/2016, and are considered benign. In the upper lungs
(which were not imaged on the prior examination, there are several
small 1-3 mm pulmonary nodules which are also strongly favored to be
benign. No follow-up needed if patient is low-risk (and has no known
or suspected primary neoplasm). Non-contrast chest CT can be
considered in 12 months if patient is high-risk. This recommendation
follows the consensus statement: Guidelines for Management of
Incidental Pulmonary Nodules Detected on CT Images: From the
2. Several borderline enlarged and mildly enlarged mediastinal and
bilateral axillary/subpectoral lymph nodes. Given the
lymphadenopathy in the pelvis, clinical correlation for signs and
symptoms of lymphoproliferative disorder is suggested.
3. Aortic atherosclerosis, in addition to left main and 3 vessel
coronary artery disease. Please note that although the presence of
coronary artery calcium documents the presence of coronary artery
disease, the severity of this disease and any potential stenosis
cannot be assessed on this non-gated CT examination. Assessment for
potential risk factor modification, dietary therapy or pharmacologic
therapy may be warranted, if clinically indicated.
4. Trace right pleural effusion lying dependently.

Aortic Atherosclerosis ([id number]-KGC.C).

## 2019-02-10 MED FILL — METOPROLOL SUCCINATE ER 25: 25 | 90 days supply | Qty: 90 | Fill #0 | Status: TO

## 2019-02-14 ENCOUNTER — Other Ambulatory Visit: Payer: Self-pay

## 2019-02-14 ENCOUNTER — Encounter: Payer: Self-pay | Admitting: Family Medicine

## 2019-02-14 MED ORDER — BUPROPION HCL ER (XL) 300 MG PO TB24: 300.0000 mg | ORAL_TABLET | Freq: Every day | ORAL | 2 refills | Status: DC

## 2019-02-14 MED ORDER — GABAPENTIN 300 MG PO CAPS: 300.0000 mg | ORAL_CAPSULE | Freq: Every day | ORAL | 2 refills | Status: DC

## 2019-02-19 DIAGNOSIS — Z86007 Personal history of in-situ neoplasm of skin: Secondary | ICD-10-CM

## 2019-02-19 DIAGNOSIS — Z5189 Encounter for other specified aftercare: Secondary | ICD-10-CM | POA: Diagnosis not present

## 2019-02-19 DIAGNOSIS — R2231 Localized swelling, mass and lump, right upper limb: Secondary | ICD-10-CM | POA: Diagnosis not present

## 2019-02-19 DIAGNOSIS — M79641 Pain in right hand: Secondary | ICD-10-CM | POA: Diagnosis not present

## 2019-02-19 HISTORY — DX: Personal history of in-situ neoplasm of skin: Z86.007

## 2019-02-26 ENCOUNTER — Encounter: Payer: Self-pay | Admitting: Internal Medicine

## 2019-02-26 ENCOUNTER — Ambulatory Visit (INDEPENDENT_AMBULATORY_CARE_PROVIDER_SITE_OTHER): Payer: Medicare Other | Admitting: Internal Medicine

## 2019-02-26 ENCOUNTER — Other Ambulatory Visit: Payer: Self-pay

## 2019-02-26 DIAGNOSIS — Z6835 Body mass index (BMI) 35.0-35.9, adult: Secondary | ICD-10-CM | POA: Diagnosis not present

## 2019-02-26 DIAGNOSIS — E782 Mixed hyperlipidemia: Secondary | ICD-10-CM | POA: Diagnosis not present

## 2019-02-26 DIAGNOSIS — E1165 Type 2 diabetes mellitus with hyperglycemia: Secondary | ICD-10-CM

## 2019-02-26 NOTE — Progress Notes (Signed)
Patient ID: Brenda [name], female   DOB: 07-Jul-1951, 68 y.o.   MRN: [id number]  Patient location: Home My location: Office  Referring Provider: Lucille Passy, MD  I connected with the patient on 02/26/19 at  9:05 AM EDT by telephone and verified that I am speaking with the correct person.   I discussed the limitations of evaluation and management by telephone and the availability of in person appointments. The patient expressed understanding and agreed to proceed.   Details of the encounter are shown below.  HPI: Brenda [name] is a 68 y.o.-year-old female, presenting presenting for follow-up for DM2, dx in 2007, non-insulin-dependent, uncontrolled, with complications (CKD). Last visit 5 months ago.  Last hemoglobin A1c was: Lab Results  Component Value Date   HGBA1C 6.6 (A) 09/27/2018   HGBA1C 6.2 (A) 05/24/2018   HGBA1C 5.7 01/20/2018   Pt is on a regimen of: - Metformin ER 1000 mg 2x a day - Glipizide ER 2.5 mg before breakfast  - Glipizide IR before (2.5-)5 mg dinner She was on Glipizide in 11/2015 after a steroid inj. At last visit, we stopped Glipizide 5 mg 2x a day, before meals, because of lows. We stopped Victoza due to nausea.  Pt checks her sugars 2x a day: - am: 83-139, 144, 162 >> 94-168, 190 >> 98-150, 278 (ave 120-130s) - 2h after b'fast: 156 >> n/c >> 148 >> n/c - before lunch: 69-87 >> 74-119 >> n/c - 2h after lunch: 118, 123 >> 92, 99 >> n/c - before dinner: 66, 71-127, 140 >> 90-138, 141 >> 110-130 - 2h after dinner:n/c >> 138, 148 >> n/c - bedtime: 74, 80-170 >> 91-143 >> n/c - nighttime: n/c Lowest sugar was 37 x1 >> ... >> 94 >> 98; she has hypoglycemia awareness in the 70s. Highest sugar was 190 >> 278 (steroids).  Glucometer: Freestyle Lite  Pt's meals are: - Breakfast: English muffin + preserve or cereal or yoghurt - snack: fruit or fruit + yoghurt - Lunch: 1/2 sandwich + chips + salsa + fruit/sugar free - Dinner: soup or chicken/steak +  veggies, occasional starch or Lean cuisine or light salad No soft drinks.  -+ CKD, last BUN/creatinine:  Lab Results  Component Value Date   BUN 29 (H) 11/10/2018   CREATININE 1.44 (H) 11/10/2018  On benazepril. -+ HL; last set of lipids: Lab Results  Component Value Date   CHOL 168 04/12/2018   HDL 34 (L) 04/12/2018   LDLCALC 85 04/12/2018   LDLDIRECT 88.0 03/07/2017   TRIG 247 (H) 04/12/2018   CHOLHDL 4.9 04/12/2018  On Simvastatin, Zetia. - last eye exam 03/2018: No DR; she had cataract surgery in 2016.  She has a history of retinal tear. -She denies numbness and tingling in her feet.  On Neurontin for leg cramps at night.  She also has HTN, GERD, depression.  She was admitted 11/2017 for dehydration, anemia, and acute renal failure >> diagnosed with CLL.  ROS: Constitutional: no weight gain/+ weight loss (2 lbs), no fatigue, no subjective hyperthermia, no subjective hypothermia Eyes: no blurry vision, no xerophthalmia ENT: no sore throat, no nodules palpated in neck, no dysphagia, no odynophagia, no hoarseness Cardiovascular: no CP/no SOB/no palpitations/+ leg swelling from Amlodipine - now off Respiratory: no cough/no SOB/no wheezing Gastrointestinal: no N/no V/no D/no C/no acid reflux Musculoskeletal: no muscle aches/no joint aches Skin: no rashes, no hair loss Neurological: no tremors/no numbness/no tingling/no dizziness  I reviewed pt's medications, allergies, PMH, social hx, family  hx, and changes were documented in the history of present illness. Otherwise, unchanged from my initial visit note.  Past Medical History:  Diagnosis Date  . Anxiety   . Arthritis   . CLL (chronic lymphocytic leukemia) ([place])   . Depression    Wellbutrin  . Diabetes mellitus    Type II  . Endometrial ca ([place])    1998  . 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 GERD (gastroesophageal reflux disease)   . Heart murmur    patient states  "cardiologist has not heard heart murmur for past several years"  . Heel spur   . 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 Hyperlipidemia   . Hypertension   . Left leg swelling    "swelling in left leg from knee down when sitting for prolonged periods or being on feet for a long period of time"  . PONV (postoperative nausea and vomiting)    after hysterectomy  . Renal insufficiency    Past Surgical History:  Procedure Laterality Date  . ABDOMINAL HYSTERECTOMY  1998  . COLONOSCOPY    . EXCISION MASS UPPER EXTREMETIES Right 02/07/2019   [facility] - right hand dorsal mass excision - Path-squamous cell carcinoma in situ  . EYE SURGERY Bilateral    cataracts  . EYE SURGERY Left    retina tear  . FEMUR IM NAIL  10/25/2012   Procedure: INTRAMEDULLARY (IM) NAIL FEMORAL;  Surgeon: Mauri Pole, MD;  [facility];  Service: Orthopedics;  Laterality: Left;  . HIP SURGERY    . left knee arthroscopy  12/2010  . REFRACTIVE SURGERY    . TOTAL KNEE ARTHROPLASTY  12/20/2011   Procedure: TOTAL KNEE ARTHROPLASTY;  Surgeon: Gearlean Alf, MD;  [facility];  Service: Orthopedics;  Laterality: Left;  Failed attempt at spinal   . TOTAL SHOULDER ARTHROPLASTY Right 08/19/2016   Procedure: RIGHT TOTAL SHOULDER ARTHROPLASTY;  Surgeon: Justice Britain, MD;  Location: [place];  Service: Orthopedics;  Laterality: Right;   Social History   Social History  . Marital Status: Single    Spouse Name: N/A  . Number of Children: 0   Occupational History  . 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 Smoking status: Never Smoker   . Smokeless tobacco: Never Used  . Alcohol Use: No  . 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 ALPRAZolam (XANAX) 1 MG tablet TAKE 1/2-1 TABLET BY MOUTH 2  TIMES A DAY AS NEEDED 60 tablet 5  . aspirin 81 MG tablet Take 81 mg by mouth daily.    . benazepril-hydrochlorthiazide (LOTENSIN HCT) 20-12.5 MG tablet Take 1 tablet by mouth daily.  3  . Blood Glucose Monitoring Suppl (FREESTYLE FREEDOM LITE) w/Device KIT Use to check blood sugar twice a day, E11.65 1 each 1  . buPROPion (WELLBUTRIN XL) 300 MG 24 hr tablet Take 1 tablet (300 mg total) by mouth daily. 90 tablet 2  . Cholecalciferol (VITAMIN D3) 2000 units TABS Take 2,000 mcg by mouth daily.    . Cyanocobalamin (B-12) 1000 MCG SUBL Place 1 tablet under the tongue daily.    Marland Kitchen doxazosin (CARDURA) 2 MG tablet TAKE 1 TABLET BY  MOUTH DAILY. 30 tablet 11  . Esomeprazole Magnesium (NEXIUM PO) Take 22.5 mg by mouth daily.    Marland Kitchen ezetimibe (ZETIA) 10 MG tablet Take 1 tablet (10 mg total) by mouth daily. 90 tablet 3  . gabapentin (NEURONTIN) 300 MG capsule Take 1 capsule (300 mg total) by mouth at bedtime. 90 capsule 2  . glipiZIDE (GLUCOTROL XL) 2.5 MG 24 hr tablet Take 2.5 MG  By mouth 1x a day before b'fast 90 tablet 3  . glipiZIDE (GLUCOTROL) 5 MG tablet Take 1 tablet (5 mg total) by mouth daily before supper. 90 tablet 3  . glucose blood (FREESTYLE LITE) test strip TEST BLOOD SUGAR TWICE DAILY 200 each 0  . iron polysaccharides (NIFEREX) 150 MG capsule Take 150 mg by mouth daily.    Elmore Guise Devices (LANCING DEVICE) MISC Use as advised - Freestyle 1 each 0  . Lancets (FREESTYLE) lancets Use to check blood sugar twice a day, E11.65 200 each 12  . metFORMIN (GLUCOPHAGE-XR) 500 MG 24 hr tablet TAKE 2 TABLETS BY MOUTH TWICE A DAY WITH A MEAL. 360 tablet 0  . metoprolol succinate (TOPROL-XL) 25 MG 24 hr tablet TAKE 1 TABLET BY MOUTH DAILY. 90 tablet 2  . Multiple Vitamins-Minerals (HM MULTIVITAMIN ADULT GUMMY PO) Take 1 tablet by mouth daily.    . simvastatin (ZOCOR) 40 MG tablet TAKE 1 TABLET BY MOUTH ONCE DAILY AT BEDTIME 90 tablet 3   No current facility-administered medications on file prior to visit.     Allergies  Allergen Reactions  . Amlodipine     Swelling     Family History  Problem Relation Age of Onset  . Cancer Mother        breast cancer  . Cancer Father        plasma sarcoma  . Breast cancer Neg Hx    PE: There were no vitals taken for this visit. There is no height or weight on file to calculate BMI. Wt Readings from Last 3 Encounters:  11/10/18 190 lb 12.8 oz (86.5 kg)  10/11/18 196 lb (88.9 kg)  09/27/18 187 lb (84.8 kg)   Constitutional:  in NAD  The physical exam was not performed (phone visit).  ASSESSMENT: 1. DM2, non-insulin-dependent, uncontrolled, with complications and hyperglycemia - mild CKD  2. Obesity  3. HL  PLAN:  1. Patient with longstanding, previously uncontrolled diabetes, with much improved in the last 1.5 years, initially on Victoza, but now off the medication due to nausea.  At last visit, we reduced her extended release glipizide before breakfast and added an immediate release glipizide before dinner as sugars were higher in the morning.  At last visit, HbA1c was higher than before, at 6.6%, but still at goal. - at this visit, sugars are better than at last visit with only 1x hyperglycemic spike in the 200s after a steroid injection. -Sugars in the morning are usually at goal with occasional sugars slightly higher than goal depending on what she eats the night before.  She tells me that she is having dinner late as she is usually waking up late also.  I do not feel that we need to adjust her regimen for now, but I did suggest that she uses between 2.5 and 5 mg of glipizide before dinner depending on the size of her meals, as now, she is only using the 5 mg dose. - I suggested to:  Patient Instructions  Please continue: - Metformin ER 1000 mg 2x a day -  Glipizide ER 2.5 mg before breakfast - Glipizide 2.5-5 mg before dinner  Please return in 4 months with your sugar log.   - We will the HbA1c check at next visit - continue checking  sugars at different times of the day - check 1x a day, rotating checks - advised for yearly eye exams >> she is UTD - Return to clinic in 4 mo with sugar log      2. Obesity - lost 2 lbs since last - intentional, by improving diet  3.  Hyperlipidemia - Reviewed latest lipid panel from 04/2018: LDL at goal, triglycerides high, HDL low Lab Results  Component Value Date   CHOL 168 04/12/2018   HDL 34 (L) 04/12/2018   LDLCALC 85 04/12/2018   LDLDIRECT 88.0 03/07/2017   TRIG 247 (H) 04/12/2018   CHOLHDL 4.9 04/12/2018  - Continues Zetia + Simvastatin (added back since last OV) without side effects  - time spent with the patient: 13 min, of which >50% was spent in obtaining information about her symptoms, reviewing her previous labs, evaluations, and treatments, counseling her about her conditions (please see the discussed topics above), and developing a plan to further investigate and treat it.  Philemon Kingdom, MD PhD [facility] Endocrinology

## 2019-02-26 NOTE — Patient Instructions (Signed)
Please continue: - Metformin ER 1000 mg 2x a day - Glipizide ER 2.5 mg before breakfast - Glipizide 2.5-5 mg before dinner  Please return in 4 months with your sugar log.

## 2019-03-05 ENCOUNTER — Other Ambulatory Visit: Payer: Self-pay | Admitting: Orthopedic Surgery

## 2019-03-05 DIAGNOSIS — R2231 Localized swelling, mass and lump, right upper limb: Secondary | ICD-10-CM | POA: Diagnosis not present

## 2019-03-05 DIAGNOSIS — D0461 Carcinoma in situ of skin of right upper limb, including shoulder: Secondary | ICD-10-CM | POA: Diagnosis not present

## 2019-03-10 ENCOUNTER — Other Ambulatory Visit: Payer: Self-pay | Admitting: Cardiovascular Disease

## 2019-03-12 ENCOUNTER — Telehealth: Payer: Self-pay | Admitting: Internal Medicine

## 2019-03-12 ENCOUNTER — Encounter: Payer: Self-pay | Admitting: Internal Medicine

## 2019-03-12 DIAGNOSIS — Z961 Presence of intraocular lens: Secondary | ICD-10-CM | POA: Diagnosis not present

## 2019-03-12 DIAGNOSIS — E119 Type 2 diabetes mellitus without complications: Secondary | ICD-10-CM | POA: Diagnosis not present

## 2019-03-12 LAB — HM DIABETES EYE EXAM

## 2019-03-12 MED ORDER — GLIPIZIDE ER 2.5 MG PO TB24: ORAL_TABLET | ORAL | 3 refills | Status: DC

## 2019-03-12 MED ORDER — GLIPIZIDE 5 MG PO TABS: 5.0000 mg | ORAL_TABLET | Freq: Every day | ORAL | 3 refills | Status: DC

## 2019-03-12 MED ORDER — METFORMIN HCL ER 500 MG PO TB24: ORAL_TABLET | ORAL | 0 refills | Status: DC

## 2019-03-12 NOTE — Telephone Encounter (Signed)
MEDICATION: Metformin mg , glipizide 2.5 & 5 mg  PHARMACY:  [facility]  IS THIS A 90 DAY SUPPLY :   IS PATIENT OUT OF MEDICATION:   IF NOT; HOW MUCH IS LEFT: 1 week  LAST APPOINTMENT DATE: @4 /27/2020  NEXT APPOINTMENT DATE:@8 /31/2020  DO WE HAVE YOUR PERMISSION TO LEAVE A DETAILED MESSAGE:  OTHER COMMENTS:  Patient states [facility] can not transfer RX  **Let patient know to contact pharmacy at the end of the day to make sure medication is ready. **  ** Please notify patient to allow 48-72 hours to process**  **Encourage patient to contact the pharmacy for refills or they can request refills through [facility]**

## 2019-03-12 NOTE — Telephone Encounter (Signed)
Refills sent to [facility].

## 2019-03-19 DIAGNOSIS — Z4789 Encounter for other orthopedic aftercare: Secondary | ICD-10-CM | POA: Diagnosis not present

## 2019-03-19 DIAGNOSIS — R2231 Localized swelling, mass and lump, right upper limb: Secondary | ICD-10-CM | POA: Diagnosis not present

## 2019-03-21 ENCOUNTER — Encounter: Payer: Medicare Other | Admitting: Family Medicine

## 2019-04-04 ENCOUNTER — Other Ambulatory Visit: Payer: Self-pay

## 2019-04-04 ENCOUNTER — Encounter: Payer: Self-pay | Admitting: Family Medicine

## 2019-04-04 ENCOUNTER — Other Ambulatory Visit: Payer: Self-pay | Admitting: Cardiovascular Disease

## 2019-04-04 ENCOUNTER — Telehealth: Payer: Self-pay | Admitting: Cardiovascular Disease

## 2019-04-04 MED ORDER — BENAZEPRIL-HYDROCHLOROTHIAZIDE 20-12.5 MG PO TABS: 1.0000 | ORAL_TABLET | Freq: Every day | ORAL | 3 refills | Status: DC

## 2019-04-04 MED ORDER — SIMVASTATIN 40 MG PO TABS: 40.0000 mg | ORAL_TABLET | Freq: Every day | ORAL | 3 refills | Status: DC

## 2019-04-04 MED ORDER — SIMVASTATIN 40 MG PO TABS: 40.0000 mg | ORAL_TABLET | Freq: Every day | ORAL | 0 refills | Status: DC

## 2019-04-04 NOTE — Telephone Encounter (Signed)
Patient calling Would like to check the status of having her lipid panel - it was cancelled around the beginning of COVID19  Please call to discuss

## 2019-04-04 NOTE — Telephone Encounter (Signed)
Pt advised lab orders are still active in the system and [facility] without an appt fasting to have them drawn.

## 2019-04-04 NOTE — Telephone Encounter (Signed)
°*  STAT* If patient is at the pharmacy, call can be transferred to refill team.   1. Which medications need to be refilled? (please list name of each medication and dose if known) simvastatin 40 MG 1 daily   2. Which pharmacy/location (including street and city if local pharmacy) is medication to be sent to? [facility] on [address] [phone] - patient wants [facility] removed from chart, will no longer use them   3. Do they need a 30 day or 90 day supply? 90 day

## 2019-05-01 ENCOUNTER — Telehealth: Payer: Self-pay

## 2019-05-01 NOTE — Progress Notes (Signed)
Subjective:   Patient ID: Brenda [name], female    DOB: August 27, 1951, 68 y.o.   MRN: [id number]  Brenda [name] is a pleasant 68 y.o. year old female who presents to clinic today with Follow-up (Pt screened at vehicle.  She is not currently fasting.  She sees her Hematologist next week who will be ordering a significant amount of tests. Foot exam completed.) and Annual Exam  on 05/02/2019  HPI:  She is doing well.  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  Health Maintenance  Topic Date Due  . FOOT EXAM  03/14/2019  . HEMOGLOBIN A1C  03/28/2019  . INFLUENZA VACCINE  06/02/2019  . MAMMOGRAM  09/14/2019  . TETANUS/TDAP  11/13/2019  . OPHTHALMOLOGY EXAM  03/11/2020  . COLONOSCOPY  08/31/2028  . DEXA SCAN  Completed  . Hepatitis C Screening  Completed  . PNA vac Low Risk Adult  Completed   SCC- two placed on right arm- found by her hand surgeon.  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 Was seeing ortho.  Has received cortisone injections earlier this year which were ineffective and caused her blood sugars to spike. She did finally have a shoulder replacement.  DM-  Now followed by Dr. Cruzita Lederer.  Was last seen on 02/26/19.  Note reviewed.  No changes made to rxs. Lab Results  Component Value Date   HGBA1C 6.6 (A) 09/27/2018   Currently taking metformin and glucotrol.  Has follow up scheduled with Dr. Cruzita Lederer in August.  [facility] Readings from Last 3 Encounters:  05/02/19 196 lb (88.9 kg)  11/10/18 190 lb 12.8 oz (86.5 kg)  10/11/18 196 lb (88.9 kg)    HLD- on Simvastatin 40 mg daily and zetia 10  Mg daily. Denies myalgias. Dr. Rockey Situ placed order for this to be done. Lab Results  Component Value Date   CHOL 168 04/12/2018   HDL 34 (L) 04/12/2018   LDLCALC 85 04/12/2018   LDLDIRECT 88.0 03/07/2017   TRIG 247 (H) 04/12/2018   CHOLHDL 4.9 04/12/2018    Anxiety/depression-   Currently taking  Wellbutrin XL 300 mg daily, and as needed  xanax.  Much improved since she is currently not working and reading, having time for herself! Depression screen [facility] 2/9 05/02/2019 03/13/2018 12/19/2017 03/07/2017  Decreased Interest 0 0 2 0  Down, Depressed, Hopeless 1 0 2 0  PHQ - 2 Score 1 0 4 0  Altered sleeping 1 - 3 -  Tired, decreased energy 1 - 1 -  Change in appetite 1 - 2 -  Feeling bad or failure about yourself  0 - 0 -  Trouble concentrating 0 - 0 -  Moving slowly or fidgety/restless 0 - 0 -  Suicidal thoughts 0 - 0 -  PHQ-9 Score 4 - 10 -  Difficult doing work/chores Not difficult at all - Extremely dIfficult -   GAD 7 : Generalized Anxiety Score 05/02/2019  Nervous, Anxious, on Edge 1  Control/stop worrying 1  Worry too much - different things 1  Trouble relaxing 0  Restless 0  Easily annoyed or irritable 0  Afraid - awful might happen 0  Total GAD 7 Score 3  Anxiety Difficulty Not difficult at all   HTN-doing better. Currently taking Toprol, cardura and benazapril-HCT 20-12.5 mg daily.  Denies HA, blurred vision, CP or SOB.  BP Readings from Last 3 Encounters:  05/02/19 124/68  11/10/18 (!) 164/84  10/11/18 Marland Kitchen)  144/74   CKD- Lab Results  Component Value Date   CREATININE 1.44 (H) 11/10/2018   CLL- followed by Dr. Irene Limbo. Was last seen on 11/10/18 and will be seeing him again next week.  Current Outpatient Medications on File Prior to Visit  Medication Sig Dispense Refill  . ALPRAZolam (XANAX) 1 MG tablet TAKE 1/2-1 TABLET BY MOUTH 2 TIMES A DAY AS NEEDED 60 tablet 5  . aspirin 81 MG tablet Take 81 mg by mouth daily.    . benazepril-hydrochlorthiazide (LOTENSIN HCT) 20-12.5 MG tablet Take 1 tablet by mouth daily. 90 tablet 3  . Blood Glucose Monitoring Suppl (FREESTYLE FREEDOM LITE) w/Device KIT Use to check blood sugar twice a day, E11.65 1 each 1  . buPROPion (WELLBUTRIN XL) 300 MG 24 hr tablet Take 1 tablet (300 mg total) by mouth daily. 90 tablet 2  . Cholecalciferol (VITAMIN D3) 2000 units TABS Take 2,000  mcg by mouth daily.    . Cyanocobalamin (B-12) 1000 MCG SUBL Place 1 tablet under the tongue daily.    Marland Kitchen doxazosin (CARDURA) 2 MG tablet TAKE 1 TABLET BY MOUTH DAILY. 30 tablet 11  . Esomeprazole Magnesium (NEXIUM PO) Take 22.5 mg by mouth daily.    Marland Kitchen ezetimibe (ZETIA) 10 MG tablet Take 1 tablet (10 mg total) by mouth daily. 90 tablet 3  . gabapentin (NEURONTIN) 300 MG capsule Take 1 capsule (300 mg total) by mouth at bedtime. 90 capsule 2  . glipiZIDE (GLUCOTROL XL) 2.5 MG 24 hr tablet Take 2.5 MG  By mouth 1x a day before b'fast 90 tablet 3  . glipiZIDE (GLUCOTROL) 5 MG tablet Take 1 tablet (5 mg total) by mouth daily before supper. 90 tablet 3  . glucose blood (FREESTYLE LITE) test strip TEST BLOOD SUGAR TWICE DAILY 200 each 0  . iron polysaccharides (NIFEREX) 150 MG capsule Take 150 mg by mouth daily.    Elmore Guise Devices (LANCING DEVICE) MISC Use as advised - Freestyle 1 each 0  . metFORMIN (GLUCOPHAGE-XR) 500 MG 24 hr tablet TAKE 2 TABLETS BY MOUTH TWICE A DAY WITH A MEAL. 360 tablet 0  . metoprolol succinate (TOPROL-XL) 25 MG 24 hr tablet TAKE 1 TABLET BY MOUTH DAILY. 90 tablet 2  . Multiple Vitamins-Minerals (HM MULTIVITAMIN ADULT GUMMY PO) Take 1 tablet by mouth daily.    . simvastatin (ZOCOR) 40 MG tablet Take 1 tablet (40 mg total) by mouth at bedtime. 90 tablet 0   No current facility-administered medications on file prior to visit.     Allergies  Allergen Reactions  . Amlodipine     Swelling      Past Medical History:  Diagnosis Date  . Anxiety   . Arthritis   . CLL (chronic lymphocytic leukemia) ([place])   . Depression    Wellbutrin  . Diabetes mellitus    Type II  . Endometrial ca ([place])    1998  . 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 GERD (gastroesophageal reflux disease)   . Heart murmur    patient states "cardiologist has not heard heart murmur for past several years"  . Heel spur   . History of squamous cell carcinoma in  situ 02/19/2019   [facility] - Pathology from right hand dorsal mass excision on 4.8.20  . Hyperlipidemia   . Hypertension   . Left leg swelling    "swelling in left leg from knee down when sitting for prolonged periods or being  on feet for a long period of time"  . PONV (postoperative nausea and vomiting)    after hysterectomy  . Renal insufficiency     Past Surgical History:  Procedure Laterality Date  . ABDOMINAL HYSTERECTOMY  1998  . COLONOSCOPY    . EXCISION MASS UPPER EXTREMETIES Right 02/07/2019   [facility] - right hand dorsal mass excision - Path-squamous cell carcinoma in situ  . EYE SURGERY Bilateral    cataracts  . EYE SURGERY Left    retina tear  . FEMUR IM NAIL  10/25/2012   Procedure: INTRAMEDULLARY (IM) NAIL FEMORAL;  Surgeon: Mauri Pole, MD;  [facility];  Service: Orthopedics;  Laterality: Left;  . HIP SURGERY    . left knee arthroscopy  12/2010  . REFRACTIVE SURGERY    . TOTAL KNEE ARTHROPLASTY  12/20/2011   Procedure: TOTAL KNEE ARTHROPLASTY;  Surgeon: Gearlean Alf, MD;  [facility];  Service: Orthopedics;  Laterality: Left;  Failed attempt at spinal   . TOTAL SHOULDER ARTHROPLASTY Right 08/19/2016   Procedure: RIGHT TOTAL SHOULDER ARTHROPLASTY;  Surgeon: Justice Britain, MD;  Location: [place];  Service: Orthopedics;  Laterality: Right;    Family History  Problem Relation Age of Onset  . Cancer Mother        breast cancer  . Cancer Father        plasma sarcoma  . Breast cancer Neg Hx     Social History   Socioeconomic History  . Marital status: Single    Spouse name: Not on file  . Number of children: Not on file  . Years of education: Not on file  . Highest education level: Not on file  Occupational History  . Occupation: Therapist, sports at [employer]: [employer]  Social Needs  . Financial resource strain: Not on file  . Food insecurity    Worry: Not on file    Inability: Not on file  . Transportation needs     Medical: Not on file    Non-medical: Not on file  Tobacco Use  . Smoking status: Never Smoker  . Smokeless tobacco: Never Used  Substance and Sexual Activity  . Alcohol use: No  . Drug use: No  . Sexual activity: Not on file  Lifestyle  . Physical activity    Days per week: Not on file    Minutes per session: Not on file  . Stress: Not on file  Relationships  . Social Herbalist on phone: Not on file    Gets together: Not on file    Attends religious service: Not on file    Active member of club or organization: Not on file    Attends meetings of clubs or organizations: Not on file    Relationship status: Not on file  . Intimate partner violence    Fear of current or ex partner: Not on file    Emotionally abused: Not on file    Physically abused: Not on file    Forced sexual activity: Not on file  Other Topics Concern  . 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    Review of Systems  Constitutional: Negative.   HENT: Negative.   Eyes: Negative.  Respiratory: Negative.   Cardiovascular: Negative.   Gastrointestinal: Negative.   Endocrine: Negative.   Genitourinary: Negative.   Musculoskeletal: Positive for arthralgias. Negative for myalgias, neck pain and neck stiffness.  Skin: Negative.   Allergic/Immunologic: Negative.   Neurological: Negative.   Hematological: Negative.   Psychiatric/Behavioral: Positive for sleep disturbance. Negative for agitation, behavioral problems, confusion, decreased concentration, dysphoric mood, hallucinations, self-injury and suicidal ideas. The patient is nervous/anxious. The patient is not hyperactive.      Objective:    BP 124/68 (BP Location: Left Arm, Patient Position: Sitting, Cuff Size: Normal)   Pulse 70   Temp 98.9 F (37.2 C) (Oral)   Ht 5' 1"   (1.549 m)   Wt 196 lb (88.9 kg)   SpO2 97%   BMI 37.03 kg/m   Wt Readings from Last 3 Encounters:  05/02/19 196 lb (88.9 kg)  11/10/18 190 lb 12.8 oz (86.5 kg)  10/11/18 196 lb (88.9 kg)     Physical Exam  Constitutional: She is oriented to person, place, and time. She appears well-developed and well-nourished. No distress.  HENT:  Head: Normocephalic and atraumatic.  Eyes: Conjunctivae are normal.  Neck: Normal range of motion.  Cardiovascular: Normal rate and regular rhythm.  Pulmonary/Chest: Effort normal and breath sounds normal. No respiratory distress.  Abdominal: Soft. Bowel sounds are normal. She exhibits no distension. There is no abdominal tenderness.  Musculoskeletal: Normal range of motion.        General: No edema.  Neurological: She is alert and oriented to person, place, and time. No cranial nerve deficit.  Skin: Skin is warm and dry.  Psychiatric: She has a normal mood and affect. Her behavior is normal. Judgment normal.  Nursing note and vitals reviewed.      Assessment & Plan:   Type 2 diabetes mellitus with hyperglycemia, without long-term current use of insulin (HCC) - Plan  Mixed hyperlipidemia - Plan:   Generalized anxiety disorder - Plan:   Essential hypertension - Plan:   Major depressive disorder with single episode, in remission ([place]) - Plan:   CLL (chronic lymphocytic leukemia) ([place]) - Plan:   CKD (chronic kidney disease) stage 3, GFR 30-59 ml/min ([place]) - Plan: No follow-ups on file.

## 2019-05-01 NOTE — Telephone Encounter (Signed)
Questions for Screening COVID-19  Symptom onset: None  Travel or Contacts: None  During this illness, did/does the patient experience any of the following symptoms? Fever >100.71F []   Yes [x]   No []   Unknown Subjective fever (felt feverish) []   Yes [x]   No []   Unknown Chills []   Yes []   No []   Unknown Muscle aches (myalgia) []   Yes [x]   No []   Unknown Runny nose (rhinorrhea) []   Yes [x]   No []   Unknown Sore throat []   Yes [x]   No []   Unknown Cough (new onset or worsening of chronic cough) []   Yes [x]   No []   Unknown Shortness of breath (dyspnea) []   Yes [x]   No []   Unknown Nausea or vomiting []   Yes [x]   No []   Unknown Headache []   Yes [x]   No []   Unknown Abdominal pain  []   Yes [x]   No []   Unknown Diarrhea (?3 loose/looser than normal stools/24hr period) []   Yes [x]   No []   Unknown Other, specify:  Patient risk factors: Smoker? []   Current []   Former []   Never If female, currently pregnant? []   Yes []   No  Patient Active Problem List   Diagnosis Date Noted  . CKD (chronic kidney disease) stage 3, GFR 30-59 ml/min (HCC) 07/05/2018  . CLL (chronic lymphocytic leukemia) ([place]) 03/13/2018  . Medicare annual wellness visit, subsequent 03/13/2018  . Anxiety 03/13/2018  . Bilateral edema of lower extremity 01/18/2018  . Pleural effusion, bilateral 12/19/2017  . Acute on chronic renal failure ([place]) 11/28/2017  . Hypokalemia 11/28/2017  . History of endometrial cancer 11/18/2017  . Pelvic lymphadenopathy 11/18/2017  . Microcytic anemia 11/18/2017  . Daytime sleepiness 08/22/2017  . Medication side effect, initial encounter 08/22/2017  . Coronary artery disease of native artery of native heart with stable angina pectoris ([place]) 04/03/2017  . S/P shoulder replacement, right 08/19/2016  . Type 2 diabetes mellitus with hyperglycemia, without long-term current use of insulin ([place]) 10/15/2015  . Obesity 01/24/2015  . Generalized anxiety disorder 01/02/2015  . Chronic radicular low back  pain 04/01/2014  . Tachycardia 01/31/2013  . Hip fracture, left ([place]) 10/24/2012  . Postop Hyponatremia 12/21/2011  . OA (osteoarthritis) 09/20/2011  . Mixed hyperlipidemia 11/06/2010  . Depression 11/06/2010  . Essential hypertension 11/06/2010  . GERD 11/06/2010    Plan:  []   High risk for COVID-19 with red flags go to ED (with CP, SOB, weak/lightheaded, or fever > 101.5). Call ahead.  []   High risk for COVID-19 but stable. Inform provider and coordinate time for [facility] visit.   []   No red flags but URI signs or symptoms okay for [facility] visit.

## 2019-05-02 ENCOUNTER — Encounter: Payer: Self-pay | Admitting: Family Medicine

## 2019-05-02 ENCOUNTER — Ambulatory Visit (INDEPENDENT_AMBULATORY_CARE_PROVIDER_SITE_OTHER): Payer: Medicare Other | Admitting: Family Medicine

## 2019-05-02 VITALS — BP 124/68 | HR 70 | Temp 98.9°F | Ht 61.0 in | Wt 196.0 lb

## 2019-05-02 DIAGNOSIS — E782 Mixed hyperlipidemia: Secondary | ICD-10-CM

## 2019-05-02 DIAGNOSIS — N183 Chronic kidney disease, stage 3 unspecified: Secondary | ICD-10-CM

## 2019-05-02 DIAGNOSIS — I10 Essential (primary) hypertension: Secondary | ICD-10-CM

## 2019-05-02 DIAGNOSIS — F325 Major depressive disorder, single episode, in full remission: Secondary | ICD-10-CM | POA: Diagnosis not present

## 2019-05-02 DIAGNOSIS — C44622 Squamous cell carcinoma of skin of right upper limb, including shoulder: Secondary | ICD-10-CM | POA: Diagnosis not present

## 2019-05-02 DIAGNOSIS — E1165 Type 2 diabetes mellitus with hyperglycemia: Secondary | ICD-10-CM | POA: Diagnosis not present

## 2019-05-02 DIAGNOSIS — Z7189 Other specified counseling: Secondary | ICD-10-CM | POA: Insufficient documentation

## 2019-05-02 DIAGNOSIS — F411 Generalized anxiety disorder: Secondary | ICD-10-CM | POA: Diagnosis not present

## 2019-05-02 DIAGNOSIS — C9110 Chronic lymphocytic leukemia of B-cell type not having achieved remission: Secondary | ICD-10-CM

## 2019-05-02 DIAGNOSIS — D509 Iron deficiency anemia, unspecified: Secondary | ICD-10-CM

## 2019-05-02 DIAGNOSIS — Z0000 Encounter for general adult medical examination without abnormal findings: Secondary | ICD-10-CM

## 2019-05-02 NOTE — Patient Instructions (Signed)
Great to see you.  Try debrox OTC for 1 week and keep me updated.

## 2019-05-02 NOTE — Assessment & Plan Note (Signed)
Dr. Irene Limbo has ordered CBC, ferritin for next week.

## 2019-05-02 NOTE — Assessment & Plan Note (Signed)
Much improved, likely due to decreased stressors. No changes made.

## 2019-05-02 NOTE — Assessment & Plan Note (Signed)
Two places found on her hand by hand surgeon- will refer to dermatology for full skin evaluation.

## 2019-05-02 NOTE — Assessment & Plan Note (Signed)
Followed by Dr. Rockey Situ.  Orders in system for her to get lipids checked which she will do when she gets her cancer labs done next week.

## 2019-05-02 NOTE — Assessment & Plan Note (Signed)
High risk for COVID- letter written for her employer and sent to her through my chart (also given pt a copy in the office).

## 2019-05-02 NOTE — Assessment & Plan Note (Signed)
Well controlled.  No changes made.

## 2019-05-02 NOTE — Assessment & Plan Note (Signed)
Followed by Dr. Cruzita Lederer- deferring a1c today as she knows Dr. Cruzita Lederer will repeat it in one month.

## 2019-05-02 NOTE — Assessment & Plan Note (Signed)
Reviewed preventive care protocols, scheduled due services, and updated immunizations Discussed nutrition, exercise, diet, and healthy lifestyle.  
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
## 2019-05-11 ENCOUNTER — Encounter: Payer: Self-pay | Admitting: Family Medicine

## 2019-05-11 ENCOUNTER — Inpatient Hospital Stay: Payer: Medicare Other

## 2019-05-11 ENCOUNTER — Telehealth: Payer: Self-pay | Admitting: Hematology

## 2019-05-11 ENCOUNTER — Other Ambulatory Visit: Payer: Self-pay

## 2019-05-11 ENCOUNTER — Inpatient Hospital Stay: Payer: Medicare Other | Attending: Hematology | Admitting: Hematology

## 2019-05-11 VITALS — BP 164/85 | HR 107 | Temp 98.7°F | Resp 16 | Ht 61.0 in | Wt 194.7 lb

## 2019-05-11 DIAGNOSIS — D509 Iron deficiency anemia, unspecified: Secondary | ICD-10-CM | POA: Diagnosis not present

## 2019-05-11 DIAGNOSIS — Z7984 Long term (current) use of oral hypoglycemic drugs: Secondary | ICD-10-CM | POA: Insufficient documentation

## 2019-05-11 DIAGNOSIS — Z8542 Personal history of malignant neoplasm of other parts of uterus: Secondary | ICD-10-CM

## 2019-05-11 DIAGNOSIS — C9110 Chronic lymphocytic leukemia of B-cell type not having achieved remission: Secondary | ICD-10-CM | POA: Diagnosis not present

## 2019-05-11 DIAGNOSIS — I10 Essential (primary) hypertension: Secondary | ICD-10-CM

## 2019-05-11 DIAGNOSIS — E119 Type 2 diabetes mellitus without complications: Secondary | ICD-10-CM | POA: Insufficient documentation

## 2019-05-11 DIAGNOSIS — R918 Other nonspecific abnormal finding of lung field: Secondary | ICD-10-CM | POA: Insufficient documentation

## 2019-05-11 DIAGNOSIS — Z90710 Acquired absence of both cervix and uterus: Secondary | ICD-10-CM | POA: Insufficient documentation

## 2019-05-11 DIAGNOSIS — E785 Hyperlipidemia, unspecified: Secondary | ICD-10-CM

## 2019-05-11 DIAGNOSIS — N289 Disorder of kidney and ureter, unspecified: Secondary | ICD-10-CM | POA: Diagnosis not present

## 2019-05-11 DIAGNOSIS — Z79899 Other long term (current) drug therapy: Secondary | ICD-10-CM | POA: Insufficient documentation

## 2019-05-11 DIAGNOSIS — Z7982 Long term (current) use of aspirin: Secondary | ICD-10-CM | POA: Insufficient documentation

## 2019-05-11 DIAGNOSIS — E875 Hyperkalemia: Secondary | ICD-10-CM

## 2019-05-11 LAB — CBC WITH DIFFERENTIAL/PLATELET
Abs Immature Granulocytes: 0.04 10*3/uL (ref 0.00–0.07)
Basophils Absolute: 0.1 10*3/uL (ref 0.0–0.1)
Basophils Relative: 1 %
Eosinophils Absolute: 0.2 10*3/uL (ref 0.0–0.5)
Eosinophils Relative: 2 %
HCT: 40.1 % (ref 36.0–46.0)
Hemoglobin: 12.3 g/dL (ref 12.0–15.0)
Immature Granulocytes: 1 %
Lymphocytes Relative: 44 %
Lymphs Abs: 3.9 10*3/uL (ref 0.7–4.0)
MCH: 26.7 pg (ref 26.0–34.0)
MCHC: 30.7 g/dL (ref 30.0–36.0)
MCV: 87 fL (ref 80.0–100.0)
Monocytes Absolute: 0.5 10*3/uL (ref 0.1–1.0)
Monocytes Relative: 6 %
Neutro Abs: 4.1 10*3/uL (ref 1.7–7.7)
Neutrophils Relative %: 46 %
Platelets: 167 10*3/uL (ref 150–400)
RBC: 4.61 MIL/uL (ref 3.87–5.11)
RDW: 13.3 % (ref 11.5–15.5)
WBC: 8.8 10*3/uL (ref 4.0–10.5)
nRBC: 0 % (ref 0.0–0.2)

## 2019-05-11 LAB — LACTATE DEHYDROGENASE: LDH: 198 U/L — ABNORMAL HIGH (ref 98–192)

## 2019-05-11 LAB — CMP (CANCER CENTER ONLY)
ALT: 17 U/L (ref 0–44)
AST: 22 U/L (ref 15–41)
Albumin: 4.3 g/dL (ref 3.5–5.0)
Alkaline Phosphatase: 90 U/L (ref 38–126)
Anion gap: 14 (ref 5–15)
BUN: 22 mg/dL (ref 8–23)
CO2: 21 mmol/L — ABNORMAL LOW (ref 22–32)
Calcium: 9.5 mg/dL (ref 8.9–10.3)
Chloride: 106 mmol/L (ref 98–111)
Creatinine: 1.64 mg/dL — ABNORMAL HIGH (ref 0.44–1.00)
GFR, Est AFR Am: 36 mL/min — ABNORMAL LOW (ref >60)
GFR, Estimated: 31 mL/min — ABNORMAL LOW (ref >60)
Glucose, Bld: 205 mg/dL — ABNORMAL HIGH (ref 70–99)
Potassium: 5.6 mmol/L — ABNORMAL HIGH (ref 3.5–5.1)
Sodium: 141 mmol/L (ref 135–145)
Total Bilirubin: 0.7 mg/dL (ref 0.3–1.2)
Total Protein: 6.7 g/dL (ref 6.5–8.1)

## 2019-05-11 LAB — FERRITIN: Ferritin: 34 ng/mL (ref 11–307)

## 2019-05-11 MED ORDER — SODIUM POLYSTYRENE SULFONATE 15 GM/60ML PO SUSP: 15.0000 g | ORAL | 0 refills | Status: DC

## 2019-05-11 MED ORDER — SODIUM POLYSTYRENE SULFONATE 15 GM/60ML PO SUSP: 15.0000 g | ORAL | 0 refills | Status: AC

## 2019-05-11 NOTE — Telephone Encounter (Signed)
Scheduled appt per 7/10 los.  Patient declined calendar and avs.

## 2019-05-14 ENCOUNTER — Other Ambulatory Visit: Payer: Self-pay

## 2019-05-14 DIAGNOSIS — E875 Hyperkalemia: Secondary | ICD-10-CM

## 2019-05-15 ENCOUNTER — Telehealth: Payer: Self-pay

## 2019-05-15 NOTE — Telephone Encounter (Signed)
During this illness, did/does the patient experience any of the following symptoms? Fever >100.31F []   Yes [x]   No []   Unknown Subjective fever (felt feverish) []   Yes [x]   No []   Unknown Chills []   Yes [x]   No []   Unknown Muscle aches (myalgia) []   Yes [x]   No []   Unknown Runny nose (rhinorrhea) []   Yes [x]   No []   Unknown Sore throat []   Yes [x]   No []   Unknown Cough (new onset or worsening of chronic cough) []   Yes [x]   No []   Unknown Shortness of breath (dyspnea) []   Yes [x]   No []   Unknown Nausea or vomiting []   Yes [x]   No []   Unknown Headache []   Yes [x]   No []   Unknown Abdominal pain  []   Yes [x]   No []   Unknown Diarrhea (?3 loose/looser than normal stools/24hr period) []   Yes [x]   No []   Unknown Other, specify:

## 2019-05-16 ENCOUNTER — Other Ambulatory Visit (INDEPENDENT_AMBULATORY_CARE_PROVIDER_SITE_OTHER): Payer: Medicare Other

## 2019-05-16 DIAGNOSIS — E875 Hyperkalemia: Secondary | ICD-10-CM

## 2019-05-16 LAB — COMPREHENSIVE METABOLIC PANEL
ALT: 13 U/L (ref 0–35)
AST: 18 U/L (ref 0–37)
Albumin: 4.5 g/dL (ref 3.5–5.2)
Alkaline Phosphatase: 76 U/L (ref 39–117)
BUN: 34 mg/dL — ABNORMAL HIGH (ref 6–23)
CO2: 22 mEq/L (ref 19–32)
Calcium: 9 mg/dL (ref 8.4–10.5)
Chloride: 100 mEq/L (ref 96–112)
Creatinine, Ser: 1.63 mg/dL — ABNORMAL HIGH (ref 0.40–1.20)
GFR: 31.16 mL/min — ABNORMAL LOW (ref >60.00)
Glucose, Bld: 127 mg/dL — ABNORMAL HIGH (ref 70–99)
Potassium: 4.5 mEq/L (ref 3.5–5.1)
Sodium: 132 mEq/L — ABNORMAL LOW (ref 135–145)
Total Bilirubin: 0.8 mg/dL (ref 0.2–1.2)
Total Protein: 6.4 g/dL (ref 6.0–8.3)

## 2019-05-17 ENCOUNTER — Other Ambulatory Visit: Payer: Self-pay | Admitting: Family Medicine

## 2019-05-17 ENCOUNTER — Encounter: Payer: Self-pay | Admitting: Family Medicine

## 2019-05-17 NOTE — Telephone Encounter (Signed)
Last Rx sent was 9 months ago. Please advise. No Future scheduled appt with Dr. Deborra Medina. Thanks

## 2019-05-21 ENCOUNTER — Encounter: Payer: Self-pay | Admitting: Family Medicine

## 2019-05-21 ENCOUNTER — Other Ambulatory Visit: Payer: Self-pay

## 2019-05-21 DIAGNOSIS — E875 Hyperkalemia: Secondary | ICD-10-CM

## 2019-05-23 ENCOUNTER — Encounter: Payer: Self-pay | Admitting: Family Medicine

## 2019-05-23 ENCOUNTER — Other Ambulatory Visit (INDEPENDENT_AMBULATORY_CARE_PROVIDER_SITE_OTHER): Payer: Medicare Other

## 2019-05-23 ENCOUNTER — Other Ambulatory Visit: Payer: Self-pay | Admitting: Family Medicine

## 2019-05-23 DIAGNOSIS — N184 Chronic kidney disease, stage 4 (severe): Secondary | ICD-10-CM

## 2019-05-23 DIAGNOSIS — E875 Hyperkalemia: Secondary | ICD-10-CM

## 2019-05-23 DIAGNOSIS — E1165 Type 2 diabetes mellitus with hyperglycemia: Secondary | ICD-10-CM

## 2019-05-23 LAB — BASIC METABOLIC PANEL
BUN: 62 mg/dL — ABNORMAL HIGH (ref 6–23)
CO2: 20 mEq/L (ref 19–32)
Calcium: 9.7 mg/dL (ref 8.4–10.5)
Chloride: 108 mEq/L (ref 96–112)
Creatinine, Ser: 1.93 mg/dL — ABNORMAL HIGH (ref 0.40–1.20)
GFR: 25.64 mL/min — ABNORMAL LOW (ref >60.00)
Glucose, Bld: 174 mg/dL — ABNORMAL HIGH (ref 70–99)
Potassium: 5.8 mEq/L — ABNORMAL HIGH (ref 3.5–5.1)
Sodium: 136 mEq/L (ref 135–145)

## 2019-05-23 MED ORDER — LANTUS SOLOSTAR 100 UNIT/ML ~~LOC~~ SOPN: 10.0000 [IU] | PEN_INJECTOR | Freq: Every day | SUBCUTANEOUS | 99 refills | Status: DC

## 2019-05-23 NOTE — Progress Notes (Signed)
Spoke with pt-  1.  Potassium is very high again- take another dose of kayexelate now. 2  GFR has dropped down to 25.64, Cr I 1/93 and BUN is 62 urgent referral to nephrology. 3.  STOP METFORMIN.  Start Lantus 10 units tonight. 4. Return to clinic on Friday for labs.  Check sugars 3 times daily- she will update me daily.

## 2019-05-24 ENCOUNTER — Encounter: Payer: Self-pay | Admitting: Family Medicine

## 2019-05-25 ENCOUNTER — Other Ambulatory Visit (INDEPENDENT_AMBULATORY_CARE_PROVIDER_SITE_OTHER): Payer: Medicare Other

## 2019-05-25 ENCOUNTER — Other Ambulatory Visit: Payer: Self-pay

## 2019-05-25 DIAGNOSIS — E1165 Type 2 diabetes mellitus with hyperglycemia: Secondary | ICD-10-CM | POA: Diagnosis not present

## 2019-05-25 DIAGNOSIS — N184 Chronic kidney disease, stage 4 (severe): Secondary | ICD-10-CM | POA: Diagnosis not present

## 2019-05-25 LAB — COMPREHENSIVE METABOLIC PANEL
ALT: 14 U/L (ref 0–35)
AST: 19 U/L (ref 0–37)
Albumin: 4.6 g/dL (ref 3.5–5.2)
Alkaline Phosphatase: 76 U/L (ref 39–117)
BUN: 40 mg/dL — ABNORMAL HIGH (ref 6–23)
CO2: 23 mEq/L (ref 19–32)
Calcium: 9.8 mg/dL (ref 8.4–10.5)
Chloride: 106 mEq/L (ref 96–112)
Creatinine, Ser: 1.47 mg/dL — ABNORMAL HIGH (ref 0.40–1.20)
GFR: 35.1 mL/min — ABNORMAL LOW (ref >60.00)
Glucose, Bld: 200 mg/dL — ABNORMAL HIGH (ref 70–99)
Potassium: 4.8 mEq/L (ref 3.5–5.1)
Sodium: 138 mEq/L (ref 135–145)
Total Bilirubin: 0.7 mg/dL (ref 0.2–1.2)
Total Protein: 6.4 g/dL (ref 6.0–8.3)

## 2019-05-25 LAB — RENAL FUNCTION PANEL
Albumin: 4.6 g/dL (ref 3.5–5.2)
BUN: 40 mg/dL — ABNORMAL HIGH (ref 6–23)
CO2: 23 mEq/L (ref 19–32)
Calcium: 9.8 mg/dL (ref 8.4–10.5)
Chloride: 106 mEq/L (ref 96–112)
Creatinine, Ser: 1.47 mg/dL — ABNORMAL HIGH (ref 0.40–1.20)
GFR: 35.1 mL/min — ABNORMAL LOW (ref >60.00)
Glucose, Bld: 200 mg/dL — ABNORMAL HIGH (ref 70–99)
Phosphorus: 3.7 mg/dL (ref 2.3–4.6)
Potassium: 4.8 mEq/L (ref 3.5–5.1)
Sodium: 138 mEq/L (ref 135–145)

## 2019-05-25 LAB — HEMOGLOBIN A1C: Hgb A1c MFr Bld: 8.1 % — ABNORMAL HIGH (ref 4.6–6.5)

## 2019-05-26 ENCOUNTER — Encounter: Payer: Self-pay | Admitting: Family Medicine

## 2019-05-29 ENCOUNTER — Encounter: Payer: Self-pay | Admitting: Internal Medicine

## 2019-05-29 ENCOUNTER — Other Ambulatory Visit: Payer: Self-pay

## 2019-05-29 ENCOUNTER — Encounter: Payer: Self-pay | Admitting: Family Medicine

## 2019-05-29 MED ORDER — LANCING DEVICE MISC: 0 refills | Status: AC

## 2019-05-29 MED ORDER — FREESTYLE LITE TEST VI STRP: ORAL_STRIP | 0 refills | Status: DC

## 2019-05-30 ENCOUNTER — Other Ambulatory Visit: Payer: Self-pay

## 2019-05-30 ENCOUNTER — Other Ambulatory Visit (INDEPENDENT_AMBULATORY_CARE_PROVIDER_SITE_OTHER): Payer: Medicare Other

## 2019-05-30 ENCOUNTER — Other Ambulatory Visit: Payer: Medicare Other

## 2019-05-30 DIAGNOSIS — E782 Mixed hyperlipidemia: Secondary | ICD-10-CM | POA: Diagnosis not present

## 2019-05-30 DIAGNOSIS — I25118 Atherosclerotic heart disease of native coronary artery with other forms of angina pectoris: Secondary | ICD-10-CM | POA: Diagnosis not present

## 2019-05-30 DIAGNOSIS — E875 Hyperkalemia: Secondary | ICD-10-CM

## 2019-05-30 LAB — COMPREHENSIVE METABOLIC PANEL
ALT: 19 U/L (ref 0–35)
AST: 21 U/L (ref 0–37)
Albumin: 4.4 g/dL (ref 3.5–5.2)
Alkaline Phosphatase: 81 U/L (ref 39–117)
BUN: 35 mg/dL — ABNORMAL HIGH (ref 6–23)
CO2: 22 mEq/L (ref 19–32)
Calcium: 9.7 mg/dL (ref 8.4–10.5)
Chloride: 102 mEq/L (ref 96–112)
Creatinine, Ser: 1.46 mg/dL — ABNORMAL HIGH (ref 0.40–1.20)
GFR: 35.38 mL/min — ABNORMAL LOW (ref >60.00)
Glucose, Bld: 190 mg/dL — ABNORMAL HIGH (ref 70–99)
Potassium: 4.5 mEq/L (ref 3.5–5.1)
Sodium: 135 mEq/L (ref 135–145)
Total Bilirubin: 0.7 mg/dL (ref 0.2–1.2)
Total Protein: 6.4 g/dL (ref 6.0–8.3)

## 2019-05-30 LAB — LIPID PANEL
Cholesterol: 109 mg/dL (ref 0–200)
HDL: 31.9 mg/dL — ABNORMAL LOW (ref >39.00)
LDL Cholesterol: 50 mg/dL (ref 0–99)
NonHDL: 76.99
Total CHOL/HDL Ratio: 3
Triglycerides: 134 mg/dL (ref 0.0–149.0)
VLDL: 26.8 mg/dL (ref 0.0–40.0)

## 2019-05-30 LAB — HEPATIC FUNCTION PANEL
ALT: 18 U/L (ref 0–35)
AST: 21 U/L (ref 0–37)
Albumin: 4.4 g/dL (ref 3.5–5.2)
Alkaline Phosphatase: 82 U/L (ref 39–117)
Bilirubin, Direct: 0.2 mg/dL (ref 0.0–0.3)
Total Bilirubin: 0.7 mg/dL (ref 0.2–1.2)
Total Protein: 6.4 g/dL (ref 6.0–8.3)

## 2019-06-04 ENCOUNTER — Encounter: Payer: Self-pay | Admitting: Family Medicine

## 2019-06-05 NOTE — Telephone Encounter (Signed)
Potassium had normalized on labs from 7/29 and renal function improved.    I think she can hold the last dose of kayexalate for now.

## 2019-06-13 DIAGNOSIS — M65341 Trigger finger, right ring finger: Secondary | ICD-10-CM | POA: Diagnosis not present

## 2019-06-13 DIAGNOSIS — M65331 Trigger finger, right middle finger: Secondary | ICD-10-CM | POA: Diagnosis not present

## 2019-06-13 DIAGNOSIS — M79641 Pain in right hand: Secondary | ICD-10-CM | POA: Diagnosis not present

## 2019-06-14 DIAGNOSIS — Z86007 Personal history of in-situ neoplasm of skin: Secondary | ICD-10-CM | POA: Diagnosis not present

## 2019-06-14 DIAGNOSIS — Z8589 Personal history of malignant neoplasm of other organs and systems: Secondary | ICD-10-CM | POA: Diagnosis not present

## 2019-06-14 DIAGNOSIS — Z96611 Presence of right artificial shoulder joint: Secondary | ICD-10-CM | POA: Diagnosis not present

## 2019-06-14 DIAGNOSIS — L821 Other seborrheic keratosis: Secondary | ICD-10-CM | POA: Diagnosis not present

## 2019-06-14 DIAGNOSIS — M25512 Pain in left shoulder: Secondary | ICD-10-CM | POA: Diagnosis not present

## 2019-06-14 DIAGNOSIS — L300 Nummular dermatitis: Secondary | ICD-10-CM | POA: Diagnosis not present

## 2019-06-14 DIAGNOSIS — Z471 Aftercare following joint replacement surgery: Secondary | ICD-10-CM | POA: Diagnosis not present

## 2019-06-15 ENCOUNTER — Other Ambulatory Visit: Payer: Self-pay | Admitting: Family Medicine

## 2019-06-18 NOTE — Telephone Encounter (Signed)
TA-Plz see refill req/LOV 7.2020/per [place] PMP pt is compliant without any red flags/plz advise/thx dmf

## 2019-06-22 ENCOUNTER — Encounter: Payer: Self-pay | Admitting: Family Medicine

## 2019-06-26 ENCOUNTER — Telehealth: Payer: Self-pay | Admitting: Behavioral Health

## 2019-06-26 NOTE — Telephone Encounter (Signed)

## 2019-06-27 ENCOUNTER — Ambulatory Visit: Payer: Medicare Other

## 2019-06-27 ENCOUNTER — Ambulatory Visit (INDEPENDENT_AMBULATORY_CARE_PROVIDER_SITE_OTHER): Payer: Medicare Other | Admitting: Behavioral Health

## 2019-06-27 ENCOUNTER — Other Ambulatory Visit: Payer: Self-pay

## 2019-06-27 ENCOUNTER — Encounter: Payer: Self-pay | Admitting: Family Medicine

## 2019-06-27 DIAGNOSIS — Z23 Encounter for immunization: Secondary | ICD-10-CM

## 2019-06-27 NOTE — Progress Notes (Signed)
Patient presents in clinic today for Influenza vaccination. IM injection was given in the left deltoid. Patient tolerated the injection well. No signs or symptoms of a reaction were noted prior to patient leaving the nurse visit.

## 2019-06-28 DIAGNOSIS — M65341 Trigger finger, right ring finger: Secondary | ICD-10-CM | POA: Diagnosis not present

## 2019-06-28 DIAGNOSIS — M65331 Trigger finger, right middle finger: Secondary | ICD-10-CM | POA: Diagnosis not present

## 2019-06-28 DIAGNOSIS — L608 Other nail disorders: Secondary | ICD-10-CM | POA: Diagnosis not present

## 2019-07-02 ENCOUNTER — Encounter: Payer: Self-pay | Admitting: Internal Medicine

## 2019-07-02 ENCOUNTER — Ambulatory Visit (INDEPENDENT_AMBULATORY_CARE_PROVIDER_SITE_OTHER): Payer: Medicare Other | Admitting: Internal Medicine

## 2019-07-02 ENCOUNTER — Other Ambulatory Visit: Payer: Self-pay

## 2019-07-02 VITALS — BP 100/58 | HR 64 | Ht 59.25 in | Wt 183.0 lb

## 2019-07-02 DIAGNOSIS — E782 Mixed hyperlipidemia: Secondary | ICD-10-CM

## 2019-07-02 DIAGNOSIS — I25118 Atherosclerotic heart disease of native coronary artery with other forms of angina pectoris: Secondary | ICD-10-CM | POA: Diagnosis not present

## 2019-07-02 DIAGNOSIS — E1165 Type 2 diabetes mellitus with hyperglycemia: Secondary | ICD-10-CM | POA: Diagnosis not present

## 2019-07-02 DIAGNOSIS — Z6835 Body mass index (BMI) 35.0-35.9, adult: Secondary | ICD-10-CM

## 2019-07-02 LAB — POCT GLYCOSYLATED HEMOGLOBIN (HGB A1C): Hemoglobin A1C: 6.3 % — AB (ref 4.0–5.6)

## 2019-07-02 MED ORDER — FREESTYLE LANCETS MISC: 5 refills | Status: DC

## 2019-07-02 MED ORDER — INSULIN PEN NEEDLE 32G X 4 MM MISC: 3 refills | Status: DC

## 2019-07-02 MED ORDER — LANTUS SOLOSTAR 100 UNIT/ML ~~LOC~~ SOPN: 12.0000 [IU] | PEN_INJECTOR | Freq: Every day | SUBCUTANEOUS | 11 refills | Status: DC

## 2019-07-02 MED ORDER — GLIPIZIDE 5 MG PO TABS: 2.5000 mg | ORAL_TABLET | Freq: Every day | ORAL | 3 refills | Status: DC

## 2019-07-02 NOTE — Patient Instructions (Addendum)
Please continue: - Lantus 12 units in am  Please stop: - Glipizide ER  Decrease: - Glipizide 2.5 mg before dinner (with a larger meal)  Please let me know if you continue to have sugars <70.  Please return in 3-4 months with your sugar log.

## 2019-07-02 NOTE — Addendum Note (Signed)
Addended by: Cardell Peach I on: 07/02/2019 11:44 AM   Modules accepted: Orders

## 2019-07-02 NOTE — Progress Notes (Signed)
Patient ID: Brenda [name], female   DOB: 01-12-1951, 68 y.o.   MRN: [id number]  HPI: Brenda [name] is a 68 y.o.-year-old female, presenting presenting for follow-up for DM2, dx in 2007, non-insulin-dependent, uncontrolled, with complications (CKD). Last visit 4 months ago (virtual).  Since last visit, her metformin was stopped by PCP (due to worsening kidney function) and she was started on insulin.  Her sugars improved afterwards.  Last hemoglobin A1c was: Lab Results  Component Value Date   HGBA1C 8.1 (H) 05/25/2019   HGBA1C 6.6 (A) 09/27/2018   HGBA1C 6.2 (A) 05/24/2018   Pt is on a regimen of: - Metformin ER 1000 mg 2x a day >> stopped by PCP 2/2 CKD  - Lantus 12 units in am - started 05/2019 by PCP - Glipizide ER 2.5 mg before breakfast - Glipizide 2.5-5 mg before dinner She was on Glipizide in 11/2015 after a steroid inj. We stopped Victoza due to nausea.  Pt checks her sugars twice a day: - am: 83-139, 144, 162 >> 94-168, 190 >> 98-150, 278 (ave 120-130s) >> 52, 66-134, 154, 178, 183 - 2h after b'fast: 156 >> n/c >> 148 >> n/c - before lunch: 69-87 >> 74-119 >> n/c >> 75-115 - 2h after lunch: 118, 123 >> 92, 99 >> n/c - before dinner: 66, 71-127, 140 >> 90-138, 141 >> 110-130 >> 60-114 - 2h after dinner:n/c >> 138, 148 >> n/c - bedtime: 74, 80-170 >> 91-143 >> n/c - nighttime: n/c Lowest sugar was 37 x1 >> ... >> 94 >> 98 >> 52; she has hypoglycemia awareness in the 70s. Highest sugar was 190 >> 278 (steroids) >> 184.  Glucometer: Freestyle Lite  Pt's meals are: - Breakfast: English muffin + preserve or cereal or yoghurt - snack: fruit or fruit + yoghurt - Lunch: 1/2 sandwich + chips + salsa + fruit/sugar free - Dinner: soup or chicken/steak + veggies, occasional starch or Lean cuisine or light salad No soft drinks.  -+ CKD, last BUN/creatinine:  Lab Results  Component Value Date   BUN 35 (H) 05/30/2019   CREATININE 1.46 (H) 05/30/2019  On benazepril. She  had a high potassium (5.9) >> used Kayexalate x3 >> latest K 4.5. -+ HL; last set of lipids: Lab Results  Component Value Date   CHOL 109 05/30/2019   HDL 31.90 (L) 05/30/2019   LDLCALC 50 05/30/2019   LDLDIRECT 88.0 03/07/2017   TRIG 134.0 05/30/2019   CHOLHDL 3 05/30/2019  On simvastatin and Zetia. - last eye exam 03/2018: No DR; she had cataract surgery in 2016.  She has a history of retinal tear. -No numbness and tingling in her feet.  On Neurontin for leg cramps at night  She also has HTN, GERD, depression.  She was admitted 11/2017 for dehydration, anemia, and acute renal failure >> diagnosed with CLL.  ROS: Constitutional: no weight gain/no weight loss, no fatigue, no subjective hyperthermia, no subjective hypothermia Eyes: no blurry vision, no xerophthalmia ENT: no sore throat, no nodules palpated in neck, no dysphagia, no odynophagia, no hoarseness Cardiovascular: no CP/no SOB/no palpitations/no leg swelling Respiratory: no cough/no SOB/no wheezing Gastrointestinal: no N/no V/no D/no C/no acid reflux Musculoskeletal: no muscle aches/+ joint aches (shoulder) Skin: no rashes, no hair loss Neurological: no tremors/no numbness/no tingling/no dizziness  I reviewed pt's medications, allergies, PMH, social hx, family hx, and changes were documented in the history of present illness. Otherwise, unchanged from my initial visit note.  Past Medical History:  Diagnosis Date  .  Anxiety   . Arthritis   . CLL (chronic lymphocytic leukemia) ([place])   . Depression    Wellbutrin  . Diabetes mellitus    Type II  . Endometrial ca ([place])    1998  . 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 GERD (gastroesophageal reflux disease)   . Heart murmur    patient states "cardiologist has not heard heart murmur for past several years"  . Heel spur   . 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 Hyperlipidemia   . Hypertension   . Left leg swelling    "swelling in left leg from knee down when sitting for prolonged periods or being on feet for a long period of time"  . PONV (postoperative nausea and vomiting)    after hysterectomy  . Renal insufficiency    Past Surgical History:  Procedure Laterality Date  . ABDOMINAL HYSTERECTOMY  1998  . COLONOSCOPY    . EXCISION MASS UPPER EXTREMETIES Right 02/07/2019   [facility] - right hand dorsal mass excision - Path-squamous cell carcinoma in situ  . EYE SURGERY Bilateral    cataracts  . EYE SURGERY Left    retina tear  . FEMUR IM NAIL  10/25/2012   Procedure: INTRAMEDULLARY (IM) NAIL FEMORAL;  Surgeon: Mauri Pole, MD;  Location: [facility];  Service: Orthopedics;  Laterality: Left;  . HIP SURGERY    . left knee arthroscopy  12/2010  . REFRACTIVE SURGERY    . TOTAL KNEE ARTHROPLASTY  12/20/2011   Procedure: TOTAL KNEE ARTHROPLASTY;  Surgeon: Gearlean Alf, MD;  Location: [facility];  Service: Orthopedics;  Laterality: Left;  Failed attempt at spinal   . TOTAL SHOULDER ARTHROPLASTY Right 08/19/2016   Procedure: RIGHT TOTAL SHOULDER ARTHROPLASTY;  Surgeon: Justice Britain, MD;  Location: [facility];  Service: Orthopedics;  Laterality: Right;   Social History   Social History  . Marital Status: Single    Spouse Name: N/A  . Number of Children: 0   Occupational History  . 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 Smoking status: Never Smoker   . Smokeless tobacco: Never Used  . Alcohol Use: No  . 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 ALPRAZolam (XANAX) 1 MG tablet TAKE 1/2 TO 1 TABLET BY MOUTH TWICE DAILY AS NEEDED FOR ANXIETY 60 tablet 5  . aspirin 81 MG tablet Take 81 mg by mouth daily.    . benazepril-hydrochlorthiazide (LOTENSIN HCT) 20-12.5 MG tablet Take 1 tablet by mouth  daily. 90 tablet 3  . Blood Glucose Monitoring Suppl (FREESTYLE FREEDOM LITE) w/Device KIT Use to check blood sugar twice a day, E11.65 1 each 1  . buPROPion (WELLBUTRIN XL) 300 MG 24 hr tablet Take 1 tablet (300 mg total) by mouth daily. 90 tablet 2  . Cholecalciferol (VITAMIN D3) 2000 units TABS Take 2,000 mcg by mouth daily.    . Cyanocobalamin (B-12) 1000 MCG SUBL Place 1 tablet under the tongue daily.    Marland Kitchen doxazosin (CARDURA) 2 MG tablet TAKE 1 TABLET BY MOUTH DAILY. 30 tablet 11  . Esomeprazole Magnesium (NEXIUM PO) Take 22.5 mg by mouth daily.    Marland Kitchen ezetimibe (ZETIA) 10  MG tablet Take 1 tablet (10 mg total) by mouth daily. 90 tablet 3  . gabapentin (NEURONTIN) 300 MG capsule Take 1 capsule (300 mg total) by mouth at bedtime. 90 capsule 2  . glipiZIDE (GLUCOTROL XL) 2.5 MG 24 hr tablet Take 2.5 MG  By mouth 1x a day before b'fast 90 tablet 3  . glipiZIDE (GLUCOTROL) 5 MG tablet Take 1 tablet (5 mg total) by mouth daily before supper. 90 tablet 3  . glucose blood (FREESTYLE LITE) test strip TEST BLOOD SUGAR TWICE DAILY 200 each 0  . Insulin Glargine (LANTUS SOLOSTAR) 100 UNIT/ML Solostar Pen Inject 10 Units into the skin at bedtime. 5 pen PRN  . iron polysaccharides (NIFEREX) 150 MG capsule Take 150 mg by mouth daily.    Elmore Guise Devices (LANCING DEVICE) MISC Use as advised - Freestyle 1 each 0  . metoprolol succinate (TOPROL-XL) 25 MG 24 hr tablet TAKE 1 TABLET BY MOUTH DAILY. 90 tablet 2  . Multiple Vitamins-Minerals (HM MULTIVITAMIN ADULT GUMMY PO) Take 1 tablet by mouth daily.    . simvastatin (ZOCOR) 40 MG tablet Take 1 tablet (40 mg total) by mouth at bedtime. 90 tablet 0   No current facility-administered medications on file prior to visit.    Allergies  Allergen Reactions  . Amlodipine     Swelling     Family History  Problem Relation Age of Onset  . Cancer Mother        breast cancer  . Cancer Father        plasma sarcoma  . Breast cancer Neg Hx    PE: BP (!) 100/58    Pulse 64   Ht 4' 11.25" (1.505 m) Comment: measured today without shoes  Wt 183 lb (83 kg)   BMI 36.65 kg/m  Body mass index is 36.65 kg/m. Wt Readings from Last 3 Encounters:  07/02/19 183 lb (83 kg)  05/11/19 194 lb 11.2 oz (88.3 kg)  05/02/19 196 lb (88.9 kg)   Constitutional: overweight, in NAD Eyes: PERRLA, EOMI, no exophthalmos ENT: moist mucous membranes, no thyromegaly, no cervical lymphadenopathy Cardiovascular: RRR, No MRG, + LLE edema (chronic) Respiratory: CTA B Gastrointestinal: abdomen soft, NT, ND, BS+ Musculoskeletal: no deformities, strength intact in all 4 Skin: moist, warm, no rashes Neurological: no tremor with outstretched hands, DTR normal in all 4  ASSESSMENT: 1. DM2, non-insulin-dependent, uncontrolled, with complications and hyperglycemia - mild CKD  2. Obesity  3. HL  PLAN:  1. Patient with longstanding, previously uncontrolled diabetes, with much improved control in the last 2 years, initially on Victoza, but now off the medication due to nausea.  At last visit, HbA1c was 6.6%, however, she had another checked by PCP 1 month ago and this increased to 8.1%. -Her kidney function worsened recently and her potassium increase.  PCP stop metformin and start insulin.  She feels better on insulin and her sugars improved.  However, she had 2 low blood sugars in the 50s recently.  At this point, I advised her to stopped glipizide XL and to decrease the dose of glipizide IR with dinner (she is taking 5 mg before each dinner rather than 2.5 to 5 mg). -She is preparing to get steroid injection in her shoulder.  I advised her that she may need to be briefly back on the glipizide XL if the sugars start to increase. -I also advised her to let me know if her sugars remain low after we decrease her glipizide doses so we can  increase the insulin doses, 2. - I suggested to:  Patient Instructions  Please continue: - Lantus 12 units in am  Please stop: - Glipizide  ER  Decrease: - Glipizide 2.5 mg before dinner (with a larger meal)  Please let me know if you continue to have sugars <70.  Please return in 3-4 months with your sugar log.   - we checked her HbA1c: 6.3% (much better) - this was checked at her request - advised to check sugars at different times of the day - 1x a day, rotating check times - advised for yearly eye exams >> she is not UTD - return to clinic in 4 months      2. Obesity -Lost approximately 7 pounds since last visit, despite switching to insulin  3.  Hyperlipidemia - Reviewed latest lipid panel from last month: LDL at goal, as were her triglycerides, but HDL low: Lab Results  Component Value Date   CHOL 109 05/30/2019   HDL 31.90 (L) 05/30/2019   LDLCALC 50 05/30/2019   LDLDIRECT 88.0 03/07/2017   TRIG 134.0 05/30/2019   CHOLHDL 3 05/30/2019  - Continues Zetia and simvastatin without side effects.  Philemon Kingdom, MD PhD [facility]

## 2019-07-04 DIAGNOSIS — M19012 Primary osteoarthritis, left shoulder: Secondary | ICD-10-CM | POA: Diagnosis not present

## 2019-07-12 DIAGNOSIS — M79641 Pain in right hand: Secondary | ICD-10-CM | POA: Diagnosis not present

## 2019-07-18 ENCOUNTER — Telehealth: Payer: Self-pay | Admitting: Family Medicine

## 2019-07-18 NOTE — Telephone Encounter (Signed)
Called patient to schedule AWV, but no answer, will try to call patient back at later time. SF

## 2019-07-30 ENCOUNTER — Encounter: Payer: Self-pay | Admitting: Internal Medicine

## 2019-07-30 MED ORDER — FREESTYLE LITE TEST VI STRP: ORAL_STRIP | 11 refills | Status: DC

## 2019-08-01 ENCOUNTER — Encounter: Payer: Self-pay | Admitting: Internal Medicine

## 2019-08-02 ENCOUNTER — Encounter: Payer: Self-pay | Admitting: Internal Medicine

## 2019-08-02 DIAGNOSIS — M79641 Pain in right hand: Secondary | ICD-10-CM | POA: Diagnosis not present

## 2019-08-02 NOTE — Telephone Encounter (Signed)
[facility] is calling requesting we send the CMN form again for this patient. Complications with there printer.

## 2019-08-06 ENCOUNTER — Other Ambulatory Visit: Payer: Self-pay

## 2019-08-06 MED ORDER — ONETOUCH VERIO VI STRP: ORAL_STRIP | 12 refills | Status: DC

## 2019-08-06 MED ORDER — ONETOUCH VERIO W/DEVICE KIT: PACK | 0 refills | Status: DC

## 2019-08-06 NOTE — Telephone Encounter (Signed)
Patient requests that you call [facility] by phone and find out what exactly is going on.  Thank you!

## 2019-08-06 NOTE — Telephone Encounter (Signed)
[facility] is now saying they have to have a new approval form now from [place] because [facility] will not cover the increase in test strips quantity to 3 times a day.  Do we have samples to carry her over? Please complete the approval.  Pt is out of strips

## 2019-08-14 DIAGNOSIS — D225 Melanocytic nevi of trunk: Secondary | ICD-10-CM | POA: Diagnosis not present

## 2019-08-14 DIAGNOSIS — D1801 Hemangioma of skin and subcutaneous tissue: Secondary | ICD-10-CM | POA: Diagnosis not present

## 2019-08-14 DIAGNOSIS — L309 Dermatitis, unspecified: Secondary | ICD-10-CM | POA: Diagnosis not present

## 2019-08-14 DIAGNOSIS — L814 Other melanin hyperpigmentation: Secondary | ICD-10-CM | POA: Diagnosis not present

## 2019-08-14 DIAGNOSIS — L570 Actinic keratosis: Secondary | ICD-10-CM | POA: Diagnosis not present

## 2019-08-14 DIAGNOSIS — Z85828 Personal history of other malignant neoplasm of skin: Secondary | ICD-10-CM | POA: Diagnosis not present

## 2019-08-14 DIAGNOSIS — L304 Erythema intertrigo: Secondary | ICD-10-CM | POA: Diagnosis not present

## 2019-08-14 DIAGNOSIS — D0461 Carcinoma in situ of skin of right upper limb, including shoulder: Secondary | ICD-10-CM | POA: Diagnosis not present

## 2019-08-20 ENCOUNTER — Other Ambulatory Visit: Payer: Self-pay | Admitting: Family Medicine

## 2019-08-20 ENCOUNTER — Encounter: Payer: Self-pay | Admitting: Family Medicine

## 2019-08-20 DIAGNOSIS — Z1231 Encounter for screening mammogram for malignant neoplasm of breast: Secondary | ICD-10-CM

## 2019-08-20 NOTE — Telephone Encounter (Signed)
Please see message and advise.  Thank you. Should pt start over with Shingrix injections?

## 2019-09-06 ENCOUNTER — Encounter: Payer: Self-pay | Admitting: Internal Medicine

## 2019-09-06 MED ORDER — LANTUS SOLOSTAR 100 UNIT/ML ~~LOC~~ SOPN: 12.0000 [IU] | PEN_INJECTOR | Freq: Every day | SUBCUTANEOUS | 11 refills | Status: DC

## 2019-09-20 ENCOUNTER — Ambulatory Visit
Admission: RE | Admit: 2019-09-20 | Discharge: 2019-09-20 | Disposition: A | Discharge status: Home / Self Care | Payer: Medicare Other | Source: Ambulatory Visit | Attending: Family Medicine | Admitting: Family Medicine

## 2019-09-20 ENCOUNTER — Other Ambulatory Visit: Payer: Self-pay

## 2019-09-20 DIAGNOSIS — Z1231 Encounter for screening mammogram for malignant neoplasm of breast: Secondary | ICD-10-CM

## 2019-10-01 ENCOUNTER — Other Ambulatory Visit: Payer: Self-pay

## 2019-10-01 MED ORDER — DOXAZOSIN MESYLATE 2 MG PO TABS: 2.0000 mg | ORAL_TABLET | Freq: Every day | ORAL | 3 refills | Status: DC

## 2019-10-08 DIAGNOSIS — Z85828 Personal history of other malignant neoplasm of skin: Secondary | ICD-10-CM | POA: Diagnosis not present

## 2019-10-08 DIAGNOSIS — D0461 Carcinoma in situ of skin of right upper limb, including shoulder: Secondary | ICD-10-CM | POA: Diagnosis not present

## 2019-10-08 DIAGNOSIS — L570 Actinic keratosis: Secondary | ICD-10-CM | POA: Diagnosis not present

## 2019-10-10 DIAGNOSIS — M19012 Primary osteoarthritis, left shoulder: Secondary | ICD-10-CM | POA: Diagnosis not present

## 2019-10-11 DIAGNOSIS — M65341 Trigger finger, right ring finger: Secondary | ICD-10-CM | POA: Diagnosis not present

## 2019-10-11 DIAGNOSIS — M79641 Pain in right hand: Secondary | ICD-10-CM | POA: Diagnosis not present

## 2019-10-11 DIAGNOSIS — M65331 Trigger finger, right middle finger: Secondary | ICD-10-CM | POA: Diagnosis not present

## 2019-10-11 DIAGNOSIS — L608 Other nail disorders: Secondary | ICD-10-CM | POA: Diagnosis not present

## 2019-10-12 ENCOUNTER — Telehealth: Payer: Self-pay | Admitting: Cardiovascular Disease

## 2019-10-12 NOTE — Telephone Encounter (Signed)
   Primary Cardiologist:Timothy Rockey Situ, MD  Chart reviewed as part of pre-operative protocol coverage. Because of Brenda [name] past medical history and time since last visit, he/she will require a follow-up visit in order to better assess preoperative cardiovascular risk.  Brenda [name] has an upcoming office visit with her primary cardiologist Dr. Rockey Situ on 10/15/19. Her last office visit was 10/11/2018. As such, her cardiac clearance for LT total shoulder arthroplasty will be addressed at her upcoming visit.   This note will be routed to the requesting surgeon via the [place] fax function to make them aware of the upcoming appointment.   Patient called and left voicemail reminding her of the appointment and informing her that preop clearance would be addressed at that visit.   Loel Dubonnet, NP  10/12/2019, 4:42 PM

## 2019-10-12 NOTE — Progress Notes (Signed)
Patient ID: Brenda [name], female   DOB: 06/04/1951, 68 y.o.   MRN: [id number] Cardiology Office Note  Date:  10/15/2019   ID:  Brenda, [name] 09-14-1951, MRN [id number]  PCP:  Lucille Passy, MD   Chief Complaint  Patient presents with  . office visit    12 mo F/U-surgical clearance for L shoulder replacement; Meds verbally reviewed with patient.    HPI:  Ms Krehbiel is a  68 -year-old woman with history of coronary artery disease, CT coronary calcium score 1031 obesity, diabetes, poorly controlled, hemoglobin A1c previously of 12  Now 6.3 in 06/2019 hyperlipidemia,  with previous stress test and echocardiogram for abnormal EKG,  CLL s/p shoulder replacement surgery who presents for routine followup of her coronary artery disease and hyperlipidemia.  doing well No problems with covid  Needs left shoulder replacement Probably in Feb 2021  nonsmoker Retired  Stress concernign enlarged lymph nodes in axilla, Possibly from [place] reviewed HBA1C 6.3 LDL 50  EKG personally reviewed by myself on todays visit Shows nsr with rate 71 bpm, no significant ST or T wave changes  Other past medical history reviewed In the hospital 11/2017 Hospital records reviewed with the patient in detail Dehydrated Lung nodules, enlarged lymph nodes Anemic  HBG 6.6 after hydration, iron def, stool negative BP was low. Acute renal failure  CT coronary calcium score showing diffuse LAD disease, RCA disease Dramatically less left circumflex disease   PMH:   has a past medical history of Anxiety, Arthritis, CLL (chronic lymphocytic leukemia) ([place]), Depression, Diabetes mellitus, Endometrial ca [facility]), Foot fracture, left, GERD (gastroesophageal reflux disease), Heart murmur, Heel spur, History of squamous cell carcinoma in situ (02/19/2019), Hyperlipidemia, Hypertension, Left leg swelling, PONV (postoperative nausea and vomiting), and Renal insufficiency.  PSH:    Past Surgical  History:  Procedure Laterality Date  . ABDOMINAL HYSTERECTOMY  1998  . BREAST BIOPSY Left 01/03/2019   axillary ULS bx   . COLONOSCOPY    . EXCISION MASS UPPER EXTREMETIES Right 02/07/2019   [facility] - right hand dorsal mass excision - Path-squamous cell carcinoma in situ  . EYE SURGERY Bilateral    cataracts  . EYE SURGERY Left    retina tear  . FEMUR IM NAIL  10/25/2012   Procedure: INTRAMEDULLARY (IM) NAIL FEMORAL;  Surgeon: Mauri Pole, MD;  [facility];  Service: Orthopedics;  Laterality: Left;  . HIP SURGERY    . left knee arthroscopy  12/2010  . REFRACTIVE SURGERY    . TOTAL KNEE ARTHROPLASTY  12/20/2011   Procedure: TOTAL KNEE ARTHROPLASTY;  Surgeon: Gearlean Alf, MD;  Location[facility];  Service: Orthopedics;  Laterality: Left;  Failed attempt at spinal   . TOTAL SHOULDER ARTHROPLASTY Right 08/19/2016   Procedure: RIGHT TOTAL SHOULDER ARTHROPLASTY;  Surgeon: Justice Britain, MD;  Location: [facility];  Service: Orthopedics;  Laterality: Right;    Current Outpatient Medications  Medication Sig Dispense Refill  . ALPRAZolam (XANAX) 1 MG tablet TAKE 1/2 TO 1 TABLET BY MOUTH TWICE DAILY AS NEEDED FOR ANXIETY 60 tablet 5  . aspirin 81 MG tablet Take 81 mg by mouth daily.    . benazepril-hydrochlorthiazide (LOTENSIN HCT) 20-12.5 MG tablet Take 1 tablet by mouth daily. 90 tablet 3  . Blood Glucose Monitoring Suppl (ONETOUCH VERIO) w/Device KIT Use to check blood sugar 3 times a day 1 kit 0  . Cholecalciferol (VITAMIN D3) 2000 units TABS Take 2,000 mcg by mouth daily.    Marland Kitchen  Cyanocobalamin (B-12) 1000 MCG SUBL Place 1 tablet under the tongue daily.    Marland Kitchen doxazosin (CARDURA) 2 MG tablet Take 1 tablet (2 mg total) by mouth daily. 30 tablet 3  . Esomeprazole Magnesium (NEXIUM PO) Take 22.5 mg by mouth daily.    Marland Kitchen gabapentin (NEURONTIN) 300 MG capsule Take 1 capsule (300 mg total) by mouth at bedtime. 90 capsule 2  . glipiZIDE (GLUCOTROL) 5 MG tablet Take 0.5-1 tablets (2.5-5 mg  total) by mouth daily before supper. 90 tablet 3  . glucose blood (ONETOUCH VERIO) test strip Use to check blood sugar 3 times a day. 300 each 12  . Insulin Glargine (LANTUS SOLOSTAR) 100 UNIT/ML Solostar Pen Inject 12 Units into the skin daily. 5 pen 11  . Insulin Pen Needle 32G X 4 MM MISC Use 1x a day 100 each 3  . iron polysaccharides (NIFEREX) 150 MG capsule Take 150 mg by mouth daily.    Elmore Guise Devices (LANCING DEVICE) MISC Use as advised - Freestyle 1 each 0  . Lancets (FREESTYLE) lancets Use as instructed 3x a day 200 each 5  . metoprolol succinate (TOPROL-XL) 25 MG 24 hr tablet TAKE 1 TABLET BY MOUTH DAILY. 90 tablet 2  . Multiple Vitamins-Minerals (HM MULTIVITAMIN ADULT GUMMY PO) Take 1 tablet by mouth daily.    . simvastatin (ZOCOR) 40 MG tablet Take 1 tablet (40 mg total) by mouth at bedtime. 90 tablet 0  . traMADol (ULTRAM) 50 MG tablet Take 50 mg by mouth at bedtime.    Marland Kitchen ezetimibe (ZETIA) 10 MG tablet Take 1 tablet (10 mg total) by mouth daily. 90 tablet 3   No current facility-administered medications for this visit.    Allergies:   Amlodipine   Social History:  The patient  reports that she has never smoked. She has never used smokeless tobacco. She reports that she does not drink alcohol or use drugs.   Family History:   family history includes Cancer in her father and mother.    Review of Systems: Review of Systems  Constitutional: Negative.   HENT: Negative.   Respiratory: Negative.   Cardiovascular: Positive for leg swelling.  Gastrointestinal: Negative.   Musculoskeletal: Negative.   Neurological: Negative.   Psychiatric/Behavioral: Negative.   All other systems reviewed and are negative.    PHYSICAL EXAM: VS:  BP 110/70 (BP Location: Left Arm, Patient Position: Sitting, Cuff Size: Normal)   Pulse 71   Temp (!) 96.7 F (35.9 C)   Ht 5' (1.524 m)   Wt 190 lb 4 oz (86.3 kg)   SpO2 96%   BMI 37.16 kg/m  , BMI Body mass index is 37.16  kg/m. Constitutional:  oriented to person, place, and time. No distress.  HENT:  Head: Grossly normal Eyes:  no discharge. No scleral icterus.  Neck: No JVD, no carotid bruits  Cardiovascular: Regular rate and rhythm, no murmurs appreciated Pulmonary/Chest: Clear to auscultation bilaterally, no wheezes or rails Abdominal: Soft.  no distension.  no tenderness.  Musculoskeletal: Normal range of motion Neurological:  normal muscle tone. Coordination normal. No atrophy Skin: Skin warm and dry Psychiatric: normal affect, pleasant  Recent Labs: 05/11/2019: Hemoglobin 12.3; Platelets 167 05/30/2019: ALT 19; BUN 35; Creatinine, Ser 1.46; Potassium 4.5; Sodium 135    Lipid Panel Lab Results  Component Value Date   CHOL 109 05/30/2019   HDL 31.90 (L) 05/30/2019   LDLCALC 50 05/30/2019   TRIG 134.0 05/30/2019      Wt Readings from  Last 3 Encounters:  10/15/19 190 lb 4 oz (86.3 kg)  07/02/19 183 lb (83 kg)  05/11/19 194 lb 11.2 oz (88.3 kg)      ASSESSMENT AND PLAN:  Essential hypertension -  Blood pressure is well controlled on today's visit. No changes made to the medications.  Tachycardia -  No sx Stable on metoprolol  Type 2 diabetes mellitus with hyperglycemia, without long-term current use of insulin ([place]) Working with endocrine, HBA1C stable limited by orthopedic issues  Obesity Recommended water aerobics, Low carbs We have encouraged continued exercise, careful diet management in an effort to lose weight.  HLD (hyperlipidemia) Tolerating simvastatin, zetia Numbers good goodrx for zetia  Anemia Iron deficiency stable  Disposition:   F/U  12 months   Total encounter time more than 25 minutes  Greater than 50% was spent in counseling and coordination of care with the patient    Orders Placed This Encounter  Procedures  . EKG 12-Lead   Signed, Esmond Plants, M.D., Ph.D. 10/15/2019  [place], [place]

## 2019-10-12 NOTE — Telephone Encounter (Signed)
   [facility] Pre-operative Risk Assessment    Request for surgical clearance:  1. What type of surgery is being performed? LT total shoulder arthroplasty   2. When is this surgery scheduled? TBD  3. What type of clearance is required (medical clearance vs. Pharmacy clearance to hold med vs. Both)? Medical   4. Are there any medications that need to be held prior to surgery and how long? Not listed, please advise if any medication instructions needed    5. Practice name and name of physician performing surgery? [facility], Dr Lennette Bihari Supple   6. What is your office phone number [phone]    7.   What is your office fax number [phone]- ATTN: [place]  8.   Anesthesia type (None, local, MAC, general) ? General    Caryl Pina Gerringer 10/12/2019, 4:07 PM  _________________________________________________________________   (provider comments below)

## 2019-10-15 ENCOUNTER — Encounter: Payer: Self-pay | Admitting: Cardiovascular Disease

## 2019-10-15 ENCOUNTER — Other Ambulatory Visit: Payer: Self-pay

## 2019-10-15 ENCOUNTER — Ambulatory Visit (INDEPENDENT_AMBULATORY_CARE_PROVIDER_SITE_OTHER): Payer: Medicare Other | Admitting: Cardiovascular Disease

## 2019-10-15 VITALS — BP 110/70 | HR 71 | Temp 96.7°F | Ht 60.0 in | Wt 190.2 lb

## 2019-10-15 DIAGNOSIS — I25118 Atherosclerotic heart disease of native coronary artery with other forms of angina pectoris: Secondary | ICD-10-CM | POA: Diagnosis not present

## 2019-10-15 DIAGNOSIS — E782 Mixed hyperlipidemia: Secondary | ICD-10-CM

## 2019-10-15 DIAGNOSIS — N189 Chronic kidney disease, unspecified: Secondary | ICD-10-CM

## 2019-10-15 DIAGNOSIS — N179 Acute kidney failure, unspecified: Secondary | ICD-10-CM

## 2019-10-15 DIAGNOSIS — C9110 Chronic lymphocytic leukemia of B-cell type not having achieved remission: Secondary | ICD-10-CM

## 2019-10-15 DIAGNOSIS — E1165 Type 2 diabetes mellitus with hyperglycemia: Secondary | ICD-10-CM

## 2019-10-15 DIAGNOSIS — I10 Essential (primary) hypertension: Secondary | ICD-10-CM

## 2019-10-15 MED ORDER — EZETIMIBE 10 MG PO TABS: 10.0000 mg | ORAL_TABLET | Freq: Every day | ORAL | 3 refills | Status: DC

## 2019-10-15 NOTE — Patient Instructions (Signed)

## 2019-10-17 NOTE — Telephone Encounter (Signed)
Acceptable risk for procedure Was seen in clinic, No further testing needed

## 2019-10-17 NOTE — Telephone Encounter (Signed)
   Primary Cardiologist: Ida Rogue, MD  Chart reviewed as part of pre-operative protocol coverage. Given past medical history and time since last visit, based on ACC/AHA guidelines, Brenda [name] would be at acceptable risk for the planned procedure without further cardiovascular testing.   Patient seen by Dr. Rockey Situ on 10/15/2019 for routine follow up and pre-operative clearance. Per Dr. Rockey Situ, she is at acceptable risk to proceed with surgery at this time. See Epic note from 10/15/2019.   I will route this recommendation to the requesting party via Epic fax function and remove from pre-op pool.  Please call with questions.  Kathyrn Drown, NP 10/17/2019, 2:59 PM

## 2019-11-05 ENCOUNTER — Other Ambulatory Visit: Payer: Self-pay

## 2019-11-06 ENCOUNTER — Encounter: Payer: Self-pay | Admitting: Internal Medicine

## 2019-11-06 ENCOUNTER — Ambulatory Visit (INDEPENDENT_AMBULATORY_CARE_PROVIDER_SITE_OTHER): Payer: Medicare Other | Admitting: Internal Medicine

## 2019-11-06 VITALS — BP 128/80 | HR 70 | Ht 59.0 in | Wt 188.0 lb

## 2019-11-06 DIAGNOSIS — E782 Mixed hyperlipidemia: Secondary | ICD-10-CM

## 2019-11-06 DIAGNOSIS — Z6835 Body mass index (BMI) 35.0-35.9, adult: Secondary | ICD-10-CM

## 2019-11-06 DIAGNOSIS — E1165 Type 2 diabetes mellitus with hyperglycemia: Secondary | ICD-10-CM

## 2019-11-06 LAB — POCT GLYCOSYLATED HEMOGLOBIN (HGB A1C): Hemoglobin A1C: 6.9 % — AB (ref 4.0–5.6)

## 2019-11-06 MED ORDER — ONETOUCH ULTRASOFT LANCETS MISC: 12 refills | Status: DC

## 2019-11-06 NOTE — Progress Notes (Signed)
Patient ID: Brenda [name], female   DOB: May 02, 1951, 69 y.o.   MRN: [id number]  This visit occurred during the SARS-CoV-2 public health emergency.  Safety protocols were in place, including screening questions prior to the visit, additional usage of staff PPE, and extensive cleaning of exam room while observing appropriate contact time as indicated for disinfecting solutions.   HPI: Brenda [name] is a 69 y.o.-year-old female, presenting for follow-up for DM2, dx in 2007, insulin-dependent, uncontrolled, with complications (CKD). Last visit 4 months ago.  In 05/2019, due to worsening kidney function at her visit with PCP, Metformin was stopped and insulin was started.  Her sugars improved afterwards.  However, they worsened slightly over the holidays.  Reviewed HbA1c levels: Lab Results  Component Value Date   HGBA1C 6.3 (A) 07/02/2019   HGBA1C 8.1 (H) 05/25/2019   HGBA1C 6.6 (A) 09/27/2018   Patient is on: - Lantus 12 units in a.m.- started 05/2019 by PCP  - Glipizide 2.5 >> 5 mg before a large dinner She was on Glipizide in 11/2015 after a steroid inj. We stopped Victoza due to nausea. Metformin ER was stopped by PCP due to CKD. Glipizide ER was stopped 06/2019 due to low blood sugars.   Pt checks her sugars 2x a day: - am: 98-150, 278 (ave 120-130s) >> 52, 66-134, 154, 178, 183 >> 119, 128-198 - 2h after b'fast: 156 >> n/c >> 148 >> n/c - before lunch: 69-87 >> 74-119 >> n/c >> 75-115 >> 80-169, 197 - 2h after lunch: 118, 123 >> 92, 99 >> n/c - before dinner: 6 90-138, 141 >> 110-130 >> 60-114 >> 74-149, 151 - 2h after dinner:n/c >> 138, 148 >> n/c - bedtime: 74, 80-170 >> 91-143 >> n/c - nighttime: n/c Lowest sugar was 37 x1 >> ... >> 94 >> 98 >> 52 >> 74; she has hypoglycemia awareness in the 70s. Highest sugar was 190 >> 278 (steroids) >> 184 >> 198.  Glucometer: Freestyle Lite  Pt's meals are: - Breakfast: English muffin + preserve or cereal or yoghurt - snack:  fruit or fruit + yoghurt - Lunch: 1/2 sandwich + chips + salsa + fruit/sugar free - Dinner: soup or chicken/steak + veggies, occasional starch or Lean cuisine or light salad She does not drink soft drinks.  -+ CKD, last BUN/creatinine:  Lab Results  Component Value Date   BUN 35 (H) 05/30/2019   CREATININE 1.46 (H) 05/30/2019  On benazepril. She had a high potassium (5.9) >> used Kayexalate x3 >> latest K 4.5. -+ HL; last set of lipids: Lab Results  Component Value Date   CHOL 109 05/30/2019   HDL 31.90 (L) 05/30/2019   LDLCALC 50 05/30/2019   LDLDIRECT 88.0 03/07/2017   TRIG 134.0 05/30/2019   CHOLHDL 3 05/30/2019  On simvastatin and Zetia. - last eye exam 03/2019: No DR; she had cataract surgery in 2016.  She has a history of retinal tear. -She denies numbness and tingling in her feet.  She is on Neurontin for leg cramps at night  She also has HTN, GERD, depression.  She was admitted 11/2017 for dehydration, anemia, and acute renal failure >> diagnosed with CLL.  ROS: Constitutional: + weight gain/no weight loss, no fatigue, no subjective hyperthermia, no subjective hypothermia Eyes: no blurry vision, no xerophthalmia ENT: no sore throat, no nodules palpated in neck, no dysphagia, no odynophagia, no hoarseness Cardiovascular: no CP/no SOB/no palpitations/no leg swelling Respiratory: no cough/no SOB/no wheezing Gastrointestinal: no N/no V/no D/no C/no  acid reflux Musculoskeletal: no muscle aches/no joint aches Skin: no rashes, no hair loss Neurological: no tremors/no numbness/no tingling/no dizziness  I reviewed pt's medications, allergies, PMH, social hx, family hx, and changes were documented in the history of present illness. Otherwise, unchanged from my initial visit note.  Past Medical History:  Diagnosis Date  . Anxiety   . Arthritis   . CLL (chronic lymphocytic leukemia) ([place])   . Depression    Wellbutrin  . Diabetes mellitus    Type II  . Endometrial ca  ([place])    1998  . 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 GERD (gastroesophageal reflux disease)   . Heart murmur    patient states "cardiologist has not heard heart murmur for past several years"  . Heel spur   . 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 Hyperlipidemia   . Hypertension   . Left leg swelling    "swelling in left leg from knee down when sitting for prolonged periods or being on feet for a long period of time"  . PONV (postoperative nausea and vomiting)    after hysterectomy  . Renal insufficiency    Past Surgical History:  Procedure Laterality Date  . ABDOMINAL HYSTERECTOMY  1998  . BREAST BIOPSY Left 01/03/2019   axillary ULS bx   . COLONOSCOPY    . EXCISION MASS UPPER EXTREMETIES Right 02/07/2019   [facility] - right hand dorsal mass excision - Path-squamous cell carcinoma in situ  . EYE SURGERY Bilateral    cataracts  . EYE SURGERY Left    retina tear  . FEMUR IM NAIL  10/25/2012   Procedure: INTRAMEDULLARY (IM) NAIL FEMORAL;  Surgeon: Mauri Pole, MD;  Location: [facility];  Service: Orthopedics;  Laterality: Left;  . HIP SURGERY    . left knee arthroscopy  12/2010  . REFRACTIVE SURGERY    . TOTAL KNEE ARTHROPLASTY  12/20/2011   Procedure: TOTAL KNEE ARTHROPLASTY;  Surgeon: Gearlean Alf, MD;  Location: [facility];  Service: Orthopedics;  Laterality: Left;  Failed attempt at spinal   . TOTAL SHOULDER ARTHROPLASTY Right 08/19/2016   Procedure: RIGHT TOTAL SHOULDER ARTHROPLASTY;  Surgeon: Justice Britain, MD;  Location: [place];  Service: Orthopedics;  Laterality: Right;   Social History   Social History  . Marital Status: Single    Spouse Name: N/A  . Number of Children: 0   Occupational History  . 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 Smoking status: Never Smoker   . Smokeless tobacco: Never Used  . Alcohol Use: No   . 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 ALPRAZolam (XANAX) 1 MG tablet TAKE 1/2 TO 1 TABLET BY MOUTH TWICE DAILY AS NEEDED FOR ANXIETY 60 tablet 5  . aspirin 81 MG tablet Take 81 mg by mouth daily.    . benazepril-hydrochlorthiazide (LOTENSIN HCT) 20-12.5 MG tablet Take 1 tablet by mouth daily. 90 tablet 3  . Blood Glucose Monitoring Suppl (ONETOUCH VERIO) w/Device KIT Use to check blood sugar 3 times a day 1 kit 0  . Cholecalciferol (VITAMIN D3) 2000 units TABS Take 2,000 mcg by mouth daily.    . Cyanocobalamin (B-12) 1000  MCG SUBL Place 1 tablet under the tongue daily.    Marland Kitchen doxazosin (CARDURA) 2 MG tablet Take 1 tablet (2 mg total) by mouth daily. 30 tablet 3  . Esomeprazole Magnesium (NEXIUM PO) Take 22.5 mg by mouth daily.    Marland Kitchen ezetimibe (ZETIA) 10 MG tablet Take 1 tablet (10 mg total) by mouth daily. 90 tablet 3  . gabapentin (NEURONTIN) 300 MG capsule Take 1 capsule (300 mg total) by mouth at bedtime. 90 capsule 2  . glipiZIDE (GLUCOTROL) 5 MG tablet Take 0.5-1 tablets (2.5-5 mg total) by mouth daily before supper. 90 tablet 3  . glucose blood (ONETOUCH VERIO) test strip Use to check blood sugar 3 times a day. 300 each 12  . Insulin Glargine (LANTUS SOLOSTAR) 100 UNIT/ML Solostar Pen Inject 12 Units into the skin daily. 5 pen 11  . Insulin Pen Needle 32G X 4 MM MISC Use 1x a day 100 each 3  . iron polysaccharides (NIFEREX) 150 MG capsule Take 150 mg by mouth daily.    Elmore Guise Devices (LANCING DEVICE) MISC Use as advised - Freestyle 1 each 0  . Lancets (FREESTYLE) lancets Use as instructed 3x a day 200 each 5  . metoprolol succinate (TOPROL-XL) 25 MG 24 hr tablet TAKE 1 TABLET BY MOUTH DAILY. 90 tablet 2  . Multiple Vitamins-Minerals (HM MULTIVITAMIN ADULT GUMMY PO) Take 1 tablet by mouth daily.    . simvastatin (ZOCOR) 40 MG tablet Take 1 tablet (40 mg total) by mouth  at bedtime. 90 tablet 0  . traMADol (ULTRAM) 50 MG tablet Take 50 mg by mouth at bedtime.     No current facility-administered medications on file prior to visit.   Allergies  Allergen Reactions  . Amlodipine     Swelling     Family History  Problem Relation Age of Onset  . Cancer Mother        breast cancer  . Cancer Father        plasma sarcoma  . Breast cancer Neg Hx    PE: BP 128/80   Pulse 70   Ht 4' 11" (1.499 m) Comment: measured today without shoes  Wt 188 lb (85.3 kg)   SpO2 97%   BMI 37.97 kg/m  Body mass index is 37.97 kg/m. Wt Readings from Last 3 Encounters:  11/06/19 188 lb (85.3 kg)  10/15/19 190 lb 4 oz (86.3 kg)  07/02/19 183 lb (83 kg)   Constitutional: overweight, in NAD Eyes: PERRLA, EOMI, no exophthalmos ENT: moist mucous membranes, no thyromegaly, no cervical lymphadenopathy Cardiovascular: RRR, No MRG, + LLE edema -chronic Respiratory: CTA B Gastrointestinal: abdomen soft, NT, ND, BS+ Musculoskeletal: no deformities, strength intact in all 4 Skin: moist, warm, no rashes Neurological: no tremor with outstretched hands, DTR normal in all 4  ASSESSMENT: 1. DM2, now insulin-dependent, uncontrolled, with complications  - mild CKD  2. Obesity class 2  3. HL  PLAN:  1. Patient with longstanding, previously uncontrolled diabetes, with much improved control since summer 2020 due to addition of insulin.  At that time, Metformin had to be stopped due to worsening kidney function.  Her HbA1c decreased from 8.1% to 6.3%.  At last visit, she was having low blood sugars in the 50s so we stopped glipizide ER and we decreased the dose of her glipizide IR to only 2.5 mg before a large meal. -Of note, in the past, she was on Victoza, but this was stopped due to nausea.  If we need to  restart a GLP-1 receptor agonist, we may need to use a very low dose. -At this visit, sugars are slightly worse over the holidays, with higher sugars being in the morning, up to  180s and even rarely 190s.  Sugars improved later in the day.  I suspect that this is either due to increased insulin resistance due to decreased activity or the fact that she takes Lantus in the morning.  We discussed about staying more active now after the holidays, but we also discussed about moving Lantus in the evening.  She is taking a higher dose of glipizide and suggested, 5 mg instead of 2.5 mg with a larger meal and I advised her that she may need to decrease the dose if she sees low blood sugars after dinner.  For now, we will not make other changes. - I suggested to:  Patient Instructions  Please continue: - Lantus 12 units but move this after dinner  Continue: - Glipizide 5 mg before a larger dinner (may need 2.5 mg)  Please return in 3-4 months with your sugar log.  - we checked her HbA1c: 6.9% (higher) - advised to check sugars at different times of the day - 1x a day, rotating check times - advised for yearly eye exams >> she is UTD - I filled out a form for surgical clearance for her -she will have shoulder replacement surgery soon - return to clinic in 3-4 months     2. Obesity class 2 -Before last visit, she lost 7 pounds despite starting insulin. Now gained 5 lbs since last OV -At last visit, we are able to stop the glipizide ER and decrease the regular glipizide, which should also help with weight loss  3.  Hyperlipidemia -Reviewed latest lipid panel from 05/2019: LDL excellent, at goal, HDL slightly low, triglycerides at goal Lab Results  Component Value Date   CHOL 109 05/30/2019   HDL 31.90 (L) 05/30/2019   LDLCALC 50 05/30/2019   LDLDIRECT 88.0 03/07/2017   TRIG 134.0 05/30/2019   CHOLHDL 3 05/30/2019  -She continues on simvastatin and Zetia without side effects  Philemon Kingdom, MD PhD [facility] Endocrinology

## 2019-11-06 NOTE — Patient Instructions (Signed)
Please continue: - Lantus 12 units but move this after dinner  Continue: - Glipizide 5 mg before a larger dinner (may need 2.5 mg)  Please return in 3-4 months with your sugar log.

## 2019-11-07 MED ORDER — ONETOUCH DELICA LANCETS 30G MISC: 1.0000 | Freq: Three times a day (TID) | 11 refills | Status: DC

## 2019-11-09 ENCOUNTER — Other Ambulatory Visit: Payer: Self-pay | Admitting: *Deleted

## 2019-11-09 ENCOUNTER — Encounter: Payer: Self-pay | Admitting: Internal Medicine

## 2019-11-09 DIAGNOSIS — C9110 Chronic lymphocytic leukemia of B-cell type not having achieved remission: Secondary | ICD-10-CM

## 2019-11-09 NOTE — Telephone Encounter (Signed)
Pharmacy spoke to me.  They will refill BID lancets and get CMN on Monday.

## 2019-11-12 ENCOUNTER — Inpatient Hospital Stay: Payer: Medicare Other | Attending: Hematology

## 2019-11-12 ENCOUNTER — Other Ambulatory Visit: Payer: Self-pay

## 2019-11-12 ENCOUNTER — Inpatient Hospital Stay (HOSPITAL_BASED_OUTPATIENT_CLINIC_OR_DEPARTMENT_OTHER): Payer: Medicare Other | Admitting: Hematology

## 2019-11-12 ENCOUNTER — Telehealth: Payer: Self-pay | Admitting: Hematology

## 2019-11-12 VITALS — BP 127/64 | HR 85 | Temp 98.0°F | Resp 18 | Ht 59.0 in | Wt 188.0 lb

## 2019-11-12 DIAGNOSIS — D509 Iron deficiency anemia, unspecified: Secondary | ICD-10-CM | POA: Insufficient documentation

## 2019-11-12 DIAGNOSIS — Z79899 Other long term (current) drug therapy: Secondary | ICD-10-CM | POA: Diagnosis not present

## 2019-11-12 DIAGNOSIS — Z7982 Long term (current) use of aspirin: Secondary | ICD-10-CM | POA: Diagnosis not present

## 2019-11-12 DIAGNOSIS — Z90710 Acquired absence of both cervix and uterus: Secondary | ICD-10-CM | POA: Diagnosis not present

## 2019-11-12 DIAGNOSIS — R918 Other nonspecific abnormal finding of lung field: Secondary | ICD-10-CM | POA: Diagnosis not present

## 2019-11-12 DIAGNOSIS — I10 Essential (primary) hypertension: Secondary | ICD-10-CM | POA: Diagnosis not present

## 2019-11-12 DIAGNOSIS — Z8542 Personal history of malignant neoplasm of other parts of uterus: Secondary | ICD-10-CM | POA: Diagnosis not present

## 2019-11-12 DIAGNOSIS — Z794 Long term (current) use of insulin: Secondary | ICD-10-CM | POA: Diagnosis not present

## 2019-11-12 DIAGNOSIS — C9110 Chronic lymphocytic leukemia of B-cell type not having achieved remission: Secondary | ICD-10-CM | POA: Diagnosis not present

## 2019-11-12 DIAGNOSIS — E119 Type 2 diabetes mellitus without complications: Secondary | ICD-10-CM | POA: Diagnosis not present

## 2019-11-12 DIAGNOSIS — Z7984 Long term (current) use of oral hypoglycemic drugs: Secondary | ICD-10-CM | POA: Insufficient documentation

## 2019-11-12 LAB — CMP (CANCER CENTER ONLY)
ALT: 13 U/L (ref 0–44)
AST: 14 U/L — ABNORMAL LOW (ref 15–41)
Albumin: 4.7 g/dL (ref 3.5–5.0)
Alkaline Phosphatase: 98 U/L (ref 38–126)
Anion gap: 10 (ref 5–15)
BUN: 37 mg/dL — ABNORMAL HIGH (ref 8–23)
CO2: 25 mmol/L (ref 22–32)
Calcium: 9.7 mg/dL (ref 8.9–10.3)
Chloride: 104 mmol/L (ref 98–111)
Creatinine: 1.43 mg/dL — ABNORMAL HIGH (ref 0.44–1.00)
GFR, Est AFR Am: 43 mL/min — ABNORMAL LOW (ref >60)
GFR, Estimated: 37 mL/min — ABNORMAL LOW (ref >60)
Glucose, Bld: 122 mg/dL — ABNORMAL HIGH (ref 70–99)
Potassium: 5.1 mmol/L (ref 3.5–5.1)
Sodium: 139 mmol/L (ref 135–145)
Total Bilirubin: 0.6 mg/dL (ref 0.3–1.2)
Total Protein: 7 g/dL (ref 6.5–8.1)

## 2019-11-12 LAB — CBC WITH DIFFERENTIAL (CANCER CENTER ONLY)
Abs Immature Granulocytes: 0.02 10*3/uL (ref 0.00–0.07)
Basophils Absolute: 0.1 10*3/uL (ref 0.0–0.1)
Basophils Relative: 1 %
Eosinophils Absolute: 0.2 10*3/uL (ref 0.0–0.5)
Eosinophils Relative: 2 %
HCT: 41.7 % (ref 36.0–46.0)
Hemoglobin: 13.3 g/dL (ref 12.0–15.0)
Immature Granulocytes: 0 %
Lymphocytes Relative: 49 %
Lymphs Abs: 4.3 10*3/uL — ABNORMAL HIGH (ref 0.7–4.0)
MCH: 28.5 pg (ref 26.0–34.0)
MCHC: 31.9 g/dL (ref 30.0–36.0)
MCV: 89.3 fL (ref 80.0–100.0)
Monocytes Absolute: 0.6 10*3/uL (ref 0.1–1.0)
Monocytes Relative: 6 %
Neutro Abs: 3.7 10*3/uL (ref 1.7–7.7)
Neutrophils Relative %: 42 %
Platelet Count: 156 10*3/uL (ref 150–400)
RBC: 4.67 MIL/uL (ref 3.87–5.11)
RDW: 12.3 % (ref 11.5–15.5)
WBC Count: 8.8 10*3/uL (ref 4.0–10.5)
nRBC: 0 % (ref 0.0–0.2)

## 2019-11-12 LAB — FERRITIN: Ferritin: 78 ng/mL (ref 11–307)

## 2019-11-12 LAB — LACTATE DEHYDROGENASE: LDH: 196 U/L — ABNORMAL HIGH (ref 98–192)

## 2019-11-12 NOTE — Progress Notes (Signed)
HEMATOLOGY/ONCOLOGY CLINIC NOTE  Date of Service: 11/12/19    Patient Care Team: Brenda Passy, MD as PCP - General Rockey Situ Brenda November, MD as PCP - Cardiology (Cardiology) Minna Merritts, MD as Consulting Physician (Cardiology)  CHIEF COMPLAINTS/PURPOSE OF CONSULTATION:  F/u for continue mx of CLL  HISTORY OF PRESENTING ILLNESS:   Brenda [name] is a wonderful 69 y.o. female who was hospitalized on 11/17/17 for abdominal discomfort, nausea and anorexia.   Further workup showed elevated kidney function tests and CT of the abd and pelvis demonstrated pelvic lymphadenopathy and innumerable pulmonary nodulesin the bases concerning for metastases.  Today she is here for an outpatient follow up accompanied by her friend. She notes she has not felt her usual self in the past 2 months with fatigue and lack of appetite. She also noticed diarrhea that comes and goes and her energy level would continue to drop. She has lost 25-30 pounds since end of summer 2018. She will notice mucous with her stool but no bloody or black stool. She does not take any meds that she would associate with her diarrhea or lower appetite and she denies change in her chronic medications. She has been on Nexium for a long period of time. She notes she does not often get sick. Dr. Deborra [name] is her PCP. She was using 263m ibuprofen 2-3 times a day more often recently due to her hip pain.   She notes her mother had breast cancer and diagnosed at 555yo. Her father had a rare form of cancer who died 3 weeks after diagnosis in his 840s Her brother passed from non-alcohol liver cirrhosis.   On review of symptoms, pt notes since her discharge she no longer has abdominal pain but will still have occasional diarrhea. She has not had diarrhea in several days. She notes to having a slight cough but mostly sinus drainage. Her appetite and eating has improved. She notes due to her knee replacement and hip fracture she will have swelling  in her left leg. She has lower back pain at times. She denies feeling lumps or bumps in her axilla b/l or groin. She denies dysuria or change in her urination.   Interval History: CERNIE SAGREROreturns today regarding her Chronic Lymphocytic Leukemia. The patient's last visit with [facility] on 05/11/2019. The pt reports that she is doing well overall.  The pt reports that she needs to have her left shoulder replaced and has already had her right shoulder replaced. The surgery has been pushed back due to the pandemic. It bothers her the most at night, which then leads to poor sleep quality. She has been taking Tylenol and one Tramadol at night to help her sleep. 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 She was then given 5-fluorouracil cream. Pt has been stable otherwise. She has an upcoming appointment with a Nephrologist.   Lab results today (11/12/19) of CBC w/diff and CMP is as follows: all values are WNL except for Lymphs Abs at 4.3K, Glucose at 122, BUN at 37, Creatinine at 1.43, AST at 14, GFR Est Non Af Am at 37. 11/12/2019 LDH at 196 11/12/2019 Ferritin at 78  On review of systems, pt reports left shoulder pain, sleeplessness and denies SOB, abdominal pain, new rashes, bowel movement changes, fevers, chills, night sweats and any other symptoms.    MEDICAL HISTORY:  Past Medical History:  Diagnosis Date  . Anxiety   .  Arthritis   . CLL (chronic lymphocytic leukemia) ([place])   . Depression    Wellbutrin  . Diabetes mellitus    Type II  . Endometrial ca ([place])    1998  . 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 GERD (gastroesophageal reflux disease)   . Heart murmur    patient states "cardiologist has not heard heart murmur for past several years"  . Heel spur   . 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  Hyperlipidemia   . Hypertension   . Left leg swelling    "swelling in left leg from knee down when sitting for prolonged periods or being on feet for a long period of time"  . PONV (postoperative nausea and vomiting)    after hysterectomy  . Renal insufficiency     SURGICAL HISTORY: Past Surgical History:  Procedure Laterality Date  . ABDOMINAL HYSTERECTOMY  1998  . BREAST BIOPSY Left 01/03/2019   axillary ULS bx   . COLONOSCOPY    . EXCISION MASS UPPER EXTREMETIES Right 02/07/2019   [facility] - right hand dorsal mass excision - Path-squamous cell carcinoma in situ  . EYE SURGERY Bilateral    cataracts  . EYE SURGERY Left    retina tear  . FEMUR IM NAIL  10/25/2012   Procedure: INTRAMEDULLARY (IM) NAIL FEMORAL;  Surgeon: Mauri Pole, MD;  Location: [facility];  Service: Orthopedics;  Laterality: Left;  . HIP SURGERY    . left knee arthroscopy  12/2010  . REFRACTIVE SURGERY    . TOTAL KNEE ARTHROPLASTY  12/20/2011   Procedure: TOTAL KNEE ARTHROPLASTY;  Surgeon: Gearlean Alf, MD;  Location: [facility];  Service: Orthopedics;  Laterality: Left;  Failed attempt at spinal   . TOTAL SHOULDER ARTHROPLASTY Right 08/19/2016   Procedure: RIGHT TOTAL SHOULDER ARTHROPLASTY;  Surgeon: Justice Britain, MD;  Location: [facility];  Service: Orthopedics;  Laterality: Right;    SOCIAL HISTORY: Social History   Socioeconomic History  . Marital status: Single    Spouse name: Not on file  . Number of children: Not on file  . Years of education: Not on file  . Highest education level: Not on file  Occupational History  . Occupation: Therapist, sports at [employer]: [employer]  Tobacco Use  . Smoking status: Never Smoker  . Smokeless tobacco: Never Used  Substance and Sexual Activity  . Alcohol use: No  . Drug use: No  . Sexual activity: Not on file  Other Topics Concern  . 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 Difficulty of Paying Living Expenses: Not on file  Food Insecurity:   . Worried About Charity fundraiser in the Last Year: Not on file  . Ran Out of Food in the Last Year: Not on file  Transportation Needs:   . Lack of Transportation (Medical): Not on file  . Lack of Transportation (Non-Medical): Not on file  Physical Activity:   . Days of Exercise per Week: Not on file  . Minutes of Exercise per Session: Not on file  Stress:   . Feeling of Stress : Not on file  Social Connections:   .  Frequency of Communication with Friends and Family: Not on file  . Frequency of Social Gatherings with Friends and Family: Not on file  . Attends Religious Services: Not on file  . Active Member of Clubs or Organizations: Not on file  . Attends Archivist Meetings: Not on file  . Marital Status: Not on file  Intimate Partner Violence:   . Fear of Current or Ex-Partner: Not on file  . Emotionally Abused: Not on file  . Physically Abused: Not on file  . Sexually Abused: Not on file    FAMILY HISTORY: Family History  Problem Relation Age of Onset  . Cancer Mother        breast cancer  . Cancer Father        plasma sarcoma  . Breast cancer Neg Hx     ALLERGIES:  is allergic to amlodipine.  MEDICATIONS:  Current Outpatient Medications  Medication Sig Dispense Refill  . ALPRAZolam (XANAX) 1 MG tablet TAKE 1/2 TO 1 TABLET BY MOUTH TWICE DAILY AS NEEDED FOR ANXIETY 60 tablet 5  . aspirin 81 MG tablet Take 81 mg by mouth daily.    . benazepril-hydrochlorthiazide (LOTENSIN HCT) 20-12.5 MG tablet Take 1 tablet by mouth daily. 90 tablet 3  . Blood Glucose Monitoring Suppl (ONETOUCH VERIO) w/Device KIT Use to check blood sugar 3 times a day 1 kit 0  . Cholecalciferol (VITAMIN D3) 2000 units TABS Take 2,000 mcg by mouth daily.    . Cyanocobalamin (B-12) 1000 MCG SUBL Place 1 tablet under the tongue daily.    Marland Kitchen doxazosin (CARDURA) 2 MG tablet Take 1 tablet  (2 mg total) by mouth daily. 30 tablet 3  . Esomeprazole Magnesium (NEXIUM PO) Take 22.5 mg by mouth daily.    Marland Kitchen ezetimibe (ZETIA) 10 MG tablet Take 1 tablet (10 mg total) by mouth daily. 90 tablet 3  . gabapentin (NEURONTIN) 300 MG capsule Take 1 capsule (300 mg total) by mouth at bedtime. 90 capsule 2  . glipiZIDE (GLUCOTROL) 5 MG tablet Take 0.5-1 tablets (2.5-5 mg total) by mouth daily before supper. 90 tablet 3  . glucose blood (ONETOUCH VERIO) test strip Use to check blood sugar 3 times a day. 300 each 12  . Insulin Glargine (LANTUS SOLOSTAR) 100 UNIT/ML Solostar Pen Inject 12 Units into the skin daily. 5 pen 11  . Insulin Pen Needle 32G X 4 MM MISC Use 1x a day 100 each 3  . iron polysaccharides (NIFEREX) 150 MG capsule Take 150 mg by mouth daily.    Elmore Guise Devices (LANCING DEVICE) MISC Use as advised - Freestyle 1 each 0  . metoprolol succinate (TOPROL-XL) 25 MG 24 hr tablet TAKE 1 TABLET BY MOUTH DAILY. 90 tablet 2  . Multiple Vitamins-Minerals (HM MULTIVITAMIN ADULT GUMMY PO) Take 1 tablet by mouth daily.    Glory Rosebush Delica Lancets 67M MISC 1 each by Does not apply route 3 (three) times daily. To check blood sugar 3 times a day 300 each 11  . simvastatin (ZOCOR) 40 MG tablet Take 1 tablet (40 mg total) by mouth at bedtime. 90 tablet 0  . traMADol (ULTRAM) 50 MG tablet Take 50 mg by mouth at bedtime.     No current facility-administered medications for this visit.    REVIEW OF SYSTEMS:   A 10+ POINT REVIEW OF SYSTEMS WAS OBTAINED including neurology, dermatology, psychiatry, cardiac, respiratory, lymph, extremities, GI, GU, Musculoskeletal, constitutional, breasts, reproductive, HEENT.  All pertinent positives are noted in the  HPI.  All others are negative.   PHYSICAL EXAMINATION: ECOG PERFORMANCE STATUS: 1 - Symptomatic but completely ambulatory  Vitals:   11/12/19 1422  BP: 127/64  Pulse: 85  Resp: 18  Temp: 98 F (36.7 C)  SpO2: 95%   Filed Weights   11/12/19 1422   Weight: 188 lb (85.3 kg)   .Body mass index is 37.97 kg/m.  GENERAL:alert, in no acute distress and comfortable SKIN: no acute rashes, no significant lesions EYES: conjunctiva are pink and non-injected, sclera anicteric OROPHARYNX: MMM, no exudates, no oropharyngeal erythema or ulceration NECK: supple, no JVD LYMPH:  no palpable lymphadenopathy in the axillary or inguinal regions. few small cervical lymphadenopathy that are unchanged. LUNGS: clear to auscultation b/l with normal respiratory effort HEART: regular rate & rhythm ABDOMEN:  normoactive bowel sounds , non tender, not distended. No palpable hepatosplenomegaly.  Extremity: no pedal edema PSYCH: alert & oriented x 3 with fluent speech NEURO: no focal motor/sensory deficits  LABORATORY DATA:  I have reviewed the data as listed  . CBC Latest Ref Rng & Units 11/12/2019 05/11/2019 11/10/2018  WBC 4.0 - 10.5 K/uL 8.8 8.8 9.1  Hemoglobin 12.0 - 15.0 g/dL 13.3 12.3 11.9(L)  Hematocrit 36.0 - 46.0 % 41.7 40.1 38.7  Platelets 150 - 400 K/uL 156 167 182  hgb 10.7 ANC 3.2k .Marland Kitchen CBC    Component Value Date/Time   WBC 8.8 11/12/2019 1344   WBC 8.8 05/11/2019 1119   RBC 4.67 11/12/2019 1344   HGB 13.3 11/12/2019 1344   HCT 41.7 11/12/2019 1344   PLT 156 11/12/2019 1344   MCV 89.3 11/12/2019 1344   MCH 28.5 11/12/2019 1344   MCHC 31.9 11/12/2019 1344   RDW 12.3 11/12/2019 1344   LYMPHSABS 4.3 (H) 11/12/2019 1344   MONOABS 0.6 11/12/2019 1344   EOSABS 0.2 11/12/2019 1344   BASOSABS 0.1 11/12/2019 1344    CMP Latest Ref Rng & Units 11/12/2019 05/30/2019 05/30/2019  Glucose 70 - 99 mg/dL 122(H) 190(H) -  BUN 8 - 23 mg/dL 37(H) 35(H) -  Creatinine 0.44 - 1.00 mg/dL 1.43(H) 1.46(H) -  Sodium 135 - 145 mmol/L 139 135 -  Potassium 3.5 - 5.1 mmol/L 5.1 4.5 -  Chloride 98 - 111 mmol/L 104 102 -  CO2 22 - 32 mmol/L 25 22 -  Calcium 8.9 - 10.3 mg/dL 9.7 9.7 -  Total Protein 6.5 - 8.1 g/dL 7.0 6.4 6.4  Total Bilirubin 0.3 - 1.2  mg/dL 0.6 0.7 0.7  Alkaline Phos 38 - 126 U/L 98 81 82  AST 15 - 41 U/L 14(L) 21 21  ALT 0 - 44 U/L 13 19 18      12/15/17 FISH CLL Prognostic Panel:    01/03/19 Left axillary LN biopsy:    RADIOGRAPHIC STUDIES: I have personally reviewed the radiological images as listed and agreed with the findings in the report. No results found.  ASSESSMENT & PLAN:  CLARINDA OBI is a 69 y.o. 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 Lab Results  Component Value Date   LDH 196 (H) 11/12/2019   05/01/18 CT C/A/P revealed Mildly progressive lymphadenopathy in the chest, abdomen, and pelvis, corresponding to the patient's known CLL, as above. Dominant left external iliac node measures 3.3 cm short axis,  previously 2.8cm. Spleen is normal in size. Numerous bilateral pulmonary nodules measuring up to 9 mm, grossly unchanged from 2017. Prior bilateral pleural effusions have resolved.   09/22/18 [place] Bilateral Axilla revealed Ultrasound is performed, showing numerous enlarged LEFT axillary lymph nodes. Largest lymph node in the LEFT axilla has cortical thickening of 9 millimeters. Largest lymph node is 3.3 centimeters in length. Evaluation of the contralateral axilla for comparison demonstrate lymph nodes with cortical thickening. Largest lymph node in the RIGHT axilla is 3.4 x 1.3 centimeters.  01/03/19 Left axillary LN biopsy revealed CLL  2. Multiple pulmonary nodules   CT AP on 11/17/17 - with 1. Pelvic lymphadenopathy and innumerable pulmonary nodules in the lung bases, consistent with metastatic disease. 2. Cholelithiasis without CT evidence of acute cholecystitis. 3.  Aortic atherosclerosis  CT Chest on 11/20/17 - with multiple small lung  nodules in LLL that appear stable from prior CT in 12/2015 and are considered benign and Upper lung nodules also appear benign. Also with several borderline enlarged lymph nodes concerning for a lymphoproliferative disorder   3. H/o endometrial cancer in 1998 -localized to uterus -treated with surgery, abdominal hysterectomy  -no evidence of recurrence  4. Microcytic anemia - Fe deficiency  Presented in hospital with Hgb of 6.6 Hgb holding steady s/p 2U PRBC - Fe infusioncompleted 1/19- stool hemoccult negative - suspect this may be due to CKD, but iron deficiency also worrisome for occult GIB  -Tolerated IV iron well  . Lab Results  Component Value Date   IRON 88 11/10/2018   TIBC 403 11/10/2018   IRONPCTSAT 22 11/10/2018   (Iron and TIBC)  Lab Results  Component Value Date   FERRITIN 78 11/12/2019    5. Marginal B12 levels in hospital  (Antiparietal celll and anti IF Ab negative) -given one B12 injection in hospital  PLAN:  -Discussed pt labwork today, 11/12/19; blood counts and chemistries are normal, kidney numbers are stable, potassium is high normal -Discussed 11/12/2019 LDH is stable at 196 -Discussed 11/12/2019 Ferritin is WNL at 78 -Anemia has corrected  -Discussed constitutional symptoms to be mindful of -Discussed again the increased risk for squamous cell carcinomas in CLL patients with 13q deletion -Recommend pt continue skin screening with a dermatologist every 6 mo to 1 yr  -No lab or clinical evidence of CLL progression at this time  -Continue on 22m aspirin -Continue [id number] Vitamin B12  -Continue PO 15083mron polysaccharide every day -Will hold off on imaging in the setting of COVID19 -Will see the pt back in 6 months   FOLLOW UP: RTC with Dr KalIrene Limboth labs in 6 months  The total time spent in the appt was 20 minutes and more than 50% was on counseling and direct patient cares.  All of the patient's questions were answered with apparent  satisfaction. The patient knows to call the clinic with any problems, questions or concerns.   GauSullivan Lone MS OrlandoHIVMS SCHWest Anaheim Medical CenterHFallon Medical Complex Hospitalmatology/Oncology Physician ConWashington HospitalOffice):       [phone] cell):  [phone]):           [phone]/09/2020 3:12 PM  I, JazYevette Edwardsm acting as a scribe for Dr. GauSullivan Lone .I have reviewed the above documentation for accuracy and completeness, and I agree with the above. .GaBrunetta Genera

## 2019-11-12 NOTE — Telephone Encounter (Signed)
Patient decline avs and calendar °

## 2019-11-22 ENCOUNTER — Encounter: Payer: Self-pay | Admitting: Internal Medicine

## 2019-11-22 ENCOUNTER — Encounter: Payer: Self-pay | Admitting: Family Medicine

## 2019-11-26 ENCOUNTER — Other Ambulatory Visit: Payer: Self-pay

## 2019-11-26 MED ORDER — EZETIMIBE 10 MG PO TABS: 10.0000 mg | ORAL_TABLET | Freq: Every day | ORAL | 3 refills | Status: DC

## 2019-11-30 DIAGNOSIS — K219 Gastro-esophageal reflux disease without esophagitis: Secondary | ICD-10-CM | POA: Diagnosis not present

## 2019-11-30 DIAGNOSIS — E785 Hyperlipidemia, unspecified: Secondary | ICD-10-CM | POA: Diagnosis not present

## 2019-11-30 DIAGNOSIS — I129 Hypertensive chronic kidney disease with stage 1 through stage 4 chronic kidney disease, or unspecified chronic kidney disease: Secondary | ICD-10-CM | POA: Diagnosis not present

## 2019-11-30 DIAGNOSIS — E875 Hyperkalemia: Secondary | ICD-10-CM | POA: Diagnosis not present

## 2019-11-30 DIAGNOSIS — E1122 Type 2 diabetes mellitus with diabetic chronic kidney disease: Secondary | ICD-10-CM | POA: Diagnosis not present

## 2019-11-30 DIAGNOSIS — N1832 Chronic kidney disease, stage 3b: Secondary | ICD-10-CM | POA: Diagnosis not present

## 2019-12-04 ENCOUNTER — Other Ambulatory Visit: Payer: Self-pay

## 2019-12-04 MED ORDER — GABAPENTIN 300 MG PO CAPS: 300.0000 mg | ORAL_CAPSULE | Freq: Every day | ORAL | 3 refills | Status: DC

## 2019-12-05 ENCOUNTER — Other Ambulatory Visit: Payer: Self-pay | Admitting: Nephrology

## 2019-12-05 DIAGNOSIS — N1832 Chronic kidney disease, stage 3b: Secondary | ICD-10-CM

## 2019-12-10 ENCOUNTER — Other Ambulatory Visit: Payer: Self-pay

## 2019-12-10 ENCOUNTER — Ambulatory Visit
Admission: RE | Admit: 2019-12-10 | Discharge: 2019-12-10 | Disposition: A | Discharge status: Home / Self Care | Payer: Medicare Other | Source: Ambulatory Visit | Attending: Nephrology | Admitting: Nephrology

## 2019-12-10 ENCOUNTER — Encounter: Payer: Self-pay | Admitting: Family Medicine

## 2019-12-10 DIAGNOSIS — N189 Chronic kidney disease, unspecified: Secondary | ICD-10-CM | POA: Diagnosis not present

## 2019-12-10 DIAGNOSIS — N1832 Chronic kidney disease, stage 3b: Secondary | ICD-10-CM

## 2019-12-10 MED ORDER — ALPRAZOLAM 1 MG PO TABS: 0.5000 mg | ORAL_TABLET | Freq: Two times a day (BID) | ORAL | 5 refills | Status: DC | PRN

## 2019-12-10 MED ORDER — BUPROPION HCL ER (XL) 300 MG PO TB24: 300.0000 mg | ORAL_TABLET | Freq: Every day | ORAL | 2 refills | Status: DC

## 2019-12-10 NOTE — Progress Notes (Signed)
TA-Plz see refill req for Alprazolam/Per [place] PMP pt is compliant without red flags/last filled 1.13.21/prepared and pended for your approval/she will be seeing Dr. Loletha Grayer out a ways for her first available/thx dmf

## 2020-01-02 NOTE — Progress Notes (Signed)
PCP - Loney Hering lov 11-12-19 epic Cardiologist - 10-17-19 clearance Dr. Pershing Cox epic lov 10-15-19 lov hematology 11-12-19 epic  Chest x-ray -  EKG - 10-15-19 epic Stress Test -  ECHO -  Cardiac Cath -  hgba1c 6.9 11-06-19 epic  Sleep Study -  CPAP -   Fasting Blood Sugar - 120-140 Checks Blood Sugar __3___ times a day  Blood Thinner Instructions: Aspirin Instructions:74m last dose 3/4 Last Dose:  Anesthesia review: hyperkalemia ,CLL  Patient denies shortness of breath, fever, cough and chest pain at PAT appointment   NONE   Patient verbalized understanding of instructions that were given to them at the PAT appointment. Patient was also instructed that they will need to review over the PAT instructions again at home before surgery.

## 2020-01-02 NOTE — Patient Instructions (Addendum)
DUE TO COVID-19 ONLY ONE VISITOR IS ALLOWED TO COME WITH YOU AND STAY IN THE WAITING ROOM ONLY DURING PRE OP AND PROCEDURE DAY OF SURGERY. THE 1 VISITOR MAY VISIT WITH YOU AFTER SURGERY IN YOUR PRIVATE ROOM DURING VISITING HOURS ONLY!   YOU NEED TO HAVE A COVID 19 TEST ON__3-8-21_____ @__1010  am _____, THIS TEST MUST BE DONE BEFORE SURGERY, COME  [place], [place] [address] [place] , [id number].  ([place]) ONCE YOUR COVID TEST IS COMPLETED, PLEASE BEGIN THE QUARANTINE INSTRUCTIONS AS OUTLINED IN YOUR HANDOUT.                SHYNA DUIGNAN  01/02/2020   Your procedure is scheduled on:      01-10-20   Report to [facility] Main  Entrance   Report to short stay  at        0530 AM     Call this number if you have problems the morning of surgery [phone]    Remember: NO SOLID FOOD AFTER MIDNIGHT THE NIGHT PRIOR TO SURGERY. NOTHING BY MOUTH EXCEPT CLEAR LIQUIDS UNTIL    0430  am  . PLEASE FINISH G2  DRINK PER SURGEON ORDER  WHICH NEEDS TO BE COMPLETED AT       0430 am then nothing by mouth .    CLEAR LIQUID DIET   Foods Allowed                                                                                    Foods Excluded  Coffee and tea, regular and decaf                                                liquids that you cannot  Plain Jell-O any favor except red or purple                                           see through such as: Fruit ices (not with fruit pulp)                                                         milk, soups, orange juice  Iced Popsicles                                                              All solid food Carbonated beverages, regular and diet  Cranberry, grape and apple juices Sports drinks like Gatorade Lightly seasoned clear broth or consume(fat free) Sugar, honey syrup   _____________________________________________________________________     BRUSH YOUR TEETH MORNING OF SURGERY AND  RINSE YOUR MOUTH OUT, NO CHEWING GUM CANDY OR MINTS.     Take these medicines the morning of surgery with A SIP OF WATER: metoprolol, doxazosin, wellbutrin, xanax if needed  DO NOT TAKE ANY DIABETIC MEDICATIONS DAY OF YOUR SURGERY   Lantus take 50 % of your normal evening dose night before surgery  None  day of  Do not take evening dose of glipizide day before surgery                                You may not have any metal on your body including hair pins and              piercings  Do not wear jewelry, make-up, lotions, powders or perfumes, deodorant             Do not wear nail polish on your fingernails.  Do not shave  48 hours prior to surgery.                 Do not bring valuables to the hospital. [place].  Contacts, dentures or bridgework may not be worn into surgery.  Leave suitcase in the car. After surgery it may be brought to your room.     Patients discharged the day of surgery will not be allowed to drive home. IF YOU ARE HAVING SURGERY AND GOING HOME THE SAME DAY, YOU MUST HAVE AN ADULT TO DRIVE YOU HOME AND BE WITH YOU FOR 24 HOURS. YOU MAY GO HOME BY TAXI OR UBER OR ORTHERWISE, BUT AN ADULT MUST ACCOMPANY YOU HOME AND STAY WITH YOU FOR 24 HOURS.  Name and phone number of your driver:  Special Instructions: N/A              Please read over the following fact sheets you were given: _____________________________________________________________________ [facility]- Preparing for Total Shoulder Arthroplasty    Before surgery, you can play an important role. Because skin is not sterile, your skin needs to be as free of germs as possible. You can reduce the number of germs on your skin by using the following products. . Benzoyl Peroxide Gel o Reduces the number of germs present on the skin o Applied twice a day to shoulder area starting two days before surgery     ==================================================================  Please follow these instructions carefully:  BENZOYL PEROXIDE 5% GEL  Please do not use if you have an allergy to benzoyl peroxide.   If your skin becomes reddened/irritated stop using the benzoyl peroxide.  Starting two days before surgery, apply as follows: 1. Apply benzoyl peroxide in the morning and at night. Apply after taking a shower. If you are not taking a shower clean entire shoulder front, back, and side along with the armpit with a clean wet washcloth.  2. Place a quarter-sized dollop on your shoulder and rub in thoroughly, making sure to cover the front, back, and side of your shoulder, along with the armpit.   2 days before ____ AM   ____ PM              1 day before  ____ AM   ____ PM                         3. Do this twice a day for two days.  (Last application is the night before surgery, AFTER using the CHG soap as described below).  4. Do NOT apply benzoyl peroxide gel on the day of surgery.             [facility] - Preparing for Surgery Before surgery, you can play an important role.  Because skin is not sterile, your skin needs to be as free of germs as possible.  You can reduce the number of germs on your skin by washing with CHG (chlorahexidine gluconate) soap before surgery.  CHG is an antiseptic cleaner which kills germs and bonds with the skin to continue killing germs even after washing. Please DO NOT use if you have an allergy to CHG or antibacterial soaps.  If your skin becomes reddened/irritated stop using the CHG and inform your nurse when you arrive at Short Stay. Do not shave (including legs and underarms) for at least 48 hours prior to the first CHG shower.  You may shave your face/neck. Please follow these instructions carefully:  1.  Shower with CHG Soap the night before surgery and the  morning of Surgery.  2.  If you choose to wash your hair, wash your hair first as usual with  your  normal  shampoo.  3.  After you shampoo, rinse your hair and body thoroughly to remove the  shampoo.                           4.  Use CHG as you would any other liquid soap.  You can apply chg directly  to the skin and wash                       Gently with a scrungie or clean washcloth.  5.  Apply the CHG Soap to your body ONLY FROM THE NECK DOWN.   Do not use on face/ open                           Wound or open sores. Avoid contact with eyes, ears mouth and genitals (private parts).                       Wash face,  Genitals (private parts) with your normal soap.             6.  Wash thoroughly, paying special attention to the area where your surgery  will be performed.  7.  Thoroughly rinse your body with warm water from the neck down.  8.  DO NOT shower/wash with your normal soap after using and rinsing off  the CHG Soap.                9.  Pat yourself dry with a clean towel.            10.  Wear clean pajamas.            11.  Place clean sheets on your bed the night of your first shower and do not  sleep with pets. Day of Surgery : Do not apply any lotions/deodorants the morning of surgery.  Please wear clean clothes to the hospital/surgery  center.  FAILURE TO FOLLOW THESE INSTRUCTIONS MAY RESULT IN THE CANCELLATION OF YOUR SURGERY PATIENT SIGNATURE_________________________________  NURSE SIGNATURE__________________________________  ________________________________________________________________________   Adam Phenix  An incentive spirometer is a tool that can help keep your lungs clear and active. This tool measures how well you are filling your lungs with each breath. Taking long deep breaths may help reverse or decrease the chance of developing breathing (pulmonary) problems (especially infection) following:  A long period of time when you are unable to move or be active. BEFORE THE PROCEDURE   If the spirometer includes an indicator to show your best effort,  your nurse or respiratory therapist will set it to a desired goal.  If possible, sit up straight or lean slightly forward. Try not to slouch.  Hold the incentive spirometer in an upright position. INSTRUCTIONS FOR USE  1. Sit on the edge of your bed if possible, or sit up as far as you can in bed or on a chair. 2. Hold the incentive spirometer in an upright position. 3. Breathe out normally. 4. Place the mouthpiece in your mouth and seal your lips tightly around it. 5. Breathe in slowly and as deeply as possible, raising the piston or the ball toward the top of the column. 6. Hold your breath for 3-5 seconds or for as long as possible. Allow the piston or ball to fall to the bottom of the column. 7. Remove the mouthpiece from your mouth and breathe out normally. 8. Rest for a few seconds and repeat Steps 1 through 7 at least 10 times every 1-2 hours when you are awake. Take your time and take a few normal breaths between deep breaths. 9. The spirometer may include an indicator to show your best effort. Use the indicator as a goal to work toward during each repetition. 10. After each set of 10 deep breaths, practice coughing to be sure your lungs are clear. If you have an incision (the cut made at the time of surgery), support your incision when coughing by placing a pillow or rolled up towels firmly against it. Once you are able to get out of bed, walk around indoors and cough well. You may stop using the incentive spirometer when instructed by your caregiver.  RISKS AND COMPLICATIONS  Take your time so you do not get dizzy or light-headed.  If you are in pain, you may need to take or ask for pain medication before doing incentive spirometry. It is harder to take a deep breath if you are having pain. AFTER USE  Rest and breathe slowly and easily.  It can be helpful to keep track of a log of your progress. Your caregiver can provide you with a simple table to help with this. If you are  using the spirometer at home, follow these instructions: [place] IF:   You are having difficultly using the spirometer.  You have trouble using the spirometer as often as instructed.  Your pain medication is not giving enough relief while using the spirometer.  You develop fever of 100.5 F (38.1 C) or higher. SEEK IMMEDIATE MEDICAL CARE IF:   You cough up bloody sputum that had not been present before.  You develop fever of 102 F (38.9 C) or greater.  You develop worsening pain at or near the incision site. MAKE SURE YOU:   Understand these instructions.  Will watch your condition.  Will get help right away if you are not doing well or get worse. Document  Released: 02/28/2007 Document Revised: 01/10/2012 Document Reviewed: 05/01/2007 [facility] Patient Information 2014 [place], [place].   ________________________________________________________________________

## 2020-01-03 ENCOUNTER — Encounter (HOSPITAL_COMMUNITY)
Admission: RE | Admit: 2020-01-03 | Discharge: 2020-01-03 | Disposition: A | Discharge status: Home / Self Care | Payer: Medicare Other | Source: Ambulatory Visit | Attending: Orthopedic Surgery | Admitting: Orthopedic Surgery

## 2020-01-03 ENCOUNTER — Other Ambulatory Visit: Payer: Self-pay

## 2020-01-03 ENCOUNTER — Encounter (HOSPITAL_COMMUNITY): Payer: Self-pay

## 2020-01-03 DIAGNOSIS — E875 Hyperkalemia: Secondary | ICD-10-CM | POA: Insufficient documentation

## 2020-01-03 DIAGNOSIS — Z01812 Encounter for preprocedural laboratory examination: Secondary | ICD-10-CM | POA: Insufficient documentation

## 2020-01-03 HISTORY — DX: Personal history of other diseases of the digestive system: Z87.19

## 2020-01-03 HISTORY — DX: Hyperkalemia: E87.5

## 2020-01-03 LAB — CBC
HCT: 39.6 % (ref 36.0–46.0)
Hemoglobin: 12.6 g/dL (ref 12.0–15.0)
MCH: 29.3 pg (ref 26.0–34.0)
MCHC: 31.8 g/dL (ref 30.0–36.0)
MCV: 92.1 fL (ref 80.0–100.0)
Platelets: 144 10*3/uL — ABNORMAL LOW (ref 150–400)
RBC: 4.3 MIL/uL (ref 3.87–5.11)
RDW: 12.5 % (ref 11.5–15.5)
WBC: 10.2 10*3/uL (ref 4.0–10.5)
nRBC: 0 % (ref 0.0–0.2)

## 2020-01-03 LAB — BASIC METABOLIC PANEL
Anion gap: 10 (ref 5–15)
BUN: 47 mg/dL — ABNORMAL HIGH (ref 8–23)
CO2: 24 mmol/L (ref 22–32)
Calcium: 9.4 mg/dL (ref 8.9–10.3)
Chloride: 104 mmol/L (ref 98–111)
Creatinine, Ser: 1.43 mg/dL — ABNORMAL HIGH (ref 0.44–1.00)
GFR calc Af Amer: 43 mL/min — ABNORMAL LOW (ref >60)
GFR calc non Af Amer: 37 mL/min — ABNORMAL LOW (ref >60)
Glucose, Bld: 165 mg/dL — ABNORMAL HIGH (ref 70–99)
Potassium: 4.8 mmol/L (ref 3.5–5.1)
Sodium: 138 mmol/L (ref 135–145)

## 2020-01-03 LAB — GLUCOSE, CAPILLARY: Glucose-Capillary: 152 mg/dL — ABNORMAL HIGH (ref 70–99)

## 2020-01-03 LAB — SURGICAL PCR SCREEN
MRSA, PCR: NEGATIVE
Staphylococcus aureus: NEGATIVE

## 2020-01-07 ENCOUNTER — Other Ambulatory Visit (HOSPITAL_COMMUNITY)
Admission: RE | Admit: 2020-01-07 | Discharge: 2020-01-07 | Disposition: A | Discharge status: Home / Self Care | Payer: Medicare Other | Source: Ambulatory Visit | Attending: Orthopedic Surgery | Admitting: Orthopedic Surgery

## 2020-01-07 DIAGNOSIS — Z20822 Contact with and (suspected) exposure to covid-19: Secondary | ICD-10-CM | POA: Diagnosis not present

## 2020-01-07 DIAGNOSIS — Z01812 Encounter for preprocedural laboratory examination: Secondary | ICD-10-CM | POA: Insufficient documentation

## 2020-01-08 LAB — SARS CORONAVIRUS 2 (TAT 6-24 HRS): SARS Coronavirus 2: NEGATIVE

## 2020-01-09 NOTE — Anesthesia Preprocedure Evaluation (Addendum)
Anesthesia Evaluation  Patient identified by MRN, date of birth, ID band Patient awake    Reviewed: Allergy & Precautions, NPO status , Patient's Chart, lab work & pertinent test results  History of Anesthesia Complications (+) PONV  Airway Mallampati: III  TM Distance: <3 FB Neck ROM: Full    Dental no notable dental hx.    Pulmonary neg pulmonary ROS,    breath sounds clear to auscultation + decreased breath sounds      Cardiovascular hypertension, Pt. on medications and Pt. on home beta blockers negative cardio ROS Normal cardiovascular exam Rhythm:Regular Rate:Normal     Neuro/Psych negative neurological ROS  negative psych ROS   GI/Hepatic Neg liver ROS, GERD  Medicated,  Endo/Other  diabetes, Insulin Dependentobesity  Renal/GU Renal InsufficiencyRenal disease  negative genitourinary   Musculoskeletal negative musculoskeletal ROS (+)   Abdominal   Peds negative pediatric ROS (+)  Hematology negative hematology ROS (+)   Anesthesia Other Findings   Reproductive/Obstetrics negative OB ROS                            Anesthesia Physical Anesthesia Plan  ASA: III  Anesthesia Plan: General   Post-op Pain Management:  Regional for Post-op pain   Induction: Intravenous  PONV Risk Score and Plan: 4 or greater and Ondansetron, Treatment may vary due to age or medical condition, Dexamethasone and Scopolamine patch - Pre-op  Airway Management Planned: Oral ETT  Additional Equipment:   Intra-op Plan:   Post-operative Plan: Extubation in OR  Informed Consent: I have reviewed the patients History and Physical, chart, labs and discussed the procedure including the risks, benefits and alternatives for the proposed anesthesia with the patient or authorized representative who has indicated his/her understanding and acceptance.     Dental advisory given  Plan Discussed with: CRNA and  Surgeon  Anesthesia Plan Comments: (Has had difficulty in the past with her pulmonary function after GA in the past. May require stepdown bed for monitoring)       Anesthesia Quick Evaluation

## 2020-01-10 ENCOUNTER — Other Ambulatory Visit: Payer: Self-pay

## 2020-01-10 ENCOUNTER — Ambulatory Visit (HOSPITAL_COMMUNITY): Payer: Medicare Other | Admitting: Certified Registered Nurse Anesthetist

## 2020-01-10 ENCOUNTER — Ambulatory Visit (HOSPITAL_COMMUNITY)
Admission: RE | Admit: 2020-01-10 | Discharge: 2020-01-11 | Disposition: A | Discharge status: Home / Self Care | Payer: Medicare Other | Attending: Orthopedic Surgery | Admitting: Orthopedic Surgery

## 2020-01-10 ENCOUNTER — Encounter (HOSPITAL_COMMUNITY): Payer: Self-pay | Admitting: Orthopedic Surgery

## 2020-01-10 ENCOUNTER — Encounter (HOSPITAL_COMMUNITY)
Admission: RE | Disposition: A | Discharge status: Home / Self Care | Payer: Self-pay | Source: Home / Self Care | Attending: Orthopedic Surgery

## 2020-01-10 DIAGNOSIS — Z79899 Other long term (current) drug therapy: Secondary | ICD-10-CM | POA: Diagnosis not present

## 2020-01-10 DIAGNOSIS — F329 Major depressive disorder, single episode, unspecified: Secondary | ICD-10-CM | POA: Insufficient documentation

## 2020-01-10 DIAGNOSIS — F419 Anxiety disorder, unspecified: Secondary | ICD-10-CM | POA: Insufficient documentation

## 2020-01-10 DIAGNOSIS — M19012 Primary osteoarthritis, left shoulder: Secondary | ICD-10-CM | POA: Diagnosis not present

## 2020-01-10 DIAGNOSIS — N183 Chronic kidney disease, stage 3 unspecified: Secondary | ICD-10-CM | POA: Diagnosis not present

## 2020-01-10 DIAGNOSIS — Z96612 Presence of left artificial shoulder joint: Secondary | ICD-10-CM

## 2020-01-10 DIAGNOSIS — Z7982 Long term (current) use of aspirin: Secondary | ICD-10-CM | POA: Diagnosis not present

## 2020-01-10 DIAGNOSIS — E669 Obesity, unspecified: Secondary | ICD-10-CM | POA: Diagnosis not present

## 2020-01-10 DIAGNOSIS — Z8542 Personal history of malignant neoplasm of other parts of uterus: Secondary | ICD-10-CM | POA: Insufficient documentation

## 2020-01-10 DIAGNOSIS — Z794 Long term (current) use of insulin: Secondary | ICD-10-CM | POA: Insufficient documentation

## 2020-01-10 DIAGNOSIS — Z6836 Body mass index (BMI) 36.0-36.9, adult: Secondary | ICD-10-CM | POA: Diagnosis not present

## 2020-01-10 DIAGNOSIS — Z96652 Presence of left artificial knee joint: Secondary | ICD-10-CM | POA: Insufficient documentation

## 2020-01-10 DIAGNOSIS — K219 Gastro-esophageal reflux disease without esophagitis: Secondary | ICD-10-CM | POA: Diagnosis not present

## 2020-01-10 DIAGNOSIS — Z856 Personal history of leukemia: Secondary | ICD-10-CM | POA: Diagnosis not present

## 2020-01-10 DIAGNOSIS — I129 Hypertensive chronic kidney disease with stage 1 through stage 4 chronic kidney disease, or unspecified chronic kidney disease: Secondary | ICD-10-CM | POA: Diagnosis not present

## 2020-01-10 DIAGNOSIS — E785 Hyperlipidemia, unspecified: Secondary | ICD-10-CM | POA: Diagnosis not present

## 2020-01-10 DIAGNOSIS — I10 Essential (primary) hypertension: Secondary | ICD-10-CM | POA: Insufficient documentation

## 2020-01-10 DIAGNOSIS — E119 Type 2 diabetes mellitus without complications: Secondary | ICD-10-CM | POA: Diagnosis not present

## 2020-01-10 DIAGNOSIS — Z96611 Presence of right artificial shoulder joint: Secondary | ICD-10-CM | POA: Diagnosis not present

## 2020-01-10 DIAGNOSIS — G8918 Other acute postprocedural pain: Secondary | ICD-10-CM | POA: Diagnosis not present

## 2020-01-10 HISTORY — PX: TOTAL SHOULDER ARTHROPLASTY: SHX126

## 2020-01-10 LAB — GLUCOSE, CAPILLARY
Glucose-Capillary: 141 mg/dL — ABNORMAL HIGH (ref 70–99)
Glucose-Capillary: 141 mg/dL — ABNORMAL HIGH (ref 70–99)
Glucose-Capillary: 177 mg/dL — ABNORMAL HIGH (ref 70–99)
Glucose-Capillary: 206 mg/dL — ABNORMAL HIGH (ref 70–99)
Glucose-Capillary: 222 mg/dL — ABNORMAL HIGH (ref 70–99)

## 2020-01-10 LAB — HEMOGLOBIN A1C
Hgb A1c MFr Bld: 6.9 % — ABNORMAL HIGH (ref 4.8–5.6)
Mean Plasma Glucose: 151.33 mg/dL

## 2020-01-10 SURGERY — ARTHROPLASTY, SHOULDER, TOTAL
Anesthesia: General | Site: Shoulder | Laterality: Left

## 2020-01-10 MED ORDER — ONDANSETRON HCL 4 MG/2ML IJ SOLN: 4.0000 mg | Freq: Four times a day (QID) | INTRAMUSCULAR | Status: DC | PRN

## 2020-01-10 MED ORDER — ROCURONIUM BROMIDE 10 MG/ML (PF) SYRINGE
PREFILLED_SYRINGE | INTRAVENOUS | Status: AC
  Filled 2020-01-10: qty 10

## 2020-01-10 MED ORDER — FENTANYL CITRATE (PF) 100 MCG/2ML IJ SOLN
INTRAMUSCULAR | Status: DC | PRN
  Administered 2020-01-10 (×2): 50 ug via INTRAVENOUS

## 2020-01-10 MED ORDER — METOCLOPRAMIDE HCL 5 MG PO TABS: 5.0000 mg | ORAL_TABLET | Freq: Three times a day (TID) | ORAL | Status: DC | PRN

## 2020-01-10 MED ORDER — PHENOL 1.4 % MT LIQD: 1.0000 | OROMUCOSAL | Status: DC | PRN

## 2020-01-10 MED ORDER — ONDANSETRON HCL 4 MG/2ML IJ SOLN
INTRAMUSCULAR | Status: DC | PRN
  Administered 2020-01-10 (×2): 4 mg via INTRAVENOUS

## 2020-01-10 MED ORDER — BENAZEPRIL HCL 20 MG PO TABS
20.0000 mg | ORAL_TABLET | Freq: Every day | ORAL | Status: DC
  Administered 2020-01-11: 20 mg via ORAL
  Filled 2020-01-10: qty 1

## 2020-01-10 MED ORDER — CEFAZOLIN SODIUM-DEXTROSE 2-4 GM/100ML-% IV SOLN
2.0000 g | INTRAVENOUS | Status: AC
  Administered 2020-01-10: 2 g via INTRAVENOUS

## 2020-01-10 MED ORDER — PHENYLEPHRINE 40 MCG/ML (10ML) SYRINGE FOR IV PUSH (FOR BLOOD PRESSURE SUPPORT)
PREFILLED_SYRINGE | INTRAVENOUS | Status: DC | PRN
  Administered 2020-01-10: 80 ug via INTRAVENOUS

## 2020-01-10 MED ORDER — BUPIVACAINE HCL (PF) 0.5 % IJ SOLN
INTRAMUSCULAR | Status: DC | PRN
  Administered 2020-01-10: 15 mL via PERINEURAL

## 2020-01-10 MED ORDER — METOPROLOL TARTRATE 25 MG PO TABS
25.0000 mg | ORAL_TABLET | Freq: Every day | ORAL | Status: DC
  Administered 2020-01-11: 25 mg via ORAL
  Filled 2020-01-10: qty 1

## 2020-01-10 MED ORDER — LIDOCAINE 2% (20 MG/ML) 5 ML SYRINGE
INTRAMUSCULAR | Status: AC
  Filled 2020-01-10: qty 5

## 2020-01-10 MED ORDER — HYDROMORPHONE HCL 1 MG/ML IJ SOLN: 0.5000 mg | INTRAMUSCULAR | Status: DC | PRN

## 2020-01-10 MED ORDER — DOCUSATE SODIUM 100 MG PO CAPS
100.0000 mg | ORAL_CAPSULE | Freq: Two times a day (BID) | ORAL | Status: DC
  Filled 2020-01-10 (×2): qty 1

## 2020-01-10 MED ORDER — EPHEDRINE 5 MG/ML INJ
INTRAVENOUS | Status: AC
  Filled 2020-01-10: qty 10

## 2020-01-10 MED ORDER — DIPHENHYDRAMINE HCL 12.5 MG/5ML PO ELIX: 12.5000 mg | ORAL_SOLUTION | ORAL | Status: DC | PRN

## 2020-01-10 MED ORDER — TRANEXAMIC ACID-NACL 1000-0.7 MG/100ML-% IV SOLN
INTRAVENOUS | Status: AC
  Filled 2020-01-10: qty 100

## 2020-01-10 MED ORDER — GABAPENTIN 300 MG PO CAPS
300.0000 mg | ORAL_CAPSULE | Freq: Every day | ORAL | Status: DC
  Administered 2020-01-10: 300 mg via ORAL
  Filled 2020-01-10: qty 1

## 2020-01-10 MED ORDER — SUGAMMADEX SODIUM 200 MG/2ML IV SOLN
INTRAVENOUS | Status: DC | PRN
  Administered 2020-01-10: 170 mg via INTRAVENOUS

## 2020-01-10 MED ORDER — 0.9 % SODIUM CHLORIDE (POUR BTL) OPTIME
TOPICAL | Status: DC | PRN
  Administered 2020-01-10: 1000 mL

## 2020-01-10 MED ORDER — BENAZEPRIL-HYDROCHLOROTHIAZIDE 20-12.5 MG PO TABS: 1.0000 | ORAL_TABLET | Freq: Every day | ORAL | Status: DC

## 2020-01-10 MED ORDER — FLEET ENEMA 7-19 GM/118ML RE ENEM: 1.0000 | ENEMA | Freq: Once | RECTAL | Status: DC | PRN

## 2020-01-10 MED ORDER — EPHEDRINE SULFATE-NACL 50-0.9 MG/10ML-% IV SOSY
PREFILLED_SYRINGE | INTRAVENOUS | Status: DC | PRN
  Administered 2020-01-10: 10 mg via INTRAVENOUS

## 2020-01-10 MED ORDER — LACTATED RINGERS IV SOLN: INTRAVENOUS | Status: DC

## 2020-01-10 MED ORDER — OXYCODONE HCL 5 MG PO TABS: 10.0000 mg | ORAL_TABLET | ORAL | Status: DC | PRN

## 2020-01-10 MED ORDER — ALPRAZOLAM 0.5 MG PO TABS: 0.5000 mg | ORAL_TABLET | Freq: Two times a day (BID) | ORAL | Status: DC | PRN

## 2020-01-10 MED ORDER — LIDOCAINE 2% (20 MG/ML) 5 ML SYRINGE
INTRAMUSCULAR | Status: DC | PRN
  Administered 2020-01-10: 100 mg via INTRAVENOUS

## 2020-01-10 MED ORDER — DEXAMETHASONE SODIUM PHOSPHATE 10 MG/ML IJ SOLN
INTRAMUSCULAR | Status: DC | PRN
  Administered 2020-01-10: 5 mg via INTRAVENOUS

## 2020-01-10 MED ORDER — CEFAZOLIN SODIUM-DEXTROSE 2-4 GM/100ML-% IV SOLN
INTRAVENOUS | Status: AC
  Filled 2020-01-10: qty 100

## 2020-01-10 MED ORDER — PANTOPRAZOLE SODIUM 40 MG PO TBEC
40.0000 mg | DELAYED_RELEASE_TABLET | Freq: Every day | ORAL | Status: DC
  Administered 2020-01-10 – 2020-01-11 (×2): 40 mg via ORAL
  Filled 2020-01-10 (×2): qty 1

## 2020-01-10 MED ORDER — TRANEXAMIC ACID-NACL 1000-0.7 MG/100ML-% IV SOLN
1000.0000 mg | INTRAVENOUS | Status: AC
  Administered 2020-01-10: 1000 mg via INTRAVENOUS

## 2020-01-10 MED ORDER — FENTANYL CITRATE (PF) 100 MCG/2ML IJ SOLN
INTRAMUSCULAR | Status: AC
  Filled 2020-01-10: qty 2

## 2020-01-10 MED ORDER — BUPIVACAINE LIPOSOME 1.3 % IJ SUSP
INTRAMUSCULAR | Status: DC | PRN
  Administered 2020-01-10: 10 mL via PERINEURAL

## 2020-01-10 MED ORDER — PROMETHAZINE HCL 25 MG/ML IJ SOLN: 6.2500 mg | INTRAMUSCULAR | Status: DC | PRN

## 2020-01-10 MED ORDER — METHOCARBAMOL 500 MG PO TABS
500.0000 mg | ORAL_TABLET | Freq: Four times a day (QID) | ORAL | Status: DC | PRN
  Administered 2020-01-11: 500 mg via ORAL
  Filled 2020-01-10: qty 1

## 2020-01-10 MED ORDER — FENTANYL CITRATE (PF) 100 MCG/2ML IJ SOLN: 25.0000 ug | INTRAMUSCULAR | Status: DC | PRN

## 2020-01-10 MED ORDER — BISACODYL 5 MG PO TBEC: 5.0000 mg | DELAYED_RELEASE_TABLET | Freq: Every day | ORAL | Status: DC | PRN

## 2020-01-10 MED ORDER — POLYETHYLENE GLYCOL 3350 17 G PO PACK: 17.0000 g | PACK | Freq: Every day | ORAL | Status: DC | PRN

## 2020-01-10 MED ORDER — METHOCARBAMOL 500 MG IVPB - SIMPLE MED
500.0000 mg | Freq: Four times a day (QID) | INTRAVENOUS | Status: DC | PRN
  Filled 2020-01-10: qty 50

## 2020-01-10 MED ORDER — HYDROCHLOROTHIAZIDE 12.5 MG PO CAPS
12.5000 mg | ORAL_CAPSULE | Freq: Every day | ORAL | Status: DC
  Administered 2020-01-11: 12.5 mg via ORAL
  Filled 2020-01-10: qty 1

## 2020-01-10 MED ORDER — MIDAZOLAM HCL 2 MG/2ML IJ SOLN
INTRAMUSCULAR | Status: AC
  Filled 2020-01-10: qty 2

## 2020-01-10 MED ORDER — PROPOFOL 10 MG/ML IV BOLUS
INTRAVENOUS | Status: AC
  Filled 2020-01-10: qty 20

## 2020-01-10 MED ORDER — PHENYLEPHRINE HCL-NACL 10-0.9 MG/250ML-% IV SOLN
INTRAVENOUS | Status: AC
  Filled 2020-01-10: qty 250

## 2020-01-10 MED ORDER — ONDANSETRON HCL 4 MG PO TABS: 4.0000 mg | ORAL_TABLET | Freq: Four times a day (QID) | ORAL | Status: DC | PRN

## 2020-01-10 MED ORDER — DEXAMETHASONE SODIUM PHOSPHATE 10 MG/ML IJ SOLN
INTRAMUSCULAR | Status: AC
  Filled 2020-01-10: qty 1

## 2020-01-10 MED ORDER — OXYCODONE HCL 5 MG PO TABS
5.0000 mg | ORAL_TABLET | ORAL | Status: DC | PRN
  Administered 2020-01-10 – 2020-01-11 (×2): 5 mg via ORAL
  Filled 2020-01-10 (×2): qty 1

## 2020-01-10 MED ORDER — DOXAZOSIN MESYLATE 2 MG PO TABS
2.0000 mg | ORAL_TABLET | Freq: Every day | ORAL | Status: DC
  Administered 2020-01-11: 2 mg via ORAL
  Filled 2020-01-10: qty 1

## 2020-01-10 MED ORDER — PHENYLEPHRINE HCL-NACL 10-0.9 MG/250ML-% IV SOLN
INTRAVENOUS | Status: AC
  Filled 2020-01-10: qty 500

## 2020-01-10 MED ORDER — ONDANSETRON HCL 4 MG/2ML IJ SOLN
INTRAMUSCULAR | Status: AC
  Filled 2020-01-10: qty 2

## 2020-01-10 MED ORDER — ACETAMINOPHEN 325 MG PO TABS: 325.0000 mg | ORAL_TABLET | Freq: Four times a day (QID) | ORAL | Status: DC | PRN

## 2020-01-10 MED ORDER — MENTHOL 3 MG MT LOZG: 1.0000 | LOZENGE | OROMUCOSAL | Status: DC | PRN

## 2020-01-10 MED ORDER — PHENYLEPHRINE 40 MCG/ML (10ML) SYRINGE FOR IV PUSH (FOR BLOOD PRESSURE SUPPORT)
PREFILLED_SYRINGE | INTRAVENOUS | Status: AC
  Filled 2020-01-10: qty 10

## 2020-01-10 MED ORDER — PROPOFOL 10 MG/ML IV BOLUS
INTRAVENOUS | Status: DC | PRN
  Administered 2020-01-10: 70 mg via INTRAVENOUS

## 2020-01-10 MED ORDER — METOCLOPRAMIDE HCL 5 MG/ML IJ SOLN: 5.0000 mg | Freq: Three times a day (TID) | INTRAMUSCULAR | Status: DC | PRN

## 2020-01-10 MED ORDER — ALUM & MAG HYDROXIDE-SIMETH 200-200-20 MG/5ML PO SUSP: 30.0000 mL | ORAL | Status: DC | PRN

## 2020-01-10 MED ORDER — ASPIRIN 81 MG PO TBEC
81.0000 mg | DELAYED_RELEASE_TABLET | ORAL | Status: DC
  Filled 2020-01-10: qty 1

## 2020-01-10 MED ORDER — STERILE WATER FOR IRRIGATION IR SOLN
Status: DC | PRN
  Administered 2020-01-10: 2000 mL

## 2020-01-10 MED ORDER — INSULIN ASPART 100 UNIT/ML ~~LOC~~ SOLN
0.0000 [IU] | Freq: Three times a day (TID) | SUBCUTANEOUS | Status: DC
  Administered 2020-01-10: 3 [IU] via SUBCUTANEOUS
  Administered 2020-01-10: 5 [IU] via SUBCUTANEOUS
  Administered 2020-01-11: 3 [IU] via SUBCUTANEOUS

## 2020-01-10 MED ORDER — ROCURONIUM BROMIDE 50 MG/5ML IV SOSY
PREFILLED_SYRINGE | INTRAVENOUS | Status: DC | PRN
  Administered 2020-01-10: 10 mg via INTRAVENOUS
  Administered 2020-01-10: 60 mg via INTRAVENOUS

## 2020-01-10 MED ORDER — BUPROPION HCL ER (XL) 300 MG PO TB24
300.0000 mg | ORAL_TABLET | Freq: Every day | ORAL | Status: DC
  Administered 2020-01-11: 300 mg via ORAL
  Filled 2020-01-10: qty 1

## 2020-01-10 MED ORDER — CHLORHEXIDINE GLUCONATE 4 % EX LIQD: 60.0000 mL | Freq: Once | CUTANEOUS | Status: DC

## 2020-01-10 MED ORDER — EZETIMIBE 10 MG PO TABS
10.0000 mg | ORAL_TABLET | Freq: Every day | ORAL | Status: DC
  Administered 2020-01-10: 10 mg via ORAL
  Filled 2020-01-10 (×2): qty 1

## 2020-01-10 MED ORDER — PHENYLEPHRINE HCL-NACL 10-0.9 MG/250ML-% IV SOLN
INTRAVENOUS | Status: DC | PRN
  Administered 2020-01-10: 40 ug/min via INTRAVENOUS

## 2020-01-10 MED ORDER — MIDAZOLAM HCL 5 MG/5ML IJ SOLN
INTRAMUSCULAR | Status: DC | PRN
  Administered 2020-01-10 (×2): 1 mg via INTRAVENOUS

## 2020-01-10 SURGICAL SUPPLY — 61 items
BAG ZIPLOCK 12X15 (MISCELLANEOUS) ×3 IMPLANT
BIT DRILL 2.0X128 (BIT) ×2 IMPLANT
BIT DRILL 2.0X128MM (BIT) ×1
BLADE SAW SGTL 83.5X18.5 (BLADE) ×3 IMPLANT
CEMENT BONE DEPUY (Cement) ×3 IMPLANT
COOLER ICEMAN CLASSIC (MISCELLANEOUS) ×3 IMPLANT
COVER BACK TABLE 60X90IN (DRAPES) ×3 IMPLANT
COVER SURGICAL LIGHT HANDLE (MISCELLANEOUS) ×3 IMPLANT
COVER WAND RF STERILE (DRAPES) ×3 IMPLANT
DERMABOND ADVANCED (GAUZE/BANDAGES/DRESSINGS) ×2
DERMABOND ADVANCED .7 DNX12 (GAUZE/BANDAGES/DRESSINGS) ×1 IMPLANT
DRAPE ORTHO SPLIT 77X108 STRL (DRAPES) ×6
DRAPE SHEET LG 3/4 BI-LAMINATE (DRAPES) ×3 IMPLANT
DRAPE SURG 17X11 SM STRL (DRAPES) ×3 IMPLANT
DRAPE SURG ORHT 6 SPLT 77X108 (DRAPES) ×2 IMPLANT
DRAPE U-SHAPE 47X51 STRL (DRAPES) ×3 IMPLANT
DRESSING AQUACEL AG SP 3.5X10 (GAUZE/BANDAGES/DRESSINGS) ×1 IMPLANT
DRSG AQUACEL AG ADV 3.5X10 (GAUZE/BANDAGES/DRESSINGS) ×3 IMPLANT
DRSG AQUACEL AG SP 3.5X10 (GAUZE/BANDAGES/DRESSINGS) ×3
DURAPREP 26ML APPLICATOR (WOUND CARE) ×6 IMPLANT
ELECT BLADE TIP CTD 4 INCH (ELECTRODE) ×3 IMPLANT
ELECT REM PT RETURN 15FT ADLT (MISCELLANEOUS) ×3 IMPLANT
FACESHIELD WRAPAROUND (MASK) ×12 IMPLANT
GLENOID WITH CLEAT MEDIUM (Shoulder) ×3 IMPLANT
GLOVE BIO SURGEON STRL SZ7.5 (GLOVE) ×3 IMPLANT
GLOVE BIO SURGEON STRL SZ8 (GLOVE) ×3 IMPLANT
GLOVE SS BIOGEL STRL SZ 7 (GLOVE) ×1 IMPLANT
GLOVE SS BIOGEL STRL SZ 7.5 (GLOVE) ×1 IMPLANT
GLOVE SUPERSENSE BIOGEL SZ 7 (GLOVE) ×2
GLOVE SUPERSENSE BIOGEL SZ 7.5 (GLOVE) ×2
GOWN STRL REUS W/TWL LRG LVL3 (GOWN DISPOSABLE) ×6 IMPLANT
HEAD HUMERAL UNIV 46/20 (Head) ×3 IMPLANT
KIT BASIN OR (CUSTOM PROCEDURE TRAY) ×3 IMPLANT
KIT TURNOVER KIT A (KITS) IMPLANT
MANIFOLD NEPTUNE II (INSTRUMENTS) ×3 IMPLANT
MARKER SKIN DUAL TIP RULER LAB (MISCELLANEOUS) ×3 IMPLANT
NEEDLE TAPERED W/ NITINOL LOOP (MISCELLANEOUS) ×3 IMPLANT
NS IRRIG 1000ML POUR BTL (IV SOLUTION) ×3 IMPLANT
PACK SHOULDER (CUSTOM PROCEDURE TRAY) ×3 IMPLANT
PAD COLD SHLDR WRAP-ON (PAD) ×3 IMPLANT
PROTECTOR NERVE ULNAR (MISCELLANEOUS) ×3 IMPLANT
RESTRAINT HEAD UNIVERSAL NS (MISCELLANEOUS) ×3 IMPLANT
SLING ARM FOAM STRAP LRG (SOFTGOODS) IMPLANT
SLING ARM FOAM STRAP MED (SOFTGOODS) ×6 IMPLANT
SMARTMIX MINI TOWER (MISCELLANEOUS) ×3
SPONGE LAP 18X18 RF (DISPOSABLE) IMPLANT
STEM HUMERAL UNI APEX 10MM (Shoulder) ×3 IMPLANT
SUCTION FRAZIER HANDLE 12FR (TUBING) ×3
SUCTION TUBE FRAZIER 12FR DISP (TUBING) ×1 IMPLANT
SUT FIBERWIRE #2 38 T-5 BLUE (SUTURE)
SUT MNCRL AB 3-0 PS2 18 (SUTURE) ×3 IMPLANT
SUT MON AB 2-0 CT1 36 (SUTURE) ×3 IMPLANT
SUT VIC AB 1 CT1 36 (SUTURE) ×9 IMPLANT
SUTURE FIBERWR #2 38 T-5 BLUE (SUTURE) IMPLANT
SUTURE TAPE 1.3 40 TPR END (SUTURE) ×4 IMPLANT
SUTURETAPE 1.3 40 TPR END (SUTURE) ×12
TOWEL OR 17X26 10 PK STRL BLUE (TOWEL DISPOSABLE) ×3 IMPLANT
TOWEL OR NON WOVEN STRL DISP B (DISPOSABLE) ×3 IMPLANT
TOWER SMARTMIX MINI (MISCELLANEOUS) ×1 IMPLANT
WATER STERILE IRR 1000ML POUR (IV SOLUTION) ×6 IMPLANT
YANKAUER SUCT BULB TIP 10FT TU (MISCELLANEOUS) ×3 IMPLANT

## 2020-01-10 NOTE — Progress Notes (Signed)
Will continue infusing Neo gtt, per CRNA

## 2020-01-10 NOTE — Discharge Instructions (Signed)
Metta Clines. Supple, M.D., F.A.A.O.S. Orthopaedic Surgery Specializing in Arthroscopic and Reconstructive Surgery of the Shoulder [phone] [address] [place], [place] [address] - Fax [phone]   POST-OP TOTAL SHOULDER REPLACEMENT INSTRUCTIONS  1. Follow up in 10-14 days, if you do not have an appt our office will contact you for scheduling.  2. The bandage over your incision is waterproof. You may begin showering with this dressing on. You may leave this dressing on until first follow up appointment within 2 weeks. We prefer you leave this dressing in place until follow up however after 5-7 days if you are having itching or skin irritation and would like to remove it you may do so. Go slow and tug at the borders gently to break the bond the dressing has with the skin. At this point if there is no drainage it is okay to go without a bandage or you may cover it with a light guaze and tape. You can also expect significant bruising around your shoulder that will drift down your arm and into your chest wall. This is very normal and should resolve over several days.   3. Wear your sling/immobilizer at all times except to perform the exercises below or to occasionally let your arm dangle by your side to stretch your elbow. You also need to sleep in your sling immobilizer until instructed otherwise. It is ok to remove your sling if you are sitting in a controlled environment and allow your arm to rest in a position of comfort by your side or on your lap with pillows to give your neck and skin a break from the sling. You may remove it to allow arm to dangle by side to shower. If you are up walking around and when you go to sleep at night you need to wear it.  4. Range of motion to your elbow, wrist, and hand are encouraged 3-5 times daily. Exercise to your hand and fingers helps to reduce swelling you may experience.  5. Utilize ice to the shoulder 3-5 times minimum a day and  additionally if you are experiencing pain.  6. Prescriptions for a pain medication and a muscle relaxant are provided for you. It is recommended that if you are experiencing pain that you pain medication alone is not controlling, add the muscle relaxant along with the pain medication which can give additional pain relief. The first 1-2 days is generally the most severe of your pain and then should gradually decrease. As your pain lessens it is recommended that you decrease your use of the pain medications to an "as needed basis'" only and to always comply with the recommended dosages of the pain medications.  7. Pain medications can produce constipation along with their use. If you experience this, the use of an over the counter stool softener or laxative daily is recommended.   8. For additional questions or concerns, please do not hesitate to call the office. If after hours there is an answering service to forward your concerns to the physician on call.  9.Pain control following an exparel block  To help control your post-operative pain you received a nerve block  performed with Exparel which is a long acting anesthetic (numbing agent) which can provide pain relief and sensations of numbness (and relief of pain) in the operative shoulder and arm for up to 3 days. Sometimes it provides mixed relief, meaning you may still have numbness in certain areas of the arm but can still  be able to move  parts of that arm, hand, and fingers. We recommend that your prescribed pain medications  be used as needed. We do not feel it is necessary to "pre medicate" and "stay ahead" of pain.  Taking narcotic pain medications when you are not having any pain can lead to unnecessary and potentially dangerous side effects.    10. Use the ice machine as much as possible in the first 5-7 days from surgery, then you can wean its use to as needed. The ice typically needs to be replaced every 6 hours, instead of ice you can  actually freeze water bottles to put in the cooler and then fill water around them to avoid having to purchase ice. You can have spare water bottles freezing to allow you to rotate them once they have melted. Try to have a thin shirt or light cloth or towel under the ice wrap to protect your skin.   11.  We recommend that you avoid any dental work or cleaning in the first 3 months following your joint replacement. This is to help minimize the possibility of infection from the bacteria in your mouth that enters your bloodstream during dental work. We also recommend that you take an antibiotic prior to your dental work for the first year after your shoulder replacement to further help reduce that risk. Please simply contact our office for antibiotics to be sent to your pharmacy prior to dental work.  POST-OP EXERCISES  Pendulum Exercises  Perform pendulum exercises while standing and bending at the waist. Support your uninvolved arm on a table or chair and allow your operated arm to hang freely. Make sure to do these exercises passively - not using you shoulder muscles. These exercises can be performed once your nerve block effects have worn off.  Repeat 20 times. Do 3 sessions per day.

## 2020-01-10 NOTE — Anesthesia Procedure Notes (Signed)
Anesthesia Procedure Image    

## 2020-01-10 NOTE — Transfer of Care (Addendum)
Immediate Anesthesia Transfer of Care Note  Patient: Brenda [name]  Procedure(s) Performed: TOTAL SHOULDER ARTHROPLASTY (Left Shoulder)  Patient Location: PACU  Anesthesia Type:GA combined with regional for post-op pain  Level of Consciousness: awake, alert  and patient cooperative  Airway & Oxygen Therapy: Patient Spontanous Breathing and Patient connected to face mask oxygen  Post-op Assessment: Report given to RN and Post -op Vital signs reviewed and stable  Post vital signs: Reviewed and stable  Last Vitals:  Vitals Value Taken Time  BP 90/45 01/10/20 0942  Temp    Pulse 56 01/10/20 0943  Resp 14 01/10/20 0943  SpO2 99 % 01/10/20 0943  Vitals shown include unvalidated device data.  Last Pain:  Vitals:   01/10/20 0624  TempSrc:   PainSc: 0-No pain         Complications: No apparent anesthesia complications

## 2020-01-10 NOTE — Op Note (Signed)
01/10/2020  9:28 AM  PATIENT:   Brenda [name]  69 y.o. female  PRE-OPERATIVE DIAGNOSIS:  Left shoulder osteoarthritis  POST-OPERATIVE DIAGNOSIS: Same  PROCEDURE: Left anatomic total shoulder arthroplasty utilizing a press-fit size 10 Arthrex stem with a 46 x 20 eccentric head and a medium glenoid  SURGEON:  Simonne Boulos, Metta Clines M.D.  ASSISTANTS: [name], PA-C  ANESTHESIA:   General endotracheal and interscalene block with Exparel  EBL: 100 cc  SPECIMEN: None  Drains: None   PATIENT DISPOSITION:  PACU - hemodynamically stable.    PLAN OF CARE: Admit for overnight observation  Brief history:  Brenda [name] is well-known to our practice having been followed for chronic bilateral shoulder pain for many years.  She is status post a right shoulder anatomic arthroplasty several years ago and has been very well.  She presents with progressive increasing left shoulder pain related to advanced osteoarthritis and due to her increasing pain and functional imitations she is brought to the operating this time for planned left anatomic shoulder arthroplasty  Preoperatively and counseled Brenda [name] regarding treatment options as well as the potential risks versus benefits thereof.  Possible surgical complications were reviewed including bleeding, infection, neurovascular injury, persistence of pain, loss of motion, anesthetic complication, failure the implant, and possible need for additional surgery.  She understands, and accepts, and agrees with our planned procedure.  Procedure in detail:  After undergoing routine preop evaluation patient received prophylactic antibiotics and interscalene block with Exparel was established in the holding area by the anesthesia department.  Patient subsequently placed supine on the operating table and underwent the smooth induction of a general endotracheal anesthesia.  Placed into the beachchair position and appropriately padded and protected.  The left  shoulder girdle region was sterilely prepped and draped in standard fashion.  Timeout was called.  An anterior deltopectoral approach left shoulder is made through an approximately 10 cm incision.  Skin flaps were elevated dissection carried deeply deltopectoral interval was developed from proximal to distal with the vein taken laterally in the upper centimeter of the of the pectoralis major was tenotomized for exposure the conjoined tendon was mobilized and retracted medially and adhesions divided beneath the deltoid.  The long head biceps tendon was then unroofed and tenodesed at the upper border of the pectoralis major tendon and the proximal segment was excised.  We then outlined the insertion of the subscapularis into the lesser tuberosity and split the rotator cuff along the rotator interval and then utilized an oscillating saw to create a lesser tuberosity osteotomy.  The subscapularis was then reflected medially and capsular attachments divided from the anterior and inferior margins of the humeral metaphysis.  The humeral head was then delivered through the wound and extra medullary guide was then used to outline the proposed humeral head resection at approximately 30 degrees retroversion matching the native retroversion this was then completed with an oscillating saw taking care to protect the rotator cuff superiorly and posteriorly.  Large osteophytes on the humeral neck were then removed with rondure and the humeral canal was then prepared hand reaming and then ultimately broaching up to a size 10.  Size 9 implant with metal cap placed into the humeral canal at this point we exposed the glenoid with appropriate retractors gaining complete visualization.  I performed a circumferential labral resection gaining complete visualization the periphery the glenoid we used our glenoid guide to introduce a guidepin into the center of the glenoid and this was then prepared with  a medium reamer and then preparation  completed with our central drill followed by the superior and inferior peg and slot respectively and broaching of the glenoid completed preparation of the trial showed excellent fit.  At this point on the back table cement was mixed and this was then introduced into the superior and inferior peg and slot respectively and the glenoid was then impacted with excellent fit and fixation.  We then returned our attention back to the proximal humerus where the canal was irrigated cleaned and dried our implant was then provisionally introduced and the previously placed sutures for our LTO repair were then shuttled through bone tunnels in the humeral metaphysis that we previously placed in the construct for LTO repair was then established.  The humeral stem was then terminally seated with excellent fit and fixation and the proximal locking screws were then tightened.  At this point a series of trial reductions was then performed and ultimately felt that the 46 x 20 eccentric head gave [place] the best soft tissue balance and good stability.  The trial was removed the [facility] taper was cleaned and dried and the final 46 x 20 eccentric head was then impacted and a final reduction was then performed again showing excellent stability and fixation.  This point we confirmed that the subscapularis was appropriately mobilized with good elasticity.  We repaired the lesser tuberosity osteotomy back to the humeral metaphysis utilizing our standard suture construct involving 2 horizontal and 2 crossing sutures which allowed excellent reapposition and stable fixation.  The rotator cuff interval was then repaired with a series of figure-of-eight suture tape sutures and upon completion the arm easily achieved 45 degrees of external rotation without excessive tension on the subscap repair.  The wound was then copiously irrigated.  Final hemostasis was obtained.  The deltopectoral interval was reapproximated a series of figure-of-eight #1 Vicryl  sutures.  2-0 Vicryl used with subcu layer and intracuticular 3 Monocryl for the skin followed by Dermabond and Aquacel dressing left arm placed into a sling and the patient was then awakened, extubated, and taken to the department in stable addition.  [name], PA-C was used as an Environmental consultant throughout this case essential for help with positioning the patient, positioning extremity, tissue manipulation, implantation of the prosthesis, wound closure, and Intra-Op decision-making.  Metta Clines Barbera Perritt MD   Contact # [phone]

## 2020-01-10 NOTE — Anesthesia Procedure Notes (Signed)
Procedure Name: Intubation Performed by: West Pugh, CRNA Pre-anesthesia Checklist: Patient identified, Emergency Drugs available, Suction available, Patient being monitored and Timeout performed Patient Re-evaluated:Patient Re-evaluated prior to induction Oxygen Delivery Method: Circle system utilized Preoxygenation: Pre-oxygenation with 100% oxygen Induction Type: IV induction Ventilation: Mask ventilation without difficulty Laryngoscope Size: Mac and 3 Grade View: Grade I Tube type: Oral Tube size: 7.0 mm Number of attempts: 1 Airway Equipment and Method: Stylet Placement Confirmation: ETT inserted through vocal cords under direct vision,  positive ETCO2,  CO2 detector and breath sounds checked- equal and bilateral Secured at: 20 cm Tube secured with: Tape Dental Injury: Teeth and Oropharynx as per pre-operative assessment

## 2020-01-10 NOTE — Anesthesia Postprocedure Evaluation (Signed)
Anesthesia Post Note  Patient: Brenda [name]  Procedure(s) Performed: TOTAL SHOULDER ARTHROPLASTY (Left Shoulder)     Patient location during evaluation: PACU Anesthesia Type: General Level of consciousness: awake and alert Pain management: pain level controlled Vital Signs Assessment: post-procedure vital signs reviewed and stable Respiratory status: spontaneous breathing, nonlabored ventilation, respiratory function stable and patient connected to nasal cannula oxygen Cardiovascular status: blood pressure returned to baseline and stable Postop Assessment: no apparent nausea or vomiting Anesthetic complications: no    Last Vitals:  Vitals:   01/10/20 1253 01/10/20 1358  BP: (!) 113/58 110/61  Pulse: (!) 58 62  Resp: 15 15  Temp: 36.4 C (!) 36.4 C  SpO2: 97% 97%    Last Pain:  Vitals:   01/10/20 1053  TempSrc:   PainSc: 0-No pain                 Zyah Gomm S

## 2020-01-10 NOTE — H&P (Signed)
Brenda [name]    Chief Complaint: Left shoulder osteoarthritis HPI: The patient is a 69 y.o. female with chronic and progressively increasing left shoulder pain related to advanced osteoarthritis.  Due to her increasing functional mutations and failure to respond to conservative management, she is brought to the operating room this time for planned left total shoulder arthroplasty  Past Medical History:  Diagnosis Date  . Anxiety   . Arthritis   . CLL (chronic lymphocytic leukemia) (HCC)    Dx. 2019 Dr. Irene Limbo'  . Depression    Wellbutrin  . Diabetes mellitus    Type II  . Endometrial ca ([place])    1998  . 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 GERD (gastroesophageal reflux disease)   . Heart murmur    patient states "cardiologist has not heard heart murmur for past several years"  . Heel spur   . History of hiatal hernia    small  . 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 Hyperkalemia   . Hyperlipidemia   . Hypertension   . Left leg swelling    "swelling in left leg from knee down when sitting for prolonged periods or being on feet for a long period of time"  . PONV (postoperative nausea and vomiting)    after hysterectomy  . Renal insufficiency    Stage 3 b   Dr. Particia Nearing first visit 11-2019    Past Surgical History:  Procedure Laterality Date  . ABDOMINAL HYSTERECTOMY  1998  . COLONOSCOPY    . EYE SURGERY Bilateral    cataracts  . EYE SURGERY Left    retina tear  . FEMUR IM NAIL  10/25/2012   Procedure: INTRAMEDULLARY (IM) NAIL FEMORAL;  Surgeon: Mauri Pole, MD;  Location: [facility];  Service: Orthopedics;  Laterality: Left;  . HIP SURGERY     Fracture L IM nail and 3 rods  . left knee arthroscopy  12/2010  . LYMPH NODE BIOPSY     axillary left 12-2018  . REFRACTIVE SURGERY    . skin cancer removed right and and lower forearm     squamoun in situ  . TOTAL KNEE ARTHROPLASTY  12/20/2011   Procedure: TOTAL KNEE ARTHROPLASTY;  Surgeon: Gearlean Alf, MD;  Location: [facility];  Service: Orthopedics;  Laterality: Left;  Failed attempt at spinal   . TOTAL SHOULDER ARTHROPLASTY Right 08/19/2016   Procedure: RIGHT TOTAL SHOULDER ARTHROPLASTY;  Surgeon: Justice Britain, MD;  Location: [facility];  Service: Orthopedics;  Laterality: Right;    Family History  Problem Relation Age of Onset  . Cancer Mother        breast cancer  . Cancer Father        plasma sarcoma  . Breast cancer Neg Hx     Social History:  reports that she has never smoked. She has never used smokeless tobacco. She reports that she does not drink alcohol or use drugs.   Medications Prior to Admission  Medication Sig Dispense Refill  . acetaminophen (TYLENOL) 500 MG tablet Take 500 mg by mouth every 6 (six) hours as needed.    . ALPRAZolam (XANAX) 1 MG tablet Take 0.5-1 tablets (0.5-1 mg total) by mouth 2 (two) times daily as needed. for anxiety (Patient taking differently: Take 0.5-1 mg by mouth 2 (two) times daily as needed for anxiety. )  60 tablet 5  . aspirin 81 MG tablet Take 81 mg by mouth every other day.     . benazepril-hydrochlorthiazide (LOTENSIN HCT) 20-12.5 MG tablet Take 1 tablet by mouth daily. 90 tablet 3  . buPROPion (WELLBUTRIN XL) 300 MG 24 hr tablet Take 1 tablet (300 mg total) by mouth daily. 90 tablet 2  . Cholecalciferol (VITAMIN D3) 2000 units TABS Take 2,000 mcg by mouth daily.    . Cyanocobalamin (B-12) 1000 MCG SUBL Place 1,000 mcg under the tongue every evening.     Marland Kitchen doxazosin (CARDURA) 2 MG tablet Take 1 tablet (2 mg total) by mouth daily. 30 tablet 3  . esomeprazole (NEXIUM) 20 MG capsule Take 20 mg by mouth daily at 12 noon.    . ezetimibe (ZETIA) 10 MG tablet Take 1 tablet (10 mg total) by mouth daily. (Patient taking differently: Take 100 mg by mouth every evening. ) 90 tablet 3  . gabapentin (NEURONTIN) 300 MG capsule Take 1  capsule (300 mg total) by mouth at bedtime. 90 capsule 3  . glipiZIDE (GLUCOTROL) 5 MG tablet Take 0.5-1 tablets (2.5-5 mg total) by mouth daily before supper. (Patient taking differently: Take 5 mg by mouth daily before supper. ) 90 tablet 3  . Insulin Glargine (LANTUS SOLOSTAR) 100 UNIT/ML Solostar Pen Inject 12 Units into the skin daily. (Patient taking differently: Inject 15 Units into the skin every evening. ) 5 pen 11  . iron polysaccharides (NIFEREX) 150 MG capsule Take 150 mg by mouth at bedtime.     . metoprolol tartrate (LOPRESSOR) 25 MG tablet Take 25 mg by mouth daily.    . NONFORMULARY OR COMPOUNDED ITEM Apply 1 application topically 2 (two) times daily as needed (Sun damage on face). FLUOROURACIL 5% + CALCIPOTRIENE 0.005%    . simvastatin (ZOCOR) 40 MG tablet Take 1 tablet (40 mg total) by mouth at bedtime. 90 tablet 0  . triamcinolone cream (KENALOG) 0.1 % Apply 1 application topically 2 (two) times daily as needed (skin rash.).     Marland Kitchen Blood Glucose Monitoring Suppl (ONETOUCH VERIO) w/Device KIT Use to check blood sugar 3 times a day 1 kit 0  . glucose blood (ONETOUCH VERIO) test strip Use to check blood sugar 3 times a day. 300 each 12  . Insulin Pen Needle 32G X 4 MM MISC Use 1x a day 100 each 3  . Lancet Devices (LANCING DEVICE) MISC Use as advised - Freestyle 1 each 0  . OneTouch Delica Lancets 70Y MISC 1 each by Does not apply route 3 (three) times daily. To check blood sugar 3 times a day 300 each 11     Physical Exam: Left shoulder demonstrates painful and profoundly restricted mobility as noted at her recent office visit.  She demonstrates good motor strength.  Plain radiographs confirm advanced arthritis with complete obliteration of the joint space, subchondral sclerosis, and peripheral osteophyte formation.  Vitals  Temp:  [98 F (36.7 C)] 98 F (36.7 C) (03/11 0547) Pulse Rate:  [63] 63 (03/11 0547) Resp:  [14] 14 (03/11 0547) BP: (100)/(55) 100/55 (03/11  0547) SpO2:  [99 %] 99 % (03/11 0547) Weight:  [85.9 kg] 85.9 kg (03/11 0547)  Assessment/Plan  Impression: Left shoulder osteoarthritis  Plan of Action: Procedure(s): TOTAL SHOULDER ARTHROPLASTY  Shaddai Shapley M Mckynlie Vanderslice 01/10/2020, 6:40 AM Contact # [phone]

## 2020-01-10 NOTE — Progress Notes (Signed)
Neo gtt off

## 2020-01-10 NOTE — Anesthesia Procedure Notes (Signed)
Anesthesia Regional Block: Interscalene brachial plexus block   Pre-Anesthetic Checklist: ,, timeout performed, Correct Patient, Correct Site, Correct Laterality, Correct Procedure, Correct Position, site marked, Risks and benefits discussed,  Surgical consent,  Pre-op evaluation,  At surgeon's request and post-op pain management  Laterality: Left  Prep: chloraprep       Needles:  Injection technique: Single-shot  Needle Type: Echogenic Needle     Needle Length: 9cm      Additional Needles:   Procedures:,,,, ultrasound used (permanent image in chart),,,,  Narrative:  Start time: 01/10/2020 6:53 AM End time: 01/10/2020 7:01 AM Injection made incrementally with aspirations every 5 mL.  Performed by: Personally  Anesthesiologist: Myrtie Soman, MD  Additional Notes: Patient tolerated the procedure well without complications

## 2020-01-11 ENCOUNTER — Encounter: Payer: Self-pay | Admitting: *Deleted

## 2020-01-11 DIAGNOSIS — F419 Anxiety disorder, unspecified: Secondary | ICD-10-CM | POA: Diagnosis not present

## 2020-01-11 DIAGNOSIS — F329 Major depressive disorder, single episode, unspecified: Secondary | ICD-10-CM | POA: Diagnosis not present

## 2020-01-11 DIAGNOSIS — E119 Type 2 diabetes mellitus without complications: Secondary | ICD-10-CM | POA: Diagnosis not present

## 2020-01-11 DIAGNOSIS — M19012 Primary osteoarthritis, left shoulder: Secondary | ICD-10-CM | POA: Diagnosis not present

## 2020-01-11 DIAGNOSIS — E785 Hyperlipidemia, unspecified: Secondary | ICD-10-CM | POA: Diagnosis not present

## 2020-01-11 DIAGNOSIS — I10 Essential (primary) hypertension: Secondary | ICD-10-CM | POA: Diagnosis not present

## 2020-01-11 LAB — GLUCOSE, CAPILLARY: Glucose-Capillary: 161 mg/dL — ABNORMAL HIGH (ref 70–99)

## 2020-01-11 MED ORDER — OXYCODONE-ACETAMINOPHEN 5-325 MG PO TABS: 1.0000 | ORAL_TABLET | ORAL | 0 refills | Status: DC | PRN

## 2020-01-11 MED ORDER — ASPIRIN EC 81 MG PO TBEC
81.0000 mg | DELAYED_RELEASE_TABLET | ORAL | Status: DC
  Administered 2020-01-11: 81 mg via ORAL
  Filled 2020-01-11: qty 1

## 2020-01-11 MED ORDER — ONDANSETRON HCL 4 MG PO TABS: 4.0000 mg | ORAL_TABLET | Freq: Three times a day (TID) | ORAL | 0 refills | Status: DC | PRN

## 2020-01-11 MED ORDER — CYCLOBENZAPRINE HCL 10 MG PO TABS: 10.0000 mg | ORAL_TABLET | Freq: Three times a day (TID) | ORAL | 1 refills | Status: DC | PRN

## 2020-01-11 NOTE — Discharge Summary (Signed)
PATIENT ID:      Brenda [name]  MRN:     [id number] DOB/AGE:    1951-03-11 / 69 y.o.     DISCHARGE SUMMARY  ADMISSION DATE:    01/10/2020 DISCHARGE DATE:    ADMISSION DIAGNOSIS: Left shoulder osteoarthritis Past Medical History:  Diagnosis Date  . Anxiety   . Arthritis   . CLL (chronic lymphocytic leukemia) (HCC)    Dx. 2019 Dr. Irene Limbo'  . Depression    Wellbutrin  . Diabetes mellitus    Type II  . Endometrial ca ([place])    1998  . 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 GERD (gastroesophageal reflux disease)   . Heart murmur    patient states "cardiologist has not heard heart murmur for past several years"  . Heel spur   . History of hiatal hernia    small  . 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 Hyperkalemia   . Hyperlipidemia   . Hypertension   . Left leg swelling    "swelling in left leg from knee down when sitting for prolonged periods or being on feet for a long period of time"  . PONV (postoperative nausea and vomiting)    after hysterectomy  . Renal insufficiency    Stage 3 b   Dr. Particia Nearing first visit 11-2019    DISCHARGE DIAGNOSIS:   Active Problems:   Status post total shoulder arthroplasty, left   PROCEDURE: Procedure(s): TOTAL SHOULDER ARTHROPLASTY on 01/10/2020  CONSULTS:    HISTORY:  See H&P in chart.  HOSPITAL COURSE:  Brenda [name] is a 68 y.o. admitted on 01/10/2020 with a diagnosis of Left shoulder osteoarthritis.  They were brought to the operating room on 01/10/2020 and underwent Procedure(s): TOTAL SHOULDER ARTHROPLASTY.    They were given perioperative antibiotics:  Anti-infectives (From admission, onward)   Start     Dose/Rate Route Frequency Ordered Stop   01/10/20 0600  ceFAZolin (ANCEF) IVPB 2g/100 mL premix     2 g 200 mL/hr over 30 Minutes Intravenous On call to O.R. 01/10/20 0539 01/10/20 0814    01/10/20 0546  ceFAZolin (ANCEF) 2-4 GM/100ML-% IVPB    Note to Pharmacy: Kyra Leyland   : cabinet override      01/10/20 0546 01/10/20 0758    .  Patient underwent the above named procedure and tolerated it well. The following day they were hemodynamically stable and pain was controlled on oral analgesics. They were neurovascularly intact to the operative extremity. OT was ordered and worked with patient per protocol. They were medically and orthopaedically stable for discharge on .    DIAGNOSTIC STUDIES:  RECENT RADIOGRAPHIC STUDIES :  No results found.  RECENT VITAL SIGNS:   Patient Vitals for the past 24 hrs:  BP Temp Temp src Pulse Resp SpO2  01/11/20 0625 (!) 125/50 98.5 F (36.9 C) Oral 94 20 94 %  01/11/20 0107 (!) 111/58 98.2 F (36.8 C) Oral 88 18 92 %  01/10/20 2136 133/67 98.3 F (36.8 C) Oral 86 16 96 %  01/10/20 1716 133/66 98.1 F (36.7 C) - 72 18 97 %  01/10/20 1358 110/61 (!) 97.5 F (36.4 C) - 62 15 97 %  01/10/20 1253 (!) 113/58 97.6 F (36.4 C) - (!) 58 15 97 %  01/10/20 1150 (!) 100/55 97.7 F (36.5 C) - (!)  57 15 98 %  01/10/20 1053 (!) 94/48 (!) 97.5 F (36.4 C) - (!) 56 14 99 %  01/10/20 1042 (!) 97/51 - - (!) 55 - -  01/10/20 1030 (!) 95/51 98.1 F (36.7 C) - (!) 54 12 97 %  01/10/20 1024 (!) 98/51 - - (!) 53 12 98 %  01/10/20 1015 (!) 95/52 - - (!) 54 14 98 %  01/10/20 1000 (!) 94/48 - - (!) 55 12 100 %  01/10/20 0950 (!) 99/55 - - (!) 56 12 100 %  01/10/20 0945 (!) 80/51 - - (!) 56 14 100 %  01/10/20 0942 (!) 90/45 - - (!) 59 16 99 %  01/10/20 0937 (!) 84/39 97.7 F (36.5 C) - (!) 59 (!) 26 99 %  .  RECENT EKG RESULTS:    Orders placed or performed in visit on 10/15/19  . EKG 12-Lead    DISCHARGE INSTRUCTIONS:    DISCHARGE MEDICATIONS:   Allergies as of 01/11/2020      Reactions   Amlodipine    Swelling    Nsaids    Due to kidney function      Medication List    TAKE these medications   acetaminophen 500 MG  tablet Commonly known as: TYLENOL Take 500 mg by mouth every 6 (six) hours as needed.   ALPRAZolam 1 MG tablet Commonly known as: XANAX Take 0.5-1 tablets (0.5-1 mg total) by mouth 2 (two) times daily as needed. for anxiety What changed:   reasons to take this  additional instructions   aspirin 81 MG tablet Take 81 mg by mouth every other day.   B-12 1000 MCG Subl Place 1,000 mcg under the tongue every evening.   benazepril-hydrochlorthiazide 20-12.5 MG tablet Commonly known as: LOTENSIN HCT Take 1 tablet by mouth daily.   buPROPion 300 MG 24 hr tablet Commonly known as: WELLBUTRIN XL Take 1 tablet (300 mg total) by mouth daily.   cyclobenzaprine 10 MG tablet Commonly known as: FLEXERIL Take 1 tablet (10 mg total) by mouth 3 (three) times daily as needed for muscle spasms.   doxazosin 2 MG tablet Commonly known as: CARDURA Take 1 tablet (2 mg total) by mouth daily.   esomeprazole 20 MG capsule Commonly known as: NEXIUM Take 20 mg by mouth daily at 12 noon.   ezetimibe 10 MG tablet Commonly known as: ZETIA Take 1 tablet (10 mg total) by mouth daily. What changed:   how much to take  when to take this   gabapentin 300 MG capsule Commonly known as: NEURONTIN Take 1 capsule (300 mg total) by mouth at bedtime.   glipiZIDE 5 MG tablet Commonly known as: GLUCOTROL Take 0.5-1 tablets (2.5-5 mg total) by mouth daily before supper. What changed: how much to take   Insulin Pen Needle 32G X 4 MM Misc Use 1x a day   iron polysaccharides 150 MG capsule Commonly known as: NIFEREX Take 150 mg by mouth at bedtime.   Lancing Device Misc Use as advised - Freestyle   Lantus SoloStar 100 UNIT/ML Solostar Pen Generic drug: insulin glargine Inject 12 Units into the skin daily. What changed:   how much to take  when to take this   metoprolol tartrate 25 MG tablet Commonly known as: LOPRESSOR Take 25 mg by mouth daily.   NONFORMULARY OR COMPOUNDED ITEM Apply 1  application topically 2 (two) times daily as needed (Sun damage on face). FLUOROURACIL 5% + CALCIPOTRIENE 0.005%   ondansetron 4 MG  tablet Commonly known as: Zofran Take 1 tablet (4 mg total) by mouth every 8 (eight) hours as needed for nausea or vomiting.   OneTouch Delica Lancets 71L Misc 1 each by Does not apply route 3 (three) times daily. To check blood sugar 3 times a day   OneTouch Verio test strip Generic drug: glucose blood Use to check blood sugar 3 times a day.   OneTouch Verio w/Device Kit Use to check blood sugar 3 times a day   oxyCODONE-acetaminophen 5-325 MG tablet Commonly known as: Percocet Take 1 tablet by mouth every 4 (four) hours as needed (max 6 q).   simvastatin 40 MG tablet Commonly known as: ZOCOR Take 1 tablet (40 mg total) by mouth at bedtime.   triamcinolone cream 0.1 % Commonly known as: KENALOG Apply 1 application topically 2 (two) times daily as needed (skin rash.).   Vitamin D3 50 MCG (2000 UT) Tabs Take 2,000 mcg by mouth daily.       FOLLOW UP VISIT:   Follow-up Information    Justice Britain, MD.   Specialty: Orthopedic Surgery Why: in 10-14 days Contact information: [address] [place] [address] [phone]           DISCHARGE YB:RKVT   DISCHARGE CONDITION:  Thereasa Parkin Ishan Sanroman for Dr. Justice Britain 01/11/2020, 7:50 AM

## 2020-01-11 NOTE — Evaluation (Signed)
Occupational Therapy Evaluation Patient Details Name: Brenda [name] MRN: [id number] DOB: [birth year] Today's Date: 01/11/2020    History of Present Illness 69 Year old female s/p L TSA, H/O R TSA   Clinical Impression   Pt was admitted for the above sx. All education was completed. Pt will follow up with Dr Onnie Graham for further rehab needs.    Follow Up Recommendations  Follow surgeon's recommendation for DC plan and follow-up therapies    Equipment Recommendations  None recommended by OT    Recommendations for Other Services       Precautions / Restrictions Precautions Precautions: Shoulder Type of Shoulder Precautions: may come out of sling in controlled environment.  May passively use arm during adls within the following parameters: 20 ER, 45 ABD and 60 FF.  May perform gently pendulums and perform elbow wrist and finger ROM Shoulder Interventions: Shoulder sling/immobilizer Precaution Booklet Issued: Yes (comment) Restrictions Weight Bearing Restrictions: Yes LUE Weight Bearing: Non weight bearing      Mobility Bed Mobility               General bed mobility comments: mod I with use of rail. Pt will sleep in recliner  Transfers Overall transfer level: Independent                    Balance                                           ADL either performed or assessed with clinical judgement   ADL Overall ADL's : Needs assistance/impaired Eating/Feeding: Independent   Grooming: Independent   Upper Body Bathing: Minimal assistance   Lower Body Bathing: Minimal assistance   Upper Body Dressing : Moderate assistance   Lower Body Dressing: Minimal assistance   Toilet Transfer: Independent   Toileting- Clothing Manipulation and Hygiene: Independent         General ADL Comments: performed toileting, dressing, and wrist/finger ROM.  Educated on pendulums but did not perform as arm is still under block.  Reviewed ice machine  and protocol.  Pt verbalizes understanding     Vision         Perception     Praxis      Pertinent Vitals/Pain Pain Assessment: No/denies pain     Hand Dominance (uses both)   Extremity/Trunk Assessment Upper Extremity Assessment Upper Extremity Assessment: (able to move L fingers and wrist; block in effect)           Communication Communication Communication: No difficulties   Cognition Arousal/Alertness: Awake/alert Behavior During Therapy: WFL for tasks assessed/performed Overall Cognitive Status: Within Functional Limits for tasks assessed                                     General Comments       Exercises     Shoulder Instructions      Home Living Family/patient expects to be discharged to:: Private residence Living Arrangements: Alone                 Bathroom Shower/Tub: Walk-in shower             Additional Comments: will have intermittent help.  Pt is very independent natured      Prior Functioning/Environment Level of Independence: Independent  OT Problem List:        OT Treatment/Interventions:      OT Goals(Current goals can be found in the care plan section) Acute Rehab OT Goals OT Goal Formulation: All assessment and education complete, DC therapy  OT Frequency:     Barriers to D/C:            Co-evaluation              AM-PAC OT "6 Clicks" Daily Activity     Outcome Measure Help from another person eating meals?: None Help from another person taking care of personal grooming?: None Help from another person toileting, which includes using toliet, bedpan, or urinal?: None Help from another person bathing (including washing, rinsing, drying)?: A Little Help from another person to put on and taking off regular upper body clothing?: A Lot Help from another person to put on and taking off regular lower body clothing?: A Little 6 Click Score: 20   End of Session    Activity  Tolerance: Patient tolerated treatment well Patient left: in chair;with call bell/phone within reach  OT Visit Diagnosis: Muscle weakness (generalized) (M62.81)                Time: 8676-7209 OT Time Calculation (min): 32 min Charges:  OT General Charges $OT Visit: 1 Visit OT Evaluation $OT Eval Low Complexity: 1 Low OT Treatments $Self Care/Home Management : 8-22 mins  Dwaine Pringle S, OTR/L Acute Rehabilitation Services 01/11/2020  [place] 01/11/2020, 9:57 AM

## 2020-01-17 ENCOUNTER — Encounter: Payer: Self-pay | Admitting: Internal Medicine

## 2020-01-17 LAB — HM DIABETES EYE EXAM

## 2020-01-21 ENCOUNTER — Encounter: Payer: Self-pay | Admitting: Internal Medicine

## 2020-01-23 DIAGNOSIS — Z96612 Presence of left artificial shoulder joint: Secondary | ICD-10-CM | POA: Diagnosis not present

## 2020-01-23 DIAGNOSIS — Z471 Aftercare following joint replacement surgery: Secondary | ICD-10-CM | POA: Diagnosis not present

## 2020-01-25 ENCOUNTER — Other Ambulatory Visit: Payer: Self-pay | Admitting: Cardiovascular Disease

## 2020-02-05 DIAGNOSIS — M25512 Pain in left shoulder: Secondary | ICD-10-CM | POA: Diagnosis not present

## 2020-02-05 DIAGNOSIS — M6281 Muscle weakness (generalized): Secondary | ICD-10-CM | POA: Diagnosis not present

## 2020-02-08 DIAGNOSIS — M6281 Muscle weakness (generalized): Secondary | ICD-10-CM | POA: Diagnosis not present

## 2020-02-08 DIAGNOSIS — M25512 Pain in left shoulder: Secondary | ICD-10-CM | POA: Diagnosis not present

## 2020-02-15 DIAGNOSIS — M25512 Pain in left shoulder: Secondary | ICD-10-CM | POA: Diagnosis not present

## 2020-02-15 DIAGNOSIS — M6281 Muscle weakness (generalized): Secondary | ICD-10-CM | POA: Diagnosis not present

## 2020-02-19 DIAGNOSIS — M6281 Muscle weakness (generalized): Secondary | ICD-10-CM | POA: Diagnosis not present

## 2020-02-19 DIAGNOSIS — M25512 Pain in left shoulder: Secondary | ICD-10-CM | POA: Diagnosis not present

## 2020-02-20 DIAGNOSIS — Z471 Aftercare following joint replacement surgery: Secondary | ICD-10-CM | POA: Diagnosis not present

## 2020-02-20 DIAGNOSIS — Z96612 Presence of left artificial shoulder joint: Secondary | ICD-10-CM | POA: Diagnosis not present

## 2020-02-22 DIAGNOSIS — M6281 Muscle weakness (generalized): Secondary | ICD-10-CM | POA: Diagnosis not present

## 2020-02-22 DIAGNOSIS — M25512 Pain in left shoulder: Secondary | ICD-10-CM | POA: Diagnosis not present

## 2020-02-24 ENCOUNTER — Other Ambulatory Visit: Payer: Self-pay | Admitting: Internal Medicine

## 2020-02-25 ENCOUNTER — Encounter: Payer: Self-pay | Admitting: Internal Medicine

## 2020-02-25 MED ORDER — LANTUS SOLOSTAR 100 UNIT/ML ~~LOC~~ SOPN: 20.0000 [IU] | PEN_INJECTOR | Freq: Every day | SUBCUTANEOUS | 3 refills | Status: DC

## 2020-02-26 DIAGNOSIS — M25512 Pain in left shoulder: Secondary | ICD-10-CM | POA: Diagnosis not present

## 2020-02-26 DIAGNOSIS — M6281 Muscle weakness (generalized): Secondary | ICD-10-CM | POA: Diagnosis not present

## 2020-02-29 DIAGNOSIS — M6281 Muscle weakness (generalized): Secondary | ICD-10-CM | POA: Diagnosis not present

## 2020-02-29 DIAGNOSIS — M25512 Pain in left shoulder: Secondary | ICD-10-CM | POA: Diagnosis not present

## 2020-03-05 ENCOUNTER — Other Ambulatory Visit: Payer: Self-pay

## 2020-03-05 ENCOUNTER — Ambulatory Visit (INDEPENDENT_AMBULATORY_CARE_PROVIDER_SITE_OTHER): Payer: Medicare Other | Admitting: Internal Medicine

## 2020-03-05 ENCOUNTER — Encounter: Payer: Self-pay | Admitting: Internal Medicine

## 2020-03-05 VITALS — BP 110/80 | HR 78 | Ht 59.0 in | Wt 186.0 lb

## 2020-03-05 DIAGNOSIS — E1165 Type 2 diabetes mellitus with hyperglycemia: Secondary | ICD-10-CM

## 2020-03-05 DIAGNOSIS — E782 Mixed hyperlipidemia: Secondary | ICD-10-CM | POA: Diagnosis not present

## 2020-03-05 DIAGNOSIS — Z6835 Body mass index (BMI) 35.0-35.9, adult: Secondary | ICD-10-CM | POA: Diagnosis not present

## 2020-03-05 MED ORDER — LANTUS SOLOSTAR 100 UNIT/ML ~~LOC~~ SOPN: 22.0000 [IU] | PEN_INJECTOR | Freq: Every day | SUBCUTANEOUS | 3 refills | Status: DC

## 2020-03-05 NOTE — Progress Notes (Signed)
Patient ID: Brenda [name], female   DOB: 11-29-1950, 69 y.o.   MRN: [id number]  This visit occurred during the SARS-CoV-2 public health emergency.  Safety protocols were in place, including screening questions prior to the visit, additional usage of staff PPE, and extensive cleaning of exam room while observing appropriate contact time as indicated for disinfecting solutions.   HPI: Brenda [name] is a 69 y.o.-year-old female, presenting for follow-up for DM2, dx in 2007, insulin-dependent, uncontrolled, with complications (CKD). Last visit 4 months ago.   Since last visit, she had shoulder surgery in 12/2019.  [facility]  Reviewed HbA1c levels: Lab Results  Component Value Date   HGBA1C 6.9 (H) 01/10/2020   HGBA1C 6.9 (A) 11/06/2019   HGBA1C 6.3 (A) 07/02/2019   Patient is on: - Lantus 12 units in a.m.- started 05/2019 by PCP >> 20 units, moved to evening - Glipizide 2.5 >> 5 mg >> 2.5-5 mg before a large dinner She was on Glipizide in 11/2015 after a steroid inj. We stopped Victoza due to nausea. Metformin ER was stopped by PCP due to CKD. Glipizide ER was stopped 06/2019 due to low blood sugars.   Pt checks her sugars twice a day: - am:  52, 66-134, 154, 178, 183 >> 119, 128-198 >> 127-173, 180 - 2h after b'fast: 156 >> n/c >> 148 >> n/c - before lunch: n/c >> 75-115 >> 80-169, 197 >> 85-147 - 2h after lunch: 118, 123 >> 92, 99 >> n/c - before dinner: 110-130 >> 60-114 >> 74-149, 151 >> 67, 81-158, 162 - 2h after dinner:n/c >> 138, 148 >> n/c - bedtime: 74, 80-170 >> 91-143 >> n/c - nighttime: n/c Lowest sugar was 37 x1 >> ...  52 >> 74 >> 67 (skipped lunch); she has hypoglycemia awareness in the 70s. Highest sugar was 190 >> 278 (steroids) >> 184 >> 198.  Glucometer: Freestyle Lite  Pt's meals are: - Breakfast: English muffin + preserve or cereal or yoghurt - snack: fruit or fruit + yoghurt - Lunch: 1/2 sandwich +  chips + salsa + fruit/sugar free >> protein bar - Dinner: soup or chicken/steak + veggies, occasional starch or Lean cuisine or light salad She does not drink soft drinks.  -+ CKD, last BUN/creatinine:  Lab Results  Component Value Date   BUN 47 (H) 01/03/2020   CREATININE 1.43 (H) 01/03/2020  On benazepril. Sees nephrology. She had a high potassium (5.9) >> used Kayexalate x3 >> normalized. She is on a low potassium diet. -+ HL; last set of lipids: Lab Results  Component Value Date   CHOL 109 05/30/2019   HDL 31.90 (L) 05/30/2019   LDLCALC 50 05/30/2019   LDLDIRECT 88.0 03/07/2017   TRIG 134.0 05/30/2019   CHOLHDL 3 05/30/2019  On Zocor and Zetia. - last eye exam 03/2019: No DR; she had cataract surgery in 2016.  She has a history of retinal tear. -no numbness and tingling in her feet.  She is on Neurontin for leg cramps at night.  She also has HTN, GERD, depression.  She was admitted 11/2017 for dehydration, anemia, and acute renal failure >> diagnosed with CLL.  ROS: Constitutional: no weight gain/no weight loss, no fatigue, no subjective hyperthermia, no subjective hypothermia Eyes: no blurry vision, no xerophthalmia ENT: no sore throat, no nodules palpated in neck, no dysphagia, no odynophagia, no hoarseness Cardiovascular: no CP/no SOB/no palpitations/no leg swelling Respiratory: no cough/no SOB/no wheezing Gastrointestinal:  no N/no V/no D/no C/no acid reflux Musculoskeletal: no muscle aches/no joint aches Skin: no rashes, no hair loss Neurological: no tremors/no numbness/no tingling/no dizziness  I reviewed pt's medications, allergies, PMH, social hx, family hx, and changes were documented in the history of present illness. Otherwise, unchanged from my initial visit note.  Past Medical History:  Diagnosis Date  . Anxiety   . Arthritis   . CLL (chronic lymphocytic leukemia) (HCC)    Dx. 2019 Dr. Irene Limbo'  . Depression    Wellbutrin  . Diabetes mellitus    Type II   . Endometrial ca ([place])    1998  . 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 GERD (gastroesophageal reflux disease)   . Heart murmur    patient states "cardiologist has not heard heart murmur for past several years"  . Heel spur   . History of hiatal hernia    small  . 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 Hyperkalemia   . Hyperlipidemia   . Hypertension   . Left leg swelling    "swelling in left leg from knee down when sitting for prolonged periods or being on feet for a long period of time"  . PONV (postoperative nausea and vomiting)    after hysterectomy  . Renal insufficiency    Stage 3 b   Dr. Particia Nearing first visit 11-2019   Past Surgical History:  Procedure Laterality Date  . ABDOMINAL HYSTERECTOMY  1998  . COLONOSCOPY    . EYE SURGERY Bilateral    cataracts  . EYE SURGERY Left    retina tear  . FEMUR IM NAIL  10/25/2012   Procedure: INTRAMEDULLARY (IM) NAIL FEMORAL;  Surgeon: Mauri Pole, MD;  Location: [facility];  Service: Orthopedics;  Laterality: Left;  . HIP SURGERY     Fracture L IM nail and 3 rods  . left knee arthroscopy  12/2010  . LYMPH NODE BIOPSY     axillary left 12-2018  . REFRACTIVE SURGERY    . skin cancer removed right and and lower forearm     squamoun in situ  . TOTAL KNEE ARTHROPLASTY  12/20/2011   Procedure: TOTAL KNEE ARTHROPLASTY;  Surgeon: Gearlean Alf, MD;  Location: [facility];  Service: Orthopedics;  Laterality: Left;  Failed attempt at spinal   . TOTAL SHOULDER ARTHROPLASTY Right 08/19/2016   Procedure: RIGHT TOTAL SHOULDER ARTHROPLASTY;  Surgeon: Justice Britain, MD;  Location: [place];  Service: Orthopedics;  Laterality: Right;  . TOTAL SHOULDER ARTHROPLASTY Left 01/10/2020   Procedure: TOTAL SHOULDER ARTHROPLASTY;  Surgeon: Justice Britain, MD;  Location: [facility];  Service: Orthopedics;  Laterality: Left;  153mn   Social  History   Social History  . Marital Status: Single    Spouse Name: N/A  . Number of Children: 0   Occupational History  . [place] [employer]  . Smoking status: Never Smoker   . Smokeless tobacco: Never Used  . Alcohol Use: No  . 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 acetaminophen (TYLENOL) 500 MG tablet Take 500 mg by mouth every 6 (six) hours as needed.    . ALPRAZolam (XANAX) 1 MG tablet  Take 0.5-1 tablets (0.5-1 mg total) by mouth 2 (two) times daily as needed. for anxiety (Patient taking differently: Take 0.5-1 mg by mouth 2 (two) times daily as needed for anxiety. ) 60 tablet 5  . aspirin 81 MG tablet Take 81 mg by mouth every other day.     . benazepril-hydrochlorthiazide (LOTENSIN HCT) 20-12.5 MG tablet Take 1 tablet by mouth daily. 90 tablet 3  . Blood Glucose Monitoring Suppl (ONETOUCH VERIO) w/Device KIT Use to check blood sugar 3 times a day 1 kit 0  . buPROPion (WELLBUTRIN XL) 300 MG 24 hr tablet Take 1 tablet (300 mg total) by mouth daily. 90 tablet 2  . Cholecalciferol (VITAMIN D3) 2000 units TABS Take 2,000 mcg by mouth daily.    . Cyanocobalamin (B-12) 1000 MCG SUBL Place 1,000 mcg under the tongue every evening.     . cyclobenzaprine (FLEXERIL) 10 MG tablet Take 1 tablet (10 mg total) by mouth 3 (three) times daily as needed for muscle spasms. 30 tablet 1  . doxazosin (CARDURA) 2 MG tablet TAKE 1 TABLET(2 MG) BY MOUTH DAILY 30 tablet 3  . esomeprazole (NEXIUM) 20 MG capsule Take 20 mg by mouth daily at 12 noon.    . ezetimibe (ZETIA) 10 MG tablet Take 1 tablet (10 mg total) by mouth daily. (Patient taking differently: Take 100 mg by mouth every evening. ) 90 tablet 3  . gabapentin (NEURONTIN) 300 MG capsule Take 1 capsule (300 mg total) by mouth at bedtime. 90 capsule 3  . glipiZIDE (GLUCOTROL XL) 2.5 MG 24 hr tablet TAKE  1 TABLET BY MOUTH ONCE A DAY BEFORE BREAKFAST 90 tablet 3  . glipiZIDE (GLUCOTROL) 5 MG tablet TAKE 1 TABLET(5 MG) BY MOUTH DAILY BEFORE SUPPER 90 tablet 3  . glucose blood (ONETOUCH VERIO) test strip Use to check blood sugar 3 times a day. 300 each 12  . insulin glargine (LANTUS SOLOSTAR) 100 UNIT/ML Solostar Pen Inject 20 Units into the skin at bedtime. 18 mL 3  . Insulin Pen Needle 32G X 4 MM MISC Use 1x a day 100 each 3  . iron polysaccharides (NIFEREX) 150 MG capsule Take 150 mg by mouth at bedtime.     Elmore Guise Devices (LANCING DEVICE) MISC Use as advised - Freestyle 1 each 0  . metoprolol tartrate (LOPRESSOR) 25 MG tablet Take 25 mg by mouth daily.    . NONFORMULARY OR COMPOUNDED ITEM Apply 1 application topically 2 (two) times daily as needed (Sun damage on face). FLUOROURACIL 5% + CALCIPOTRIENE 0.005%    . ondansetron (ZOFRAN) 4 MG tablet Take 1 tablet (4 mg total) by mouth every 8 (eight) hours as needed for nausea or vomiting. 10 tablet 0  . OneTouch Delica Lancets 16X MISC 1 each by Does not apply route 3 (three) times daily. To check blood sugar 3 times a day 300 each 11  . oxyCODONE-acetaminophen (PERCOCET) 5-325 MG tablet Take 1 tablet by mouth every 4 (four) hours as needed (max 6 q). 20 tablet 0  . simvastatin (ZOCOR) 40 MG tablet Take 1 tablet (40 mg total) by mouth at bedtime. 90 tablet 0  . triamcinolone cream (KENALOG) 0.1 % Apply 1 application topically 2 (two) times daily as needed (skin rash.).      No current facility-administered medications on file prior to visit.   Allergies  Allergen Reactions  . Amlodipine     Swelling    . Nsaids     Due to kidney function  Family History  Problem Relation Age of Onset  . Cancer Mother        breast cancer  . Cancer Father        plasma sarcoma  . Breast cancer Neg Hx    PE: BP 110/80   Pulse 78   Ht 4' 11"  (1.499 m)   Wt 186 lb (84.4 kg)   SpO2 97%   BMI 37.57 kg/m  Body mass index is 37.57 kg/m. Wt Readings  from Last 3 Encounters:  03/05/20 186 lb (84.4 kg)  01/10/20 189 lb 6 oz (85.9 kg)  01/03/20 189 lb 6 oz (85.9 kg)   Constitutional: overweight, in NAD Eyes: PERRLA, EOMI, no exophthalmos ENT: moist mucous membranes, no thyromegaly, no cervical lymphadenopathy Cardiovascular: RRR, No MRG, + LLE-chronic Respiratory: CTA B Gastrointestinal: abdomen soft, NT, ND, BS+ Musculoskeletal: no deformities, strength intact in all 4 Skin: moist, warm, no rashes Neurological: no tremor with outstretched hands, DTR normal in all 4  ASSESSMENT: 1. DM2, now insulin-dependent, uncontrolled, with complications  - mild CKD  2. Obesity class 2  3. HL  PLAN:  1. Patient with longstanding previously uncontrolled diabetes, with much improved control since summer 2020 due to the addition of insulin.  At that time, Metformin had to be stopped due to worsening kidney function.  HbA1c decreased from 8.1% to 6.3% and then increased to 6.9% at last visit.  Latest HbA1c from 2 months ago was 6.9%, stable.  At last visit, we had to decrease her glipizide due to low blood sugars in the 50s. -In the past, she was on Victoza, but this was stopped due to nausea.  We discussed about using a GLP-1 receptor agonist, but did not do so yet. -At last visit, sugars were worse over the holidays, especially in the morning, so we moved to Lantus after dinner. -At this visit, sugars are better in the morning, but still many of them above target, despite increasing the dose of Lantus and moving it at that time.  We discussed about ways to improve her sugars in the morning including starting walking in the afternoon/evening, for 30 minutes, but also increasing Lantus.  We will try both.  She is reticent to try GLP-1 receptor agonist due to previous side effect of Victoza (please see above). - I suggested to:  Patient Instructions  Please increase: - Lantus to 22-24 units after dinner  Continue: - Glipizide 2.5-5 mg before a  larger dinner  Check some sugars at bedtime or 2h after dinner.  Try to walk for at least 30 min a day.  Please return in 3-4 months with your sugar log.  - advised to check sugars at different times of the day - 1x a day, rotating check times - advised for yearly eye exams >> she is UTD - return to clinic in 3-4 months     2. Obesity class 2 -Unfortunately, she continues on insulin and glipizide which are both conducive to weight gain, but we were able to decrease the dose of glipizide at last visit -She is having problems choosing the right foods to eat since she has to also limit potassium. -lost 2 lbs since last OV  3.  Hyperlipidemia -Reviewed latest lipid panel from 05/2019: LDL excellent, at goal, HDL slightly low Lab Results  Component Value Date   CHOL 109 05/30/2019   HDL 31.90 (L) 05/30/2019   LDLCALC 50 05/30/2019   LDLDIRECT 88.0 03/07/2017   TRIG 134.0 05/30/2019   CHOLHDL  3 05/30/2019  -Continues on simvastatin and Zetia without side effects  Philemon Kingdom, MD PhD [facility] Endocrinology

## 2020-03-05 NOTE — Patient Instructions (Signed)
Please increase: - Lantus to 22-24 units after dinner  Continue: - Glipizide 2.5-5 mg before a larger dinner  Check some sugars at bedtime or 2h after dinner.  Try to walk for at least 30 min a day.  Please return in 3-4 months with your sugar log.

## 2020-03-07 DIAGNOSIS — M25512 Pain in left shoulder: Secondary | ICD-10-CM | POA: Diagnosis not present

## 2020-03-07 DIAGNOSIS — M6281 Muscle weakness (generalized): Secondary | ICD-10-CM | POA: Diagnosis not present

## 2020-03-14 DIAGNOSIS — M6281 Muscle weakness (generalized): Secondary | ICD-10-CM | POA: Diagnosis not present

## 2020-03-14 DIAGNOSIS — M25512 Pain in left shoulder: Secondary | ICD-10-CM | POA: Diagnosis not present

## 2020-03-18 DIAGNOSIS — Z961 Presence of intraocular lens: Secondary | ICD-10-CM | POA: Diagnosis not present

## 2020-03-18 DIAGNOSIS — E119 Type 2 diabetes mellitus without complications: Secondary | ICD-10-CM | POA: Diagnosis not present

## 2020-03-18 DIAGNOSIS — H33012 Retinal detachment with single break, left eye: Secondary | ICD-10-CM | POA: Diagnosis not present

## 2020-04-01 ENCOUNTER — Other Ambulatory Visit: Payer: Self-pay

## 2020-04-01 ENCOUNTER — Telehealth: Payer: Self-pay | Admitting: General Practice

## 2020-04-01 MED ORDER — BENAZEPRIL-HYDROCHLOROTHIAZIDE 20-12.5 MG PO TABS: 1.0000 | ORAL_TABLET | Freq: Every day | ORAL | 0 refills | Status: DC

## 2020-04-01 NOTE — Telephone Encounter (Signed)
Patient is calling and requesting a refill for benazepril- hydrochlorthiazide sne to [facility] in [place]. Patient has a TOC with Dr. Bryan Lemma on 7/15. CB is [phone]

## 2020-04-01 NOTE — Telephone Encounter (Signed)
Rx sent today.

## 2020-04-01 NOTE — Telephone Encounter (Signed)
Last Fill 04/04/2019 Next OV 05/15/20

## 2020-04-02 DIAGNOSIS — Z471 Aftercare following joint replacement surgery: Secondary | ICD-10-CM | POA: Diagnosis not present

## 2020-04-02 DIAGNOSIS — Z96612 Presence of left artificial shoulder joint: Secondary | ICD-10-CM | POA: Diagnosis not present

## 2020-05-02 ENCOUNTER — Other Ambulatory Visit: Payer: Self-pay | Admitting: Family Medicine

## 2020-05-02 NOTE — Telephone Encounter (Signed)
Last fill 04/01/20  #30/0 Next OV 05/15/20

## 2020-05-07 NOTE — Progress Notes (Signed)
HEMATOLOGY/ONCOLOGY CLINIC NOTE  Date of Service: 05/12/20    Patient Care Team: Patient, No Pcp Per as PCP - General (General Practice) Rockey Situ, Kathlene November, MD as PCP - Cardiology (Cardiology) Rockey Situ Kathlene November, MD as Consulting Physician (Cardiology)  CHIEF COMPLAINTS/PURPOSE OF CONSULTATION:  F/u for continue mx of CLL  HISTORY OF PRESENTING ILLNESS:   Brenda [name] is a wonderful 69 y.o. female who was hospitalized on 11/17/17 for abdominal discomfort, nausea and anorexia.   Further workup showed elevated kidney function tests and CT of the abd and pelvis demonstrated pelvic lymphadenopathy and innumerable pulmonary nodulesin the bases concerning for metastases.  Today she is here for an outpatient follow up accompanied by her friend. She notes she has not felt her usual self in the past 2 months with fatigue and lack of appetite. She also noticed diarrhea that comes and goes and her energy level would continue to drop. She has lost 25-30 pounds since end of summer 2018. She will notice mucous with her stool but no bloody or black stool. She does not take any meds that she would associate with her diarrhea or lower appetite and she denies change in her chronic medications. She has been on Nexium for a long period of time. She notes she does not often get sick. Dr. Deborra Medina is her PCP. She was using 271m ibuprofen 2-3 times a day more often recently due to her hip pain.   She notes her mother had breast cancer and diagnosed at 565yo. Her father had a rare form of cancer who died 3 weeks after diagnosis in his 861s Her brother passed from non-alcohol liver cirrhosis.   On review of symptoms, pt notes since her discharge she no longer has abdominal pain but will still have occasional diarrhea. She has not had diarrhea in several days. She notes to having a slight cough but mostly sinus drainage. Her appetite and eating has improved. She notes due to her knee replacement and hip fracture  she will have swelling in her left leg. She has lower back pain at times. She denies feeling lumps or bumps in her axilla b/l or groin. She denies dysuria or change in her urination.   Interval History:  Brenda SANBORNreturns today regarding her Chronic Lymphocytic Leukemia. The patient's last visit with [facility] on 11/12/2019. The pt reports that she is doing well overall.  The pt reports she had a Left Shoulder Arthroplasty in March. Pt has been having back pain for which she has tried Tramadol in the past, which did not help. She continues walking to remain active. Her previous PCP left the practice and she last saw him in February. She will see her new PCP, Dr. MLetta Median later this week. Pt saw a Nephrologist to evaluate her CKD and hyperkalemia. Pt will follow up with her Nephrologist in August. She recently lost a close friend of hers unexpectedly and is experiencing emotional distress.   Lab results today (05/12/20) of CBC w/diff and CMP is as follows: all values are WNL except for Hgb at 11.9, PLT at 132K, Lymphs Abs at 4.6K, Glucose at 147, BUN at 36, Creatinine at 1.67, Total Protein at 6.3, GFR Est Non Af Am at 30. 05/12/2020 LDH at 176  On review of systems, pt reports back pain, depressed mood and denies fevers, chills, night sweats, unexpected weight loss, bowel habit changes, urinary habit changes, abdominal pain, rashes, new lumps/bumps and any other symptoms.   MEDICAL  HISTORY:  Past Medical History:  Diagnosis Date  . Anxiety   . Arthritis   . CLL (chronic lymphocytic leukemia) (HCC)    Dx. 2019 Dr. Irene Limbo'  . Depression    Wellbutrin  . Diabetes mellitus    Type II  . Endometrial ca ([place])    1998  . 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 GERD (gastroesophageal reflux disease)   . Heart murmur    patient states "cardiologist has not heard heart murmur for past several years"  . Heel spur   . History of hiatal hernia     small  . 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 Hyperkalemia   . Hyperlipidemia   . Hypertension   . Left leg swelling    "swelling in left leg from knee down when sitting for prolonged periods or being on feet for a long period of time"  . PONV (postoperative nausea and vomiting)    after hysterectomy  . Renal insufficiency    Stage 3 b   Dr. Particia Nearing first visit 11-2019    SURGICAL HISTORY: Past Surgical History:  Procedure Laterality Date  . ABDOMINAL HYSTERECTOMY  1998  . COLONOSCOPY    . EYE SURGERY Bilateral    cataracts  . EYE SURGERY Left    retina tear  . FEMUR IM NAIL  10/25/2012   Procedure: INTRAMEDULLARY (IM) NAIL FEMORAL;  Surgeon: Mauri Pole, MD;  Location: [facility];  Service: Orthopedics;  Laterality: Left;  . HIP SURGERY     Fracture L IM nail and 3 rods  . left knee arthroscopy  12/2010  . LYMPH NODE BIOPSY     axillary left 12-2018  . REFRACTIVE SURGERY    . skin cancer removed right and and lower forearm     squamoun in situ  . TOTAL KNEE ARTHROPLASTY  12/20/2011   Procedure: TOTAL KNEE ARTHROPLASTY;  Surgeon: Gearlean Alf, MD;  Location: [facility];  Service: Orthopedics;  Laterality: Left;  Failed attempt at spinal   . TOTAL SHOULDER ARTHROPLASTY Right 08/19/2016   Procedure: RIGHT TOTAL SHOULDER ARTHROPLASTY;  Surgeon: Justice Britain, MD;  Location: [place];  Service: Orthopedics;  Laterality: Right;  . TOTAL SHOULDER ARTHROPLASTY Left 01/10/2020   Procedure: TOTAL SHOULDER ARTHROPLASTY;  Surgeon: Justice Britain, MD;  Location: [facility];  Service: Orthopedics;  Laterality: Left;  143mn    SOCIAL HISTORY: Social History   Socioeconomic History  . Marital status: Single    Spouse name: Not on file  . Number of children: Not on file  . Years of education: Not on file  . Highest education level: Not on file  Occupational History  . Occupation: RTherapist, [place] Tobacco Use  . Smoking status: Never Smoker  . Smokeless tobacco: Never Used  Vaping Use  . Vaping Use: Never used  Substance and Sexual Activity  . Alcohol use: No  . Drug use: No  . Sexual activity: Not Currently  Other Topics Concern  . Not on file  Social History Narrative   [facility] [employer]            Social Determinants of Health   Financial Resource Strain:   . Difficulty of Paying Living Expenses:   Food Insecurity:   . Worried About  Running Out of Food in the Last Year:   . [place] in the Last Year:   Transportation Needs:   . Lack of Transportation (Medical):   Marland Kitchen Lack of Transportation (Non-Medical):   Physical Activity:   . Days of Exercise per Week:   . Minutes of Exercise per Session:   Stress:   . Feeling of Stress :   Social Connections:   . Frequency of Communication with Friends and Family:   . Frequency of Social Gatherings with Friends and Family:   . Attends Religious Services:   . Active Member of Clubs or Organizations:   . Attends Archivist Meetings:   Marland Kitchen Marital Status:   Intimate Partner Violence:   . Fear of Current or Ex-Partner:   . Emotionally Abused:   Marland Kitchen Physically Abused:   . Sexually Abused:     FAMILY HISTORY: Family History  Problem Relation Age of Onset  . Cancer Mother        breast cancer  . Cancer Father        plasma sarcoma  . Breast cancer Neg Hx     ALLERGIES:  is allergic to amlodipine and nsaids.  MEDICATIONS:  Current Outpatient Medications  Medication Sig Dispense Refill  . acetaminophen (TYLENOL) 500 MG tablet Take 500 mg by mouth every 6 (six) hours as needed.    . ALPRAZolam (XANAX) 1 MG tablet Take 0.5-1 tablets (0.5-1 mg total) by mouth 2 (two) times daily as needed. for anxiety (Patient taking differently: Take 0.5-1 mg by mouth 2 (two) times daily as needed for anxiety. ) 60 tablet 5  . aspirin 81 MG tablet Take 81 mg by mouth every other day.     .  benazepril-hydrochlorthiazide (LOTENSIN HCT) 20-12.5 MG tablet TAKE 1 TABLET BY MOUTH DAILY 30 tablet 0  . Blood Glucose Monitoring Suppl (ONETOUCH VERIO) w/Device KIT Use to check blood sugar 3 times a day 1 kit 0  . buPROPion (WELLBUTRIN XL) 300 MG 24 hr tablet Take 1 tablet (300 mg total) by mouth daily. 90 tablet 2  . Cholecalciferol (VITAMIN D3) 2000 units TABS Take 2,000 mcg by mouth daily.    . Cyanocobalamin (B-12) 1000 MCG SUBL Place 1,000 mcg under the tongue every evening.     . cyclobenzaprine (FLEXERIL) 10 MG tablet Take 1 tablet (10 mg total) by mouth 3 (three) times daily as needed for muscle spasms. 30 tablet 1  . doxazosin (CARDURA) 2 MG tablet TAKE 1 TABLET(2 MG) BY MOUTH DAILY 30 tablet 3  . esomeprazole (NEXIUM) 20 MG capsule Take 20 mg by mouth daily at 12 noon.    . ezetimibe (ZETIA) 10 MG tablet Take 1 tablet (10 mg total) by mouth daily. (Patient taking differently: Take 100 mg by mouth every evening. ) 90 tablet 3  . gabapentin (NEURONTIN) 300 MG capsule Take 1 capsule (300 mg total) by mouth at bedtime. 90 capsule 3  . glipiZIDE (GLUCOTROL XL) 2.5 MG 24 hr tablet TAKE 1 TABLET BY MOUTH ONCE A DAY BEFORE BREAKFAST 90 tablet 3  . glipiZIDE (GLUCOTROL) 5 MG tablet TAKE 1 TABLET(5 MG) BY MOUTH DAILY BEFORE SUPPER 90 tablet 3  . glucose blood (ONETOUCH VERIO) test strip Use to check blood sugar 3 times a day. 300 each 12  . insulin glargine (LANTUS SOLOSTAR) 100 UNIT/ML Solostar Pen Inject 22-24 Units into the skin at bedtime. 18 mL 3  . Insulin Pen Needle 32G X 4 MM MISC Use 1x  a day 100 each 3  . iron polysaccharides (NIFEREX) 150 MG capsule Take 150 mg by mouth at bedtime.     Elmore Guise Devices (LANCING DEVICE) MISC Use as advised - Freestyle 1 each 0  . metoprolol tartrate (LOPRESSOR) 25 MG tablet Take 25 mg by mouth daily.    . NONFORMULARY OR COMPOUNDED ITEM Apply 1 application topically 2 (two) times daily as needed (Sun damage on face). FLUOROURACIL 5% + CALCIPOTRIENE  0.005%    . ondansetron (ZOFRAN) 4 MG tablet Take 1 tablet (4 mg total) by mouth every 8 (eight) hours as needed for nausea or vomiting. 10 tablet 0  . OneTouch Delica Lancets 53Z MISC 1 each by Does not apply route 3 (three) times daily. To check blood sugar 3 times a day 300 each 11  . oxyCODONE-acetaminophen (PERCOCET) 5-325 MG tablet Take 1 tablet by mouth every 4 (four) hours as needed (max 6 q). 20 tablet 0  . simvastatin (ZOCOR) 40 MG tablet Take 1 tablet (40 mg total) by mouth at bedtime. 90 tablet 0  . triamcinolone cream (KENALOG) 0.1 % Apply 1 application topically 2 (two) times daily as needed (skin rash.).      No current facility-administered medications for this visit.    REVIEW OF SYSTEMS:   A 10+ POINT REVIEW OF SYSTEMS WAS OBTAINED including neurology, dermatology, psychiatry, cardiac, respiratory, lymph, extremities, GI, GU, Musculoskeletal, constitutional, breasts, reproductive, HEENT.  All pertinent positives are noted in the HPI.  All others are negative.   PHYSICAL EXAMINATION: ECOG PERFORMANCE STATUS: 1 - Symptomatic but completely ambulatory  Vitals:   05/12/20 0918  BP: 109/61  Pulse: 73  Resp: 18  Temp: 98.1 F (36.7 C)  SpO2: 96%   Filed Weights   05/12/20 0918  Weight: 190 lb 14.4 oz (86.6 kg)   .Body mass index is 38.56 kg/m.  GENERAL:alert, in no acute distress and comfortable SKIN: no acute rashes, no significant lesions EYES: conjunctiva are pink and non-injected, sclera anicteric OROPHARYNX: MMM, no exudates, no oropharyngeal erythema or ulceration NECK: supple, no JVD LYMPH:  no palpable lymphadenopathy in the axillary or inguinal regions. Minimal cervical lymphadenopathy, unchanged.  LUNGS: clear to auscultation b/l with normal respiratory effort HEART: regular rate & rhythm ABDOMEN:  normoactive bowel sounds , non tender, not distended. No palpable hepatosplenomegaly.  Extremity: no pedal edema PSYCH: alert & oriented x 3 with fluent  speech NEURO: no focal motor/sensory deficits  LABORATORY DATA:  I have reviewed the data as listed  . CBC Latest Ref Rng & Units 05/12/2020 01/03/2020 11/12/2019  WBC 4.0 - 10.5 K/uL 8.9 10.2 8.8  Hemoglobin 12.0 - 15.0 g/dL 11.9(L) 12.6 13.3  Hematocrit 36 - 46 % 37.1 39.6 41.7  Platelets 150 - 400 K/uL 132(L) 144(L) 156  hgb 10.7 ANC 3.2k .Marland Kitchen CBC    Component Value Date/Time   WBC 8.9 05/12/2020 0855   WBC 10.2 01/03/2020 1123   RBC 4.19 05/12/2020 0855   HGB 11.9 (L) 05/12/2020 0855   HCT 37.1 05/12/2020 0855   PLT 132 (L) 05/12/2020 0855   MCV 88.5 05/12/2020 0855   MCH 28.4 05/12/2020 0855   MCHC 32.1 05/12/2020 0855   RDW 12.3 05/12/2020 0855   LYMPHSABS 4.6 (H) 05/12/2020 0855   MONOABS 0.7 05/12/2020 0855   EOSABS 0.2 05/12/2020 0855   BASOSABS 0.1 05/12/2020 0855    CMP Latest Ref Rng & Units 05/12/2020 01/03/2020 11/12/2019  Glucose 70 - 99 mg/dL 147(H) 165(H) 122(H)  BUN  8 - 23 mg/dL 36(H) 47(H) 37(H)  Creatinine 0.44 - 1.00 mg/dL 1.67(H) 1.43(H) 1.43(H)  Sodium 135 - 145 mmol/L 140 138 139  Potassium 3.5 - 5.1 mmol/L 4.5 4.8 5.1  Chloride 98 - 111 mmol/L 108 104 104  CO2 22 - 32 mmol/L _0 Calcium 8.9 - 10.3 mg/dL 9.0 9.4 9.7  Total Protein 6.5 - 8.1 g/dL 6.3(L) - 7.0  Total Bilirubin 0.3 - 1.2 mg/dL 0.8 - 0.6  Alkaline Phos 38 - 126 U/L 74 - 98  AST 15 - 41 U/L 18 - 14(L)  ALT 0 - 44 U/L 15 - 13     12/15/17 FISH CLL Prognostic Panel:    01/03/19 Left axillary LN biopsy:    RADIOGRAPHIC STUDIES: I have personally reviewed the radiological images as listed and agreed with the findings in the report. No results found.  ASSESSMENT & PLAN:  Brenda [name] is a 69 y.o. 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 Lab Results  Component Value Date   LDH 176 05/12/2020   05/01/18 CT C/A/P revealed Mildly progressive lymphadenopathy in the chest, abdomen, and pelvis, corresponding to the patient's known CLL, as above. Dominant left external iliac node measures 3.3 cm short axis, previously 2.8cm. Spleen is normal in size. Numerous bilateral pulmonary nodules measuring up to 9 mm, grossly unchanged from 2017. Prior bilateral pleural effusions have resolved.   09/22/18 [place] Bilateral Axilla revealed Ultrasound is performed, showing numerous enlarged LEFT axillary lymph nodes. Largest lymph node in the LEFT axilla has cortical thickening of 9 millimeters. Largest lymph node is 3.3 centimeters in length. Evaluation of the contralateral axilla for comparison demonstrate lymph nodes with cortical thickening. Largest lymph node in the RIGHT axilla is 3.4 x 1.3 centimeters.  01/03/19 Left axillary LN biopsy revealed CLL  2. Multiple pulmonary nodules   CT AP on 11/17/17 - with 1. Pelvic lymphadenopathy and innumerable pulmonary nodules in the lung bases, consistent with metastatic disease. 2. Cholelithiasis without CT evidence of acute cholecystitis. 3.  Aortic atherosclerosis  CT Chest on 11/20/17 - with multiple small lung nodules in LLL that appear stable from prior CT in 12/2015 and are considered benign and Upper lung nodules also appear benign. Also with several borderline enlarged lymph nodes concerning for a lymphoproliferative disorder   3. H/o endometrial cancer in 1998 -localized to uterus -treated with surgery, abdominal hysterectomy  -no evidence of recurrence  4. Microcytic anemia - Fe deficiency  Presented in hospital with Hgb of 6.6 Hgb holding steady s/p 2U PRBC - Fe infusioncompleted 1/19- stool hemoccult negative - suspect this may be due to CKD, but iron deficiency also worrisome for occult GIB  -Tolerated IV iron well  . Lab Results  Component Value Date   IRON 88  11/10/2018   TIBC 403 11/10/2018   IRONPCTSAT 22 11/10/2018   (Iron and TIBC)  Lab Results  Component Value Date   FERRITIN 78 11/12/2019    5. Marginal B12 levels in hospital  (Antiparietal celll and anti IF Ab negative) -given one B12 injection in hospital -on replacement PLAN:  -Discussed pt labwork today, 05/12/20; minimal anemia & thromocytopenia, WBC are nml, chronic kidney dysfunction, LDH is WNL -No lab or clinical evidence of CLL progression at  this time.  -Recommend pt continue skin screening with a dermatologist every 6 mo to 1 yr.  -Recommended that the pt continue to eat well, drink at least 48-64 oz of water each day, and walk 20-30 minutes each day.  -Pt has a new PCP - Mary Cirigliano, DO. Recommend pt f/u as scheduled. May consider alternating visits if CLL remains stable.  -Continue on 46m aspirin, SL1000 mcg Vitamin B12, &  PO 1556mIron Polysaccharide daily -Recommend pt continue to f/u with Nephrology.  -Will see the pt back in 6 months with labs    FOLLOW UP: RTC with Dr KaIrene Limboith labs in 6 months   The total time spent in the appt was 20 minutes and more than 50% was on counseling and direct patient cares.  All of the patient's questions were answered with apparent satisfaction. The patient knows to call the clinic with any problems, questions or concerns.  GaSullivan LoneD MSKewaneeAHIVMS SCSanta Rosa Medical CenterTMorton Plant [facility] Centerematology/Oncology Physician CoDay Surgery Of Grand [facility]unction(Office):       [phone] cell):  [phone]):           [phone]/10/2020 10:22 AM  I, JaYevette Edwardsam acting as a scribe for Dr. GaSullivan Lone  .I have reviewed the above documentation for accuracy and completeness, and I agree with the above. .GBrunetta GeneraD

## 2020-05-12 ENCOUNTER — Other Ambulatory Visit: Payer: Self-pay

## 2020-05-12 ENCOUNTER — Inpatient Hospital Stay: Payer: Medicare Other | Attending: Hematology | Admitting: Hematology

## 2020-05-12 ENCOUNTER — Other Ambulatory Visit: Payer: Self-pay | Admitting: *Deleted

## 2020-05-12 ENCOUNTER — Inpatient Hospital Stay: Payer: Medicare Other

## 2020-05-12 ENCOUNTER — Telehealth: Payer: Self-pay | Admitting: Hematology

## 2020-05-12 VITALS — BP 109/61 | HR 73 | Temp 98.1°F | Resp 18 | Ht 59.0 in | Wt 190.9 lb

## 2020-05-12 DIAGNOSIS — Z8542 Personal history of malignant neoplasm of other parts of uterus: Secondary | ICD-10-CM | POA: Diagnosis not present

## 2020-05-12 DIAGNOSIS — Z794 Long term (current) use of insulin: Secondary | ICD-10-CM | POA: Diagnosis not present

## 2020-05-12 DIAGNOSIS — D509 Iron deficiency anemia, unspecified: Secondary | ICD-10-CM | POA: Diagnosis not present

## 2020-05-12 DIAGNOSIS — Z90710 Acquired absence of both cervix and uterus: Secondary | ICD-10-CM | POA: Diagnosis not present

## 2020-05-12 DIAGNOSIS — C9110 Chronic lymphocytic leukemia of B-cell type not having achieved remission: Secondary | ICD-10-CM

## 2020-05-12 DIAGNOSIS — E119 Type 2 diabetes mellitus without complications: Secondary | ICD-10-CM | POA: Insufficient documentation

## 2020-05-12 DIAGNOSIS — I10 Essential (primary) hypertension: Secondary | ICD-10-CM | POA: Diagnosis not present

## 2020-05-12 DIAGNOSIS — Z79899 Other long term (current) drug therapy: Secondary | ICD-10-CM | POA: Diagnosis not present

## 2020-05-12 LAB — CMP (CANCER CENTER ONLY)
ALT: 15 U/L (ref 0–44)
AST: 18 U/L (ref 15–41)
Albumin: 4.1 g/dL (ref 3.5–5.0)
Alkaline Phosphatase: 74 U/L (ref 38–126)
Anion gap: 10 (ref 5–15)
BUN: 36 mg/dL — ABNORMAL HIGH (ref 8–23)
CO2: 22 mmol/L (ref 22–32)
Calcium: 9 mg/dL (ref 8.9–10.3)
Chloride: 108 mmol/L (ref 98–111)
Creatinine: 1.67 mg/dL — ABNORMAL HIGH (ref 0.44–1.00)
GFR, Est AFR Am: 35 mL/min — ABNORMAL LOW (ref >60)
GFR, Estimated: 30 mL/min — ABNORMAL LOW (ref >60)
Glucose, Bld: 147 mg/dL — ABNORMAL HIGH (ref 70–99)
Potassium: 4.5 mmol/L (ref 3.5–5.1)
Sodium: 140 mmol/L (ref 135–145)
Total Bilirubin: 0.8 mg/dL (ref 0.3–1.2)
Total Protein: 6.3 g/dL — ABNORMAL LOW (ref 6.5–8.1)

## 2020-05-12 LAB — CBC WITH DIFFERENTIAL (CANCER CENTER ONLY)
Abs Immature Granulocytes: 0.04 10*3/uL (ref 0.00–0.07)
Basophils Absolute: 0.1 10*3/uL (ref 0.0–0.1)
Basophils Relative: 1 %
Eosinophils Absolute: 0.2 10*3/uL (ref 0.0–0.5)
Eosinophils Relative: 2 %
HCT: 37.1 % (ref 36.0–46.0)
Hemoglobin: 11.9 g/dL — ABNORMAL LOW (ref 12.0–15.0)
Immature Granulocytes: 1 %
Lymphocytes Relative: 52 %
Lymphs Abs: 4.6 10*3/uL — ABNORMAL HIGH (ref 0.7–4.0)
MCH: 28.4 pg (ref 26.0–34.0)
MCHC: 32.1 g/dL (ref 30.0–36.0)
MCV: 88.5 fL (ref 80.0–100.0)
Monocytes Absolute: 0.7 10*3/uL (ref 0.1–1.0)
Monocytes Relative: 8 %
Neutro Abs: 3.2 10*3/uL (ref 1.7–7.7)
Neutrophils Relative %: 36 %
Platelet Count: 132 10*3/uL — ABNORMAL LOW (ref 150–400)
RBC: 4.19 MIL/uL (ref 3.87–5.11)
RDW: 12.3 % (ref 11.5–15.5)
WBC Count: 8.9 10*3/uL (ref 4.0–10.5)
nRBC: 0 % (ref 0.0–0.2)

## 2020-05-12 LAB — LACTATE DEHYDROGENASE: LDH: 176 U/L (ref 98–192)

## 2020-05-12 NOTE — Patient Instructions (Signed)
Thank you for choosing [facility] to provide your oncology and hematology care.   Should you have questions after your visit to the [facility] ([facility]), please contact this office at [phone] between 8:30 AM and 4:30 PM.  Voice mails left after 4:00 PM may not be returned until the following business day.  Calls received after 4:30 PM will be answered by an off-site Nurse Triage Line.    Prescription Refills:  Please have your pharmacy contact us directly for most prescription requests.  Contact the office directly for refills of narcotics (pain medications). Allow 48-72 hours for refills.  Appointments: Please contact the [facility] scheduling department [phone] for questions regarding [facility] appointment scheduling.  Contact the schedulers with any scheduling changes so that your appointment can be rescheduled in a timely manner.   Central Scheduling for [place] [phone] - Call to schedule procedures such as PET scans, CT scans, MRI, Ultrasound, etc.  To afford each patient quality time with our providers, please arrive 30 minutes before your scheduled appointment time.  If you arrive late for your appointment, you may be asked to reschedule.  We strive to give you quality time with our providers, and arriving late affects you and other patients whose appointments are after yours. If you are a no show for multiple scheduled visits, you may be dismissed from the clinic at the providers discretion.     Resources: [facility] Social Workers [phone] for additional information on assistance programs or assistance connecting with community support programs   [place] DSS  336-641-3447: Information regarding food stamps, Medicaid, and utility assistance [place]AT 336-333-6589   [place] [employer]'s shared-ride transportation service for eligible riders who have a disability that prevents them from riding the fixed route bus.   Medicare Rights Center  800-333-4114 Helps people with Medicare understand their rights and benefits, navigate the Medicare system, and secure the quality healthcare they deserve [employer] 800-227-2345 Assists patients locate various types of support and financial assistance Cancer Care: 1-800-813-HOPE (4673) Provides financial assistance, online support groups, medication/co-pay assistance.   Transportation Assistance for appointments at [facility]: Transportation Coordinator [phone]  Again, thank you for choosing [facility] for your care.       

## 2020-05-12 NOTE — Telephone Encounter (Signed)
Scheduled per 7/12 los. Pt is aware of appt time and date. No avs or calendar needed to be printed.

## 2020-05-14 ENCOUNTER — Other Ambulatory Visit: Payer: Self-pay

## 2020-05-15 ENCOUNTER — Ambulatory Visit (INDEPENDENT_AMBULATORY_CARE_PROVIDER_SITE_OTHER): Payer: Medicare Other | Admitting: Family Medicine

## 2020-05-15 ENCOUNTER — Encounter: Payer: Self-pay | Admitting: Family Medicine

## 2020-05-15 VITALS — BP 100/62 | HR 72 | Temp 97.5°F | Ht 59.5 in | Wt 189.4 lb

## 2020-05-15 DIAGNOSIS — N183 Chronic kidney disease, stage 3 unspecified: Secondary | ICD-10-CM | POA: Diagnosis not present

## 2020-05-15 DIAGNOSIS — E782 Mixed hyperlipidemia: Secondary | ICD-10-CM

## 2020-05-15 DIAGNOSIS — E1165 Type 2 diabetes mellitus with hyperglycemia: Secondary | ICD-10-CM

## 2020-05-15 DIAGNOSIS — F3340 Major depressive disorder, recurrent, in remission, unspecified: Secondary | ICD-10-CM | POA: Diagnosis not present

## 2020-05-15 DIAGNOSIS — Z23 Encounter for immunization: Secondary | ICD-10-CM

## 2020-05-15 DIAGNOSIS — C9110 Chronic lymphocytic leukemia of B-cell type not having achieved remission: Secondary | ICD-10-CM | POA: Diagnosis not present

## 2020-05-15 DIAGNOSIS — I10 Essential (primary) hypertension: Secondary | ICD-10-CM | POA: Diagnosis not present

## 2020-05-15 DIAGNOSIS — H6122 Impacted cerumen, left ear: Secondary | ICD-10-CM | POA: Diagnosis not present

## 2020-05-15 DIAGNOSIS — F411 Generalized anxiety disorder: Secondary | ICD-10-CM | POA: Diagnosis not present

## 2020-05-15 DIAGNOSIS — Z13820 Encounter for screening for osteoporosis: Secondary | ICD-10-CM | POA: Diagnosis not present

## 2020-05-15 LAB — LIPID PANEL
Cholesterol: 116 mg/dL (ref 0–200)
HDL: 21.9 mg/dL — ABNORMAL LOW (ref >39.00)
NonHDL: 94.31
Total CHOL/HDL Ratio: 5
Triglycerides: 207 mg/dL — ABNORMAL HIGH (ref 0.0–149.0)
VLDL: 41.4 mg/dL — ABNORMAL HIGH (ref 0.0–40.0)

## 2020-05-15 LAB — HEMOGLOBIN A1C: Hgb A1c MFr Bld: 7.7 % — ABNORMAL HIGH (ref 4.6–6.5)

## 2020-05-15 LAB — LDL CHOLESTEROL, DIRECT: Direct LDL: 55 mg/dL

## 2020-05-15 MED ORDER — BENAZEPRIL-HYDROCHLOROTHIAZIDE 20-12.5 MG PO TABS: 1.0000 | ORAL_TABLET | Freq: Every day | ORAL | 3 refills | Status: DC

## 2020-05-15 NOTE — Patient Instructions (Addendum)
Health Maintenance Due  Topic Date Due  . COVID-19 Vaccine (1) Never done  . TETANUS/TDAP Pt will receive today if ok with provider. 11/13/2019  . FOOT EXAM  05/01/2020    Depression screen [facility] 2/9 05/02/2019 03/13/2018 12/19/2017  Decreased Interest 0 0 2  Down, Depressed, Hopeless 1 0 2  PHQ - 2 Score 1 0 4  Altered sleeping 1 - 3  Tired, decreased energy 1 - 1  Change in appetite 1 - 2  Feeling bad or failure about yourself  0 - 0  Trouble concentrating 0 - 0  Moving slowly or fidgety/restless 0 - 0  Suicidal thoughts 0 - 0  PHQ-9 Score 4 - 10  Difficult doing work/chores Not difficult at all - Extremely dIfficult  Some recent data might be hidden     [email or web address]

## 2020-05-15 NOTE — Progress Notes (Addendum)
Brenda [name] is a 69 y.o. female  Chief Complaint  Patient presents with  . Establish Care    Pt here for TOC/CPE.  Pt has had lab work on Monday.  Pt would like to inquire about a shingles injection.      HPI: Brenda [name] is a 69 y.o. female here as TOC, previous PCP Dr. Deborra Medina, for annual exam and f/u on chronic medical issues.  She follows with oncology Dr. Irene Limbo for CLL, endo Dr. Cruzita Lederer for DM, cardio Dr. Rockey Situ, ortho Dr. Amedeo Plenty and a few other ortho, nephro Dr. Hollie Salk. Pt states she has labs 3 days ago and on review of chart pt has CMP, CBC. Last A1C in 12/2019. Last lipid panel in 05/2019. Unsure of last Vit D.  She is due for Tdap.   Last mammo: 09/2019 Last Dexa: 2014  Last colonoscopy: 08/2018 - Dr. Tarri Glenn w/ [facility] - due in 2029  Med refills needed today? lotensin   Past Medical History:  Diagnosis Date  . Anxiety   . Arthritis   . CLL (chronic lymphocytic leukemia) (HCC)    Dx. 2019 Dr. Irene Limbo'  . Depression    Wellbutrin  . Diabetes mellitus    Type II  . Endometrial ca ([place])    1998  . 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 GERD (gastroesophageal reflux disease)   . Heart murmur    patient states "cardiologist has not heard heart murmur for past several years"  . Heel spur   . History of hiatal hernia    small  . 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 Hyperkalemia   . Hyperlipidemia   . Hypertension   . Left leg swelling    "swelling in left leg from knee down when sitting for prolonged periods or being on feet for a long period of time"  . PONV (postoperative nausea and vomiting)    after hysterectomy  . Renal insufficiency    Stage 3 b   Dr. Particia Nearing first visit 11-2019    Past Surgical History:  Procedure Laterality Date  . ABDOMINAL HYSTERECTOMY  1998  . COLONOSCOPY    . EYE SURGERY Bilateral    cataracts  .  EYE SURGERY Left    retina tear  . FEMUR IM NAIL  10/25/2012   Procedure: INTRAMEDULLARY (IM) NAIL FEMORAL;  Surgeon: Mauri Pole, MD;  Location: [facility];  Service: Orthopedics;  Laterality: Left;  . HIP SURGERY     Fracture L IM nail and 3 rods  . left knee arthroscopy  12/2010  . LYMPH NODE BIOPSY     axillary left 12-2018  . REFRACTIVE SURGERY    . skin cancer removed right and and lower forearm     squamoun in situ  . TOTAL KNEE ARTHROPLASTY  12/20/2011   Procedure: TOTAL KNEE ARTHROPLASTY;  Surgeon: Gearlean Alf, MD;  Location: [facility];  Service: Orthopedics;  Laterality: Left;  Failed attempt at spinal   . TOTAL SHOULDER ARTHROPLASTY Right 08/19/2016   Procedure: RIGHT TOTAL SHOULDER ARTHROPLASTY;  Surgeon: Justice Britain, MD;  Location: [place];  Service: Orthopedics;  Laterality: Right;  . TOTAL SHOULDER ARTHROPLASTY Left 01/10/2020   Procedure: TOTAL SHOULDER ARTHROPLASTY;  Surgeon: Justice Britain, MD;  Location: [facility];  Service: Orthopedics;  Laterality: Left;  155mn    Social History  Socioeconomic History  . Marital status: Single    Spouse name: Not on file  . Number of children: Not on file  . Years of education: Not on file  . Highest education level: Not on file  Occupational History  . 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 Smoking status: Never Smoker  . Smokeless tobacco: Never Used  Vaping Use  . Vaping Use: Never used  Substance and Sexual Activity  . Alcohol use: No  . Drug use: No  . Sexual activity: Not Currently  Other Topics Concern  . 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 Difficulty of Paying Living Expenses:   Food Insecurity:   . Worried About Charity fundraiser in the Last Year:   . Arboriculturist in the Last Year:   Transportation Needs:   . Film/video editor (Medical):   Marland Kitchen Lack of  Transportation (Non-Medical):   Physical Activity:   . Days of Exercise per Week:   . Minutes of Exercise per Session:   Stress:   . Feeling of Stress :   Social Connections:   . Frequency of Communication with Friends and Family:   . Frequency of Social Gatherings with Friends and Family:   . Attends Religious Services:   . Active Member of Clubs or Organizations:   . Attends Archivist Meetings:   Marland Kitchen Marital Status:   Intimate Partner Violence:   . Fear of Current or Ex-Partner:   . Emotionally Abused:   Marland Kitchen Physically Abused:   . Sexually Abused:     Family History  Problem Relation Age of Onset  . Cancer Mother        breast cancer  . Cancer Father        plasma sarcoma  . Breast cancer Neg Hx      Immunization History  Administered Date(s) Administered  . Fluad Quad(high Dose 65+) 06/27/2019  . Influenza,inj,quad, With Preservative 07/28/2017  . Influenza-Unspecified 08/01/2014, 07/31/2016  . PFIZER SARS-COV-2 Vaccination 12/31/2019, 01/21/2020  . Pneumococcal Conjugate-13 10/01/2014  . Pneumococcal Polysaccharide-23 11/03/2012, 12/19/2017  . Td 11/12/2009  . Zoster 12/14/2010    Outpatient Encounter Medications as of 05/15/2020  Medication Sig Note  . acetaminophen (TYLENOL) 500 MG tablet Take 500 mg by mouth every 6 (six) hours as needed. 05/12/2020: Takes 2 tablets every 6 hours as needed for back pain - typically takes 2 tabs in AM and 2 at HS  . ALPRAZolam (XANAX) 1 MG tablet Take 0.5-1 tablets (0.5-1 mg total) by mouth 2 (two) times daily as needed. for anxiety (Patient taking differently: Take 0.5-1 mg by mouth 2 (two) times daily as needed for anxiety. )   . aspirin 81 MG tablet Take 81 mg by mouth every other day.    . benazepril-hydrochlorthiazide (LOTENSIN HCT) 20-12.5 MG tablet TAKE 1 TABLET BY MOUTH DAILY   . Blood Glucose Monitoring Suppl (ONETOUCH VERIO) w/Device KIT Use to check blood sugar 3 times a day   . buPROPion (WELLBUTRIN XL) 300 MG 24  hr tablet Take 1 tablet (300 mg total) by mouth daily.   . Cholecalciferol (VITAMIN D3) 2000 units TABS Take 2,000 mcg by mouth daily.   . Cyanocobalamin (B-12) 1000 MCG SUBL Place 1,000 mcg  under the tongue every evening.    . cyclobenzaprine (FLEXERIL) 10 MG tablet Take 1 tablet (10 mg total) by mouth 3 (three) times daily as needed for muscle spasms. (Patient not taking: Reported on 05/12/2020)   . doxazosin (CARDURA) 2 MG tablet TAKE 1 TABLET(2 MG) BY MOUTH DAILY   . esomeprazole (NEXIUM) 20 MG capsule Take 20 mg by mouth daily at 12 noon. 05/12/2020: Takes PRN  . ezetimibe (ZETIA) 10 MG tablet Take 1 tablet (10 mg total) by mouth daily. (Patient taking differently: Take 100 mg by mouth every evening. )   . gabapentin (NEURONTIN) 300 MG capsule Take 1 capsule (300 mg total) by mouth at bedtime.   Marland Kitchen glipiZIDE (GLUCOTROL XL) 2.5 MG 24 hr tablet TAKE 1 TABLET BY MOUTH ONCE A DAY BEFORE BREAKFAST   . glipiZIDE (GLUCOTROL) 5 MG tablet TAKE 1 TABLET(5 MG) BY MOUTH DAILY BEFORE SUPPER   . glucose blood (ONETOUCH VERIO) test strip Use to check blood sugar 3 times a day.   . insulin glargine (LANTUS SOLOSTAR) 100 UNIT/ML Solostar Pen Inject 22-24 Units into the skin at bedtime.   . Insulin Pen Needle 32G X 4 MM MISC Use 1x a day   . iron polysaccharides (NIFEREX) 150 MG capsule Take 150 mg by mouth at bedtime.    Elmore Guise Devices (LANCING DEVICE) MISC Use as advised - Freestyle   . metoprolol tartrate (LOPRESSOR) 25 MG tablet Take 25 mg by mouth daily.   . NONFORMULARY OR COMPOUNDED ITEM Apply 1 application topically 2 (two) times daily as needed (Sun damage on face). FLUOROURACIL 5% + CALCIPOTRIENE 0.005%   . ondansetron (ZOFRAN) 4 MG tablet Take 1 tablet (4 mg total) by mouth every 8 (eight) hours as needed for nausea or vomiting.   Glory Rosebush Delica Lancets 34V MISC 1 each by Does not apply route 3 (three) times daily. To check blood sugar 3 times a day   . oxyCODONE-acetaminophen (PERCOCET) 5-325 MG  tablet Take 1 tablet by mouth every 4 (four) hours as needed (max 6 q). (Patient not taking: Reported on 05/12/2020)   . simvastatin (ZOCOR) 40 MG tablet Take 1 tablet (40 mg total) by mouth at bedtime.   . triamcinolone cream (KENALOG) 0.1 % Apply 1 application topically 2 (two) times daily as needed (skin rash.).     No facility-administered encounter medications on file as of 05/15/2020.     ROS: Gen: no fever, chills  Skin: no rash, itching ENT: no ear pain, ear drainage, nasal congestion, rhinorrhea, sinus pressure, sore throat Eyes: no blurry vision, double vision Resp: no cough, wheeze,SOB CV: no CP, palpitations, LE edema,  GI: no heartburn, n/v/d/c, abd pain GU: no dysuria, urgency, frequency, hematuria MSK: chronic Lt shoulder pain Neuro: no dizziness, headache, weakness, vertigo Psych: + anxiety, depression   Allergies  Allergen Reactions  . Amlodipine     Swelling    . Nsaids     Due to kidney function    BP 100/62 (BP Location: Left Arm, Patient Position: Sitting, Cuff Size: Normal)   Pulse 72   Temp (!) 97.5 F (36.4 C) (Temporal)   Ht 4' 11.5" (1.511 m)   Wt 189 lb 6.4 oz (85.9 kg)   SpO2 96%   BMI 37.61 kg/m    BP Readings from Last 3 Encounters:  05/15/20 100/62  05/12/20 109/61  03/05/20 110/80   Pulse Readings from Last 3 Encounters:  05/15/20 72  05/12/20 73  03/05/20 78   Wt  Readings from Last 3 Encounters:  05/15/20 189 lb 6.4 oz (85.9 kg)  05/12/20 190 lb 14.4 oz (86.6 kg)  03/05/20 186 lb (84.4 kg)     Physical Exam Constitutional:      General: She is not in acute distress.    Appearance: She is well-developed.  HENT:     Head: Normocephalic and atraumatic.     Right Ear: Tympanic membrane, ear canal and external ear normal.     Left Ear: External ear normal. There is impacted cerumen.     Nose: Nose normal.  Eyes:     Conjunctiva/sclera: Conjunctivae normal.     Pupils: Pupils are equal, round, and reactive to light.  Neck:      Thyroid: No thyromegaly.  Cardiovascular:     Rate and Rhythm: Normal rate and regular rhythm.     Heart sounds: Normal heart sounds. No murmur heard.   Pulmonary:     Effort: Pulmonary effort is normal. No respiratory distress.     Breath sounds: Normal breath sounds. No wheezing or rhonchi.  Abdominal:     General: Bowel sounds are normal. There is no distension.     Palpations: Abdomen is soft. There is no mass.     Tenderness: There is no abdominal tenderness.  Musculoskeletal:     Cervical back: Neck supple.  Lymphadenopathy:     Cervical: No cervical adenopathy.  Skin:    General: Skin is warm and dry.  Neurological:     Mental Status: She is alert and oriented to person, place, and time.     Motor: No abnormal muscle tone.     Coordination: Coordination normal.  Psychiatric:        Behavior: Behavior normal.      A/P:  1. Essential hypertension - controlled, at goal Refill: - benazepril-hydrochlorthiazide (LOTENSIN HCT) 20-12.5 MG tablet; Take 1 tablet by mouth daily.  Dispense: 90 tablet; Refill: 3  2. Type 2 diabetes mellitus with hyperglycemia, without long-term current use of insulin (HCC) - cont regular f/u with endo Dr. Cruzita Lederer - cont current meds - Hemoglobin A1c  3. Stage 3 chronic kidney disease, unspecified whether stage 3a or 3b CKD - CMP done - follows with nephro, due in 07/2020  4. Mixed hyperlipidemia - on zocor - Lipid panel  5. CLL (chronic lymphocytic leukemia) (HCC) - stable, follows with heme/onc  6. Generalized anxiety disorder 7. Recurrent major depressive disorder, in remission ([place]) - stable on current meds - PHQ-9 = 10, GAD-7 = 11  8. Screening for osteoporosis - DG Bone Density; Future  9. Need for shingles vaccine - Varicella-zoster vaccine IM (Shingrix) - need for #2 in 2-2mo 10. Need for Tdap vaccination - Tdap vaccine greater than or equal to 7yo IM  11. Left ear impacted cerumen - Ear Lavage - informed  consent obtained prior to procedure. pt tolerated without issue and cerumen removed.  This visit occurred during the SARS-CoV-2 public health emergency.  Safety protocols were in place, including screening questions prior to the visit, additional usage of staff PPE, and extensive cleaning of exam room while observing appropriate contact time as indicated for disinfecting solutions.

## 2020-05-16 ENCOUNTER — Other Ambulatory Visit: Payer: Self-pay | Admitting: Internal Medicine

## 2020-05-16 ENCOUNTER — Encounter: Payer: Self-pay | Admitting: Internal Medicine

## 2020-05-16 ENCOUNTER — Encounter: Payer: Self-pay | Admitting: Family Medicine

## 2020-05-16 MED ORDER — GLIPIZIDE ER 2.5 MG PO TB24: ORAL_TABLET | ORAL | 3 refills | Status: DC

## 2020-05-19 ENCOUNTER — Other Ambulatory Visit: Payer: Self-pay | Admitting: Hematology & Oncology

## 2020-05-19 ENCOUNTER — Other Ambulatory Visit: Payer: Self-pay | Admitting: Family Medicine

## 2020-05-19 DIAGNOSIS — Z1231 Encounter for screening mammogram for malignant neoplasm of breast: Secondary | ICD-10-CM

## 2020-05-21 ENCOUNTER — Ambulatory Visit
Admission: RE | Admit: 2020-05-21 | Discharge: 2020-05-21 | Disposition: A | Discharge status: Home / Self Care | Payer: Medicare Other | Source: Ambulatory Visit | Attending: Family Medicine | Admitting: Family Medicine

## 2020-05-21 ENCOUNTER — Other Ambulatory Visit: Payer: Self-pay

## 2020-05-21 DIAGNOSIS — M8589 Other specified disorders of bone density and structure, multiple sites: Secondary | ICD-10-CM | POA: Diagnosis not present

## 2020-05-21 DIAGNOSIS — Z13820 Encounter for screening for osteoporosis: Secondary | ICD-10-CM

## 2020-05-21 DIAGNOSIS — Z780 Asymptomatic menopausal state: Secondary | ICD-10-CM | POA: Diagnosis not present

## 2020-05-22 ENCOUNTER — Encounter: Payer: Self-pay | Admitting: Family Medicine

## 2020-05-22 DIAGNOSIS — M8580 Other specified disorders of bone density and structure, unspecified site: Secondary | ICD-10-CM | POA: Insufficient documentation

## 2020-05-24 ENCOUNTER — Other Ambulatory Visit: Payer: Self-pay | Admitting: Cardiovascular Disease

## 2020-06-06 ENCOUNTER — Encounter: Payer: Self-pay | Admitting: Family Medicine

## 2020-06-09 DIAGNOSIS — N1832 Chronic kidney disease, stage 3b: Secondary | ICD-10-CM | POA: Diagnosis not present

## 2020-06-09 DIAGNOSIS — E875 Hyperkalemia: Secondary | ICD-10-CM | POA: Diagnosis not present

## 2020-06-09 DIAGNOSIS — E785 Hyperlipidemia, unspecified: Secondary | ICD-10-CM | POA: Diagnosis not present

## 2020-06-09 DIAGNOSIS — I129 Hypertensive chronic kidney disease with stage 1 through stage 4 chronic kidney disease, or unspecified chronic kidney disease: Secondary | ICD-10-CM | POA: Diagnosis not present

## 2020-06-09 DIAGNOSIS — K219 Gastro-esophageal reflux disease without esophagitis: Secondary | ICD-10-CM | POA: Diagnosis not present

## 2020-06-09 DIAGNOSIS — E1122 Type 2 diabetes mellitus with diabetic chronic kidney disease: Secondary | ICD-10-CM | POA: Diagnosis not present

## 2020-06-11 MED ORDER — ALPRAZOLAM 1 MG PO TABS: 0.5000 mg | ORAL_TABLET | Freq: Two times a day (BID) | ORAL | 2 refills | Status: DC | PRN

## 2020-06-22 ENCOUNTER — Emergency Department: Payer: Medicare Other

## 2020-06-22 ENCOUNTER — Inpatient Hospital Stay
Admission: EM | Admit: 2020-06-22 | Discharge: 2020-06-26 | DRG: 682 | Disposition: A | Discharge status: Home Health | Payer: Medicare Other | Attending: Family Medicine | Admitting: Family Medicine

## 2020-06-22 ENCOUNTER — Other Ambulatory Visit: Payer: Self-pay

## 2020-06-22 DIAGNOSIS — E869 Volume depletion, unspecified: Secondary | ICD-10-CM | POA: Diagnosis not present

## 2020-06-22 DIAGNOSIS — E1169 Type 2 diabetes mellitus with other specified complication: Secondary | ICD-10-CM

## 2020-06-22 DIAGNOSIS — I82812 Embolism and thrombosis of superficial veins of left lower extremities: Secondary | ICD-10-CM | POA: Diagnosis not present

## 2020-06-22 DIAGNOSIS — Z96611 Presence of right artificial shoulder joint: Secondary | ICD-10-CM | POA: Diagnosis present

## 2020-06-22 DIAGNOSIS — E785 Hyperlipidemia, unspecified: Secondary | ICD-10-CM | POA: Diagnosis present

## 2020-06-22 DIAGNOSIS — S00511A Abrasion of lip, initial encounter: Secondary | ICD-10-CM | POA: Diagnosis present

## 2020-06-22 DIAGNOSIS — R52 Pain, unspecified: Secondary | ICD-10-CM | POA: Diagnosis not present

## 2020-06-22 DIAGNOSIS — Z66 Do not resuscitate: Secondary | ICD-10-CM | POA: Diagnosis not present

## 2020-06-22 DIAGNOSIS — N1832 Chronic kidney disease, stage 3b: Secondary | ICD-10-CM | POA: Diagnosis present

## 2020-06-22 DIAGNOSIS — R509 Fever, unspecified: Secondary | ICD-10-CM | POA: Diagnosis not present

## 2020-06-22 DIAGNOSIS — R609 Edema, unspecified: Secondary | ICD-10-CM

## 2020-06-22 DIAGNOSIS — N189 Chronic kidney disease, unspecified: Secondary | ICD-10-CM

## 2020-06-22 DIAGNOSIS — F418 Other specified anxiety disorders: Secondary | ICD-10-CM

## 2020-06-22 DIAGNOSIS — C8590 Non-Hodgkin lymphoma, unspecified, unspecified site: Secondary | ICD-10-CM | POA: Diagnosis present

## 2020-06-22 DIAGNOSIS — D631 Anemia in chronic kidney disease: Secondary | ICD-10-CM | POA: Diagnosis present

## 2020-06-22 DIAGNOSIS — K8020 Calculus of gallbladder without cholecystitis without obstruction: Secondary | ICD-10-CM | POA: Diagnosis not present

## 2020-06-22 DIAGNOSIS — R590 Localized enlarged lymph nodes: Secondary | ICD-10-CM | POA: Diagnosis not present

## 2020-06-22 DIAGNOSIS — E11649 Type 2 diabetes mellitus with hypoglycemia without coma: Secondary | ICD-10-CM | POA: Diagnosis not present

## 2020-06-22 DIAGNOSIS — S2242XA Multiple fractures of ribs, left side, initial encounter for closed fracture: Secondary | ICD-10-CM | POA: Diagnosis not present

## 2020-06-22 DIAGNOSIS — Z20822 Contact with and (suspected) exposure to covid-19: Secondary | ICD-10-CM | POA: Diagnosis present

## 2020-06-22 DIAGNOSIS — R0902 Hypoxemia: Secondary | ICD-10-CM | POA: Diagnosis not present

## 2020-06-22 DIAGNOSIS — J9601 Acute respiratory failure with hypoxia: Secondary | ICD-10-CM | POA: Diagnosis not present

## 2020-06-22 DIAGNOSIS — Z79899 Other long term (current) drug therapy: Secondary | ICD-10-CM | POA: Diagnosis not present

## 2020-06-22 DIAGNOSIS — I129 Hypertensive chronic kidney disease with stage 1 through stage 4 chronic kidney disease, or unspecified chronic kidney disease: Secondary | ICD-10-CM | POA: Diagnosis present

## 2020-06-22 DIAGNOSIS — Z90710 Acquired absence of both cervix and uterus: Secondary | ICD-10-CM

## 2020-06-22 DIAGNOSIS — W010XXA Fall on same level from slipping, tripping and stumbling without subsequent striking against object, initial encounter: Secondary | ICD-10-CM | POA: Diagnosis present

## 2020-06-22 DIAGNOSIS — R42 Dizziness and giddiness: Secondary | ICD-10-CM | POA: Diagnosis not present

## 2020-06-22 DIAGNOSIS — S2242XD Multiple fractures of ribs, left side, subsequent encounter for fracture with routine healing: Secondary | ICD-10-CM | POA: Diagnosis not present

## 2020-06-22 DIAGNOSIS — I959 Hypotension, unspecified: Secondary | ICD-10-CM

## 2020-06-22 DIAGNOSIS — W19XXXA Unspecified fall, initial encounter: Secondary | ICD-10-CM

## 2020-06-22 DIAGNOSIS — E1122 Type 2 diabetes mellitus with diabetic chronic kidney disease: Secondary | ICD-10-CM | POA: Diagnosis present

## 2020-06-22 DIAGNOSIS — C9110 Chronic lymphocytic leukemia of B-cell type not having achieved remission: Secondary | ICD-10-CM | POA: Diagnosis not present

## 2020-06-22 DIAGNOSIS — Z85828 Personal history of other malignant neoplasm of skin: Secondary | ICD-10-CM

## 2020-06-22 DIAGNOSIS — Z7982 Long term (current) use of aspirin: Secondary | ICD-10-CM

## 2020-06-22 DIAGNOSIS — K37 Unspecified appendicitis: Secondary | ICD-10-CM

## 2020-06-22 DIAGNOSIS — F411 Generalized anxiety disorder: Secondary | ICD-10-CM | POA: Diagnosis present

## 2020-06-22 DIAGNOSIS — E872 Acidosis: Secondary | ICD-10-CM | POA: Diagnosis present

## 2020-06-22 DIAGNOSIS — Y92009 Unspecified place in unspecified non-institutional (private) residence as the place of occurrence of the external cause: Secondary | ICD-10-CM | POA: Diagnosis not present

## 2020-06-22 DIAGNOSIS — S2249XA Multiple fractures of ribs, unspecified side, initial encounter for closed fracture: Secondary | ICD-10-CM

## 2020-06-22 DIAGNOSIS — N179 Acute kidney failure, unspecified: Principal | ICD-10-CM | POA: Diagnosis present

## 2020-06-22 DIAGNOSIS — S2242XS Multiple fractures of ribs, left side, sequela: Secondary | ICD-10-CM | POA: Diagnosis not present

## 2020-06-22 DIAGNOSIS — R001 Bradycardia, unspecified: Secondary | ICD-10-CM | POA: Diagnosis not present

## 2020-06-22 DIAGNOSIS — Z794 Long term (current) use of insulin: Secondary | ICD-10-CM

## 2020-06-22 DIAGNOSIS — R531 Weakness: Secondary | ICD-10-CM | POA: Diagnosis not present

## 2020-06-22 DIAGNOSIS — Z96612 Presence of left artificial shoulder joint: Secondary | ICD-10-CM | POA: Diagnosis present

## 2020-06-22 DIAGNOSIS — N183 Chronic kidney disease, stage 3 unspecified: Secondary | ICD-10-CM | POA: Diagnosis not present

## 2020-06-22 DIAGNOSIS — S301XXA Contusion of abdominal wall, initial encounter: Secondary | ICD-10-CM | POA: Diagnosis not present

## 2020-06-22 DIAGNOSIS — I10 Essential (primary) hypertension: Secondary | ICD-10-CM | POA: Diagnosis present

## 2020-06-22 DIAGNOSIS — Z8542 Personal history of malignant neoplasm of other parts of uterus: Secondary | ICD-10-CM

## 2020-06-22 DIAGNOSIS — Z96652 Presence of left artificial knee joint: Secondary | ICD-10-CM | POA: Diagnosis present

## 2020-06-22 DIAGNOSIS — K219 Gastro-esophageal reflux disease without esophagitis: Secondary | ICD-10-CM | POA: Diagnosis present

## 2020-06-22 DIAGNOSIS — J9811 Atelectasis: Secondary | ICD-10-CM | POA: Diagnosis not present

## 2020-06-22 DIAGNOSIS — I82612 Acute embolism and thrombosis of superficial veins of left upper extremity: Secondary | ICD-10-CM | POA: Diagnosis not present

## 2020-06-22 DIAGNOSIS — I2510 Atherosclerotic heart disease of native coronary artery without angina pectoris: Secondary | ICD-10-CM | POA: Diagnosis not present

## 2020-06-22 DIAGNOSIS — D649 Anemia, unspecified: Secondary | ICD-10-CM | POA: Diagnosis not present

## 2020-06-22 DIAGNOSIS — I700 Atherosclerosis of aorta: Secondary | ICD-10-CM | POA: Diagnosis not present

## 2020-06-22 LAB — GLUCOSE, CAPILLARY
Glucose-Capillary: 42 mg/dL — CL (ref 70–99)
Glucose-Capillary: 132 mg/dL — ABNORMAL HIGH (ref 70–99)
Glucose-Capillary: 155 mg/dL — ABNORMAL HIGH (ref 70–99)
Glucose-Capillary: 152 mg/dL — ABNORMAL HIGH (ref 70–99)
Glucose-Capillary: 191 mg/dL — ABNORMAL HIGH (ref 70–99)

## 2020-06-22 LAB — CBC
HCT: 28.8 % — ABNORMAL LOW (ref 36.0–46.0)
HCT: 33.8 % — ABNORMAL LOW (ref 36.0–46.0)
Hemoglobin: 10.9 g/dL — ABNORMAL LOW (ref 12.0–15.0)
Hemoglobin: 9.7 g/dL — ABNORMAL LOW (ref 12.0–15.0)
MCH: 28.7 pg (ref 26.0–34.0)
MCH: 29.3 pg (ref 26.0–34.0)
MCHC: 32.2 g/dL (ref 30.0–36.0)
MCHC: 33.7 g/dL (ref 30.0–36.0)
MCV: 87 fL (ref 80.0–100.0)
MCV: 88.9 fL (ref 80.0–100.0)
Platelets: 160 10*3/uL (ref 150–400)
Platelets: 160 10*3/uL (ref 150–400)
RBC: 3.31 MIL/uL — ABNORMAL LOW (ref 3.87–5.11)
RBC: 3.8 MIL/uL — ABNORMAL LOW (ref 3.87–5.11)
RDW: 13.2 % (ref 11.5–15.5)
RDW: 13.2 % (ref 11.5–15.5)
WBC: 13.7 10*3/uL — ABNORMAL HIGH (ref 4.0–10.5)
WBC: 15.4 10*3/uL — ABNORMAL HIGH (ref 4.0–10.5)
nRBC: 0 % (ref 0.0–0.2)
nRBC: 0 % (ref 0.0–0.2)

## 2020-06-22 LAB — BASIC METABOLIC PANEL
Anion gap: 11 (ref 5–15)
BUN: 62 mg/dL — ABNORMAL HIGH (ref 8–23)
CO2: 17 mmol/L — ABNORMAL LOW (ref 22–32)
Calcium: 6.2 mg/dL — CL (ref 8.9–10.3)
Chloride: 107 mmol/L (ref 98–111)
Creatinine, Ser: 3.05 mg/dL — ABNORMAL HIGH (ref 0.44–1.00)
GFR calc Af Amer: 17 mL/min — ABNORMAL LOW (ref >60)
GFR calc non Af Amer: 15 mL/min — ABNORMAL LOW (ref >60)
Glucose, Bld: 41 mg/dL — CL (ref 70–99)
Potassium: 3.1 mmol/L — ABNORMAL LOW (ref 3.5–5.1)
Sodium: 135 mmol/L (ref 135–145)

## 2020-06-22 LAB — COMPREHENSIVE METABOLIC PANEL WITH GFR
ALT: 12 U/L (ref 0–44)
AST: 15 U/L (ref 15–41)
Albumin: 3.4 g/dL — ABNORMAL LOW (ref 3.5–5.0)
Alkaline Phosphatase: 71 U/L (ref 38–126)
Anion gap: 15 (ref 5–15)
BUN: 77 mg/dL — ABNORMAL HIGH (ref 8–23)
CO2: 19 mmol/L — ABNORMAL LOW (ref 22–32)
Calcium: 7.8 mg/dL — ABNORMAL LOW (ref 8.9–10.3)
Chloride: 98 mmol/L (ref 98–111)
Creatinine, Ser: 4.63 mg/dL — ABNORMAL HIGH (ref 0.44–1.00)
GFR calc Af Amer: 10 mL/min — ABNORMAL LOW
GFR calc non Af Amer: 9 mL/min — ABNORMAL LOW
Glucose, Bld: 90 mg/dL (ref 70–99)
Potassium: 3.8 mmol/L (ref 3.5–5.1)
Sodium: 132 mmol/L — ABNORMAL LOW (ref 135–145)
Total Bilirubin: 0.9 mg/dL (ref 0.3–1.2)
Total Protein: 5.8 g/dL — ABNORMAL LOW (ref 6.5–8.1)

## 2020-06-22 LAB — LACTIC ACID, PLASMA: Lactic Acid, Venous: 0.9 mmol/L (ref 0.5–1.9)

## 2020-06-22 LAB — SARS CORONAVIRUS 2 BY RT PCR (HOSPITAL ORDER, PERFORMED IN ~~LOC~~ HOSPITAL LAB): SARS Coronavirus 2: NEGATIVE

## 2020-06-22 LAB — TROPONIN I (HIGH SENSITIVITY): Troponin I (High Sensitivity): 15 ng/L

## 2020-06-22 LAB — CK: Total CK: 209 U/L (ref 38–234)

## 2020-06-22 MED ORDER — SODIUM CHLORIDE 0.9 % IV SOLN: INTRAVENOUS | Status: DC

## 2020-06-22 MED ORDER — ONDANSETRON HCL 4 MG PO TABS
4.0000 mg | ORAL_TABLET | Freq: Four times a day (QID) | ORAL | Status: DC | PRN
  Filled 2020-06-22: qty 1

## 2020-06-22 MED ORDER — TRIAMCINOLONE ACETONIDE 0.1 % EX CREA
1.0000 "application " | TOPICAL_CREAM | Freq: Two times a day (BID) | CUTANEOUS | Status: DC | PRN
  Filled 2020-06-22: qty 15

## 2020-06-22 MED ORDER — HEPARIN SODIUM (PORCINE) 5000 UNIT/ML IJ SOLN
5000.0000 [IU] | Freq: Three times a day (TID) | INTRAMUSCULAR | Status: DC
  Administered 2020-06-22 – 2020-06-24 (×6): 5000 [IU] via SUBCUTANEOUS
  Filled 2020-06-22 (×6): qty 1

## 2020-06-22 MED ORDER — ACETAMINOPHEN 500 MG PO TABS
500.0000 mg | ORAL_TABLET | Freq: Four times a day (QID) | ORAL | Status: DC | PRN
  Administered 2020-06-23 – 2020-06-26 (×9): 1000 mg via ORAL
  Filled 2020-06-22 (×9): qty 2

## 2020-06-22 MED ORDER — EZETIMIBE 10 MG PO TABS: 100.0000 mg | ORAL_TABLET | Freq: Every evening | ORAL | Status: DC

## 2020-06-22 MED ORDER — VITAMIN B-12 1000 MCG PO TABS
1000.0000 ug | ORAL_TABLET | Freq: Every evening | ORAL | Status: DC
  Administered 2020-06-22 – 2020-06-25 (×4): 1000 ug via ORAL
  Filled 2020-06-22 (×4): qty 1

## 2020-06-22 MED ORDER — SODIUM CHLORIDE 0.9 % IV SOLN
1000.0000 mL | Freq: Once | INTRAVENOUS | Status: AC
  Administered 2020-06-22: 1000 mL via INTRAVENOUS

## 2020-06-22 MED ORDER — LIDOCAINE 5 % EX PTCH
1.0000 | MEDICATED_PATCH | CUTANEOUS | Status: DC
  Administered 2020-06-22 – 2020-06-25 (×4): 1 via TRANSDERMAL
  Filled 2020-06-22 (×5): qty 1

## 2020-06-22 MED ORDER — SODIUM CHLORIDE 0.9 % IV SOLN
2.0000 g | INTRAVENOUS | Status: DC
  Administered 2020-06-22 – 2020-06-25 (×4): 2 g via INTRAVENOUS
  Filled 2020-06-22: qty 2
  Filled 2020-06-22 (×3): qty 20
  Filled 2020-06-22: qty 2

## 2020-06-22 MED ORDER — MORPHINE SULFATE (PF) 4 MG/ML IV SOLN
4.0000 mg | Freq: Once | INTRAVENOUS | Status: AC
  Administered 2020-06-22: 4 mg via INTRAVENOUS
  Filled 2020-06-22: qty 1

## 2020-06-22 MED ORDER — ASPIRIN 81 MG PO CHEW
81.0000 mg | CHEWABLE_TABLET | ORAL | Status: DC
  Administered 2020-06-22 – 2020-06-26 (×3): 81 mg via ORAL
  Filled 2020-06-22 (×6): qty 1

## 2020-06-22 MED ORDER — INSULIN ASPART 100 UNIT/ML ~~LOC~~ SOLN: 0.0000 [IU] | Freq: Three times a day (TID) | SUBCUTANEOUS | Status: DC

## 2020-06-22 MED ORDER — POLYSACCHARIDE IRON COMPLEX 150 MG PO CAPS
150.0000 mg | ORAL_CAPSULE | Freq: Every day | ORAL | Status: DC
  Administered 2020-06-22 – 2020-06-25 (×4): 150 mg via ORAL
  Filled 2020-06-22 (×5): qty 1

## 2020-06-22 MED ORDER — BUPROPION HCL ER (XL) 150 MG PO TB24
300.0000 mg | ORAL_TABLET | Freq: Every day | ORAL | Status: DC
  Administered 2020-06-22 – 2020-06-26 (×5): 300 mg via ORAL
  Filled 2020-06-22 (×5): qty 2

## 2020-06-22 MED ORDER — SODIUM CHLORIDE 0.9 % IV BOLUS
1000.0000 mL | Freq: Once | INTRAVENOUS | Status: AC
  Administered 2020-06-22: 1000 mL via INTRAVENOUS

## 2020-06-22 MED ORDER — DEXTROSE-NACL 5-0.9 % IV SOLN: INTRAVENOUS | Status: DC

## 2020-06-22 MED ORDER — B-12 1000 MCG SL SUBL: 1000.0000 ug | SUBLINGUAL_TABLET | Freq: Every evening | SUBLINGUAL | Status: DC

## 2020-06-22 MED ORDER — DEXTROSE 50 % IV SOLN
INTRAVENOUS | Status: AC
  Administered 2020-06-22: 50 mL
  Filled 2020-06-22: qty 50

## 2020-06-22 MED ORDER — INSULIN ASPART 100 UNIT/ML ~~LOC~~ SOLN
0.0000 [IU] | SUBCUTANEOUS | Status: DC
  Administered 2020-06-23 – 2020-06-26 (×4): 2 [IU] via SUBCUTANEOUS
  Filled 2020-06-22 (×4): qty 1

## 2020-06-22 MED ORDER — ALPRAZOLAM 0.5 MG PO TABS
0.5000 mg | ORAL_TABLET | Freq: Two times a day (BID) | ORAL | Status: DC | PRN
  Administered 2020-06-22 – 2020-06-24 (×2): 0.5 mg via ORAL
  Filled 2020-06-22 (×3): qty 1

## 2020-06-22 MED ORDER — METRONIDAZOLE IN NACL 5-0.79 MG/ML-% IV SOLN
500.0000 mg | Freq: Three times a day (TID) | INTRAVENOUS | Status: DC
  Administered 2020-06-22 – 2020-06-26 (×12): 500 mg via INTRAVENOUS
  Filled 2020-06-22 (×14): qty 100

## 2020-06-22 MED ORDER — ONDANSETRON HCL 4 MG/2ML IJ SOLN
4.0000 mg | Freq: Once | INTRAMUSCULAR | Status: AC
  Administered 2020-06-22: 4 mg via INTRAVENOUS
  Filled 2020-06-22: qty 2

## 2020-06-22 MED ORDER — ONDANSETRON HCL 4 MG/2ML IJ SOLN
4.0000 mg | Freq: Four times a day (QID) | INTRAMUSCULAR | Status: DC | PRN
  Administered 2020-06-25 – 2020-06-26 (×3): 4 mg via INTRAVENOUS
  Filled 2020-06-22 (×3): qty 2

## 2020-06-22 MED ORDER — CALCIUM GLUCONATE-NACL 1-0.675 GM/50ML-% IV SOLN
1.0000 g | Freq: Once | INTRAVENOUS | Status: AC
  Administered 2020-06-22: 1000 mg via INTRAVENOUS
  Filled 2020-06-22: qty 50

## 2020-06-22 MED ORDER — TRAMADOL HCL 50 MG PO TABS: 50.0000 mg | ORAL_TABLET | Freq: Three times a day (TID) | ORAL | Status: DC | PRN

## 2020-06-22 MED ORDER — DEXTROSE 250 MG/ML IV SOLN: 25.0000 g | Freq: Once | INTRAVENOUS | Status: DC

## 2020-06-22 MED ORDER — SIMVASTATIN 20 MG PO TABS
40.0000 mg | ORAL_TABLET | Freq: Every day | ORAL | Status: DC
  Administered 2020-06-22 – 2020-06-25 (×4): 40 mg via ORAL
  Filled 2020-06-22: qty 2
  Filled 2020-06-22 (×2): qty 4
  Filled 2020-06-22: qty 2

## 2020-06-22 NOTE — ED Triage Notes (Signed)
Pt reports that for several weeks she has been dizzy when bending over to pick up things

## 2020-06-22 NOTE — ED Notes (Signed)
Pt given lunchbox.

## 2020-06-22 NOTE — ED Notes (Signed)
Agbata MD notified of calcium of 6.2 at this time.

## 2020-06-22 NOTE — Consult Note (Signed)
Patient ID: Brenda [name], female   DOB: 1951/08/22, 69 y.o.   MRN: [id number]  HPI Brenda [name] is a 69 y.o. female seen in consultation at the request of Dr. Corky Downs.  She does have significant history of chronic lymphocytic leukemia and she came into the emergency room after she fell due to dizziness.  He does also have a significant history of renal insufficiency diabetes.  She fell early this morning.  She now reports left-sided chest wall pain.  No fevers no chills.  Pain is intermittent worsening with this inspiration and severe in nature.  She had a complete work-up including a CT scan of the abdomen and pelvis as well as the chest that I have personally reviewed.  She does have 4 rib fractures on the left side.  There is no evidence of pneumothorax is no evidence of hemothorax and abdominal CT is concerning for an enlarged appendix.  She also has significant lymphadenopathy that has grown as compared to the CT scan from 2 years ago. She is very clear that she does not have any abdominal pain and no vomiting.  She does report some decreased appetite.  She said that the exact same thing happened a few years ago and it was due to dehydration.  I do think that this is the same case and she has had failed to thrive due to the advancement of her lymphoma. There is a question whether or not the lymphoma now is involving the appendix.  CBC shows a white count of 15,000 and hemoglobin of 10.9 platelets 160, CMP shows acidosis and a creatinine of 4.6 with a BUN of 77.  Rest of the labs are normal.  HPI  Past Medical History:  Diagnosis Date  . Anxiety   . Arthritis   . CLL (chronic lymphocytic leukemia) (HCC)    Dx. 2019 Dr. Irene Limbo'  . Depression    Wellbutrin  . Diabetes mellitus    Type II  . Endometrial ca ([place])    1998  . 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 GERD (gastroesophageal reflux disease)   . Heart murmur    patient states "cardiologist  has not heard heart murmur for past several years"  . Heel spur   . History of hiatal hernia    small  . 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 Hyperkalemia   . Hyperlipidemia   . Hypertension   . Left leg swelling    "swelling in left leg from knee down when sitting for prolonged periods or being on feet for a long period of time"  . PONV (postoperative nausea and vomiting)    after hysterectomy  . Renal insufficiency    Stage 3 b   Dr. Particia Nearing first visit 11-2019    Past Surgical History:  Procedure Laterality Date  . ABDOMINAL HYSTERECTOMY  1998  . COLONOSCOPY    . EYE SURGERY Bilateral    cataracts  . EYE SURGERY Left    retina tear  . FEMUR IM NAIL  10/25/2012   Procedure: INTRAMEDULLARY (IM) NAIL FEMORAL;  Surgeon: Mauri Pole, MD;  [facility];  Service: Orthopedics;  Laterality: Left;  . HIP SURGERY     Fracture L IM nail and 3 rods  . left knee arthroscopy  12/2010  . LYMPH NODE BIOPSY     axillary  left 12-2018  . REFRACTIVE SURGERY    . skin cancer removed right and and lower forearm     squamoun in situ  . TOTAL KNEE ARTHROPLASTY  12/20/2011   Procedure: TOTAL KNEE ARTHROPLASTY;  Surgeon: Gearlean Alf, MD;  [facility];  Service: Orthopedics;  Laterality: Left;  Failed attempt at spinal   . TOTAL SHOULDER ARTHROPLASTY Right 08/19/2016   Procedure: RIGHT TOTAL SHOULDER ARTHROPLASTY;  Surgeon: Justice Britain, MD;  Location: [facility];  Service: Orthopedics;  Laterality: Right;  . TOTAL SHOULDER ARTHROPLASTY Left 01/10/2020   Procedure: TOTAL SHOULDER ARTHROPLASTY;  Surgeon: Justice Britain, MD;  [facility];  Service: Orthopedics;  Laterality: Left;  151mn    Family History  Problem Relation Age of Onset  . Cancer Mother        breast cancer  . Cancer Father        plasma sarcoma  . Breast cancer Neg Hx     Social History Social History   Tobacco Use  .  Smoking status: Never Smoker  . Smokeless tobacco: Never Used  Vaping Use  . Vaping Use: Never used  Substance Use Topics  . Alcohol use: No  . Drug use: No    Allergies  Allergen Reactions  . Amlodipine     Swelling    . Nsaids     Due to kidney function    Current Facility-Administered Medications  Medication Dose Route Frequency Provider Last Rate Last Admin  . 0.9 %  sodium chloride infusion   Intravenous Continuous Singh, Harmeet, MD      . 0.9 %  sodium chloride infusion  1,000 mL Intravenous Once Kenae Lindquist F, MD      . acetaminophen (TYLENOL) tablet 500-1,000 mg  500-1,000 mg Oral Q6H PRN Agbata, Tochukwu, MD      . ALPRAZolam (Duanne Moron tablet 0.5 mg  0.5 mg Oral BID PRN Agbata, Tochukwu, MD      . aspirin tablet 81 mg  81 mg Oral QODAY Agbata, Tochukwu, MD      . B-12 SUBL 1,000 mcg  1,000 mcg Sublingual QPM Agbata, Tochukwu, MD      . buPROPion (WELLBUTRIN XL) 24 hr tablet 300 mg  300 mg Oral Daily Agbata, Tochukwu, MD      . heparin injection 5,000 Units  5,000 Units Subcutaneous Q8H Agbata, Tochukwu, MD      . insulin aspart (novoLOG) injection 0-15 Units  0-15 Units Subcutaneous TID WC Agbata, Tochukwu, MD      . iron polysaccharides (NIFEREX) capsule 150 mg  150 mg Oral QHS Agbata, Tochukwu, MD      . lidocaine (LIDODERM) 5 % 1 patch  1 patch Transdermal Q24H Agbata, Tochukwu, MD   1 patch at 06/22/20 1419  . ondansetron (ZOFRAN) tablet 4 mg  4 mg Oral Q6H PRN Agbata, Tochukwu, MD       Or  . ondansetron (ZOFRAN) injection 4 mg  4 mg Intravenous Q6H PRN Agbata, Tochukwu, MD      . simvastatin (ZOCOR) tablet 40 mg  40 mg Oral QHS Agbata, Tochukwu, MD      . triamcinolone cream (KENALOG) 0.1 % 1 application  1 application Topical BID PRN Agbata, Tochukwu, MD       Current Outpatient Medications  Medication Sig Dispense Refill  . acetaminophen (TYLENOL) 500 MG tablet Take 500-1,000 mg by mouth every 6 (six) hours as needed for mild pain, moderate pain or fever.      . ALPRAZolam (  XANAX) 1 MG tablet Take 0.5-1 tablets (0.5-1 mg total) by mouth 2 (two) times daily as needed. for anxiety 60 tablet 2  . aspirin 81 MG tablet Take 81 mg by mouth every other day.     . benazepril-hydrochlorthiazide (LOTENSIN HCT) 20-12.5 MG tablet Take 1 tablet by mouth daily. 90 tablet 3  . buPROPion (WELLBUTRIN XL) 300 MG 24 hr tablet Take 1 tablet (300 mg total) by mouth daily. 90 tablet 2  . Cholecalciferol (VITAMIN D3) 2000 units TABS Take 2,000 mcg by mouth daily.    . Cyanocobalamin (B-12) 1000 MCG SUBL Place 1,000 mcg under the tongue every evening.     Marland Kitchen doxazosin (CARDURA) 2 MG tablet TAKE 1 TABLET(2 MG) BY MOUTH DAILY (Patient taking differently: Take 2 mg by mouth daily. ) 30 tablet 4  . esomeprazole (NEXIUM) 20 MG capsule Take 20 mg by mouth daily as needed (acid reflux symptoms).     . ezetimibe (ZETIA) 10 MG tablet Take 1 tablet (10 mg total) by mouth daily. (Patient taking differently: Take 100 mg by mouth every evening. ) 90 tablet 3  . gabapentin (NEURONTIN) 300 MG capsule Take 1 capsule (300 mg total) by mouth at bedtime. 90 capsule 3  . insulin glargine (LANTUS SOLOSTAR) 100 UNIT/ML Solostar Pen Inject 22-24 Units into the skin at bedtime. (Patient taking differently: Inject 20 Units into the skin at bedtime. ) 18 mL 3  . iron polysaccharides (NIFEREX) 150 MG capsule Take 150 mg by mouth at bedtime.     . metoprolol tartrate (LOPRESSOR) 25 MG tablet TAKE 1 TABLET BY MOUTH ONCE DAILY (Patient taking differently: Take 25 mg by mouth daily. ) 90 tablet 0  . NONFORMULARY OR COMPOUNDED ITEM Apply 1 application topically 2 (two) times daily as needed (sun damage on face). FLUOROURACIL 5% + CALCIPOTRIENE 0.005%    . simvastatin (ZOCOR) 40 MG tablet Take 1 tablet (40 mg total) by mouth at bedtime. 90 tablet 0  . triamcinolone cream (KENALOG) 0.1 % Apply 1 application topically 2 (two) times daily as needed (skin rash.).     Marland Kitchen Blood Glucose Monitoring Suppl (ONETOUCH  VERIO) w/Device KIT Use to check blood sugar 3 times a day 1 kit 0  . glipiZIDE (GLUCOTROL XL) 2.5 MG 24 hr tablet TAKE 1 TABLET BY MOUTH BEFORE BREAKFAST and 1-2 tablets before dinner 270 tablet 3  . glipiZIDE (GLUCOTROL) 5 MG tablet TAKE 1 TABLET(5 MG) BY MOUTH DAILY BEFORE SUPPER 90 tablet 3  . glucose blood (ONETOUCH VERIO) test strip Use to check blood sugar 3 times a day. 300 each 12  . Insulin Pen Needle 32G X 4 MM MISC Use 1x a day 100 each 3  . Lancet Devices (LANCING DEVICE) MISC Use as advised - Freestyle 1 each 0  . OneTouch Delica Lancets 15V MISC 1 each by Does not apply route 3 (three) times daily. To check blood sugar 3 times a day 300 each 11     Review of Systems Full ROS  was asked and was negative except for the information on the HPI  Physical Exam Blood pressure (!) 108/50, pulse 99, temperature 98 F (36.7 C), temperature source Oral, resp. rate 10, height 4' 11"  (1.499 m), weight 83.5 kg, SpO2 95 %. CONSTITUTIONAL: Debilitated but nontoxic EYES: Pupils are equal, round, and reactive to light, Sclera are non-icteric. EARS, NOSE, MOUTH AND THROAT: She is wearing a mask, hearing is intact to voice. LYMPH NODES:  Lymph nodes in the neck are  normal. RESPIRATORY:  Lungs are clear. There is normal respiratory effort, with equal breath sounds bilaterally, and without pathologic use of accessory muscles. CARDIOVASCULAR: Heart is regular without murmurs, gallops, or rubs. Wheezes chest wall tenderness on the left side.  No subcutaneous air GI: The abdomen is   soft, nontender, and nondistended. There are no palpable masses. There is no hepatosplenomegaly. There are normal bowel sounds in all quadrants. GU: Rectal deferred.   MUSCULOSKELETAL: Normal muscle strength and tone. No cyanosis or edema.   SKIN: Turgor is good and there are no pathologic skin lesions or ulcers. NEUROLOGIC: Motor and sensation is grossly normal. Cranial nerves are grossly intact. PSYCH:  Oriented to  person, place and time. Affect is normal.  Data Reviewed  I have personally reviewed the patient's imaging, laboratory findings and medical records.    Assessment/Plan Incidental enlarged appendix on CT scan after a fall.  Clinically not typical for appendicitis.  I am more concerned that her lymphoma might be worsening and might be involving the appendix.  I will suggest oncology consultation.  From multiple rib fracture perspective I would only suggest supportive care, pulmonary toilet and incentive spirometer.  We will continue to follow her with serial abdominal exams.  No need for emergent surgical intervention she also needs to be resuscitated due to hypovolemia related to failure to thrive.  Caroleen Hamman, MD FACS General Surgeon 06/22/2020, 2:23 PM

## 2020-06-22 NOTE — ED Notes (Addendum)
Damita Dunnings MD notified of BP 75/40 and pt's request for pain medication. Awaiting response.

## 2020-06-22 NOTE — ED Notes (Signed)
Pt requesting pain medication stronger than tylenol and naproxen. Explained to pt that her BP is low but that MD would be notified.

## 2020-06-22 NOTE — ED Triage Notes (Signed)
Pt arrived via EMS from home - Pt c/o weakness x3 weeks and dizziness x24 hours - Pt got up at 3am/became very dizzy/fell causing injury to left flank and rib area (bruising noted at this time) - Pt family came and assisted her up - This am when getting up for the day she became dizzy again/sat down/called EMS - Pt was hypotensive with EMS at 88/40, CBG 99, HR 78, O2 sat 98% RA

## 2020-06-22 NOTE — ED Notes (Signed)
ED provided notified of patient's blood pressure, ordered to give 1 liter of saline.

## 2020-06-22 NOTE — ED Notes (Signed)
Pt advised to notify this nurse when she has to void and that urine sample needs to be collected

## 2020-06-22 NOTE — ED Notes (Signed)
Pt denies being able to provide urine sample at this time. Stretcher locked in lowest position, call bell within reach

## 2020-06-22 NOTE — ED Notes (Signed)
Pt desat to mid 79s, placed on 2L [place]

## 2020-06-22 NOTE — Consult Note (Signed)
[address] [place], [place] [address] Phone [phone]. Fax [phone]  Date: 06/22/2020                  Patient Name:  Brenda [name]  MRN: [id number]  DOB: Feb 06, 1951  Age / Sex: 69 y.o., female         PCP: Ronnald Nian, DO                 Service Requesting Consult: IM/ Collier Bullock, MD                 Reason for Consult: ARF            History of Present Illness: Patient is a 69 y.o. 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  She lives by herself and does not have any family members.  She called her neighbor who helped her get to the hospital.  She feels she was down for about an hour.  She was found to have lip abrasion and left rib fracture. She reports she had some liquid stools last week. Denies any fevers, chills, shortness of breath, cough  Work-up in the emergency room showed increased creatinine of 4.63.  Serum creatinine trends are noted below Baseline creatinine appears to be 1.43/GFR 37 from January 03, 2020  Lab Results  Component Value Date   CREATININE 4.63 (H) 06/22/2020   CREATININE 1.67 (H) 05/12/2020   CREATININE 1.43 (H) 01/03/2020   Patient is noted to be hypotensive with dry mouth.  She was given 1 L normal saline bolus and is getting ready to get another bolus  Medications: Outpatient medications: (Not in a hospital admission)   Current medications: Current Facility-Administered Medications  Medication Dose Route Frequency Provider Last Rate Last Admin  . sodium chloride 0.9 % bolus 1,000 mL  1,000 mL Intravenous Once Agbata, Tochukwu, MD       Current Outpatient Medications  Medication Sig Dispense Refill  . acetaminophen (TYLENOL) 500 MG tablet Take  500-1,000 mg by mouth every 6 (six) hours as needed for mild pain, moderate pain or fever.     . ALPRAZolam (XANAX) 1 MG tablet Take 0.5-1 tablets (0.5-1 mg total) by mouth 2 (two) times daily as needed. for anxiety 60 tablet 2  . aspirin 81 MG tablet Take 81 mg by mouth every other day.     . benazepril-hydrochlorthiazide (LOTENSIN HCT) 20-12.5 MG tablet Take 1 tablet by mouth daily. 90 tablet 3  . Blood Glucose Monitoring Suppl (ONETOUCH VERIO) w/Device KIT Use to check blood sugar 3 times a day 1 kit 0  . buPROPion (WELLBUTRIN XL) 300 MG 24 hr tablet Take 1 tablet (300 mg total) by mouth daily. 90 tablet 2  . Cholecalciferol (VITAMIN D3) 2000 units TABS Take 2,000 mcg by mouth daily.    . Cyanocobalamin (B-12) 1000 MCG SUBL Place 1,000 mcg under the tongue every evening.     . cyclobenzaprine (FLEXERIL) 10 MG tablet Take 1 tablet (10 mg total) by mouth 3 (three) times daily as needed for muscle spasms. (Patient not taking: Reported on 05/12/2020) 30 tablet 1  . doxazosin (CARDURA) 2 MG tablet TAKE 1 TABLET(2 MG) BY MOUTH DAILY 30 tablet 4  .  esomeprazole (NEXIUM) 20 MG capsule Take 20 mg by mouth daily at 12 noon.    . ezetimibe (ZETIA) 10 MG tablet Take 1 tablet (10 mg total) by mouth daily. (Patient taking differently: Take 100 mg by mouth every evening. ) 90 tablet 3  . gabapentin (NEURONTIN) 300 MG capsule Take 1 capsule (300 mg total) by mouth at bedtime. 90 capsule 3  . glipiZIDE (GLUCOTROL XL) 2.5 MG 24 hr tablet TAKE 1 TABLET BY MOUTH BEFORE BREAKFAST and 1-2 tablets before dinner 270 tablet 3  . glipiZIDE (GLUCOTROL) 5 MG tablet TAKE 1 TABLET(5 MG) BY MOUTH DAILY BEFORE SUPPER 90 tablet 3  . glucose blood (ONETOUCH VERIO) test strip Use to check blood sugar 3 times a day. 300 each 12  . insulin glargine (LANTUS SOLOSTAR) 100 UNIT/ML Solostar Pen Inject 22-24 Units into the skin at bedtime. 18 mL 3  . Insulin Pen Needle 32G X 4 MM MISC Use 1x a day 100 each 3  . iron polysaccharides  (NIFEREX) 150 MG capsule Take 150 mg by mouth at bedtime.     Elmore Guise Devices (LANCING DEVICE) MISC Use as advised - Freestyle 1 each 0  . metoprolol tartrate (LOPRESSOR) 25 MG tablet TAKE 1 TABLET BY MOUTH ONCE DAILY 90 tablet 0  . NONFORMULARY OR COMPOUNDED ITEM Apply 1 application topically 2 (two) times daily as needed (Sun damage on face). FLUOROURACIL 5% + CALCIPOTRIENE 0.005%    . OneTouch Delica Lancets 27O MISC 1 each by Does not apply route 3 (three) times daily. To check blood sugar 3 times a day 300 each 11  . simvastatin (ZOCOR) 40 MG tablet Take 1 tablet (40 mg total) by mouth at bedtime. 90 tablet 0  . triamcinolone cream (KENALOG) 0.1 % Apply 1 application topically 2 (two) times daily as needed (skin rash.).         Allergies: Allergies  Allergen Reactions  . Amlodipine     Swelling    . Nsaids     Due to kidney function      Past Medical History: Past Medical History:  Diagnosis Date  . Anxiety   . Arthritis   . CLL (chronic lymphocytic leukemia) (HCC)    Dx. 2019 Dr. Irene Limbo'  . Depression    Wellbutrin  . Diabetes mellitus    Type II  . Endometrial ca ([place])    1998  . 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 GERD (gastroesophageal reflux disease)   . Heart murmur    patient states "cardiologist has not heard heart murmur for past several years"  . Heel spur   . History of hiatal hernia    small  . 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 Hyperkalemia   . Hyperlipidemia   . Hypertension   . Left leg swelling    "swelling in left leg from knee down when sitting for prolonged periods or being on feet for a long period of time"  . PONV (postoperative nausea and vomiting)    after hysterectomy  . Renal insufficiency    Stage 3 b   Dr. Particia Nearing first visit 11-2019     Past Surgical History: Past Surgical History:  Procedure  Laterality Date  . ABDOMINAL HYSTERECTOMY  1998  . COLONOSCOPY    . EYE SURGERY Bilateral    cataracts  . EYE SURGERY  Left    retina tear  . FEMUR IM NAIL  10/25/2012   Procedure: INTRAMEDULLARY (IM) NAIL FEMORAL;  Surgeon: Mauri Pole, MD;  Location: [facility];  Service: Orthopedics;  Laterality: Left;  . HIP SURGERY     Fracture L IM nail and 3 rods  . left knee arthroscopy  12/2010  . LYMPH NODE BIOPSY     axillary left 12-2018  . REFRACTIVE SURGERY    . skin cancer removed right and and lower forearm     squamoun in situ  . TOTAL KNEE ARTHROPLASTY  12/20/2011   Procedure: TOTAL KNEE ARTHROPLASTY;  Surgeon: Gearlean Alf, MD;  Location: [facility];  Service: Orthopedics;  Laterality: Left;  Failed attempt at spinal   . TOTAL SHOULDER ARTHROPLASTY Right 08/19/2016   Procedure: RIGHT TOTAL SHOULDER ARTHROPLASTY;  Surgeon: Justice Britain, MD;  Location: [facility];  Service: Orthopedics;  Laterality: Right;  . TOTAL SHOULDER ARTHROPLASTY Left 01/10/2020   Procedure: TOTAL SHOULDER ARTHROPLASTY;  Surgeon: Justice Britain, MD;  Location: [facility];  Service: Orthopedics;  Laterality: Left;  172mn     Family History: Family History  Problem Relation Age of Onset  . Cancer Mother        breast cancer  . Cancer Father        plasma sarcoma  . Breast cancer Neg Hx      Social History: Social History   Socioeconomic History  . Marital status: Single    Spouse name: Not on file  . Number of children: Not on file  . Years of education: Not on file  . Highest education level: Not on file  Occupational History  . Occupation: RTherapist, [employer]at [employer] Tobacco Use  . Smoking status: Never Smoker  . Smokeless tobacco: Never Used  Vaping Use  . Vaping Use: Never used  Substance and Sexual Activity  . Alcohol use: No  . Drug use: No  . Sexual activity: Not Currently  Other Topics Concern  . 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 Difficulty of Paying Living Expenses: Not on file  Food Insecurity:   . Worried About RCharity fundraiserin the Last Year: Not on file  . Ran Out of Food in the Last Year: Not on file  Transportation Needs:   . Lack of Transportation (Medical): Not on file  . Lack of Transportation (Non-Medical): Not on file  Physical Activity:   . Days of Exercise per Week: Not on file  . Minutes of Exercise per Session: Not on file  Stress:   . Feeling of Stress : Not on file  Social Connections:   . Frequency of Communication with Friends and Family: Not on file  . Frequency of Social Gatherings with Friends and Family: Not on file  . Attends Religious Services: Not on file  . Active Member of Clubs or Organizations: Not on file  . Attends CArchivistMeetings: Not on file  . Marital Status: Not on file  Intimate Partner Violence:   . Fear of Current or Ex-Partner: Not on file  . Emotionally Abused: Not on file  . Physically Abused: Not on file  . Sexually Abused: Not on file    Gen: Denies any fevers or chills HEENT:  No vision or hearing problems CV: No chest pain or shortness of breath Resp: No cough or sputum production GI: No nausea, vomiting.  No blood in the stool.  Loose stools last week GU : No problems with voiding.  No hematuria.  No previous history of kidney problems MS: Ambulatory at baseline.  Pain in the left chest from rib fractures Derm:   No complaints Psych: No complaints Heme: No complaints Neuro: No complaints Endocrine: No complaints   Vital Signs: Blood pressure (!) 90/41, pulse 99, temperature 98 F (36.7 C), temperature source Oral, resp. rate 15, height 4' 11"  (1.499 m), weight 83.5 kg, SpO2 95 %.   Intake/Output Summary (Last 24 hours) at 06/22/2020 1342 Last data filed at 06/22/2020 1117 Gross per 24 hour  Intake 1000 ml  Output --  Net 1000 ml    Weight trends: [employer]   06/22/20 0834  Weight: 83.5 kg   Physical Exam: General:  No acute distress, laying in the bed, frail appearing  HEENT  anicteric, dry oral mucous membrane  Pulm/lungs  normal breathing effort, lungs are clear to auscultation  CVS/Heart  regular rhythm, no rub or gallop  Abdomen:   Soft, nontender  Extremities:  No peripheral edema  Neurologic:  Alert, oriented, able to follow commands  Skin:  No acute rashes, decreased turgor   Lab results: Basic Metabolic Panel: Recent Labs  Lab 06/22/20 0845  NA 132*  K 3.8  CL 98  CO2 19*  GLUCOSE 90  BUN 77*  CREATININE 4.63*  CALCIUM 7.8*    Liver Function Tests: Recent Labs  Lab 06/22/20 0845  AST 15  ALT 12  ALKPHOS 71  BILITOT 0.9  PROT 5.8*  ALBUMIN 3.4*   No results for input(s): LIPASE, AMYLASE in the last 168 hours. No results for input(s): AMMONIA in the last 168 hours.  CBC: Recent Labs  Lab 06/22/20 0845  WBC 15.4*  HGB 10.9*  HCT 33.8*  MCV 88.9  PLT 160    Cardiac Enzymes: No results for input(s): CKTOTAL, TROPONINI in the last 168 hours.  BNP: Invalid input(s): POCBNP  CBG: No results for input(s): GLUCAP in the last 168 hours.  Microbiology: Recent Results (from the past 720 hour(s))  SARS Coronavirus 2 by RT PCR (hospital order, performed in [facility] hospital lab) Nasopharyngeal Nasopharyngeal Swab     Status: None   Collection Time: 06/22/20 11:44 AM   Specimen: Nasopharyngeal Swab  Result Value Ref Range Status   SARS Coronavirus 2 NEGATIVE NEGATIVE Final    Comment: (NOTE) SARS-CoV-2 target nucleic acids are NOT DETECTED.  The SARS-CoV-2 RNA is generally detectable in upper and lower respiratory specimens during the acute phase of infection. The lowest concentration of SARS-CoV-2 viral copies this assay can detect is 250 copies / mL. A negative result does not preclude SARS-CoV-2 infection and should not be used as the sole basis for treatment or other patient  management decisions.  A negative result may occur with improper specimen collection / handling, submission of specimen other than nasopharyngeal swab, presence of viral mutation(s) within the areas targeted by this assay, and inadequate number of viral copies (<250 copies / mL). A negative result must be combined with clinical observations, patient history, and epidemiological information.  Fact Sheet for Patients:   StrictlyIdeas.no  Fact Sheet for Healthcare Providers: BankingDealers.co.za  This test is not yet approved or  cleared by the [place] FDA and has been authorized for detection and/or diagnosis  of SARS-CoV-2 by FDA under an Emergency Use Authorization (EUA).  This EUA will remain in effect (meaning this test can be used) for the duration of the COVID-19 declaration under Section 564(b)(1) of the Act, 21 U.S.C. section 360bbb-3(b)(1), unless the authorization is terminated or revoked sooner.  Performed at [facility], [address] [place], [place] [address]      Coagulation Studies: No results for input(s): LABPROT, INR in the last 72 hours.  Urinalysis: No results for input(s): COLORURINE, LABSPEC, PHURINE, GLUCOSEU, HGBUR, BILIRUBINUR, KETONESUR, PROTEINUR, UROBILINOGEN, NITRITE, LEUKOCYTESUR in the last 72 hours.  Invalid input(s): APPERANCEUR      Imaging: CT ABDOMEN PELVIS WO CONTRAST  Result Date: 06/22/2020 CLINICAL DATA:  Fall with left flank and rib injury with bruising. Weakness and dizziness. EXAM: CT CHEST, ABDOMEN AND PELVIS WITHOUT CONTRAST TECHNIQUE: Multidetector CT imaging of the chest, abdomen and pelvis was performed following the standard protocol without IV contrast. COMPARISON:  Chest radiograph from earlier today. History of lymphoma. FINDINGS: CT CHEST FINDINGS Cardiovascular: Stable mild cardiomegaly. Stable trace pericardial effusion/thickening. Three-vessel coronary  atherosclerosis. Atherosclerotic nonaneurysmal thoracic aorta. No acute intramural hematoma in the thoracic aorta. Normal caliber pulmonary arteries. Mediastinum/Nodes: No pneumomediastinum. No mediastinal hematoma. No discrete thyroid nodules. Unremarkable esophagus. Moderate bilateral axillary/retropectoral lymphadenopathy, increased. Representative 1.9 cm left axillary node (series 2/image 10), increased from 1.2 cm on 05/01/2018 CT. Representative 1.9 cm right axillary node (series 2/image 18), increased from 1.5 cm. Enlarged 1.9 cm subcarinal node (series 2/image 25), increased from 1.5 cm. New mild bilateral paratracheal adenopathy up to 1.1 cm on the left (series 2/image 16). No discrete hilar adenopathy on these noncontrast images. Lungs/Pleura: No pneumothorax. No pleural effusion. No acute consolidative airspace disease or lung masses. Mild platelike atelectasis at the dependent lung bases bilaterally. Numerous (greater than 20) solid pulmonary nodules scattered throughout both lungs, mildly increased. Representative 0.9 cm medial right middle lobe nodule (series 4/image 73), increased from 0.7 cm. Representative 0.7 cm anterior right lower lobe nodule (series 4/image 65), increased from 0.6 cm. Representative 0.4 cm superior segment left lower lobe nodule (series 4/image 55), previously 0.3 cm. Musculoskeletal: No aggressive appearing focal osseous lesions. Acute nondisplaced anterior left fifth, sixth, seventh and eighth rib fractures. Marked thoracic spondylosis. Bilateral shoulder arthroplasties, partially visualized. CT ABDOMEN PELVIS FINDINGS Hepatobiliary: Normal liver with no liver mass. Cholelithiasis. No biliary ductal dilatation. Pancreas: Normal, with no laceration, mass or duct dilation. Spleen: Normal size spleen.  No splenic mass. Adrenals/Urinary Tract: Normal adrenals. No hydronephrosis. No contour deforming renal masses. No renal stones. Normal bladder. Stomach/Bowel: Grossly normal  stomach. Normal caliber small bowel with no small bowel wall thickening. Diffusely dilated and thick-walled appendix (14 mm diameter) with prominent periappendiceal fat stranding adjacent to the mid to distal appendix, suggesting acute appendicitis. No appendicolith. No periappendiceal free air or measurable collection. Normal large bowel with no diverticulosis, large bowel wall thickening or pericolonic fat stranding. Vascular/Lymphatic: Atherosclerotic nonaneurysmal abdominal aorta. Porta hepatis, aortocaval, left para-aortic and bulky bilateral iliac lymphadenopathy is increased. Representative 2.1 cm porta hepatis node (series 2/image 50), previously 1.9 cm. Representative 1.7 cm left para-aortic node (series 2/image 74), previously 1.2 cm. Representative 4.0 cm left external iliac node (series 2/image 99), previously 3.3 cm. Reproductive: Status post hysterectomy, with no abnormal findings at the vaginal cuff. No adnexal mass. Other: No pneumoperitoneum, ascites or focal fluid collection. Musculoskeletal: No aggressive appearing focal osseous lesions. No fracture in the abdomen or pelvis. Partially visualized fixation hardware in the proximal  left femur. Marked lumbar spondylosis. IMPRESSION: 1. Acute nondisplaced anterior left fifth, sixth, seventh and eighth rib fractures. No pneumothorax. 2. No acute traumatic injury in the abdomen or pelvis. 3. CT findings suggestive of acute appendicitis. No free air or measurable abscess on this noncontrast scan. Surgical consultation advised. 4. Widespread lymphadenopathy, increased since 05/01/2018 CT as detailed, compatible with progression of lymphoma. 5. Numerous nonspecific chronic solid pulmonary nodules scattered throughout both lungs, mildly increased, also potentially due to lymphoma. 6. Stable mild cardiomegaly. Three-vessel coronary atherosclerosis. 7. Cholelithiasis. 8. Aortic Atherosclerosis (ICD10-I70.0). These results were called by telephone at the time  of interpretation on 06/22/2020 at 11:20 am to provider Lavonia Drafts, who verbally acknowledged these results. Electronically Signed   By: Ilona Sorrel M.D.   On: 06/22/2020 11:23   CT Chest Wo Contrast  Result Date: 06/22/2020 CLINICAL DATA:  Fall with left flank and rib injury with bruising. Weakness and dizziness. EXAM: CT CHEST, ABDOMEN AND PELVIS WITHOUT CONTRAST TECHNIQUE: Multidetector CT imaging of the chest, abdomen and pelvis was performed following the standard protocol without IV contrast. COMPARISON:  Chest radiograph from earlier today. History of lymphoma. FINDINGS: CT CHEST FINDINGS Cardiovascular: Stable mild cardiomegaly. Stable trace pericardial effusion/thickening. Three-vessel coronary atherosclerosis. Atherosclerotic nonaneurysmal thoracic aorta. No acute intramural hematoma in the thoracic aorta. Normal caliber pulmonary arteries. Mediastinum/Nodes: No pneumomediastinum. No mediastinal hematoma. No discrete thyroid nodules. Unremarkable esophagus. Moderate bilateral axillary/retropectoral lymphadenopathy, increased. Representative 1.9 cm left axillary node (series 2/image 10), increased from 1.2 cm on 05/01/2018 CT. Representative 1.9 cm right axillary node (series 2/image 18), increased from 1.5 cm. Enlarged 1.9 cm subcarinal node (series 2/image 25), increased from 1.5 cm. New mild bilateral paratracheal adenopathy up to 1.1 cm on the left (series 2/image 16). No discrete hilar adenopathy on these noncontrast images. Lungs/Pleura: No pneumothorax. No pleural effusion. No acute consolidative airspace disease or lung masses. Mild platelike atelectasis at the dependent lung bases bilaterally. Numerous (greater than 20) solid pulmonary nodules scattered throughout both lungs, mildly increased. Representative 0.9 cm medial right middle lobe nodule (series 4/image 73), increased from 0.7 cm. Representative 0.7 cm anterior right lower lobe nodule (series 4/image 65), increased from 0.6 cm.  Representative 0.4 cm superior segment left lower lobe nodule (series 4/image 55), previously 0.3 cm. Musculoskeletal: No aggressive appearing focal osseous lesions. Acute nondisplaced anterior left fifth, sixth, seventh and eighth rib fractures. Marked thoracic spondylosis. Bilateral shoulder arthroplasties, partially visualized. CT ABDOMEN PELVIS FINDINGS Hepatobiliary: Normal liver with no liver mass. Cholelithiasis. No biliary ductal dilatation. Pancreas: Normal, with no laceration, mass or duct dilation. Spleen: Normal size spleen.  No splenic mass. Adrenals/Urinary Tract: Normal adrenals. No hydronephrosis. No contour deforming renal masses. No renal stones. Normal bladder. Stomach/Bowel: Grossly normal stomach. Normal caliber small bowel with no small bowel wall thickening. Diffusely dilated and thick-walled appendix (14 mm diameter) with prominent periappendiceal fat stranding adjacent to the mid to distal appendix, suggesting acute appendicitis. No appendicolith. No periappendiceal free air or measurable collection. Normal large bowel with no diverticulosis, large bowel wall thickening or pericolonic fat stranding. Vascular/Lymphatic: Atherosclerotic nonaneurysmal abdominal aorta. Porta hepatis, aortocaval, left para-aortic and bulky bilateral iliac lymphadenopathy is increased. Representative 2.1 cm porta hepatis node (series 2/image 50), previously 1.9 cm. Representative 1.7 cm left para-aortic node (series 2/image 74), previously 1.2 cm. Representative 4.0 cm left external iliac node (series 2/image 99), previously 3.3 cm. Reproductive: Status post hysterectomy, with no abnormal findings at the vaginal cuff. No adnexal mass. Other: No pneumoperitoneum, ascites or  focal fluid collection. Musculoskeletal: No aggressive appearing focal osseous lesions. No fracture in the abdomen or pelvis. Partially visualized fixation hardware in the proximal left femur. Marked lumbar spondylosis. IMPRESSION: 1. Acute  nondisplaced anterior left fifth, sixth, seventh and eighth rib fractures. No pneumothorax. 2. No acute traumatic injury in the abdomen or pelvis. 3. CT findings suggestive of acute appendicitis. No free air or measurable abscess on this noncontrast scan. Surgical consultation advised. 4. Widespread lymphadenopathy, increased since 05/01/2018 CT as detailed, compatible with progression of lymphoma. 5. Numerous nonspecific chronic solid pulmonary nodules scattered throughout both lungs, mildly increased, also potentially due to lymphoma. 6. Stable mild cardiomegaly. Three-vessel coronary atherosclerosis. 7. Cholelithiasis. 8. Aortic Atherosclerosis (ICD10-I70.0). These results were called by telephone at the time of interpretation on 06/22/2020 at 11:20 am to provider Lavonia Drafts, who verbally acknowledged these results. Electronically Signed   By: Ilona Sorrel M.D.   On: 06/22/2020 11:23   DG Chest Port 1 View  Result Date: 06/22/2020 CLINICAL DATA:  69 year old female with history of weakness for the past 3 weeks and dizziness for the past 24 hours. EXAM: PORTABLE CHEST 1 VIEW COMPARISON:  Chest x-ray 10/24/2012. FINDINGS: Lung volumes are low. No consolidative airspace disease. No pleural effusions. No pneumothorax. No pulmonary nodule or mass noted. Pulmonary vasculature and the cardiomediastinal silhouette are within normal limits. Atherosclerosis in the thoracic aorta. Status post bilateral shoulder arthroplasty. IMPRESSION: 1. Low lung volumes without radiographic evidence of acute cardiopulmonary disease. 2. Aortic atherosclerosis. Electronically Signed   By: Vinnie Langton M.D.   On: 06/22/2020 09:09      Assessment & Plan: Pt is a 69 y.o. Caucasian  female with CLL, depression, diabetes, anxiety, arthritis, CKD, hypertension, arthritis with history of knee and shoulder arthroplasty, was admitted on 06/22/2020 with AKI (acute kidney injury) ([place]) [N17.9]   #AKI on CKD stage IIIb Baseline  creatinine of 1.43/GFR 37 from March 2021  admission creatinine of 4.63 Await urinalysis Imaging: CT of the abdomen and pelvis without IV contrast shows atherosclerotic nonaneurysmal abdominal aorta, bulky bilateral iliac lymphadenopathy, left rib fractures, three-vessel coronary atherosclerosis, aortic atherosclerosis, -  negative for hydronephrosis, renal masses or renal stones.  AKI is most likely secondary to volume depletion and hypotension.  Patient was taking benazepril/HCTZ.  Diuretic may have contributed to volume depletion.  Recommend: Hold benazepril/HCTZ When creatinine returns to normal, can consider starting benazepril at lower dose based on blood pressure. Use HCTZ as needed only for lower extremity edema Agree with hydration with IV normal saline and monitoring blood pressure and renal parameters closely during hospitalization      LOS: 0 Gibran Veselka 8/22/20211:42 PM    Note: This note was prepared with Dragon dictation. Any transcription errors are unintentional

## 2020-06-22 NOTE — H&P (Addendum)
History and Physical    Brenda [name] LMB:[id number] DOB: 21-Jun-1951 DOA: 06/22/2020  PCP: Ronnald Nian, DO   Patient coming from: Home  I have personally briefly reviewed patient's old medical records in [place]  Chief Complaint: Dizziness and weakness  HPI: Brenda [name] is a 69 y.o. female with medical history significant for CLL, diabetes mellitus with complications of stage III chronic kidney disease who presents to the ER via EMS for evaluation of weakness and dizziness which has progressively worsened over the last 3 weeks.  Patient states that she got up around 3 AM to change the batteries in remote control and became very dizzy and fell landing on her left side.  She was unable to get up and called her neighbor who assisted her up. She continued to feel dizzy and lightheaded and so EMS was called.  Per EMS patient was hypotensive with blood pressure of 88/40, HR 78 on room air pulse oximetry of 98%.  Blood sugar was 99 She denies having any chest pain, shortness of breath, abdominal pain, no nausea or vomiting. She states that she had diarrhea for about a week Labs reveal sodium 132, potassium 3.8, chloride 98, bicarb of 19, BUN of 77, creatinine of 4.63, white count of 15.4, hemoglobin 10.9,, platelet count of 160.    Patient had a CT scan of abdomen and pelvis which showed acute nondisplaced anterior left fifth sixth seventh and eighth rib fractures.  No pneumothorax.  CT scan finding suggestive of acute appendicitis.  No free air or measurable abscess on this noncontrast scan.  Widespread lymphadenopathy increased since July 2019 compatible with progression of lymphoma. Numerous nonspecific chronic solid pulmonary nodules scattered throughout both lungs. Chest x-ray reviewed by me shows no acute infiltrate or effusion. Twelve-lead EKG reviewed by me shows sinus rhythm.  Old inferior infarct    ED Course: Patient is a 69 year old female with a known history of  CLL, hypertension, diabetes mellitus with complications of chronic kidney disease who was brought into the ER by EMS after a fall earlier this morning for evaluation of weakness and dizziness.  Patient noted to have acute kidney injury and has a serum creatinine of 4.63 compared to baseline of 1.67.  She has multiple rib fractures following the fall and CT scan findings suggestive of possible acute appendicitis.  She will be admitted to the hospital for further evaluation.  Review of Systems: As per HPI otherwise 10 point review of systems negative.    Past Medical History:  Diagnosis Date  . Anxiety   . Arthritis   . CLL (chronic lymphocytic leukemia) (HCC)    Dx. 2019 Dr. Irene Limbo'  . Depression    Wellbutrin  . Diabetes mellitus    Type II  . Endometrial ca ([place])    1998  . 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 GERD (gastroesophageal reflux disease)   . Heart murmur    patient states "cardiologist has not heard heart murmur for past several years"  . Heel spur   . History of hiatal hernia    small  . 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 Hyperkalemia   . Hyperlipidemia   . Hypertension   . Left leg swelling    "swelling in left leg from knee down when sitting for prolonged periods or being on feet for  a long period of time"  . PONV (postoperative nausea and vomiting)    after hysterectomy  . Renal insufficiency    Stage 3 b   Dr. Particia Nearing first visit 11-2019    Past Surgical History:  Procedure Laterality Date  . ABDOMINAL HYSTERECTOMY  1998  . COLONOSCOPY    . EYE SURGERY Bilateral    cataracts  . EYE SURGERY Left    retina tear  . FEMUR IM NAIL  10/25/2012   Procedure: INTRAMEDULLARY (IM) NAIL FEMORAL;  Surgeon: Mauri Pole, MD;  Location: [facility];  Service: Orthopedics;  Laterality: Left;  . HIP SURGERY     Fracture L IM nail and 3 rods    . left knee arthroscopy  12/2010  . LYMPH NODE BIOPSY     axillary left 12-2018  . REFRACTIVE SURGERY    . skin cancer removed right and and lower forearm     squamoun in situ  . TOTAL KNEE ARTHROPLASTY  12/20/2011   Procedure: TOTAL KNEE ARTHROPLASTY;  Surgeon: Gearlean Alf, MD;  Location: [facility];  Service: Orthopedics;  Laterality: Left;  Failed attempt at spinal   . TOTAL SHOULDER ARTHROPLASTY Right 08/19/2016   Procedure: RIGHT TOTAL SHOULDER ARTHROPLASTY;  Surgeon: Justice Britain, MD;  Location: [place];  Service: Orthopedics;  Laterality: Right;  . TOTAL SHOULDER ARTHROPLASTY Left 01/10/2020   Procedure: TOTAL SHOULDER ARTHROPLASTY;  Surgeon: Justice Britain, MD;  Location: [facility];  Service: Orthopedics;  Laterality: Left;  143mn     reports that she has never smoked. She has never used smokeless tobacco. She reports that she does not drink alcohol and does not use drugs.  Allergies  Allergen Reactions  . Amlodipine     Swelling    . Nsaids     Due to kidney function    Family History  Problem Relation Age of Onset  . Cancer Mother        breast cancer  . Cancer Father        plasma sarcoma  . Breast cancer Neg Hx      Prior to Admission medications   Medication Sig Start Date End Date Taking? Authorizing Provider  acetaminophen (TYLENOL) 500 MG tablet Take 500 mg by mouth every 6 (six) hours as needed.    [provider]  ALPRAZolam (Duanne Moron 1 MG tablet Take 0.5-1 tablets (0.5-1 mg total) by mouth 2 (two) times daily as needed. for anxiety 06/11/20   CRonnald Nian DO  aspirin 81 MG tablet Take 81 mg by mouth every other day.     [provider]  benazepril-hydrochlorthiazide (LOTENSIN HCT) 20-12.5 MG tablet Take 1 tablet by mouth daily. 05/15/20   Cirigliano, MGarvin Fila DO  Blood Glucose Monitoring Suppl (ONETOUCH VERIO) w/Device KIT Use to check blood sugar 3 times a day 08/06/19   GPhilemon Kingdom MD  buPROPion (WELLBUTRIN XL) 300 MG 24 hr tablet  Take 1 tablet (300 mg total) by mouth daily. 12/10/19   ALucille Passy MD  Cholecalciferol (VITAMIN D3) 2000 units TABS Take 2,000 mcg by mouth daily.    [provider]  Cyanocobalamin (B-12) 1000 MCG SUBL Place 1,000 mcg under the tongue every evening.  12/15/17   [provider]  cyclobenzaprine (FLEXERIL) 10 MG tablet Take 1 tablet (10 mg total) by mouth 3 (three) times daily as needed for muscle spasms. Patient not taking: Reported on 05/12/2020 01/11/20   Shuford, TOlivia Mackie PA-C  doxazosin (CARDURA) 2 MG tablet TAKE 1  TABLET(2 MG) BY MOUTH DAILY 05/26/20   Minna Merritts, MD  esomeprazole (NEXIUM) 20 MG capsule Take 20 mg by mouth daily at 12 noon.    [provider]  ezetimibe (ZETIA) 10 MG tablet Take 1 tablet (10 mg total) by mouth daily. Patient taking differently: Take 100 mg by mouth every evening.  11/26/19   Minna Merritts, MD  gabapentin (NEURONTIN) 300 MG capsule Take 1 capsule (300 mg total) by mouth at bedtime. 12/04/19   Lucille Passy, MD  glipiZIDE (GLUCOTROL XL) 2.5 MG 24 hr tablet TAKE 1 TABLET BY MOUTH BEFORE BREAKFAST and 1-2 tablets before dinner 05/16/20   Philemon Kingdom, MD  glipiZIDE (GLUCOTROL) 5 MG tablet TAKE 1 TABLET(5 MG) BY MOUTH DAILY BEFORE SUPPER 02/25/20   Philemon Kingdom, MD  glucose blood (ONETOUCH VERIO) test strip Use to check blood sugar 3 times a day. 08/06/19   Philemon Kingdom, MD  insulin glargine (LANTUS SOLOSTAR) 100 UNIT/ML Solostar Pen Inject 22-24 Units into the skin at bedtime. 03/05/20   Philemon Kingdom, MD  Insulin Pen Needle 32G X 4 MM MISC Use 1x a day 07/02/19   Philemon Kingdom, MD  iron polysaccharides (NIFEREX) 150 MG capsule Take 150 mg by mouth at bedtime.     [provider]  Lancet Devices (LANCING DEVICE) MISC Use as advised - Freestyle 05/29/19   Philemon Kingdom, MD  metoprolol tartrate (LOPRESSOR) 25 MG tablet TAKE 1 TABLET BY MOUTH ONCE DAILY 05/26/20   Minna Merritts, MD  NONFORMULARY OR  COMPOUNDED ITEM Apply 1 application topically 2 (two) times daily as needed (Sun damage on face). FLUOROURACIL 5% + CALCIPOTRIENE 0.005% 08/15/19   [provider]  OneTouch Delica Lancets 58I MISC 1 each by Does not apply route 3 (three) times daily. To check blood sugar 3 times a day 11/07/19   Philemon Kingdom, MD  simvastatin (ZOCOR) 40 MG tablet Take 1 tablet (40 mg total) by mouth at bedtime. 04/04/19   Minna Merritts, MD  triamcinolone cream (KENALOG) 0.1 % Apply 1 application topically 2 (two) times daily as needed (skin rash.).  08/14/19   [provider]    Physical Exam: Vitals:   06/22/20 0930 06/22/20 1000 06/22/20 1200 06/22/20 1330  BP: (!) 93/51 (!) 105/55 107/81 (!) 90/41  Pulse: 89 89 99   Resp: 19 (!) 23 17 15   Temp:      TempSrc:      SpO2: 95% 96% 95%   Weight:      Height:         Vitals:   06/22/20 0930 06/22/20 1000 06/22/20 1200 06/22/20 1330  BP: (!) 93/51 (!) 105/55 107/81 (!) 90/41  Pulse: 89 89 99   Resp: 19 (!) 23 17 15   Temp:      TempSrc:      SpO2: 95% 96% 95%   Weight:      Height:        Constitutional: NAD, alert and oriented x 3.  Chronically ill-appearing.  Frail Eyes: PERRL, lids and conjunctivae normal ENMT: Mucous membranes are moist. Bruise on her lip Neck: normal, supple, no masses, no thyromegaly Respiratory: clear to auscultation bilaterally, no wheezing, no crackles. Normal respiratory effort. No accessory muscle use.  Cardiovascular: Regular rate and rhythm, no murmurs / rubs / gallops. No extremity edema. 2+ pedal pulses. No carotid bruits.  Abdomen: no tenderness, no masses palpated. No hepatosplenomegaly. Bowel sounds positive.  Musculoskeletal: no clubbing / cyanosis. No joint deformity  upper and lower extremities.  Skin: no rashes, lesions, ulcers.  Neurologic: Generalized weakness Psychiatric: Flat affect.   Labs on Admission: I have personally reviewed following labs and imaging  studies  CBC: Recent Labs  Lab 06/22/20 0845  WBC 15.4*  HGB 10.9*  HCT 33.8*  MCV 88.9  PLT 676   Basic Metabolic Panel: Recent Labs  Lab 06/22/20 0845  NA 132*  K 3.8  CL 98  CO2 19*  GLUCOSE 90  BUN 77*  CREATININE 4.63*  CALCIUM 7.8*   GFR: Estimated Creatinine Clearance: 10.4 mL/min (A) (by C-G formula based on SCr of 4.63 mg/dL (H)). Liver Function Tests: Recent Labs  Lab 06/22/20 0845  AST 15  ALT 12  ALKPHOS 71  BILITOT 0.9  PROT 5.8*  ALBUMIN 3.4*   No results for input(s): LIPASE, AMYLASE in the last 168 hours. No results for input(s): AMMONIA in the last 168 hours. Coagulation Profile: No results for input(s): INR, PROTIME in the last 168 hours. Cardiac Enzymes: No results for input(s): CKTOTAL, CKMB, CKMBINDEX, TROPONINI in the last 168 hours. BNP (last 3 results) No results for input(s): PROBNP in the last 8760 hours. HbA1C: No results for input(s): HGBA1C in the last 72 hours. CBG: No results for input(s): GLUCAP in the last 168 hours. Lipid Profile: No results for input(s): CHOL, HDL, LDLCALC, TRIG, CHOLHDL, LDLDIRECT in the last 72 hours. Thyroid Function Tests: No results for input(s): TSH, T4TOTAL, FREET4, T3FREE, THYROIDAB in the last 72 hours. Anemia Panel: No results for input(s): VITAMINB12, FOLATE, FERRITIN, TIBC, IRON, RETICCTPCT in the last 72 hours. Urine analysis:    Component Value Date/Time   COLORURINE YELLOW 10/24/2012 2009   APPEARANCEUR CLEAR 10/24/2012 2009   LABSPEC 1.027 10/24/2012 2009   PHURINE 5.0 10/24/2012 2009   GLUCOSEU NEGATIVE 10/24/2012 2009   HGBUR NEGATIVE 10/24/2012 2009   BILIRUBINUR NEGATIVE 10/24/2012 2009   KETONESUR NEGATIVE 10/24/2012 2009   PROTEINUR NEGATIVE 10/24/2012 2009   UROBILINOGEN 0.2 10/24/2012 2009   NITRITE NEGATIVE 10/24/2012 2009   LEUKOCYTESUR NEGATIVE 10/24/2012 2009    Radiological Exams on Admission: CT ABDOMEN PELVIS WO CONTRAST  Result Date: 06/22/2020 CLINICAL DATA:   Fall with left flank and rib injury with bruising. Weakness and dizziness. EXAM: CT CHEST, ABDOMEN AND PELVIS WITHOUT CONTRAST TECHNIQUE: Multidetector CT imaging of the chest, abdomen and pelvis was performed following the standard protocol without IV contrast. COMPARISON:  Chest radiograph from earlier today. History of lymphoma. FINDINGS: CT CHEST FINDINGS Cardiovascular: Stable mild cardiomegaly. Stable trace pericardial effusion/thickening. Three-vessel coronary atherosclerosis. Atherosclerotic nonaneurysmal thoracic aorta. No acute intramural hematoma in the thoracic aorta. Normal caliber pulmonary arteries. Mediastinum/Nodes: No pneumomediastinum. No mediastinal hematoma. No discrete thyroid nodules. Unremarkable esophagus. Moderate bilateral axillary/retropectoral lymphadenopathy, increased. Representative 1.9 cm left axillary node (series 2/image 10), increased from 1.2 cm on 05/01/2018 CT. Representative 1.9 cm right axillary node (series 2/image 18), increased from 1.5 cm. Enlarged 1.9 cm subcarinal node (series 2/image 25), increased from 1.5 cm. New mild bilateral paratracheal adenopathy up to 1.1 cm on the left (series 2/image 16). No discrete hilar adenopathy on these noncontrast images. Lungs/Pleura: No pneumothorax. No pleural effusion. No acute consolidative airspace disease or lung masses. Mild platelike atelectasis at the dependent lung bases bilaterally. Numerous (greater than 20) solid pulmonary nodules scattered throughout both lungs, mildly increased. Representative 0.9 cm medial right middle lobe nodule (series 4/image 73), increased from 0.7 cm. Representative 0.7 cm anterior right lower lobe nodule (series 4/image 65), increased from 0.6  cm. Representative 0.4 cm superior segment left lower lobe nodule (series 4/image 55), previously 0.3 cm. Musculoskeletal: No aggressive appearing focal osseous lesions. Acute nondisplaced anterior left fifth, sixth, seventh and eighth rib fractures.  Marked thoracic spondylosis. Bilateral shoulder arthroplasties, partially visualized. CT ABDOMEN PELVIS FINDINGS Hepatobiliary: Normal liver with no liver mass. Cholelithiasis. No biliary ductal dilatation. Pancreas: Normal, with no laceration, mass or duct dilation. Spleen: Normal size spleen.  No splenic mass. Adrenals/Urinary Tract: Normal adrenals. No hydronephrosis. No contour deforming renal masses. No renal stones. Normal bladder. Stomach/Bowel: Grossly normal stomach. Normal caliber small bowel with no small bowel wall thickening. Diffusely dilated and thick-walled appendix (14 mm diameter) with prominent periappendiceal fat stranding adjacent to the mid to distal appendix, suggesting acute appendicitis. No appendicolith. No periappendiceal free air or measurable collection. Normal large bowel with no diverticulosis, large bowel wall thickening or pericolonic fat stranding. Vascular/Lymphatic: Atherosclerotic nonaneurysmal abdominal aorta. Porta hepatis, aortocaval, left para-aortic and bulky bilateral iliac lymphadenopathy is increased. Representative 2.1 cm porta hepatis node (series 2/image 50), previously 1.9 cm. Representative 1.7 cm left para-aortic node (series 2/image 74), previously 1.2 cm. Representative 4.0 cm left external iliac node (series 2/image 99), previously 3.3 cm. Reproductive: Status post hysterectomy, with no abnormal findings at the vaginal cuff. No adnexal mass. Other: No pneumoperitoneum, ascites or focal fluid collection. Musculoskeletal: No aggressive appearing focal osseous lesions. No fracture in the abdomen or pelvis. Partially visualized fixation hardware in the proximal left femur. Marked lumbar spondylosis. IMPRESSION: 1. Acute nondisplaced anterior left fifth, sixth, seventh and eighth rib fractures. No pneumothorax. 2. No acute traumatic injury in the abdomen or pelvis. 3. CT findings suggestive of acute appendicitis. No free air or measurable abscess on this noncontrast  scan. Surgical consultation advised. 4. Widespread lymphadenopathy, increased since 05/01/2018 CT as detailed, compatible with progression of lymphoma. 5. Numerous nonspecific chronic solid pulmonary nodules scattered throughout both lungs, mildly increased, also potentially due to lymphoma. 6. Stable mild cardiomegaly. Three-vessel coronary atherosclerosis. 7. Cholelithiasis. 8. Aortic Atherosclerosis (ICD10-I70.0). These results were called by telephone at the time of interpretation on 06/22/2020 at 11:20 am to provider Lavonia Drafts, who verbally acknowledged these results. Electronically Signed   By: Ilona Sorrel M.D.   On: 06/22/2020 11:23   CT Chest Wo Contrast  Result Date: 06/22/2020 CLINICAL DATA:  Fall with left flank and rib injury with bruising. Weakness and dizziness. EXAM: CT CHEST, ABDOMEN AND PELVIS WITHOUT CONTRAST TECHNIQUE: Multidetector CT imaging of the chest, abdomen and pelvis was performed following the standard protocol without IV contrast. COMPARISON:  Chest radiograph from earlier today. History of lymphoma. FINDINGS: CT CHEST FINDINGS Cardiovascular: Stable mild cardiomegaly. Stable trace pericardial effusion/thickening. Three-vessel coronary atherosclerosis. Atherosclerotic nonaneurysmal thoracic aorta. No acute intramural hematoma in the thoracic aorta. Normal caliber pulmonary arteries. Mediastinum/Nodes: No pneumomediastinum. No mediastinal hematoma. No discrete thyroid nodules. Unremarkable esophagus. Moderate bilateral axillary/retropectoral lymphadenopathy, increased. Representative 1.9 cm left axillary node (series 2/image 10), increased from 1.2 cm on 05/01/2018 CT. Representative 1.9 cm right axillary node (series 2/image 18), increased from 1.5 cm. Enlarged 1.9 cm subcarinal node (series 2/image 25), increased from 1.5 cm. New mild bilateral paratracheal adenopathy up to 1.1 cm on the left (series 2/image 16). No discrete hilar adenopathy on these noncontrast images.  Lungs/Pleura: No pneumothorax. No pleural effusion. No acute consolidative airspace disease or lung masses. Mild platelike atelectasis at the dependent lung bases bilaterally. Numerous (greater than 20) solid pulmonary nodules scattered throughout both lungs, mildly increased. Representative 0.9 cm medial  right middle lobe nodule (series 4/image 73), increased from 0.7 cm. Representative 0.7 cm anterior right lower lobe nodule (series 4/image 65), increased from 0.6 cm. Representative 0.4 cm superior segment left lower lobe nodule (series 4/image 55), previously 0.3 cm. Musculoskeletal: No aggressive appearing focal osseous lesions. Acute nondisplaced anterior left fifth, sixth, seventh and eighth rib fractures. Marked thoracic spondylosis. Bilateral shoulder arthroplasties, partially visualized. CT ABDOMEN PELVIS FINDINGS Hepatobiliary: Normal liver with no liver mass. Cholelithiasis. No biliary ductal dilatation. Pancreas: Normal, with no laceration, mass or duct dilation. Spleen: Normal size spleen.  No splenic mass. Adrenals/Urinary Tract: Normal adrenals. No hydronephrosis. No contour deforming renal masses. No renal stones. Normal bladder. Stomach/Bowel: Grossly normal stomach. Normal caliber small bowel with no small bowel wall thickening. Diffusely dilated and thick-walled appendix (14 mm diameter) with prominent periappendiceal fat stranding adjacent to the mid to distal appendix, suggesting acute appendicitis. No appendicolith. No periappendiceal free air or measurable collection. Normal large bowel with no diverticulosis, large bowel wall thickening or pericolonic fat stranding. Vascular/Lymphatic: Atherosclerotic nonaneurysmal abdominal aorta. Porta hepatis, aortocaval, left para-aortic and bulky bilateral iliac lymphadenopathy is increased. Representative 2.1 cm porta hepatis node (series 2/image 50), previously 1.9 cm. Representative 1.7 cm left para-aortic node (series 2/image 74), previously 1.2 cm.  Representative 4.0 cm left external iliac node (series 2/image 99), previously 3.3 cm. Reproductive: Status post hysterectomy, with no abnormal findings at the vaginal cuff. No adnexal mass. Other: No pneumoperitoneum, ascites or focal fluid collection. Musculoskeletal: No aggressive appearing focal osseous lesions. No fracture in the abdomen or pelvis. Partially visualized fixation hardware in the proximal left femur. Marked lumbar spondylosis. IMPRESSION: 1. Acute nondisplaced anterior left fifth, sixth, seventh and eighth rib fractures. No pneumothorax. 2. No acute traumatic injury in the abdomen or pelvis. 3. CT findings suggestive of acute appendicitis. No free air or measurable abscess on this noncontrast scan. Surgical consultation advised. 4. Widespread lymphadenopathy, increased since 05/01/2018 CT as detailed, compatible with progression of lymphoma. 5. Numerous nonspecific chronic solid pulmonary nodules scattered throughout both lungs, mildly increased, also potentially due to lymphoma. 6. Stable mild cardiomegaly. Three-vessel coronary atherosclerosis. 7. Cholelithiasis. 8. Aortic Atherosclerosis (ICD10-I70.0). These results were called by telephone at the time of interpretation on 06/22/2020 at 11:20 am to provider Lavonia Drafts, who verbally acknowledged these results. Electronically Signed   By: Ilona Sorrel M.D.   On: 06/22/2020 11:23   DG Chest Port 1 View  Result Date: 06/22/2020 CLINICAL DATA:  69 year old female with history of weakness for the past 3 weeks and dizziness for the past 24 hours. EXAM: PORTABLE CHEST 1 VIEW COMPARISON:  Chest x-ray 10/24/2012. FINDINGS: Lung volumes are low. No consolidative airspace disease. No pleural effusions. No pneumothorax. No pulmonary nodule or mass noted. Pulmonary vasculature and the cardiomediastinal silhouette are within normal limits. Atherosclerosis in the thoracic aorta. Status post bilateral shoulder arthroplasty. IMPRESSION: 1. Low lung volumes  without radiographic evidence of acute cardiopulmonary disease. 2. Aortic atherosclerosis. Electronically Signed   By: Vinnie Langton M.D.   On: 06/22/2020 09:09    EKG: Independently reviewed.  Sinus rhythm and old inferior infarct  Assessment/Plan Principal Problem:   AKI (acute kidney injury) ([place]) Active Problems:   Depression   Essential hypertension   Generalized anxiety disorder   CLL (chronic lymphocytic leukemia) (HCC)   CKD (chronic kidney disease) stage 3, GFR 30-59 ml/min   Fall at home, initial encounter    Acute on chronic kidney injury Patient at baseline has a history of chronic  kidney disease stage III secondary to diabetes mellitus She is noted to have worsening of her renal function from baseline 1.67 >> 4.63 Worsening renal function appears to be secondary to ATN from hypotension related to GI loss from diarrhea as well as concomitant diuretic use Hold ACE inhibitors and diuretics Aggressive IV fluid resuscitation We will request nephrology consult    Status post fall Patient presents to the ER for evaluation of weakness and dizziness and was found to be hypotensive in the field with systolic blood pressure of 48mHg Her symptoms of dizziness and weakness appeared to be related to hypotension Patient noted to have multiple rib fractures We will place patient on fall precautions Pain control and incentive spirometry We will request PT evaluation once patient is stable Obtain CK levels  Diabetes mellitus With complications of chronic kidney disease stage III Maintain consistent carbohydrate diet Glycemic control with insulin   Depression and Anxiety Continue Wellbutrin and alprazolam   Hypertension Hold all antihypertensive medications for now   ??  Acute appendicitis Noted on CT scan of the abdomen and pelvis Patient denies having any abdominal pain Patient has been seen in consultation by surgery who thinks this is related to her lymphoma and  has recommended oncology consult We will consult oncology We will place patient empirically on antibiotic therapy with Rocephin for presumed acute appendicitis   History of CLL Imaging suggestive of worsening disease Patient follows up with oncology We will request oncology consult during this hospitalization DVT prophylaxis: Heparin Code Status: Full code Family Communication: Greater than 50% discussing plan of care with patient at the bedside.  All questions and concerns have been addressed.  CODE STATUS was discussed with patient and she wants to be placed on a DO NOT RESUSCITATE status.  She names her friend MFlavia Shipperas her healthcare power of attorney Disposition Plan: Back to previous home environment Consults called: Oncology/Nephrology/Surgery     TCollier BullockMD [facility]     06/22/2020, 1:56 PM

## 2020-06-22 NOTE — ED Notes (Signed)
MD Agbata notified of BGL 42- will give 25 ml's D50%. Orders to follow.

## 2020-06-22 NOTE — ED Notes (Signed)
PT states she cannot urinate using the purewick, pt assisted to toilet. Pt urinated on way to the toilet. Linens changed. Pt instructed to use call-bell when she is done urinating. Pt verbalizes understanding.

## 2020-06-22 NOTE — ED Provider Notes (Signed)
[facility] Emergency Department Provider Note   ____________________________________________    I have reviewed the triage vital signs and the nursing notes.   HISTORY  Chief Complaint Dizziness and Weakness     HPI Brenda [name] is a 69 y.o. female with a history of CLL, diabetes, renal insufficiency who presents after a fall.  Patient reports that approximately 3 AM she was bending over to pick up a TV remote, when she stood up she felt quite dizzy fell over.  She reports he did not hit her head although does report some nose soreness.  Her primary complaint is pain in her left lower ribs and left upper abdomen.  She reports her creatinine is typically 1.6.  Denies fevers or chills.  Has had Covid vaccines.   Past Medical History:  Diagnosis Date  . Anxiety   . Arthritis   . CLL (chronic lymphocytic leukemia) (HCC)    Dx. 2019 Dr. Irene Limbo'  . Depression    Wellbutrin  . Diabetes mellitus    Type II  . Endometrial ca ([place])    1998  . 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 GERD (gastroesophageal reflux disease)   . Heart murmur    patient states "cardiologist has not heard heart murmur for past several years"  . Heel spur   . History of hiatal hernia    small  . 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 Hyperkalemia   . Hyperlipidemia   . Hypertension   . Left leg swelling    "swelling in left leg from knee down when sitting for prolonged periods or being on feet for a long period of time"  . PONV (postoperative nausea and vomiting)    after hysterectomy  . Renal insufficiency    Stage 3 b   Dr. Particia Nearing first visit 11-2019    Patient Active Problem List   Diagnosis Date Noted  . Osteopenia 05/22/2020  . Status post total shoulder arthroplasty, left 01/10/2020  . Well woman exam without gynecological exam  05/02/2019  . SCC (squamous cell carcinoma), hand, right 05/02/2019  . Advice given about COVID-19 virus infection 05/02/2019  . CKD (chronic kidney disease) stage 3, GFR 30-59 ml/min 07/05/2018  . CLL (chronic lymphocytic leukemia) ([place]) 03/13/2018  . Anxiety 03/13/2018  . Bilateral edema of lower extremity 01/18/2018  . Pleural effusion, bilateral 12/19/2017  . Acute on chronic renal failure ([place]) 11/28/2017  . Hypokalemia 11/28/2017  . History of endometrial cancer 11/18/2017  . Pelvic lymphadenopathy 11/18/2017  . Microcytic anemia 11/18/2017  . Daytime sleepiness 08/22/2017  . Medication side effect, initial encounter 08/22/2017  . Coronary artery disease of native artery of native heart with stable angina pectoris ([place]) 04/03/2017  . S/P shoulder replacement, right 08/19/2016  . Type 2 diabetes mellitus with hyperglycemia, without long-term current use of insulin ([place]) 10/15/2015  . Obesity 01/24/2015  . Generalized anxiety disorder 01/02/2015  . Chronic radicular low back pain 04/01/2014  . Tachycardia 01/31/2013  . Hip fracture, left ([place]) 10/24/2012  . Postop Hyponatremia 12/21/2011  . OA (osteoarthritis) 09/20/2011  . Mixed hyperlipidemia 11/06/2010  . Depression 11/06/2010  . Essential hypertension 11/06/2010  . GERD 11/06/2010    Past Surgical History:  Procedure Laterality Date  . ABDOMINAL HYSTERECTOMY  1998  . COLONOSCOPY    . EYE SURGERY Bilateral  cataracts  . EYE SURGERY Left    retina tear  . FEMUR IM NAIL  10/25/2012   Procedure: INTRAMEDULLARY (IM) NAIL FEMORAL;  Surgeon: Mauri Pole, MD;  Location: [facility];  Service: Orthopedics;  Laterality: Left;  . HIP SURGERY     Fracture L IM nail and 3 rods  . left knee arthroscopy  12/2010  . LYMPH NODE BIOPSY     axillary left 12-2018  . REFRACTIVE SURGERY    . skin cancer removed right and and lower forearm     squamoun in situ  . TOTAL KNEE ARTHROPLASTY  12/20/2011   Procedure: TOTAL KNEE  ARTHROPLASTY;  Surgeon: Gearlean Alf, MD;  Location: [facility];  Service: Orthopedics;  Laterality: Left;  Failed attempt at spinal   . TOTAL SHOULDER ARTHROPLASTY Right 08/19/2016   Procedure: RIGHT TOTAL SHOULDER ARTHROPLASTY;  Surgeon: Justice Britain, MD;  Location: [place];  Service: Orthopedics;  Laterality: Right;  . TOTAL SHOULDER ARTHROPLASTY Left 01/10/2020   Procedure: TOTAL SHOULDER ARTHROPLASTY;  Surgeon: Justice Britain, MD;  Location: [facility];  Service: Orthopedics;  Laterality: Left;  137mn    Prior to Admission medications   Medication Sig Start Date End Date Taking? Authorizing Provider  acetaminophen (TYLENOL) 500 MG tablet Take 500 mg by mouth every 6 (six) hours as needed.    [provider]  ALPRAZolam (Duanne Moron 1 MG tablet Take 0.5-1 tablets (0.5-1 mg total) by mouth 2 (two) times daily as needed. for anxiety 06/11/20   CRonnald Nian DO  aspirin 81 MG tablet Take 81 mg by mouth every other day.     [provider]  benazepril-hydrochlorthiazide (LOTENSIN HCT) 20-12.5 MG tablet Take 1 tablet by mouth daily. 05/15/20   Cirigliano, MGarvin Fila DO  Blood Glucose Monitoring Suppl (ONETOUCH VERIO) w/Device KIT Use to check blood sugar 3 times a day 08/06/19   GPhilemon Kingdom MD  buPROPion (WELLBUTRIN XL) 300 MG 24 hr tablet Take 1 tablet (300 mg total) by mouth daily. 12/10/19   ALucille Passy MD  Cholecalciferol (VITAMIN D3) 2000 units TABS Take 2,000 mcg by mouth daily.    [provider]  Cyanocobalamin (B-12) 1000 MCG SUBL Place 1,000 mcg under the tongue every evening.  12/15/17   [provider]  cyclobenzaprine (FLEXERIL) 10 MG tablet Take 1 tablet (10 mg total) by mouth 3 (three) times daily as needed for muscle spasms. Patient not taking: Reported on 05/12/2020 01/11/20   Shuford, TOlivia Mackie PA-C  doxazosin (CARDURA) 2 MG tablet TAKE 1 TABLET(2 MG) BY MOUTH DAILY 05/26/20   GMinna Merritts MD  esomeprazole (NEXIUM) 20 MG capsule Take 20 mg by mouth  daily at 12 noon.    [provider]  ezetimibe (ZETIA) 10 MG tablet Take 1 tablet (10 mg total) by mouth daily. Patient taking differently: Take 100 mg by mouth every evening.  11/26/19   GMinna Merritts MD  gabapentin (NEURONTIN) 300 MG capsule Take 1 capsule (300 mg total) by mouth at bedtime. 12/04/19   ALucille Passy MD  glipiZIDE (GLUCOTROL XL) 2.5 MG 24 hr tablet TAKE 1 TABLET BY MOUTH BEFORE BREAKFAST and 1-2 tablets before dinner 05/16/20   GPhilemon Kingdom MD  glipiZIDE (GLUCOTROL) 5 MG tablet TAKE 1 TABLET(5 MG) BY MOUTH DAILY BEFORE SUPPER 02/25/20   GPhilemon Kingdom MD  glucose blood (ONETOUCH VERIO) test strip Use to check blood sugar 3 times a day. 08/06/19   GPhilemon Kingdom MD  insulin glargine (LANTUS SOLOSTAR)  100 UNIT/ML Solostar Pen Inject 22-24 Units into the skin at bedtime. 03/05/20   Philemon Kingdom, MD  Insulin Pen Needle 32G X 4 MM MISC Use 1x a day 07/02/19   Philemon Kingdom, MD  iron polysaccharides (NIFEREX) 150 MG capsule Take 150 mg by mouth at bedtime.     [provider]  Lancet Devices (LANCING DEVICE) MISC Use as advised - Freestyle 05/29/19   Philemon Kingdom, MD  metoprolol tartrate (LOPRESSOR) 25 MG tablet TAKE 1 TABLET BY MOUTH ONCE DAILY 05/26/20   Minna Merritts, MD  NONFORMULARY OR COMPOUNDED ITEM Apply 1 application topically 2 (two) times daily as needed (Sun damage on face). FLUOROURACIL 5% + CALCIPOTRIENE 0.005% 08/15/19   [provider]  OneTouch Delica Lancets 09O MISC 1 each by Does not apply route 3 (three) times daily. To check blood sugar 3 times a day 11/07/19   Philemon Kingdom, MD  simvastatin (ZOCOR) 40 MG tablet Take 1 tablet (40 mg total) by mouth at bedtime. 04/04/19   Minna Merritts, MD  triamcinolone cream (KENALOG) 0.1 % Apply 1 application topically 2 (two) times daily as needed (skin rash.).  08/14/19   [provider]     Allergies Amlodipine and Nsaids  Family History  Problem Relation  Age of Onset  . Cancer Mother        breast cancer  . Cancer Father        plasma sarcoma  . Breast cancer Neg Hx     Social History Social History   Tobacco Use  . Smoking status: Never Smoker  . Smokeless tobacco: Never Used  Vaping Use  . Vaping Use: Never used  Substance Use Topics  . Alcohol use: No  . Drug use: No    Review of Systems  Constitutional: No fever/chills Eyes: No visual changes.  ENT: No sore throat. Cardiovascular: As above Respiratory: Denies shortness of breath. Gastrointestinal: As above Genitourinary: Negative for dysuria. Musculoskeletal: Negative for back pain. Skin: Negative for rash. Neurological: Negative for headaches   ____________________________________________   PHYSICAL EXAM:  VITAL SIGNS: ED Triage Vitals  Enc Vitals Group     BP 06/22/20 0833 (!) 95/56     Pulse Rate 06/22/20 0833 89     Resp 06/22/20 0833 19     Temp 06/22/20 0833 98 F (36.7 C)     Temp Source 06/22/20 0833 Oral     SpO2 06/22/20 0833 96 %     Weight 06/22/20 0834 83.5 kg (184 lb)     Height 06/22/20 0834 1.499 m (_0 )     Head Circumference --      Peak Flow --      Pain Score 06/22/20 0833 10     Pain Loc --      Pain Edu? --      Excl. in [place]? --     Constitutional: Alert and oriented.  Head: Atraumatic. Nose: No swelling or epistaxis Mouth/Throat: Mucous membranes are moist.   Neck:  Painless ROM no vertebral tenderness palpation, no pain with axial load Cardiovascular: Normal rate, regular rhythm. Grossly normal heart sounds.  Good peripheral circulation.  Tenderness palpation along the left lower rib cage Respiratory: Normal respiratory effort.  No retractions. Lungs CTAB. Gastrointestinal: Soft, mild tenderness left upper quadrant, no distention, no right lower quadrant pain, no CVA tenderness  Musculoskeletal: No lower extremity tenderness nor edema.  Warm and well perfused Neurologic:  Normal speech and language. No gross focal  neurologic  deficits are appreciated.  Skin:  Skin is warm, dry and intact. No rash noted. Psychiatric: Mood and affect are normal. Speech and behavior are normal.  ____________________________________________   LABS (all labs ordered are listed, but only abnormal results are displayed)  Labs Reviewed  CBC - Abnormal; Notable for the following components:      Result Value   WBC 15.4 (*)    RBC 3.80 (*)    Hemoglobin 10.9 (*)    HCT 33.8 (*)    All other components within normal limits  COMPREHENSIVE METABOLIC PANEL - Abnormal; Notable for the following components:   Sodium 132 (*)    CO2 19 (*)    BUN 77 (*)    Creatinine, Ser 4.63 (*)    Calcium 7.8 (*)    Total Protein 5.8 (*)    Albumin 3.4 (*)    GFR calc non Af Amer 9 (*)    GFR calc Af Amer 10 (*)    All other components within normal limits  SARS CORONAVIRUS 2 BY RT PCR ([facility])  URINALYSIS, COMPLETE (UACMP) WITH MICROSCOPIC  TROPONIN I (HIGH SENSITIVITY)   ____________________________________________  EKG  ED ECG REPORT I, Lavonia Drafts, the attending physician, personally viewed and interpreted this ECG.  Date: 06/22/2020  Rhythm: normal sinus rhythm QRS Axis: normal Intervals: normal ST/T Wave abnormalities: normal Narrative Interpretation: no evidence of acute ischemia  ____________________________________________  RADIOLOGY  Chest x-ray reviewed by me, no infiltrate effusion or pneumothorax Contacted by radiologist and notified of possible appendicitis, worsening CLL, rib fractures ____________________________________________   PROCEDURES  Procedure(s) performed: No  Procedures   Critical Care performed: No ____________________________________________   INITIAL IMPRESSION / ASSESSMENT AND PLAN / ED COURSE  Pertinent labs & imaging results that were available during my care of the patient were reviewed by me and considered in my medical  decision making (see chart for details).  Patient presents after a fall as described above, occurred immediately after sudden change in position suspicious for orthostasis.  Differential includes orthostatic hypotension, dehydration, infection, electrolyte abnormalities.  We will treat with IV fluids, IV morphine, IV Zofran while we await labs.  Lab work is notable for elevated creatinine of 4.63, this is up from her baseline of 1.6  Troponin is 15,  EKG is reassuring  White blood cell count is 15.4, likely related to injury  CT of the chest abdomen and pelvis ordered,  Contacted by radiologist and notified of abnormal findings on CT abdomen pelvis which demonstrates worsening lymphoma, possible appendicitis as well as rib fractures.  Patient has no tenderness to palpation of the right lower quadrant, consulted Dr. Dahlia Byes of surgery who will see the patient  Have consulted the hospitalist service for admission    ____________________________________________   FINAL CLINICAL IMPRESSION(S) / ED DIAGNOSES  Final diagnoses:  Closed fracture of multiple ribs of left side, initial encounter  Acute renal failure superimposed on chronic kidney disease, unspecified CKD stage, unspecified acute renal failure type ([place])  Appendicitis, unspecified appendicitis type        Note:  This document was prepared using Dragon voice recognition software and may include unintentional dictation errors.   Lavonia Drafts, MD 06/22/20 1149

## 2020-06-22 NOTE — ED Notes (Signed)
Dr Francine Graven notified of pt BP 90 SBP with MAP less than 65 after receiving morphine. 1L bolus NS ordered

## 2020-06-23 DIAGNOSIS — N189 Chronic kidney disease, unspecified: Secondary | ICD-10-CM

## 2020-06-23 DIAGNOSIS — Y92009 Unspecified place in unspecified non-institutional (private) residence as the place of occurrence of the external cause: Secondary | ICD-10-CM

## 2020-06-23 DIAGNOSIS — S2242XD Multiple fractures of ribs, left side, subsequent encounter for fracture with routine healing: Secondary | ICD-10-CM

## 2020-06-23 DIAGNOSIS — N179 Acute kidney failure, unspecified: Principal | ICD-10-CM

## 2020-06-23 DIAGNOSIS — D649 Anemia, unspecified: Secondary | ICD-10-CM

## 2020-06-23 DIAGNOSIS — F418 Other specified anxiety disorders: Secondary | ICD-10-CM

## 2020-06-23 DIAGNOSIS — E785 Hyperlipidemia, unspecified: Secondary | ICD-10-CM

## 2020-06-23 DIAGNOSIS — K37 Unspecified appendicitis: Secondary | ICD-10-CM

## 2020-06-23 DIAGNOSIS — W19XXXA Unspecified fall, initial encounter: Secondary | ICD-10-CM

## 2020-06-23 DIAGNOSIS — J9601 Acute respiratory failure with hypoxia: Secondary | ICD-10-CM | POA: Insufficient documentation

## 2020-06-23 DIAGNOSIS — S2242XA Multiple fractures of ribs, left side, initial encounter for closed fracture: Secondary | ICD-10-CM | POA: Insufficient documentation

## 2020-06-23 DIAGNOSIS — I959 Hypotension, unspecified: Secondary | ICD-10-CM

## 2020-06-23 DIAGNOSIS — C9110 Chronic lymphocytic leukemia of B-cell type not having achieved remission: Secondary | ICD-10-CM

## 2020-06-23 LAB — GLUCOSE, CAPILLARY
Glucose-Capillary: 32 mg/dL — CL (ref 70–99)
Glucose-Capillary: 133 mg/dL — ABNORMAL HIGH (ref 70–99)
Glucose-Capillary: 128 mg/dL — ABNORMAL HIGH (ref 70–99)
Glucose-Capillary: 80 mg/dL (ref 70–99)
Glucose-Capillary: 132 mg/dL — ABNORMAL HIGH (ref 70–99)

## 2020-06-23 LAB — COMPREHENSIVE METABOLIC PANEL
ALT: 12 U/L (ref 0–44)
AST: 24 U/L (ref 15–41)
Albumin: 3 g/dL — ABNORMAL LOW (ref 3.5–5.0)
Alkaline Phosphatase: 67 U/L (ref 38–126)
Anion gap: 13 (ref 5–15)
BUN: 67 mg/dL — ABNORMAL HIGH (ref 8–23)
CO2: 15 mmol/L — ABNORMAL LOW (ref 22–32)
Calcium: 7.2 mg/dL — ABNORMAL LOW (ref 8.9–10.3)
Chloride: 105 mmol/L (ref 98–111)
Creatinine, Ser: 3.6 mg/dL — ABNORMAL HIGH (ref 0.44–1.00)
GFR calc Af Amer: 14 mL/min — ABNORMAL LOW (ref >60)
GFR calc non Af Amer: 12 mL/min — ABNORMAL LOW (ref >60)
Glucose, Bld: 202 mg/dL — ABNORMAL HIGH (ref 70–99)
Potassium: 3.6 mmol/L (ref 3.5–5.1)
Sodium: 133 mmol/L — ABNORMAL LOW (ref 135–145)
Total Bilirubin: 0.9 mg/dL (ref 0.3–1.2)
Total Protein: 5.2 g/dL — ABNORMAL LOW (ref 6.5–8.1)

## 2020-06-23 LAB — URINALYSIS, COMPLETE (UACMP) WITH MICROSCOPIC
Bilirubin Urine: NEGATIVE
Glucose, UA: NEGATIVE mg/dL
Ketones, ur: NEGATIVE mg/dL
Leukocytes,Ua: NEGATIVE
Nitrite: NEGATIVE
Protein, ur: NEGATIVE mg/dL
Specific Gravity, Urine: 1.014 (ref 1.005–1.030)
Squamous Epithelial / HPF: NONE SEEN (ref 0–5)
pH: 5 (ref 5.0–8.0)

## 2020-06-23 LAB — BASIC METABOLIC PANEL
Anion gap: 8 (ref 5–15)
BUN: 56 mg/dL — ABNORMAL HIGH (ref 8–23)
CO2: 22 mmol/L (ref 22–32)
Calcium: 7.4 mg/dL — ABNORMAL LOW (ref 8.9–10.3)
Chloride: 108 mmol/L (ref 98–111)
Creatinine, Ser: 2.7 mg/dL — ABNORMAL HIGH (ref 0.44–1.00)
GFR calc Af Amer: 20 mL/min — ABNORMAL LOW (ref >60)
GFR calc non Af Amer: 17 mL/min — ABNORMAL LOW (ref >60)
Glucose, Bld: 152 mg/dL — ABNORMAL HIGH (ref 70–99)
Potassium: 4.5 mmol/L (ref 3.5–5.1)
Sodium: 138 mmol/L (ref 135–145)

## 2020-06-23 LAB — LACTATE DEHYDROGENASE: LDH: 154 U/L (ref 98–192)

## 2020-06-23 LAB — APTT: aPTT: 32 seconds (ref 24–36)

## 2020-06-23 LAB — CBC
HCT: 30.8 % — ABNORMAL LOW (ref 36.0–46.0)
Hemoglobin: 9.8 g/dL — ABNORMAL LOW (ref 12.0–15.0)
MCH: 28.9 pg (ref 26.0–34.0)
MCHC: 31.8 g/dL (ref 30.0–36.0)
MCV: 90.9 fL (ref 80.0–100.0)
Platelets: 147 10*3/uL — ABNORMAL LOW (ref 150–400)
RBC: 3.39 MIL/uL — ABNORMAL LOW (ref 3.87–5.11)
RDW: 13.4 % (ref 11.5–15.5)
WBC: 15.4 10*3/uL — ABNORMAL HIGH (ref 4.0–10.5)
nRBC: 0 % (ref 0.0–0.2)

## 2020-06-23 LAB — PATHOLOGIST SMEAR REVIEW

## 2020-06-23 LAB — PROTIME-INR
INR: 1.2 (ref 0.8–1.2)
Prothrombin Time: 14.7 seconds (ref 11.4–15.2)

## 2020-06-23 MED ORDER — METRONIDAZOLE IN NACL 5-0.79 MG/ML-% IV SOLN: 500.0000 mg | Freq: Three times a day (TID) | INTRAVENOUS | Status: DC

## 2020-06-23 MED ORDER — MIDODRINE HCL 5 MG PO TABS
5.0000 mg | ORAL_TABLET | Freq: Three times a day (TID) | ORAL | Status: DC
  Administered 2020-06-23 – 2020-06-24 (×5): 5 mg via ORAL
  Filled 2020-06-23 (×8): qty 1

## 2020-06-23 MED ORDER — LACTATED RINGERS IV BOLUS (SEPSIS)
1000.0000 mL | Freq: Once | INTRAVENOUS | Status: AC
  Administered 2020-06-23: 1000 mL via INTRAVENOUS

## 2020-06-23 MED ORDER — OXYCODONE HCL 5 MG PO TABS
5.0000 mg | ORAL_TABLET | ORAL | Status: DC | PRN
  Administered 2020-06-23 – 2020-06-26 (×5): 5 mg via ORAL
  Filled 2020-06-23 (×5): qty 1

## 2020-06-23 MED ORDER — SODIUM CHLORIDE 0.9 % IV SOLN: 1000.0000 mL | Freq: Once | INTRAVENOUS | Status: DC

## 2020-06-23 MED ORDER — LACTATED RINGERS IV SOLN: INTRAVENOUS | Status: DC

## 2020-06-23 MED ORDER — LACTATED RINGERS IV BOLUS
500.0000 mL | Freq: Once | INTRAVENOUS | Status: AC
  Administered 2020-06-23: 500 mL via INTRAVENOUS

## 2020-06-23 MED ORDER — METHOCARBAMOL 500 MG PO TABS
500.0000 mg | ORAL_TABLET | Freq: Three times a day (TID) | ORAL | Status: DC | PRN
  Filled 2020-06-23: qty 1

## 2020-06-23 NOTE — ED Notes (Addendum)
Notified NP of BP again

## 2020-06-23 NOTE — Progress Notes (Signed)
CODE SEPSIS - PHARMACY COMMUNICATION  **Broad Spectrum Antibiotics should be administered within 1 hour of Sepsis diagnosis**  Time Code Sepsis Called/Page Received: 3329  Antibiotics Ordered: Rocephin  Time of 1st antibiotic administration: 1537 on 8/22 (already given)  Additional action taken by pharmacy: n/a  If necessary, Name of Provider/Nurse Contacted: n/a    Ena Dawley ,PharmD Clinical Pharmacist  06/23/2020  1:13 AM

## 2020-06-23 NOTE — Progress Notes (Addendum)
PROGRESS NOTE    Brenda [name]  PJA:[id number] DOB: Aug 25, 1951 DOA: 06/22/2020 PCP: Ronnald Nian, DO  Brief Narrative:  Patient is a 69 year old female with a known history of CLL, hypertension, diabetes mellitus with complications of chronic kidney disease admitted on 06/22/2020 following a week of diarrhea, culminating in presyncopal episode resulting in a fall onto the left side sustaining 2 left rib fractures. Work-up revealed hypotension, acute kidney injury as well as incidental finding of acute appendicitis on CT, though not clinically concerning on evaluation by surgery.  She was nonetheless started on empiric antibiotics.  During the course of the evening patient had episodes of hypoglycemia and was noted to be persistently hypotensive with MAP is in the 40s and 50s, as low as the 30s in spite of continuous IV hydration.  Patient was evaluated, IV fluid boluses repeated, patient reevaluated, labs ordered, results and vitals reviewed, response to treatment reviewed and finally discussion had with critical care team and recommendations carried out.  Assessment & Plan:  Possible Hypovolemic versus septic shock  -Hypovolemic shock related to recent diarrhea versus septic shock from acute gastroenteritis vs questionable appendicitis as seen on CT -Probable insignificant contribution from pain meds for management of acute rib fracture pain towards hypotension -Patient started on sepsis fluid bolus at 30 ml/kg -Continuing IV Rocephin and vancomycin started on initial presentation -Repeat lactic acid reassuring at 0.9.  WBC trending down 15.4>>13.7 -[facility] sepsis team on board -Discussed with critical care team, NP Maggie at 1:00am -Recommend getting IV team to put midline to intensify fluid resuscitation as patient has only received 3 L IV fluids since arrival 12 hours prior -If patient not fluid responsive, plan for pressors and central line placement and ICU transfer -may Consider  surgical reevaluation regarding findings on CT scan -Continue to monitor closely    AKI (acute kidney injury) superimposed on CKD stage IIIb (HCC) -Creatinine has worsened 3.05>>3.60 suggesting inadequate fluid resuscitation -Management as above and continue to monitor  Abnormal CT abdomen -Surgical evaluation appreciated, clinically not appendicitis -Patient denied abdominal pain and vomiting or fever -Continue to monitor -Continue IV Rocephin and Flagyl  Multiple rib fractures Fall at home -Judicious opiate use in view of protracted hypotension    Depression   Essential hypertension   Generalized anxiety disorder   CLL (chronic lymphocytic leukemia) (HCC)   CKD (chronic kidney disease) stage 3, GFR 30-59 ml/min   Fall at home, initial encounter   CRITICAL CARE Performed by: Athena Masse   Total critical care time: 120 minutes  Critical care time was exclusive of separately billable procedures and treating other patients.  Critical care was necessary to treat or prevent imminent or life-threatening deterioration.  Critical care was time spent personally by me on the following activities: development of treatment plan with patient and/or surrogate as well as nursing, discussions with consultants, evaluation of patient's response to treatment, examination of patient, obtaining history from patient or surrogate, ordering and performing treatments and interventions, ordering and review of laboratory studies, ordering and review of radiographic studies, pulse oximetry and re-evaluation of patient's condition.   Objective: Vitals:   06/22/20 2330 06/23/20 0000 06/23/20 0005 06/23/20 0030  BP: (!) 81/37 (!) 67/27 (!) 73/43 (!) 83/32  Pulse: (!) 112 (!) 111 (!) 110 (!) 110  Resp: (!) 29 (!) 21 (!) 26 (!) 25  Temp:      TempSrc:      SpO2: 96% 96% 96% 98%  Weight:  Height:        Intake/Output Summary (Last 24 hours) at 06/23/2020 0111 Last data filed at 06/22/2020  1936 Gross per 24 hour  Intake 3130 ml  Output --  Net 3130 ml   Filed Weights   06/22/20 0834  Weight: 83.5 kg    Examination:  General exam: Appears calm and comfortable  Respiratory system: Clear to auscultation. Respiratory effort normal. Cardiovascular system: S1 & S2 heard, RRR. No JVD, murmurs, rubs, gallops or clicks. No pedal edema. Gastrointestinal system: Abdomen is nondistended, soft and nontender. No organomegaly or masses felt. Normal bowel sounds heard. Central nervous system: Alert and oriented. No focal neurological deficits. Extremities: Symmetric 5 x 5 power. Skin: No rashes, lesions or ulcers Psychiatry: Judgement and insight appear normal. Mood & affect appropriate.     Data Reviewed: I have personally reviewed following labs and imaging studies  CBC: Recent Labs  Lab 06/22/20 0845 06/22/20 2330  WBC 15.4* 13.7*  HGB 10.9* 9.7*  HCT 33.8* 28.8*  MCV 88.9 87.0  PLT 160 741   Basic Metabolic Panel: Recent Labs  Lab 06/22/20 0845 06/22/20 1622 06/22/20 2330  NA 132* 135 133*  K 3.8 3.1* 3.6  CL 98 107 105  CO2 19* 17* 15*  GLUCOSE 90 41* 202*  BUN 77* 62* 67*  CREATININE 4.63* 3.05* 3.60*  CALCIUM 7.8* 6.2* 7.2*   GFR: Estimated Creatinine Clearance: 13.4 mL/min (A) (by C-G formula based on SCr of 3.6 mg/dL (H)). Liver Function Tests: Recent Labs  Lab 06/22/20 0845 06/22/20 2330  AST 15 24  ALT 12 12  ALKPHOS 71 67  BILITOT 0.9 0.9  PROT 5.8* 5.2*  ALBUMIN 3.4* 3.0*   No results for input(s): LIPASE, AMYLASE in the last 168 hours. No results for input(s): AMMONIA in the last 168 hours. Coagulation Profile: No results for input(s): INR, PROTIME in the last 168 hours. Cardiac Enzymes: Recent Labs  Lab 06/22/20 0845  CKTOTAL 209   BNP (last 3 results) No results for input(s): PROBNP in the last 8760 hours. HbA1C: No results for input(s): HGBA1C in the last 72 hours. CBG: Recent Labs  Lab 06/22/20 1627 06/22/20 1649  06/22/20 1938 06/22/20 2208 06/22/20 2327  GLUCAP 42* 132* 155* 152* 191*   Lipid Profile: No results for input(s): CHOL, HDL, LDLCALC, TRIG, CHOLHDL, LDLDIRECT in the last 72 hours. Thyroid Function Tests: No results for input(s): TSH, T4TOTAL, FREET4, T3FREE, THYROIDAB in the last 72 hours. Anemia Panel: No results for input(s): VITAMINB12, FOLATE, FERRITIN, TIBC, IRON, RETICCTPCT in the last 72 hours. Sepsis Labs: Recent Labs  Lab 06/22/20 2330  LATICACIDVEN 0.9    Recent Results (from the past 240 hour(s))  SARS Coronavirus 2 by RT PCR (hospital order, performed in [facility] hospital lab) Nasopharyngeal Nasopharyngeal Swab     Status: None   Collection Time: 06/22/20 11:44 AM   Specimen: Nasopharyngeal Swab  Result Value Ref Range Status   SARS Coronavirus 2 NEGATIVE NEGATIVE Final    Comment: (NOTE) SARS-CoV-2 target nucleic acids are NOT DETECTED.  The SARS-CoV-2 RNA is generally detectable in upper and lower respiratory specimens during the acute phase of infection. The lowest concentration of SARS-CoV-2 viral copies this assay can detect is 250 copies / mL. A negative result does not preclude SARS-CoV-2 infection and should not be used as the sole basis for treatment or other patient management decisions.  A negative result may occur with improper specimen collection / handling, submission of specimen other than nasopharyngeal  swab, presence of viral mutation(s) within the areas targeted by this assay, and inadequate number of viral copies (<250 copies / mL). A negative result must be combined with clinical observations, patient history, and epidemiological information.  Fact Sheet for Patients:   StrictlyIdeas.no  Fact Sheet for Healthcare Providers: BankingDealers.co.za  This test is not yet approved or  cleared by the [place] FDA and has been authorized for detection and/or diagnosis of SARS-CoV-2 by FDA  under an Emergency Use Authorization (EUA).  This EUA will remain in effect (meaning this test can be used) for the duration of the COVID-19 declaration under Section 564(b)(1) of the Act, 21 U.S.C. section 360bbb-3(b)(1), unless the authorization is terminated or revoked sooner.  Performed at [facility], [address] [place], [place] [address]          Radiology Studies: CT ABDOMEN PELVIS WO CONTRAST  Result Date: 06/22/2020 CLINICAL DATA:  Fall with left flank and rib injury with bruising. Weakness and dizziness. EXAM: CT CHEST, ABDOMEN AND PELVIS WITHOUT CONTRAST TECHNIQUE: Multidetector CT imaging of the chest, abdomen and pelvis was performed following the standard protocol without IV contrast. COMPARISON:  Chest radiograph from earlier today. History of lymphoma. FINDINGS: CT CHEST FINDINGS Cardiovascular: Stable mild cardiomegaly. Stable trace pericardial effusion/thickening. Three-vessel coronary atherosclerosis. Atherosclerotic nonaneurysmal thoracic aorta. No acute intramural hematoma in the thoracic aorta. Normal caliber pulmonary arteries. Mediastinum/Nodes: No pneumomediastinum. No mediastinal hematoma. No discrete thyroid nodules. Unremarkable esophagus. Moderate bilateral axillary/retropectoral lymphadenopathy, increased. Representative 1.9 cm left axillary node (series 2/image 10), increased from 1.2 cm on 05/01/2018 CT. Representative 1.9 cm right axillary node (series 2/image 18), increased from 1.5 cm. Enlarged 1.9 cm subcarinal node (series 2/image 25), increased from 1.5 cm. New mild bilateral paratracheal adenopathy up to 1.1 cm on the left (series 2/image 16). No discrete hilar adenopathy on these noncontrast images. Lungs/Pleura: No pneumothorax. No pleural effusion. No acute consolidative airspace disease or lung masses. Mild platelike atelectasis at the dependent lung bases bilaterally. Numerous (greater than 20) solid pulmonary nodules scattered throughout  both lungs, mildly increased. Representative 0.9 cm medial right middle lobe nodule (series 4/image 73), increased from 0.7 cm. Representative 0.7 cm anterior right lower lobe nodule (series 4/image 65), increased from 0.6 cm. Representative 0.4 cm superior segment left lower lobe nodule (series 4/image 55), previously 0.3 cm. Musculoskeletal: No aggressive appearing focal osseous lesions. Acute nondisplaced anterior left fifth, sixth, seventh and eighth rib fractures. Marked thoracic spondylosis. Bilateral shoulder arthroplasties, partially visualized. CT ABDOMEN PELVIS FINDINGS Hepatobiliary: Normal liver with no liver mass. Cholelithiasis. No biliary ductal dilatation. Pancreas: Normal, with no laceration, mass or duct dilation. Spleen: Normal size spleen.  No splenic mass. Adrenals/Urinary Tract: Normal adrenals. No hydronephrosis. No contour deforming renal masses. No renal stones. Normal bladder. Stomach/Bowel: Grossly normal stomach. Normal caliber small bowel with no small bowel wall thickening. Diffusely dilated and thick-walled appendix (14 mm diameter) with prominent periappendiceal fat stranding adjacent to the mid to distal appendix, suggesting acute appendicitis. No appendicolith. No periappendiceal free air or measurable collection. Normal large bowel with no diverticulosis, large bowel wall thickening or pericolonic fat stranding. Vascular/Lymphatic: Atherosclerotic nonaneurysmal abdominal aorta. Porta hepatis, aortocaval, left para-aortic and bulky bilateral iliac lymphadenopathy is increased. Representative 2.1 cm porta hepatis node (series 2/image 50), previously 1.9 cm. Representative 1.7 cm left para-aortic node (series 2/image 74), previously 1.2 cm. Representative 4.0 cm left external iliac node (series 2/image 99), previously 3.3 cm. Reproductive: Status post hysterectomy, with no abnormal findings at  the vaginal cuff. No adnexal mass. Other: No pneumoperitoneum, ascites or focal fluid  collection. Musculoskeletal: No aggressive appearing focal osseous lesions. No fracture in the abdomen or pelvis. Partially visualized fixation hardware in the proximal left femur. Marked lumbar spondylosis. IMPRESSION: 1. Acute nondisplaced anterior left fifth, sixth, seventh and eighth rib fractures. No pneumothorax. 2. No acute traumatic injury in the abdomen or pelvis. 3. CT findings suggestive of acute appendicitis. No free air or measurable abscess on this noncontrast scan. Surgical consultation advised. 4. Widespread lymphadenopathy, increased since 05/01/2018 CT as detailed, compatible with progression of lymphoma. 5. Numerous nonspecific chronic solid pulmonary nodules scattered throughout both lungs, mildly increased, also potentially due to lymphoma. 6. Stable mild cardiomegaly. Three-vessel coronary atherosclerosis. 7. Cholelithiasis. 8. Aortic Atherosclerosis (ICD10-I70.0). These results were called by telephone at the time of interpretation on 06/22/2020 at 11:20 am to provider Lavonia Drafts, who verbally acknowledged these results. Electronically Signed   By: Ilona Sorrel M.D.   On: 06/22/2020 11:23   CT Chest Wo Contrast  Result Date: 06/22/2020 CLINICAL DATA:  Fall with left flank and rib injury with bruising. Weakness and dizziness. EXAM: CT CHEST, ABDOMEN AND PELVIS WITHOUT CONTRAST TECHNIQUE: Multidetector CT imaging of the chest, abdomen and pelvis was performed following the standard protocol without IV contrast. COMPARISON:  Chest radiograph from earlier today. History of lymphoma. FINDINGS: CT CHEST FINDINGS Cardiovascular: Stable mild cardiomegaly. Stable trace pericardial effusion/thickening. Three-vessel coronary atherosclerosis. Atherosclerotic nonaneurysmal thoracic aorta. No acute intramural hematoma in the thoracic aorta. Normal caliber pulmonary arteries. Mediastinum/Nodes: No pneumomediastinum. No mediastinal hematoma. No discrete thyroid nodules. Unremarkable esophagus. Moderate  bilateral axillary/retropectoral lymphadenopathy, increased. Representative 1.9 cm left axillary node (series 2/image 10), increased from 1.2 cm on 05/01/2018 CT. Representative 1.9 cm right axillary node (series 2/image 18), increased from 1.5 cm. Enlarged 1.9 cm subcarinal node (series 2/image 25), increased from 1.5 cm. New mild bilateral paratracheal adenopathy up to 1.1 cm on the left (series 2/image 16). No discrete hilar adenopathy on these noncontrast images. Lungs/Pleura: No pneumothorax. No pleural effusion. No acute consolidative airspace disease or lung masses. Mild platelike atelectasis at the dependent lung bases bilaterally. Numerous (greater than 20) solid pulmonary nodules scattered throughout both lungs, mildly increased. Representative 0.9 cm medial right middle lobe nodule (series 4/image 73), increased from 0.7 cm. Representative 0.7 cm anterior right lower lobe nodule (series 4/image 65), increased from 0.6 cm. Representative 0.4 cm superior segment left lower lobe nodule (series 4/image 55), previously 0.3 cm. Musculoskeletal: No aggressive appearing focal osseous lesions. Acute nondisplaced anterior left fifth, sixth, seventh and eighth rib fractures. Marked thoracic spondylosis. Bilateral shoulder arthroplasties, partially visualized. CT ABDOMEN PELVIS FINDINGS Hepatobiliary: Normal liver with no liver mass. Cholelithiasis. No biliary ductal dilatation. Pancreas: Normal, with no laceration, mass or duct dilation. Spleen: Normal size spleen.  No splenic mass. Adrenals/Urinary Tract: Normal adrenals. No hydronephrosis. No contour deforming renal masses. No renal stones. Normal bladder. Stomach/Bowel: Grossly normal stomach. Normal caliber small bowel with no small bowel wall thickening. Diffusely dilated and thick-walled appendix (14 mm diameter) with prominent periappendiceal fat stranding adjacent to the mid to distal appendix, suggesting acute appendicitis. No appendicolith. No  periappendiceal free air or measurable collection. Normal large bowel with no diverticulosis, large bowel wall thickening or pericolonic fat stranding. Vascular/Lymphatic: Atherosclerotic nonaneurysmal abdominal aorta. Porta hepatis, aortocaval, left para-aortic and bulky bilateral iliac lymphadenopathy is increased. Representative 2.1 cm porta hepatis node (series 2/image 50), previously 1.9 cm. Representative 1.7 cm left para-aortic node (series 2/image 74),  previously 1.2 cm. Representative 4.0 cm left external iliac node (series 2/image 99), previously 3.3 cm. Reproductive: Status post hysterectomy, with no abnormal findings at the vaginal cuff. No adnexal mass. Other: No pneumoperitoneum, ascites or focal fluid collection. Musculoskeletal: No aggressive appearing focal osseous lesions. No fracture in the abdomen or pelvis. Partially visualized fixation hardware in the proximal left femur. Marked lumbar spondylosis. IMPRESSION: 1. Acute nondisplaced anterior left fifth, sixth, seventh and eighth rib fractures. No pneumothorax. 2. No acute traumatic injury in the abdomen or pelvis. 3. CT findings suggestive of acute appendicitis. No free air or measurable abscess on this noncontrast scan. Surgical consultation advised. 4. Widespread lymphadenopathy, increased since 05/01/2018 CT as detailed, compatible with progression of lymphoma. 5. Numerous nonspecific chronic solid pulmonary nodules scattered throughout both lungs, mildly increased, also potentially due to lymphoma. 6. Stable mild cardiomegaly. Three-vessel coronary atherosclerosis. 7. Cholelithiasis. 8. Aortic Atherosclerosis (ICD10-I70.0). These results were called by telephone at the time of interpretation on 06/22/2020 at 11:20 am to provider Lavonia Drafts, who verbally acknowledged these results. Electronically Signed   By: Ilona Sorrel M.D.   On: 06/22/2020 11:23   DG Chest Port 1 View  Result Date: 06/22/2020 CLINICAL DATA:  69 year old female with  history of weakness for the past 3 weeks and dizziness for the past 24 hours. EXAM: PORTABLE CHEST 1 VIEW COMPARISON:  Chest x-ray 10/24/2012. FINDINGS: Lung volumes are low. No consolidative airspace disease. No pleural effusions. No pneumothorax. No pulmonary nodule or mass noted. Pulmonary vasculature and the cardiomediastinal silhouette are within normal limits. Atherosclerosis in the thoracic aorta. Status post bilateral shoulder arthroplasty. IMPRESSION: 1. Low lung volumes without radiographic evidence of acute cardiopulmonary disease. 2. Aortic atherosclerosis. Electronically Signed   By: Vinnie Langton M.D.   On: 06/22/2020 09:09        Scheduled Meds: . aspirin  81 mg Oral QODAY  . buPROPion  300 mg Oral Daily  . heparin  5,000 Units Subcutaneous Q8H  . insulin aspart  0-15 Units Subcutaneous Q4H  . iron polysaccharides  150 mg Oral QHS  . lidocaine  1 patch Transdermal Q24H  . simvastatin  40 mg Oral QHS  . vitamin B-12  1,000 mcg Oral QPM   Continuous Infusions: . cefTRIAXone (ROCEPHIN)  IV Stopped (06/22/20 1543)  . dextrose 5 % and 0.9% NaCl Stopped (06/23/20 0105)  . lactated ringers 1,000 mL (06/23/20 0105)   And  . lactated ringers     And  . lactated ringers    . metronidazole 500 mg (06/23/20 0108)     LOS: 1 day    Time spent: Mize, MD [facility] Pager [phone]  If 7PM-7AM, please contact night-coverage [email or web address] Password [email or web address] 06/23/2020, 1:11 AM

## 2020-06-23 NOTE — Evaluation (Signed)
Physical Therapy Evaluation Patient Details Name: Brenda [name] MRN: [id number] DOB: 1951/07/30 Today's Date: 06/23/2020   History of Present Illness  69 yo Female s/p fall at home with 4 left rib fractures. Patient has a PMH significant for CLL, DM, stage III CKD, HTN; She is being admitted to monitor kidney function.  Clinical Impression  69 yo Female fell at home and sustained left rib fractures. Patient was independent in all ADLs prior to admittance. She lives at home alone and has 4 steps to enter home. She does have intermittent help from neighbors but no one to provide 24/7 care. Patient is currently mod A for bed mobility. She transferred sit<>Stand with min A using RW. Patient ambulated in room x15 feet x2 with RW with min A for safety; Patient does exhibit hypotension with resting BP at 88/50, upon sitting, BP improved to 93/51; Patient insisted on using bathroom. Following ambulation BP increased to 100/43, with HR 111 bpm; Patient denies any dizziness. Concerned about patients limited mobility and high fall risk. Recommend SNF rehab for short duration to improve safety upon discharge. Patient expressed that she does not wish to go to rehab and would prefer to go home. If she insists on going home she will likely need home health PT upon discharge.     Follow Up Recommendations SNF    Equipment Recommendations  None recommended by PT    Recommendations for Other Services       Precautions / Restrictions Precautions Precautions: Fall Restrictions Weight Bearing Restrictions: No      Mobility  Bed Mobility Overal bed mobility: Needs Assistance Bed Mobility: Supine to Sit;Sit to Supine     Supine to sit: Mod assist Sit to supine: Mod assist   General bed mobility comments: requires cues for hand placement and positioning; Exhibits poor trunk control due to rib pain; Required assistance to lift legs into bed;  Transfers Overall transfer level: Needs  assistance Equipment used: Rolling walker (2 wheeled) Transfers: Sit to/from Stand Sit to Stand: Min assist         General transfer comment: with cues for hand placement x1 rep on bed x2 reps on bedside commode;  Ambulation/Gait Ambulation/Gait assistance: Min assist Gait Distance (Feet): 15 Feet Assistive device: Rolling walker (2 wheeled)   Gait velocity: decreased   General Gait Details: exhibits reciprocal gait pattern, patient incontinent throughout entire ambulation; Narrow base of support, slower gait speed;  Stairs            Wheelchair Mobility    Modified Rankin (Stroke Patients Only)       Balance Overall balance assessment: Needs assistance Sitting-balance support: Bilateral upper extremity supported;Feet supported Sitting balance-Leahy Scale: Fair     Standing balance support: Bilateral upper extremity supported Standing balance-Leahy Scale: Poor Standing balance comment: requires min A while standing;                             Pertinent Vitals/Pain Pain Assessment: 0-10 Pain Score: 3  Pain Location: rib pain at rest Pain Descriptors / Indicators: Aching;Sore Pain Intervention(s): Limited activity within patient's tolerance;Monitored during session;Other (comment) (RN provided pain meds prior to PT session;)    Home Living Family/patient expects to be discharged to:: Private residence Living Arrangements: Alone Available Help at Discharge: Friend(s);Other (Comment) Type of Home: House Home Access: Stairs to enter Entrance Stairs-Rails: Right Entrance Stairs-Number of Steps: 4 Home Layout: One level Home Equipment: Walker - 2  wheels;Cane - single point;Grab bars - tub/shower Additional Comments: will have intermittent help from neighbors/friends;    Prior Function Level of Independence: Independent               Hand Dominance        Extremity/Trunk Assessment   Upper Extremity Assessment Upper Extremity  Assessment: Overall WFL for tasks assessed    Lower Extremity Assessment Lower Extremity Assessment: Generalized weakness    Cervical / Trunk Assessment Cervical / Trunk Assessment: Kyphotic  Communication   Communication: No difficulties  Cognition Arousal/Alertness: Awake/alert Behavior During Therapy: WFL for tasks assessed/performed Overall Cognitive Status: Within Functional Limits for tasks assessed                                        General Comments General comments (skin integrity, edema, etc.): has cut on lip; no swelling noted;    Exercises     Assessment/Plan    PT Assessment Patient needs continued PT services  PT Problem List Decreased strength;Decreased mobility;Decreased safety awareness;Decreased activity tolerance;Decreased balance       PT Treatment Interventions Therapeutic activities;Gait training;Therapeutic exercise;Patient/family education;Stair training;Balance training;Functional mobility training;Neuromuscular re-education    PT Goals (Current goals can be found in the Care Plan section)  Acute Rehab PT Goals Patient Stated Goal: to go home PT Goal Formulation: With patient Time For Goal Achievement: 07/07/20 Potential to Achieve Goals: Fair    Frequency Min 2X/week   Barriers to discharge Inaccessible home environment;Decreased caregiver support has 4 steps to enter; has intermittent help;    Co-evaluation               AM-PAC PT "6 Clicks" Mobility  Outcome Measure Help needed turning from your back to your side while in a flat bed without using bedrails?: A Lot Help needed moving from lying on your back to sitting on the side of a flat bed without using bedrails?: A Lot Help needed moving to and from a bed to a chair (including a wheelchair)?: A Lot Help needed standing up from a chair using your arms (e.g., wheelchair or bedside chair)?: A Lot Help needed to walk in hospital room?: A Little Help needed  climbing 3-5 steps with a railing? : A Lot 6 Click Score: 13    End of Session Equipment Utilized During Treatment: Gait belt Activity Tolerance: Patient limited by pain Patient left: in bed;with call bell/phone within reach Nurse Communication: Mobility status PT Visit Diagnosis: Repeated falls (R29.6);Muscle weakness (generalized) (M62.81)    Time: 3154-0086 PT Time Calculation (min) (ACUTE ONLY): 30 min   Charges:   PT Evaluation $PT Eval Low Complexity: 1 Low       Vicci Reder PT, DPT 06/23/2020, 12:57 PM

## 2020-06-23 NOTE — ED Notes (Signed)
PT at bedside at this time

## 2020-06-23 NOTE — ED Notes (Signed)
Patients blood pressure drops significantly when given pain medication. Next RN made aware to evaluate blood pressure before giving pain medication. MD made aware earlier of low blood pressure and ordered midodrine.

## 2020-06-23 NOTE — ED Notes (Signed)
Incorrect charting of blood glucose with results of 32 placed in patients chart by another staff member

## 2020-06-23 NOTE — Progress Notes (Signed)
Patient ID: Brenda [name], female   DOB: 1950-11-24, 69 y.o.   MRN: [id number] [facility] PROGRESS NOTE  Brenda [name] LJQ:[id number] DOB: 01/20/1951 DOA: 06/22/2020 PCP: Ronnald Nian, DO  HPI/Subjective: Patient states that she lives by herself.  She was reaching down to grab something out of a cabinet stood up got dizzy then tripped and fell.  She was found to have 3 rib fractures on the left side.  She does not wear oxygen at home.  Worse pain with any sort of movement.  Patient follows up with Dr. Velvet Bathe oncology as outpatient.  She takes medications for hypertension and does have a history of kidney issues.  Objective: Vitals:   06/23/20 0900 06/23/20 0930  BP: (!) 105/52 (!) 99/45  Pulse: (!) 113 (!) 107  Resp: (!) 22 18  Temp:    SpO2: 95% 95%    Intake/Output Summary (Last 24 hours) at 06/23/2020 1132 Last data filed at 06/23/2020 1018 Gross per 24 hour  Intake 2388.96 ml  Output --  Net 2388.96 ml   Filed Weights   06/22/20 0834  Weight: 83.5 kg    ROS: Review of Systems  Respiratory: Positive for shortness of breath. Negative for cough.   Cardiovascular: Positive for chest pain.  Gastrointestinal: Negative for abdominal pain, nausea and vomiting.  Musculoskeletal: Positive for joint pain.   Exam: Physical Exam HENT:     Nose: No mucosal edema.     Mouth/Throat:     Pharynx: No oropharyngeal exudate.     Comments: Upper lip with scab Eyes:     General: Lids are normal.     Conjunctiva/sclera: Conjunctivae normal.     Pupils: Pupils are equal, round, and reactive to light.  Cardiovascular:     Rate and Rhythm: Regular rhythm. Tachycardia present.     Heart sounds: Normal heart sounds, S1 normal and S2 normal.  Pulmonary:     Breath sounds: Examination of the right-lower field reveals decreased breath sounds. Examination of the left-lower field reveals decreased breath sounds. Decreased breath sounds present. No wheezing, rhonchi or rales.   Abdominal:     Palpations: Abdomen is soft.     Tenderness: There is no abdominal tenderness.  Musculoskeletal:     Right ankle: No swelling.     Left ankle: No swelling.  Skin:    General: Skin is warm.     Findings: No rash.     Comments: When she fell she bit her lip which had some bleeding.  Neurological:     Mental Status: She is alert and oriented to person, place, and time.     Comments: Able to straight leg raise better with the right leg than the left leg.       Data Reviewed: Basic Metabolic Panel: Recent Labs  Lab 06/22/20 0845 06/22/20 1622 06/22/20 2330 06/23/20 0919  NA 132* 135 133* 138  K 3.8 3.1* 3.6 4.5  CL 98 107 105 108  CO2 19* 17* 15* 22  GLUCOSE 90 41* 202* 152*  BUN 77* 62* 67* 56*  CREATININE 4.63* 3.05* 3.60* 2.70*  CALCIUM 7.8* 6.2* 7.2* 7.4*   Liver Function Tests: Recent Labs  Lab 06/22/20 0845 06/22/20 2330  AST 15 24  ALT 12 12  ALKPHOS 71 67  BILITOT 0.9 0.9  PROT 5.8* 5.2*  ALBUMIN 3.4* 3.0*   CBC: Recent Labs  Lab 06/22/20 0845 06/22/20 2330 06/23/20 0919  WBC 15.4* 13.7* 15.4*  HGB 10.9* 9.7* 9.8*  HCT 33.8* 28.8* 30.8*  MCV 88.9 87.0 90.9  PLT 160 160 147*   Cardiac Enzymes: Recent Labs  Lab 06/22/20 0845  CKTOTAL 209    CBG: Recent Labs  Lab 06/22/20 1649 06/22/20 1938 06/22/20 2208 06/22/20 2327 06/23/20 1116  GLUCAP 132* 155* 152* 191* 128*    Recent Results (from the past 240 hour(s))  SARS Coronavirus 2 by RT PCR (hospital order, performed in [facility] hospital lab) Nasopharyngeal Nasopharyngeal Swab     Status: None   Collection Time: 06/22/20 11:44 AM   Specimen: Nasopharyngeal Swab  Result Value Ref Range Status   SARS Coronavirus 2 NEGATIVE NEGATIVE Final    Comment: (NOTE) SARS-CoV-2 target nucleic acids are NOT DETECTED.  The SARS-CoV-2 RNA is generally detectable in upper and lower respiratory specimens during the acute phase of infection. The lowest concentration of SARS-CoV-2  viral copies this assay can detect is 250 copies / mL. A negative result does not preclude SARS-CoV-2 infection and should not be used as the sole basis for treatment or other patient management decisions.  A negative result may occur with improper specimen collection / handling, submission of specimen other than nasopharyngeal swab, presence of viral mutation(s) within the areas targeted by this assay, and inadequate number of viral copies (<250 copies / mL). A negative result must be combined with clinical observations, patient history, and epidemiological information.  Fact Sheet for Patients:   StrictlyIdeas.no  Fact Sheet for Healthcare Providers: BankingDealers.co.za  This test is not yet approved or  cleared by the [place] FDA and has been authorized for detection and/or diagnosis of SARS-CoV-2 by FDA under an Emergency Use Authorization (EUA).  This EUA will remain in effect (meaning this test can be used) for the duration of the COVID-19 declaration under Section 564(b)(1) of the Act, 21 U.S.C. section 360bbb-3(b)(1), unless the authorization is terminated or revoked sooner.  Performed at [facility], [address] [place], [place] [address]   Culture, blood (x 2)     Status: None (Preliminary result)   Collection Time: 06/23/20  1:35 AM   Specimen: BLOOD  Result Value Ref Range Status   Specimen Description BLOOD BLOOD RIGHT HAND  Final   Special Requests   Final    BOTTLES DRAWN AEROBIC AND ANAEROBIC Blood Culture adequate volume   Culture   Final    NO GROWTH < 12 HOURS Performed at [facility], [address], [place], [place] [address]    Report Status PENDING  Incomplete  Culture, blood (x 2)     Status: None (Preliminary result)   Collection Time: 06/23/20  2:30 AM   Specimen: BLOOD  Result Value Ref Range Status   Specimen Description BLOOD BLOOD RIGHT HAND  Final   Special  Requests   Final    BOTTLES DRAWN AEROBIC AND ANAEROBIC Blood Culture adequate volume   Culture   Final    NO GROWTH < 12 HOURS Performed at [facility], [address] [place], [place] [address]    Report Status PENDING  Incomplete     Studies: CT ABDOMEN PELVIS WO CONTRAST  Result Date: 06/22/2020 CLINICAL DATA:  Fall with left flank and rib injury with bruising. Weakness and dizziness. EXAM: CT CHEST, ABDOMEN AND PELVIS WITHOUT CONTRAST TECHNIQUE: Multidetector CT imaging of the chest, abdomen and pelvis was performed following the standard protocol without IV contrast. COMPARISON:  Chest radiograph from earlier today. History of lymphoma. FINDINGS: CT CHEST FINDINGS Cardiovascular: Stable mild  cardiomegaly. Stable trace pericardial effusion/thickening. Three-vessel coronary atherosclerosis. Atherosclerotic nonaneurysmal thoracic aorta. No acute intramural hematoma in the thoracic aorta. Normal caliber pulmonary arteries. Mediastinum/Nodes: No pneumomediastinum. No mediastinal hematoma. No discrete thyroid nodules. Unremarkable esophagus. Moderate bilateral axillary/retropectoral lymphadenopathy, increased. Representative 1.9 cm left axillary node (series 2/image 10), increased from 1.2 cm on 05/01/2018 CT. Representative 1.9 cm right axillary node (series 2/image 18), increased from 1.5 cm. Enlarged 1.9 cm subcarinal node (series 2/image 25), increased from 1.5 cm. New mild bilateral paratracheal adenopathy up to 1.1 cm on the left (series 2/image 16). No discrete hilar adenopathy on these noncontrast images. Lungs/Pleura: No pneumothorax. No pleural effusion. No acute consolidative airspace disease or lung masses. Mild platelike atelectasis at the dependent lung bases bilaterally. Numerous (greater than 20) solid pulmonary nodules scattered throughout both lungs, mildly increased. Representative 0.9 cm medial right middle lobe nodule (series 4/image 73), increased from 0.7 cm.  Representative 0.7 cm anterior right lower lobe nodule (series 4/image 65), increased from 0.6 cm. Representative 0.4 cm superior segment left lower lobe nodule (series 4/image 55), previously 0.3 cm. Musculoskeletal: No aggressive appearing focal osseous lesions. Acute nondisplaced anterior left fifth, sixth, seventh and eighth rib fractures. Marked thoracic spondylosis. Bilateral shoulder arthroplasties, partially visualized. CT ABDOMEN PELVIS FINDINGS Hepatobiliary: Normal liver with no liver mass. Cholelithiasis. No biliary ductal dilatation. Pancreas: Normal, with no laceration, mass or duct dilation. Spleen: Normal size spleen.  No splenic mass. Adrenals/Urinary Tract: Normal adrenals. No hydronephrosis. No contour deforming renal masses. No renal stones. Normal bladder. Stomach/Bowel: Grossly normal stomach. Normal caliber small bowel with no small bowel wall thickening. Diffusely dilated and thick-walled appendix (14 mm diameter) with prominent periappendiceal fat stranding adjacent to the mid to distal appendix, suggesting acute appendicitis. No appendicolith. No periappendiceal free air or measurable collection. Normal large bowel with no diverticulosis, large bowel wall thickening or pericolonic fat stranding. Vascular/Lymphatic: Atherosclerotic nonaneurysmal abdominal aorta. Porta hepatis, aortocaval, left para-aortic and bulky bilateral iliac lymphadenopathy is increased. Representative 2.1 cm porta hepatis node (series 2/image 50), previously 1.9 cm. Representative 1.7 cm left para-aortic node (series 2/image 74), previously 1.2 cm. Representative 4.0 cm left external iliac node (series 2/image 99), previously 3.3 cm. Reproductive: Status post hysterectomy, with no abnormal findings at the vaginal cuff. No adnexal mass. Other: No pneumoperitoneum, ascites or focal fluid collection. Musculoskeletal: No aggressive appearing focal osseous lesions. No fracture in the abdomen or pelvis. Partially visualized  fixation hardware in the proximal left femur. Marked lumbar spondylosis. IMPRESSION: 1. Acute nondisplaced anterior left fifth, sixth, seventh and eighth rib fractures. No pneumothorax. 2. No acute traumatic injury in the abdomen or pelvis. 3. CT findings suggestive of acute appendicitis. No free air or measurable abscess on this noncontrast scan. Surgical consultation advised. 4. Widespread lymphadenopathy, increased since 05/01/2018 CT as detailed, compatible with progression of lymphoma. 5. Numerous nonspecific chronic solid pulmonary nodules scattered throughout both lungs, mildly increased, also potentially due to lymphoma. 6. Stable mild cardiomegaly. Three-vessel coronary atherosclerosis. 7. Cholelithiasis. 8. Aortic Atherosclerosis (ICD10-I70.0). These results were called by telephone at the time of interpretation on 06/22/2020 at 11:20 am to provider Lavonia Drafts, who verbally acknowledged these results. Electronically Signed   By: Ilona Sorrel M.D.   On: 06/22/2020 11:23   CT Chest Wo Contrast  Result Date: 06/22/2020 CLINICAL DATA:  Fall with left flank and rib injury with bruising. Weakness and dizziness. EXAM: CT CHEST, ABDOMEN AND PELVIS WITHOUT CONTRAST TECHNIQUE: Multidetector CT imaging of the chest, abdomen and pelvis was performed  following the standard protocol without IV contrast. COMPARISON:  Chest radiograph from earlier today. History of lymphoma. FINDINGS: CT CHEST FINDINGS Cardiovascular: Stable mild cardiomegaly. Stable trace pericardial effusion/thickening. Three-vessel coronary atherosclerosis. Atherosclerotic nonaneurysmal thoracic aorta. No acute intramural hematoma in the thoracic aorta. Normal caliber pulmonary arteries. Mediastinum/Nodes: No pneumomediastinum. No mediastinal hematoma. No discrete thyroid nodules. Unremarkable esophagus. Moderate bilateral axillary/retropectoral lymphadenopathy, increased. Representative 1.9 cm left axillary node (series 2/image 10), increased  from 1.2 cm on 05/01/2018 CT. Representative 1.9 cm right axillary node (series 2/image 18), increased from 1.5 cm. Enlarged 1.9 cm subcarinal node (series 2/image 25), increased from 1.5 cm. New mild bilateral paratracheal adenopathy up to 1.1 cm on the left (series 2/image 16). No discrete hilar adenopathy on these noncontrast images. Lungs/Pleura: No pneumothorax. No pleural effusion. No acute consolidative airspace disease or lung masses. Mild platelike atelectasis at the dependent lung bases bilaterally. Numerous (greater than 20) solid pulmonary nodules scattered throughout both lungs, mildly increased. Representative 0.9 cm medial right middle lobe nodule (series 4/image 73), increased from 0.7 cm. Representative 0.7 cm anterior right lower lobe nodule (series 4/image 65), increased from 0.6 cm. Representative 0.4 cm superior segment left lower lobe nodule (series 4/image 55), previously 0.3 cm. Musculoskeletal: No aggressive appearing focal osseous lesions. Acute nondisplaced anterior left fifth, sixth, seventh and eighth rib fractures. Marked thoracic spondylosis. Bilateral shoulder arthroplasties, partially visualized. CT ABDOMEN PELVIS FINDINGS Hepatobiliary: Normal liver with no liver mass. Cholelithiasis. No biliary ductal dilatation. Pancreas: Normal, with no laceration, mass or duct dilation. Spleen: Normal size spleen.  No splenic mass. Adrenals/Urinary Tract: Normal adrenals. No hydronephrosis. No contour deforming renal masses. No renal stones. Normal bladder. Stomach/Bowel: Grossly normal stomach. Normal caliber small bowel with no small bowel wall thickening. Diffusely dilated and thick-walled appendix (14 mm diameter) with prominent periappendiceal fat stranding adjacent to the mid to distal appendix, suggesting acute appendicitis. No appendicolith. No periappendiceal free air or measurable collection. Normal large bowel with no diverticulosis, large bowel wall thickening or pericolonic fat  stranding. Vascular/Lymphatic: Atherosclerotic nonaneurysmal abdominal aorta. Porta hepatis, aortocaval, left para-aortic and bulky bilateral iliac lymphadenopathy is increased. Representative 2.1 cm porta hepatis node (series 2/image 50), previously 1.9 cm. Representative 1.7 cm left para-aortic node (series 2/image 74), previously 1.2 cm. Representative 4.0 cm left external iliac node (series 2/image 99), previously 3.3 cm. Reproductive: Status post hysterectomy, with no abnormal findings at the vaginal cuff. No adnexal mass. Other: No pneumoperitoneum, ascites or focal fluid collection. Musculoskeletal: No aggressive appearing focal osseous lesions. No fracture in the abdomen or pelvis. Partially visualized fixation hardware in the proximal left femur. Marked lumbar spondylosis. IMPRESSION: 1. Acute nondisplaced anterior left fifth, sixth, seventh and eighth rib fractures. No pneumothorax. 2. No acute traumatic injury in the abdomen or pelvis. 3. CT findings suggestive of acute appendicitis. No free air or measurable abscess on this noncontrast scan. Surgical consultation advised. 4. Widespread lymphadenopathy, increased since 05/01/2018 CT as detailed, compatible with progression of lymphoma. 5. Numerous nonspecific chronic solid pulmonary nodules scattered throughout both lungs, mildly increased, also potentially due to lymphoma. 6. Stable mild cardiomegaly. Three-vessel coronary atherosclerosis. 7. Cholelithiasis. 8. Aortic Atherosclerosis (ICD10-I70.0). These results were called by telephone at the time of interpretation on 06/22/2020 at 11:20 am to provider Lavonia Drafts, who verbally acknowledged these results. Electronically Signed   By: Ilona Sorrel M.D.   On: 06/22/2020 11:23   DG Chest Port 1 View  Result Date: 06/22/2020 CLINICAL DATA:  68 year old female with history of weakness for  the past 3 weeks and dizziness for the past 24 hours. EXAM: PORTABLE CHEST 1 VIEW COMPARISON:  Chest x-ray  10/24/2012. FINDINGS: Lung volumes are low. No consolidative airspace disease. No pleural effusions. No pneumothorax. No pulmonary nodule or mass noted. Pulmonary vasculature and the cardiomediastinal silhouette are within normal limits. Atherosclerosis in the thoracic aorta. Status post bilateral shoulder arthroplasty. IMPRESSION: 1. Low lung volumes without radiographic evidence of acute cardiopulmonary disease. 2. Aortic atherosclerosis. Electronically Signed   By: Vinnie Langton M.D.   On: 06/22/2020 09:09    Scheduled Meds: . aspirin  81 mg Oral QODAY  . buPROPion  300 mg Oral Daily  . heparin  5,000 Units Subcutaneous Q8H  . insulin aspart  0-15 Units Subcutaneous Q4H  . iron polysaccharides  150 mg Oral QHS  . lidocaine  1 patch Transdermal Q24H  . simvastatin  40 mg Oral QHS  . vitamin B-12  1,000 mcg Oral QPM   Continuous Infusions: . cefTRIAXone (ROCEPHIN)  IV Stopped (06/22/20 1543)  . dextrose 5 % and 0.9% NaCl Stopped (06/23/20 0105)  . lactated ringers 125 mL/hr at 06/23/20 0332  . metronidazole Stopped (06/23/20 1018)    Assessment/Plan:  1. Acute hypoxic respiratory failure.  Pulse ox 86% on room air.  Currently on oxygen 2. 3 rib fractures on the left side after fall.  Pain control with oral oxycodone.  Physical therapy evaluation 3. Acute kidney injury on chronic kidney disease stage IIIa.  Patient also has hypotension.  Hold all antihypertensive medications.  Continue IV fluids.  Creatinine improved from 4.63 down to 2.70.  Recheck creatinine tomorrow. 4. 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  Patient wants to follow-up with her oncologist Dr. Velvet Bathe as outpatient. 5. Depression anxiety on Wellbutrin 6. Hyperlipidemia unspecified on simvastatin 7. This is not appendicitis.  Antibiotics stopped.    Code Status:     Code Status Orders  (From admission, onward)         Start     Ordered   06/22/20 1542  Do not attempt resuscitation (DNR)   Continuous       Question Answer Comment  In the event of cardiac or respiratory ARREST Do not call a "code blue"   In the event of cardiac or respiratory ARREST Do not perform Intubation, CPR, defibrillation or ACLS   In the event of cardiac or respiratory ARREST Use medication by any route, position, wound care, and other measures to relive pain and suffering. May use oxygen, suction and manual treatment of airway obstruction as needed for comfort.   Comments CODE STATUS was discussed with patient and she wishes to be placed on a DO NOT RESUSCITATE status      06/22/20 1541        Code Status History    Date Active Date Inactive Code Status Order ID Comments User Context   06/22/2020 1353 06/22/2020 1541 Full Code [id number]  Collier Bullock, MD ED   01/10/2020 1101 01/11/2020 1609 Full Code [id number]  Marcellus Scott Inpatient   11/18/2017 0100 11/21/2017 1928 Full Code [id number]  Vianne Bulls, MD ED   08/19/2016 1542 08/20/2016 1521 Full Code [id number]  Marcellus Scott Inpatient   10/25/2012 1646 10/27/2012 1816 Full Code [id number]  Diamantina Monks, RN Inpatient   10/24/2012 2330 10/25/2012 1646 Full Code [id number]  Franco Collet, RN Inpatient   12/20/2011 1136 12/23/2011 1847 Full Code [id number]  Wynelle Bourgeois, RN Inpatient   Advance Care Planning Activity  Family Communication: Does not have any family did not want me to call anybody Disposition Plan: Status is: Inpatient  Dispo: The patient is from: Home              Anticipated d/c is to: We will see how she does with physical therapy              Anticipated d/c date is: Likely will need a few days here in the hospital watching improving kidney function              Patient currently receiving IV fluids for hypotension and acute kidney injury.  Patient also on oxygen for acute hypoxic respiratory failure secondary to rib fractures and difficulty taking deep breath  Consultants:  Nephrology  General  surgery  Time spent: 27 minutes  [place]

## 2020-06-23 NOTE — ED Notes (Signed)
Patient given water,chocolate icecream and applesauce

## 2020-06-23 NOTE — Progress Notes (Signed)
[facility] SURGICAL PROGRESS NOTE (cpt [phone])  Hospital Day(s): 1.   Interval History: Patient seen and examined, no acute events or new complaints overnight. Patient reports her primary complaint is left sided chest pain over her rib fractures which is worse with movement, denies fever, chills, nausea, emesis, or abdominal pain. Leukocytosis slightly worse this morning to 15.4K. Renal function remains elevated but improved with sCr - 2.70. BCx without growth <12 hours.    Review of Systems:  Constitutional: denies fever, chills  HEENT: denies cough or congestion  Respiratory: denies any shortness of breath  Cardiovascular: + CP (Musculoskeletal) Gastrointestinal: denies abdominal pain, N/V, or diarrhea/and bowel function as per interval history Genitourinary: denies burning with urination or urinary frequency Musculoskeletal: denies pain, decreased motor or sensation  Vital signs in last 24 hours: [min-max] current  Pulse Rate:  [51-127] 107 (08/23 0815) Resp:  [9-29] 20 (08/23 0815) BP: (67-140)/(27-119) 105/53 (08/23 0800) SpO2:  [86 %-100 %] 99 % (08/23 0815)     Height: 4' 11"  (149.9 cm) Weight: 83.5 kg BMI (Calculated): 37.14   Intake/Output last 2 shifts:  08/22 0701 - 08/23 0700 In: 3130 [I.V.:1990; IV Piggyback:1140] Out: -    Physical Exam:  Constitutional: alert, cooperative, appears uncomfortable, tearful HENT: normocephalic without obvious abnormality  Eyes: PERRL, EOM's grossly intact and symmetric  Respiratory: On [place], breathing non-labored at rest  Cardiovascular: tachycardic, sinus rhythm  Chest: Tenderness over left chest wall Gastrointestinal: soft, non-tender, and non-distended, no rebound/guarding Musculoskeletal: no edema or wounds, motor and sensation grossly intact, NT    Labs:  CBC Latest Ref Rng & Units 06/23/2020 06/22/2020 06/22/2020  WBC 4.0 - 10.5 K/uL 15.4(H) 13.7(H) 15.4(H)  Hemoglobin 12.0 - 15.0 g/dL 9.8(L) 9.7(L) 10.9(L)   Hematocrit 36 - 46 % 30.8(L) 28.8(L) 33.8(L)  Platelets 150 - 400 K/uL 147(L) 160 160   CMP Latest Ref Rng & Units 06/22/2020 06/22/2020 06/22/2020  Glucose 70 - 99 mg/dL 202(H) 41(LL) 90  BUN 8 - 23 mg/dL 67(H) 62(H) 77(H)  Creatinine 0.44 - 1.00 mg/dL 3.60(H) 3.05(H) 4.63(H)  Sodium 135 - 145 mmol/L 133(L) 135 132(L)  Potassium 3.5 - 5.1 mmol/L 3.6 3.1(L) 3.8  Chloride 98 - 111 mmol/L 105 107 98  CO2 22 - 32 mmol/L 15(L) 17(L) 19(L)  Calcium 8.9 - 10.3 mg/dL 7.2(L) 6.2(LL) 7.8(L)  Total Protein 6.5 - 8.1 g/dL 5.2(L) - 5.8(L)  Total Bilirubin 0.3 - 1.2 mg/dL 0.9 - 0.9  Alkaline Phos 38 - 126 U/L 67 - 71  AST 15 - 41 U/L 24 - 15  ALT 0 - 44 U/L 12 - 12    Imaging studies: No new pertinent imaging studies   Assessment/Plan: (ICD-10's: S22.42XA) 69 y.o. 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 Oncology consultation pending this morning. No need for emergent surgical intervention  - Continue pulmonary tolieting for rib fractures; Incentive spirometer  - Pain control prn; added robaxin to try and aid in pain control  - Monitor abdominal examination  - Trend leukocytosis   - Mobilization as tolerates  - further management per primary service   All of the above findings and recommendations were discussed with the patient, and the medical team, and all of patient's questions  were answered to her expressed satisfaction.  -- Edison Simon, PA-C [facility] 06/23/2020, 9:49 AM [phone] M-F: 7am - 4pm

## 2020-06-23 NOTE — Consult Note (Signed)
[facility] CONSULT NOTE    Date: 06/23/2020                  Patient Name:  Brenda [name]  MRN: [id number]  DOB: 11-19-1950  Age / Sex: 69 y.o., female         PCP: Ronnald Nian, DO                 Service Requesting Consult:  Hospitalist                 Reason for Consult:  Acute kidney injury            History of Present Illness: Patient is a 69 y.o. 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  She lives by herself and does not have any family members.  She called her neighbor who helped her get to the hospital.  She feels she was down for about an hour.  She was found to have lip abrasion and left rib fracture.  Work-up in the emergency room showed increased creatinine of 4.63.  Creatinine improved to 2.70 today with IV fluids.  Baseline creatinine appears to be 1.43/GFR 37 from January 03, 2020.  Creatinine improved to 2.70 today with hydration   Medications: Outpatient medications: (Not in a hospital admission)   Current medications: Current Facility-Administered Medications  Medication Dose Route Frequency Provider Last Rate Last Admin  . acetaminophen (TYLENOL) tablet 500-1,000 mg  500-1,000 mg Oral Q6H PRN Agbata, Tochukwu, MD   1,000 mg at 06/23/20 0843  . ALPRAZolam Duanne Moron) tablet 0.5 mg  0.5 mg Oral BID PRN Agbata, Tochukwu, MD   0.5 mg at 06/22/20 1842  . aspirin chewable tablet 81 mg  81 mg Oral QODAY Agbata, Tochukwu, MD   81 mg at 06/22/20 1540  . buPROPion (WELLBUTRIN XL) 24 hr tablet 300 mg  300 mg Oral Daily Agbata, Tochukwu, MD   300 mg at 06/23/20 0917  . cefTRIAXone (ROCEPHIN) 2 g in sodium chloride 0.9 % 100 mL IVPB  2 g Intravenous Q24H Collier Bullock, MD   Stopped  at 06/22/20 1543  . dextrose 5 %-0.9 % sodium chloride infusion   Intravenous Continuous Agbata, Tochukwu, MD   Stopped at 06/23/20 0105  . heparin injection 5,000 Units  5,000 Units Subcutaneous Q8H Agbata, Tochukwu, MD   5,000 Units at 06/23/20 1442  . insulin aspart (novoLOG) injection 0-15 Units  0-15 Units Subcutaneous Q4H Agbata, Tochukwu, MD   2 Units at 06/23/20 1134  . iron polysaccharides (NIFEREX) capsule 150 mg  150 mg Oral QHS Agbata, Tochukwu, MD   150 mg at 06/22/20 2228  . lactated ringers infusion   Intravenous Continuous Loletha Grayer, MD 75 mL/hr at 06/23/20 1156 Rate Change at 06/23/20 1156  . lidocaine (LIDODERM) 5 % 1 patch  1 patch Transdermal Q24H Agbata, Tochukwu, MD   1 patch at 06/23/20 1442  . methocarbamol (ROBAXIN) tablet 500 mg  500 mg Oral Q8H PRN Tylene Fantasia, PA-C      . metroNIDAZOLE (FLAGYL) IVPB 500 mg  500 mg Intravenous Q8H Agbata, Tochukwu, MD   Stopped at 06/23/20 1018  . midodrine (PROAMATINE) tablet 5 mg  5 mg  Oral TID WC Loletha Grayer, MD   5 mg at 06/23/20 1239  . ondansetron (ZOFRAN) tablet 4 mg  4 mg Oral Q6H PRN Agbata, Tochukwu, MD       Or  . ondansetron (ZOFRAN) injection 4 mg  4 mg Intravenous Q6H PRN Agbata, Tochukwu, MD      . oxyCODONE (Oxy IR/ROXICODONE) immediate release tablet 5 mg  5 mg Oral Q4H PRN Loletha Grayer, MD   5 mg at 06/23/20 0847  . simvastatin (ZOCOR) tablet 40 mg  40 mg Oral QHS Agbata, Tochukwu, MD   40 mg at 06/22/20 2227  . triamcinolone cream (KENALOG) 0.1 % 1 application  1 application Topical BID PRN Agbata, Tochukwu, MD      . vitamin B-12 (CYANOCOBALAMIN) tablet 1,000 mcg  1,000 mcg Oral QPM Rowland Lathe, RPH   1,000 mcg at 06/22/20 1752   Current Outpatient Medications  Medication Sig Dispense Refill  . acetaminophen (TYLENOL) 500 MG tablet Take 500-1,000 mg by mouth every 6 (six) hours as needed for mild pain, moderate pain or fever.     . ALPRAZolam (XANAX) 1 MG tablet Take 0.5-1 tablets (0.5-1  mg total) by mouth 2 (two) times daily as needed. for anxiety 60 tablet 2  . aspirin 81 MG tablet Take 81 mg by mouth every other day.     . benazepril-hydrochlorthiazide (LOTENSIN HCT) 20-12.5 MG tablet Take 1 tablet by mouth daily. 90 tablet 3  . Blood Glucose Monitoring Suppl (ONETOUCH VERIO) w/Device KIT Use to check blood sugar 3 times a day 1 kit 0  . buPROPion (WELLBUTRIN XL) 300 MG 24 hr tablet Take 1 tablet (300 mg total) by mouth daily. 90 tablet 2  . Cholecalciferol (VITAMIN D3) 2000 units TABS Take 2,000 mcg by mouth daily.    . Cyanocobalamin (B-12) 1000 MCG SUBL Place 1,000 mcg under the tongue every evening.     Marland Kitchen doxazosin (CARDURA) 2 MG tablet TAKE 1 TABLET(2 MG) BY MOUTH DAILY (Patient taking differently: Take 2 mg by mouth daily. ) 30 tablet 4  . esomeprazole (NEXIUM) 20 MG capsule Take 20 mg by mouth daily as needed (acid reflux symptoms).     . ezetimibe (ZETIA) 10 MG tablet Take 1 tablet (10 mg total) by mouth daily. (Patient taking differently: Take 100 mg by mouth every evening. ) 90 tablet 3  . gabapentin (NEURONTIN) 300 MG capsule Take 1 capsule (300 mg total) by mouth at bedtime. 90 capsule 3  . glipiZIDE (GLUCOTROL XL) 2.5 MG 24 hr tablet Take 2.5 mg by mouth 2 (two) times daily with a meal.    . insulin glargine (LANTUS SOLOSTAR) 100 UNIT/ML Solostar Pen Inject 22-24 Units into the skin at bedtime. (Patient taking differently: Inject 24 Units into the skin at bedtime. ) 18 mL 3  . Insulin Pen Needle 32G X 4 MM MISC Use 1x a day 100 each 3  . iron polysaccharides (NIFEREX) 150 MG capsule Take 150 mg by mouth at bedtime.     Elmore Guise Devices (LANCING DEVICE) MISC Use as advised - Freestyle 1 each 0  . metoprolol tartrate (LOPRESSOR) 25 MG tablet TAKE 1 TABLET BY MOUTH ONCE DAILY (Patient taking differently: Take 25 mg by mouth daily. ) 90 tablet 0  . NONFORMULARY OR COMPOUNDED ITEM Apply 1 application topically 2 (two) times daily as needed (sun damage on face).  FLUOROURACIL 5% + CALCIPOTRIENE 0.005%    . OneTouch Delica Lancets 31V MISC 1 each by Does not  apply route 3 (three) times daily. To check blood sugar 3 times a day 300 each 11  . simvastatin (ZOCOR) 40 MG tablet Take 1 tablet (40 mg total) by mouth at bedtime. 90 tablet 0  . triamcinolone cream (KENALOG) 0.1 % Apply 1 application topically 2 (two) times daily as needed (skin rash.).     Marland Kitchen glucose blood (ONETOUCH VERIO) test strip Use to check blood sugar 3 times a day. 300 each 12      Allergies: Allergies  Allergen Reactions  . Amlodipine     Swelling    . Nsaids     Due to kidney function      Past Medical History: Past Medical History:  Diagnosis Date  . Anxiety   . Arthritis   . CLL (chronic lymphocytic leukemia) (HCC)    Dx. 2019 Dr. Irene Limbo'  . Depression    Wellbutrin  . Diabetes mellitus    Type II  . Endometrial ca ([place])    1998  . 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 GERD (gastroesophageal reflux disease)   . Heart murmur    patient states "cardiologist has not heard heart murmur for past several years"  . Heel spur   . History of hiatal hernia    small  . 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 Hyperkalemia   . Hyperlipidemia   . Hypertension   . Left leg swelling    "swelling in left leg from knee down when sitting for prolonged periods or being on feet for a long period of time"  . PONV (postoperative nausea and vomiting)    after hysterectomy  . Renal insufficiency    Stage 3 b   Dr. Particia Nearing first visit 11-2019     Past Surgical History: Past Surgical History:  Procedure Laterality Date  . ABDOMINAL HYSTERECTOMY  1998  . COLONOSCOPY    . EYE SURGERY Bilateral    cataracts  . EYE SURGERY Left    retina tear  . FEMUR IM NAIL  10/25/2012   Procedure: INTRAMEDULLARY (IM) NAIL FEMORAL;  Surgeon: Mauri Pole, MD;   Location: [facility];  Service: Orthopedics;  Laterality: Left;  . HIP SURGERY     Fracture L IM nail and 3 rods  . left knee arthroscopy  12/2010  . LYMPH NODE BIOPSY     axillary left 12-2018  . REFRACTIVE SURGERY    . skin cancer removed right and and lower forearm     squamoun in situ  . TOTAL KNEE ARTHROPLASTY  12/20/2011   Procedure: TOTAL KNEE ARTHROPLASTY;  Surgeon: Gearlean Alf, MD;  Location: [facility];  Service: Orthopedics;  Laterality: Left;  Failed attempt at spinal   . TOTAL SHOULDER ARTHROPLASTY Right 08/19/2016   Procedure: RIGHT TOTAL SHOULDER ARTHROPLASTY;  Surgeon: Justice Britain, MD;  Location: [place];  Service: Orthopedics;  Laterality: Right;  . TOTAL SHOULDER ARTHROPLASTY Left 01/10/2020   Procedure: TOTAL SHOULDER ARTHROPLASTY;  Surgeon: Justice Britain, MD;  Location: [facility];  Service: Orthopedics;  Laterality: Left;  130mn     Family History: Family History  Problem Relation Age of Onset  . Cancer Mother        breast cancer  . Cancer Father        plasma sarcoma  . Breast cancer Neg Hx      Social  History: Social History   Socioeconomic History  . Marital status: Single    Spouse name: Not on file  . Number of children: Not on file  . Years of education: Not on file  . Highest education level: Not on file  Occupational History  . 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 Smoking status: Never Smoker  . Smokeless tobacco: Never Used  Vaping Use  . Vaping Use: Never used  Substance and Sexual Activity  . Alcohol use: No  . Drug use: No  . Sexual activity: Not Currently  Other Topics Concern  . 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 Difficulty of Paying Living Expenses: Not on file  Food Insecurity:   . Worried About Charity fundraiser in the Last Year: Not on file  . Ran Out of Food in the Last Year: Not on  file  Transportation Needs:   . Lack of Transportation (Medical): Not on file  . Lack of Transportation (Non-Medical): Not on file  Physical Activity:   . Days of Exercise per Week: Not on file  . Minutes of Exercise per Session: Not on file  Stress:   . Feeling of Stress : Not on file  Social Connections:   . Frequency of Communication with Friends and Family: Not on file  . Frequency of Social Gatherings with Friends and Family: Not on file  . Attends Religious Services: Not on file  . Active Member of Clubs or Organizations: Not on file  . Attends Archivist Meetings: Not on file  . Marital Status: Not on file  Intimate Partner Violence:   . Fear of Current or Ex-Partner: Not on file  . Emotionally Abused: Not on file  . Physically Abused: Not on file  . Sexually Abused: Not on file     Review of Systems: Review of Systems  Constitutional: Positive for malaise/fatigue. Negative for chills and fever.  HENT: Negative for congestion, hearing loss and tinnitus.   Eyes: Negative for blurred vision and double vision.  Respiratory: Negative for cough, sputum production and shortness of breath.   Cardiovascular: Negative for chest pain, palpitations and orthopnea.  Gastrointestinal: Negative for diarrhea, nausea and vomiting.  Genitourinary: Negative for dysuria, frequency and urgency.  Musculoskeletal: Negative for myalgias.  Skin: Negative for itching and rash.  Neurological: Positive for dizziness and weakness. Negative for focal weakness.  Endo/Heme/Allergies: Negative for polydipsia. Does not bruise/bleed easily.  Psychiatric/Behavioral: The patient is not nervous/anxious.      Vital Signs: Blood pressure 117/67, pulse (!) 112, temperature 98 F (36.7 C), temperature source Oral, resp. rate (!) 22, height 4' 11"  (1.499 m), weight 83.5 kg, SpO2 92 %.  Weight trends: Filed Weights   06/22/20 0834  Weight: 83.5 kg    Physical Exam: Physical Exam: General:   No acute distress  Head:  Normocephalic, atraumatic. Moist oral mucosal membranes  Eyes:  Anicteric  Neck:  Supple  Lungs:   Clear to auscultation, normal effort  Heart:  S1S2 no rubs  Abdomen:   Soft, nontender, bowel sounds present  Extremities:  No peripheral edema.  Neurologic:  Awake, alert, following commands  Skin:  No lesions        Lab results: Basic Metabolic Panel: Recent  Labs  Lab 06/22/20 1622 06/22/20 2330 06/23/20 0919  NA 135 133* 138  K 3.1* 3.6 4.5  CL 107 105 108  CO2 17* 15* 22  GLUCOSE 41* 202* 152*  BUN 62* 67* 56*  CREATININE 3.05* 3.60* 2.70*  CALCIUM 6.2* 7.2* 7.4*    Liver Function Tests: Recent Labs  Lab 06/22/20 0845 06/22/20 2330  AST 15 24  ALT 12 12  ALKPHOS 71 67  BILITOT 0.9 0.9  PROT 5.8* 5.2*  ALBUMIN 3.4* 3.0*   No results for input(s): LIPASE, AMYLASE in the last 168 hours. No results for input(s): AMMONIA in the last 168 hours.  CBC: Recent Labs  Lab 06/22/20 0845 06/22/20 2330 06/23/20 0919  WBC 15.4* 13.7* 15.4*  HGB 10.9* 9.7* 9.8*  HCT 33.8* 28.8* 30.8*  MCV 88.9 87.0 90.9  PLT 160 160 147*    Cardiac Enzymes: Recent Labs  Lab 06/22/20 0845  CKTOTAL 209    BNP: Invalid input(s): POCBNP  CBG: Recent Labs  Lab 06/22/20 1649 06/22/20 1938 06/22/20 2208 06/22/20 2327 06/23/20 1116  GLUCAP 132* 155* 152* 191* 128*    Microbiology: Results for orders placed or performed during the hospital encounter of 06/22/20  SARS Coronavirus 2 by RT PCR (hospital order, performed in [facility] hospital lab) Nasopharyngeal Nasopharyngeal Swab     Status: None   Collection Time: 06/22/20 11:44 AM   Specimen: Nasopharyngeal Swab  Result Value Ref Range Status   SARS Coronavirus 2 NEGATIVE NEGATIVE Final    Comment: (NOTE) SARS-CoV-2 target nucleic acids are NOT DETECTED.  The SARS-CoV-2 RNA is generally detectable in upper and lower respiratory specimens during the acute phase of infection. The  lowest concentration of SARS-CoV-2 viral copies this assay can detect is 250 copies / mL. A negative result does not preclude SARS-CoV-2 infection and should not be used as the sole basis for treatment or other patient management decisions.  A negative result may occur with improper specimen collection / handling, submission of specimen other than nasopharyngeal swab, presence of viral mutation(s) within the areas targeted by this assay, and inadequate number of viral copies (<250 copies / mL). A negative result must be combined with clinical observations, patient history, and epidemiological information.  Fact Sheet for Patients:   StrictlyIdeas.no  Fact Sheet for Healthcare Providers: BankingDealers.co.za  This test is not yet approved or  cleared by the [place] FDA and has been authorized for detection and/or diagnosis of SARS-CoV-2 by FDA under an Emergency Use Authorization (EUA).  This EUA will remain in effect (meaning this test can be used) for the duration of the COVID-19 declaration under Section 564(b)(1) of the Act, 21 U.S.C. section 360bbb-3(b)(1), unless the authorization is terminated or revoked sooner.  Performed at [facility], [address] [place], [place] [address]   Culture, blood (x 2)     Status: None (Preliminary result)   Collection Time: 06/23/20  1:35 AM   Specimen: BLOOD  Result Value Ref Range Status   Specimen Description BLOOD BLOOD RIGHT HAND  Final   Special Requests   Final    BOTTLES DRAWN AEROBIC AND ANAEROBIC Blood Culture adequate volume   Culture   Final    NO GROWTH < 12 HOURS Performed at [facility], [address] [place], [place] [address]    Report Status PENDING  Incomplete  Culture, blood (x 2)     Status: None (Preliminary result)   Collection Time: 06/23/20  2:30 AM  Specimen: BLOOD  Result Value Ref Range Status   Specimen Description BLOOD BLOOD  RIGHT HAND  Final   Special Requests   Final    BOTTLES DRAWN AEROBIC AND ANAEROBIC Blood Culture adequate volume   Culture   Final    NO GROWTH < 12 HOURS Performed at [facility], [address] [place], [place] [address]    Report Status PENDING  Incomplete    Coagulation Studies: Recent Labs    06/23/20 0229  LABPROT 14.7  INR 1.2    Urinalysis: Recent Labs    06/22/20 1144  COLORURINE YELLOW*  LABSPEC 1.014  PHURINE 5.0  GLUCOSEU NEGATIVE  HGBUR MODERATE*  BILIRUBINUR NEGATIVE  KETONESUR NEGATIVE  PROTEINUR NEGATIVE  NITRITE NEGATIVE  LEUKOCYTESUR NEGATIVE      Imaging: CT ABDOMEN PELVIS WO CONTRAST  Result Date: 06/22/2020 CLINICAL DATA:  Fall with left flank and rib injury with bruising. Weakness and dizziness. EXAM: CT CHEST, ABDOMEN AND PELVIS WITHOUT CONTRAST TECHNIQUE: Multidetector CT imaging of the chest, abdomen and pelvis was performed following the standard protocol without IV contrast. COMPARISON:  Chest radiograph from earlier today. History of lymphoma. FINDINGS: CT CHEST FINDINGS Cardiovascular: Stable mild cardiomegaly. Stable trace pericardial effusion/thickening. Three-vessel coronary atherosclerosis. Atherosclerotic nonaneurysmal thoracic aorta. No acute intramural hematoma in the thoracic aorta. Normal caliber pulmonary arteries. Mediastinum/Nodes: No pneumomediastinum. No mediastinal hematoma. No discrete thyroid nodules. Unremarkable esophagus. Moderate bilateral axillary/retropectoral lymphadenopathy, increased. Representative 1.9 cm left axillary node (series 2/image 10), increased from 1.2 cm on 05/01/2018 CT. Representative 1.9 cm right axillary node (series 2/image 18), increased from 1.5 cm. Enlarged 1.9 cm subcarinal node (series 2/image 25), increased from 1.5 cm. New mild bilateral paratracheal adenopathy up to 1.1 cm on the left (series 2/image 16). No discrete hilar adenopathy on these noncontrast images. Lungs/Pleura: No  pneumothorax. No pleural effusion. No acute consolidative airspace disease or lung masses. Mild platelike atelectasis at the dependent lung bases bilaterally. Numerous (greater than 20) solid pulmonary nodules scattered throughout both lungs, mildly increased. Representative 0.9 cm medial right middle lobe nodule (series 4/image 73), increased from 0.7 cm. Representative 0.7 cm anterior right lower lobe nodule (series 4/image 65), increased from 0.6 cm. Representative 0.4 cm superior segment left lower lobe nodule (series 4/image 55), previously 0.3 cm. Musculoskeletal: No aggressive appearing focal osseous lesions. Acute nondisplaced anterior left fifth, sixth, seventh and eighth rib fractures. Marked thoracic spondylosis. Bilateral shoulder arthroplasties, partially visualized. CT ABDOMEN PELVIS FINDINGS Hepatobiliary: Normal liver with no liver mass. Cholelithiasis. No biliary ductal dilatation. Pancreas: Normal, with no laceration, mass or duct dilation. Spleen: Normal size spleen.  No splenic mass. Adrenals/Urinary Tract: Normal adrenals. No hydronephrosis. No contour deforming renal masses. No renal stones. Normal bladder. Stomach/Bowel: Grossly normal stomach. Normal caliber small bowel with no small bowel wall thickening. Diffusely dilated and thick-walled appendix (14 mm diameter) with prominent periappendiceal fat stranding adjacent to the mid to distal appendix, suggesting acute appendicitis. No appendicolith. No periappendiceal free air or measurable collection. Normal large bowel with no diverticulosis, large bowel wall thickening or pericolonic fat stranding. Vascular/Lymphatic: Atherosclerotic nonaneurysmal abdominal aorta. Porta hepatis, aortocaval, left para-aortic and bulky bilateral iliac lymphadenopathy is increased. Representative 2.1 cm porta hepatis node (series 2/image 50), previously 1.9 cm. Representative 1.7 cm left para-aortic node (series 2/image 74), previously 1.2 cm. Representative  4.0 cm left external iliac node (series 2/image 99), previously 3.3 cm. Reproductive: Status post hysterectomy, with no abnormal findings at the vaginal cuff. No adnexal mass. Other: No pneumoperitoneum,  ascites or focal fluid collection. Musculoskeletal: No aggressive appearing focal osseous lesions. No fracture in the abdomen or pelvis. Partially visualized fixation hardware in the proximal left femur. Marked lumbar spondylosis. IMPRESSION: 1. Acute nondisplaced anterior left fifth, sixth, seventh and eighth rib fractures. No pneumothorax. 2. No acute traumatic injury in the abdomen or pelvis. 3. CT findings suggestive of acute appendicitis. No free air or measurable abscess on this noncontrast scan. Surgical consultation advised. 4. Widespread lymphadenopathy, increased since 05/01/2018 CT as detailed, compatible with progression of lymphoma. 5. Numerous nonspecific chronic solid pulmonary nodules scattered throughout both lungs, mildly increased, also potentially due to lymphoma. 6. Stable mild cardiomegaly. Three-vessel coronary atherosclerosis. 7. Cholelithiasis. 8. Aortic Atherosclerosis (ICD10-I70.0). These results were called by telephone at the time of interpretation on 06/22/2020 at 11:20 am to provider Lavonia Drafts, who verbally acknowledged these results. Electronically Signed   By: Ilona Sorrel M.D.   On: 06/22/2020 11:23   CT Chest Wo Contrast  Result Date: 06/22/2020 CLINICAL DATA:  Fall with left flank and rib injury with bruising. Weakness and dizziness. EXAM: CT CHEST, ABDOMEN AND PELVIS WITHOUT CONTRAST TECHNIQUE: Multidetector CT imaging of the chest, abdomen and pelvis was performed following the standard protocol without IV contrast. COMPARISON:  Chest radiograph from earlier today. History of lymphoma. FINDINGS: CT CHEST FINDINGS Cardiovascular: Stable mild cardiomegaly. Stable trace pericardial effusion/thickening. Three-vessel coronary atherosclerosis. Atherosclerotic nonaneurysmal  thoracic aorta. No acute intramural hematoma in the thoracic aorta. Normal caliber pulmonary arteries. Mediastinum/Nodes: No pneumomediastinum. No mediastinal hematoma. No discrete thyroid nodules. Unremarkable esophagus. Moderate bilateral axillary/retropectoral lymphadenopathy, increased. Representative 1.9 cm left axillary node (series 2/image 10), increased from 1.2 cm on 05/01/2018 CT. Representative 1.9 cm right axillary node (series 2/image 18), increased from 1.5 cm. Enlarged 1.9 cm subcarinal node (series 2/image 25), increased from 1.5 cm. New mild bilateral paratracheal adenopathy up to 1.1 cm on the left (series 2/image 16). No discrete hilar adenopathy on these noncontrast images. Lungs/Pleura: No pneumothorax. No pleural effusion. No acute consolidative airspace disease or lung masses. Mild platelike atelectasis at the dependent lung bases bilaterally. Numerous (greater than 20) solid pulmonary nodules scattered throughout both lungs, mildly increased. Representative 0.9 cm medial right middle lobe nodule (series 4/image 73), increased from 0.7 cm. Representative 0.7 cm anterior right lower lobe nodule (series 4/image 65), increased from 0.6 cm. Representative 0.4 cm superior segment left lower lobe nodule (series 4/image 55), previously 0.3 cm. Musculoskeletal: No aggressive appearing focal osseous lesions. Acute nondisplaced anterior left fifth, sixth, seventh and eighth rib fractures. Marked thoracic spondylosis. Bilateral shoulder arthroplasties, partially visualized. CT ABDOMEN PELVIS FINDINGS Hepatobiliary: Normal liver with no liver mass. Cholelithiasis. No biliary ductal dilatation. Pancreas: Normal, with no laceration, mass or duct dilation. Spleen: Normal size spleen.  No splenic mass. Adrenals/Urinary Tract: Normal adrenals. No hydronephrosis. No contour deforming renal masses. No renal stones. Normal bladder. Stomach/Bowel: Grossly normal stomach. Normal caliber small bowel with no small  bowel wall thickening. Diffusely dilated and thick-walled appendix (14 mm diameter) with prominent periappendiceal fat stranding adjacent to the mid to distal appendix, suggesting acute appendicitis. No appendicolith. No periappendiceal free air or measurable collection. Normal large bowel with no diverticulosis, large bowel wall thickening or pericolonic fat stranding. Vascular/Lymphatic: Atherosclerotic nonaneurysmal abdominal aorta. Porta hepatis, aortocaval, left para-aortic and bulky bilateral iliac lymphadenopathy is increased. Representative 2.1 cm porta hepatis node (series 2/image 50), previously 1.9 cm. Representative 1.7 cm left para-aortic node (series 2/image 74), previously 1.2 cm. Representative 4.0 cm left external iliac node (  series 2/image 99), previously 3.3 cm. Reproductive: Status post hysterectomy, with no abnormal findings at the vaginal cuff. No adnexal mass. Other: No pneumoperitoneum, ascites or focal fluid collection. Musculoskeletal: No aggressive appearing focal osseous lesions. No fracture in the abdomen or pelvis. Partially visualized fixation hardware in the proximal left femur. Marked lumbar spondylosis. IMPRESSION: 1. Acute nondisplaced anterior left fifth, sixth, seventh and eighth rib fractures. No pneumothorax. 2. No acute traumatic injury in the abdomen or pelvis. 3. CT findings suggestive of acute appendicitis. No free air or measurable abscess on this noncontrast scan. Surgical consultation advised. 4. Widespread lymphadenopathy, increased since 05/01/2018 CT as detailed, compatible with progression of lymphoma. 5. Numerous nonspecific chronic solid pulmonary nodules scattered throughout both lungs, mildly increased, also potentially due to lymphoma. 6. Stable mild cardiomegaly. Three-vessel coronary atherosclerosis. 7. Cholelithiasis. 8. Aortic Atherosclerosis (ICD10-I70.0). These results were called by telephone at the time of interpretation on 06/22/2020 at 11:20 am to  provider Lavonia Drafts, who verbally acknowledged these results. Electronically Signed   By: Ilona Sorrel M.D.   On: 06/22/2020 11:23   DG Chest Port 1 View  Result Date: 06/22/2020 CLINICAL DATA:  69 year old female with history of weakness for the past 3 weeks and dizziness for the past 24 hours. EXAM: PORTABLE CHEST 1 VIEW COMPARISON:  Chest x-ray 10/24/2012. FINDINGS: Lung volumes are low. No consolidative airspace disease. No pleural effusions. No pneumothorax. No pulmonary nodule or mass noted. Pulmonary vasculature and the cardiomediastinal silhouette are within normal limits. Atherosclerosis in the thoracic aorta. Status post bilateral shoulder arthroplasty. IMPRESSION: 1. Low lung volumes without radiographic evidence of acute cardiopulmonary disease. 2. Aortic atherosclerosis. Electronically Signed   By: Vinnie Langton M.D.   On: 06/22/2020 09:09      Assessment & Plan: Pt is a 69 y.o. female with a PMHX of CLL, diabetes mellitus type 2 with chronic kidney disease, chronic kidney disease stage IIIb, hypertension, was admitted to [facility] on 06/22/2020 with acute kidney injury.  1.  Acute kidney injury/chronic kidney disease stage IIIb baseline EGFR 30/diabetes mellitus type 2 with chronic kidney disease.  Patient was having symptoms of dizziness at home.  This suggest underlying volume depletion.  Agree with holding benazepril/HCTZ.  Continue IV fluid hydration as creatinine down to 2.7.  Patient currently on D5 normal saline at 100 cc/h.  Patient followed by Dr. Hollie Salk as an outpatient.  2.  Anemia of chronic kidney disease.  Hemoglobin low at 9.8.  Continue to monitor during her inpatient stay and will likely need outpatient monitoring.  Consider Epogen as an outpatient.

## 2020-06-23 NOTE — Consult Note (Signed)
Hematology/Oncology Consult note [facility] Telephone:([phone] [phone]  Patient Care Team: Ronnald Nian, DO as PCP - General (Family Medicine) Minna Merritts, MD as PCP - Cardiology (Cardiology) Minna Merritts, MD as Consulting Physician (Cardiology)   Name of the patient: Brenda [name]  [id number]  1951-10-16   Date of visit: 06/23/20 REASON FOR COSULTATION:  CLL  History of presenting illness-  69 y.o. female with PMH including Rai stage I CLL, CKD, hypertension, hyperlipidemia, GERD, history of endometrial cancer, diabetes, depression, arthritis anxiety who presents to ER for evaluation of weakness and dizziness.  She also had a fall landing on her left side.  Patient was not able to get up and had to call neighbor to assist her to get up.  She feels dizzy and lightheaded and came to emergency room via EMS.  Patient had initial blood pressure of 88/40, heart rate 78, pulse oximetry 98%.  Denies any fever, unintentional weight loss, night sweating.  Lab findings are significant for acute worsening of kidney function with creatinine increased to 4.63.  Her baseline creatinine is about 1.6.  Leukocytosis Patient had chest abdomen pelvis without contrast which showed acute nondisplaced anterior left fifth, sixth, seventh and eighth rib fracture.  CT findings suggestive of appendicitis .  Widespread lymphadenopathy, increased since 05/01/2018.  Compatible worsening of her underlying CLL. Numerous nonspecific chronic solid lung nodules, mildly increased.  Stable cardiomegaly three-vessel CAD cholelithiasis.  Heme-onc was consulted for abnormal CT findings with progressive lymphadenopathy. Patient has a known RAI Stage I CLL history and follows up with Dr. Irene Limbo.  Patient was seen by oncology on 05/12/2020.  She has had axillary lymph node biopsy done in the past which confirmed CLL.Marland Kitchen Her LDH was within normal limit at that time.  She has  normal total white blood cell count.  Minimal anemia and thrombocytopenia.  Watchful waiting was recommended at that time.  Patient feels that she was in her usual state of health until recently with the symptoms.  She also has had loose bowel movements for the past week prior to the onset of symptoms.  She denies any shortness of breath, abdominal pain, unintentional weight loss, night sweating or fever.  Denies any cough.  Review of Systems  Constitutional: Positive for fatigue. Negative for appetite change, chills, fever and unexpected weight change.  HENT:   Negative for hearing loss and voice change.   Eyes: Negative for eye problems.  Respiratory: Negative for chest tightness and cough.   Cardiovascular: Negative for chest pain.  Gastrointestinal: Positive for diarrhea. Negative for abdominal distention, abdominal pain and blood in stool.  Endocrine: Negative for hot flashes.  Genitourinary: Negative for difficulty urinating and frequency.   Musculoskeletal: Negative for arthralgias.  Skin: Negative for itching and rash.  Neurological: Positive for light-headedness. Negative for extremity weakness.  Hematological: Negative for adenopathy.  Psychiatric/Behavioral: Negative for confusion.    Allergies  Allergen Reactions  . Amlodipine     Swelling    . Nsaids     Due to kidney function    Patient Active Problem List   Diagnosis Date Noted  . AKI (acute kidney injury) ([place]) 06/22/2020  . Fall at home, initial encounter 06/22/2020  . Osteopenia 05/22/2020  . Status post total shoulder arthroplasty, left 01/10/2020  . Well woman exam without gynecological exam 05/02/2019  . SCC (squamous cell carcinoma), hand, right 05/02/2019  . Advice given about COVID-19 virus infection 05/02/2019  . CKD (chronic kidney disease)  stage 3, GFR 30-59 ml/min 07/05/2018  . CLL (chronic lymphocytic leukemia) ([place]) 03/13/2018  . Anxiety 03/13/2018  . Bilateral edema of lower extremity 01/18/2018   . Pleural effusion, bilateral 12/19/2017  . Acute on chronic renal failure ([place]) 11/28/2017  . Hypokalemia 11/28/2017  . History of endometrial cancer 11/18/2017  . Pelvic lymphadenopathy 11/18/2017  . Microcytic anemia 11/18/2017  . Daytime sleepiness 08/22/2017  . Medication side effect, initial encounter 08/22/2017  . Coronary artery disease of native artery of native heart with stable angina pectoris ([place]) 04/03/2017  . S/P shoulder replacement, right 08/19/2016  . Type 2 diabetes mellitus with hyperglycemia, without long-term current use of insulin ([place]) 10/15/2015  . Obesity 01/24/2015  . Generalized anxiety disorder 01/02/2015  . Chronic radicular low back pain 04/01/2014  . Tachycardia 01/31/2013  . Hip fracture, left ([place]) 10/24/2012  . Postop Hyponatremia 12/21/2011  . OA (osteoarthritis) 09/20/2011  . Mixed hyperlipidemia 11/06/2010  . Depression 11/06/2010  . Essential hypertension 11/06/2010  . GERD 11/06/2010     Past Medical History:  Diagnosis Date  . Anxiety   . Arthritis   . CLL (chronic lymphocytic leukemia) (HCC)    Dx. 2019 Dr. Irene Limbo'  . Depression    Wellbutrin  . Diabetes mellitus    Type II  . Endometrial ca ([place])    1998  . 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 GERD (gastroesophageal reflux disease)   . Heart murmur    patient states "cardiologist has not heard heart murmur for past several years"  . Heel spur   . History of hiatal hernia    small  . 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 Hyperkalemia   . Hyperlipidemia   . Hypertension   . Left leg swelling    "swelling in left leg from knee down when sitting for prolonged periods or being on feet for a long period of time"  . PONV (postoperative nausea and vomiting)    after hysterectomy  . Renal insufficiency    Stage 3 b   Dr. Particia Nearing first visit 11-2019      Past Surgical History:  Procedure Laterality Date  . ABDOMINAL HYSTERECTOMY  1998  . COLONOSCOPY    . EYE SURGERY Bilateral    cataracts  . EYE SURGERY Left    retina tear  . FEMUR IM NAIL  10/25/2012   Procedure: INTRAMEDULLARY (IM) NAIL FEMORAL;  Surgeon: Mauri Pole, MD;  Location: [facility];  Service: Orthopedics;  Laterality: Left;  . HIP SURGERY     Fracture L IM nail and 3 rods  . left knee arthroscopy  12/2010  . LYMPH NODE BIOPSY     axillary left 12-2018  . REFRACTIVE SURGERY    . skin cancer removed right and and lower forearm     squamoun in situ  . TOTAL KNEE ARTHROPLASTY  12/20/2011   Procedure: TOTAL KNEE ARTHROPLASTY;  Surgeon: Gearlean Alf, MD;  Location: [facility];  Service: Orthopedics;  Laterality: Left;  Failed attempt at spinal   . TOTAL SHOULDER ARTHROPLASTY Right 08/19/2016   Procedure: RIGHT TOTAL SHOULDER ARTHROPLASTY;  Surgeon: Justice Britain, MD;  Location: [facility];  Service: Orthopedics;  Laterality: Right;  . TOTAL SHOULDER ARTHROPLASTY Left 01/10/2020   Procedure: TOTAL SHOULDER ARTHROPLASTY;  Surgeon: Justice Britain, MD;  Location: [facility];  Service: Orthopedics;  Laterality: Left;  156mn    Social History   Socioeconomic History  . Marital status: Single    Spouse name: Not on file  . Number of children: Not on file  . Years of education: Not on file  . Highest education level: Not on file  Occupational History  . 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 Smoking status: Never Smoker  . Smokeless tobacco: Never Used  Vaping Use  . Vaping Use: Never used  Substance and Sexual Activity  . Alcohol use: No  . Drug use: No  . Sexual activity: Not Currently  Other Topics Concern  . Not on file  Social History Narrative   [facility]            Social Determinants of Health   Financial Resource Strain:   . Difficulty of Paying Living Expenses: Not on file  Food Insecurity:   . Worried  About RCharity fundraiserin the Last Year: Not on file  . Ran Out of Food in the Last Year: Not on file  Transportation Needs:   . Lack of Transportation (Medical): Not on file  . Lack of Transportation (Non-Medical): Not on file  Physical Activity:   . Days of Exercise per Week: Not on file  . Minutes of Exercise per Session: Not on file  Stress:   . Feeling of Stress : Not on file  Social Connections:   . Frequency of Communication with Friends and Family: Not on file  . Frequency of Social Gatherings with Friends and Family: Not on file  . Attends Religious Services: Not on file  . Active Member of Clubs or Organizations: Not on file  . Attends CArchivistMeetings: Not on file  . Marital Status: Not on file  Intimate Partner Violence:   . Fear of Current or Ex-Partner: Not on file  . Emotionally Abused: Not on file  . Physically Abused: Not on file  . Sexually Abused: Not on file     Family History  Problem Relation Age of Onset  . Cancer Mother        breast cancer  . Cancer Father        plasma sarcoma  . Breast cancer Neg Hx      Current Facility-Administered Medications:  .  acetaminophen (TYLENOL) tablet 500-1,000 mg, 500-1,000 mg, Oral, Q6H PRN, Agbata, Tochukwu, MD .  ALPRAZolam (Duanne Moron tablet 0.5 mg, 0.5 mg, Oral, BID PRN, Agbata, Tochukwu, MD, 0.5 mg at 06/22/20 1842 .  aspirin chewable tablet 81 mg, 81 mg, Oral, QODAY, Agbata, Tochukwu, MD, 81 mg at 06/22/20 1540 .  buPROPion (WELLBUTRIN XL) 24 hr tablet 300 mg, 300 mg, Oral, Daily, Agbata, Tochukwu, MD, 300 mg at 06/22/20 1541 .  cefTRIAXone (ROCEPHIN) 2 g in sodium chloride 0.9 % 100 mL IVPB, 2 g, Intravenous, Q24H, Agbata, Tochukwu, MD, Stopped at 06/22/20 1543 .  dextrose 5 %-0.9 % sodium chloride infusion, , Intravenous, Continuous, Agbata, Tochukwu, MD, Stopped at 06/23/20 0105 .  heparin injection 5,000 Units, 5,000 Units, Subcutaneous, Q8H, Agbata, Tochukwu, MD, 5,000 Units at 06/23/20 0536 .   insulin aspart (novoLOG) injection 0-15 Units, 0-15 Units, Subcutaneous, Q4H, Agbata, Tochukwu, MD .  iron polysaccharides (NIFEREX) capsule 150 mg, 150 mg, Oral, QHS, Agbata, Tochukwu, MD, 150 mg at 06/22/20 2228 .  lactated ringers infusion, , Intravenous, Continuous, DAthena Masse MD, Last Rate: 125 mL/hr  at 06/23/20 0332, New Bag at 06/23/20 0332 .  lidocaine (LIDODERM) 5 % 1 patch, 1 patch, Transdermal, Q24H, Agbata, Tochukwu, MD, 1 patch at 06/22/20 1419 .  metroNIDAZOLE (FLAGYL) IVPB 500 mg, 500 mg, Intravenous, Q8H, Agbata, Tochukwu, MD, Stopped at 06/23/20 0200 .  ondansetron (ZOFRAN) tablet 4 mg, 4 mg, Oral, Q6H PRN **OR** ondansetron (ZOFRAN) injection 4 mg, 4 mg, Intravenous, Q6H PRN, Agbata, Tochukwu, MD .  simvastatin (ZOCOR) tablet 40 mg, 40 mg, Oral, QHS, Agbata, Tochukwu, MD, 40 mg at 06/22/20 2227 .  triamcinolone cream (KENALOG) 0.1 % 1 application, 1 application, Topical, BID PRN, Agbata, Tochukwu, MD .  vitamin B-12 (CYANOCOBALAMIN) tablet 1,000 mcg, 1,000 mcg, Oral, QPM, Rowland Lathe, RPH, 1,000 mcg at 06/22/20 1752  Current Outpatient Medications:  .  acetaminophen (TYLENOL) 500 MG tablet, Take 500-1,000 mg by mouth every 6 (six) hours as needed for mild pain, moderate pain or fever. , Disp: , Rfl:  .  ALPRAZolam (XANAX) 1 MG tablet, Take 0.5-1 tablets (0.5-1 mg total) by mouth 2 (two) times daily as needed. for anxiety, Disp: 60 tablet, Rfl: 2 .  aspirin 81 MG tablet, Take 81 mg by mouth every other day. , Disp: , Rfl:  .  benazepril-hydrochlorthiazide (LOTENSIN HCT) 20-12.5 MG tablet, Take 1 tablet by mouth daily., Disp: 90 tablet, Rfl: 3 .  Blood Glucose Monitoring Suppl (ONETOUCH VERIO) w/Device KIT, Use to check blood sugar 3 times a day, Disp: 1 kit, Rfl: 0 .  buPROPion (WELLBUTRIN XL) 300 MG 24 hr tablet, Take 1 tablet (300 mg total) by mouth daily., Disp: 90 tablet, Rfl: 2 .  Cholecalciferol (VITAMIN D3) 2000 units TABS, Take 2,000 mcg by mouth daily., Disp: ,  Rfl:  .  Cyanocobalamin (B-12) 1000 MCG SUBL, Place 1,000 mcg under the tongue every evening. , Disp: , Rfl:  .  doxazosin (CARDURA) 2 MG tablet, TAKE 1 TABLET(2 MG) BY MOUTH DAILY (Patient taking differently: Take 2 mg by mouth daily. ), Disp: 30 tablet, Rfl: 4 .  esomeprazole (NEXIUM) 20 MG capsule, Take 20 mg by mouth daily as needed (acid reflux symptoms). , Disp: , Rfl:  .  ezetimibe (ZETIA) 10 MG tablet, Take 1 tablet (10 mg total) by mouth daily. (Patient taking differently: Take 100 mg by mouth every evening. ), Disp: 90 tablet, Rfl: 3 .  gabapentin (NEURONTIN) 300 MG capsule, Take 1 capsule (300 mg total) by mouth at bedtime., Disp: 90 capsule, Rfl: 3 .  glipiZIDE (GLUCOTROL XL) 2.5 MG 24 hr tablet, Take 2.5 mg by mouth 2 (two) times daily with a meal., Disp: , Rfl:  .  insulin glargine (LANTUS SOLOSTAR) 100 UNIT/ML Solostar Pen, Inject 22-24 Units into the skin at bedtime. (Patient taking differently: Inject 24 Units into the skin at bedtime. ), Disp: 18 mL, Rfl: 3 .  Insulin Pen Needle 32G X 4 MM MISC, Use 1x a day, Disp: 100 each, Rfl: 3 .  iron polysaccharides (NIFEREX) 150 MG capsule, Take 150 mg by mouth at bedtime. , Disp: , Rfl:  .  Lancet Devices (LANCING DEVICE) MISC, Use as advised - Freestyle, Disp: 1 each, Rfl: 0 .  metoprolol tartrate (LOPRESSOR) 25 MG tablet, TAKE 1 TABLET BY MOUTH ONCE DAILY (Patient taking differently: Take 25 mg by mouth daily. ), Disp: 90 tablet, Rfl: 0 .  NONFORMULARY OR COMPOUNDED ITEM, Apply 1 application topically 2 (two) times daily as needed (sun damage on face). FLUOROURACIL 5% + CALCIPOTRIENE 0.005%, Disp: , Rfl:  .  OneTouch Delica Lancets 00F MISC, 1 each by Does not apply route 3 (three) times daily. To check blood sugar 3 times a day, Disp: 300 each, Rfl: 11 .  simvastatin (ZOCOR) 40 MG tablet, Take 1 tablet (40 mg total) by mouth at bedtime., Disp: 90 tablet, Rfl: 0 .  triamcinolone cream (KENALOG) 0.1 %, Apply 1 application topically 2 (two)  times daily as needed (skin rash.). , Disp: , Rfl:  .  glucose blood (ONETOUCH VERIO) test strip, Use to check blood sugar 3 times a day., Disp: 300 each, Rfl: 12   Physical exam:  Vitals:   06/23/20 0430 06/23/20 0500 06/23/20 0600 06/23/20 0630  BP: (!) 100/51 (!) 108/55 (!) 104/56 (!) 97/33  Pulse: 99 (!) 103 (!) 102 (!) 101  Resp: 19 20 20 20   Temp:      TempSrc:      SpO2: 97% 99% 100% 100%  Weight:      Height:       Physical Exam      CMP Latest Ref Rng & Units 06/22/2020  Glucose 70 - 99 mg/dL 202(H)  BUN 8 - 23 mg/dL 67(H)  Creatinine 0.44 - 1.00 mg/dL 3.60(H)  Sodium 135 - 145 mmol/L 133(L)  Potassium 3.5 - 5.1 mmol/L 3.6  Chloride 98 - 111 mmol/L 105  CO2 22 - 32 mmol/L 15(L)  Calcium 8.9 - 10.3 mg/dL 7.2(L)  Total Protein 6.5 - 8.1 g/dL 5.2(L)  Total Bilirubin 0.3 - 1.2 mg/dL 0.9  Alkaline Phos 38 - 126 U/L 67  AST 15 - 41 U/L 24  ALT 0 - 44 U/L 12   CBC Latest Ref Rng & Units 06/22/2020  WBC 4.0 - 10.5 K/uL 13.7(H)  Hemoglobin 12.0 - 15.0 g/dL 9.7(L)  Hematocrit 36 - 46 % 28.8(L)  Platelets 150 - 400 K/uL 160    RADIOGRAPHIC STUDIES: I have personally reviewed the radiological images as listed and agreed with the findings in the report. CT ABDOMEN PELVIS WO CONTRAST  Result Date: 06/22/2020 CLINICAL DATA:  Fall with left flank and rib injury with bruising. Weakness and dizziness. EXAM: CT CHEST, ABDOMEN AND PELVIS WITHOUT CONTRAST TECHNIQUE: Multidetector CT imaging of the chest, abdomen and pelvis was performed following the standard protocol without IV contrast. COMPARISON:  Chest radiograph from earlier today. History of lymphoma. FINDINGS: CT CHEST FINDINGS Cardiovascular: Stable mild cardiomegaly. Stable trace pericardial effusion/thickening. Three-vessel coronary atherosclerosis. Atherosclerotic nonaneurysmal thoracic aorta. No acute intramural hematoma in the thoracic aorta. Normal caliber pulmonary arteries. Mediastinum/Nodes: No pneumomediastinum. No  mediastinal hematoma. No discrete thyroid nodules. Unremarkable esophagus. Moderate bilateral axillary/retropectoral lymphadenopathy, increased. Representative 1.9 cm left axillary node (series 2/image 10), increased from 1.2 cm on 05/01/2018 CT. Representative 1.9 cm right axillary node (series 2/image 18), increased from 1.5 cm. Enlarged 1.9 cm subcarinal node (series 2/image 25), increased from 1.5 cm. New mild bilateral paratracheal adenopathy up to 1.1 cm on the left (series 2/image 16). No discrete hilar adenopathy on these noncontrast images. Lungs/Pleura: No pneumothorax. No pleural effusion. No acute consolidative airspace disease or lung masses. Mild platelike atelectasis at the dependent lung bases bilaterally. Numerous (greater than 20) solid pulmonary nodules scattered throughout both lungs, mildly increased. Representative 0.9 cm medial right middle lobe nodule (series 4/image 73), increased from 0.7 cm. Representative 0.7 cm anterior right lower lobe nodule (series 4/image 65), increased from 0.6 cm. Representative 0.4 cm superior segment left lower lobe nodule (series 4/image 55), previously 0.3 cm. Musculoskeletal: No aggressive appearing focal osseous lesions.  Acute nondisplaced anterior left fifth, sixth, seventh and eighth rib fractures. Marked thoracic spondylosis. Bilateral shoulder arthroplasties, partially visualized. CT ABDOMEN PELVIS FINDINGS Hepatobiliary: Normal liver with no liver mass. Cholelithiasis. No biliary ductal dilatation. Pancreas: Normal, with no laceration, mass or duct dilation. Spleen: Normal size spleen.  No splenic mass. Adrenals/Urinary Tract: Normal adrenals. No hydronephrosis. No contour deforming renal masses. No renal stones. Normal bladder. Stomach/Bowel: Grossly normal stomach. Normal caliber small bowel with no small bowel wall thickening. Diffusely dilated and thick-walled appendix (14 mm diameter) with prominent periappendiceal fat stranding adjacent to the mid  to distal appendix, suggesting acute appendicitis. No appendicolith. No periappendiceal free air or measurable collection. Normal large bowel with no diverticulosis, large bowel wall thickening or pericolonic fat stranding. Vascular/Lymphatic: Atherosclerotic nonaneurysmal abdominal aorta. Porta hepatis, aortocaval, left para-aortic and bulky bilateral iliac lymphadenopathy is increased. Representative 2.1 cm porta hepatis node (series 2/image 50), previously 1.9 cm. Representative 1.7 cm left para-aortic node (series 2/image 74), previously 1.2 cm. Representative 4.0 cm left external iliac node (series 2/image 99), previously 3.3 cm. Reproductive: Status post hysterectomy, with no abnormal findings at the vaginal cuff. No adnexal mass. Other: No pneumoperitoneum, ascites or focal fluid collection. Musculoskeletal: No aggressive appearing focal osseous lesions. No fracture in the abdomen or pelvis. Partially visualized fixation hardware in the proximal left femur. Marked lumbar spondylosis. IMPRESSION: 1. Acute nondisplaced anterior left fifth, sixth, seventh and eighth rib fractures. No pneumothorax. 2. No acute traumatic injury in the abdomen or pelvis. 3. CT findings suggestive of acute appendicitis. No free air or measurable abscess on this noncontrast scan. Surgical consultation advised. 4. Widespread lymphadenopathy, increased since 05/01/2018 CT as detailed, compatible with progression of lymphoma. 5. Numerous nonspecific chronic solid pulmonary nodules scattered throughout both lungs, mildly increased, also potentially due to lymphoma. 6. Stable mild cardiomegaly. Three-vessel coronary atherosclerosis. 7. Cholelithiasis. 8. Aortic Atherosclerosis (ICD10-I70.0). These results were called by telephone at the time of interpretation on 06/22/2020 at 11:20 am to provider Lavonia Drafts, who verbally acknowledged these results. Electronically Signed   By: Ilona Sorrel M.D.   On: 06/22/2020 11:23   CT Chest Wo  Contrast  Result Date: 06/22/2020 CLINICAL DATA:  Fall with left flank and rib injury with bruising. Weakness and dizziness. EXAM: CT CHEST, ABDOMEN AND PELVIS WITHOUT CONTRAST TECHNIQUE: Multidetector CT imaging of the chest, abdomen and pelvis was performed following the standard protocol without IV contrast. COMPARISON:  Chest radiograph from earlier today. History of lymphoma. FINDINGS: CT CHEST FINDINGS Cardiovascular: Stable mild cardiomegaly. Stable trace pericardial effusion/thickening. Three-vessel coronary atherosclerosis. Atherosclerotic nonaneurysmal thoracic aorta. No acute intramural hematoma in the thoracic aorta. Normal caliber pulmonary arteries. Mediastinum/Nodes: No pneumomediastinum. No mediastinal hematoma. No discrete thyroid nodules. Unremarkable esophagus. Moderate bilateral axillary/retropectoral lymphadenopathy, increased. Representative 1.9 cm left axillary node (series 2/image 10), increased from 1.2 cm on 05/01/2018 CT. Representative 1.9 cm right axillary node (series 2/image 18), increased from 1.5 cm. Enlarged 1.9 cm subcarinal node (series 2/image 25), increased from 1.5 cm. New mild bilateral paratracheal adenopathy up to 1.1 cm on the left (series 2/image 16). No discrete hilar adenopathy on these noncontrast images. Lungs/Pleura: No pneumothorax. No pleural effusion. No acute consolidative airspace disease or lung masses. Mild platelike atelectasis at the dependent lung bases bilaterally. Numerous (greater than 20) solid pulmonary nodules scattered throughout both lungs, mildly increased. Representative 0.9 cm medial right middle lobe nodule (series 4/image 73), increased from 0.7 cm. Representative 0.7 cm anterior right lower lobe nodule (series 4/image 65), increased from  0.6 cm. Representative 0.4 cm superior segment left lower lobe nodule (series 4/image 55), previously 0.3 cm. Musculoskeletal: No aggressive appearing focal osseous lesions. Acute nondisplaced anterior left  fifth, sixth, seventh and eighth rib fractures. Marked thoracic spondylosis. Bilateral shoulder arthroplasties, partially visualized. CT ABDOMEN PELVIS FINDINGS Hepatobiliary: Normal liver with no liver mass. Cholelithiasis. No biliary ductal dilatation. Pancreas: Normal, with no laceration, mass or duct dilation. Spleen: Normal size spleen.  No splenic mass. Adrenals/Urinary Tract: Normal adrenals. No hydronephrosis. No contour deforming renal masses. No renal stones. Normal bladder. Stomach/Bowel: Grossly normal stomach. Normal caliber small bowel with no small bowel wall thickening. Diffusely dilated and thick-walled appendix (14 mm diameter) with prominent periappendiceal fat stranding adjacent to the mid to distal appendix, suggesting acute appendicitis. No appendicolith. No periappendiceal free air or measurable collection. Normal large bowel with no diverticulosis, large bowel wall thickening or pericolonic fat stranding. Vascular/Lymphatic: Atherosclerotic nonaneurysmal abdominal aorta. Porta hepatis, aortocaval, left para-aortic and bulky bilateral iliac lymphadenopathy is increased. Representative 2.1 cm porta hepatis node (series 2/image 50), previously 1.9 cm. Representative 1.7 cm left para-aortic node (series 2/image 74), previously 1.2 cm. Representative 4.0 cm left external iliac node (series 2/image 99), previously 3.3 cm. Reproductive: Status post hysterectomy, with no abnormal findings at the vaginal cuff. No adnexal mass. Other: No pneumoperitoneum, ascites or focal fluid collection. Musculoskeletal: No aggressive appearing focal osseous lesions. No fracture in the abdomen or pelvis. Partially visualized fixation hardware in the proximal left femur. Marked lumbar spondylosis. IMPRESSION: 1. Acute nondisplaced anterior left fifth, sixth, seventh and eighth rib fractures. No pneumothorax. 2. No acute traumatic injury in the abdomen or pelvis. 3. CT findings suggestive of acute appendicitis. No free  air or measurable abscess on this noncontrast scan. Surgical consultation advised. 4. Widespread lymphadenopathy, increased since 05/01/2018 CT as detailed, compatible with progression of lymphoma. 5. Numerous nonspecific chronic solid pulmonary nodules scattered throughout both lungs, mildly increased, also potentially due to lymphoma. 6. Stable mild cardiomegaly. Three-vessel coronary atherosclerosis. 7. Cholelithiasis. 8. Aortic Atherosclerosis (ICD10-I70.0). These results were called by telephone at the time of interpretation on 06/22/2020 at 11:20 am to provider Lavonia Drafts, who verbally acknowledged these results. Electronically Signed   By: Ilona Sorrel M.D.   On: 06/22/2020 11:23   DG Chest Port 1 View  Result Date: 06/22/2020 CLINICAL DATA:  69 year old female with history of weakness for the past 3 weeks and dizziness for the past 24 hours. EXAM: PORTABLE CHEST 1 VIEW COMPARISON:  Chest x-ray 10/24/2012. FINDINGS: Lung volumes are low. No consolidative airspace disease. No pleural effusions. No pneumothorax. No pulmonary nodule or mass noted. Pulmonary vasculature and the cardiomediastinal silhouette are within normal limits. Atherosclerosis in the thoracic aorta. Status post bilateral shoulder arthroplasty. IMPRESSION: 1. Low lung volumes without radiographic evidence of acute cardiopulmonary disease. 2. Aortic atherosclerosis. Electronically Signed   By: Vinnie Langton M.D.   On: 06/22/2020 09:09    Assessment and plan- Patient is a 69 y.o. female with known history of Rai stage I CLL, CKD, hypertension, hyperlipidemia, GERD, history of endometrial cancer, diabetes, depression, arthritis anxiety currently admitted due to fall and hypotension.  #CLL CT findings are reviewed and discussed with patient.  Consistent with progression of CLL comparing to CT that was done in 2019.  Patient also has an acute increase of total leukocytosis, which may be due to acute reactive process. LDH is 154,  remains stable comparing to July 2021. She denies any constitutional symptoms.  Her current symptoms appears to be  acute onset. She has worsening of anemia which may be secondary to chronic kidney disease, less likely progression of CLL.  She had minimal anemia in July 2021.  Continue watchful waiting for now while she is admitted for other medical problems.  Should follow up with Dr.Kale for further evaluation and follow-up of her counts.  If her counts are progressively worsening despite resolution of acute process, may need to consider starting treatment.  #Worsening of anemia, may be due to acute decrease of renal function. #AKI, due to volume depletion.  Nephrology on board.  Continue hydration, hold diuretics. #CT findings of appendicitis, patient has been evaluated by surgery.  Patient does not have abdominal symptoms.  Surgery recommend to manage conservatively.   Thank you for allowing me to participate in the care of this patient.  Earlie Server, MD, PhD Hematology Oncology [facility] Pager- [phone] 06/23/2020

## 2020-06-23 NOTE — ED Notes (Signed)
Patient given chocolate icecream

## 2020-06-23 NOTE — ED Notes (Signed)
NP ouma messaged about patients blood pressure

## 2020-06-23 NOTE — ED Notes (Signed)
attemtped to call report

## 2020-06-24 ENCOUNTER — Inpatient Hospital Stay: Payer: Medicare Other

## 2020-06-24 DIAGNOSIS — S2242XS Multiple fractures of ribs, left side, sequela: Secondary | ICD-10-CM

## 2020-06-24 DIAGNOSIS — R509 Fever, unspecified: Secondary | ICD-10-CM

## 2020-06-24 LAB — GLUCOSE, CAPILLARY
Glucose-Capillary: 83 mg/dL (ref 70–99)
Glucose-Capillary: 89 mg/dL (ref 70–99)
Glucose-Capillary: 102 mg/dL — ABNORMAL HIGH (ref 70–99)
Glucose-Capillary: 99 mg/dL (ref 70–99)
Glucose-Capillary: 93 mg/dL (ref 70–99)
Glucose-Capillary: 93 mg/dL (ref 70–99)

## 2020-06-24 LAB — GASTROINTESTINAL PANEL BY PCR, STOOL (REPLACES STOOL CULTURE)

## 2020-06-24 LAB — BASIC METABOLIC PANEL
Anion gap: 10 (ref 5–15)
BUN: 43 mg/dL — ABNORMAL HIGH (ref 8–23)
CO2: 21 mmol/L — ABNORMAL LOW (ref 22–32)
Calcium: 7.9 mg/dL — ABNORMAL LOW (ref 8.9–10.3)
Chloride: 106 mmol/L (ref 98–111)
Creatinine, Ser: 2.12 mg/dL — ABNORMAL HIGH (ref 0.44–1.00)
GFR calc Af Amer: 26 mL/min — ABNORMAL LOW (ref >60)
GFR calc non Af Amer: 23 mL/min — ABNORMAL LOW (ref >60)
Glucose, Bld: 97 mg/dL (ref 70–99)
Potassium: 4.1 mmol/L (ref 3.5–5.1)
Sodium: 137 mmol/L (ref 135–145)

## 2020-06-24 LAB — CBC WITH DIFFERENTIAL/PLATELET
Abs Immature Granulocytes: 0.08 10*3/uL — ABNORMAL HIGH (ref 0.00–0.07)
Basophils Absolute: 0.1 10*3/uL (ref 0.0–0.1)
Basophils Relative: 0 %
Eosinophils Absolute: 0 10*3/uL (ref 0.0–0.5)
Eosinophils Relative: 0 %
HCT: 31.7 % — ABNORMAL LOW (ref 36.0–46.0)
Hemoglobin: 10 g/dL — ABNORMAL LOW (ref 12.0–15.0)
Immature Granulocytes: 1 %
Lymphocytes Relative: 25 %
Lymphs Abs: 3.8 10*3/uL (ref 0.7–4.0)
MCH: 28.8 pg (ref 26.0–34.0)
MCHC: 31.5 g/dL (ref 30.0–36.0)
MCV: 91.4 fL (ref 80.0–100.0)
Monocytes Absolute: 0.9 10*3/uL (ref 0.1–1.0)
Monocytes Relative: 6 %
Neutro Abs: 10.6 10*3/uL — ABNORMAL HIGH (ref 1.7–7.7)
Neutrophils Relative %: 68 %
Platelets: 147 10*3/uL — ABNORMAL LOW (ref 150–400)
RBC: 3.47 MIL/uL — ABNORMAL LOW (ref 3.87–5.11)
RDW: 13.9 % (ref 11.5–15.5)
WBC: 15.4 10*3/uL — ABNORMAL HIGH (ref 4.0–10.5)
nRBC: 0 % (ref 0.0–0.2)

## 2020-06-24 LAB — CBC
HCT: 30.3 % — ABNORMAL LOW (ref 36.0–46.0)
Hemoglobin: 9.9 g/dL — ABNORMAL LOW (ref 12.0–15.0)
MCH: 28.9 pg (ref 26.0–34.0)
MCHC: 32.7 g/dL (ref 30.0–36.0)
MCV: 88.6 fL (ref 80.0–100.0)
Platelets: 141 10*3/uL — ABNORMAL LOW (ref 150–400)
RBC: 3.42 MIL/uL — ABNORMAL LOW (ref 3.87–5.11)
RDW: 13.6 % (ref 11.5–15.5)
WBC: 10.8 10*3/uL — ABNORMAL HIGH (ref 4.0–10.5)
nRBC: 0 % (ref 0.0–0.2)

## 2020-06-24 LAB — C DIFFICILE QUICK SCREEN W PCR REFLEX
C Diff antigen: NEGATIVE
C Diff interpretation: NOT DETECTED
C Diff toxin: NEGATIVE

## 2020-06-24 MED ORDER — HEPARIN SODIUM (PORCINE) 5000 UNIT/ML IJ SOLN
5000.0000 [IU] | Freq: Three times a day (TID) | INTRAMUSCULAR | Status: DC
  Administered 2020-06-24 – 2020-06-26 (×6): 5000 [IU] via SUBCUTANEOUS
  Filled 2020-06-24 (×6): qty 1

## 2020-06-24 MED ORDER — NYSTATIN 100000 UNIT/ML MT SUSP
5.0000 mL | Freq: Four times a day (QID) | OROMUCOSAL | Status: DC
  Administered 2020-06-24 (×3): 500000 [IU] via ORAL
  Filled 2020-06-24 (×6): qty 5

## 2020-06-24 MED ORDER — LOPERAMIDE HCL 2 MG PO CAPS
4.0000 mg | ORAL_CAPSULE | Freq: Three times a day (TID) | ORAL | Status: DC | PRN
  Administered 2020-06-24 – 2020-06-25 (×2): 4 mg via ORAL
  Filled 2020-06-24 (×2): qty 2

## 2020-06-24 NOTE — Progress Notes (Signed)
[facility] SURGICAL PROGRESS NOTE (cpt [phone])  Hospital Day(s): 2.   Interval History: Asked to follow up on this patient this morning by medicine service over concern for fever to 101.4 overnight, which improved with antipyretics. She continues to deny and abdominal pain, nausea, emesis this morning. She is having bowel function. Her only complaint of pain remains on her left chest wall over area of fractures. She has not had an incentive spirometer yet. Leukocytosis is improved this morning and mow 10.8K. renal function is also making gradual progress with sCr - 2.12. Tolerating soft diet.   Review of Systems:  Constitutional: denies fever, chills  HEENT: denies cough or congestion  Respiratory: denies any shortness of breath  Cardiovascular: + CP (Musculoskeletal) Gastrointestinal: denies abdominal pain, N/V, or diarrhea/and bowel function as per interval history Genitourinary: denies burning with urination or urinary frequency Musculoskeletal: denies pain, decreased motor or sensation  Vital signs in last 24 hours: [min-max] current  Temp:  [98.4 F (36.9 C)-101.4 F (38.6 C)] 98.4 F (36.9 C) (08/24 0914) Pulse Rate:  [69-115] 92 (08/24 0914) Resp:  [15-29] 18 (08/24 0914) BP: (80-133)/(42-67) 92/43 (08/24 0914) SpO2:  [92 %-98 %] 97 % (08/24 0914) Weight:  [89.2 kg-92.1 kg] 89.2 kg (08/24 0342)     Height: 4' 11"  (149.9 cm) Weight: 89.2 kg BMI (Calculated): 39.69   Intake/Output last 2 shifts:  08/23 0701 - 08/24 0700 In: 759 [IV Piggyback:759] Out: 800 [Urine:800]   Physical Exam:  Constitutional: alert, cooperative, appears uncomfortable, tearful HENT: normocephalic without obvious abnormality  Eyes: PERRL, EOM's grossly intact and symmetric  Respiratory: On [place], breathing non-labored at rest  Cardiovascular: tachycardic, sinus rhythm  Chest: Tenderness over left chest wall Gastrointestinal: soft, non-tender, and non-distended, no  rebound/guarding Musculoskeletal: no edema or wounds, motor and sensation grossly intact, NT    Labs:  CBC Latest Ref Rng & Units 06/23/2020 06/23/2020 06/22/2020  WBC 4.0 - 10.5 K/uL 15.4(H) 15.4(H) 13.7(H)  Hemoglobin 12.0 - 15.0 g/dL 10.0(L) 9.8(L) 9.7(L)  Hematocrit 36 - 46 % 31.7(L) 30.8(L) 28.8(L)  Platelets 150 - 400 K/uL 147(L) 147(L) 160   CMP Latest Ref Rng & Units 06/23/2020 06/22/2020 06/22/2020  Glucose 70 - 99 mg/dL 152(H) 202(H) 41(LL)  BUN 8 - 23 mg/dL 56(H) 67(H) 62(H)  Creatinine 0.44 - 1.00 mg/dL 2.70(H) 3.60(H) 3.05(H)  Sodium 135 - 145 mmol/L 138 133(L) 135  Potassium 3.5 - 5.1 mmol/L 4.5 3.6 3.1(L)  Chloride 98 - 111 mmol/L 108 105 107  CO2 22 - 32 mmol/L 22 15(L) 17(L)  Calcium 8.9 - 10.3 mg/dL 7.4(L) 7.2(L) 6.2(LL)  Total Protein 6.5 - 8.1 g/dL - 5.2(L) -  Total Bilirubin 0.3 - 1.2 mg/dL - 0.9 -  Alkaline Phos 38 - 126 U/L - 67 -  AST 15 - 41 U/L - 24 -  ALT 0 - 44 U/L - 12 -     Imaging studies:   CXR (06/24/2020) personally reviewed concerning for atelectasis, and radiologist report reviewed:  IMPRESSION: Mild bibasilar subsegmental atelectasis. Mild central pulmonary vascular congestion is noted.    Assessment/Plan: (ICD-10's: S53.42XA) 69 y.o. female with fall at home resulting in multiple left sided rib fractures, and I suspect her isolated fever is related to atelectasis from this, also incidentally found to have non-specific dilated appendix and is clinically without any signs of acute appendicitis, complicated by improving AKI and history of chronic lymphocytic   - She remains without any abdominal pain and I do not suspect acute  appendicitis. I suspect her isolated fever at this point is more likely contributed to atelectasis associated with multiple left sided rib fractures.   - Continue pulmonary tolieting for rib fractures; Incentive spirometer   - Pain control prn; added robaxin to try and aid in pain control   - Monitor abdominal  examination; leukocytosis   - If she develops additional fevers, abdominal pain, or has a worsening in her leukocytosis we can always repeat CT to reassess appendix   - Mobilization as tolerates             - further management per primary service; no general surgery needs at this time we will continue to be available as needed  All of the above findings and recommendations were discussed with the patient, and the medical team, and all of patient's questions were answered to her expressed satisfaction.  -- Edison Simon, PA-C [facility] 06/24/2020, 10:02 AM [phone] M-F: 7am - 4pm

## 2020-06-24 NOTE — Progress Notes (Addendum)
Physical Therapy Treatment Patient Details Name: Brenda [name] MRN: [id number] DOB: 09/16/1951 Today's Date: 06/24/2020    History of Present Illness 69 yo Female s/p fall at home with 4 left rib fractures. Patient has a PMH significant for CLL, DM, stage III CKD, HTN; She is being admitted to monitor kidney function.    PT Comments    Pt in bed, ready for session.  Unable to recall therapy session yesterday stating she had not been up since Saturday.  Reviewed past care with her.  She requires mod a in and out of bed.  She stated she has been sleeping and plans to return to sleeping in a recliner at home.  She is able to walk 60' x 2 in room with RW and min guard x 1.  Overall does well with no LOB or buckling.  She does report some fatigue and SOB but sats remain in high 90's on room air with mobility.  She attributes SOB to not being able to take a deep breath with pain from rib fx.  She does have a walker at home and agreed to use it upon discharge. Denies dizziness with gait today.  Discussed discharge plan at length. She voices being upset over SNF recommendation.  Given progress with mobility, adjusted to HHPT as she demonstrates safe household ambulation.  Education and discussion followed on why SNF was recommended initially but given progress today HHPT would be appropriate.   She stated she does have limited assistance at home and is generally weepy at times during session. Encouragement given.  She is encouraged to look into a life alert button system for safety and she agreed that would be a good idea but finances may be a barrier.  Discussed other options including activating Siri on her iphone and carrying that with her as when she fell this past week-end her phone was in the other room.  Discussed a carrying bag attached to walker as another option to keep phone close as she stated she did not want something around her neck.  She was receptive to suggestions but remained somber.  She  voices again she does not want to go to SNF.  Her friend did arrive during session and seemed supportive.   Follow Up Recommendations  Home health PT;Supervision - Intermittent     Equipment Recommendations  None recommended by PT    Recommendations for Other Services       Precautions / Restrictions Precautions Precautions: Fall Restrictions Weight Bearing Restrictions: No    Mobility  Bed Mobility Overal bed mobility: Needs Assistance Bed Mobility: Supine to Sit;Sit to Supine     Supine to sit: Mod assist Sit to supine: Mod assist   General bed mobility comments: requires cues for hand placement and positioning; Exhibits poor trunk control due to rib pain; Required assistance to lift legs into bed;  Stated she sleeps in reclienr at home since shoulder replacement  Transfers Overall transfer level: Needs assistance Equipment used: Rolling walker (2 wheeled) Transfers: Sit to/from Stand Sit to Stand: Min guard            Ambulation/Gait Ambulation/Gait assistance: Min guard Gait Distance (Feet): 60 Feet Assistive device: Rolling walker (2 wheeled)   Gait velocity: decreased   General Gait Details: 60' x 2 in room with seated rest and RW,  no LOB or buckling.  she did not use RW at baseline but has one at home and encouraged to use it.   Stairs  Wheelchair Mobility    Modified Rankin (Stroke Patients Only)       Balance Overall balance assessment: Needs assistance Sitting-balance support: Bilateral upper extremity supported;Feet supported Sitting balance-Leahy Scale: Good     Standing balance support: Bilateral upper extremity supported Standing balance-Leahy Scale: Fair Standing balance comment: steady today but uses walke for balance                            Cognition Arousal/Alertness: Awake/alert Behavior During Therapy: WFL for tasks assessed/performed Overall Cognitive Status: Within Functional Limits for  tasks assessed                                        Exercises      General Comments        Pertinent Vitals/Pain Pain Assessment: Faces Faces Pain Scale: Hurts a little bit Pain Location: rib pain at rest Pain Descriptors / Indicators: Aching;Sore Pain Intervention(s): Limited activity within patient's tolerance;Monitored during session    Home Living                      Prior Function            PT Goals (current goals can now be found in the care plan section) Progress towards PT goals: Progressing toward goals    Frequency    Min 2X/week      PT Plan      Co-evaluation              AM-PAC PT "6 Clicks" Mobility   Outcome Measure  Help needed turning from your back to your side while in a flat bed without using bedrails?: A Lot Help needed moving from lying on your back to sitting on the side of a flat bed without using bedrails?: A Lot Help needed moving to and from a bed to a chair (including a wheelchair)?: None Help needed standing up from a chair using your arms (e.g., wheelchair or bedside chair)?: None Help needed to walk in hospital room?: A Little Help needed climbing 3-5 steps with a railing? : A Lot 6 Click Score: 17    End of Session Equipment Utilized During Treatment: Gait belt Activity Tolerance: Patient limited by pain Patient left: in bed;with call bell/phone within reach Nurse Communication: Mobility status PT Visit Diagnosis: Repeated falls (R29.6);Muscle weakness (generalized) (M62.81)     Time: 3976-7341 PT Time Calculation (min) (ACUTE ONLY): 29 min  Charges:  $Gait Training: 23-37 mins                    Chesley Noon, PTA 06/24/20, 2:06 PM

## 2020-06-24 NOTE — Progress Notes (Signed)
[facility]  ROUNDING NOTE   Subjective:  Patient found resting in bed, expresses worries about possible rehab placement. Denies worsening SOB,dizziness,nausea,or vomiting.  Objective:  Vital signs in last 24 hours:  Temp:  [97.8 F (36.6 C)-101.4 F (38.6 C)] 97.8 F (36.6 C) (08/24 1134) Pulse Rate:  [74-115] 95 (08/24 1134) Resp:  [15-29] 19 (08/24 1134) BP: (80-133)/(43-67) 115/58 (08/24 1134) SpO2:  [92 %-97 %] 95 % (08/24 1134) Weight:  [89.2 kg-92.1 kg] 89.2 kg (08/24 0342)  Weight change: 8.664 kg Filed Weights   06/22/20 0834 06/23/20 2155 06/24/20 0342  Weight: 83.5 kg 92.1 kg 89.2 kg    Intake/Output: I/O last 3 completed shifts: In: 809 [IV YYTKPTWSF:681] Out: 800 [Urine:800]   Intake/Output this shift:  No intake/output data recorded.  Physical Exam: General:  In tears when talking about possible disposition  Head:  Normocephalic, atraumatic, Scab noted on the lip  Eyes:  Anicteric  Neck:  Supple  Lungs:   Clear to auscultation, normal effort  Heart:  S1S2 no rubs  Abdomen:   Soft, nontender, bowel sounds present  Extremities:  No peripheral edema.  Neurologic:  Awake, alert, following commands  Skin:  No lesions,scabs noted on Rt side of the face and lip        Basic Metabolic Panel: Recent Labs  Lab 06/22/20 0845 06/22/20 0845 06/22/20 1622 06/22/20 1622 06/22/20 2330 06/23/20 0919 06/24/20 1006  NA 132*  --  135  --  133* 138 137  K 3.8  --  3.1*  --  3.6 4.5 4.1  CL 98  --  107  --  105 108 106  CO2 19*  --  17*  --  15* 22 21*  GLUCOSE 90  --  41*  --  202* 152* 97  BUN 77*  --  62*  --  67* 56* 43*  CREATININE 4.63*  --  3.05*  --  3.60* 2.70* 2.12*  CALCIUM 7.8*   < > 6.2*   < > 7.2* 7.4* 7.9*   < > = values in this interval not displayed.    Liver Function Tests: Recent Labs  Lab 06/22/20 0845 06/22/20 2330  AST 15 24  ALT 12 12  ALKPHOS 71 67  BILITOT 0.9 0.9  PROT 5.8* 5.2*  ALBUMIN 3.4* 3.0*   No  results for input(s): LIPASE, AMYLASE in the last 168 hours. No results for input(s): AMMONIA in the last 168 hours.  CBC: Recent Labs  Lab 06/22/20 0845 06/22/20 2330 06/23/20 0919 06/24/20 1006  WBC 15.4* 13.7* 15.4*  15.4* 10.8*  NEUTROABS  --   --  10.6*  --   HGB 10.9* 9.7* 10.0*  9.8* 9.9*  HCT 33.8* 28.8* 31.7*  30.8* 30.3*  MCV 88.9 87.0 91.4  90.9 88.6  PLT 160 160 147*  147* 141*    Cardiac Enzymes: Recent Labs  Lab 06/22/20 0845  CKTOTAL 209    BNP: Invalid input(s): POCBNP  CBG: Recent Labs  Lab 06/23/20 2111 06/24/20 0102 06/24/20 0344 06/24/20 0912 06/24/20 1133  GLUCAP 132* 83 89 102* 6    Microbiology: Results for orders placed or performed during the hospital encounter of 06/22/20  SARS Coronavirus 2 by RT PCR (hospital order, performed in [facility] hospital lab) Nasopharyngeal Nasopharyngeal Swab     Status: None   Collection Time: 06/22/20 11:44 AM   Specimen: Nasopharyngeal Swab  Result Value Ref Range Status   SARS Coronavirus 2 NEGATIVE NEGATIVE Final  Comment: (NOTE) SARS-CoV-2 target nucleic acids are NOT DETECTED.  The SARS-CoV-2 RNA is generally detectable in upper and lower respiratory specimens during the acute phase of infection. The lowest concentration of SARS-CoV-2 viral copies this assay can detect is 250 copies / mL. A negative result does not preclude SARS-CoV-2 infection and should not be used as the sole basis for treatment or other patient management decisions.  A negative result may occur with improper specimen collection / handling, submission of specimen other than nasopharyngeal swab, presence of viral mutation(s) within the areas targeted by this assay, and inadequate number of viral copies (<250 copies / mL). A negative result must be combined with clinical observations, patient history, and epidemiological information.  Fact Sheet for Patients:   StrictlyIdeas.no  Fact Sheet  for Healthcare Providers: BankingDealers.co.za  This test is not yet approved or  cleared by the [place] FDA and has been authorized for detection and/or diagnosis of SARS-CoV-2 by FDA under an Emergency Use Authorization (EUA).  This EUA will remain in effect (meaning this test can be used) for the duration of the COVID-19 declaration under Section 564(b)(1) of the Act, 21 U.S.C. section 360bbb-3(b)(1), unless the authorization is terminated or revoked sooner.  Performed at [facility], [address] [place], [place] [address]   Culture, blood (x 2)     Status: None (Preliminary result)   Collection Time: 06/23/20  1:35 AM   Specimen: BLOOD  Result Value Ref Range Status   Specimen Description BLOOD BLOOD RIGHT HAND  Final   Special Requests   Final    BOTTLES DRAWN AEROBIC AND ANAEROBIC Blood Culture adequate volume   Culture   Final    NO GROWTH 1 DAY Performed at [facility], [address], [place], [place] [address]    Report Status PENDING  Incomplete  Culture, blood (x 2)     Status: None (Preliminary result)   Collection Time: 06/23/20  2:30 AM   Specimen: BLOOD  Result Value Ref Range Status   Specimen Description BLOOD BLOOD RIGHT HAND  Final   Special Requests   Final    BOTTLES DRAWN AEROBIC AND ANAEROBIC Blood Culture adequate volume   Culture   Final    NO GROWTH 1 DAY Performed at [facility], [address] [place], [place] [address]    Report Status PENDING  Incomplete    Coagulation Studies: Recent Labs    06/23/20 0229  LABPROT 14.7  INR 1.2    Urinalysis: Recent Labs    06/22/20 1144  COLORURINE YELLOW*  LABSPEC 1.014  PHURINE 5.0  GLUCOSEU NEGATIVE  HGBUR MODERATE*  BILIRUBINUR NEGATIVE  KETONESUR NEGATIVE  PROTEINUR NEGATIVE  NITRITE NEGATIVE  LEUKOCYTESUR NEGATIVE      Imaging: DG Chest Port 1 View  Result Date: 06/24/2020 CLINICAL DATA:  Chest pain, fever. EXAM:  PORTABLE CHEST 1 VIEW COMPARISON:  June 22, 2020. FINDINGS: Stable cardiomediastinal silhouette. Mild central pulmonary vascular congestion is noted. No pneumothorax or pleural effusion is noted. Mild bibasilar subsegmental atelectasis is noted. Status post bilateral shoulder arthroplasties. IMPRESSION: Mild bibasilar subsegmental atelectasis. Mild central pulmonary vascular congestion is noted. Aortic Atherosclerosis (ICD10-I70.0). Electronically Signed   By: Marijo Conception M.D.   On: 06/24/2020 08:44     Medications:   . cefTRIAXone (ROCEPHIN)  IV Stopped (06/23/20 1705)  . lactated ringers 75 mL/hr at 06/24/20 1318  . metronidazole 500 mg (06/24/20 0958)   . aspirin  81 mg Oral QODAY  .  buPROPion  300 mg Oral Daily  . heparin injection (subcutaneous)  5,000 Units Subcutaneous Q8H  . insulin aspart  0-15 Units Subcutaneous Q4H  . iron polysaccharides  150 mg Oral QHS  . lidocaine  1 patch Transdermal Q24H  . midodrine  5 mg Oral TID WC  . nystatin  5 mL Oral QID  . simvastatin  40 mg Oral QHS  . vitamin B-12  1,000 mcg Oral QPM   acetaminophen, ALPRAZolam, methocarbamol, ondansetron **OR** ondansetron (ZOFRAN) IV, oxyCODONE, triamcinolone cream  Assessment/ Plan:  69 y.o. female with medical problems ofCLL, diabetes, CKD, who was admitted to [facility] on8/22/2021for evaluation of AKI (acute kidney injury) ([place]) [N17.9].  #AKI/chronic kidney disease stage IIIb baseline creatinine 1.6, EGFR 30 #Volume depletion #Generalized weakness Work-up in the emergency room showed increased creatinine of 4.63. Creatinine improved to 2.7 today with IV fluids. Continue IV fluids, monitor renal function daily, avoid nephrotoxins as possible.  Continue to hold benazepril/HCTZ. Disposition plan  as per primary team    LOS: 2 Ewell Benassi 8/24/20212:15 PM

## 2020-06-24 NOTE — Progress Notes (Signed)
Patient MEW score 4. Temp:101.4/HR:114/Resp:22 Gave pt 1081m Tylenol for fever/pain. Will re-assess v/s for decrease. If med not effective, will contact on-call provider.

## 2020-06-24 NOTE — Progress Notes (Signed)
Patient ID: Brenda [name], female   DOB: [birth year], 69 y.o.   MRN: [id number] [facility] PROGRESS NOTE  Brenda [name] XBJ:[id number] DOB: Jun 11, 1951 DOA: 06/22/2020 PCP: Ronnald Nian, DO  HPI/Subjective: Patient had a fever this morning.  Was admitted with fall and rib fractures.  Patient states that she has difficulty taking a deep breath.  Can hardly move secondary to rib fractures.  Had some diarrhea this afternoon.  Objective: Vitals:   06/24/20 1134 06/24/20 1621  BP: (!) 115/58 (!) 123/59  Pulse: 95 90  Resp: 19 19  Temp: 97.8 F (36.6 C) 98.2 F (36.8 C)  SpO2: 95% 97%    Intake/Output Summary (Last 24 hours) at 06/24/2020 1745 Last data filed at 06/24/2020 0440 Gross per 24 hour  Intake 500 ml  Output 800 ml  Net -300 ml   Filed Weights   06/22/20 0834 06/23/20 2155 06/24/20 0342  Weight: 83.5 kg 92.1 kg 89.2 kg    ROS: Review of Systems  Respiratory: Positive for cough and shortness of breath.   Cardiovascular: Positive for chest pain.  Gastrointestinal: Positive for diarrhea. Negative for abdominal pain, nausea and vomiting.  Musculoskeletal: Positive for joint pain.   Exam: Physical Exam HENT:     Nose: No mucosal edema.     Mouth/Throat:     Pharynx: No oropharyngeal exudate.  Eyes:     General: Lids are normal.     Conjunctiva/sclera: Conjunctivae normal.     Pupils: Pupils are equal, round, and reactive to light.  Cardiovascular:     Rate and Rhythm: Normal rate and regular rhythm.     Heart sounds: Normal heart sounds, S1 normal and S2 normal.  Pulmonary:     Breath sounds: Examination of the right-lower field reveals decreased breath sounds. Examination of the left-lower field reveals decreased breath sounds. Decreased breath sounds present. No wheezing, rhonchi or rales.  Abdominal:     Palpations: Abdomen is soft.     Tenderness: There is abdominal tenderness in the right lower quadrant.  Musculoskeletal:     Left forearm:  Swelling present.     Right foot: No swelling.     Left foot: No swelling.  Skin:    General: Skin is warm.     Findings: No rash.  Neurological:     Mental Status: She is alert and oriented to person, place, and time.       Data Reviewed: Basic Metabolic Panel: Recent Labs  Lab 06/22/20 0845 06/22/20 1622 06/22/20 2330 06/23/20 0919 06/24/20 1006  NA 132* 135 133* 138 137  K 3.8 3.1* 3.6 4.5 4.1  CL 98 107 105 108 106  CO2 19* 17* 15* 22 21*  GLUCOSE 90 41* 202* 152* 97  BUN 77* 62* 67* 56* 43*  CREATININE 4.63* 3.05* 3.60* 2.70* 2.12*  CALCIUM 7.8* 6.2* 7.2* 7.4* 7.9*   Liver Function Tests: Recent Labs  Lab 06/22/20 0845 06/22/20 2330  AST 15 24  ALT 12 12  ALKPHOS 71 67  BILITOT 0.9 0.9  PROT 5.8* 5.2*  ALBUMIN 3.4* 3.0*   CBC: Recent Labs  Lab 06/22/20 0845 06/22/20 2330 06/23/20 0919 06/24/20 1006  WBC 15.4* 13.7* 15.4*  15.4* 10.8*  NEUTROABS  --   --  10.6*  --   HGB 10.9* 9.7* 10.0*  9.8* 9.9*  HCT 33.8* 28.8* 31.7*  30.8* 30.3*  MCV 88.9 87.0 91.4  90.9 88.6  PLT 160 160 147*  147* 141*   Cardiac Enzymes:  Recent Labs  Lab 06/22/20 0845  CKTOTAL 209    CBG: Recent Labs  Lab 06/24/20 0102 06/24/20 0344 06/24/20 0912 06/24/20 1133 06/24/20 1620  GLUCAP 83 89 102* 99 93    Recent Results (from the past 240 hour(s))  SARS Coronavirus 2 by RT PCR (hospital order, performed in [facility] hospital lab) Nasopharyngeal Nasopharyngeal Swab     Status: None   Collection Time: 06/22/20 11:44 AM   Specimen: Nasopharyngeal Swab  Result Value Ref Range Status   SARS Coronavirus 2 NEGATIVE NEGATIVE Final    Comment: (NOTE) SARS-CoV-2 target nucleic acids are NOT DETECTED.  The SARS-CoV-2 RNA is generally detectable in upper and lower respiratory specimens during the acute phase of infection. The lowest concentration of SARS-CoV-2 viral copies this assay can detect is 250 copies / mL. A negative result does not preclude SARS-CoV-2  infection and should not be used as the sole basis for treatment or other patient management decisions.  A negative result may occur with improper specimen collection / handling, submission of specimen other than nasopharyngeal swab, presence of viral mutation(s) within the areas targeted by this assay, and inadequate number of viral copies (<250 copies / mL). A negative result must be combined with clinical observations, patient history, and epidemiological information.  Fact Sheet for Patients:   StrictlyIdeas.no  Fact Sheet for Healthcare Providers: BankingDealers.co.za  This test is not yet approved or  cleared by the [place] FDA and has been authorized for detection and/or diagnosis of SARS-CoV-2 by FDA under an Emergency Use Authorization (EUA).  This EUA will remain in effect (meaning this test can be used) for the duration of the COVID-19 declaration under Section 564(b)(1) of the Act, 21 U.S.C. section 360bbb-3(b)(1), unless the authorization is terminated or revoked sooner.  Performed at [facility], [address] [place], [place] [address]   Culture, blood (x 2)     Status: None (Preliminary result)   Collection Time: 06/23/20  1:35 AM   Specimen: BLOOD  Result Value Ref Range Status   Specimen Description BLOOD BLOOD RIGHT HAND  Final   Special Requests   Final    BOTTLES DRAWN AEROBIC AND ANAEROBIC Blood Culture adequate volume   Culture   Final    NO GROWTH 1 DAY Performed at [facility], [address] [place], [place] [address]    Report Status PENDING  Incomplete  Culture, blood (x 2)     Status: None (Preliminary result)   Collection Time: 06/23/20  2:30 AM   Specimen: BLOOD  Result Value Ref Range Status   Specimen Description BLOOD BLOOD RIGHT HAND  Final   Special Requests   Final    BOTTLES DRAWN AEROBIC AND ANAEROBIC Blood Culture adequate volume   Culture   Final    NO  GROWTH 1 DAY Performed at [facility], [address] [place], [place] [address]    Report Status PENDING  Incomplete  C Difficile Quick Screen w PCR reflex     Status: None   Collection Time: 06/24/20  4:25 PM   Specimen: STOOL  Result Value Ref Range Status   C Diff antigen NEGATIVE NEGATIVE Final   C Diff toxin NEGATIVE NEGATIVE Final   C Diff interpretation No C. difficile detected.  Final    Comment: Performed at [facility], [address] [place], [place] [address]     Studies: [facility] Chest Anthem 1 View  Result Date: 06/24/2020 CLINICAL DATA:  Chest  pain, fever. EXAM: PORTABLE CHEST 1 VIEW COMPARISON:  June 22, 2020. FINDINGS: Stable cardiomediastinal silhouette. Mild central pulmonary vascular congestion is noted. No pneumothorax or pleural effusion is noted. Mild bibasilar subsegmental atelectasis is noted. Status post bilateral shoulder arthroplasties. IMPRESSION: Mild bibasilar subsegmental atelectasis. Mild central pulmonary vascular congestion is noted. Aortic Atherosclerosis (ICD10-I70.0). Electronically Signed   By: Marijo Conception M.D.   On: 06/24/2020 08:44    Scheduled Meds: . aspirin  81 mg Oral QODAY  . buPROPion  300 mg Oral Daily  . heparin injection (subcutaneous)  5,000 Units Subcutaneous Q8H  . insulin aspart  0-15 Units Subcutaneous Q4H  . iron polysaccharides  150 mg Oral QHS  . lidocaine  1 patch Transdermal Q24H  . midodrine  5 mg Oral TID WC  . nystatin  5 mL Oral QID  . simvastatin  40 mg Oral QHS  . vitamin B-12  1,000 mcg Oral QPM   Continuous Infusions: . cefTRIAXone (ROCEPHIN)  IV 2 g (06/24/20 1720)  . lactated ringers 75 mL/hr at 06/24/20 1318  . metronidazole 500 mg (06/24/20 0958)    Assessment/Plan:  1. Fever of 101.  So far cultures are negative.  Stool for C. difficile negative.  Patient on Rocephin and Flagyl but not quite sure what I am treating at this point.  Repeat chest x-ray does not show aspiration.  Had  surgeons see again and they still do not think this is appendicitis.  Stool comprehensive panel pending.  Left upper extremity swelling we will do an ultrasound to rule out DVT. 2. Acute kidney injury.  Creatinine 4.63 on presentation down to 2.12.  Decrease rate of IV fluids. 3. Hypotension has resolved.  Continue to hold antihypertensive medications for right now.  Discontinue midodrine 4. Acute hypoxic respiratory failure.  This has resolved.  Now on room air. 5. 3 fractures on the left side after fall.  Pain control with oral oxycodone.  Incentive spirometry.  Physical therapy recommends rehab but patient would like to go home. 6. 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  Patient would like to follow-up with her oncologist Dr. Velvet Bathe as outpatient. 7. Depression and anxiety on Wellbutrin 8. Hyperlipidemia unspecified on simvastatin      Code Status:     Code Status Orders  (From admission, onward)         Start     Ordered   06/23/20 2230  Full code  Continuous        06/23/20 2229        Code Status History    Date Active Date Inactive Code Status Order ID Comments User Context   06/22/2020 1541 06/23/2020 2229 DNR [id number]  Collier Bullock, MD ED   06/22/2020 1353 06/22/2020 1541 Full Code [id number]  Collier Bullock, MD ED   01/10/2020 1101 01/11/2020 1609 Full Code [id number]  [name], PA-C Inpatient   11/18/2017 0100 11/21/2017 1928 Full Code [id number]  Vianne Bulls, MD ED   08/19/2016 1542 08/20/2016 1521 Full Code [id number]  Marcellus Scott Inpatient   10/25/2012 1646 10/27/2012 1816 Full Code [id number]  Diamantina Monks, RN Inpatient   10/24/2012 2330 10/25/2012 1646 Full Code [id number]  Franco Collet, RN Inpatient   12/20/2011 1136 12/23/2011 1847 Full Code [id number]  Wynelle Bourgeois, RN Inpatient   Advance Care Planning Activity      Disposition Plan: Status is: Inpatient  Dispo: The patient is from: Home  Anticipated d/c is  to: Home              Anticipated d/c date is: We will need a few days here in the hospital to follow-up kidney function, blood pressure and watch fever              Patient currently being treated for acute kidney injury and now has a fever.  Consultants:  Nephrology  Oncology  General surgery  Antibiotics:  Flagyl  Rocephin  Time spent: 28 minutes  [place]

## 2020-06-24 NOTE — Progress Notes (Addendum)
Hematology/Oncology Progress Note [facility] [phone] [phone]  Patient Care Team: Ronnald Nian, DO as PCP - General (Family Medicine) Minna Merritts, MD as PCP - Cardiology (Cardiology) Minna Merritts, MD as Consulting Physician (Cardiology)   Name of the patient: Brenda [name]  [id number]  19-Jul-1951  Date of visit: 06/24/20   INTERVAL HISTORY-  Patient feels better today.  She is concerned about left upper extremity swelling which she feels that her left upper extremity IV may have been infiltrated.  No tenderness or erythema changes. Kidney function is improving.  Review of systems- Review of Systems  Constitutional: Negative for chills, fatigue and fever.  Eyes: Negative for eye problems.  Respiratory: Negative for chest tightness and cough.   Cardiovascular: Negative for chest pain.  Gastrointestinal: Negative for abdominal distention, abdominal pain and blood in stool.  Endocrine: Negative for hot flashes.  Neurological: Negative for extremity weakness.  Hematological: Negative for adenopathy. Does not bruise/bleed easily.  Psychiatric/Behavioral: Negative for confusion.    Allergies  Allergen Reactions  . Amlodipine     Swelling    . Nsaids     Due to kidney function    Patient Active Problem List   Diagnosis Date Noted  . Acute respiratory failure with hypoxia ([place])   . Multiple closed fractures of ribs of left side   . Appendicitis   . AKI (acute kidney injury) ([place]) 06/22/2020  . Fall at home, initial encounter 06/22/2020  . Osteopenia 05/22/2020  . Status post total shoulder arthroplasty, left 01/10/2020  . Well woman exam without gynecological exam 05/02/2019  . SCC (squamous cell carcinoma), hand, right 05/02/2019  . Advice given about COVID-19 virus infection 05/02/2019  . CKD (chronic kidney disease) stage 3, GFR 30-59 ml/min 07/05/2018  . CLL (chronic lymphocytic leukemia) ([place])  03/13/2018  . Anxiety 03/13/2018  . Bilateral edema of lower extremity 01/18/2018  . Pleural effusion, bilateral 12/19/2017  . Acute on chronic renal failure ([place]) 11/28/2017  . Hypokalemia 11/28/2017  . Hypotension 11/18/2017  . History of endometrial cancer 11/18/2017  . Pelvic lymphadenopathy 11/18/2017  . Microcytic anemia 11/18/2017  . Daytime sleepiness 08/22/2017  . Medication side effect, initial encounter 08/22/2017  . Coronary artery disease of native artery of native heart with stable angina pectoris ([place]) 04/03/2017  . S/P shoulder replacement, right 08/19/2016  . Type 2 diabetes mellitus with hyperglycemia, without long-term current use of insulin ([place]) 10/15/2015  . Obesity 01/24/2015  . Generalized anxiety disorder 01/02/2015  . Chronic radicular low back pain 04/01/2014  . Tachycardia 01/31/2013  . Hip fracture, left ([place]) 10/24/2012  . Postop Hyponatremia 12/21/2011  . OA (osteoarthritis) 09/20/2011  . Hyperlipidemia 11/06/2010  . Depression with anxiety 11/06/2010  . Essential hypertension 11/06/2010  . GERD 11/06/2010     Past Medical History:  Diagnosis Date  . Anxiety   . Arthritis   . CLL (chronic lymphocytic leukemia) (HCC)    Dx. 2019 Dr. Irene Limbo'  . Depression    Wellbutrin  . Diabetes mellitus    Type II  . Endometrial ca ([place])    1998  . 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 GERD (gastroesophageal reflux disease)   . Heart murmur    patient states "cardiologist has not heard heart murmur for past several years"  . Heel spur   . History of hiatal hernia    small  . History of squamous cell carcinoma in  situ 02/19/2019   [facility] - Pathology from right hand dorsal mass excision on 4.8.20  . Hyperkalemia   . Hyperlipidemia   . Hypertension   . Left leg swelling    "swelling in left leg from knee down when sitting for prolonged periods or being on feet for a long period of time"  . PONV  (postoperative nausea and vomiting)    after hysterectomy  . Renal insufficiency    Stage 3 b   Dr. Particia Nearing first visit 11-2019     Past Surgical History:  Procedure Laterality Date  . ABDOMINAL HYSTERECTOMY  1998  . COLONOSCOPY    . EYE SURGERY Bilateral    cataracts  . EYE SURGERY Left    retina tear  . FEMUR IM NAIL  10/25/2012   Procedure: INTRAMEDULLARY (IM) NAIL FEMORAL;  Surgeon: Mauri Pole, MD;  Location: [facility];  Service: Orthopedics;  Laterality: Left;  . HIP SURGERY     Fracture L IM nail and 3 rods  . left knee arthroscopy  12/2010  . LYMPH NODE BIOPSY     axillary left 12-2018  . REFRACTIVE SURGERY    . skin cancer removed right and and lower forearm     squamoun in situ  . TOTAL KNEE ARTHROPLASTY  12/20/2011   Procedure: TOTAL KNEE ARTHROPLASTY;  Surgeon: Gearlean Alf, MD;  Location: [facility];  Service: Orthopedics;  Laterality: Left;  Failed attempt at spinal   . TOTAL SHOULDER ARTHROPLASTY Right 08/19/2016   Procedure: RIGHT TOTAL SHOULDER ARTHROPLASTY;  Surgeon: Justice Britain, MD;  Location: [place];  Service: Orthopedics;  Laterality: Right;  . TOTAL SHOULDER ARTHROPLASTY Left 01/10/2020   Procedure: TOTAL SHOULDER ARTHROPLASTY;  Surgeon: Justice Britain, MD;  Location: [facility];  Service: Orthopedics;  Laterality: Left;  163mn    Social History   Socioeconomic History  . Marital status: Single    Spouse name: Not on file  . Number of children: Not on file  . Years of education: Not on file  . Highest education level: Not on file  Occupational History  . Occupation: RTherapist, [employer] Tobacco Use  . Smoking status: Never Smoker  . Smokeless tobacco: Never Used  Vaping Use  . Vaping Use: Never used  Substance and Sexual Activity  . Alcohol use: No  . Drug use: No  . Sexual activity: Not Currently  Other Topics Concern  . 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 Difficulty of Paying Living Expenses: Not on file  Food Insecurity:   . Worried About RCharity fundraiserin the Last Year: Not on file  . Ran Out of Food in the Last Year: Not on file  Transportation Needs:   . Lack of Transportation (Medical): Not on file  . Lack of Transportation (Non-Medical): Not on file  Physical Activity:   . Days of Exercise per Week: Not on file  . Minutes of Exercise per Session: Not on file  Stress:   . Feeling of Stress : Not on file  Social Connections:   . Frequency of Communication with Friends and Family: Not on file  . Frequency of Social Gatherings with Friends and Family: Not on file  . Attends Religious Services: Not on file  .  Active Member of Clubs or Organizations: Not on file  . Attends Archivist Meetings: Not on file  . Marital Status: Not on file  Intimate Partner Violence:   . Fear of Current or Ex-Partner: Not on file  . Emotionally Abused: Not on file  . Physically Abused: Not on file  . Sexually Abused: Not on file     Family History  Problem Relation Age of Onset  . Cancer Mother        breast cancer  . Cancer Father        plasma sarcoma  . Breast cancer Neg Hx      Current Facility-Administered Medications:  .  acetaminophen (TYLENOL) tablet 500-1,000 mg, 500-1,000 mg, Oral, Q6H PRN, Agbata, Tochukwu, MD, 1,000 mg at 06/24/20 1005 .  ALPRAZolam (XANAX) tablet 0.5 mg, 0.5 mg, Oral, BID PRN, Agbata, Tochukwu, MD, 0.5 mg at 06/22/20 1842 .  aspirin chewable tablet 81 mg, 81 mg, Oral, QODAY, Agbata, Tochukwu, MD, 81 mg at 06/24/20 0956 .  buPROPion (WELLBUTRIN XL) 24 hr tablet 300 mg, 300 mg, Oral, Daily, Agbata, Tochukwu, MD, 300 mg at 06/24/20 0957 .  cefTRIAXone (ROCEPHIN) 2 g in sodium chloride 0.9 % 100 mL IVPB, 2 g, Intravenous, Q24H, Agbata, Tochukwu, MD, Stopped at 06/23/20 1705 .  heparin injection 5,000 Units, 5,000 Units, Subcutaneous, Q8H, Wieting, Richard, MD .   insulin aspart (novoLOG) injection 0-15 Units, 0-15 Units, Subcutaneous, Q4H, Agbata, Tochukwu, MD, 2 Units at 06/23/20 2119 .  iron polysaccharides (NIFEREX) capsule 150 mg, 150 mg, Oral, QHS, Agbata, Tochukwu, MD, 150 mg at 06/23/20 2117 .  lactated ringers infusion, , Intravenous, Continuous, Loletha Grayer, MD, Last Rate: 75 mL/hr at 06/23/20 2115, New Bag at 06/23/20 2115 .  lidocaine (LIDODERM) 5 % 1 patch, 1 patch, Transdermal, Q24H, Agbata, Tochukwu, MD, 1 patch at 06/23/20 1442 .  methocarbamol (ROBAXIN) tablet 500 mg, 500 mg, Oral, Q8H PRN, Edison Simon R, PA-C .  metroNIDAZOLE (FLAGYL) IVPB 500 mg, 500 mg, Intravenous, Q8H, Agbata, Tochukwu, MD, Last Rate: 100 mL/hr at 06/24/20 0958, 500 mg at 06/24/20 0958 .  midodrine (PROAMATINE) tablet 5 mg, 5 mg, Oral, TID WC, Wieting, Richard, MD, 5 mg at 06/24/20 1228 .  nystatin (MYCOSTATIN) 100000 UNIT/ML suspension 500,000 Units, 5 mL, Oral, QID, Loletha Grayer, MD, 500,000 Units at 06/24/20 0957 .  ondansetron (ZOFRAN) tablet 4 mg, 4 mg, Oral, Q6H PRN **OR** ondansetron (ZOFRAN) injection 4 mg, 4 mg, Intravenous, Q6H PRN, Agbata, Tochukwu, MD .  oxyCODONE (Oxy IR/ROXICODONE) immediate release tablet 5 mg, 5 mg, Oral, Q4H PRN, Loletha Grayer, MD, 5 mg at 06/23/20 1657 .  simvastatin (ZOCOR) tablet 40 mg, 40 mg, Oral, QHS, Agbata, Tochukwu, MD, 40 mg at 06/23/20 2116 .  triamcinolone cream (KENALOG) 0.1 % 1 application, 1 application, Topical, BID PRN, Agbata, Tochukwu, MD .  vitamin B-12 (CYANOCOBALAMIN) tablet 1,000 mcg, 1,000 mcg, Oral, QPM, Rowland Lathe, RPH, 1,000 mcg at 06/23/20 2118   Physical exam:  Vitals:   06/24/20 0500 06/24/20 0600 06/24/20 0914 06/24/20 1134  BP:   (!) 92/43 (!) 115/58  Pulse:   92 95  Resp: 17 20 18 19   Temp:   98.4 F (36.9 C) 97.8 F (36.6 C)  TempSrc:   Oral   SpO2:   97% 95%  Weight:      Height:       Physical Exam Constitutional:      General: She is not in acute distress.  Appearance: She is not diaphoretic.  HENT:     Head: Normocephalic and atraumatic.     Nose: Nose normal.     Mouth/Throat:     Pharynx: No oropharyngeal exudate.  Eyes:     General: No scleral icterus.    Pupils: Pupils are equal, round, and reactive to light.  Cardiovascular:     Rate and Rhythm: Normal rate and regular rhythm.     Heart sounds: No murmur heard.   Pulmonary:     Effort: Pulmonary effort is normal. No respiratory distress.     Breath sounds: No rales.  Chest:     Chest wall: No tenderness.  Abdominal:     General: There is no distension.     Palpations: Abdomen is soft.     Tenderness: There is no abdominal tenderness.  Musculoskeletal:     Cervical back: Normal range of motion and neck supple.     Comments: Left upper extremity swelling  Skin:    General: Skin is warm and dry.     Findings: No erythema.  Neurological:     Mental Status: She is alert and oriented to person, place, and time.     Cranial Nerves: No cranial nerve deficit.     Motor: No abnormal muscle tone.     Coordination: Coordination normal.  Psychiatric:        Mood and Affect: Affect normal.        CMP Latest Ref Rng & Units 06/24/2020  Glucose 70 - 99 mg/dL 97  BUN 8 - 23 mg/dL 43(H)  Creatinine 0.44 - 1.00 mg/dL 2.12(H)  Sodium 135 - 145 mmol/L 137  Potassium 3.5 - 5.1 mmol/L 4.1  Chloride 98 - 111 mmol/L 106  CO2 22 - 32 mmol/L 21(L)  Calcium 8.9 - 10.3 mg/dL 7.9(L)  Total Protein 6.5 - 8.1 g/dL -  Total Bilirubin 0.3 - 1.2 mg/dL -  Alkaline Phos 38 - 126 U/L -  AST 15 - 41 U/L -  ALT 0 - 44 U/L -   CBC Latest Ref Rng & Units 06/24/2020  WBC 4.0 - 10.5 K/uL 10.8(H)  Hemoglobin 12.0 - 15.0 g/dL 9.9(L)  Hematocrit 36 - 46 % 30.3(L)  Platelets 150 - 400 K/uL 141(L)    RADIOGRAPHIC STUDIES: I have personally reviewed the radiological images as listed and agreed with the findings in the report. CT ABDOMEN PELVIS WO CONTRAST  Result Date: 06/22/2020 CLINICAL DATA:   Fall with left flank and rib injury with bruising. Weakness and dizziness. EXAM: CT CHEST, ABDOMEN AND PELVIS WITHOUT CONTRAST TECHNIQUE: Multidetector CT imaging of the chest, abdomen and pelvis was performed following the standard protocol without IV contrast. COMPARISON:  Chest radiograph from earlier today. History of lymphoma. FINDINGS: CT CHEST FINDINGS Cardiovascular: Stable mild cardiomegaly. Stable trace pericardial effusion/thickening. Three-vessel coronary atherosclerosis. Atherosclerotic nonaneurysmal thoracic aorta. No acute intramural hematoma in the thoracic aorta. Normal caliber pulmonary arteries. Mediastinum/Nodes: No pneumomediastinum. No mediastinal hematoma. No discrete thyroid nodules. Unremarkable esophagus. Moderate bilateral axillary/retropectoral lymphadenopathy, increased. Representative 1.9 cm left axillary node (series 2/image 10), increased from 1.2 cm on 05/01/2018 CT. Representative 1.9 cm right axillary node (series 2/image 18), increased from 1.5 cm. Enlarged 1.9 cm subcarinal node (series 2/image 25), increased from 1.5 cm. New mild bilateral paratracheal adenopathy up to 1.1 cm on the left (series 2/image 16). No discrete hilar adenopathy on these noncontrast images. Lungs/Pleura: No pneumothorax. No pleural effusion. No acute consolidative airspace disease or lung masses. Mild platelike atelectasis  at the dependent lung bases bilaterally. Numerous (greater than 20) solid pulmonary nodules scattered throughout both lungs, mildly increased. Representative 0.9 cm medial right middle lobe nodule (series 4/image 73), increased from 0.7 cm. Representative 0.7 cm anterior right lower lobe nodule (series 4/image 65), increased from 0.6 cm. Representative 0.4 cm superior segment left lower lobe nodule (series 4/image 55), previously 0.3 cm. Musculoskeletal: No aggressive appearing focal osseous lesions. Acute nondisplaced anterior left fifth, sixth, seventh and eighth rib fractures.  Marked thoracic spondylosis. Bilateral shoulder arthroplasties, partially visualized. CT ABDOMEN PELVIS FINDINGS Hepatobiliary: Normal liver with no liver mass. Cholelithiasis. No biliary ductal dilatation. Pancreas: Normal, with no laceration, mass or duct dilation. Spleen: Normal size spleen.  No splenic mass. Adrenals/Urinary Tract: Normal adrenals. No hydronephrosis. No contour deforming renal masses. No renal stones. Normal bladder. Stomach/Bowel: Grossly normal stomach. Normal caliber small bowel with no small bowel wall thickening. Diffusely dilated and thick-walled appendix (14 mm diameter) with prominent periappendiceal fat stranding adjacent to the mid to distal appendix, suggesting acute appendicitis. No appendicolith. No periappendiceal free air or measurable collection. Normal large bowel with no diverticulosis, large bowel wall thickening or pericolonic fat stranding. Vascular/Lymphatic: Atherosclerotic nonaneurysmal abdominal aorta. Porta hepatis, aortocaval, left para-aortic and bulky bilateral iliac lymphadenopathy is increased. Representative 2.1 cm porta hepatis node (series 2/image 50), previously 1.9 cm. Representative 1.7 cm left para-aortic node (series 2/image 74), previously 1.2 cm. Representative 4.0 cm left external iliac node (series 2/image 99), previously 3.3 cm. Reproductive: Status post hysterectomy, with no abnormal findings at the vaginal cuff. No adnexal mass. Other: No pneumoperitoneum, ascites or focal fluid collection. Musculoskeletal: No aggressive appearing focal osseous lesions. No fracture in the abdomen or pelvis. Partially visualized fixation hardware in the proximal left femur. Marked lumbar spondylosis. IMPRESSION: 1. Acute nondisplaced anterior left fifth, sixth, seventh and eighth rib fractures. No pneumothorax. 2. No acute traumatic injury in the abdomen or pelvis. 3. CT findings suggestive of acute appendicitis. No free air or measurable abscess on this noncontrast  scan. Surgical consultation advised. 4. Widespread lymphadenopathy, increased since 05/01/2018 CT as detailed, compatible with progression of lymphoma. 5. Numerous nonspecific chronic solid pulmonary nodules scattered throughout both lungs, mildly increased, also potentially due to lymphoma. 6. Stable mild cardiomegaly. Three-vessel coronary atherosclerosis. 7. Cholelithiasis. 8. Aortic Atherosclerosis (ICD10-I70.0). These results were called by telephone at the time of interpretation on 06/22/2020 at 11:20 am to provider Lavonia Drafts, who verbally acknowledged these results. Electronically Signed   By: Ilona Sorrel M.D.   On: 06/22/2020 11:23   CT Chest Wo Contrast  Result Date: 06/22/2020 CLINICAL DATA:  Fall with left flank and rib injury with bruising. Weakness and dizziness. EXAM: CT CHEST, ABDOMEN AND PELVIS WITHOUT CONTRAST TECHNIQUE: Multidetector CT imaging of the chest, abdomen and pelvis was performed following the standard protocol without IV contrast. COMPARISON:  Chest radiograph from earlier today. History of lymphoma. FINDINGS: CT CHEST FINDINGS Cardiovascular: Stable mild cardiomegaly. Stable trace pericardial effusion/thickening. Three-vessel coronary atherosclerosis. Atherosclerotic nonaneurysmal thoracic aorta. No acute intramural hematoma in the thoracic aorta. Normal caliber pulmonary arteries. Mediastinum/Nodes: No pneumomediastinum. No mediastinal hematoma. No discrete thyroid nodules. Unremarkable esophagus. Moderate bilateral axillary/retropectoral lymphadenopathy, increased. Representative 1.9 cm left axillary node (series 2/image 10), increased from 1.2 cm on 05/01/2018 CT. Representative 1.9 cm right axillary node (series 2/image 18), increased from 1.5 cm. Enlarged 1.9 cm subcarinal node (series 2/image 25), increased from 1.5 cm. New mild bilateral paratracheal adenopathy up to 1.1 cm on the left (series 2/image 16). No  discrete hilar adenopathy on these noncontrast images.  Lungs/Pleura: No pneumothorax. No pleural effusion. No acute consolidative airspace disease or lung masses. Mild platelike atelectasis at the dependent lung bases bilaterally. Numerous (greater than 20) solid pulmonary nodules scattered throughout both lungs, mildly increased. Representative 0.9 cm medial right middle lobe nodule (series 4/image 73), increased from 0.7 cm. Representative 0.7 cm anterior right lower lobe nodule (series 4/image 65), increased from 0.6 cm. Representative 0.4 cm superior segment left lower lobe nodule (series 4/image 55), previously 0.3 cm. Musculoskeletal: No aggressive appearing focal osseous lesions. Acute nondisplaced anterior left fifth, sixth, seventh and eighth rib fractures. Marked thoracic spondylosis. Bilateral shoulder arthroplasties, partially visualized. CT ABDOMEN PELVIS FINDINGS Hepatobiliary: Normal liver with no liver mass. Cholelithiasis. No biliary ductal dilatation. Pancreas: Normal, with no laceration, mass or duct dilation. Spleen: Normal size spleen.  No splenic mass. Adrenals/Urinary Tract: Normal adrenals. No hydronephrosis. No contour deforming renal masses. No renal stones. Normal bladder. Stomach/Bowel: Grossly normal stomach. Normal caliber small bowel with no small bowel wall thickening. Diffusely dilated and thick-walled appendix (14 mm diameter) with prominent periappendiceal fat stranding adjacent to the mid to distal appendix, suggesting acute appendicitis. No appendicolith. No periappendiceal free air or measurable collection. Normal large bowel with no diverticulosis, large bowel wall thickening or pericolonic fat stranding. Vascular/Lymphatic: Atherosclerotic nonaneurysmal abdominal aorta. Porta hepatis, aortocaval, left para-aortic and bulky bilateral iliac lymphadenopathy is increased. Representative 2.1 cm porta hepatis node (series 2/image 50), previously 1.9 cm. Representative 1.7 cm left para-aortic node (series 2/image 74), previously 1.2 cm.  Representative 4.0 cm left external iliac node (series 2/image 99), previously 3.3 cm. Reproductive: Status post hysterectomy, with no abnormal findings at the vaginal cuff. No adnexal mass. Other: No pneumoperitoneum, ascites or focal fluid collection. Musculoskeletal: No aggressive appearing focal osseous lesions. No fracture in the abdomen or pelvis. Partially visualized fixation hardware in the proximal left femur. Marked lumbar spondylosis. IMPRESSION: 1. Acute nondisplaced anterior left fifth, sixth, seventh and eighth rib fractures. No pneumothorax. 2. No acute traumatic injury in the abdomen or pelvis. 3. CT findings suggestive of acute appendicitis. No free air or measurable abscess on this noncontrast scan. Surgical consultation advised. 4. Widespread lymphadenopathy, increased since 05/01/2018 CT as detailed, compatible with progression of lymphoma. 5. Numerous nonspecific chronic solid pulmonary nodules scattered throughout both lungs, mildly increased, also potentially due to lymphoma. 6. Stable mild cardiomegaly. Three-vessel coronary atherosclerosis. 7. Cholelithiasis. 8. Aortic Atherosclerosis (ICD10-I70.0). These results were called by telephone at the time of interpretation on 06/22/2020 at 11:20 am to provider Lavonia Drafts, who verbally acknowledged these results. Electronically Signed   By: Ilona Sorrel M.D.   On: 06/22/2020 11:23   DG Chest Port 1 View  Result Date: 06/24/2020 CLINICAL DATA:  Chest pain, fever. EXAM: PORTABLE CHEST 1 VIEW COMPARISON:  June 22, 2020. FINDINGS: Stable cardiomediastinal silhouette. Mild central pulmonary vascular congestion is noted. No pneumothorax or pleural effusion is noted. Mild bibasilar subsegmental atelectasis is noted. Status post bilateral shoulder arthroplasties. IMPRESSION: Mild bibasilar subsegmental atelectasis. Mild central pulmonary vascular congestion is noted. Aortic Atherosclerosis (ICD10-I70.0). Electronically Signed   By: Marijo Conception  M.D.   On: 06/24/2020 08:44   DG Chest Port 1 View  Result Date: 06/22/2020 CLINICAL DATA:  69 year old female with history of weakness for the past 3 weeks and dizziness for the past 24 hours. EXAM: PORTABLE CHEST 1 VIEW COMPARISON:  Chest x-ray 10/24/2012. FINDINGS: Lung volumes are low. No consolidative airspace disease. No pleural effusions. No pneumothorax.  No pulmonary nodule or mass noted. Pulmonary vasculature and the cardiomediastinal silhouette are within normal limits. Atherosclerosis in the thoracic aorta. Status post bilateral shoulder arthroplasty. IMPRESSION: 1. Low lung volumes without radiographic evidence of acute cardiopulmonary disease. 2. Aortic atherosclerosis. Electronically Signed   By: Vinnie Langton M.D.   On: 06/22/2020 09:09    Assessment and plan-  Patient is a 69 y.o. female with known history of Rai stage I CLL, CKD, hypertension, hyperlipidemia, GERD, history of endometrial cancer, diabetes, depression, arthritis anxiety currently admitted due to fall and hypotension.  #CLL CT findings are reviewed and discussed with patient.  Consistent with progression of CLL comparing to CT that was done in 2019.  Patient also has an acute increase of total leukocytosis, which may be due to acute reactive process. LDH is 154, remains stable comparing to July 2021. Leukocytosis is trending down.  Predominantly neutrophilia likely reactive.  No significant lymphocytosis She continues to deny any constitutional symptoms continue fairly acute onset of her symptoms prior to current admission. Recommend watchful waiting and allow her to recover from acute medical problems. Follow-up outpatient with Dr.Kale and monitor her counts.-  #Worsening of anemia, multifactorial.  May be due to acute decrease of renal function.  Monitor outpatient. #AKI, due to volume depletion.  Nephrology on board.  Continue hydration, hold diuretics.  Creatinine is improved #CT findings of appendicitis,  patient has been evaluated by surgery.  Patient does not have abdominal symptoms.  Surgery recommend to manage conservatively. #Left upper extremity swelling, I updated patient's RN Ria Comment to check her IV sites.  Possible amputation.  If no IV issue, recommend ultrasound to rule out upper extremity blood clots.  Thank you for allowing me to participate in the care of this patient.   Earlie Server, MD, PhD Hematology Oncology [facility] at [facility] Pager- [phone] 06/24/2020

## 2020-06-25 ENCOUNTER — Inpatient Hospital Stay: Payer: Medicare Other

## 2020-06-25 LAB — GLUCOSE, CAPILLARY
Glucose-Capillary: 102 mg/dL — ABNORMAL HIGH (ref 70–99)
Glucose-Capillary: 114 mg/dL — ABNORMAL HIGH (ref 70–99)
Glucose-Capillary: 112 mg/dL — ABNORMAL HIGH (ref 70–99)
Glucose-Capillary: 97 mg/dL (ref 70–99)
Glucose-Capillary: 111 mg/dL — ABNORMAL HIGH (ref 70–99)

## 2020-06-25 NOTE — Progress Notes (Signed)
[facility]  ROUNDING NOTE   Subjective:  Patient appears much pleasant, awake and alert, in no acute distress. She just came back from an [place]. Reports pain is better on her chest wall and she is trying to use Incentive spirometer as instructed. Denies SOB,nausea, or vomiting.  Objective:  Vital signs in last 24 hours:  Temp:  [98 F (36.7 C)-98.7 F (37.1 C)] 98 F (36.7 C) (08/25 0210) Pulse Rate:  [90-103] 103 (08/25 0210) Resp:  [16-19] 16 (08/24 2000) BP: (123-142)/(59-71) 140/69 (08/25 0210) SpO2:  [93 %-97 %] 93 % (08/25 0210)  Weight change:  Filed Weights   06/22/20 0834 06/23/20 2155 06/24/20 0342  Weight: 83.5 kg 92.1 kg 89.2 kg    Intake/Output: I/O last 3 completed shifts: In: 1947.6 [I.V.:750; IV Piggyback:1197.6] Out: 800 [Urine:800]   Intake/Output this shift:  No intake/output data recorded.  Physical Exam: General:   In no acute distress  Head:   Normocephalic,has [place] with O2 on flow  Eyes:  Sclerae and conjunctivae clear  Neck:  Trachea at midline  Lungs:   Normal and symmetrical respiratory effort,Lungs clear  Heart:  Regular rate and rhythm  Abdomen:   Soft, nontender,  Non distended  Extremities:  No peripheral edema.  Neurologic:  Awake, alert, oriented  Skin:  Abrasions noted on Rt side of the face and lip        Basic Metabolic Panel: Recent Labs  Lab 06/22/20 0845 06/22/20 0845 06/22/20 1622 06/22/20 1622 06/22/20 2330 06/23/20 0919 06/24/20 1006  NA 132*  --  135  --  133* 138 137  K 3.8  --  3.1*  --  3.6 4.5 4.1  CL 98  --  107  --  105 108 106  CO2 19*  --  17*  --  15* 22 21*  GLUCOSE 90  --  41*  --  202* 152* 97  BUN 77*  --  62*  --  67* 56* 43*  CREATININE 4.63*  --  3.05*  --  3.60* 2.70* 2.12*  CALCIUM 7.8*   < > 6.2*   < > 7.2* 7.4* 7.9*   < > = values in this interval not displayed.    Liver Function Tests: Recent Labs  Lab 06/22/20 0845 06/22/20 2330  AST 15 24  ALT 12 12  ALKPHOS 71 67   BILITOT 0.9 0.9  PROT 5.8* 5.2*  ALBUMIN 3.4* 3.0*   No results for input(s): LIPASE, AMYLASE in the last 168 hours. No results for input(s): AMMONIA in the last 168 hours.  CBC: Recent Labs  Lab 06/22/20 0845 06/22/20 2330 06/23/20 0919 06/24/20 1006  WBC 15.4* 13.7* 15.4*  15.4* 10.8*  NEUTROABS  --   --  10.6*  --   HGB 10.9* 9.7* 10.0*  9.8* 9.9*  HCT 33.8* 28.8* 31.7*  30.8* 30.3*  MCV 88.9 87.0 91.4  90.9 88.6  PLT 160 160 147*  147* 141*    Cardiac Enzymes: Recent Labs  Lab 06/22/20 0845  CKTOTAL 209    BNP: Invalid input(s): POCBNP  CBG: Recent Labs  Lab 06/24/20 1133 06/24/20 1620 06/24/20 2059 06/25/20 0208 06/25/20 0857  GLUCAP 99 93 93 102* 114*    Microbiology: Results for orders placed or performed during the hospital encounter of 06/22/20  SARS Coronavirus 2 by RT PCR (hospital order, performed in [facility] hospital lab) Nasopharyngeal Nasopharyngeal Swab     Status: None   Collection Time: 06/22/20 11:44 AM  Specimen: Nasopharyngeal Swab  Result Value Ref Range Status   SARS Coronavirus 2 NEGATIVE NEGATIVE Final    Comment: (NOTE) SARS-CoV-2 target nucleic acids are NOT DETECTED.  The SARS-CoV-2 RNA is generally detectable in upper and lower respiratory specimens during the acute phase of infection. The lowest concentration of SARS-CoV-2 viral copies this assay can detect is 250 copies / mL. A negative result does not preclude SARS-CoV-2 infection and should not be used as the sole basis for treatment or other patient management decisions.  A negative result may occur with improper specimen collection / handling, submission of specimen other than nasopharyngeal swab, presence of viral mutation(s) within the areas targeted by this assay, and inadequate number of viral copies (<250 copies / mL). A negative result must be combined with clinical observations, patient history, and epidemiological information.  Fact Sheet for  Patients:   StrictlyIdeas.no  Fact Sheet for Healthcare Providers: BankingDealers.co.za  This test is not yet approved or  cleared by the [place] FDA and has been authorized for detection and/or diagnosis of SARS-CoV-2 by FDA under an Emergency Use Authorization (EUA).  This EUA will remain in effect (meaning this test can be used) for the duration of the COVID-19 declaration under Section 564(b)(1) of the Act, 21 U.S.C. section 360bbb-3(b)(1), unless the authorization is terminated or revoked sooner.  Performed at [facility], [address] [place], [place] [address]   Culture, blood (x 2)     Status: None (Preliminary result)   Collection Time: 06/23/20  1:35 AM   Specimen: BLOOD  Result Value Ref Range Status   Specimen Description BLOOD BLOOD RIGHT HAND  Final   Special Requests   Final    BOTTLES DRAWN AEROBIC AND ANAEROBIC Blood Culture adequate volume   Culture   Final    NO GROWTH 2 DAYS Performed at [facility], [address] [place], [place] [address]    Report Status PENDING  Incomplete  Culture, blood (x 2)     Status: None (Preliminary result)   Collection Time: 06/23/20  2:30 AM   Specimen: BLOOD  Result Value Ref Range Status   Specimen Description BLOOD BLOOD RIGHT HAND  Final   Special Requests   Final    BOTTLES DRAWN AEROBIC AND ANAEROBIC Blood Culture adequate volume   Culture   Final    NO GROWTH 2 DAYS Performed at [facility], [address] [place], [place] [address]    Report Status PENDING  Incomplete  C Difficile Quick Screen w PCR reflex     Status: None   Collection Time: 06/24/20  4:25 PM   Specimen: STOOL  Result Value Ref Range Status   C Diff antigen NEGATIVE NEGATIVE Final   C Diff toxin NEGATIVE NEGATIVE Final   C Diff interpretation No C. difficile detected.  Final    Comment: Performed at [facility], [address]  [place],  [address]  Gastrointestinal Panel by PCR , Stool     Status: None   Collection Time: 06/24/20  4:25 PM   Specimen: Stool  Result Value Ref Range Status   Campylobacter species NOT DETECTED NOT DETECTED Final   Plesimonas shigelloides NOT DETECTED NOT DETECTED Final   Salmonella species NOT DETECTED NOT DETECTED Final   Yersinia enterocolitica NOT DETECTED NOT DETECTED Final   Vibrio species NOT DETECTED NOT DETECTED Final   Vibrio cholerae NOT DETECTED NOT DETECTED Final   Enteroaggregative E coli (EAEC) NOT DETECTED NOT DETECTED  Final   Enteropathogenic E coli (EPEC) NOT DETECTED NOT DETECTED Final   Enterotoxigenic E coli (ETEC) NOT DETECTED NOT DETECTED Final   Shiga like toxin producing E coli (STEC) NOT DETECTED NOT DETECTED Final   Shigella/Enteroinvasive E coli (EIEC) NOT DETECTED NOT DETECTED Final   Cryptosporidium NOT DETECTED NOT DETECTED Final   Cyclospora cayetanensis NOT DETECTED NOT DETECTED Final   Entamoeba histolytica NOT DETECTED NOT DETECTED Final   Giardia lamblia NOT DETECTED NOT DETECTED Final   Adenovirus F40/41 NOT DETECTED NOT DETECTED Final   Astrovirus NOT DETECTED NOT DETECTED Final   Norovirus GI/GII NOT DETECTED NOT DETECTED Final   Rotavirus A NOT DETECTED NOT DETECTED Final   Sapovirus (I, II, IV, and V) NOT DETECTED NOT DETECTED Final    Comment: Performed at [facility], [address] [place], [place] [address]    Coagulation Studies: Recent Labs    06/23/20 0229  LABPROT 14.7  INR 1.2    Urinalysis: No results for input(s): COLORURINE, LABSPEC, PHURINE, GLUCOSEU, HGBUR, BILIRUBINUR, KETONESUR, PROTEINUR, UROBILINOGEN, NITRITE, LEUKOCYTESUR in the last 72 hours.  Invalid input(s): APPERANCEUR    Imaging: US Venous Img Upper Uni Left (DVT)  Result Date: 06/25/2020 CLINICAL DATA:  Left upper extremity swelling for the past day. Evaluate for DVT. EXAM: LEFT UPPER EXTREMITY VENOUS DOPPLER ULTRASOUND TECHNIQUE:  Gray-scale sonography with graded compression, as well as color Doppler and duplex ultrasound were performed to evaluate the upper extremity deep venous system from the level of the subclavian vein and including the jugular, axillary, basilic, radial, ulnar and upper cephalic vein. Spectral Doppler was utilized to evaluate flow at rest and with distal augmentation maneuvers. COMPARISON:  None. FINDINGS: Contralateral Subclavian Vein: Respiratory phasicity is normal and symmetric with the symptomatic side. No evidence of thrombus. Normal compressibility. Internal Jugular Vein: No evidence of thrombus. Normal compressibility, respiratory phasicity and response to augmentation. Subclavian Vein: No evidence of thrombus. Normal compressibility, respiratory phasicity and response to augmentation. Axillary Vein: No evidence of thrombus. Normal compressibility, respiratory phasicity and response to augmentation. Cephalic Vein: While the proximal and mid aspects of the cephalic vein remains patent (images 12 and 13), there is mixed echogenic occlusive thrombus within distal aspect of the left cephalic vein (images 14, 16 and 17). Basilic Vein: There is short-segment hypoechoic occlusive thrombus involving the proximal aspect of the left basilic vein (images 20 and 25). The filling vein appears patent at its mid (image 21 and distal (image 22) aspects. Brachial Veins: No evidence of thrombus. Normal compressibility, respiratory phasicity and response to augmentation. Radial Veins: No evidence of thrombus. Normal compressibility, respiratory phasicity and response to augmentation. Ulnar Veins: No evidence of thrombus. Normal compressibility, respiratory phasicity and response to augmentation. Venous Reflux:  None visualized. Other Findings: Note is made of two prominent left cervical lymph nodes the largest of which measures approximately 1.2 cm in greatest short axis diameter. IMPRESSION: 1. No evidence of DVT within the left  upper extremity. 2. Examination is positive for occlusive SVT involving the proximal aspect of the left basilic vein and distal aspect of the left cephalic vein. 3. Note is made of two mildly enlarged left cervical lymph nodes, nonspecific though potentially reactive in etiology. Clinical correlation to resolution is advised. Electronically Signed   By: Sandi Mariscal M.D.   On: 06/25/2020 09:13   DG Chest Port 1 View  Result Date: 06/24/2020 CLINICAL DATA:  Chest pain, fever. EXAM: PORTABLE CHEST 1 VIEW COMPARISON:  June 22, 2020. FINDINGS:  Stable cardiomediastinal silhouette. Mild central pulmonary vascular congestion is noted. No pneumothorax or pleural effusion is noted. Mild bibasilar subsegmental atelectasis is noted. Status post bilateral shoulder arthroplasties. IMPRESSION: Mild bibasilar subsegmental atelectasis. Mild central pulmonary vascular congestion is noted. Aortic Atherosclerosis (ICD10-I70.0). Electronically Signed   By: Marijo Conception M.D.   On: 06/24/2020 08:44     Medications:   . cefTRIAXone (ROCEPHIN)  IV 2 g (06/24/20 1720)  . lactated ringers 75 mL/hr at 06/25/20 0033  . metronidazole 500 mg (06/25/20 1027)   . aspirin  81 mg Oral QODAY  . buPROPion  300 mg Oral Daily  . heparin injection (subcutaneous)  5,000 Units Subcutaneous Q8H  . insulin aspart  0-15 Units Subcutaneous Q4H  . iron polysaccharides  150 mg Oral QHS  . lidocaine  1 patch Transdermal Q24H  . nystatin  5 mL Oral QID  . simvastatin  40 mg Oral QHS  . vitamin B-12  1,000 mcg Oral QPM   acetaminophen, ALPRAZolam, loperamide, methocarbamol, ondansetron **OR** ondansetron (ZOFRAN) IV, oxyCODONE, triamcinolone cream  Assessment/ Plan:  69 y.o. female with medical problems ofCLL, diabetes, CKD, who was admitted to [facility] on8/22/2021for evaluation of AKI (acute kidney injury) ([place]) [N17.9].  #AKI/chronic kidney disease stage IIIb baseline creatinine 1.6, EGFR 30 #Volume depletion #Generalized  weakness Work-up in the emergency room showed increased creatinine of 4.63.  Creatinine today is 2.1 Will continue IV fluids, monitor renal function daily Avoid nephrotoxins as possible.   Continue to hold benazepril/HCTZ.     LOS: 3 Brenda [name] 8/25/202112:09 PM

## 2020-06-25 NOTE — Care Management Important Message (Signed)
Important Message  Patient Details  Name: Brenda [name] MRN: [id number] Date of Birth: 1951/08/26   Medicare Important Message Given:  Yes     Dannette Barbara 06/25/2020, 11:31 AM

## 2020-06-25 NOTE — Progress Notes (Addendum)
   06/25/20 2024  Assess: MEWS Score  Temp 98.7 F (37.1 C)  BP (!) 155/79  Pulse Rate (!) 113  Resp 20  SpO2 91 %  O2 Device Room Air  Per the chart pt has been consistantly in the 110 range of heart rate. Pt has no noted distress except for some mild anxiety.

## 2020-06-25 NOTE — Progress Notes (Addendum)
PROGRESS NOTE    Brenda [name]  XYI:[id number] DOB: [birth year] DOA: 06/22/2020 PCP: Ronnald Nian, DO   Brief Narrative:   Patient is a 69 year old female with a known history of CLL, hypertension, diabetes mellitus with complications of chronic kidney disease who was brought into the ER by EMS after a fall earlier this morning for evaluation of weakness and dizziness.  Patient noted to have acute kidney injury and has a serum creatinine of 4.63 compared to baseline of 1.67.  She has multiple rib fractures following the fall and CT scan findings suggestive of possible acute appendicitis.  She will be admitted to the hospital for further evaluation. General surgery consulted does not think it is appendicitis.  Assessment & Plan:   Principal Problem:   AKI (acute kidney injury) ([place]) Active Problems:   Hyperlipidemia   Depression with anxiety   Essential hypertension   Generalized anxiety disorder   Hypotension   CLL (chronic lymphocytic leukemia) (HCC)   CKD (chronic kidney disease) stage 3, GFR 30-59 ml/min   Fall at home, initial encounter   Appendicitis   Fever   1. Fever of 101.  So far cultures are negative.  Stool for C. difficile negative.  Patient on Rocephin and Flagyl but not quite sure what we are treating at this point.  Repeat chest x-ray does not show aspiration.  Had general surgery see again and they still do not think this is appendicitis.  Stool comprehensive panel pending.  Left upper extremity swelling : venous duplex negative for DVT but shows SVT.  2. Acute kidney injury. >>> Improving. Creatinine 4.63 on presentation down to 2.12.  Decrease rate of IV fluids.  3. Hypotension has resolved.  Continue to hold antihypertensive medications for right now.  Discontinue midodrine 4. Acute hypoxic respiratory failure.  This has resolved.  Now on room air. 5. 3 fractures on the left side after fall.  Pain control with oral oxycodone.  Incentive spirometry.   Physical therapy recommends rehab but patient would like to go home.  6. 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  Patient would like to follow-up with her oncologist Dr. Velvet Bathe as outpatient. 7. Depression and anxiety on Wellbutrin 8. Hyperlipidemia unspecified on simvastatin    DVT prophylaxis: Heparin  Code Status: Full Family Communication:  No one present at bed side, discussed with patient. Disposition Plan:  Dispo: The patient is from: Home  Anticipated d/c is to: Home  Anticipated d/c date is: We will need a few days here in the hospital to follow-up kidney function, blood pressure and watch fever  Patient currently being treated for acute kidney injury and now has a fever.  Consultants:    Nephrology, Oncology, General surgery  Procedures:   Antimicrobials: Anti-infectives (From admission, onward)   Start     Dose/Rate Route Frequency Ordered Stop   06/23/20 0100  metroNIDAZOLE (FLAGYL) IVPB 500 mg  Status:  Discontinued        500 mg 100 mL/hr over 60 Minutes Intravenous Every 8 hours 06/23/20 0046 06/23/20 0059   06/22/20 1600  cefTRIAXone (ROCEPHIN) 2 g in sodium chloride 0.9 % 100 mL IVPB        2 g 200 mL/hr over 30 Minutes Intravenous Every 24 hours 06/22/20 1516     06/22/20 1500  metroNIDAZOLE (FLAGYL) IVPB 500 mg        500 mg 100 mL/hr over 60 Minutes Intravenous Every 8 hours 06/22/20 1450       Subjective:  Patient was seen and examined at bedside.  No overnight events.  Patient reports feeling better,  she still complains about having chest pain because of the rib fractures.  Objective: Vitals:   06/24/20 1134 06/24/20 1621 06/24/20 2000 06/25/20 0210  BP: (!) 115/58 (!) 123/59 (!) 142/71 140/69  Pulse: 95 90 98 (!) 103  Resp: 19 19 16    Temp: 97.8 F (36.6 C) 98.2 F (36.8 C) 98.7 F (37.1 C) 98 F (36.7 C)  TempSrc:  Oral Oral Oral  SpO2: 95% 97% 95% 93%  Weight:      Height:         Intake/Output Summary (Last 24 hours) at 06/25/2020 1509 Last data filed at 06/25/2020 0311 Gross per 24 hour  Intake 1447.61 ml  Output --  Net 1447.61 ml   Filed Weights   06/22/20 0834 06/23/20 2155 06/24/20 0342  Weight: 83.5 kg 92.1 kg 89.2 kg    Examination:  General exam: Appears calm and comfortable  Respiratory system: Clear to auscultation. Respiratory effort normal. Cardiovascular system: S1 & S2 heard, RRR. No JVD, murmurs, rubs, gallops or clicks. No pedal edema. Gastrointestinal system: Abdomen is nondistended, soft and nontender. No organomegaly or masses felt. Normal bowel sounds heard. Central nervous system: Alert and oriented. No focal neurological deficits. Extremities: Symmetric 5 x 5 power. Skin: No rashes, lesions or ulcers Psychiatry: Judgement and insight appear normal. Mood & affect appropriate.     Data Reviewed: I have personally reviewed following labs and imaging studies  CBC: Recent Labs  Lab 06/22/20 0845 06/22/20 2330 06/23/20 0919 06/24/20 1006  WBC 15.4* 13.7* 15.4*  15.4* 10.8*  NEUTROABS  --   --  10.6*  --   HGB 10.9* 9.7* 10.0*  9.8* 9.9*  HCT 33.8* 28.8* 31.7*  30.8* 30.3*  MCV 88.9 87.0 91.4  90.9 88.6  PLT 160 160 147*  147* 527*   Basic Metabolic Panel: Recent Labs  Lab 06/22/20 0845 06/22/20 1622 06/22/20 2330 06/23/20 0919 06/24/20 1006  NA 132* 135 133* 138 137  K 3.8 3.1* 3.6 4.5 4.1  CL 98 107 105 108 106  CO2 19* 17* 15* 22 21*  GLUCOSE 90 41* 202* 152* 97  BUN 77* 62* 67* 56* 43*  CREATININE 4.63* 3.05* 3.60* 2.70* 2.12*  CALCIUM 7.8* 6.2* 7.2* 7.4* 7.9*   GFR: Estimated Creatinine Clearance: 23.7 mL/min (A) (by C-G formula based on SCr of 2.12 mg/dL (H)). Liver Function Tests: Recent Labs  Lab 06/22/20 0845 06/22/20 2330  AST 15 24  ALT 12 12  ALKPHOS 71 67  BILITOT 0.9 0.9  PROT 5.8* 5.2*  ALBUMIN 3.4* 3.0*   No results for input(s): LIPASE, AMYLASE in the last 168 hours. No results  for input(s): AMMONIA in the last 168 hours. Coagulation Profile: Recent Labs  Lab 06/23/20 0229  INR 1.2   Cardiac Enzymes: Recent Labs  Lab 06/22/20 0845  CKTOTAL 209   BNP (last 3 results) No results for input(s): PROBNP in the last 8760 hours. HbA1C: No results for input(s): HGBA1C in the last 72 hours. CBG: Recent Labs  Lab 06/24/20 1620 06/24/20 2059 06/25/20 0208 06/25/20 0857 06/25/20 1214  GLUCAP 93 93 102* 114* 112*   Lipid Profile: No results for input(s): CHOL, HDL, LDLCALC, TRIG, CHOLHDL, LDLDIRECT in the last 72 hours. Thyroid Function Tests: No results for input(s): TSH, T4TOTAL, FREET4, T3FREE, THYROIDAB in the last 72 hours. Anemia Panel: No results for input(s): VITAMINB12, FOLATE, FERRITIN, TIBC, IRON, RETICCTPCT  in the last 72 hours. Sepsis Labs: Recent Labs  Lab 06/22/20 2330  LATICACIDVEN 0.9    Recent Results (from the past 240 hour(s))  SARS Coronavirus 2 by RT PCR (hospital order, performed in [facility] hospital lab) Nasopharyngeal Nasopharyngeal Swab     Status: None   Collection Time: 06/22/20 11:44 AM   Specimen: Nasopharyngeal Swab  Result Value Ref Range Status   SARS Coronavirus 2 NEGATIVE NEGATIVE Final    Comment: (NOTE) SARS-CoV-2 target nucleic acids are NOT DETECTED.  The SARS-CoV-2 RNA is generally detectable in upper and lower respiratory specimens during the acute phase of infection. The lowest concentration of SARS-CoV-2 viral copies this assay can detect is 250 copies / mL. A negative result does not preclude SARS-CoV-2 infection and should not be used as the sole basis for treatment or other patient management decisions.  A negative result may occur with improper specimen collection / handling, submission of specimen other than nasopharyngeal swab, presence of viral mutation(s) within the areas targeted by this assay, and inadequate number of viral copies (<250 copies / mL). A negative result must be combined with  clinical observations, patient history, and epidemiological information.  Fact Sheet for Patients:   StrictlyIdeas.no  Fact Sheet for Healthcare Providers: BankingDealers.co.za  This test is not yet approved or  cleared by the [place] FDA and has been authorized for detection and/or diagnosis of SARS-CoV-2 by FDA under an Emergency Use Authorization (EUA).  This EUA will remain in effect (meaning this test can be used) for the duration of the COVID-19 declaration under Section 564(b)(1) of the Act, 21 U.S.C. section 360bbb-3(b)(1), unless the authorization is terminated or revoked sooner.  Performed at [facility], [address] [place], [place] [address]   Culture, blood (x 2)     Status: None (Preliminary result)   Collection Time: 06/23/20  1:35 AM   Specimen: BLOOD  Result Value Ref Range Status   Specimen Description BLOOD BLOOD RIGHT HAND  Final   Special Requests   Final    BOTTLES DRAWN AEROBIC AND ANAEROBIC Blood Culture adequate volume   Culture   Final    NO GROWTH 2 DAYS Performed at [facility], [address] [place], [place] [address]    Report Status PENDING  Incomplete  Culture, blood (x 2)     Status: None (Preliminary result)   Collection Time: 06/23/20  2:30 AM   Specimen: BLOOD  Result Value Ref Range Status   Specimen Description BLOOD BLOOD RIGHT HAND  Final   Special Requests   Final    BOTTLES DRAWN AEROBIC AND ANAEROBIC Blood Culture adequate volume   Culture   Final    NO GROWTH 2 DAYS Performed at [facility], [address] [place], [place] [address]    Report Status PENDING  Incomplete  C Difficile Quick Screen w PCR reflex     Status: None   Collection Time: 06/24/20  4:25 PM   Specimen: STOOL  Result Value Ref Range Status   C Diff antigen NEGATIVE NEGATIVE Final   C Diff toxin NEGATIVE NEGATIVE Final   C Diff interpretation No C. difficile  detected.  Final    Comment: Performed at [facility], [address] [place],  [address]  Gastrointestinal Panel by PCR , Stool     Status: None   Collection Time: 06/24/20  4:25 PM   Specimen: Stool  Result Value Ref Range Status   Campylobacter species NOT  DETECTED NOT DETECTED Final   Plesimonas shigelloides NOT DETECTED NOT DETECTED Final   Salmonella species NOT DETECTED NOT DETECTED Final   Yersinia enterocolitica NOT DETECTED NOT DETECTED Final   Vibrio species NOT DETECTED NOT DETECTED Final   Vibrio cholerae NOT DETECTED NOT DETECTED Final   Enteroaggregative E coli (EAEC) NOT DETECTED NOT DETECTED Final   Enteropathogenic E coli (EPEC) NOT DETECTED NOT DETECTED Final   Enterotoxigenic E coli (ETEC) NOT DETECTED NOT DETECTED Final   Shiga like toxin producing E coli (STEC) NOT DETECTED NOT DETECTED Final   Shigella/Enteroinvasive E coli (EIEC) NOT DETECTED NOT DETECTED Final   Cryptosporidium NOT DETECTED NOT DETECTED Final   Cyclospora cayetanensis NOT DETECTED NOT DETECTED Final   Entamoeba histolytica NOT DETECTED NOT DETECTED Final   Giardia lamblia NOT DETECTED NOT DETECTED Final   Adenovirus F40/41 NOT DETECTED NOT DETECTED Final   Astrovirus NOT DETECTED NOT DETECTED Final   Norovirus GI/GII NOT DETECTED NOT DETECTED Final   Rotavirus A NOT DETECTED NOT DETECTED Final   Sapovirus (I, II, IV, and V) NOT DETECTED NOT DETECTED Final    Comment: Performed at [facility], [address], [place],  [address]         Radiology Studies: US Venous Img Upper Uni Left (DVT)  Result Date: 06/25/2020 CLINICAL DATA:  Left upper extremity swelling for the past day. Evaluate for DVT. EXAM: LEFT UPPER EXTREMITY VENOUS DOPPLER ULTRASOUND TECHNIQUE: Gray-scale sonography with graded compression, as well as color Doppler and duplex ultrasound were performed to evaluate the upper extremity deep venous system from the level of the subclavian vein  and including the jugular, axillary, basilic, radial, ulnar and upper cephalic vein. Spectral Doppler was utilized to evaluate flow at rest and with distal augmentation maneuvers. COMPARISON:  None. FINDINGS: Contralateral Subclavian Vein: Respiratory phasicity is normal and symmetric with the symptomatic side. No evidence of thrombus. Normal compressibility. Internal Jugular Vein: No evidence of thrombus. Normal compressibility, respiratory phasicity and response to augmentation. Subclavian Vein: No evidence of thrombus. Normal compressibility, respiratory phasicity and response to augmentation. Axillary Vein: No evidence of thrombus. Normal compressibility, respiratory phasicity and response to augmentation. Cephalic Vein: While the proximal and mid aspects of the cephalic vein remains patent (images 12 and 13), there is mixed echogenic occlusive thrombus within distal aspect of the left cephalic vein (images 14, 16 and 17). Basilic Vein: There is short-segment hypoechoic occlusive thrombus involving the proximal aspect of the left basilic vein (images 20 and 25). The filling vein appears patent at its mid (image 21 and distal (image 22) aspects. Brachial Veins: No evidence of thrombus. Normal compressibility, respiratory phasicity and response to augmentation. Radial Veins: No evidence of thrombus. Normal compressibility, respiratory phasicity and response to augmentation. Ulnar Veins: No evidence of thrombus. Normal compressibility, respiratory phasicity and response to augmentation. Venous Reflux:  None visualized. Other Findings: Note is made of two prominent left cervical lymph nodes the largest of which measures approximately 1.2 cm in greatest short axis diameter. IMPRESSION: 1. No evidence of DVT within the left upper extremity. 2. Examination is positive for occlusive SVT involving the proximal aspect of the left basilic vein and distal aspect of the left cephalic vein. 3. Note is made of two mildly  enlarged left cervical lymph nodes, nonspecific though potentially reactive in etiology. Clinical correlation to resolution is advised. Electronically Signed   By: Sandi Mariscal M.D.   On: 06/25/2020 09:13   DG Chest [facility] 1 View  Result  Date: 06/24/2020 CLINICAL DATA:  Chest pain, fever. EXAM: PORTABLE CHEST 1 VIEW COMPARISON:  June 22, 2020. FINDINGS: Stable cardiomediastinal silhouette. Mild central pulmonary vascular congestion is noted. No pneumothorax or pleural effusion is noted. Mild bibasilar subsegmental atelectasis is noted. Status post bilateral shoulder arthroplasties. IMPRESSION: Mild bibasilar subsegmental atelectasis. Mild central pulmonary vascular congestion is noted. Aortic Atherosclerosis (ICD10-I70.0). Electronically Signed   By: Marijo Conception M.D.   On: 06/24/2020 08:44    Scheduled Meds: . aspirin  81 mg Oral QODAY  . buPROPion  300 mg Oral Daily  . heparin injection (subcutaneous)  5,000 Units Subcutaneous Q8H  . insulin aspart  0-15 Units Subcutaneous Q4H  . iron polysaccharides  150 mg Oral QHS  . lidocaine  1 patch Transdermal Q24H  . nystatin  5 mL Oral QID  . simvastatin  40 mg Oral QHS  . vitamin B-12  1,000 mcg Oral QPM   Continuous Infusions: . cefTRIAXone (ROCEPHIN)  IV 2 g (06/24/20 1720)  . lactated ringers 75 mL/hr at 06/25/20 0033  . metronidazole 500 mg (06/25/20 1027)     LOS: 3 days    Time spent: 25 mins.    Shawna Clamp, MD [facility]   If 7PM-7AM, please contact night-coverage

## 2020-06-26 LAB — CBC
HCT: 30.2 % — ABNORMAL LOW (ref 36.0–46.0)
Hemoglobin: 9.5 g/dL — ABNORMAL LOW (ref 12.0–15.0)
MCH: 28.9 pg (ref 26.0–34.0)
MCHC: 31.5 g/dL (ref 30.0–36.0)
MCV: 91.8 fL (ref 80.0–100.0)
Platelets: 171 10*3/uL (ref 150–400)
RBC: 3.29 MIL/uL — ABNORMAL LOW (ref 3.87–5.11)
RDW: 13.2 % (ref 11.5–15.5)
WBC: 13.1 10*3/uL — ABNORMAL HIGH (ref 4.0–10.5)
nRBC: 0 % (ref 0.0–0.2)

## 2020-06-26 LAB — COMPREHENSIVE METABOLIC PANEL
ALT: 18 U/L (ref 0–44)
AST: 20 U/L (ref 15–41)
Albumin: 2.7 g/dL — ABNORMAL LOW (ref 3.5–5.0)
Alkaline Phosphatase: 74 U/L (ref 38–126)
Anion gap: 13 (ref 5–15)
BUN: 22 mg/dL (ref 8–23)
CO2: 20 mmol/L — ABNORMAL LOW (ref 22–32)
Calcium: 8.1 mg/dL — ABNORMAL LOW (ref 8.9–10.3)
Chloride: 107 mmol/L (ref 98–111)
Creatinine, Ser: 1.28 mg/dL — ABNORMAL HIGH (ref 0.44–1.00)
GFR calc Af Amer: 49 mL/min — ABNORMAL LOW (ref >60)
GFR calc non Af Amer: 42 mL/min — ABNORMAL LOW (ref >60)
Glucose, Bld: 156 mg/dL — ABNORMAL HIGH (ref 70–99)
Potassium: 4.6 mmol/L (ref 3.5–5.1)
Sodium: 140 mmol/L (ref 135–145)
Total Bilirubin: 2 mg/dL — ABNORMAL HIGH (ref 0.3–1.2)
Total Protein: 5.2 g/dL — ABNORMAL LOW (ref 6.5–8.1)

## 2020-06-26 LAB — GLUCOSE, CAPILLARY
Glucose-Capillary: 158 mg/dL — ABNORMAL HIGH (ref 70–99)
Glucose-Capillary: 142 mg/dL — ABNORMAL HIGH (ref 70–99)
Glucose-Capillary: 125 mg/dL — ABNORMAL HIGH (ref 70–99)

## 2020-06-26 MED ORDER — METHOCARBAMOL 500 MG PO TABS: 500.0000 mg | ORAL_TABLET | Freq: Three times a day (TID) | ORAL | 0 refills | Status: DC | PRN

## 2020-06-26 MED ORDER — OXYCODONE HCL 5 MG PO TABS
5.0000 mg | ORAL_TABLET | ORAL | 0 refills | Status: DC | PRN
Start: 2020-06-26 — End: 2020-07-02

## 2020-06-26 NOTE — Discharge Summary (Addendum)
Physician Discharge Summary  Brenda [name] ZOX:[id number] DOB: Oct 05, 1951 DOA: 06/22/2020  PCP: Ronnald Nian, DO  Admit date: 06/22/2020   Discharge date: 06/26/2020  Admitted From:  Home.  Disposition:  Home with home services.  Recommendations for Outpatient Follow-up:  1. Follow up with PCP in 1-2 weeks. 2. Please obtain BMP/CBC in one week.  3. Advised to follow up Nephrology Dr. Hollie Salk as scheduled.  4. Will request PCP to follow up on SVT in left basilic vein. 5. Advised to take pain medications as needed.  Home Health: Home PT, walker. Equipment/Devices: Home services  Discharge Condition: Stable. CODE STATUS:Full code Diet recommendation: Carb Modified   Brief Summary/ Hospital course: Patient is a 69 year old female with a known history of CLL, hypertension, diabetes mellitus with complications of chronic kidney disease who was brought into the ER by EMS after a fall earlier this morning for evaluation of weakness and dizziness. Patient noted to have acute kidney injury and has a serum creatinine of 4.63 compared to baseline of 1.67. She has multiple rib fractures following the fall and CT scan findings suggestive of possible acute appendicitis. She will be admitted to the hospital for further evaluation.  Patient was admitted for acute on chronic kidney injury, serum creatinine at admission 4.63,  CT scan findings suggestive of acute appendicitis. General surgery was consulted, recommended no surgical intervention,  finding does not consistent with acute appendicitis. Patient was continued on IV fluids for acute kidney injury,  Nephrology was consulted,  Patient was continued on IV fluids,  serum creatinine continues to get better, serum creatinine was 1.23 on date of discharge which was normal than her prior baseline. Given fall she has pain in her left chest because of multiple rib fractures so she was given pain medications. Patient was cleared from nephrology to be  discharged, she will follow-up with Dr. Hollie Salk from [facility] , Patient continues to have fever,  chest x-ray was normal UA was negative, So far cultures are negative. Stool for C. difficile negative. venous duplex upper extremity was done which shows superficial venous thrombosis in the left basilic vein which will be followed outpatient with serial exams. Patient remained without fever for 24 hours before discharge. She has completed antibiotics for 5 days.  PT recommended skilled nursing facility but patient refused. PT recommended home with home services 24-hour supervision. Case management notified. Health services arranged.  She was managed for below problems.  Discharge Diagnoses:  Principal Problem:   AKI (acute kidney injury) ([place]) Active Problems:   Hyperlipidemia   Depression with anxiety   Essential hypertension   Generalized anxiety disorder   Hypotension   CLL (chronic lymphocytic leukemia) (HCC)   CKD (chronic kidney disease) stage 3, GFR 30-59 ml/min   Fall at home, initial encounter   Appendicitis   Fever  1. Fever of 101. So far cultures are negative. Stool for C. difficile negative. Patient on Rocephin and Flagyl but not quite sure what we are treating at this point. Repeat chest x-ray does not show aspiration. Had general surgery see againand they still do not think this is appendicitis. Stool comprehensive panel pending. Left upper extremity swelling : venous duplex negative for DVT but shows SVT.      Patient remained without fever for 24 hours before discharge.  2. Acute kidney injury. >>> Improving.Creatinine 4.63 on presentation down to 1.23. Decrease rate of IV fluids.  3. Hypotension has resolved. Continue to hold antihypertensive medications for right now.Discontinue midodrine.  4. Acute hypoxic respiratory failure. This has resolved. Now on room air.  5. 3 fractures on the left side after fall. Pain control with oral oxycodone.  Incentive spirometry.        Physical therapy recommends rehab but patient would like to go home.  6. 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 Patient would like to follow-up with her oncologist Dr. Velvet Bathe as outpatient.  7. Depression and anxiety on Wellbutrin.  8. Hyperlipidemia unspecified on simvastatin    Discharge Instructions  Discharge Instructions    Call MD for:  difficulty breathing, headache or visual disturbances   Complete by: As directed    Call MD for:  persistant dizziness or light-headedness   Complete by: As directed    Call MD for:  persistant nausea and vomiting   Complete by: As directed    Call MD for:  severe uncontrolled pain   Complete by: As directed    Call MD for:  temperature >100.4   Complete by: As directed    Diet - low sodium heart healthy   Complete by: As directed    Diet Carb Modified   Complete by: As directed    Discharge instructions   Complete by: As directed    Advised to follow up  PCP in one week,  Advised to follow up Nephrology Dr. Hollie Salk as scheduled.  Will request PCP to follow up on SVT in left basilic vein. Advised to take pain medications as needed.   Increase activity slowly   Complete by: As directed      Allergies as of 06/26/2020      Reactions   Amlodipine    Swelling    Nsaids    Due to kidney function      Medication List    STOP taking these medications   benazepril-hydrochlorthiazide 20-12.5 MG tablet Commonly known as: LOTENSIN HCT   OneTouch Delica Lancets 79T Misc     TAKE these medications   acetaminophen 500 MG tablet Commonly known as: TYLENOL Take 500-1,000 mg by mouth every 6 (six) hours as needed for mild pain, moderate pain or fever.   ALPRAZolam 1 MG tablet Commonly known as: XANAX Take 0.5-1 tablets (0.5-1 mg total) by mouth 2 (two) times daily as needed. for anxiety   aspirin 81 MG tablet Take 81 mg by mouth every other day.   B-12 1000 MCG Subl Place 1,000  mcg under the tongue every evening.   buPROPion 300 MG 24 hr tablet Commonly known as: WELLBUTRIN XL Take 1 tablet (300 mg total) by mouth daily.   doxazosin 2 MG tablet Commonly known as: CARDURA TAKE 1 TABLET(2 MG) BY MOUTH DAILY What changed: See the new instructions.   esomeprazole 20 MG capsule Commonly known as: NEXIUM Take 20 mg by mouth daily as needed (acid reflux symptoms).   ezetimibe 10 MG tablet Commonly known as: ZETIA Take 1 tablet (10 mg total) by mouth daily. What changed:   how much to take  when to take this   gabapentin 300 MG capsule Commonly known as: NEURONTIN Take 1 capsule (300 mg total) by mouth at bedtime.   glipiZIDE 2.5 MG 24 hr tablet Commonly known as: GLUCOTROL XL Take 2.5 mg by mouth 2 (two) times daily with a meal.   Insulin Pen Needle 32G X 4 MM Misc Use 1x a day   iron polysaccharides 150 MG capsule Commonly known as: NIFEREX Take 150 mg by mouth at bedtime.   Lancing Device Misc  Use as advised - Freestyle   Lantus SoloStar 100 UNIT/ML Solostar Pen Generic drug: insulin glargine Inject 22-24 Units into the skin at bedtime. What changed: how much to take   methocarbamol 500 MG tablet Commonly known as: ROBAXIN Take 1 tablet (500 mg total) by mouth every 8 (eight) hours as needed for muscle spasms.   metoprolol tartrate 25 MG tablet Commonly known as: LOPRESSOR TAKE 1 TABLET BY MOUTH ONCE DAILY   NONFORMULARY OR COMPOUNDED ITEM Apply 1 application topically 2 (two) times daily as needed (sun damage on face). FLUOROURACIL 5% + CALCIPOTRIENE 0.005%   OneTouch Verio test strip Generic drug: glucose blood Use to check blood sugar 3 times a day.   OneTouch Verio w/Device Kit Use to check blood sugar 3 times a day   oxyCODONE 5 MG immediate release tablet Commonly known as: Oxy IR/ROXICODONE Take 1 tablet (5 mg total) by mouth every 4 (four) hours as needed for moderate pain or severe pain.   simvastatin 40 MG  tablet Commonly known as: ZOCOR Take 1 tablet (40 mg total) by mouth at bedtime.   triamcinolone cream 0.1 % Commonly known as: KENALOG Apply 1 application topically 2 (two) times daily as needed (skin rash.).   Vitamin D3 50 MCG (2000 UT) Tabs Take 2,000 mcg by mouth daily.            Durable Medical Equipment  (From admission, onward)         Start     Ordered   06/24/20 1540  For home use only DME Walker rolling  Once       Question Answer Comment  Walker: With Lorenzo Wheels   Patient needs a walker to treat with the following condition Weakness      06/24/20 1539   06/24/20 1540  For home use only DME Shower stool  Once        06/24/20 1539          Follow-up Information    Cirigliano, Stanton Kidney K, DO Follow up in 1 week(s).   Specialty: Family Medicine Contact information: [place] [address] [phone]        Minna Merritts, MD .   Specialty: Cardiology Contact information: [place] [address] [phone]        Madelon Lips, MD Follow up in 1 week(s).   Specialty: Nephrology Contact information: [address] [place] [address] [phone]              Allergies  Allergen Reactions  . Amlodipine     Swelling    . Nsaids     Due to kidney function    Consultations:  Nephrology, physical therapy, oncology., General surgery   Procedures/Studies: CT ABDOMEN PELVIS WO CONTRAST  Result Date: 06/22/2020 CLINICAL DATA:  Fall with left flank and rib injury with bruising. Weakness and dizziness. EXAM: CT CHEST, ABDOMEN AND PELVIS WITHOUT CONTRAST TECHNIQUE: Multidetector CT imaging of the chest, abdomen and pelvis was performed following the standard protocol without IV contrast. COMPARISON:  Chest radiograph from earlier today. History of lymphoma. FINDINGS: CT CHEST FINDINGS Cardiovascular: Stable mild cardiomegaly. Stable trace pericardial effusion/thickening.  Three-vessel coronary atherosclerosis. Atherosclerotic nonaneurysmal thoracic aorta. No acute intramural hematoma in the thoracic aorta. Normal caliber pulmonary arteries. Mediastinum/Nodes: No pneumomediastinum. No mediastinal hematoma. No discrete thyroid nodules. Unremarkable esophagus. Moderate bilateral axillary/retropectoral lymphadenopathy, increased. Representative 1.9 cm left axillary node (series 2/image 10), increased from 1.2 cm on  05/01/2018 CT. Representative 1.9 cm right axillary node (series 2/image 18), increased from 1.5 cm. Enlarged 1.9 cm subcarinal node (series 2/image 25), increased from 1.5 cm. New mild bilateral paratracheal adenopathy up to 1.1 cm on the left (series 2/image 16). No discrete hilar adenopathy on these noncontrast images. Lungs/Pleura: No pneumothorax. No pleural effusion. No acute consolidative airspace disease or lung masses. Mild platelike atelectasis at the dependent lung bases bilaterally. Numerous (greater than 20) solid pulmonary nodules scattered throughout both lungs, mildly increased. Representative 0.9 cm medial right middle lobe nodule (series 4/image 73), increased from 0.7 cm. Representative 0.7 cm anterior right lower lobe nodule (series 4/image 65), increased from 0.6 cm. Representative 0.4 cm superior segment left lower lobe nodule (series 4/image 55), previously 0.3 cm. Musculoskeletal: No aggressive appearing focal osseous lesions. Acute nondisplaced anterior left fifth, sixth, seventh and eighth rib fractures. Marked thoracic spondylosis. Bilateral shoulder arthroplasties, partially visualized. CT ABDOMEN PELVIS FINDINGS Hepatobiliary: Normal liver with no liver mass. Cholelithiasis. No biliary ductal dilatation. Pancreas: Normal, with no laceration, mass or duct dilation. Spleen: Normal size spleen.  No splenic mass. Adrenals/Urinary Tract: Normal adrenals. No hydronephrosis. No contour deforming renal masses. No renal stones. Normal bladder.  Stomach/Bowel: Grossly normal stomach. Normal caliber small bowel with no small bowel wall thickening. Diffusely dilated and thick-walled appendix (14 mm diameter) with prominent periappendiceal fat stranding adjacent to the mid to distal appendix, suggesting acute appendicitis. No appendicolith. No periappendiceal free air or measurable collection. Normal large bowel with no diverticulosis, large bowel wall thickening or pericolonic fat stranding. Vascular/Lymphatic: Atherosclerotic nonaneurysmal abdominal aorta. Porta hepatis, aortocaval, left para-aortic and bulky bilateral iliac lymphadenopathy is increased. Representative 2.1 cm porta hepatis node (series 2/image 50), previously 1.9 cm. Representative 1.7 cm left para-aortic node (series 2/image 74), previously 1.2 cm. Representative 4.0 cm left external iliac node (series 2/image 99), previously 3.3 cm. Reproductive: Status post hysterectomy, with no abnormal findings at the vaginal cuff. No adnexal mass. Other: No pneumoperitoneum, ascites or focal fluid collection. Musculoskeletal: No aggressive appearing focal osseous lesions. No fracture in the abdomen or pelvis. Partially visualized fixation hardware in the proximal left femur. Marked lumbar spondylosis. IMPRESSION: 1. Acute nondisplaced anterior left fifth, sixth, seventh and eighth rib fractures. No pneumothorax. 2. No acute traumatic injury in the abdomen or pelvis. 3. CT findings suggestive of acute appendicitis. No free air or measurable abscess on this noncontrast scan. Surgical consultation advised. 4. Widespread lymphadenopathy, increased since 05/01/2018 CT as detailed, compatible with progression of lymphoma. 5. Numerous nonspecific chronic solid pulmonary nodules scattered throughout both lungs, mildly increased, also potentially due to lymphoma. 6. Stable mild cardiomegaly. Three-vessel coronary atherosclerosis. 7. Cholelithiasis. 8. Aortic Atherosclerosis (ICD10-I70.0). These results were  called by telephone at the time of interpretation on 06/22/2020 at 11:20 am to provider Lavonia Drafts, who verbally acknowledged these results. Electronically Signed   By: Ilona Sorrel M.D.   On: 06/22/2020 11:23   CT Chest Wo Contrast  Result Date: 06/22/2020 CLINICAL DATA:  Fall with left flank and rib injury with bruising. Weakness and dizziness. EXAM: CT CHEST, ABDOMEN AND PELVIS WITHOUT CONTRAST TECHNIQUE: Multidetector CT imaging of the chest, abdomen and pelvis was performed following the standard protocol without IV contrast. COMPARISON:  Chest radiograph from earlier today. History of lymphoma. FINDINGS: CT CHEST FINDINGS Cardiovascular: Stable mild cardiomegaly. Stable trace pericardial effusion/thickening. Three-vessel coronary atherosclerosis. Atherosclerotic nonaneurysmal thoracic aorta. No acute intramural hematoma in the thoracic aorta. Normal caliber pulmonary arteries. Mediastinum/Nodes: No pneumomediastinum. No mediastinal hematoma. No  discrete thyroid nodules. Unremarkable esophagus. Moderate bilateral axillary/retropectoral lymphadenopathy, increased. Representative 1.9 cm left axillary node (series 2/image 10), increased from 1.2 cm on 05/01/2018 CT. Representative 1.9 cm right axillary node (series 2/image 18), increased from 1.5 cm. Enlarged 1.9 cm subcarinal node (series 2/image 25), increased from 1.5 cm. New mild bilateral paratracheal adenopathy up to 1.1 cm on the left (series 2/image 16). No discrete hilar adenopathy on these noncontrast images. Lungs/Pleura: No pneumothorax. No pleural effusion. No acute consolidative airspace disease or lung masses. Mild platelike atelectasis at the dependent lung bases bilaterally. Numerous (greater than 20) solid pulmonary nodules scattered throughout both lungs, mildly increased. Representative 0.9 cm medial right middle lobe nodule (series 4/image 73), increased from 0.7 cm. Representative 0.7 cm anterior right lower lobe nodule (series 4/image  65), increased from 0.6 cm. Representative 0.4 cm superior segment left lower lobe nodule (series 4/image 55), previously 0.3 cm. Musculoskeletal: No aggressive appearing focal osseous lesions. Acute nondisplaced anterior left fifth, sixth, seventh and eighth rib fractures. Marked thoracic spondylosis. Bilateral shoulder arthroplasties, partially visualized. CT ABDOMEN PELVIS FINDINGS Hepatobiliary: Normal liver with no liver mass. Cholelithiasis. No biliary ductal dilatation. Pancreas: Normal, with no laceration, mass or duct dilation. Spleen: Normal size spleen.  No splenic mass. Adrenals/Urinary Tract: Normal adrenals. No hydronephrosis. No contour deforming renal masses. No renal stones. Normal bladder. Stomach/Bowel: Grossly normal stomach. Normal caliber small bowel with no small bowel wall thickening. Diffusely dilated and thick-walled appendix (14 mm diameter) with prominent periappendiceal fat stranding adjacent to the mid to distal appendix, suggesting acute appendicitis. No appendicolith. No periappendiceal free air or measurable collection. Normal large bowel with no diverticulosis, large bowel wall thickening or pericolonic fat stranding. Vascular/Lymphatic: Atherosclerotic nonaneurysmal abdominal aorta. Porta hepatis, aortocaval, left para-aortic and bulky bilateral iliac lymphadenopathy is increased. Representative 2.1 cm porta hepatis node (series 2/image 50), previously 1.9 cm. Representative 1.7 cm left para-aortic node (series 2/image 74), previously 1.2 cm. Representative 4.0 cm left external iliac node (series 2/image 99), previously 3.3 cm. Reproductive: Status post hysterectomy, with no abnormal findings at the vaginal cuff. No adnexal mass. Other: No pneumoperitoneum, ascites or focal fluid collection. Musculoskeletal: No aggressive appearing focal osseous lesions. No fracture in the abdomen or pelvis. Partially visualized fixation hardware in the proximal left femur. Marked lumbar  spondylosis. IMPRESSION: 1. Acute nondisplaced anterior left fifth, sixth, seventh and eighth rib fractures. No pneumothorax. 2. No acute traumatic injury in the abdomen or pelvis. 3. CT findings suggestive of acute appendicitis. No free air or measurable abscess on this noncontrast scan. Surgical consultation advised. 4. Widespread lymphadenopathy, increased since 05/01/2018 CT as detailed, compatible with progression of lymphoma. 5. Numerous nonspecific chronic solid pulmonary nodules scattered throughout both lungs, mildly increased, also potentially due to lymphoma. 6. Stable mild cardiomegaly. Three-vessel coronary atherosclerosis. 7. Cholelithiasis. 8. Aortic Atherosclerosis (ICD10-I70.0). These results were called by telephone at the time of interpretation on 06/22/2020 at 11:20 am to provider Lavonia Drafts, who verbally acknowledged these results. Electronically Signed   By: Ilona Sorrel M.D.   On: 06/22/2020 11:23   US Venous Img Upper Uni Left (DVT)  Result Date: 06/25/2020 CLINICAL DATA:  Left upper extremity swelling for the past day. Evaluate for DVT. EXAM: LEFT UPPER EXTREMITY VENOUS DOPPLER ULTRASOUND TECHNIQUE: Gray-scale sonography with graded compression, as well as color Doppler and duplex ultrasound were performed to evaluate the upper extremity deep venous system from the level of the subclavian vein and including the jugular, axillary, basilic, radial, ulnar and upper cephalic  vein. Spectral Doppler was utilized to evaluate flow at rest and with distal augmentation maneuvers. COMPARISON:  None. FINDINGS: Contralateral Subclavian Vein: Respiratory phasicity is normal and symmetric with the symptomatic side. No evidence of thrombus. Normal compressibility. Internal Jugular Vein: No evidence of thrombus. Normal compressibility, respiratory phasicity and response to augmentation. Subclavian Vein: No evidence of thrombus. Normal compressibility, respiratory phasicity and response to augmentation.  Axillary Vein: No evidence of thrombus. Normal compressibility, respiratory phasicity and response to augmentation. Cephalic Vein: While the proximal and mid aspects of the cephalic vein remains patent (images 12 and 13), there is mixed echogenic occlusive thrombus within distal aspect of the left cephalic vein (images 14, 16 and 17). Basilic Vein: There is short-segment hypoechoic occlusive thrombus involving the proximal aspect of the left basilic vein (images 20 and 25). The filling vein appears patent at its mid (image 21 and distal (image 22) aspects. Brachial Veins: No evidence of thrombus. Normal compressibility, respiratory phasicity and response to augmentation. Radial Veins: No evidence of thrombus. Normal compressibility, respiratory phasicity and response to augmentation. Ulnar Veins: No evidence of thrombus. Normal compressibility, respiratory phasicity and response to augmentation. Venous Reflux:  None visualized. Other Findings: Note is made of two prominent left cervical lymph nodes the largest of which measures approximately 1.2 cm in greatest short axis diameter. IMPRESSION: 1. No evidence of DVT within the left upper extremity. 2. Examination is positive for occlusive SVT involving the proximal aspect of the left basilic vein and distal aspect of the left cephalic vein. 3. Note is made of two mildly enlarged left cervical lymph nodes, nonspecific though potentially reactive in etiology. Clinical correlation to resolution is advised. Electronically Signed   By: Sandi Mariscal M.D.   On: 06/25/2020 09:13   DG Chest Port 1 View  Result Date: 06/24/2020 CLINICAL DATA:  Chest pain, fever. EXAM: PORTABLE CHEST 1 VIEW COMPARISON:  June 22, 2020. FINDINGS: Stable cardiomediastinal silhouette. Mild central pulmonary vascular congestion is noted. No pneumothorax or pleural effusion is noted. Mild bibasilar subsegmental atelectasis is noted. Status post bilateral shoulder arthroplasties. IMPRESSION: Mild  bibasilar subsegmental atelectasis. Mild central pulmonary vascular congestion is noted. Aortic Atherosclerosis (ICD10-I70.0). Electronically Signed   By: Marijo Conception M.D.   On: 06/24/2020 08:44   DG Chest Port 1 View  Result Date: 06/22/2020 CLINICAL DATA:  69 year old female with history of weakness for the past 3 weeks and dizziness for the past 24 hours. EXAM: PORTABLE CHEST 1 VIEW COMPARISON:  Chest x-ray 10/24/2012. FINDINGS: Lung volumes are low. No consolidative airspace disease. No pleural effusions. No pneumothorax. No pulmonary nodule or mass noted. Pulmonary vasculature and the cardiomediastinal silhouette are within normal limits. Atherosclerosis in the thoracic aorta. Status post bilateral shoulder arthroplasty. IMPRESSION: 1. Low lung volumes without radiographic evidence of acute cardiopulmonary disease. 2. Aortic atherosclerosis. Electronically Signed   By: Vinnie Langton M.D.   On: 06/22/2020 09:09      Subjective: Patient was seen and examined, No overnight events, She feels better and wants to be discharged. She refused to have xray abdomen. She ambulated in the room with physical therapy,  reports pain is better.  Discharge Exam: Vitals:   06/26/20 0823 06/26/20 1111  BP: 139/60 (!) 153/82  Pulse: (!) 106 (!) 110  Resp: 18 18  Temp: 98.6 F (37 C) 99.2 F (37.3 C)  SpO2: 96% 97%   Vitals:   06/25/20 2024 06/26/20 0411 06/26/20 0823 06/26/20 1111  BP: (!) 155/79 (!) 154/78 139/60 (!) 153/82  Pulse: (!) 113 (!) 113 (!) 106 (!) 110  Resp: _0 Temp: 98.7 F (37.1 C) 98.2 F (36.8 C) 98.6 F (37 C) 99.2 F (37.3 C)  TempSrc: Oral Oral Oral Oral  SpO2: 91% 98% 96% 97%  Weight:  86.2 kg    Height:        General: Pt is alert, awake, not in acute distress Cardiovascular: RRR, S1/S2 +, no rubs, no gallops Respiratory: CTA bilaterally, no wheezing, no rhonchi Abdominal: Soft, NT, ND, bowel sounds + Extremities: no edema, no cyanosis    The  results of significant diagnostics from this hospitalization (including imaging, microbiology, ancillary and laboratory) are listed below for reference.     Microbiology: Recent Results (from the past 240 hour(s))  SARS Coronavirus 2 by RT PCR (hospital order, performed in [facility] hospital lab) Nasopharyngeal Nasopharyngeal Swab     Status: None   Collection Time: 06/22/20 11:44 AM   Specimen: Nasopharyngeal Swab  Result Value Ref Range Status   SARS Coronavirus 2 NEGATIVE NEGATIVE Final    Comment: (NOTE) SARS-CoV-2 target nucleic acids are NOT DETECTED.  The SARS-CoV-2 RNA is generally detectable in upper and lower respiratory specimens during the acute phase of infection. The lowest concentration of SARS-CoV-2 viral copies this assay can detect is 250 copies / mL. A negative result does not preclude SARS-CoV-2 infection and should not be used as the sole basis for treatment or other patient management decisions.  A negative result may occur with improper specimen collection / handling, submission of specimen other than nasopharyngeal swab, presence of viral mutation(s) within the areas targeted by this assay, and inadequate number of viral copies (<250 copies / mL). A negative result must be combined with clinical observations, patient history, and epidemiological information.  Fact Sheet for Patients:   StrictlyIdeas.no  Fact Sheet for Healthcare Providers: BankingDealers.co.za  This test is not yet approved or  cleared by the [place] FDA and has been authorized for detection and/or diagnosis of SARS-CoV-2 by FDA under an Emergency Use Authorization (EUA).  This EUA will remain in effect (meaning this test can be used) for the duration of the COVID-19 declaration under Section 564(b)(1) of the Act, 21 U.S.C. section 360bbb-3(b)(1), unless the authorization is terminated or revoked sooner.  Performed at [facility], [address] [place], [place] [address]   Culture, blood (x 2)     Status: None (Preliminary result)   Collection Time: 06/23/20  1:35 AM   Specimen: BLOOD  Result Value Ref Range Status   Specimen Description BLOOD BLOOD RIGHT HAND  Final   Special Requests   Final    BOTTLES DRAWN AEROBIC AND ANAEROBIC Blood Culture adequate volume   Culture   Final    NO GROWTH 3 DAYS Performed at [facility], [address], [place], [place] [address]    Report Status PENDING  Incomplete  Culture, blood (x 2)     Status: None (Preliminary result)   Collection Time: 06/23/20  2:30 AM   Specimen: BLOOD  Result Value Ref Range Status   Specimen Description BLOOD BLOOD RIGHT HAND  Final   Special Requests   Final    BOTTLES DRAWN AEROBIC AND ANAEROBIC Blood Culture adequate volume   Culture   Final    NO GROWTH 3 DAYS Performed at [facility], [address], [place], [place] [address]    Report Status PENDING  Incomplete  C Difficile Quick Screen w  PCR reflex     Status: None   Collection Time: 06/24/20  4:25 PM   Specimen: STOOL  Result Value Ref Range Status   C Diff antigen NEGATIVE NEGATIVE Final   C Diff toxin NEGATIVE NEGATIVE Final   C Diff interpretation No C. difficile detected.  Final    Comment: Performed at [facility], [address] [place], [place] [address]  Gastrointestinal Panel by PCR , Stool     Status: None   Collection Time: 06/24/20  4:25 PM   Specimen: Stool  Result Value Ref Range Status   Campylobacter species NOT DETECTED NOT DETECTED Final   Plesimonas shigelloides NOT DETECTED NOT DETECTED Final   Salmonella species NOT DETECTED NOT DETECTED Final   Yersinia enterocolitica NOT DETECTED NOT DETECTED Final   Vibrio species NOT DETECTED NOT DETECTED Final   Vibrio cholerae NOT DETECTED NOT DETECTED Final   Enteroaggregative E coli (EAEC) NOT DETECTED NOT DETECTED Final   Enteropathogenic E coli (EPEC) NOT  DETECTED NOT DETECTED Final   Enterotoxigenic E coli (ETEC) NOT DETECTED NOT DETECTED Final   Shiga like toxin producing E coli (STEC) NOT DETECTED NOT DETECTED Final   Shigella/Enteroinvasive E coli (EIEC) NOT DETECTED NOT DETECTED Final   Cryptosporidium NOT DETECTED NOT DETECTED Final   Cyclospora cayetanensis NOT DETECTED NOT DETECTED Final   Entamoeba histolytica NOT DETECTED NOT DETECTED Final   Giardia lamblia NOT DETECTED NOT DETECTED Final   Adenovirus F40/41 NOT DETECTED NOT DETECTED Final   Astrovirus NOT DETECTED NOT DETECTED Final   Norovirus GI/GII NOT DETECTED NOT DETECTED Final   Rotavirus A NOT DETECTED NOT DETECTED Final   Sapovirus (I, II, IV, and V) NOT DETECTED NOT DETECTED Final    Comment: Performed at [facility], [address] [place], [place] [address]     Labs: BNP (last 3 results) No results for input(s): BNP in the last 8760 hours. Basic Metabolic Panel: Recent Labs  Lab 06/22/20 1622 06/22/20 2330 06/23/20 0919 06/24/20 1006 06/26/20 0543  NA 135 133* 138 137 140  K 3.1* 3.6 4.5 4.1 4.6  CL 107 105 108 106 107  CO2 17* 15* 22 21* 20*  GLUCOSE 41* 202* 152* 97 156*  BUN 62* 67* 56* 43* 22  CREATININE 3.05* 3.60* 2.70* 2.12* 1.28*  CALCIUM 6.2* 7.2* 7.4* 7.9* 8.1*   Liver Function Tests: Recent Labs  Lab 06/22/20 0845 06/22/20 2330 06/26/20 0543  AST _0 ALT _1 ALKPHOS 71 67 74  BILITOT 0.9 0.9 2.0*  PROT 5.8* 5.2* 5.2*  ALBUMIN 3.4* 3.0* 2.7*   No results for input(s): LIPASE, AMYLASE in the last 168 hours. No results for input(s): AMMONIA in the last 168 hours. CBC: Recent Labs  Lab 06/22/20 0845 06/22/20 2330 06/23/20 0919 06/24/20 1006 06/26/20 0543  WBC 15.4* 13.7* 15.4*  15.4* 10.8* 13.1*  NEUTROABS  --   --  10.6*  --   --   HGB 10.9* 9.7* 10.0*  9.8* 9.9* 9.5*  HCT 33.8* 28.8* 31.7*  30.8* 30.3* 30.2*  MCV 88.9 87.0 91.4  90.9 88.6 91.8  PLT 160 160 147*  147* 141* 171   Cardiac  Enzymes: Recent Labs  Lab 06/22/20 0845  CKTOTAL 209   BNP: Invalid input(s): POCBNP CBG: Recent Labs  Lab 06/25/20 1552 06/25/20 2025 06/26/20 0413 06/26/20 0825 06/26/20 1111  GLUCAP 97 111* 158* 142* 125*   D-Dimer No results for input(s): DDIMER in the last  72 hours. Hgb A1c No results for input(s): HGBA1C in the last 72 hours. Lipid Profile No results for input(s): CHOL, HDL, LDLCALC, TRIG, CHOLHDL, LDLDIRECT in the last 72 hours. Thyroid function studies No results for input(s): TSH, T4TOTAL, T3FREE, THYROIDAB in the last 72 hours.  Invalid input(s): FREET3 Anemia work up No results for input(s): VITAMINB12, FOLATE, FERRITIN, TIBC, IRON, RETICCTPCT in the last 72 hours. Urinalysis    Component Value Date/Time   COLORURINE YELLOW (A) 06/22/2020 1144   APPEARANCEUR CLOUDY (A) 06/22/2020 1144   LABSPEC 1.014 06/22/2020 1144   PHURINE 5.0 06/22/2020 1144   GLUCOSEU NEGATIVE 06/22/2020 1144   HGBUR MODERATE (A) 06/22/2020 1144   BILIRUBINUR NEGATIVE 06/22/2020 1144   KETONESUR NEGATIVE 06/22/2020 1144   PROTEINUR NEGATIVE 06/22/2020 1144   UROBILINOGEN 0.2 10/24/2012 2009   NITRITE NEGATIVE 06/22/2020 1144   LEUKOCYTESUR NEGATIVE 06/22/2020 1144   Sepsis Labs Invalid input(s): PROCALCITONIN,  WBC,  LACTICIDVEN Microbiology Recent Results (from the past 240 hour(s))  SARS Coronavirus 2 by RT PCR (hospital order, performed in [place] hospital lab) Nasopharyngeal Nasopharyngeal Swab     Status: None   Collection Time: 06/22/20 11:44 AM   Specimen: Nasopharyngeal Swab  Result Value Ref Range Status   SARS Coronavirus 2 NEGATIVE NEGATIVE Final    Comment: (NOTE) SARS-CoV-2 target nucleic acids are NOT DETECTED.  The SARS-CoV-2 RNA is generally detectable in upper and lower respiratory specimens during the acute phase of infection. The lowest concentration of SARS-CoV-2 viral copies this assay can detect is 250 copies / mL. A negative result does not  preclude SARS-CoV-2 infection and should not be used as the sole basis for treatment or other patient management decisions.  A negative result may occur with improper specimen collection / handling, submission of specimen other than nasopharyngeal swab, presence of viral mutation(s) within the areas targeted by this assay, and inadequate number of viral copies (<250 copies / mL). A negative result must be combined with clinical observations, patient history, and epidemiological information.  Fact Sheet for Patients:   StrictlyIdeas.no  Fact Sheet for Healthcare Providers: BankingDealers.co.za  This test is not yet approved or  cleared by the [place] FDA and has been authorized for detection and/or diagnosis of SARS-CoV-2 by FDA under an Emergency Use Authorization (EUA).  This EUA will remain in effect (meaning this test can be used) for the duration of the COVID-19 declaration under Section 564(b)(1) of the Act, 21 U.S.C. section 360bbb-3(b)(1), unless the authorization is terminated or revoked sooner.  Performed at [facility], [address] [place], [place] [address]   Culture, blood (x 2)     Status: None (Preliminary result)   Collection Time: 06/23/20  1:35 AM   Specimen: BLOOD  Result Value Ref Range Status   Specimen Description BLOOD BLOOD RIGHT HAND  Final   Special Requests   Final    BOTTLES DRAWN AEROBIC AND ANAEROBIC Blood Culture adequate volume   Culture   Final    NO GROWTH 3 DAYS Performed at [facility], [address] [place], [place] [address]    Report Status PENDING  Incomplete  Culture, blood (x 2)     Status: None (Preliminary result)   Collection Time: 06/23/20  2:30 AM   Specimen: BLOOD  Result Value Ref Range Status   Specimen Description BLOOD BLOOD RIGHT HAND  Final   Special Requests   Final    BOTTLES DRAWN AEROBIC AND ANAEROBIC Blood Culture adequate volume  Culture   Final    NO GROWTH 3 DAYS Performed at [facility], [address] [place], [place] [address]    Report Status PENDING  Incomplete  C Difficile Quick Screen w PCR reflex     Status: None   Collection Time: 06/24/20  4:25 PM   Specimen: STOOL  Result Value Ref Range Status   C Diff antigen NEGATIVE NEGATIVE Final   C Diff toxin NEGATIVE NEGATIVE Final   C Diff interpretation No C. difficile detected.  Final    Comment: Performed at [facility], [address] [place], [place] [address]  Gastrointestinal Panel by PCR , Stool     Status: None   Collection Time: 06/24/20  4:25 PM   Specimen: Stool  Result Value Ref Range Status   Campylobacter species NOT DETECTED NOT DETECTED Final   Plesimonas shigelloides NOT DETECTED NOT DETECTED Final   Salmonella species NOT DETECTED NOT DETECTED Final   Yersinia enterocolitica NOT DETECTED NOT DETECTED Final   Vibrio species NOT DETECTED NOT DETECTED Final   Vibrio cholerae NOT DETECTED NOT DETECTED Final   Enteroaggregative E coli (EAEC) NOT DETECTED NOT DETECTED Final   Enteropathogenic E coli (EPEC) NOT DETECTED NOT DETECTED Final   Enterotoxigenic E coli (ETEC) NOT DETECTED NOT DETECTED Final   Shiga like toxin producing E coli (STEC) NOT DETECTED NOT DETECTED Final   Shigella/Enteroinvasive E coli (EIEC) NOT DETECTED NOT DETECTED Final   Cryptosporidium NOT DETECTED NOT DETECTED Final   Cyclospora cayetanensis NOT DETECTED NOT DETECTED Final   Entamoeba histolytica NOT DETECTED NOT DETECTED Final   Giardia lamblia NOT DETECTED NOT DETECTED Final   Adenovirus F40/41 NOT DETECTED NOT DETECTED Final   Astrovirus NOT DETECTED NOT DETECTED Final   Norovirus GI/GII NOT DETECTED NOT DETECTED Final   Rotavirus A NOT DETECTED NOT DETECTED Final   Sapovirus (I, II, IV, and V) NOT DETECTED NOT DETECTED Final    Comment: Performed at [facility], [address], [place], [place] [address]     Time  coordinating discharge: Over 30 minutes  SIGNED:   Shawna Clamp, MD  [facility] 06/26/2020, 12:50 PM Pager   If 7PM-7AM, please contact night-coverage [email or web address]

## 2020-06-26 NOTE — Discharge Instructions (Signed)
Advised to follow up  PCP in one week,  Advised to follow up Nephrology Dr. Hollie Salk as scheduled.  Will request PCP to follow up on SVT in left basilic vein. Advised to take pain medications as needed. She has completed course of antibiotics, Appendicitis was ruled out.

## 2020-06-26 NOTE — Progress Notes (Signed)
[facility]  ROUNDING NOTE   Subjective:  Renal function much improved. Cr down to 1.28. In good spirits. Did receive oxycodone yesterday.   Objective:  Vital signs in last 24 hours:  Temp:  [98.2 F (36.8 C)-99.2 F (37.3 C)] 99.2 F (37.3 C) (08/26 1111) Pulse Rate:  [106-113] 110 (08/26 1111) Resp:  [18-20] 18 (08/26 1111) BP: (139-155)/(60-82) 153/82 (08/26 1111) SpO2:  [91 %-98 %] 97 % (08/26 1111) Weight:  [86.2 kg] 86.2 kg (08/26 0411)  Weight change:  Filed Weights   06/23/20 2155 06/24/20 0342 06/26/20 0411  Weight: 92.1 kg 89.2 kg 86.2 kg    Intake/Output: I/O last 3 completed shifts: In: 1447.6 [I.V.:750; IV Piggyback:697.6] Out: -    Intake/Output this shift:  Total I/O In: 240 [P.O.:240] Out: -   Physical Exam: General:   In no acute distress  Head:   Normocephalic,has [place] with O2 on flow  Eyes:   Sclerae and conjunctivae clear  Neck:   Trachea at midline  Lungs:    Normal and symmetrical respiratory effort,Lungs clear  Heart:   Regular rate and rhythm  Abdomen:    Soft, nontender,  Non distended  Extremities:   No peripheral edema.  Neurologic:   Awake, alert, oriented  Skin:  Abrasions noted on Rt side of the face and lip        Basic Metabolic Panel: Recent Labs  Lab 06/22/20 1622 06/22/20 1622 06/22/20 2330 06/22/20 2330 06/23/20 0919 06/24/20 1006 06/26/20 0543  NA 135  --  133*  --  138 137 140  K 3.1*  --  3.6  --  4.5 4.1 4.6  CL 107  --  105  --  108 106 107  CO2 17*  --  15*  --  22 21* 20*  GLUCOSE 41*  --  202*  --  152* 97 156*  BUN 62*  --  67*  --  56* 43* 22  CREATININE 3.05*  --  3.60*  --  2.70* 2.12* 1.28*  CALCIUM 6.2*   < > 7.2*   < > 7.4* 7.9* 8.1*   < > = values in this interval not displayed.    Liver Function Tests: Recent Labs  Lab 06/22/20 0845 06/22/20 2330 06/26/20 0543  AST _0 ALT _1 ALKPHOS 71 67 74  BILITOT 0.9 0.9 2.0*  PROT 5.8* 5.2* 5.2*  ALBUMIN 3.4* 3.0* 2.7*    No results for input(s): LIPASE, AMYLASE in the last 168 hours. No results for input(s): AMMONIA in the last 168 hours.  CBC: Recent Labs  Lab 06/22/20 0845 06/22/20 2330 06/23/20 0919 06/24/20 1006 06/26/20 0543  WBC 15.4* 13.7* 15.4*  15.4* 10.8* 13.1*  NEUTROABS  --   --  10.6*  --   --   HGB 10.9* 9.7* 10.0*  9.8* 9.9* 9.5*  HCT 33.8* 28.8* 31.7*  30.8* 30.3* 30.2*  MCV 88.9 87.0 91.4  90.9 88.6 91.8  PLT 160 160 147*  147* 141* 171    Cardiac Enzymes: Recent Labs  Lab 06/22/20 0845  CKTOTAL 209    BNP: Invalid input(s): POCBNP  CBG: Recent Labs  Lab 06/25/20 1552 06/25/20 2025 06/26/20 0413 06/26/20 0825 06/26/20 1111  GLUCAP 97 111* 158* 142* 125*    Microbiology: Results for orders placed or performed during the hospital encounter of 06/22/20  SARS Coronavirus 2 by RT PCR (hospital order, performed in [facility] hospital lab) Nasopharyngeal Nasopharyngeal Swab  Status: None   Collection Time: 06/22/20 11:44 AM   Specimen: Nasopharyngeal Swab  Result Value Ref Range Status   SARS Coronavirus 2 NEGATIVE NEGATIVE Final    Comment: (NOTE) SARS-CoV-2 target nucleic acids are NOT DETECTED.  The SARS-CoV-2 RNA is generally detectable in upper and lower respiratory specimens during the acute phase of infection. The lowest concentration of SARS-CoV-2 viral copies this assay can detect is 250 copies / mL. A negative result does not preclude SARS-CoV-2 infection and should not be used as the sole basis for treatment or other patient management decisions.  A negative result may occur with improper specimen collection / handling, submission of specimen other than nasopharyngeal swab, presence of viral mutation(s) within the areas targeted by this assay, and inadequate number of viral copies (<250 copies / mL). A negative result must be combined with clinical observations, patient history, and epidemiological information.  Fact Sheet for Patients:    StrictlyIdeas.no  Fact Sheet for Healthcare Providers: BankingDealers.co.za  This test is not yet approved or  cleared by the [place] FDA and has been authorized for detection and/or diagnosis of SARS-CoV-2 by FDA under an Emergency Use Authorization (EUA).  This EUA will remain in effect (meaning this test can be used) for the duration of the COVID-19 declaration under Section 564(b)(1) of the Act, 21 U.S.C. section 360bbb-3(b)(1), unless the authorization is terminated or revoked sooner.  Performed at [facility], [address] [place], [place] [address]   Culture, blood (x 2)     Status: None (Preliminary result)   Collection Time: 06/23/20  1:35 AM   Specimen: BLOOD  Result Value Ref Range Status   Specimen Description BLOOD BLOOD RIGHT HAND  Final   Special Requests   Final    BOTTLES DRAWN AEROBIC AND ANAEROBIC Blood Culture adequate volume   Culture   Final    NO GROWTH 3 DAYS Performed at [facility] [address], [place], [place] [address]    Report Status PENDING  Incomplete  Culture, blood (x 2)     Status: None (Preliminary result)   Collection Time: 06/23/20  2:30 AM   Specimen: BLOOD  Result Value Ref Range Status   Specimen Description BLOOD BLOOD RIGHT HAND  Final   Special Requests   Final    BOTTLES DRAWN AEROBIC AND ANAEROBIC Blood Culture adequate volume   Culture   Final    NO GROWTH 3 DAYS Performed at [facility], [address], [place], [place] [address]    Report Status PENDING  Incomplete  C Difficile Quick Screen w PCR reflex     Status: None   Collection Time: 06/24/20  4:25 PM   Specimen: STOOL  Result Value Ref Range Status   C Diff antigen NEGATIVE NEGATIVE Final   C Diff toxin NEGATIVE NEGATIVE Final   C Diff interpretation No C. difficile detected.  Final    Comment: Performed at [facility], [address] [name], [place] [address]   Gastrointestinal Panel by PCR , Stool     Status: None   Collection Time: 06/24/20  4:25 PM   Specimen: Stool  Result Value Ref Range Status   Campylobacter species NOT DETECTED NOT DETECTED Final   Plesimonas shigelloides NOT DETECTED NOT DETECTED Final   Salmonella species NOT DETECTED NOT DETECTED Final   Yersinia enterocolitica NOT DETECTED NOT DETECTED Final   Vibrio species NOT DETECTED NOT DETECTED Final   Vibrio cholerae NOT DETECTED NOT DETECTED  Final   Enteroaggregative E coli (EAEC) NOT DETECTED NOT DETECTED Final   Enteropathogenic E coli (EPEC) NOT DETECTED NOT DETECTED Final   Enterotoxigenic E coli (ETEC) NOT DETECTED NOT DETECTED Final   Shiga like toxin producing E coli (STEC) NOT DETECTED NOT DETECTED Final   Shigella/Enteroinvasive E coli (EIEC) NOT DETECTED NOT DETECTED Final   Cryptosporidium NOT DETECTED NOT DETECTED Final   Cyclospora cayetanensis NOT DETECTED NOT DETECTED Final   Entamoeba histolytica NOT DETECTED NOT DETECTED Final   Giardia lamblia NOT DETECTED NOT DETECTED Final   Adenovirus F40/41 NOT DETECTED NOT DETECTED Final   Astrovirus NOT DETECTED NOT DETECTED Final   Norovirus GI/GII NOT DETECTED NOT DETECTED Final   Rotavirus A NOT DETECTED NOT DETECTED Final   Sapovirus (I, II, IV, and V) NOT DETECTED NOT DETECTED Final    Comment: Performed at [facility], [address] [place], [place] [address]    Coagulation Studies: No results for input(s): LABPROT, INR in the last 72 hours.  Urinalysis: No results for input(s): COLORURINE, LABSPEC, PHURINE, GLUCOSEU, HGBUR, BILIRUBINUR, KETONESUR, PROTEINUR, UROBILINOGEN, NITRITE, LEUKOCYTESUR in the last 72 hours.  Invalid input(s): APPERANCEUR    Imaging: US Venous Img Upper Uni Left (DVT)  Result Date: 06/25/2020 CLINICAL DATA:  Left upper extremity swelling for the past day. Evaluate for DVT. EXAM: LEFT UPPER EXTREMITY VENOUS DOPPLER ULTRASOUND TECHNIQUE: Gray-scale sonography with  graded compression, as well as color Doppler and duplex ultrasound were performed to evaluate the upper extremity deep venous system from the level of the subclavian vein and including the jugular, axillary, basilic, radial, ulnar and upper cephalic vein. Spectral Doppler was utilized to evaluate flow at rest and with distal augmentation maneuvers. COMPARISON:  None. FINDINGS: Contralateral Subclavian Vein: Respiratory phasicity is normal and symmetric with the symptomatic side. No evidence of thrombus. Normal compressibility. Internal Jugular Vein: No evidence of thrombus. Normal compressibility, respiratory phasicity and response to augmentation. Subclavian Vein: No evidence of thrombus. Normal compressibility, respiratory phasicity and response to augmentation. Axillary Vein: No evidence of thrombus. Normal compressibility, respiratory phasicity and response to augmentation. Cephalic Vein: While the proximal and mid aspects of the cephalic vein remains patent (images 12 and 13), there is mixed echogenic occlusive thrombus within distal aspect of the left cephalic vein (images 14, 16 and 17). Basilic Vein: There is short-segment hypoechoic occlusive thrombus involving the proximal aspect of the left basilic vein (images 20 and 25). The filling vein appears patent at its mid (image 21 and distal (image 22) aspects. Brachial Veins: No evidence of thrombus. Normal compressibility, respiratory phasicity and response to augmentation. Radial Veins: No evidence of thrombus. Normal compressibility, respiratory phasicity and response to augmentation. Ulnar Veins: No evidence of thrombus. Normal compressibility, respiratory phasicity and response to augmentation. Venous Reflux:  None visualized. Other Findings: Note is made of two prominent left cervical lymph nodes the largest of which measures approximately 1.2 cm in greatest short axis diameter. IMPRESSION: 1. No evidence of DVT within the left upper extremity. 2.  Examination is positive for occlusive SVT involving the proximal aspect of the left basilic vein and distal aspect of the left cephalic vein. 3. Note is made of two mildly enlarged left cervical lymph nodes, nonspecific though potentially reactive in etiology. Clinical correlation to resolution is advised. Electronically Signed   By: Sandi Mariscal M.D.   On: 06/25/2020 09:13     Medications:   . cefTRIAXone (ROCEPHIN)  IV 2 g (06/25/20 1631)  . lactated ringers 75  mL/hr at 06/26/20 0852  . metronidazole 500 mg (06/26/20 0855)   . aspirin  81 mg Oral QODAY  . buPROPion  300 mg Oral Daily  . heparin injection (subcutaneous)  5,000 Units Subcutaneous Q8H  . insulin aspart  0-15 Units Subcutaneous Q4H  . iron polysaccharides  150 mg Oral QHS  . lidocaine  1 patch Transdermal Q24H  . nystatin  5 mL Oral QID  . simvastatin  40 mg Oral QHS  . vitamin B-12  1,000 mcg Oral QPM   acetaminophen, ALPRAZolam, loperamide, methocarbamol, ondansetron **OR** ondansetron (ZOFRAN) IV, oxyCODONE, triamcinolone cream  Assessment/ Plan:  69 y.o. female with medical problems ofCLL, diabetes, CKD, who was admitted to [facility] on8/22/2021for evaluation of AKI (acute kidney injury) ([place]) [N17.9].  #AKI/chronic kidney disease stage IIIb baseline creatinine 1.6, EGFR 30 #Volume depletion #Generalized weakness Work-up in the emergency room showed increased creatinine of 4.63.  Creatinine now down to 1.28 which is below her prior baseline.  Continue to monitor renal function trend.  Would hold benazapri/hctz upon discharge.  Plain benazepril could be restarted as outpt.  She follows with [facility], Dr. Hollie Salk as outpt, and we recommended she see Dr. Hollie Salk in follow up.  She verbalized understanding.       LOS: 4 [place] Lateef 8/26/202111:48 AM

## 2020-06-28 LAB — CULTURE, BLOOD (ROUTINE X 2)
Culture: NO GROWTH
Culture: NO GROWTH
Special Requests: ADEQUATE
Special Requests: ADEQUATE

## 2020-06-30 ENCOUNTER — Encounter: Payer: Self-pay | Admitting: Family Medicine

## 2020-06-30 ENCOUNTER — Telehealth: Payer: Self-pay | Admitting: Family Medicine

## 2020-06-30 DIAGNOSIS — R197 Diarrhea, unspecified: Secondary | ICD-10-CM

## 2020-06-30 DIAGNOSIS — R110 Nausea: Secondary | ICD-10-CM

## 2020-06-30 MED ORDER — ONDANSETRON HCL 4 MG PO TABS: 4.0000 mg | ORAL_TABLET | Freq: Three times a day (TID) | ORAL | 0 refills | Status: DC | PRN

## 2020-06-30 NOTE — Telephone Encounter (Signed)
Brenda [name] is calling and wanted to see if she could get verbal orders for home physical therapy for 1 week 9 and also she said when she went to see patient her bp was 164/92 with a pulse of 121. Patient mentioned that she had stopped taking blood pressure medicine, please advise. CB is [phone]

## 2020-06-30 NOTE — Telephone Encounter (Signed)
Brenda [name] with [facility] called to answer questions. Patient's last normal stool was before her hospitalization, over a week ago. She had three liquid stools so far today and four yesterday, and they are brown/watery. Patient does not have a fever. Patient tested negative for C. Dif in hospital. Patient also has nausea. Patient and caregiver would like Lomotil and Zofran called to [facility] on [address]. Caregiver also wanted verbal orders for nursing one time a week x 4 weeks. [phone].[place]

## 2020-07-01 ENCOUNTER — Encounter: Payer: Self-pay | Admitting: Family Medicine

## 2020-07-01 MED ORDER — DIPHENOXYLATE-ATROPINE 2.5-0.025 MG PO TABS: 1.0000 | ORAL_TABLET | Freq: Four times a day (QID) | ORAL | 0 refills | Status: DC | PRN

## 2020-07-01 NOTE — Addendum Note (Signed)
Addended by: Wilfred Lacy L on: 07/01/2020 12:18 PM   Modules accepted: Orders

## 2020-07-01 NOTE — Telephone Encounter (Signed)
4tabs. She should maintain her appt with Dr. Loletha Grayer for additional refills

## 2020-07-01 NOTE — Telephone Encounter (Signed)
Patient is requesting a call back. She states that it's urgent and if someone doesn't help her with this diarrhea she may end up back in the hospital. Please give her a call back at [phone].

## 2020-07-01 NOTE — Telephone Encounter (Signed)
Amy with [facility] called with update. Patient states Imodium is not helping. Diarrhea is almost constant. Please call RX for Lomotil asap to same [facility]. Also, Amy still needs verbal order for nursing one time a week x 4 weeks.

## 2020-07-01 NOTE — Telephone Encounter (Signed)
Dr. Loletha Grayer please advise.  Kelly calling for verbal for home PT once a week for 9 weeks. She also said pt bp was 164/92 an pulse 121 at last visit, pt stated she's not taking her BP meds.

## 2020-07-02 ENCOUNTER — Other Ambulatory Visit: Payer: Self-pay

## 2020-07-02 ENCOUNTER — Encounter: Payer: Self-pay | Admitting: Family Medicine

## 2020-07-02 ENCOUNTER — Ambulatory Visit (INDEPENDENT_AMBULATORY_CARE_PROVIDER_SITE_OTHER): Payer: Medicare Other | Admitting: Family Medicine

## 2020-07-02 VITALS — BP 140/80 | HR 88 | Temp 98.7°F | Ht 59.0 in | Wt 186.2 lb

## 2020-07-02 DIAGNOSIS — R197 Diarrhea, unspecified: Secondary | ICD-10-CM | POA: Diagnosis not present

## 2020-07-02 DIAGNOSIS — N179 Acute kidney failure, unspecified: Secondary | ICD-10-CM | POA: Diagnosis not present

## 2020-07-02 DIAGNOSIS — N183 Chronic kidney disease, stage 3 unspecified: Secondary | ICD-10-CM | POA: Diagnosis not present

## 2020-07-02 DIAGNOSIS — I10 Essential (primary) hypertension: Secondary | ICD-10-CM | POA: Diagnosis not present

## 2020-07-02 LAB — BASIC METABOLIC PANEL
BUN: 11 mg/dL (ref 6–23)
CO2: 27 mEq/L (ref 19–32)
Calcium: 8.5 mg/dL (ref 8.4–10.5)
Chloride: 102 mEq/L (ref 96–112)
Creatinine, Ser: 0.99 mg/dL (ref 0.40–1.20)
GFR: 55.21 mL/min — ABNORMAL LOW (ref >60.00)
Glucose, Bld: 123 mg/dL — ABNORMAL HIGH (ref 70–99)
Potassium: 4.4 mEq/L (ref 3.5–5.1)
Sodium: 139 mEq/L (ref 135–145)

## 2020-07-02 LAB — CBC
HCT: 32.8 % — ABNORMAL LOW (ref 36.0–46.0)
Hemoglobin: 10.5 g/dL — ABNORMAL LOW (ref 12.0–15.0)
MCHC: 32.1 g/dL (ref 30.0–36.0)
MCV: 89.9 fl (ref 78.0–100.0)
Platelets: 281 10*3/uL (ref 150.0–400.0)
RBC: 3.64 Mil/uL — ABNORMAL LOW (ref 3.87–5.11)
RDW: 14 % (ref 11.5–15.5)
WBC: 22.8 10*3/uL (ref 4.0–10.5)

## 2020-07-02 MED ORDER — DIPHENOXYLATE-ATROPINE 2.5-0.025 MG PO TABS: 1.0000 | ORAL_TABLET | Freq: Four times a day (QID) | ORAL | 0 refills | Status: DC | PRN

## 2020-07-02 MED ORDER — LISINOPRIL 20 MG PO TABS: 20.0000 mg | ORAL_TABLET | Freq: Every day | ORAL | 3 refills | Status: DC

## 2020-07-02 NOTE — Telephone Encounter (Signed)
[facility] for verbal order for home PT Pt has appt with me today at 11:30am so will address BP and meds at that time

## 2020-07-02 NOTE — Progress Notes (Signed)
Brenda [name] is a 69 y.o. female  Chief Complaint  Patient presents with  . Hospitalization Follow-up    rib fracture due to fall an bp being to low//diarrhea started Saturday can't eat goes right through-complete GI panel and no Cdiff at hospital-    HPI: Brenda [name] is a 69 y.o. female who is seen today for hospital follow-up. Pt was admitted to [facility] from 8/22-8/26/21 after a fall at home and feeling weak and dizzy. Pt would found to have multiple (3) Lt rib fractures, AKI with creatinine of 4.63 (baseline 1.67). There was a concern for acute appendicitis based on CT result but surgery felt findings were not c/w acute appendicitis and no surgery was needed. Pt was given IVF and nephology consulted for AKI, and at d/c creatinine = 1.28. She has f/u scheduled with [facility] Dr. Hollie Salk tomorrow. Pt febrile during hospitalization but UA neg, CXR normal, blood cultures negative, stool for c. Diff and GI pathogen panel negative. She completed 5 days of abx.  She was discharged with home PT but pt does not feel she needs this.  Today pt is tearful, upset, states she's "been through hell" the past 2 weeks. She has been having trouble with diarrhea after anything she eats. She does have an appetite. She has tolerated crackers with peanut butter yesterday. She tried imodium and then yesterday 1 tab of lomotil - diarrhea has slowed significantly. No fever, chills. She is urinating well and states it is light in color.   Past Medical History:  Diagnosis Date  . Anxiety   . Arthritis   . CLL (chronic lymphocytic leukemia) (HCC)    Dx. 2019 Dr. Irene Limbo'  . Depression    Wellbutrin  . Diabetes mellitus    Type II  . Endometrial ca ([place])    1998  . 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 GERD (gastroesophageal reflux disease)   . Heart murmur    patient states "cardiologist has not heard heart murmur for past several years"  . Heel  spur   . History of hiatal hernia    small  . 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 Hyperkalemia   . Hyperlipidemia   . Hypertension   . Left leg swelling    "swelling in left leg from knee down when sitting for prolonged periods or being on feet for a long period of time"  . PONV (postoperative nausea and vomiting)    after hysterectomy  . Renal insufficiency    Stage 3 b   Dr. Particia Nearing first visit 11-2019    Past Surgical History:  Procedure Laterality Date  . ABDOMINAL HYSTERECTOMY  1998  . COLONOSCOPY    . EYE SURGERY Bilateral    cataracts  . EYE SURGERY Left    retina tear  . FEMUR IM NAIL  10/25/2012   Procedure: INTRAMEDULLARY (IM) NAIL FEMORAL;  Surgeon: Mauri Pole, MD;  Location: [facility];  Service: Orthopedics;  Laterality: Left;  . HIP SURGERY     Fracture L IM nail and 3 rods  . left knee arthroscopy  12/2010  . LYMPH NODE BIOPSY     axillary left 12-2018  . REFRACTIVE SURGERY    . skin cancer removed right and and lower forearm     squamoun in situ  . TOTAL KNEE ARTHROPLASTY  12/20/2011   Procedure: TOTAL KNEE ARTHROPLASTY;  Surgeon: Gearlean Alf, MD;  Location: [facility];  Service: Orthopedics;  Laterality: Left;  Failed attempt at spinal   . TOTAL SHOULDER ARTHROPLASTY Right 08/19/2016   Procedure: RIGHT TOTAL SHOULDER ARTHROPLASTY;  Surgeon: Justice Britain, MD;  Location: [facility];  Service: Orthopedics;  Laterality: Right;  . TOTAL SHOULDER ARTHROPLASTY Left 01/10/2020   Procedure: TOTAL SHOULDER ARTHROPLASTY;  Surgeon: Justice Britain, MD;  Location: [facility];  Service: Orthopedics;  Laterality: Left;  15mn    Social History   Socioeconomic History  . Marital status: Single    Spouse name: Not on file  . Number of children: Not on file  . Years of education: Not on file  . Highest education level: Not on file  Occupational History  . Occupation: RTherapist, [employer] [employer] Tobacco Use  . Smoking status: Never Smoker  . Smokeless tobacco: Never Used  Vaping Use  . Vaping Use: Never used  Substance and Sexual Activity  . Alcohol use: No  . Drug use: No  . Sexual activity: Not Currently  Other Topics Concern  . Not on file  Social History Narrative   [facility] [employer]            Social Determinants of Health   Financial Resource Strain:   . Difficulty of Paying Living Expenses: Not on file  Food Insecurity:   . Worried About RCharity fundraiserin the Last Year: Not on file  . Ran Out of Food in the Last Year: Not on file  Transportation Needs:   . Lack of Transportation (Medical): Not on file  . Lack of Transportation (Non-Medical): Not on file  Physical Activity:   . Days of Exercise per Week: Not on file  . Minutes of Exercise per Session: Not on file  Stress:   . Feeling of Stress : Not on file  Social Connections:   . Frequency of Communication with Friends and Family: Not on file  . Frequency of Social Gatherings with Friends and Family: Not on file  . Attends Religious Services: Not on file  . Active Member of Clubs or Organizations: Not on file  . Attends CArchivistMeetings: Not on file  . Marital Status: Not on file  Intimate Partner Violence:   . Fear of Current or Ex-Partner: Not on file  . Emotionally Abused: Not on file  . Physically Abused: Not on file  . Sexually Abused: Not on file    Family History  Problem Relation Age of Onset  . Cancer Mother        breast cancer  . Cancer Father        plasma sarcoma  . Breast cancer Neg Hx      Immunization History  Administered Date(s) Administered  . Fluad Quad(high Dose 65+) 06/27/2019  . Influenza,inj,quad, With Preservative 07/28/2017  . Influenza-Unspecified 08/01/2014, 07/31/2016  . PFIZER SARS-COV-2 Vaccination 12/31/2019, 01/21/2020  . Pneumococcal Conjugate-13 10/01/2014  . Pneumococcal Polysaccharide-23 11/03/2012,  12/19/2017  . Td 11/12/2009  . Tdap 05/15/2020  . Zoster 12/14/2010  . Zoster Recombinat (Shingrix) 05/15/2020    Outpatient Encounter Medications as of 07/02/2020  Medication Sig  . acetaminophen (TYLENOL) 500 MG tablet Take 500-1,000 mg by mouth every 6 (six) hours as needed for mild pain, moderate pain or fever.   . ALPRAZolam (XANAX) 1 MG tablet Take 0.5-1 tablets (0.5-1 mg  total) by mouth 2 (two) times daily as needed. for anxiety  . aspirin 81 MG tablet Take 81 mg by mouth every other day.   . Blood Glucose Monitoring Suppl (ONETOUCH VERIO) w/Device KIT Use to check blood sugar 3 times a day  . buPROPion (WELLBUTRIN XL) 300 MG 24 hr tablet Take 1 tablet (300 mg total) by mouth daily.  . Cholecalciferol (VITAMIN D3) 2000 units TABS Take 2,000 mcg by mouth daily.  . Cyanocobalamin (B-12) 1000 MCG SUBL Place 1,000 mcg under the tongue every evening.   . diphenoxylate-atropine (LOMOTIL) 2.5-0.025 MG tablet Take 1 tablet by mouth 4 (four) times daily as needed for diarrhea or loose stools.  . doxazosin (CARDURA) 2 MG tablet TAKE 1 TABLET(2 MG) BY MOUTH DAILY (Patient taking differently: Take 2 mg by mouth daily. )  . esomeprazole (NEXIUM) 20 MG capsule Take 20 mg by mouth daily as needed (acid reflux symptoms).   . ezetimibe (ZETIA) 10 MG tablet Take 1 tablet (10 mg total) by mouth daily. (Patient taking differently: Take 100 mg by mouth every evening. )  . gabapentin (NEURONTIN) 300 MG capsule Take 1 capsule (300 mg total) by mouth at bedtime.  Marland Kitchen glipiZIDE (GLUCOTROL XL) 2.5 MG 24 hr tablet Take 2.5 mg by mouth 2 (two) times daily with a meal.  . glucose blood (ONETOUCH VERIO) test strip Use to check blood sugar 3 times a day.  . insulin glargine (LANTUS SOLOSTAR) 100 UNIT/ML Solostar Pen Inject 22-24 Units into the skin at bedtime. (Patient taking differently: Inject 24 Units into the skin at bedtime. )  . Insulin Pen Needle 32G X 4 MM MISC Use 1x a day  . iron polysaccharides (NIFEREX) 150  MG capsule Take 150 mg by mouth at bedtime.   Elmore Guise Devices (LANCING DEVICE) MISC Use as advised - Freestyle  . metoprolol tartrate (LOPRESSOR) 25 MG tablet TAKE 1 TABLET BY MOUTH ONCE DAILY (Patient taking differently: Take 25 mg by mouth daily. )  . NONFORMULARY OR COMPOUNDED ITEM Apply 1 application topically 2 (two) times daily as needed (sun damage on face). FLUOROURACIL 5% + CALCIPOTRIENE 0.005%  . ondansetron (ZOFRAN) 4 MG tablet Take 1 tablet (4 mg total) by mouth every 8 (eight) hours as needed for nausea or vomiting.  . simvastatin (ZOCOR) 40 MG tablet Take 1 tablet (40 mg total) by mouth at bedtime.  . triamcinolone cream (KENALOG) 0.1 % Apply 1 application topically 2 (two) times daily as needed (skin rash.).   . [DISCONTINUED] diphenoxylate-atropine (LOMOTIL) 2.5-0.025 MG tablet Take 1 tablet by mouth 4 (four) times daily as needed for diarrhea or loose stools.  Marland Kitchen lisinopril (ZESTRIL) 20 MG tablet Take 1 tablet (20 mg total) by mouth daily.  . [DISCONTINUED] methocarbamol (ROBAXIN) 500 MG tablet Take 1 tablet (500 mg total) by mouth every 8 (eight) hours as needed for muscle spasms. (Patient not taking: Reported on 07/02/2020)  . [DISCONTINUED] oxyCODONE (OXY IR/ROXICODONE) 5 MG immediate release tablet Take 1 tablet (5 mg total) by mouth every 4 (four) hours as needed for moderate pain or severe pain. (Patient not taking: Reported on 07/02/2020)   No facility-administered encounter medications on file as of 07/02/2020.     ROS: Pertinent positives and negatives noted in HPI. Remainder of ROS non-contributory    Allergies  Allergen Reactions  . Amlodipine     Swelling    . Nsaids     Due to kidney function    BP 140/80   Pulse 88  Temp 98.7 F (37.1 C) (Temporal)   Ht 4' 11" (1.499 m)   Wt 186 lb 3.2 oz (84.5 kg)   SpO2 97%   BMI 37.61 kg/m   Physical Exam   A/P:  1. Diarrhea, unspecified type - improved in past 18hrs or so - cont with fluids and small amount  of bland foods - CBC - Basic metabolic panel Refill: - diphenoxylate-atropine (LOMOTIL) 2.5-0.025 MG tablet; Take 1 tablet by mouth 4 (four) times daily as needed for diarrhea or loose stools.  Dispense: 20 tablet; Refill: 0  2. Stage 3 chronic kidney disease, unspecified whether stage 3a or 3b CKD 3. AKI (acute kidney injury) ([place]) - back to baseline at d/c and has nephro appt tomorrow - Basic metabolic panel  4. Essential hypertension - controlled, at goal - lisinopril/hctz d/c'd Rx: - lisinopril (ZESTRIL) 20 MG tablet; Take 1 tablet (20 mg total) by mouth daily.  Dispense: 90 tablet; Refill: 3

## 2020-07-02 NOTE — Telephone Encounter (Signed)
Brenda [name] was given the OK for home PT.

## 2020-07-03 ENCOUNTER — Telehealth: Payer: Self-pay | Admitting: Family Medicine

## 2020-07-03 DIAGNOSIS — I129 Hypertensive chronic kidney disease with stage 1 through stage 4 chronic kidney disease, or unspecified chronic kidney disease: Secondary | ICD-10-CM | POA: Diagnosis not present

## 2020-07-03 DIAGNOSIS — N1832 Chronic kidney disease, stage 3b: Secondary | ICD-10-CM | POA: Diagnosis not present

## 2020-07-03 DIAGNOSIS — R197 Diarrhea, unspecified: Secondary | ICD-10-CM

## 2020-07-03 DIAGNOSIS — D72829 Elevated white blood cell count, unspecified: Secondary | ICD-10-CM

## 2020-07-03 DIAGNOSIS — E1122 Type 2 diabetes mellitus with diabetic chronic kidney disease: Secondary | ICD-10-CM | POA: Diagnosis not present

## 2020-07-03 DIAGNOSIS — C9110 Chronic lymphocytic leukemia of B-cell type not having achieved remission: Secondary | ICD-10-CM | POA: Diagnosis not present

## 2020-07-03 NOTE — Telephone Encounter (Signed)
Called and spoke with pt regarding lab results from tests done yesterday as part of hospital f/u appt Pt states she feels "so much better today". Denies diarrhea, abd pain, fever, chills. Appetite is good and she is tolerating food. She has not taken any more imodium or lomotil.  She is on her way to nephrology appt. Renal function has improved significantly. Electrolytes normal. CBC shows significantly elevated WBC = 22.8. 1 week prior WBC = 13.1 and during hospitalization highest WBC = 15.4.  Pt has normal UA, CXR, and negative blood cultures. C diff and GI pathogen panel also negative. Diarrhea has started 1 week prior to admission. She was treated with rocephin and flagyl x 5 days because of fever but no source of infection was identified. Pt has remained afebrile since 06/24/20. Pt does have a h/o CLL and follows with Dr. Irene Limbo. Unclear as to the etiology of this elevated WBC as pt feels better than she has in weeks, no fever, no diarrhea or abd pain. Will plan to repeat CBC w/ diff early next week and repeat c diff as well since pt was on abx during hospitalization. Will also reach out to oncology Dr. Irene Limbo to see if he thinks this could be related to pts CLL.  Pt is scheduled for lab appt 9/7 at 10:40am.  Component     Latest Ref Rng & Units 06/24/2020 06/26/2020 07/02/2020            Sodium     135 - 145 mEq/L 137 140 139  Potassium     3.5 - 5.1 mEq/L 4.1 4.6 4.4  Chloride     96 - 112 mEq/L 106 107 102  CO2     19 - 32 mEq/L 21 (L) 20 (L) 27  Glucose     70 - 99 mg/dL 97 156 (H) 123 (H)  BUN     6 - 23 mg/dL 43 (H) 22 11  Creatinine     0.40 - 1.20 mg/dL 2.12 (H) 1.28 (H) 0.99  Calcium     8.4 - 10.5 mg/dL 7.9 (L) 8.1 (L) 8.5  GFR     >60.00 mL/min   55.21 (L)    Component     Latest Ref Rng & Units 05/12/2020 06/22/2020 06/22/2020 06/23/2020          8:45 AM 11:30 PM  9:19 AM  WBC     4.0 - 10.5 K/uL 8.9 15.4 (H) 13.7 (H) 15.4 (H)  RBC     3.87 - 5.11 Mil/uL 4.19 3.80 (L) 3.31  (L) 3.39 (L)  Hemoglobin     12.0 - 15.0 g/dL 11.9 (L) 10.9 (L) 9.7 (L) 9.8 (L)  HCT     36 - 46 % 37.1 33.8 (L) 28.8 (L) 30.8 (L)  MCV     78.0 - 100.0 fl 88.5 88.9 87.0 90.9  MCH     26.0 - 34.0 pg 28.4 28.7 29.3 28.9  MCHC     30.0 - 36.0 g/dL 32.1 32.2 33.7 31.8  RDW     11.5 - 15.5 % 12.3 13.2 13.2 13.4  Platelets     150 - 400 K/uL 132 (L) 160 160 147 (L)  nRBC     0.0 - 0.2 % 0.0 0.0 0.0 0.0  Neutrophils     % 36     NEUT#     1.7 - 7.7 K/uL 3.2     Lymphocytes     % 52  Lymphocyte #     0.7 - 4.0 K/uL 4.6 (H)     Monocytes Relative     % 8     Monocyte #     0 - 1 K/uL 0.7     Eosinophil     % 2     Eosinophils Absolute     0 - 0 K/uL 0.2     Basophil     % 1     Basophils Absolute     0 - 0 K/uL 0.1     Immature Granulocytes     % 1     Abs Immature Granulocytes     0.00 - 0.07 K/uL 0.04     RBC Morphology           WBC Morphology           Smear Review            Component     Latest Ref Rng & Units 06/23/2020 06/24/2020 06/26/2020         9:19 AM    WBC     4.0 - 10.5 K/uL 15.4 (H) 10.8 (H) 13.1 (H)  RBC     3.87 - 5.11 Mil/uL 3.47 (L) 3.42 (L) 3.29 (L)  Hemoglobin     12.0 - 15.0 g/dL 10.0 (L) 9.9 (L) 9.5 (L)  HCT     36 - 46 % 31.7 (L) 30.3 (L) 30.2 (L)  MCV     78.0 - 100.0 fl 91.4 88.6 91.8  MCH     26.0 - 34.0 pg 28.8 28.9 28.9  MCHC     30.0 - 36.0 g/dL 31.5 32.7 31.5  RDW     11.5 - 15.5 % 13.9 13.6 13.2  Platelets     150 - 400 K/uL 147 (L) 141 (L) 171  nRBC     0.0 - 0.2 % 0.0 0.0 0.0  Neutrophils     % 68    NEUT#     1.7 - 7.7 K/uL 10.6 (H)    Lymphocytes     % 25    Lymphocyte #     0.7 - 4.0 K/uL 3.8    Monocytes Relative     % 6    Monocyte #     0 - 1 K/uL 0.9    Eosinophil     % 0    Eosinophils Absolute     0 - 0 K/uL 0.0    Basophil     % 0    Basophils Absolute     0 - 0 K/uL 0.1    Immature Granulocytes     % 1    Abs Immature Granulocytes     0.00 - 0.07 K/uL 0.08 (H)    RBC Morphology           WBC Morphology          Smear Review           Component     Latest Ref Rng & Units 07/02/2020          WBC     4.0 - 10.5 K/uL 22.8 Repeated and verified X2. (HH)  RBC     3.87 - 5.11 Mil/uL 3.64 (L)  Hemoglobin     12.0 - 15.0 g/dL 10.5 (L)  HCT     36 - 46 % 32.8 (L)  MCV     78.0 - 100.0 fl 89.9  MCH  26.0 - 34.0 pg   MCHC     30.0 - 36.0 g/dL 32.1  RDW     11.5 - 15.5 % 14.0  Platelets     150 - 400 K/uL 281.0  nRBC     0.0 - 0.2 %   Neutrophils     %   NEUT#     1.7 - 7.7 K/uL   Lymphocytes     %   Lymphocyte #     0.7 - 4.0 K/uL   Monocytes Relative     %   Monocyte #     0 - 1 K/uL   Eosinophil     %   Eosinophils Absolute     0 - 0 K/uL   Basophil     %   Basophils Absolute     0 - 0 K/uL   Immature Granulocytes     %   Abs Immature Granulocytes     0.00 - 0.07 K/uL   RBC Morphology        WBC Morphology        Smear Review

## 2020-07-05 ENCOUNTER — Other Ambulatory Visit: Payer: Self-pay | Admitting: Cardiovascular Disease

## 2020-07-08 ENCOUNTER — Other Ambulatory Visit: Payer: Self-pay

## 2020-07-08 ENCOUNTER — Other Ambulatory Visit (INDEPENDENT_AMBULATORY_CARE_PROVIDER_SITE_OTHER): Payer: Medicare Other

## 2020-07-08 ENCOUNTER — Encounter: Payer: Self-pay | Admitting: Internal Medicine

## 2020-07-08 ENCOUNTER — Ambulatory Visit (INDEPENDENT_AMBULATORY_CARE_PROVIDER_SITE_OTHER): Payer: Medicare Other | Admitting: Internal Medicine

## 2020-07-08 VITALS — BP 120/90 | HR 81 | Ht 59.0 in | Wt 184.0 lb

## 2020-07-08 DIAGNOSIS — Z6835 Body mass index (BMI) 35.0-35.9, adult: Secondary | ICD-10-CM

## 2020-07-08 DIAGNOSIS — E1165 Type 2 diabetes mellitus with hyperglycemia: Secondary | ICD-10-CM | POA: Diagnosis not present

## 2020-07-08 DIAGNOSIS — E782 Mixed hyperlipidemia: Secondary | ICD-10-CM

## 2020-07-08 DIAGNOSIS — D72829 Elevated white blood cell count, unspecified: Secondary | ICD-10-CM

## 2020-07-08 LAB — CBC WITH DIFFERENTIAL/PLATELET
Basophils Absolute: 0.1 10*3/uL (ref 0.0–0.1)
Basophils Relative: 0.6 % (ref 0.0–3.0)
Eosinophils Absolute: 0.2 10*3/uL (ref 0.0–0.7)
Eosinophils Relative: 1.4 % (ref 0.0–5.0)
HCT: 34 % — ABNORMAL LOW (ref 36.0–46.0)
Hemoglobin: 10.8 g/dL — ABNORMAL LOW (ref 12.0–15.0)
Lymphocytes Relative: 56 % — ABNORMAL HIGH (ref 12.0–46.0)
Lymphs Abs: 7.5 10*3/uL — ABNORMAL HIGH (ref 0.7–4.0)
MCHC: 31.8 g/dL (ref 30.0–36.0)
MCV: 90 fl (ref 78.0–100.0)
Monocytes Absolute: 0.6 10*3/uL (ref 0.1–1.0)
Monocytes Relative: 4.7 % (ref 3.0–12.0)
Neutro Abs: 5 10*3/uL (ref 1.4–7.7)
Neutrophils Relative %: 37.3 % — ABNORMAL LOW (ref 43.0–77.0)
Platelets: 272 10*3/uL (ref 150.0–400.0)
RBC: 3.78 Mil/uL — ABNORMAL LOW (ref 3.87–5.11)
RDW: 15.3 % (ref 11.5–15.5)
WBC: 13.4 10*3/uL — ABNORMAL HIGH (ref 4.0–10.5)

## 2020-07-08 LAB — POCT GLYCOSYLATED HEMOGLOBIN (HGB A1C): Hemoglobin A1C: 6.4 % — AB (ref 4.0–5.6)

## 2020-07-08 NOTE — Patient Instructions (Signed)
Please continue: - Lantus 10-24 units after dinner - Glipizide XR 2.5 mg before b'fast and 2.5(-5) mg before dinner  Try to walk for at least 30 min a day.  Please return in 3-4 months with your sugar log.

## 2020-07-08 NOTE — Progress Notes (Signed)
Patient ID: SALLE BRANDLE, female   DOB: 1951/03/08, 69 y.o.   MRN: [id number]  This visit occurred during the SARS-CoV-2 public health emergency.  Safety protocols were in place, including screening questions prior to the visit, additional usage of staff PPE, and extensive cleaning of exam room while observing appropriate contact time as indicated for disinfecting solutions.   HPI: Brenda [name] is a 69 y.o.-year-old female, presenting for follow-up for DM2, dx in 2007, insulin-dependent, uncontrolled, with complications (CKD). Last visit 4 months ago.   Last month she fell and broke multiple ribs (due to dehydration).  She was admitted and found to be in acute renal failure.  She had shoulder surgery in 12/2019.  After being cleared by Ortho, [facility]  Reviewed HbA1c levels: Lab Results  Component Value Date   HGBA1C 7.7 (H) 05/15/2020   HGBA1C 6.9 (H) 01/10/2020   HGBA1C 6.9 (A) 11/06/2019   Patient is on: - Lantus 12 units in a.m.- started 05/2019 by PCP >> 20 units, moved to evening >> 22-24 )+(actually 10-20) units at night - Glipizide 2.5 >> 5 mg >> 2.5-5 mg >> glipizide XR 2.5 before a large dinner She was on Glipizide in 11/2015 after a steroid inj. We stopped Victoza due to nausea. Metformin ER was stopped by PCP due to CKD. Glipizide ER was stopped 06/2019 due to low blood sugars.   Pt checks her sugars twice a day: - am:  52, 66-134, 154, 178, 183 >> 119, 128-198 >> 127-173, 180 >> 99-151, 166  - 2h after b'fast: 156 >> n/c >> 148 >> n/c - before lunch: n/c >> 75-115 >> 80-169, 197 >> 85-147 >> 70-132, 155 - 2h after lunch: 118, 123 >> 92, 99 >> n/c - before dinner: 60-114 >> 74-149, 151 >> 67, 81-158, 162 >> 80-143, 151 - 2h after dinner:n/c >> 138, 148 >> n/c - bedtime: 74, 80-170 >> 91-143 >> n/c >> 107-128 - nighttime: n/c Lowest sugar was 37 x1 >> ...  52 >> 74 >> 67 (skipped lunch) >> 70; she has hypoglycemia awareness  in the 70s. Highest sugar was 190 >> 278 (steroids) >> 184 >> 198 >> 166 (200s before changing Glipizide IR >> ER)  Glucometer: Freestyle Lite  Pt's meals are: - Breakfast: English muffin + preserve or cereal or yoghurt - snack: fruit or fruit + yoghurt - Lunch: 1/2 sandwich + chips + salsa + fruit/sugar free >> protein bar - Dinner: soup or chicken/steak + veggies, occasional starch or Lean cuisine or light salad No soft drinks.  -+ CKD, last BUN/creatinine:  Lab Results  Component Value Date   BUN 11 07/02/2020   CREATININE 0.99 07/02/2020  On benazepril.  Sees nephrology. She had a high potassium (5.9) >> used Kayexalate x3 >> normalized.  She is on a low potassium diet. -+ HL; last set of lipids: Lab Results  Component Value Date   CHOL 116 05/15/2020   HDL 21.90 (L) 05/15/2020   LDLCALC 50 05/30/2019   LDLDIRECT 55.0 05/15/2020   TRIG 207.0 (H) 05/15/2020   CHOLHDL 5 05/15/2020  On Zocor and Zetia. - last eye exam 03/2020: No DR; she had cataract surgery in 2016.  She has a history of retinal tear. -No numbness and tingling in her feet.  She takes Neurontin for leg cramps at night.  She also has CLL, HTN, GERD, depression.  She was admitted 11/2017 for dehydration, anemia, and acute renal failure >>  diagnosed with CLL.  ROS: Constitutional: no weight gain/+ weight loss, no fatigue, no subjective hyperthermia, no subjective hypothermia Eyes: no blurry vision, no xerophthalmia ENT: no sore throat, no nodules palpated in neck, no dysphagia, no odynophagia, no hoarseness Cardiovascular: no CP/+ SOB/no palpitations/no leg swelling Respiratory: no cough/+ SOB/no wheezing Gastrointestinal: no N/no V/no D/no C/no acid reflux Musculoskeletal: no muscle aches/no joint aches Skin: no rashes, no hair loss Neurological: no tremors/no numbness/no tingling/no dizziness  I reviewed pt's medications, allergies, PMH, social hx, family hx, and changes were documented in the history  of present illness. Otherwise, unchanged from my initial visit note.  Past Medical History:  Diagnosis Date  . Anxiety   . Arthritis   . CLL (chronic lymphocytic leukemia) (HCC)    Dx. 2019 Dr. Irene Limbo'  . Depression    Wellbutrin  . Diabetes mellitus    Type II  . Endometrial ca ([place])    1998  . 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 GERD (gastroesophageal reflux disease)   . Heart murmur    patient states "cardiologist has not heard heart murmur for past several years"  . Heel spur   . History of hiatal hernia    small  . 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 Hyperkalemia   . Hyperlipidemia   . Hypertension   . Left leg swelling    "swelling in left leg from knee down when sitting for prolonged periods or being on feet for a long period of time"  . PONV (postoperative nausea and vomiting)    after hysterectomy  . Renal insufficiency    Stage 3 b   Dr. Particia Nearing first visit 11-2019   Past Surgical History:  Procedure Laterality Date  . ABDOMINAL HYSTERECTOMY  1998  . COLONOSCOPY    . EYE SURGERY Bilateral    cataracts  . EYE SURGERY Left    retina tear  . FEMUR IM NAIL  10/25/2012   Procedure: INTRAMEDULLARY (IM) NAIL FEMORAL;  Surgeon: Mauri Pole, MD;  Location: [facility];  Service: Orthopedics;  Laterality: Left;  . HIP SURGERY     Fracture L IM nail and 3 rods  . left knee arthroscopy  12/2010  . LYMPH NODE BIOPSY     axillary left 12-2018  . REFRACTIVE SURGERY    . skin cancer removed right and and lower forearm     squamoun in situ  . TOTAL KNEE ARTHROPLASTY  12/20/2011   Procedure: TOTAL KNEE ARTHROPLASTY;  Surgeon: Gearlean Alf, MD;  Location: [facility];  Service: Orthopedics;  Laterality: Left;  Failed attempt at spinal   . TOTAL SHOULDER ARTHROPLASTY Right 08/19/2016   Procedure: RIGHT TOTAL SHOULDER ARTHROPLASTY;  Surgeon: Justice Britain, MD;  Location: [place];  Service: Orthopedics;  Laterality: Right;  . TOTAL SHOULDER ARTHROPLASTY Left 01/10/2020   Procedure: TOTAL SHOULDER ARTHROPLASTY;  Surgeon: Justice Britain, MD;  Location: [facility];  Service: Orthopedics;  Laterality: Left;  132mn   Social History   Social History  . Marital Status: Single    Spouse Name: N/A  . Number of Children: 0   Occupational History  . [place] [employer]  . Smoking status: Never Smoker   . Smokeless tobacco: Never Used  . Alcohol Use: No  . 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 acetaminophen (TYLENOL) 500 MG tablet Take 500-1,000 mg by mouth every 6 (six) hours as needed for mild pain, moderate pain or fever.     . ALPRAZolam (XANAX) 1 MG tablet Take 0.5-1 tablets (0.5-1 mg total) by mouth 2 (two) times daily as needed. for anxiety 60 tablet 2  . aspirin 81 MG tablet Take 81 mg by mouth every other day.     . Blood Glucose Monitoring Suppl (ONETOUCH VERIO) w/Device KIT Use to check blood sugar 3 times a day 1 kit 0  . buPROPion (WELLBUTRIN XL) 300 MG 24 hr tablet Take 1 tablet (300 mg total) by mouth daily. 90 tablet 2  . Cholecalciferol (VITAMIN D3) 2000 units TABS Take 2,000 mcg by mouth daily.    . Cyanocobalamin (B-12) 1000 MCG SUBL Place 1,000 mcg under the tongue every evening.     Marland Kitchen doxazosin (CARDURA) 2 MG tablet TAKE 1 TABLET(2 MG) BY MOUTH DAILY (Patient taking differently: Take 2 mg by mouth daily. ) 30 tablet 4  . esomeprazole (NEXIUM) 20 MG capsule Take 20 mg by mouth daily as needed (acid reflux symptoms).     . ezetimibe (ZETIA) 10 MG tablet Take 1 tablet (10 mg total) by mouth daily. (Patient taking differently: Take 100 mg by mouth every evening. ) 90 tablet 3  . gabapentin (NEURONTIN) 300 MG capsule Take 1 capsule (300 mg total) by mouth at bedtime. 90 capsule 3  .  glipiZIDE (GLUCOTROL XL) 2.5 MG 24 hr tablet Take 2.5 mg by mouth 2 (two) times daily with a meal.    . glucose blood (ONETOUCH VERIO) test strip Use to check blood sugar 3 times a day. 300 each 12  . insulin glargine (LANTUS SOLOSTAR) 100 UNIT/ML Solostar Pen Inject 22-24 Units into the skin at bedtime. (Patient taking differently: Inject 24 Units into the skin at bedtime. ) 18 mL 3  . Insulin Pen Needle 32G X 4 MM MISC Use 1x a day 100 each 3  . iron polysaccharides (NIFEREX) 150 MG capsule Take 150 mg by mouth at bedtime.     Elmore Guise Devices (LANCING DEVICE) MISC Use as advised - Freestyle 1 each 0  . lisinopril (ZESTRIL) 20 MG tablet Take 1 tablet (20 mg total) by mouth daily. 90 tablet 3  . metoprolol tartrate (LOPRESSOR) 25 MG tablet TAKE 1 TABLET BY MOUTH ONCE DAILY (Patient taking differently: Take 25 mg by mouth daily. ) 90 tablet 0  . NONFORMULARY OR COMPOUNDED ITEM Apply 1 application topically 2 (two) times daily as needed (sun damage on face). FLUOROURACIL 5% + CALCIPOTRIENE 0.005%    . ondansetron (ZOFRAN) 4 MG tablet Take 1 tablet (4 mg total) by mouth every 8 (eight) hours as needed for nausea or vomiting. 20 tablet 0  . simvastatin (ZOCOR) 40 MG tablet Take 1 tablet (40 mg total) by mouth at bedtime. 90 tablet 0  . triamcinolone cream (KENALOG) 0.1 % Apply 1 application topically 2 (two) times daily as needed (skin rash.).      No current facility-administered medications on file prior to visit.   Allergies  Allergen Reactions  . Amlodipine     Swelling    . Nsaids     Due to kidney function   Family History  Problem Relation Age of Onset  . Cancer Mother  breast cancer  . Cancer Father        plasma sarcoma  . Breast cancer Neg Hx    PE: BP 120/90   Pulse 81   Ht 4' 11"  (1.499 m)   Wt 184 lb (83.5 kg)   SpO2 92%   BMI 37.16 kg/m  Body mass index is 37.16 kg/m. Wt Readings from Last 3 Encounters:  07/08/20 184 lb (83.5 kg)  07/02/20 186 lb 3.2 oz  (84.5 kg)  06/26/20 190 lb (86.2 kg)   Constitutional: overweight, in NAD Eyes: PERRLA, EOMI, no exophthalmos ENT: moist mucous membranes, no thyromegaly, no cervical lymphadenopathy Cardiovascular: RRR, No MRG, + LLE edema -chronic Respiratory: CTA B Gastrointestinal: abdomen soft, NT, ND, BS+ Musculoskeletal: no deformities, strength intact in all 4 Skin: moist, warm, no rashes Neurological: no tremor with outstretched hands, DTR normal in all 4  ASSESSMENT: 1. DM2, now insulin-dependent, uncontrolled, with complications  - mild CKD  2. Obesity class 2  3. HL  PLAN:  1. Patient with longstanding, uncontrolled, diabetes, with much improved control since summer 2020 after addition of insulin.  At that time, Metformin was stopped due to worsening kidney function.  HbA1c initially decreased down to 6.3% but then started to increase again.  She was on Victoza but this was stopped due to nausea.  We discussed at last visit about retrying a GLP-1 receptor agonist but did not do so yet as she was reticent to try another GLP-1 receptor agonist.  At last visit, sugars were better in the morning but still many of them above target despite increasing the Lantus dose and moving it at bedtime.  We discussed about ways to improve her sugars in the morning including walking for 30 minutes in the evening or afternoon but also increasing Lantus. -At this visit, sugars have improved especially after switching from glipizide IR to XR after she contacted me with high blood sugars.  She is taking a lower dose of Lantus now since she is still not eating well after her recent hospitalization.  Her sugars have improved at all times of the day, including in the morning.  She occasionally has a high blood sugar at 150-160 but the majority of her sugars are at goal.  Later in the day, she rarely has an occasional blood sugar higher than goal.  Her sugars at bedtime are excellent.  For now, I advised her to continue the  current regimen.  She is taking a lower dose of Lantus and she is aware that she needs to increase it as she starts eating better. -We obtained a new HbA1c level today at her request: 6.4% (much better) - I suggested to:  Patient Instructions  Please continue: - Lantus 10-24 units after dinner - Glipizide XR 2.5 mg before b'fast and 2.5(-5) mg before dinner  Try to walk for at least 30 min a day.  Please return in 3-4 months with your sugar log.  - advised to check sugars at different times of the day - 1x a day, rotating check times - advised for yearly eye exams >> she is UTD - return to clinic in 3-4 months     2. Obesity class 2 -Unfortunately, she continues on insulin and glipizide, which are both conducive to weight gain -She is having problems using the right foods to eat since she also has to limit potassium - 2 lbs lost since last OV  3.  Hyperlipidemia - Reviewed latest lipid panel from 05/2020: LDL excellent,  at goal, check sides high, HDL low: Lab Results  Component Value Date   CHOL 116 05/15/2020   HDL 21.90 (L) 05/15/2020   LDLCALC 50 05/30/2019   LDLDIRECT 55.0 05/15/2020   TRIG 207.0 (H) 05/15/2020   CHOLHDL 5 05/15/2020  -Continues simvastatin and Zetia without side effects  Office Visit on 07/08/2020  Component Date Value Ref Range Status  . Hemoglobin A1C 07/08/2020 6.4* 4.0 - 5.6 % Final   Philemon Kingdom, MD PhD [facility] Endocrinology

## 2020-07-09 ENCOUNTER — Ambulatory Visit: Payer: Medicare Other | Admitting: Family

## 2020-07-09 ENCOUNTER — Telehealth: Payer: Self-pay

## 2020-07-09 ENCOUNTER — Other Ambulatory Visit (INDEPENDENT_AMBULATORY_CARE_PROVIDER_SITE_OTHER): Payer: Medicare Other

## 2020-07-09 DIAGNOSIS — R197 Diarrhea, unspecified: Secondary | ICD-10-CM | POA: Diagnosis not present

## 2020-07-09 DIAGNOSIS — D72829 Elevated white blood cell count, unspecified: Secondary | ICD-10-CM | POA: Diagnosis not present

## 2020-07-09 NOTE — Telephone Encounter (Signed)
-----   Message from Ronnald Nian, DO sent at 07/09/2020  7:42 AM EDT ----- Regarding: RE: C Diff test Yes I'd like her to complete the c diff test.  Marita [place], please call pt to make her aware. ----- Message ----- From: Lynnea Ferrier, RT Sent: 07/08/2020   9:27 AM EDT To: Ronnald Nian, DO Subject: C Diff test                                    Pt came in for CBC draw. I had everything together for her to take home to provide a stool sample for the C diff test you ordered. She stated she hasn't had diarrhea in 2 days. She took the supplies for the test home and said she would wait to here from you whether she still needs to do this or not. Please advise.  Thanks,  Levada Dy

## 2020-07-09 NOTE — Telephone Encounter (Signed)
Pt was notified and verbally understood to complete the C diff test.

## 2020-07-11 LAB — CLOSTRIDIUM DIFFICILE BY PCR: Toxigenic C. Difficile by PCR: NEGATIVE

## 2020-07-15 ENCOUNTER — Other Ambulatory Visit: Payer: Self-pay

## 2020-07-16 ENCOUNTER — Ambulatory Visit (INDEPENDENT_AMBULATORY_CARE_PROVIDER_SITE_OTHER): Payer: Medicare Other

## 2020-07-16 DIAGNOSIS — Z23 Encounter for immunization: Secondary | ICD-10-CM

## 2020-07-16 NOTE — Progress Notes (Signed)
Pt came in for her 2nd shingrix shot, pt got injections in her left deltoid, pt tolerated the injection well. While here she asked if she could get her high dose flu shot, I verbally got the ok from Dr. Loletha Grayer and pt received her flu shot in her right deltoid, pt tolerated injection well and was instructed to wait 15-20 minutes before she left as well as information sheets for shingles and flu shots.

## 2020-07-29 DIAGNOSIS — Z23 Encounter for immunization: Secondary | ICD-10-CM | POA: Diagnosis not present

## 2020-08-04 ENCOUNTER — Telehealth: Payer: Self-pay | Admitting: Family Medicine

## 2020-08-04 NOTE — Telephone Encounter (Signed)
Left message for patient to schedule Annual Wellness Visit.  Please schedule with Nurse Health Advisor Caroleen Hamman, RN at [facility]

## 2020-08-05 ENCOUNTER — Other Ambulatory Visit: Payer: Self-pay | Admitting: Internal Medicine

## 2020-08-16 ENCOUNTER — Other Ambulatory Visit: Payer: Self-pay | Admitting: Internal Medicine

## 2020-08-18 ENCOUNTER — Other Ambulatory Visit: Payer: Self-pay | Admitting: Internal Medicine

## 2020-08-18 ENCOUNTER — Encounter: Payer: Self-pay | Admitting: Internal Medicine

## 2020-08-18 DIAGNOSIS — Z96612 Presence of left artificial shoulder joint: Secondary | ICD-10-CM | POA: Diagnosis not present

## 2020-08-18 DIAGNOSIS — Z471 Aftercare following joint replacement surgery: Secondary | ICD-10-CM | POA: Diagnosis not present

## 2020-08-18 MED ORDER — ONETOUCH VERIO W/DEVICE KIT: PACK | 0 refills | Status: DC

## 2020-08-25 DIAGNOSIS — D225 Melanocytic nevi of trunk: Secondary | ICD-10-CM | POA: Diagnosis not present

## 2020-08-25 DIAGNOSIS — D692 Other nonthrombocytopenic purpura: Secondary | ICD-10-CM | POA: Diagnosis not present

## 2020-08-25 DIAGNOSIS — L570 Actinic keratosis: Secondary | ICD-10-CM | POA: Diagnosis not present

## 2020-08-25 DIAGNOSIS — D485 Neoplasm of uncertain behavior of skin: Secondary | ICD-10-CM | POA: Diagnosis not present

## 2020-08-25 DIAGNOSIS — L905 Scar conditions and fibrosis of skin: Secondary | ICD-10-CM | POA: Diagnosis not present

## 2020-08-25 DIAGNOSIS — D1801 Hemangioma of skin and subcutaneous tissue: Secondary | ICD-10-CM | POA: Diagnosis not present

## 2020-08-25 DIAGNOSIS — L821 Other seborrheic keratosis: Secondary | ICD-10-CM | POA: Diagnosis not present

## 2020-08-25 DIAGNOSIS — Z85828 Personal history of other malignant neoplasm of skin: Secondary | ICD-10-CM | POA: Diagnosis not present

## 2020-09-03 ENCOUNTER — Other Ambulatory Visit: Payer: Self-pay | Admitting: Cardiovascular Disease

## 2020-09-11 ENCOUNTER — Other Ambulatory Visit: Payer: Self-pay | Admitting: Family Medicine

## 2020-09-11 NOTE — Telephone Encounter (Signed)
Received a refill request for:  Alprazolam 1 mg LR 06/11/20, # 60, 2 rf's LOV  07/12/20  FOV  None scheduled.     Please review and advise.  Thanks.   Dm/cma

## 2020-09-22 ENCOUNTER — Other Ambulatory Visit: Payer: Self-pay

## 2020-09-22 ENCOUNTER — Ambulatory Visit
Admission: RE | Admit: 2020-09-22 | Discharge: 2020-09-22 | Disposition: A | Discharge status: Home / Self Care | Payer: Medicare Other | Source: Ambulatory Visit | Attending: Family Medicine | Admitting: Family Medicine

## 2020-09-22 DIAGNOSIS — Z1231 Encounter for screening mammogram for malignant neoplasm of breast: Secondary | ICD-10-CM | POA: Diagnosis not present

## 2020-10-03 ENCOUNTER — Other Ambulatory Visit: Payer: Self-pay | Admitting: Cardiovascular Disease

## 2020-10-08 ENCOUNTER — Telehealth: Payer: Self-pay

## 2020-10-08 NOTE — Telephone Encounter (Signed)
Received a refill request for: Bupropion XL 300 mg  LR 12/10/19, #90, 2 rfs ([place]) LOV 07/02/20 FOV  None scheduled.  Please advise. Thanks.  Dm/cma

## 2020-10-09 MED ORDER — BUPROPION HCL ER (XL) 300 MG PO TB24
300.0000 mg | ORAL_TABLET | Freq: Every day | ORAL | 3 refills | Status: DC
Start: 2020-10-09 — End: 2021-09-28

## 2020-10-09 NOTE — Addendum Note (Signed)
Addended by: Ronnald Nian on: 10/09/2020 08:19 AM   Modules accepted: Orders

## 2020-10-10 DIAGNOSIS — E1122 Type 2 diabetes mellitus with diabetic chronic kidney disease: Secondary | ICD-10-CM | POA: Diagnosis not present

## 2020-10-10 DIAGNOSIS — N1832 Chronic kidney disease, stage 3b: Secondary | ICD-10-CM | POA: Diagnosis not present

## 2020-10-10 DIAGNOSIS — C9110 Chronic lymphocytic leukemia of B-cell type not having achieved remission: Secondary | ICD-10-CM | POA: Diagnosis not present

## 2020-10-10 DIAGNOSIS — I129 Hypertensive chronic kidney disease with stage 1 through stage 4 chronic kidney disease, or unspecified chronic kidney disease: Secondary | ICD-10-CM | POA: Diagnosis not present

## 2020-10-13 NOTE — Progress Notes (Signed)
Subjective:   Brenda [name] is a 69 y.o. female who presents for Medicare Annual (Subsequent) preventive examination.  I connected with [place] today by telephone and verified that I am speaking with the correct person using two identifiers. Location patient: home Location provider: work Persons participating in the virtual visit: patient, Marine scientist.    I discussed the limitations, risks, security and privacy concerns of performing an evaluation and management service by telephone and the availability of in person appointments. I also discussed with the patient that there may be a patient responsible charge related to this service. The patient expressed understanding and verbally consented to this telephonic visit.    Interactive audio and video telecommunications were attempted between this provider and patient, however failed, due to patient having technical difficulties OR patient did not have access to video capability.  We continued and completed visit with audio only.  Some vital signs may be absent or patient reported.   Time Spent with patient on telephone encounter: 20 minutes   Review of Systems     Cardiac Risk Factors include: advanced age (>35mn, >>42women);diabetes mellitus;dyslipidemia;hypertension;obesity (BMI >30kg/m2);sedentary lifestyle     Objective:    Today's Vitals   10/14/20 1501  Weight: 179 lb 9.6 oz (81.5 kg)  Height: 4' 11"  (1.499 m)   Body mass index is 36.27 kg/m.  Advanced Directives 10/14/2020 06/23/2020 06/22/2020 05/12/2020 01/10/2020 01/03/2020 05/10/2018  Does Patient Have a Medical Advance Directive? Yes Yes Yes No Yes Yes No  Type of AParamedicof ALymanLiving will - - - HHowardLiving will HSt. ClairLiving will -  Does patient want to make changes to medical advance directive? - No - Patient declined - - No - Patient declined No - Patient declined -  Copy of [place]anc[place]te Estatesin Chart? No - copy requested - - - No - copy requested No - copy requested -  Would patient like information on creating a medical advance directive? - - - No - Patient declined - - No - Patient declined  Pre-existing out of facility DNR order (yellow form or pink MOST form) - - - - - - -    Current Medications (verified) Outpatient Encounter Medications as of 10/14/2020  Medication Sig   acetaminophen (TYLENOL) 500 MG tablet Take 500-1,000 mg by mouth every 6 (six) hours as needed for mild pain, moderate pain or fever.    ALPRAZolam (XANAX) 1 MG tablet TAKE 1/2 TO 1 TABLET(0.5 TO 1 MG) BY MOUTH TWICE DAILY AS NEEDED FOR ANXIETY   aspirin 81 MG tablet Take 81 mg by mouth every other day.    BD PEN NEEDLE NANO 2ND GEN 32G X 4 MM MISC USE DAILY AS DIRECTED   Blood Glucose Monitoring Suppl (ONETOUCH VERIO) w/Device KIT Use as advised   buPROPion (WELLBUTRIN XL) 300 MG 24 hr tablet Take 1 tablet (300 mg total) by mouth daily.   calcium-vitamin D (OSCAL WITH D) 500-200 MG-UNIT tablet Take 1 tablet by mouth. With vtamin E   Cholecalciferol (VITAMIN D3) 2000 units TABS Take 2,000 mcg by mouth daily.   Cyanocobalamin (B-12) 1000 MCG SUBL Place 1,000 mcg under the tongue every evening.    esomeprazole (NEXIUM) 20 MG capsule Take 20 mg by mouth daily as needed (acid reflux symptoms).    ezetimibe (ZETIA) 10 MG tablet Take 1 tablet (10 mg total) by mouth daily. (Patient taking differently: Take 100 mg by mouth every evening.)  gabapentin (NEURONTIN) 300 MG capsule Take 1 capsule (300 mg total) by mouth at bedtime.   glipiZIDE (GLUCOTROL XL) 2.5 MG 24 hr tablet Take 2.5 mg by mouth 2 (two) times daily with a meal.   glucose blood (ONETOUCH VERIO) test strip USE TO CHECK BLOOD SUGAR THREE TIMES DAILY   insulin glargine (LANTUS SOLOSTAR) 100 UNIT/ML Solostar Pen Inject 22-24 Units into the skin at bedtime. (Patient taking differently: Inject 24 Units into the skin at bedtime.)    iron polysaccharides (NIFEREX) 150 MG capsule Take 150 mg by mouth at bedtime.    Lancet Devices (LANCING DEVICE) MISC Use as advised - Freestyle   lisinopril (ZESTRIL) 20 MG tablet Take 1 tablet (20 mg total) by mouth daily.   metoprolol tartrate (LOPRESSOR) 25 MG tablet TAKE 1 TABLET BY MOUTH EVERY DAY   NONFORMULARY OR COMPOUNDED ITEM Apply 1 application topically 2 (two) times daily as needed (sun damage on face). FLUOROURACIL 5% + CALCIPOTRIENE 0.005%   simvastatin (ZOCOR) 40 MG tablet TAKE 1 TABLET(40 MG) BY MOUTH AT BEDTIME   triamcinolone cream (KENALOG) 0.1 % Apply 1 application topically 2 (two) times daily as needed (skin rash.).    ondansetron (ZOFRAN) 4 MG tablet Take 1 tablet (4 mg total) by mouth every 8 (eight) hours as needed for nausea or vomiting. (Patient not taking: Reported on 10/14/2020)   [DISCONTINUED] doxazosin (CARDURA) 2 MG tablet TAKE 1 TABLET(2 MG) BY MOUTH DAILY (Patient taking differently: Take 2 mg by mouth daily. )   No facility-administered encounter medications on file as of 10/14/2020.    Allergies (verified) Amlodipine and Nsaids   History: Past Medical History:  Diagnosis Date   Anxiety    Arthritis    CLL (chronic lymphocytic leukemia) ([place])    Dx. 2019 Dr. 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 Particia Nearing first visit 11-2019   Past Surgical History:  Procedure Laterality Date   ABDOMINAL HYSTERECTOMY  1998   COLONOSCOPY     EYE SURGERY Bilateral    cataracts   EYE SURGERY Left    retina tear   FEMUR IM NAIL  10/25/2012   Procedure: INTRAMEDULLARY (IM) NAIL FEMORAL;  Surgeon: Mauri Pole, MD;  Location: [facility];  Service: Orthopedics;  Laterality: Left;   HIP SURGERY     Fracture L IM nail and 3 rods   left knee arthroscopy  12/2010   LYMPH NODE BIOPSY     axillary left 12-2018   REFRACTIVE SURGERY     skin cancer removed right  and and lower forearm     squamoun in situ   TOTAL KNEE ARTHROPLASTY  12/20/2011   Procedure: TOTAL KNEE ARTHROPLASTY;  Surgeon: Gearlean Alf, MD;  Location: [facility];  Service: Orthopedics;  Laterality: Left;  Failed attempt at spinal    TOTAL SHOULDER ARTHROPLASTY Right 08/19/2016   Procedure: RIGHT TOTAL SHOULDER ARTHROPLASTY;  Surgeon: Justice Britain, MD;  Location: [facility];  Service: Orthopedics;  Laterality: Right;   TOTAL SHOULDER ARTHROPLASTY Left 01/10/2020   Procedure: TOTAL SHOULDER ARTHROPLASTY;  Surgeon: Justice Britain, MD;  Location: [facility];  Service: Orthopedics;  Laterality: Left;  179mn   Family History  Problem Relation Age of Onset   Cancer Mother        breast cancer   Cancer Father        plasma sarcoma   Breast cancer Neg Hx    Social History   Socioeconomic History   Marital status: Single    Spouse name: Not on file   Number of children: Not on file   Years of education: Not on file   Highest education level: Not on file  Occupational History   Occupation: RTherapist, 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    Employer: [employer]  Tobacco Use   Smoking status: Never Smoker   Smokeless tobacco: Never Used  Vaping Use   Vaping Use: Never used  Substance and Sexual Activity   Alcohol use: No    Drug use: No   Sexual activity: Not Currently  Other Topics Concern   Not on file  Social History Narrative   RN at [employer]            Social Determinants of Health   Financial Resource Strain: Low Risk    Difficulty of Paying Living Expenses: Not hard at all  Food Insecurity: No Food Insecurity   Worried About RCharity fundraiserin the Last Year: Never true   RFarrellin the Last Year: Never true  Transportation Needs: No Transportation Needs   Lack of Transportation (Medical): No   Lack of Transportation (Non-Medical): No  Physical Activity: Inactive   Days of Exercise per Week: 0 days   Minutes of Exercise per Session: 0 min  Stress: No Stress Concern Present   Feeling of Stress : Not at all  Social Connections: Moderately Isolated   Frequency of Communication with Friends and Family: More than three times a week   Frequency of Social Gatherings with Friends and Family: More than three times a week   Attends Religious Services: More than 4 times per year   Active Member of [employer] Organizations: No   Attends CMusic therapist Never   Marital Status: Never married    Tobacco Counseling Counseling given: Not Answered   Clinical Intake:  Pre-visit preparation completed: Yes  Pain : No/denies pain     Nutritional Status: BMI > 30  Obese Nutritional Risks: None Diabetes: Yes CBG done?: No Did pt. 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  Yes  If diabetic, was a CBG obtained today?  No  Did the patient bring in their glucometer from home?  No virtual visit How often do  you monitor your CBG's? 3 times per day.   Financial Strains and Diabetes  Management:  Are you having any financial strains with the device, your supplies or your medication? No .  Does the patient want to be seen by Chronic Care Management for management of their diabetes?  No  Would the patient like to be referred to a Nutritionist or for Diabetic Management?  No   Diabetic Exams:  Diabetic Eye Exam: Completed 01/17/2020.  Diabetic Foot Exam: Pt has been advised about the importance in completing this exam. To be completed by PCP    Interpreter Needed?: No  Information entered by :: Caroleen Hamman LPN   Activities of Daily Living In your present state of health, do you have any difficulty performing the following activities: 10/14/2020 06/23/2020  Hearing? N N  Vision? N N  Difficulty concentrating or making decisions? N N  Walking or climbing stairs? N N  Dressing or bathing? N Y  Doing errands, shopping? N N  Preparing Food and eating ? N -  Using the Toilet? N -  In the past six months, have you accidently leaked urine? N -  Do you have problems with loss of bowel control? N -  Managing your Medications? N -  Managing your Finances? N -  Housekeeping or managing your Housekeeping? N -  Some recent data might be hidden    Patient Care Team: Ronnald Nian, DO as PCP - General (Family Medicine) Rockey Situ, Kathlene November, MD as PCP - Cardiology (Cardiology) Minna Merritts, MD as Consulting Physician (Cardiology)  Indicate any recent Medical Services you may have received from other than [facility] providers in the past year (date may be approximate).     Assessment:   This is a routine wellness examination for Ashonte.  Hearing/Vision screen  Hearing Screening   125Hz  250Hz  500Hz  1000Hz  2000Hz  3000Hz  4000Hz  6000Hz  8000Hz   Right ear:           Left ear:           Comments: No issues  Vision Screening Comments: Reading glasses sees Dr. Brayton El   Dietary issues and exercise activities discussed: Current Exercise Habits: The patient does not  participate in regular exercise at present, Exercise limited by: None identified  Goals     Patient Stated     Drink more water      Depression Screen PHQ 2/9 Scores 10/14/2020 05/15/2020 05/02/2019 03/13/2018 12/19/2017 03/07/2017  PHQ - 2 Score 1 3 1  0 4 0  PHQ- 9 Score - 10 4 - 10 -    Fall Risk Fall Risk  10/14/2020 05/15/2020 05/02/2019 03/13/2018 03/07/2017  Falls in the past year? 1 0 0 No Yes  Number falls in past yr: 0 - - - 1  Injury with Fall? 1 - - - No  Risk Factor Category  - - - - -  Risk for fall due to : History of fall(s) - - - -  Follow up Falls prevention discussed - Falls evaluation completed - -    FALL RISK PREVENTION PERTAINING TO THE HOME:  Any stairs in or around the home? No  Home free of loose throw rugs in walkways, pet beds, electrical cords, etc? Yes  Adequate lighting in your home to reduce risk of falls? Yes   ASSISTIVE DEVICES UTILIZED TO PREVENT FALLS:  Life alert? No  Use of a cane, walker or w/c? No  Grab bars in the bathroom? Yes  Shower chair or bench  in shower? No  Elevated toilet seat or a handicapped toilet? No   TIMED UP AND GO:  Was the test performed? No . Phone visit   Cognitive Function:Normal cognitive status assessed by this Nurse Health Advisor. No abnormalities found.          Immunizations Immunization History  Administered Date(s) Administered   Fluad Quad(high Dose 65+) 06/27/2019, 07/16/2020   Influenza,inj,quad, With Preservative 07/28/2017   Influenza-Unspecified 08/01/2014, 07/31/2016   PFIZER SARS-COV-2 Vaccination 12/31/2019, 01/21/2020, 07/31/2020   Pneumococcal Conjugate-13 10/01/2014   Pneumococcal Polysaccharide-23 11/03/2012, 12/19/2017   Td 11/12/2009   Tdap 05/15/2020   Zoster 12/14/2010   Zoster Recombinat (Shingrix) 05/15/2020, 07/16/2020    TDAP status: Up to date  Flu Vaccine status: Up to date  Pneumococcal vaccine status: Up to date  Covid-19 vaccine status: Completed  vaccines  Qualifies for Shingles Vaccine? No   Zostavax completed Yes   Shingrix Completed?: Yes  Screening Tests Health Maintenance  Topic Date Due   FOOT EXAM  05/01/2020   HEMOGLOBIN A1C  01/05/2021   OPHTHALMOLOGY EXAM  01/16/2021   COVID-19 Vaccine (4 - Booster for Pfizer series) 01/28/2021   MAMMOGRAM  09/22/2021   COLONOSCOPY  08/31/2028   TETANUS/TDAP  05/15/2030   INFLUENZA VACCINE  Completed   DEXA SCAN  Completed   Hepatitis C Screening  Completed   PNA vac Low Risk Adult  Completed    Health Maintenance  Health Maintenance Due  Topic Date Due   FOOT EXAM  05/01/2020    Colorectal cancer screening: Type of screening: Colonoscopy. Completed 08/31/2018. Repeat every 10 years  Mammogram status: Completed Bilateral 09/22/2020. Repeat every year  Bone Density status: Completed 05/21/2020. Results reflect: Bone density results: OSTEOPENIA. Repeat every 2 years.  Lung Cancer Screening: (Low Dose CT Chest recommended if Age 72-80 years, 30 pack-year currently smoking OR have quit w/in 15years.) does not qualify.    Additional Screening:  Hepatitis C Screening:Completed 07/16/2015  Vision Screening: Recommended annual ophthalmology exams for early detection of glaucoma and other disorders of the eye. Is the patient up to date with their annual eye exam?  Yes  Who is the provider or what is the name of the office in which the patient attends annual eye exams? Dr. Brayton El   Dental Screening: Recommended annual dental exams for proper oral hygiene  Community Resource Referral / Chronic Care Management: CRR required this visit?  No   CCM required this visit?  No      Plan:     I have personally reviewed and noted the following in the patients chart:    Medical and social history  Use of alcohol, tobacco or illicit drugs   Current medications and supplements  Functional ability and status  Nutritional status  Physical  activity  Advanced directives  List of other physicians  Hospitalizations, surgeries, and ER visits in previous 12 months  Vitals  Screenings to include cognitive, depression, and falls  Referrals and appointments  In addition, I have reviewed and discussed with patient certain preventive protocols, quality metrics, and best practice recommendations. A written personalized care plan for preventive services as well as general preventive health recommendations were provided to patient.   Due to this being a telephone visit, the after visit summary with patients personalized plan was offered to patient via mail or my-chart. Patient would like to access on my-chart.   Marta Antu, LPN   [id number]  Nurse Health Advisor  Nurse Notes: None

## 2020-10-14 ENCOUNTER — Ambulatory Visit (INDEPENDENT_AMBULATORY_CARE_PROVIDER_SITE_OTHER): Payer: Medicare Other

## 2020-10-14 VITALS — Ht 59.0 in | Wt 179.6 lb

## 2020-10-14 DIAGNOSIS — Z0000 Encounter for general adult medical examination without abnormal findings: Secondary | ICD-10-CM | POA: Diagnosis not present

## 2020-10-14 NOTE — Patient Instructions (Signed)
Brenda [name] , Thank you for taking time to complete your Medicare Wellness Visit. I appreciate your ongoing commitment to your health goals. Please review the following plan we discussed and let me know if I can assist you in the future.   Screening recommendations/referrals: Colonoscopy: Completed 08/31/2018-Not required after age 71 Mammogram: Completed 09/22/2020- Due-09/22/2021 Bone Density: Completed 05/21/2020-Due-05/21/2022 Recommended yearly ophthalmology/optometry visit for glaucoma screening and checkup Recommended yearly dental visit for hygiene and checkup  Vaccinations: Influenza vaccine: Up to date Pneumococcal vaccine: Completed vaccines Tdap vaccine: Up to date-Due-05/15/2030 Shingles vaccine: Completed vaccines   Covid-19:Completed vaccines  Advanced directives: Please bring a copy for your chart  Conditions/risks identified: See problem list  Next appointment: Follow up in one year for your annual wellness visit    Preventive Care 65 Years and Older, Female Preventive care refers to lifestyle choices and visits with your health care provider that can promote health and wellness. What does preventive care include?  A yearly physical exam. This is also called an annual well check.  Dental exams once or twice a year.  Routine eye exams. Ask your health care provider how often you should have your eyes checked.  Personal lifestyle choices, including:  Daily care of your teeth and gums.  Regular physical activity.  Eating a healthy diet.  Avoiding tobacco and drug use.  Limiting alcohol use.  Practicing safe sex.  Taking low-dose aspirin every day.  Taking vitamin and mineral supplements as recommended by your health care provider. What happens during an annual well check? The services and screenings done by your health care provider during your annual well check will depend on your age, overall health, lifestyle risk factors, and family history of  disease. Counseling  Your health care provider may ask you questions about your:  Alcohol use.  Tobacco use.  Drug use.  Emotional well-being.  Home and relationship well-being.  Sexual activity.  Eating habits.  History of falls.  Memory and ability to understand (cognition).  Work and work Statistician.  Reproductive health. Screening  You may have the following tests or measurements:  Height, weight, and BMI.  Blood pressure.  Lipid and cholesterol levels. These may be checked every 5 years, or more frequently if you are over 43 years old.  Skin check.  Lung cancer screening. You may have this screening every year starting at age 84 if you have a 30-pack-year history of smoking and currently smoke or have quit within the past 15 years.  Fecal occult blood test (FOBT) of the stool. You may have this test every year starting at age 42.  Flexible sigmoidoscopy or colonoscopy. You may have a sigmoidoscopy every 5 years or a colonoscopy every 10 years starting at age 39.  Hepatitis C blood test.  Hepatitis B blood test.  Sexually transmitted disease (STD) testing.  Diabetes screening. This is done by checking your blood sugar (glucose) after you have not eaten for a while (fasting). You may have this done every 1-3 years.  Bone density scan. This is done to screen for osteoporosis. You may have this done starting at age 95.  Mammogram. This may be done every 1-2 years. Talk to your health care provider about how often you should have regular mammograms. Talk with your health care provider about your test results, treatment options, and if necessary, the need for more tests. Vaccines  Your health care provider may recommend certain vaccines, such as:  Influenza vaccine. This is recommended every year.  Tetanus,  diphtheria, and acellular pertussis (Tdap, Td) vaccine. You may need a Td booster every 10 years.  Zoster vaccine. You may need this after age  79.  Pneumococcal 13-valent conjugate (PCV13) vaccine. One dose is recommended after age 73.  Pneumococcal polysaccharide (PPSV23) vaccine. One dose is recommended after age 10. Talk to your health care provider about which screenings and vaccines you need and how often you need them. This information is not intended to replace advice given to you by your health care provider. Make sure you discuss any questions you have with your health care provider. Document Released: 11/14/2015 Document Revised: 07/07/2016 Document Reviewed: 08/19/2015 Elsevier Interactive Patient Education  2017 [place] Prevention in the Home Falls can cause injuries. They can happen to people of all ages. There are many things you can do to make your home safe and to help prevent falls. What can I do on the outside of my home?  Regularly fix the edges of walkways and driveways and fix any cracks.  Remove anything that might make you trip as you walk through a door, such as a raised step or threshold.  Trim any bushes or trees on the path to your home.  Use bright outdoor lighting.  Clear any walking paths of anything that might make someone trip, such as rocks or tools.  Regularly check to see if handrails are loose or broken. Make sure that both sides of any steps have handrails.  Any raised decks and porches should have guardrails on the edges.  Have any leaves, snow, or ice cleared regularly.  Use sand or salt on walking paths during winter.  Clean up any spills in your garage right away. This includes oil or grease spills. What can I do in the bathroom?  Use night lights.  Install grab bars by the toilet and in the tub and shower. Do not use towel bars as grab bars.  Use non-skid mats or decals in the tub or shower.  If you need to sit down in the shower, use a plastic, non-slip stool.  Keep the floor dry. Clean up any water that spills on the floor as soon as it happens.  Remove  soap buildup in the tub or shower regularly.  Attach bath mats securely with double-sided non-slip rug tape.  Do not have throw rugs and other things on the floor that can make you trip. What can I do in the bedroom?  Use night lights.  Make sure that you have a light by your bed that is easy to reach.  Do not use any sheets or blankets that are too big for your bed. They should not hang down onto the floor.  Have a firm chair that has side arms. You can use this for support while you get dressed.  Do not have throw rugs and other things on the floor that can make you trip. What can I do in the kitchen?  Clean up any spills right away.  Avoid walking on wet floors.  Keep items that you use a lot in easy-to-reach places.  If you need to reach something above you, use a strong step stool that has a grab bar.  Keep electrical cords out of the way.  Do not use floor polish or wax that makes floors slippery. If you must use wax, use non-skid floor wax.  Do not have throw rugs and other things on the floor that can make you trip. What can I do with  my stairs?  Do not leave any items on the stairs.  Make sure that there are handrails on both sides of the stairs and use them. Fix handrails that are broken or loose. Make sure that handrails are as long as the stairways.  Check any carpeting to make sure that it is firmly attached to the stairs. Fix any carpet that is loose or worn.  Avoid having throw rugs at the top or bottom of the stairs. If you do have throw rugs, attach them to the floor with carpet tape.  Make sure that you have a light switch at the top of the stairs and the bottom of the stairs. If you do not have them, ask someone to add them for you. What else can I do to help prevent falls?  Wear shoes that:  Do not have high heels.  Have rubber bottoms.  Are comfortable and fit you well.  Are closed at the toe. Do not wear sandals.  If you use a  stepladder:  Make sure that it is fully opened. Do not climb a closed stepladder.  Make sure that both sides of the stepladder are locked into place.  Ask someone to hold it for you, if possible.  Clearly mark and make sure that you can see:  Any grab bars or handrails.  First and last steps.  Where the edge of each step is.  Use tools that help you move around (mobility aids) if they are needed. These include:  Canes.  Walkers.  Scooters.  Crutches.  Turn on the lights when you go into a dark area. Replace any light bulbs as soon as they burn out.  Set up your furniture so you have a clear path. Avoid moving your furniture around.  If any of your floors are uneven, fix them.  If there are any pets around you, be aware of where they are.  Review your medicines with your doctor. Some medicines can make you feel dizzy. This can increase your chance of falling. Ask your doctor what other things that you can do to help prevent falls. This information is not intended to replace advice given to you by your health care provider. Make sure you discuss any questions you have with your health care provider. Document Released: 08/14/2009 Document Revised: 03/25/2016 Document Reviewed: 11/22/2014 Elsevier Interactive Patient Education  2017 [employer].

## 2020-10-15 ENCOUNTER — Ambulatory Visit: Payer: Medicare Other | Admitting: Cardiovascular Disease

## 2020-10-15 NOTE — Progress Notes (Signed)
Patient ID: Brenda [name], female   DOB: Jul 25, 1951, 68 y.o.   MRN: [id number] Cardiology Office Note  Date:  10/17/2020   ID:  Brenda, [name] Mar 08, 1951, MRN [id number]  PCP:  Brenda Nian, DO   Chief Complaint  Patient presents with   Other    Office visit-12 month F/U. No new cardiac concerns.    HPI:  [name] is a  25 -year-old woman with history of coronary artery disease, CT coronary calcium score 1031 obesity, diabetes, poorly controlled, hemoglobin A1c previously of 12  Now 6 hyperlipidemia,  with previous stress test and echocardiogram for abnormal EKG,  CLL s/p shoulder replacement surgery who presents for routine followup of her coronary artery disease and hyperlipidemia.  Past 6 months discussed with her Reports over the summer had a difficult time, "hot outside, not drinking" In hospital 06/2020, hospital records reviewed in detail  fall, fractured ribs, dehydration, acute renal failure  weakness and dizziness  acute kidney injury and has a serum creatinine of 4.63 compared to baseline of 1.67. Fever of 101. So far cultures are negative. Stool for C. difficile negative. Patient on Rocephin and Flagyl Treated with midodrine  follow-up with Dr. Hollie Salk from [facility]  PT recommended home with home services 24-hour supervision Was very SOB for weeks after she got home  Does not check pressure at home At recent office visit, nephrology, systolic 979  Lost 6 pounds  Left >right mild leg swelling  EKG personally reviewed by myself on todays visit Shows nsr with rate 62 bpm, no significant ST or T wave changes  Other past medical history reviewed In the hospital 11/2017 Hospital records reviewed with the patient in detail Dehydrated Lung nodules, enlarged lymph nodes Anemic  HBG 6.6 after hydration, iron def, stool negative BP was low. 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  PSH:    Past Surgical History:  Procedure Laterality Date   ABDOMINAL HYSTERECTOMY  1998   COLONOSCOPY     EYE SURGERY Bilateral    cataracts   EYE SURGERY Left    retina tear   FEMUR IM NAIL  10/25/2012   Procedure: INTRAMEDULLARY (IM) NAIL FEMORAL;  Surgeon: Mauri Pole, MD;  Location: [facility];  Service: Orthopedics;  Laterality: Left;   HIP SURGERY     Fracture L IM nail and 3 rods   left knee arthroscopy  12/2010   LYMPH NODE BIOPSY     axillary left 12-2018   REFRACTIVE SURGERY     skin cancer removed right and and lower forearm     squamoun in situ   TOTAL KNEE ARTHROPLASTY  12/20/2011   Procedure: TOTAL KNEE ARTHROPLASTY;  Surgeon: Gearlean Alf, MD;  Location: [facility];  Service: Orthopedics;  Laterality: Left;  Failed attempt at spinal    TOTAL SHOULDER ARTHROPLASTY Right 08/19/2016   Procedure: RIGHT TOTAL SHOULDER ARTHROPLASTY;  Surgeon: Justice Britain, MD;  Location: [facility];  Service: Orthopedics;  Laterality: Right;   TOTAL SHOULDER ARTHROPLASTY Left 01/10/2020   Procedure: TOTAL SHOULDER ARTHROPLASTY;  Surgeon: Justice Britain, MD;  Location: [facility];  Service: Orthopedics;  Laterality: Left;  17mn    Current Outpatient Medications  Medication Sig Dispense Refill   acetaminophen (TYLENOL) 500 MG tablet Take 500-1,000 mg by mouth every 6 (six) hours as needed for mild pain, moderate pain or fever.      ALPRAZolam (XANAX) 1 MG tablet TAKE 1/2 TO 1 TABLET(0.5 TO 1  MG) BY MOUTH TWICE DAILY AS NEEDED FOR ANXIETY 60 tablet 2   aspirin 81 MG tablet Take 81 mg by mouth every other day.      BD PEN NEEDLE NANO 2ND GEN 32G X 4 MM MISC USE DAILY AS DIRECTED 100 each 3   Blood Glucose Monitoring Suppl (ONETOUCH VERIO) w/Device KIT Use as advised 1 kit 0   buPROPion (WELLBUTRIN XL) 300 MG 24 hr tablet Take 1 tablet (300 mg total) by mouth daily. 90 tablet 3   calcium-vitamin D (OSCAL WITH D) 500-200 MG-UNIT tablet Take 1 tablet by mouth. With vtamin E     Cholecalciferol (VITAMIN D3) 2000 units TABS Take 2,000 mcg by mouth daily.     Cyanocobalamin (B-12) 1000 MCG SUBL Place 1,000 mcg under the tongue every evening.      esomeprazole (NEXIUM) 20 MG capsule Take 20 mg by mouth daily as needed (acid reflux symptoms).      ezetimibe (ZETIA) 10 MG tablet Take 1 tablet (10 mg total) by mouth daily. 90 tablet 3   glipiZIDE (GLUCOTROL XL) 2.5 MG 24 hr tablet Take 1 tablet in the AM and 2 tablets in the evening     glucose blood (ONETOUCH VERIO) test strip USE TO CHECK BLOOD SUGAR THREE TIMES DAILY 300 strip 1   insulin glargine (LANTUS SOLOSTAR) 100 UNIT/ML Solostar Pen Inject 22-24 Units into the skin at bedtime. 18 mL 3   iron polysaccharides (NIFEREX) 150 MG capsule Take 150 mg by mouth at bedtime.      Lancet Devices (LANCING DEVICE) MISC Use as advised - Freestyle 1 each 0   lisinopril (ZESTRIL) 20 MG tablet Take 1 tablet (20 mg total) by mouth daily. 90 tablet 3   NONFORMULARY OR COMPOUNDED ITEM Apply 1 application topically 2 (two) times daily as needed (sun damage on face). FLUOROURACIL 5% + CALCIPOTRIENE 0.005%     ondansetron (ZOFRAN) 4 MG tablet Take 1 tablet (4 mg total) by mouth every 8 (eight) hours as needed for nausea or vomiting. 20 tablet 0   simvastatin (ZOCOR) 40 MG tablet TAKE 1 TABLET(40 MG) BY MOUTH AT BEDTIME 90 tablet 0   triamcinolone cream (KENALOG) 0.1 % Apply 1 application topically 2 (two) times daily as needed (skin rash.).       metoprolol succinate (TOPROL-XL) 50 MG 24 hr tablet Take 0.5 tablets (25 mg total) by mouth daily. Take with or immediately following a meal. 45 tablet 3   No current facility-administered medications for this visit.    Allergies:   Amlodipine and Nsaids   Social History:  The patient  reports that she has never smoked. She has never used smokeless tobacco. She reports that she does not drink alcohol and does not use drugs.   Family History:   family history includes Cancer in her father and mother.    Review of Systems: Review of Systems  Constitutional: Negative.   HENT: Negative.   Respiratory: Negative.   Cardiovascular: Positive for leg swelling.  Gastrointestinal: Negative.   Musculoskeletal: Negative.   Neurological: Negative.   Psychiatric/Behavioral: Negative.  All other systems reviewed and are negative.    PHYSICAL EXAM: VS:  BP (!) 150/84 (BP Location: Left Arm, Patient Position: Sitting, Cuff Size: Normal)    Pulse 62    Ht 5' (1.524 m)    Wt 184 lb (83.5 kg)    SpO2 95%    BMI 35.94 kg/m  , BMI Body mass index is 35.94 kg/m. Constitutional:  oriented to person, place, and time. No distress.  HENT:  Head: Grossly normal Eyes:  no discharge. No scleral icterus.  Neck: No JVD, no carotid bruits  Cardiovascular: Regular rate and rhythm, no murmurs appreciated Pulmonary/Chest: Clear to auscultation bilaterally, no wheezes or rails Abdominal: Soft.  no distension.  no tenderness.  Musculoskeletal: Normal range of motion Neurological:  normal muscle tone. Coordination normal. No atrophy Skin: Skin warm and dry Psychiatric: normal affect, pleasant  Recent Labs: 06/26/2020: ALT 18 07/02/2020: BUN 11; Creatinine, Ser 0.99; Potassium 4.4; Sodium 139 07/08/2020: Hemoglobin 10.8; Platelets 272.0    Lipid Panel Lab Results  Component Value Date   CHOL 116 05/15/2020   HDL 21.90 (L) 05/15/2020   LDLCALC 50 05/30/2019   TRIG 207.0 (H) 05/15/2020      Wt  Readings from Last 3 Encounters:  10/17/20 184 lb (83.5 kg)  10/14/20 179 lb 9.6 oz (81.5 kg)  07/08/20 184 lb (83.5 kg)      ASSESSMENT AND PLAN:  Essential hypertension -  For unclear reasons is taking metoprolol tartrate once a day, we will change this to metoprolol succinate 25 daily No other medication changes made 6 pound weight loss over the past year may be contributing to improved blood pressure  Tachycardia -  Metoprolol as above Denies significant breakthrough arrhythmia  Type 2 diabetes mellitus with hyperglycemia, without long-term current use of insulin (HCC) No regular exercise program Recommend continue diet restriction, A1c 6.4  Obesity Water aerobics, low carbohydrate diet, try to stay active  HLD (hyperlipidemia) Tolerating simvastatin, zetia Numbers well controlled  Anemia History of iron deficiency, followed by primary care Recent numbers low but stable hemoglobin 10.8  Acute renal failure Numbers now back to baseline, off her diuretic, recommend she try to avoid hot sun and dehydration, liberalize her fluid intake in the hot weather   Total encounter time more than 25 minutes  Greater than 50% was spent in counseling and coordination of care with the patient    Orders Placed This Encounter  Procedures   EKG 12-Lead   Signed, Esmond Plants, M.D., Ph.D. 10/17/2020  [place], [place]

## 2020-10-17 ENCOUNTER — Encounter: Payer: Self-pay | Admitting: Cardiovascular Disease

## 2020-10-17 ENCOUNTER — Ambulatory Visit (INDEPENDENT_AMBULATORY_CARE_PROVIDER_SITE_OTHER): Payer: Medicare Other | Admitting: Cardiovascular Disease

## 2020-10-17 ENCOUNTER — Other Ambulatory Visit: Payer: Self-pay

## 2020-10-17 VITALS — BP 150/84 | HR 62 | Ht 60.0 in | Wt 184.0 lb

## 2020-10-17 DIAGNOSIS — I25118 Atherosclerotic heart disease of native coronary artery with other forms of angina pectoris: Secondary | ICD-10-CM | POA: Diagnosis not present

## 2020-10-17 DIAGNOSIS — I10 Essential (primary) hypertension: Secondary | ICD-10-CM

## 2020-10-17 DIAGNOSIS — C9110 Chronic lymphocytic leukemia of B-cell type not having achieved remission: Secondary | ICD-10-CM

## 2020-10-17 DIAGNOSIS — E782 Mixed hyperlipidemia: Secondary | ICD-10-CM

## 2020-10-17 DIAGNOSIS — E1165 Type 2 diabetes mellitus with hyperglycemia: Secondary | ICD-10-CM

## 2020-10-17 MED ORDER — METOPROLOL SUCCINATE ER 50 MG PO TB24: 25.0000 mg | ORAL_TABLET | Freq: Every day | ORAL | 3 refills | Status: DC

## 2020-10-17 NOTE — Patient Instructions (Addendum)
Medication Instructions:  We will change metoprolol tartrate to metoprolol succinate 25 mg daily. This will be a half pill taken by mouth daily. You may pcik this up at your pharmacy   If you need a refill on your cardiac medications before your next appointment, please call your pharmacy.    Lab work: No new labs needed   If you have labs (blood work) drawn today and your tests are completely normal, you will receive your results only by:  [facility] (if you have MyChart) OR  A paper copy in the mail If you have any lab test that is abnormal or we need to change your treatment, we will call you to review the results.   Testing/Procedures: No new testing needed   Follow-Up: At [facility], you and your health needs are our priority.  As part of our continuing mission to provide you with exceptional heart care, we have created designated Provider Care Teams.  These Care Teams include your primary Cardiologist (physician) and Advanced Practice Providers (APPs -  Physician Assistants and Nurse Practitioners) who all work together to provide you with the care you need, when you need it.   You will need a follow up appointment in 12 months   Providers on your designated Care Team:    Murray Hodgkins, NP  Christell Faith, PA-C  Marrianne Mood, PA-C  Any Other Special Instructions Will Be Listed Below (If Applicable).  COVID-19 Vaccine Information can be found at: ShippingScam.co.uk For questions related to vaccine distribution or appointments, please email [email or web address] or call [phone].

## 2020-11-02 ENCOUNTER — Other Ambulatory Visit: Payer: Self-pay | Admitting: Cardiovascular Disease

## 2020-11-10 ENCOUNTER — Encounter: Payer: Self-pay | Admitting: Internal Medicine

## 2020-11-11 ENCOUNTER — Encounter: Payer: Self-pay | Admitting: Internal Medicine

## 2020-11-11 ENCOUNTER — Ambulatory Visit (INDEPENDENT_AMBULATORY_CARE_PROVIDER_SITE_OTHER): Payer: Medicare Other | Admitting: Internal Medicine

## 2020-11-11 ENCOUNTER — Other Ambulatory Visit: Payer: Self-pay

## 2020-11-11 VITALS — BP 140/100 | HR 75 | Ht 60.0 in | Wt 185.0 lb

## 2020-11-11 DIAGNOSIS — E1165 Type 2 diabetes mellitus with hyperglycemia: Secondary | ICD-10-CM

## 2020-11-11 DIAGNOSIS — Z6835 Body mass index (BMI) 35.0-35.9, adult: Secondary | ICD-10-CM | POA: Diagnosis not present

## 2020-11-11 DIAGNOSIS — E782 Mixed hyperlipidemia: Secondary | ICD-10-CM | POA: Diagnosis not present

## 2020-11-11 LAB — POCT GLYCOSYLATED HEMOGLOBIN (HGB A1C): Hemoglobin A1C: 7.6 % — AB (ref 4.0–5.6)

## 2020-11-11 MED ORDER — ONETOUCH VERIO VI STRP: ORAL_STRIP | 1 refills | Status: DC

## 2020-11-11 MED ORDER — DAPAGLIFLOZIN PROPANEDIOL 5 MG PO TABS
5.0000 mg | ORAL_TABLET | Freq: Every day | ORAL | 3 refills | Status: DC
Start: 2020-11-11 — End: 2020-11-12

## 2020-11-11 NOTE — Addendum Note (Signed)
Addended by: Lauralyn Primes on: 11/11/2020 04:58 PM   Modules accepted: Orders

## 2020-11-11 NOTE — Patient Instructions (Addendum)
Please continue: - Lantus 24 units after dinner - Glipizide XR 2.5 mg before b'fast and 5 mg before dinner  Try to start: - Farxiga 5 mg before b'fast   In 1-2 weeks, try to stop am Glipizide.  Please return in 3-4 months with your sugar log.

## 2020-11-11 NOTE — Progress Notes (Signed)
Patient ID: Brenda [name], female   DOB: 1951/03/15, 70 y.o.   MRN: [id number]  This visit occurred during the SARS-CoV-2 public health emergency.  Safety protocols were in place, including screening questions prior to the visit, additional usage of staff PPE, and extensive cleaning of exam room while observing appropriate contact time as indicated for disinfecting solutions.   HPI: Brenda [name] is a 70 y.o.-year-old female, presenting for follow-up for DM2, dx in 2007, insulin-dependent, uncontrolled, with complications (CKD). Last visit 4 months ago.  Before last visit, she had shoulder surgery and 12/2019. She recovered afterwards.  Reviewed HbA1c levels: Lab Results  Component Value Date   HGBA1C 6.4 (A) 07/08/2020   HGBA1C 7.7 (H) 05/15/2020   HGBA1C 6.9 (H) 01/10/2020   Patient is on: - Lantus 12 units in a.m.- started 05/2019 by PCP >> 24 units after dinner - Glipizide 2.5 >> 5 mg >> 2.5-5 mg >> glipizide XR  2.5 mg in am and 5 mg before a large dinner She was on Glipizide in 11/2015 after a steroid inj. We stopped Victoza due to nausea. Metformin ER was stopped by PCP due to CKD. Glipizide ER was stopped 06/2019 due to low blood sugars.   Pt checks her sugars twice a day and they are still fluctuating, especially during the holidays: - am:127-173, 180 >> 99-151, 166 >> 63, 89-200 - 2h after b'fast: 156 >> n/c >> 148 >> n/c - before lunch: 85-147 >> 70-132, 155 >> 58, 64-164 - 2h after lunch: 118, 123 >> 92, 99 >> n/c - before dinner: 67, 81-158, 162 >> 80-143, 151 >> 62-160, 170 - 2h after dinner:n/c >> 138, 148 >> n/c - bedtime: 91-143 >> n/c >> 107-128 >> 149-164 - nighttime: n/c Lowest sugar was 37 x1 >> ... >> 70 >> 58; she has hypoglycemia awareness in the 70s. Highest sugar was 198 >> 166 (200s before changing Glipizide IR >> ER) >> 200  Glucometer: Freestyle Lite  Pt's meals are: - Breakfast: English muffin + preserve or cereal or yoghurt - snack: fruit  or fruit + yoghurt - Lunch: 1/2 sandwich + chips + salsa + fruit/sugar free >> protein bar - Dinner: soup or chicken/steak + veggies, occasional starch or Lean cuisine or light salad No soft drinks.  -+ CKD, last BUN/creatinine:  Lab Results  Component Value Date   BUN 11 07/02/2020   CREATININE 0.99 07/02/2020  On benazepril.  Sees nephrology. She had a high potassium (5.9) >> used Kayexalate x3 >> normalized.  She is on a low potassium diet. -+ HL last set of lipids: Lab Results  Component Value Date   CHOL 116 05/15/2020   HDL 21.90 (L) 05/15/2020   LDLCALC 50 05/30/2019   LDLDIRECT 55.0 05/15/2020   TRIG 207.0 (H) 05/15/2020   CHOLHDL 5 05/15/2020  On Zocor and Zetia. - last eye exam 03/2020: No DR; she had cataract surgery in 2016.  She has a history of retinal tear. - no numbness and tingling in her feet.  She takes Neurontin for leg cramps at night.  She also has CLL, HTN, GERD, depression.  She was admitted 11/2017 for dehydration, anemia, and acute renal failure >> diagnosed with CLL.  ROS: Constitutional: no weight gain/no weight loss, no fatigue, no subjective hyperthermia, no subjective hypothermia Eyes: no blurry vision, no xerophthalmia ENT: no sore throat, no nodules palpated in neck, no dysphagia, no odynophagia, no hoarseness Cardiovascular: no CP/no SOB/no palpitations/+ leg swelling Respiratory: no cough/no SOB/no wheezing  Gastrointestinal: no N/no V/no D/no C/no acid reflux Musculoskeletal: no muscle aches/no joint aches Skin: no rashes, no hair loss Neurological: no tremors/no numbness/no tingling/no dizziness  I reviewed pt's medications, allergies, PMH, social hx, family hx, and changes were documented in the history of present illness. Otherwise, unchanged from my initial visit note.  Past Medical History:  Diagnosis Date  . Anxiety   . Arthritis   . CLL (chronic lymphocytic leukemia) (HCC)    Dx. 2019 Dr. Irene Limbo'  . Depression    Wellbutrin  .  Diabetes mellitus    Type II  . Endometrial ca ([place])    1998  . 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 GERD (gastroesophageal reflux disease)   . Heart murmur    patient states "cardiologist has not heard heart murmur for past several years"  . Heel spur   . History of hiatal hernia    small  . 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 Hyperkalemia   . Hyperlipidemia   . Hypertension   . Left leg swelling    "swelling in left leg from knee down when sitting for prolonged periods or being on feet for a long period of time"  . PONV (postoperative nausea and vomiting)    after hysterectomy  . Renal insufficiency    Stage 3 b   Dr. Particia Nearing first visit 11-2019   Past Surgical History:  Procedure Laterality Date  . ABDOMINAL HYSTERECTOMY  1998  . COLONOSCOPY    . EYE SURGERY Bilateral    cataracts  . EYE SURGERY Left    retina tear  . FEMUR IM NAIL  10/25/2012   Procedure: INTRAMEDULLARY (IM) NAIL FEMORAL;  Surgeon: Mauri Pole, MD;  Location: [facility];  Service: Orthopedics;  Laterality: Left;  . HIP SURGERY     Fracture L IM nail and 3 rods  . left knee arthroscopy  12/2010  . LYMPH NODE BIOPSY     axillary left 12-2018  . REFRACTIVE SURGERY    . skin cancer removed right and and lower forearm     squamoun in situ  . TOTAL KNEE ARTHROPLASTY  12/20/2011   Procedure: TOTAL KNEE ARTHROPLASTY;  Surgeon: Gearlean Alf, MD;  Location: [facility];  Service: Orthopedics;  Laterality: Left;  Failed attempt at spinal   . TOTAL SHOULDER ARTHROPLASTY Right 08/19/2016   Procedure: RIGHT TOTAL SHOULDER ARTHROPLASTY;  Surgeon: Justice Britain, MD;  Location: [place];  Service: Orthopedics;  Laterality: Right;  . TOTAL SHOULDER ARTHROPLASTY Left 01/10/2020   Procedure: TOTAL SHOULDER ARTHROPLASTY;  Surgeon: Justice Britain, MD;  Location: [facility];  Service: Orthopedics;   Laterality: Left;  178mn   Social History   Social History  . Marital Status: Single    Spouse Name: N/A  . Number of Children: 0   Occupational History  . RN at [employer]  . Smoking status: Never Smoker   . Smokeless tobacco: Never Used  . Alcohol Use: No  . Drug Use: No   Social History Narrative   [facility] [employer] [facility]   Current Outpatient Medications on File Prior to Visit  Medication Sig Dispense Refill  . acetaminophen (TYLENOL) 500 MG tablet Take 500-1,000 mg by mouth every 6 (six) hours as needed for mild pain, moderate pain or fever.     .Marland Kitchen  ALPRAZolam (XANAX) 1 MG tablet TAKE 1/2 TO 1 TABLET(0.5 TO 1 MG) BY MOUTH TWICE DAILY AS NEEDED FOR ANXIETY 60 tablet 2  . aspirin 81 MG tablet Take 81 mg by mouth every other day.     . BD PEN NEEDLE NANO 2ND GEN 32G X 4 MM MISC USE DAILY AS DIRECTED 100 each 3  . Blood Glucose Monitoring Suppl (ONETOUCH VERIO) w/Device KIT Use as advised 1 kit 0  . buPROPion (WELLBUTRIN XL) 300 MG 24 hr tablet Take 1 tablet (300 mg total) by mouth daily. 90 tablet 3  . calcium-vitamin D (OSCAL WITH D) 500-200 MG-UNIT tablet Take 1 tablet by mouth. With vtamin E    . Cholecalciferol (VITAMIN D3) 2000 units TABS Take 2,000 mcg by mouth daily.    . Cyanocobalamin (B-12) 1000 MCG SUBL Place 1,000 mcg under the tongue every evening.     Marland Kitchen esomeprazole (NEXIUM) 20 MG capsule Take 20 mg by mouth daily as needed (acid reflux symptoms).     . ezetimibe (ZETIA) 10 MG tablet TAKE 1 TABLET(10 MG) BY MOUTH DAILY 90 tablet 3  . glipiZIDE (GLUCOTROL XL) 2.5 MG 24 hr tablet Take 1 tablet in the AM and 2 tablets in the evening    . glucose blood (ONETOUCH VERIO) test strip USE TO CHECK BLOOD SUGAR THREE TIMES DAILY 300 strip 1  . insulin glargine (LANTUS SOLOSTAR) 100 UNIT/ML Solostar Pen Inject 22-24 Units into the skin at bedtime. 18 mL 3  . iron polysaccharides (NIFEREX) 150 MG capsule Take 150 mg by mouth at bedtime.     Elmore Guise  Devices (LANCING DEVICE) MISC Use as advised - Freestyle 1 each 0  . lisinopril (ZESTRIL) 20 MG tablet Take 1 tablet (20 mg total) by mouth daily. 90 tablet 3  . metoprolol succinate (TOPROL-XL) 50 MG 24 hr tablet Take 0.5 tablets (25 mg total) by mouth daily. Take with or immediately following a meal. 45 tablet 3  . NONFORMULARY OR COMPOUNDED ITEM Apply 1 application topically 2 (two) times daily as needed (sun damage on face). FLUOROURACIL 5% + CALCIPOTRIENE 0.005%    . ondansetron (ZOFRAN) 4 MG tablet Take 1 tablet (4 mg total) by mouth every 8 (eight) hours as needed for nausea or vomiting. 20 tablet 0  . simvastatin (ZOCOR) 40 MG tablet TAKE 1 TABLET(40 MG) BY MOUTH AT BEDTIME 90 tablet 0  . triamcinolone cream (KENALOG) 0.1 % Apply 1 application topically 2 (two) times daily as needed (skin rash.).      No current facility-administered medications on file prior to visit.   Allergies  Allergen Reactions  . Amlodipine     Swelling    . Nsaids     Due to kidney function   Family History  Problem Relation Age of Onset  . Cancer Mother        breast cancer  . Cancer Father        plasma sarcoma  . Breast cancer Neg Hx    PE: BP (!) 140/100   Pulse 75   Ht 5' (1.524 m)   Wt 185 lb (83.9 kg)   SpO2 97%   BMI 36.13 kg/m  Body mass index is 36.13 kg/m. Wt Readings from Last 3 Encounters:  11/11/20 185 lb (83.9 kg)  10/17/20 184 lb (83.5 kg)  10/14/20 179 lb 9.6 oz (81.5 kg)   Constitutional: overweight, in NAD Eyes: PERRLA, EOMI, no exophthalmos ENT: moist mucous membranes, no thyromegaly, no cervical lymphadenopathy Cardiovascular: RRR,  No MRG, + LLE edema-chronic Respiratory: CTA B Gastrointestinal: abdomen soft, NT, ND, BS+ Musculoskeletal: no deformities, strength intact in all 4 Skin: moist, warm, no rashes Neurological: no tremor with outstretched hands, DTR normal in all 4  ASSESSMENT: 1. DM2, insulin-dependent, uncontrolled, with complications  - mild  CKD  2. Obesity class 2  3. HL  PLAN:  1. Patient with longstanding, uncontrolled, type 2 diabetes, with much improved control since the summer above 2020, after addition of insulin.  At that time, metformin was stopped due to worsening kidney function.  She was on Victoza in the past but stopped due to nausea.  She was reticent to try another GLP-1 receptor agonist afterwards.  At last visit, sugars were better, especially after switching from glipizide IR to XR after she contacted me with high blood sugars.  She was taking the lower dose of Lantus since she was not eating well at last visit and we discussed that she may need a higher dose when she starts tolerating her meals well.  She occasionally has higher blood sugars, in the 150-160s but the majority of her blood sugars were at goal.  HbA1c was much better, at 6.4%.  We did not change her regimen at that time. -At this visit, especially since the blood sugars are fluctuating with more hyperglycemic spikes along with occasional low blood sugars, we discussed about the addition of an SGLT2 inhibitor. I explained the benefits and the possible side effects. We will start a low-dose of Farxiga and I advised her that after we start the medication, she can try to stop the a.m. glipizide. I explained that this medication will also help her blood pressure, which started to increase, after she was taken off HCTZ. - I suggested to:  Patient Instructions  Please continue: - Lantus 24 units after dinner - Glipizide XR 2.5 mg before b'fast and 5 mg before dinner  Try to start: - Farxiga 5 mg before b'fast   In 1-2 weeks, try to stop am Glipizide.  Please return in 3-4 months with your sugar log.  - we checked her HbA1c: 7.6% (higher) - advised to check sugars at different times of the day - 1x a day, rotating check times - advised for yearly eye exams >> she is UTD - return to clinic in 3-4 months     2. Obesity class 2 -At last visit, she was  having problems eating the right foods since she also had to limit potassium -She lost 2 pounds before last visit, but weight is stable since then -We will try to start Farxiga, which should also help with weight loss  3.  Hyperlipidemia -Reviewed latest lipid panel from 05/2020: LDL at goal, triglycerides high, HDL low: Lab Results  Component Value Date   CHOL 116 05/15/2020   HDL 21.90 (L) 05/15/2020   LDLCALC 50 05/30/2019   LDLDIRECT 55.0 05/15/2020   TRIG 207.0 (H) 05/15/2020   CHOLHDL 5 05/15/2020  -Continue Zocor 40 mg daily and Zetia 10 mg daily without side effects   Philemon Kingdom, MD PhD [facility] Endocrinology

## 2020-11-12 ENCOUNTER — Telehealth: Payer: Self-pay | Admitting: Hematology

## 2020-11-12 ENCOUNTER — Other Ambulatory Visit: Payer: Self-pay

## 2020-11-12 ENCOUNTER — Inpatient Hospital Stay: Payer: Medicare Other

## 2020-11-12 ENCOUNTER — Telehealth: Payer: Self-pay

## 2020-11-12 ENCOUNTER — Inpatient Hospital Stay: Payer: Medicare Other | Attending: Hematology | Admitting: Hematology

## 2020-11-12 ENCOUNTER — Other Ambulatory Visit: Payer: Self-pay | Admitting: Internal Medicine

## 2020-11-12 VITALS — BP 142/69 | HR 69 | Temp 97.6°F | Resp 17 | Ht 60.0 in | Wt 185.0 lb

## 2020-11-12 DIAGNOSIS — Z8542 Personal history of malignant neoplasm of other parts of uterus: Secondary | ICD-10-CM | POA: Insufficient documentation

## 2020-11-12 DIAGNOSIS — Z7982 Long term (current) use of aspirin: Secondary | ICD-10-CM | POA: Insufficient documentation

## 2020-11-12 DIAGNOSIS — I10 Essential (primary) hypertension: Secondary | ICD-10-CM | POA: Diagnosis not present

## 2020-11-12 DIAGNOSIS — F418 Other specified anxiety disorders: Secondary | ICD-10-CM | POA: Diagnosis not present

## 2020-11-12 DIAGNOSIS — C9110 Chronic lymphocytic leukemia of B-cell type not having achieved remission: Secondary | ICD-10-CM | POA: Insufficient documentation

## 2020-11-12 DIAGNOSIS — E875 Hyperkalemia: Secondary | ICD-10-CM | POA: Insufficient documentation

## 2020-11-12 DIAGNOSIS — Z90710 Acquired absence of both cervix and uterus: Secondary | ICD-10-CM | POA: Diagnosis not present

## 2020-11-12 DIAGNOSIS — Z79899 Other long term (current) drug therapy: Secondary | ICD-10-CM | POA: Diagnosis not present

## 2020-11-12 DIAGNOSIS — E119 Type 2 diabetes mellitus without complications: Secondary | ICD-10-CM | POA: Insufficient documentation

## 2020-11-12 DIAGNOSIS — Z803 Family history of malignant neoplasm of breast: Secondary | ICD-10-CM | POA: Insufficient documentation

## 2020-11-12 LAB — CMP (CANCER CENTER ONLY)
ALT: 14 U/L (ref 0–44)
AST: 17 U/L (ref 15–41)
Albumin: 4.2 g/dL (ref 3.5–5.0)
Alkaline Phosphatase: 86 U/L (ref 38–126)
Anion gap: 7 (ref 5–15)
BUN: 41 mg/dL — ABNORMAL HIGH (ref 8–23)
CO2: 25 mmol/L (ref 22–32)
Calcium: 9.6 mg/dL (ref 8.9–10.3)
Chloride: 108 mmol/L (ref 98–111)
Creatinine: 1.63 mg/dL — ABNORMAL HIGH (ref 0.44–1.00)
GFR, Estimated: 34 mL/min — ABNORMAL LOW (ref >60)
Glucose, Bld: 141 mg/dL — ABNORMAL HIGH (ref 70–99)
Potassium: 5.2 mmol/L — ABNORMAL HIGH (ref 3.5–5.1)
Sodium: 140 mmol/L (ref 135–145)
Total Bilirubin: 0.9 mg/dL (ref 0.3–1.2)
Total Protein: 6.6 g/dL (ref 6.5–8.1)

## 2020-11-12 LAB — CBC WITH DIFFERENTIAL/PLATELET
Abs Immature Granulocytes: 0.04 10*3/uL (ref 0.00–0.07)
Basophils Absolute: 0.1 10*3/uL (ref 0.0–0.1)
Basophils Relative: 1 %
Eosinophils Absolute: 0.3 10*3/uL (ref 0.0–0.5)
Eosinophils Relative: 3 %
HCT: 41 % (ref 36.0–46.0)
Hemoglobin: 12.8 g/dL (ref 12.0–15.0)
Immature Granulocytes: 0 %
Lymphocytes Relative: 59 %
Lymphs Abs: 7.2 10*3/uL — ABNORMAL HIGH (ref 0.7–4.0)
MCH: 27.5 pg (ref 26.0–34.0)
MCHC: 31.2 g/dL (ref 30.0–36.0)
MCV: 88 fL (ref 80.0–100.0)
Monocytes Absolute: 0.7 10*3/uL (ref 0.1–1.0)
Monocytes Relative: 6 %
Neutro Abs: 3.7 10*3/uL (ref 1.7–7.7)
Neutrophils Relative %: 31 %
Platelets: 170 10*3/uL (ref 150–400)
RBC: 4.66 MIL/uL (ref 3.87–5.11)
RDW: 13.8 % (ref 11.5–15.5)
WBC: 12.1 10*3/uL — ABNORMAL HIGH (ref 4.0–10.5)
nRBC: 0 % (ref 0.0–0.2)

## 2020-11-12 LAB — LACTATE DEHYDROGENASE: LDH: 191 U/L (ref 98–192)

## 2020-11-12 MED ORDER — EMPAGLIFLOZIN 10 MG PO TABS
10.0000 mg | ORAL_TABLET | Freq: Every day | ORAL | 11 refills | Status: DC
Start: 2020-11-12 — End: 2021-11-06

## 2020-11-12 NOTE — Telephone Encounter (Signed)
Looking through the chart, I am trying to avoid a medication that might hurt her kidneys or give her leg swelling Would suggest we start Cardura 1 mg twice daily Again as with any medication she could start 1 mg daily for several days to see if this improves her pressure If no improvement then would go up to 1 mg twice a day Maximum on this medication would be 8 mg twice a day Typically I do not get much side effect such as swelling or any kidney problem

## 2020-11-12 NOTE — Telephone Encounter (Signed)
Patient calling to check status of advise

## 2020-11-12 NOTE — Progress Notes (Signed)
HEMATOLOGY/ONCOLOGY CLINIC NOTE  Date of Service: 11/12/20    Patient Care Team: Ronnald Nian, DO as PCP - General (Family Medicine) Rockey Situ Kathlene November, MD as PCP - Cardiology (Cardiology) Minna Merritts, MD as Consulting Physician (Cardiology)  CHIEF COMPLAINTS/PURPOSE OF CONSULTATION:  F/u for continue mx of CLL  HISTORY OF PRESENTING ILLNESS:   Brenda [name] is a wonderful 70 y.o. female who was hospitalized on 11/17/17 for abdominal discomfort, nausea and anorexia.   Further workup showed elevated kidney function tests and CT of the abd and pelvis demonstrated pelvic lymphadenopathy and innumerable pulmonary nodulesin the bases concerning for metastases.  Today she is here for an outpatient follow up accompanied by her friend. She notes she has not felt her usual self in the past 2 months with fatigue and lack of appetite. She also noticed diarrhea that comes and goes and her energy level would continue to drop. She has lost 25-30 pounds since end of summer 2018. She will notice mucous with her stool but no bloody or black stool. She does not take any meds that she would associate with her diarrhea or lower appetite and she denies change in her chronic medications. She has been on Nexium for a long period of time. She notes she does not often get sick. Dr. Deborra Medina is her PCP. She was using 22m ibuprofen 2-3 times a day more often recently due to her hip pain.   She notes her mother had breast cancer and diagnosed at 565yo. Her father had a rare form of cancer who died 3 weeks after diagnosis in his 868s Her brother passed from non-alcohol liver cirrhosis.   On review of symptoms, pt notes since her discharge she no longer has abdominal pain but will still have occasional diarrhea. She has not had diarrhea in several days. She notes to having a slight cough but mostly sinus drainage. Her appetite and eating has improved. She notes due to her knee replacement and hip fracture  she will have swelling in her left leg. She has lower back pain at times. She denies feeling lumps or bumps in her axilla b/l or groin. She denies dysuria or change in her urination.   Interval History:  Brenda DEGNERreturns today regarding her Chronic Lymphocytic Leukemia. The patient's last visit with [place] on 05/12/2020. The pt reports that she is doing well overall.  The pt reports she was admitted in 06/2020 with AKI due to dehydration and multiple rib fractures following a fall.  Of note since the patient's last visit, pt has had CT C/A/P ([id number] ([id number] completed on 06/22/2020 with results revealing "1. Acute nondisplaced anterior left fifth, sixth, seventh and eighth rib fractures. No pneumothorax 2. No acute traumatic injury in the abdomen or pelvis. 3. CT findings suggestive of acute appendicitis. No free air or measurable abscess on this noncontrast scan. Surgical consultation advised. 4. Widespread lymphadenopathy, increased since 05/01/2018 CT as detailed, compatible with progression of lymphoma. 5. Numerous nonspecific chronic solid pulmonary nodules scattered throughout both lungs, mildly increased, also potentially due to lymphoma. 6. Stable mild cardiomegaly. Three-vessel coronary atherosclerosis. 7. Cholelithiasis. 8. Aortic Atherosclerosi."  Lab results today (11/12/20) reviewed with patient.  No fevers/chills/night sweats. Denies any new LNadenopathy.  MEDICAL HISTORY:  Past Medical History:  Diagnosis Date  . Anxiety   . Arthritis   . CLL (chronic lymphocytic leukemia) (HCC)    Dx. 2019 Dr. KIrene Limbo  . Depression    Wellbutrin  .  Diabetes mellitus    Type II  . Endometrial ca ([place])    1998  . 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 GERD (gastroesophageal reflux disease)   . Heart murmur    patient states "cardiologist has not heard heart murmur for past several years"  . Heel spur   . History of hiatal hernia     small  . 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 Hyperkalemia   . Hyperlipidemia   . Hypertension   . Left leg swelling    "swelling in left leg from knee down when sitting for prolonged periods or being on feet for a long period of time"  . PONV (postoperative nausea and vomiting)    after hysterectomy  . Renal insufficiency    Stage 3 b   Dr. Particia Nearing first visit 11-2019    SURGICAL HISTORY: Past Surgical History:  Procedure Laterality Date  . ABDOMINAL HYSTERECTOMY  1998  . COLONOSCOPY    . EYE SURGERY Bilateral    cataracts  . EYE SURGERY Left    retina tear  . FEMUR IM NAIL  10/25/2012   Procedure: INTRAMEDULLARY (IM) NAIL FEMORAL;  Surgeon: Mauri Pole, MD;  Location: [facility];  Service: Orthopedics;  Laterality: Left;  . HIP SURGERY     Fracture L IM nail and 3 rods  . left knee arthroscopy  12/2010  . LYMPH NODE BIOPSY     axillary left 12-2018  . REFRACTIVE SURGERY    . skin cancer removed right and and lower forearm     squamoun in situ  . TOTAL KNEE ARTHROPLASTY  12/20/2011   Procedure: TOTAL KNEE ARTHROPLASTY;  Surgeon: Gearlean Alf, MD;  Location: [facility];  Service: Orthopedics;  Laterality: Left;  Failed attempt at spinal   . TOTAL SHOULDER ARTHROPLASTY Right 08/19/2016   Procedure: RIGHT TOTAL SHOULDER ARTHROPLASTY;  Surgeon: Justice Britain, MD;  Location: [facility];  Service: Orthopedics;  Laterality: Right;  . TOTAL SHOULDER ARTHROPLASTY Left 01/10/2020   Procedure: TOTAL SHOULDER ARTHROPLASTY;  Surgeon: Justice Britain, MD;  Location: [facility];  Service: Orthopedics;  Laterality: Left;  175mn    SOCIAL HISTORY: Social History   Socioeconomic History  . Marital status: Single    Spouse name: Not on file  . Number of children: Not on file  . Years of education: Not on file  . Highest education level: Not on file  Occupational History  . 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 Smoking status: Never Smoker  . Smokeless tobacco: Never Used  Vaping Use  . Vaping Use: Never used  Substance and Sexual Activity  . Alcohol use: No  . Drug use: No  . Sexual activity: Not Currently  Other Topics Concern  . 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 Difficulty of Paying Living Expenses: Not hard at all  Food Insecurity: No Food Insecurity  . Worried About RCharity fundraiserin the Last Year: Never true  . Ran Out of Food in the Last Year: Never true  Transportation Needs: No Transportation Needs  . Lack of  Transportation (Medical): No  . Lack of Transportation (Non-Medical): No  Physical Activity: Inactive  . Days of Exercise per Week: 0 days  . Minutes of Exercise per Session: 0 min  Stress: No Stress Concern Present  . Feeling of Stress : Not at all  Social Connections: Moderately Isolated  . Frequency of Communication with Friends and Family: More than three times a week  . Frequency of Social Gatherings with Friends and Family: More than three times a week  . Attends Religious Services: More than 4 times per year  . Active Member of Clubs or Organizations: No  . Attends Archivist Meetings: Never  . Marital Status: Never married  Intimate Partner Violence: Not At Risk  . Fear of Current or Ex-Partner: No  . Emotionally Abused: No  . Physically Abused: No  . Sexually Abused: No    FAMILY HISTORY: Family History  Problem Relation Age of Onset  . Cancer Mother        breast cancer  . Cancer Father        plasma sarcoma  . Breast cancer Neg Hx     ALLERGIES:  is allergic to amlodipine and nsaids.  MEDICATIONS:  Current Outpatient Medications  Medication Sig Dispense Refill  . acetaminophen (TYLENOL) 500 MG tablet Take 500-1,000 mg by mouth every 6 (six) hours as needed for mild pain,  moderate pain or fever.     . ALPRAZolam (XANAX) 1 MG tablet TAKE 1/2 TO 1 TABLET(0.5 TO 1 MG) BY MOUTH TWICE DAILY AS NEEDED FOR ANXIETY 60 tablet 2  . aspirin 81 MG tablet Take 81 mg by mouth every other day.     . BD PEN NEEDLE NANO 2ND GEN 32G X 4 MM MISC USE DAILY AS DIRECTED 100 each 3  . Blood Glucose Monitoring Suppl (ONETOUCH VERIO) w/Device KIT Use as advised 1 kit 0  . buPROPion (WELLBUTRIN XL) 300 MG 24 hr tablet Take 1 tablet (300 mg total) by mouth daily. 90 tablet 3  . calcium-vitamin D (OSCAL WITH D) 500-200 MG-UNIT tablet Take 1 tablet by mouth. With vtamin E    . Cholecalciferol (VITAMIN D3) 2000 units TABS Take 2,000 mcg by mouth daily.    . Cyanocobalamin (B-12) 1000 MCG SUBL Place 1,000 mcg under the tongue every evening.     . dapagliflozin propanediol (FARXIGA) 5 MG TABS tablet Take 1 tablet (5 mg total) by mouth daily before breakfast. 90 tablet 3  . esomeprazole (NEXIUM) 20 MG capsule Take 20 mg by mouth daily as needed (acid reflux symptoms).     . ezetimibe (ZETIA) 10 MG tablet TAKE 1 TABLET(10 MG) BY MOUTH DAILY 90 tablet 3  . glipiZIDE (GLUCOTROL XL) 2.5 MG 24 hr tablet Take 1 tablet in the AM and 2 tablets in the evening    . glucose blood (ONETOUCH VERIO) test strip USE TO CHECK BLOOD SUGAR THREE TIMES DAILY 300 strip 1  . insulin glargine (LANTUS SOLOSTAR) 100 UNIT/ML Solostar Pen Inject 22-24 Units into the skin at bedtime. 18 mL 3  . iron polysaccharides (NIFEREX) 150 MG capsule Take 150 mg by mouth at bedtime.     Elmore Guise Devices (LANCING DEVICE) MISC Use as advised - Freestyle 1 each 0  . lisinopril (ZESTRIL) 20 MG tablet Take 1 tablet (20 mg total) by mouth daily. 90 tablet 3  . metoprolol succinate (TOPROL-XL) 50 MG 24 hr tablet Take 0.5 tablets (25 mg total) by mouth daily. Take with or immediately following  a meal. 45 tablet 3  . NONFORMULARY OR COMPOUNDED ITEM Apply 1 application topically 2 (two) times daily as needed (sun damage on face). FLUOROURACIL 5%  + CALCIPOTRIENE 0.005%    . ondansetron (ZOFRAN) 4 MG tablet Take 1 tablet (4 mg total) by mouth every 8 (eight) hours as needed for nausea or vomiting. 20 tablet 0  . simvastatin (ZOCOR) 40 MG tablet TAKE 1 TABLET(40 MG) BY MOUTH AT BEDTIME 90 tablet 0  . triamcinolone cream (KENALOG) 0.1 % Apply 1 application topically 2 (two) times daily as needed (skin rash.).      No current facility-administered medications for this visit.    REVIEW OF SYSTEMS:   A 10+ POINT REVIEW OF SYSTEMS WAS OBTAINED including neurology, dermatology, psychiatry, cardiac, respiratory, lymph, extremities, GI, GU, Musculoskeletal, constitutional, breasts, reproductive, HEENT.  All pertinent positives are noted in the HPI.  All others are negative.   PHYSICAL EXAMINATION: ECOG PERFORMANCE STATUS: 1 - Symptomatic but completely ambulatory  Vitals:   11/12/20 0834  BP: (!) 142/69  Pulse: 69  Resp: 17  Temp: 97.6 F (36.4 C)  SpO2: 97%   Filed Weights   11/12/20 0834  Weight: 185 lb (83.9 kg)   .There is no height or weight on file to calculate BMI.  NAD GENERAL:alert, in no acute distress and comfortable SKIN: no acute rashes, no significant lesions EYES: conjunctiva are pink and non-injected, sclera anicteric OROPHARYNX: MMM, no exudates, no oropharyngeal erythema or ulceration NECK: supple, no JVD LYMPH:  Small b/l supraclavicular LN's LUNGS: clear to auscultation b/l with normal respiratory effort HEART: regular rate & rhythm ABDOMEN:  normoactive bowel sounds , non tender, not distended. No palpable hepatosplenomegaly.  Extremity: no pedal edema PSYCH: alert & oriented x 3 with fluent speech NEURO: no focal motor/sensory deficits  LABORATORY DATA:  I have reviewed the data as listed  . CBC Latest Ref Rng & Units 11/12/2020 07/08/2020 07/02/2020  WBC 4.0 - 10.5 K/uL 12.1(H) 13.4(H) 22.8 Repeated and verified X2.(HH)  Hemoglobin 12.0 - 15.0 g/dL 12.8 10.8(L) 10.5(L)  Hematocrit 36.0 - 46.0 % 41.0  34.0(L) 32.8(L)  Platelets 150 - 400 K/uL 170 272.0 281.0  hgb 10.7 ANC 3.2k .Marland Kitchen CBC    Component Value Date/Time   WBC 12.1 (H) 11/12/2020 0758   RBC 4.66 11/12/2020 0758   HGB 12.8 11/12/2020 0758   HGB 11.9 (L) 05/12/2020 0855   HCT 41.0 11/12/2020 0758   PLT 170 11/12/2020 0758   PLT 132 (L) 05/12/2020 0855   MCV 88.0 11/12/2020 0758   MCH 27.5 11/12/2020 0758   MCHC 31.2 11/12/2020 0758   RDW 13.8 11/12/2020 0758   LYMPHSABS 7.2 (H) 11/12/2020 0758   MONOABS 0.7 11/12/2020 0758   EOSABS 0.3 11/12/2020 0758   BASOSABS 0.1 11/12/2020 0758    CMP Latest Ref Rng & Units 11/12/2020 07/02/2020 06/26/2020  Glucose 70 - 99 mg/dL 141(H) 123(H) 156(H)  BUN 8 - 23 mg/dL 41(H) 11 22  Creatinine 0.44 - 1.00 mg/dL 1.63(H) 0.99 1.28(H)  Sodium 135 - 145 mmol/L 140 139 140  Potassium 3.5 - 5.1 mmol/L 5.2(H) 4.4 4.6  Chloride 98 - 111 mmol/L 108 102 107  CO2 22 - 32 mmol/L 25 27 20(L)  Calcium 8.9 - 10.3 mg/dL 9.6 8.5 8.1(L)  Total Protein 6.5 - 8.1 g/dL 6.6 - 5.2(L)  Total Bilirubin 0.3 - 1.2 mg/dL 0.9 - 2.0(H)  Alkaline Phos 38 - 126 U/L 86 - 74  AST 15 - 41 U/L 17 -  20  ALT 0 - 44 U/L 14 - 18   . Lab Results  Component Value Date   LDH 191 11/12/2020      12/15/17 FISH CLL Prognostic Panel:    01/03/19 Left axillary LN biopsy:    RADIOGRAPHIC STUDIES: I have personally reviewed the radiological images as listed and agreed with the findings in the report. No results found.  ASSESSMENT & PLAN:  Brenda [name] is a 70 y.o. 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 Lab Results  Component Value Date   LDH 154 06/23/2020   05/01/18 CT C/A/P revealed Mildly progressive lymphadenopathy in the chest, abdomen, and pelvis, corresponding to  the patient's known CLL, as above. Dominant left external iliac node measures 3.3 cm short axis, previously 2.8cm. Spleen is normal in size. Numerous bilateral pulmonary nodules measuring up to 9 mm, grossly unchanged from 2017. Prior bilateral pleural effusions have resolved.   09/22/18 [place] Bilateral Axilla revealed Ultrasound is performed, showing numerous enlarged LEFT axillary lymph nodes. Largest lymph node in the LEFT axilla has cortical thickening of 9 millimeters. Largest lymph node is 3.3 centimeters in length. Evaluation of the contralateral axilla for comparison demonstrate lymph nodes with cortical thickening. Largest lymph node in the RIGHT axilla is 3.4 x 1.3 centimeters.  01/03/19 Left axillary LN biopsy revealed CLL  2. Multiple pulmonary nodules   CT AP on 11/17/17 - with 1. Pelvic lymphadenopathy and innumerable pulmonary nodules in the lung bases, consistent with metastatic disease. 2. Cholelithiasis without CT evidence of acute cholecystitis. 3.  Aortic atherosclerosis  CT Chest on 11/20/17 - with multiple small lung nodules in LLL that appear stable from prior CT in 12/2015 and are considered benign and Upper lung nodules also appear benign. Also with several borderline enlarged lymph nodes concerning for a lymphoproliferative disorder   3. H/o endometrial cancer in 1998 -localized to uterus -treated with surgery, abdominal hysterectomy  -no evidence of recurrence  4. Microcytic anemia - Fe deficiency  Presented in hospital with Hgb of 6.6 Hgb holding steady s/p 2U PRBC - Fe infusioncompleted 1/19- stool hemoccult negative - suspect this may be due to CKD, but iron deficiency also worrisome for occult GIB  -Tolerated IV iron well  (hgb stable today).  5. Marginal B12 levels in hospital  (Antiparietal celll and anti IF Ab negative) -given one B12 injection in hospital -on replacement PLAN:  -labs stable - reviewed with patient -No lab or clinical evidence of CLL  progression at this time - no indication for treatment of patients CLL at this time. -Recommend pt continue skin screening with a dermatologist every 6 mo to 1 yr.     FOLLOW UP: RTC with Dr Irene Limbo with labs in 6 months   The total time spent in the appt was 20 minutes and more than 50% was on counseling and direct patient cares.  All of the patient's questions were answered with apparent satisfaction. The patient knows to call the clinic with any problems, questions or concerns.   Sullivan Lone MD [place] AAHIVMS [facility] Hematology/Oncology Physician [facility]  (Office):       [phone] (Work cell):  [phone] (Fax):           [phone]  11/12/2020 3:03 AM  I, Alric Quan  Danelle Earthly, am acting as a Education administrator for Dr. Sullivan Lone.   .I have reviewed the above documentation for accuracy and completeness, and I agree with the above. Brunetta Genera MD

## 2020-11-12 NOTE — Telephone Encounter (Signed)
Scheduled appointments per 1/12 los. Spoke to patient who is aware of appointments date and times.

## 2020-11-12 NOTE — Telephone Encounter (Signed)
Pt was concern of HTN since Dec, Dr. Rockey Situ had changed pt's mediation of metoprolol at last visit in Dec, but pt still experiencing HTN. Dr. Rockey Situ advised  ""Looking through the chart, I am trying to avoid a medication that might hurt her kidneys or give her leg swelling Would suggest we start Cardura 1 mg twice daily Again as with any medication she could start 1 mg daily for several days to see if this improves her pressure If no improvement then would go up to 1 mg twice a day Maximum on this medication would be 8 mg twice a day Typically I do not get much side effect such as swelling or any kidney problem"  Sent pt a MyChart back with his response, office currently closed, awaiting on pt's response to see if this is a medication she will like to try then proceed with sending order to pharmacy she prefers.

## 2020-11-13 MED ORDER — DOXAZOSIN MESYLATE 1 MG PO TABS: 1.0000 mg | ORAL_TABLET | Freq: Two times a day (BID) | ORAL | 11 refills | Status: DC

## 2020-11-19 ENCOUNTER — Telehealth: Payer: Self-pay

## 2020-11-19 NOTE — Telephone Encounter (Signed)
Pt recently placed on Cardura 1 mg BID, (with room for titration as needed), filled 11/13/2020. [employer] fax a response saying   "this drug has quantity limits (a limit on the amount of the drug provided at one time). We will not continue to pay for the drug after tyou have received the maximum 30 days supply that we are required to cover unless you obtain a formulary exception".   [employer] asked for a new prescription for alternating drug under the [employer] part-d plan or request a coverage review for pt to continue medication.   Fax was placed on Dr. Donivan Scull desk for review and message was sent to the PharmD pool for guidance.

## 2020-11-24 ENCOUNTER — Telehealth: Payer: Self-pay

## 2020-11-24 MED ORDER — DOXAZOSIN MESYLATE 1 MG PO TABS: 2.0000 mg | ORAL_TABLET | Freq: Every day | ORAL | 11 refills | Status: DC

## 2020-11-24 MED ORDER — DOXAZOSIN MESYLATE 2 MG PO TABS
2.0000 mg | ORAL_TABLET | Freq: Every day | ORAL | 3 refills | Status: DC
Start: 2020-11-24 — End: 2021-03-12

## 2020-11-24 NOTE — Telephone Encounter (Signed)
Samples pf Eliquis was dropped off after pt's visit. Called pt and LM (DPR approved) that there was samples in office she can come pick up. Pt called back, she will come Wed to pick up samples. Also placed a pt assistance form in the bag for her to complete as well.

## 2020-11-24 NOTE — Telephone Encounter (Signed)
Reached out to pt regarding her Cardura, explained to pt her insurance [employer] reached out to [place] about her medication this drug has quantity limits (a limit on the amount of the drug provided at one time). Thye will not continue to pay for the drug after you have received the maximum 30 days supply that we are required to cover unless you obtain a formulary exception", advised pt that they prefer to cover once a day medication with Cardura. Explained that pharmacy and Dr. Rockey Situ stated we can order 1 mg BID and use [facility] through [facility] and bypass insurance or 2 mg daily with her current insurance. Pt prefers the 21m daily, reports she was on that in the past and it worked well with her. BP has been controlled since placed back on Cardura per pt. [facility] Pt verbalized understanding, will call back for anything further.

## 2020-12-02 ENCOUNTER — Other Ambulatory Visit: Payer: Self-pay | Admitting: Cardiovascular Disease

## 2020-12-09 ENCOUNTER — Other Ambulatory Visit: Payer: Self-pay | Admitting: Family Medicine

## 2020-12-29 ENCOUNTER — Other Ambulatory Visit: Payer: Self-pay | Admitting: Cardiovascular Disease

## 2021-01-06 ENCOUNTER — Other Ambulatory Visit: Payer: Self-pay | Admitting: *Deleted

## 2021-01-06 MED ORDER — SIMVASTATIN 40 MG PO TABS: ORAL_TABLET | ORAL | 3 refills | Status: DC

## 2021-01-11 ENCOUNTER — Other Ambulatory Visit: Payer: Self-pay | Admitting: Internal Medicine

## 2021-02-09 DIAGNOSIS — Z23 Encounter for immunization: Secondary | ICD-10-CM | POA: Diagnosis not present

## 2021-03-11 ENCOUNTER — Encounter: Payer: Self-pay | Admitting: Internal Medicine

## 2021-03-11 ENCOUNTER — Other Ambulatory Visit: Payer: Self-pay

## 2021-03-11 ENCOUNTER — Ambulatory Visit (INDEPENDENT_AMBULATORY_CARE_PROVIDER_SITE_OTHER): Payer: Medicare Other | Admitting: Internal Medicine

## 2021-03-11 ENCOUNTER — Other Ambulatory Visit: Payer: Self-pay | Admitting: Family Medicine

## 2021-03-11 VITALS — BP 140/82 | HR 67 | Ht 60.0 in | Wt 194.0 lb

## 2021-03-11 DIAGNOSIS — Z6835 Body mass index (BMI) 35.0-35.9, adult: Secondary | ICD-10-CM

## 2021-03-11 DIAGNOSIS — E782 Mixed hyperlipidemia: Secondary | ICD-10-CM | POA: Diagnosis not present

## 2021-03-11 DIAGNOSIS — E1165 Type 2 diabetes mellitus with hyperglycemia: Secondary | ICD-10-CM

## 2021-03-11 LAB — BASIC METABOLIC PANEL
BUN: 57 mg/dL — ABNORMAL HIGH (ref 6–23)
CO2: 22 mEq/L (ref 19–32)
Calcium: 9.4 mg/dL (ref 8.4–10.5)
Chloride: 109 mEq/L (ref 96–112)
Creatinine, Ser: 1.8 mg/dL — ABNORMAL HIGH (ref 0.40–1.20)
GFR: 27.86 mL/min — ABNORMAL LOW (ref >60.00)
Glucose, Bld: 144 mg/dL — ABNORMAL HIGH (ref 70–99)
Potassium: 5 mEq/L (ref 3.5–5.1)
Sodium: 139 mEq/L (ref 135–145)

## 2021-03-11 LAB — POCT GLYCOSYLATED HEMOGLOBIN (HGB A1C): Hemoglobin A1C: 7 % — AB (ref 4.0–5.6)

## 2021-03-11 MED ORDER — GLIPIZIDE ER 2.5 MG PO TB24: ORAL_TABLET | ORAL | 3 refills | Status: DC

## 2021-03-11 NOTE — Patient Instructions (Addendum)
Please continue: - Lantus 24 units after dinner - Glipizide XR 5 mg before dinner - Jardiance 10 mg before b'fast   Please return in 3-4 months with your sugar log.

## 2021-03-11 NOTE — Telephone Encounter (Signed)
Refill request for: Alprazolam 1 mg LR 12/10/20, #60, 2 rf LOV 07/18/20 FOV  None scheduled.      Please review and advise.  Thanks. Dm/cma

## 2021-03-11 NOTE — Progress Notes (Addendum)
Patient ID: Brenda [name], female   DOB: 1951/05/06, 70 y.o.   MRN: [id number]  This visit occurred during the SARS-CoV-2 public health emergency.  Safety protocols were in place, including screening questions prior to the visit, additional usage of staff PPE, and extensive cleaning of exam room while observing appropriate contact time as indicated for disinfecting solutions.   HPI: ARIANNI [name] is a 70 y.o.-year-old female, presenting for follow-up for DM2, dx in 2007, insulin-dependent, uncontrolled, with complications (CKD). Last visit 4 months ago.  Interim history: She had lows (50s) after starting Jardiance but not after stopping am Glipizide She still has increased urination. Drinks 64 oz water a day. No blurry vision, no nausea.  Reviewed HbA1c levels: Lab Results  Component Value Date   HGBA1C 7.6 (A) 11/11/2020   HGBA1C 6.4 (A) 07/08/2020   HGBA1C 7.7 (H) 05/15/2020   Patient is on: - Lantus 12 units in a.m.- started 05/2019 by PCP >> 24 units after dinner - Glipizide XR  2.5 mg in am and 5 mg before a large dinner >> 5 mg before dinner  - Farxiga 5 mg >> Jardiance 10 before breakfast-added 11/2020 She was on Glipizide in 11/2015 after a steroid inj. We stopped Victoza due to nausea. Metformin ER was stopped by PCP due to CKD. Glipizide ER was stopped 06/2019 due to low blood sugars.  Pt checks her sugars twice a day: - am:127-173, 180 >> 99-151, 166 >> 63, 89-200 >> 130-140, 160, 200 (after b'day) - 2h after b'fast: 156 >> n/c >> 148 >> n/c - before lunch: 85-147 >> 70-132, 155 >> 58, 64-164 >> 50s (resolved), 90s-120 - 2h after lunch: 118, 123 >> 92, 99 >> n/c - before dinner: 67, 81-158, 162 >> 80-143, 151 >> 62-160, 170 >> 120-140 - 2h after dinner:n/c >> 138, 148 >> n/c - bedtime: 91-143 >> n/c >> 107-128 >> 149-164 >> 130-140 - nighttime: n/c Lowest sugar was 37 x1 >> ...>> 58 >> 50s; she has hypoglycemia awareness in the 22s. Highest sugar was  166 (200s  before changing Glipizide IR >> ER) >> 200  Glucometer: Freestyle Lite  Pt's meals are: - Breakfast: English muffin + preserve or cereal or yoghurt - snack: fruit or fruit + yoghurt - Lunch: 1/2 sandwich + chips + salsa + fruit/sugar free >> protein bar - Dinner: soup or chicken/steak + veggies, occasional starch or Lean cuisine or light salad No soft drinks.  -+ CKD, last BUN/creatinine:  Lab Results  Component Value Date   BUN 41 (H) 11/12/2020   CREATININE 1.63 (H) 11/12/2020  On benazepril.  Sees nephrology. She had a high potassium (5.9) >> used Kayexalate x3 >> normalized.  She is on a low potassium diet. -+ HL last set of lipids: Lab Results  Component Value Date   CHOL 116 05/15/2020   HDL 21.90 (L) 05/15/2020   LDLCALC 50 05/30/2019   LDLDIRECT 55.0 05/15/2020   TRIG 207.0 (H) 05/15/2020   CHOLHDL 5 05/15/2020  On Zocor and Zetia. - last eye exam 2021: No DR; she had cataract surgery in 2016.  She has a history of retinal tear. - no numbness and tingling in her feet.  She takes Neurontin for leg cramps at night.  She also has CLL, HTN, GERD, depression.  She was admitted 11/2017 for dehydration, anemia, and acute renal failure >> diagnosed with CLL.  ROS: Constitutional: no weight gain/no weight loss, no fatigue, no subjective hyperthermia, no subjective hypothermia Eyes: no blurry  vision, no xerophthalmia ENT: no sore throat, no nodules palpated in neck, no dysphagia, no odynophagia, no hoarseness Cardiovascular: no CP/no SOB/no palpitations/+ leg swelling Respiratory: no cough/no SOB/no wheezing Gastrointestinal: no N/no V/no D/no C/no acid reflux Musculoskeletal: no muscle aches/no joint aches Skin: no rashes, no hair loss Neurological: no tremors/no numbness/no tingling/no dizziness  I reviewed pt's medications, allergies, PMH, social hx, family hx, and changes were documented in the history of present illness. Otherwise, unchanged from my initial visit  note.  Past Medical History:  Diagnosis Date  . Anxiety   . Arthritis   . CLL (chronic lymphocytic leukemia) (HCC)    Dx. 2019 Dr. Irene Limbo'  . Depression    Wellbutrin  . Diabetes mellitus    Type II  . Endometrial ca ([place])    1998  . 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 GERD (gastroesophageal reflux disease)   . Heart murmur    patient states "cardiologist has not heard heart murmur for past several years"  . Heel spur   . History of hiatal hernia    small  . 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 Hyperkalemia   . Hyperlipidemia   . Hypertension   . Left leg swelling    "swelling in left leg from knee down when sitting for prolonged periods or being on feet for a long period of time"  . PONV (postoperative nausea and vomiting)    after hysterectomy  . Renal insufficiency    Stage 3 b   Dr. Particia Nearing first visit 11-2019   Past Surgical History:  Procedure Laterality Date  . ABDOMINAL HYSTERECTOMY  1998  . COLONOSCOPY    . EYE SURGERY Bilateral    cataracts  . EYE SURGERY Left    retina tear  . FEMUR IM NAIL  10/25/2012   Procedure: INTRAMEDULLARY (IM) NAIL FEMORAL;  Surgeon: Mauri Pole, MD;  Location: [facility];  Service: Orthopedics;  Laterality: Left;  . HIP SURGERY     Fracture L IM nail and 3 rods  . left knee arthroscopy  12/2010  . LYMPH NODE BIOPSY     axillary left 12-2018  . REFRACTIVE SURGERY    . skin cancer removed right and and lower forearm     squamoun in situ  . TOTAL KNEE ARTHROPLASTY  12/20/2011   Procedure: TOTAL KNEE ARTHROPLASTY;  Surgeon: Gearlean Alf, MD;  Location: [facility];  Service: Orthopedics;  Laterality: Left;  Failed attempt at spinal   . TOTAL SHOULDER ARTHROPLASTY Right 08/19/2016   Procedure: RIGHT TOTAL SHOULDER ARTHROPLASTY;  Surgeon: Justice Britain, MD;  Location: [place];  Service: Orthopedics;  Laterality:  Right;  . TOTAL SHOULDER ARTHROPLASTY Left 01/10/2020   Procedure: TOTAL SHOULDER ARTHROPLASTY;  Surgeon: Justice Britain, MD;  Location: [facility];  Service: Orthopedics;  Laterality: Left;  185mn   Social History   Social History  . Marital Status: Single    Spouse Name: N/A  . Number of Children: 0   Occupational History  . RN at [employer]  . Smoking status: Never Smoker   . Smokeless tobacco: Never Used  . Alcohol Use: No  . Drug Use: No   Social History Narrative   RN at [employer]   Current Outpatient Medications on File Prior to Visit  Medication Sig Dispense Refill  . acetaminophen (TYLENOL) 500 MG tablet Take 500-1,000 mg by mouth every 6 (six) hours as needed for mild pain, moderate pain or fever.     . ALPRAZolam (XANAX) 1 MG tablet TAKE 1/2 TO 1 TABLET(0.5 TO 1 MG) BY MOUTH TWICE DAILY AS NEEDED FOR ANXIETY 60 tablet 2  . aspirin 81 MG tablet Take 81 mg by mouth every other day.     . BD PEN NEEDLE NANO 2ND GEN 32G X 4 MM MISC USE DAILY AS DIRECTED 100 each 3  . Blood Glucose Monitoring Suppl (ONETOUCH VERIO) w/Device KIT Use as advised 1 kit 0  . buPROPion (WELLBUTRIN XL) 300 MG 24 hr tablet Take 1 tablet (300 mg total) by mouth daily. 90 tablet 3  . calcium-vitamin D (OSCAL WITH D) 500-200 MG-UNIT tablet Take 1 tablet by mouth. With vtamin E    . Cholecalciferol (VITAMIN D3) 2000 units TABS Take 2,000 mcg by mouth daily.    . Cyanocobalamin (B-12) 1000 MCG SUBL Place 1,000 mcg under the tongue every evening.     Marland Kitchen doxazosin (CARDURA) 2 MG tablet Take 1 tablet (2 mg total) by mouth daily. 90 tablet 3  . empagliflozin (JARDIANCE) 10 MG TABS tablet Take 1 tablet (10 mg total) by mouth daily before breakfast. 30 tablet 11  . esomeprazole (NEXIUM) 20 MG capsule Take 20 mg by mouth daily as needed (acid reflux symptoms).     . ezetimibe (ZETIA) 10 MG tablet TAKE 1 TABLET(10 MG) BY MOUTH DAILY 90 tablet 3  . glipiZIDE (GLUCOTROL XL) 2.5 MG  24 hr tablet Take 1 tablet in the AM and 2 tablets in the evening    . glucose blood (ONETOUCH VERIO) test strip USE TO CHECK BLOOD SUGAR THREE TIMES DAILY 300 strip 1  . iron polysaccharides (NIFEREX) 150 MG capsule Take 150 mg by mouth at bedtime.     Elmore Guise Devices (LANCING DEVICE) MISC Use as advised - Freestyle 1 each 0  . LANTUS SOLOSTAR 100 UNIT/ML Solostar Pen ADMINISTER 20 UNITS UNDER THE SKIN AT BEDTIME 18 mL 3  . lisinopril (ZESTRIL) 20 MG tablet Take 1 tablet (20 mg total) by mouth daily. 90 tablet 3  . metoprolol succinate (TOPROL-XL) 50 MG 24 hr tablet Take 0.5 tablets (25 mg total) by mouth daily. Take with or immediately following a meal. 45 tablet 3  . NONFORMULARY OR COMPOUNDED ITEM Apply 1 application topically 2 (two) times daily as needed (sun damage on face). FLUOROURACIL 5% + CALCIPOTRIENE 0.005%    . ondansetron (ZOFRAN) 4 MG tablet Take 1 tablet (4 mg total) by mouth every 8 (eight) hours as needed for nausea or vomiting. 20 tablet 0  . simvastatin (ZOCOR) 40 MG tablet TAKE 1 TABLET(40 MG) BY MOUTH AT BEDTIME 90 tablet 3  . triamcinolone cream (KENALOG) 0.1 % Apply 1 application topically 2 (two) times daily as needed (skin rash.).      No current facility-administered medications on file prior to visit.   Allergies  Allergen Reactions  . Amlodipine     Swelling    . Nsaids     Due to kidney function   Family History  Problem Relation Age of Onset  . Cancer Mother        breast cancer  . Cancer Father        plasma sarcoma  . Breast cancer Neg Hx    PE: BP 140/82 (BP Location: Right Arm, Patient Position: Sitting, Cuff Size:  Normal)   Pulse 67   Ht 5' (1.524 m)   Wt 194 lb (88 kg)   SpO2 96%   BMI 37.89 kg/m  Body mass index is 37.89 kg/m. Wt Readings from Last 3 Encounters:  03/11/21 194 lb (88 kg)  11/12/20 185 lb (83.9 kg)  11/11/20 185 lb (83.9 kg)   Constitutional: overweight, in NAD Eyes: PERRLA, EOMI, no exophthalmos ENT: moist mucous  membranes, no thyromegaly, no cervical lymphadenopathy Cardiovascular: RRR, No MRG, + LLE edema-chronic Respiratory: CTA B Gastrointestinal: abdomen soft, NT, ND, BS+ Musculoskeletal: no deformities, strength intact in all 4 Skin: moist, warm, no rashes Neurological: no tremor with outstretched hands, DTR normal in all 4  ASSESSMENT: 1. DM2, insulin-dependent, uncontrolled, with complications  - mild CKD  2. Obesity class 2  3. HL  PLAN:  1. Patient with longstanding, uncontrolled, type 2 diabetes, with much improved control since the summer 2020, after addition of insulin.  At that time, metformin was stopped due to worsening kidney function.  She was on Victoza in the past but stopped due to nausea.  She was reticent to try another GLP-1 receptor agonist afterwards.  At last visit, sugars were fluctuating with more hyperglycemic spikes along with occasional low blood sugars.  We discussed about adding an SGLT2 inhibitor after discussion about benefits and possible side effects.  I advised her to stop the a.m. dose of glipizide after starting Farxiga.  HbA1c at that time was 7.6%, higher -At today's visit, she tells me that she was able to start Jardiance, but Wilder Glade was not covered.  Sugars decreased to the 50s before lunch after she started the medications so she stopped the a.m. glipizide.  After this, sugars improved without further lows.  In the morning, she is mostly at or slightly above target, but sugars later in the day are at goal.  For now, to avoid further hypoglycemia, I advised her to continue on the same regimen.  At next visit, we may need to increase the Jardiance to 25 mg daily.  She would like to avoid this for now, since she does have increased urination.  She did not notice that this has worsened after starting Jardiance, though. - I suggested to:  Patient Instructions  Please continue: - Lantus 24 units after dinner - Glipizide XR 5 mg before dinner - Jardiance 10 mg  before b'fast   Please return in 3-4 months with your sugar log.  - we checked her HbA1c: 7.0% (better) - advised to check sugars at different times of the day - 1x a day, rotating check times - advised for yearly eye exams >> she is UTD (she mentions the previous exam was in 03/2020) and she has another exam scheduled. - will check a BMP today to verify her potassium and kidney function. - return to clinic in 3-4 months     2. Obesity class 2 -She has problems eating the right foods, states she also has to limit potassium -We started [place] at last visit which should also help with weight loss -she gained 9 lbs since last OV  3.  Hyperlipidemia -Reviewed latest lipid panel from 05/2020: LDL at goal, triglycerides high, HDL low: Lab Results  Component Value Date   CHOL 116 05/15/2020   HDL 21.90 (L) 05/15/2020   LDLCALC 50 05/30/2019   LDLDIRECT 55.0 05/15/2020   TRIG 207.0 (H) 05/15/2020   CHOLHDL 5 05/15/2020  -Continue Zocor 40 mg daily and Zetia 10 mg daily without side effects  Component     Latest Ref Rng & Units 03/11/2021          Sodium     135 - 145 mEq/L 139  Potassium     3.5 - 5.1 mEq/L 5.0  Chloride     96 - 112 mEq/L 109  CO2     19 - 32 mEq/L 22  Glucose     70 - 99 mg/dL 144 (H)  BUN     6 - 23 mg/dL 57 (H)  Creatinine     0.40 - 1.20 mg/dL 1.80 (H)  Calcium     8.4 - 10.5 mg/dL 9.4  GFR     >60.00 mL/min 27.86 (L)  Hemoglobin A1C     4.0 - 5.6 % 7.0 (A)  Kidney function is worse, but creatinine can increase early in the treatment with SGLT2 inhibitor.  For now, I will advise her to stay hydrated, but would not necessarily recommend to stop Jardiance.  However, I will send the records to Dr. Hollie Salk, patient nephrologist to let her know.   Philemon Kingdom, MD PhD [facility] Endocrinology

## 2021-03-12 ENCOUNTER — Ambulatory Visit (INDEPENDENT_AMBULATORY_CARE_PROVIDER_SITE_OTHER): Payer: Medicare Other | Admitting: Cardiovascular Disease

## 2021-03-12 ENCOUNTER — Telehealth: Payer: Self-pay | Admitting: Cardiovascular Disease

## 2021-03-12 ENCOUNTER — Encounter: Payer: Self-pay | Admitting: Cardiovascular Disease

## 2021-03-12 VITALS — BP 152/70 | HR 114 | Ht 60.0 in | Wt 189.5 lb

## 2021-03-12 DIAGNOSIS — E782 Mixed hyperlipidemia: Secondary | ICD-10-CM | POA: Diagnosis not present

## 2021-03-12 DIAGNOSIS — I10 Essential (primary) hypertension: Secondary | ICD-10-CM | POA: Diagnosis not present

## 2021-03-12 DIAGNOSIS — I480 Paroxysmal atrial fibrillation: Secondary | ICD-10-CM | POA: Diagnosis not present

## 2021-03-12 DIAGNOSIS — I25118 Atherosclerotic heart disease of native coronary artery with other forms of angina pectoris: Secondary | ICD-10-CM | POA: Diagnosis not present

## 2021-03-12 DIAGNOSIS — E1165 Type 2 diabetes mellitus with hyperglycemia: Secondary | ICD-10-CM | POA: Diagnosis not present

## 2021-03-12 MED ORDER — ELIQUIS 5 MG PO TABS: 5.0000 mg | ORAL_TABLET | Freq: Two times a day (BID) | ORAL | 11 refills | Status: DC

## 2021-03-12 MED ORDER — METOPROLOL SUCCINATE ER 50 MG PO TB24: 50.0000 mg | ORAL_TABLET | Freq: Two times a day (BID) | ORAL | 3 refills | Status: DC | PRN

## 2021-03-12 NOTE — Patient Instructions (Addendum)
Medication Instructions:  Stop  Aspirin Cardura/doxazosin  Start  eliquis 5 mg twice a day, blood thinner  Coupon given, register savings card  Free samples given (2 boxes)  Lot: [id number]  Exp: 03/2023  Take metoprolol succinate 50 mg twice a day  Monitor pulse at home  Once back to rate <70,   decrease the metoprolol succinate back 50 mg daily  If heart rate does not improve by Saturday, increase the metoprolol up to 75 mg twice a day    If you need a refill on your cardiac medications before your next appointment, please call your pharmacy.    Lab work: No new labs needed  Testing/Procedures: No new testing needed   Follow-Up:  . You will need a follow up appointment in 1 month  . Providers on your designated Care Team:   . Murray Hodgkins, NP . Christell Faith, PA-C . Marrianne Mood, PA-C   COVID-19 Vaccine Information can be found at: [email or web address].co.uk For questions related to vaccine distribution or appointments, please email vaccine@[email or web address] .com or call [phone].

## 2021-03-12 NOTE — Telephone Encounter (Signed)
STAT if HR is under 50 or over 120 (normal HR is 60-100 beats per minute)  1) What is your heart rate? Has not been under 110 today - last reading 127  2) Do you have a log of your heart rate readings (document readings)?   3) Do you have any other symptoms? No pain, BP normal

## 2021-03-12 NOTE — Telephone Encounter (Signed)
Call transferred directly to this RN from scheduling team.   I spoke with the patient. She reports she woke up this morning and was taking her routine BP/ HR like she typically does. She reports that her HR was 111 bpm at the time. HR's are typically in the 60's for her.   She took her metoprolol succinate 25 mg once daily dose. She has continued to monitor her HR's which have been from 120-127 bpm after taking her metoprolol.  BP has been from 108-128/ 70-89.  Her lowest HR since getting up this morning was 92 bpm.  The patient advises she is asymptomatic. She has manually tried to feel her pulse and this does feel like it is "skipping" some beats.  I have advised the patient we should bring her in for an appointment to reassess her EKG and confirm her rhythm. She is agreeable with seeing Dr. Rockey Situ at 3:00 pm today.  I have advised her to bring her metoprolol succinate with her in case Dr. Rockey Situ wants her to go ahead and take some more while she is here.  The patient voices understanding of the above recommendations and is agreeable.   The patient does have a history of tachycardia, which she says has been controlled on metoprolol. She does not have a history of atrial fibrillation.  She does also have a history of anemia, but her last hemoglobin was 12.8 (11/12/20).

## 2021-03-12 NOTE — Progress Notes (Signed)
Patient ID: Brenda [name], female   DOB: 09-28-1951, 70 y.o.   MRN: [id number] Cardiology Office Note  Date:  03/12/2021   ID:  Brenda [name], Brenda [name] [birth year], MRN [id number]  PCP:  Brenda Nian, DO   Chief Complaint  Patient presents with  . OTHER    Tachycardia/Sob. Meds reviewed verbally with pt.    HPI:  Brenda [name] is a  69 -year-old woman with history of coronary artery disease, CT coronary calcium score 1031 obesity, diabetes, poorly controlled, hemoglobin A1c previously of 12 , Now 6 hyperlipidemia,  with previous stress test and echocardiogram for abnormal EKG,  CLL s/p shoulder replacement surgery who presents for routine followup of her coronary artery disease and hyperlipidemia.  Reports waking this morning, noted some mild shortness of breath, checked her vitals and had elevated heart rate Typically heart rate 160 bpm, this morning variable 100 up to 120 bpm  New atrial fibrillation today Sx of mild SOB, Denies any leg swelling, PND orthopnea  Labs reviewed CR 1.8 yesterday, trending higher A1C 7.0  EKG personally reviewed by myself on todays visit Atrial fibrillation rate 114 bpm no ST or T wave changes  Other past medical hx reviewed In hospital 06/2020,   fall, fractured ribs, dehydration, acute renal failure  weakness and dizziness  acute kidney injury and has a serum creatinine of 4.63 compared to baseline of 1.67. Fever of 101. So far cultures are negative. Stool for C. difficile negative. Patient on Rocephin and Flagyl Treated with midodrine  follow-up with Brenda [name] from [place] [facility]  PT recommended home with home services 24-hour supervision Was very SOB for weeks after she got home   hospital 11/2017 Dehydrated Lung nodules, enlarged lymph nodes Anemic  HBG 6.6 after hydration, iron def, stool negative BP was low. 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  PSH:    Past Surgical History:  Procedure Laterality Date  . ABDOMINAL HYSTERECTOMY  1998  . COLONOSCOPY    . EYE SURGERY Bilateral    cataracts  . EYE SURGERY Left    retina tear  . FEMUR IM NAIL  10/25/2012   Procedure: INTRAMEDULLARY (IM) NAIL FEMORAL;  Surgeon: Brenda Pole, MD;  [facility];  Service: Orthopedics;  Laterality: Left;  . HIP SURGERY     Fracture L IM nail and 3 rods  . left knee arthroscopy  12/2010  . LYMPH NODE BIOPSY     axillary left 12-2018  . REFRACTIVE SURGERY    . skin cancer removed right and and lower forearm     squamoun in situ  . TOTAL KNEE ARTHROPLASTY  12/20/2011   Procedure: TOTAL KNEE ARTHROPLASTY;  Surgeon: Brenda Alf, MD;  [facility];  Service: Orthopedics;  Laterality: Left;  Failed attempt at spinal   . TOTAL SHOULDER ARTHROPLASTY Right 08/19/2016   Procedure: RIGHT TOTAL SHOULDER ARTHROPLASTY;  Surgeon: Brenda Britain, MD;  Location: [place];  Service: Orthopedics;  Laterality: Right;  . TOTAL SHOULDER ARTHROPLASTY Left 01/10/2020   Procedure: TOTAL SHOULDER ARTHROPLASTY;  Surgeon:  Brenda Britain, MD;  [facility];  Service: Orthopedics;  Laterality: Left;  122mn    Current Outpatient Medications  Medication Sig Dispense Refill  . acetaminophen (TYLENOL) 500 MG tablet Take 500-1,000 mg by mouth every 6 (six) hours as needed for mild pain, moderate pain or fever.     . ALPRAZolam (XANAX) 1 MG tablet TAKE 1/2 TO 1 TABLET(0.5 TO 1 MG) BY MOUTH TWICE DAILY AS  NEEDED FOR ANXIETY 60 tablet 2  . aspirin 81 MG tablet Take 81 mg by mouth every other day.     . BD PEN NEEDLE NANO 2ND GEN 32G X 4 MM MISC USE DAILY AS DIRECTED 100 each 3  . Blood Glucose Monitoring Suppl (ONETOUCH VERIO) w/Device KIT Use as advised 1 kit 0  . buPROPion (WELLBUTRIN XL) 300 MG 24 hr tablet Take 1 tablet (300 mg total) by mouth daily. 90 tablet 3  . calcium-vitamin D (OSCAL WITH D) 500-200 MG-UNIT tablet Take 1 tablet by mouth. With vtamin E    . Cholecalciferol (VITAMIN D3) 2000 units TABS Take 2,000 mcg by mouth daily.    . Cyanocobalamin (B-12) 1000 MCG SUBL Place 1,000 mcg under the tongue every evening.     .Marland Kitchendoxazosin (CARDURA) 2 MG tablet Take 1 tablet (2 mg total) by mouth daily. 90 tablet 3  . empagliflozin (JARDIANCE) 10 MG TABS tablet Take 1 tablet (10 mg total) by mouth daily before breakfast. 30 tablet 11  . esomeprazole (NEXIUM) 20 MG capsule Take 20 mg by mouth daily as needed (acid reflux symptoms).     . ezetimibe (ZETIA) 10 MG tablet TAKE 1 TABLET(10 MG) BY MOUTH DAILY 90 tablet 3  . glipiZIDE (GLUCOTROL XL) 2.5 MG 24 hr tablet Take 2 tablets in the evening 180 tablet 3  . glucose blood (ONETOUCH VERIO) test strip USE TO CHECK BLOOD SUGAR THREE TIMES DAILY 300 strip 1  . insulin glargine (LANTUS SOLOSTAR) 100 UNIT/ML Solostar Pen Inject 24 Units into the skin daily.    . iron polysaccharides (NIFEREX) 150 MG capsule Take 150 mg by mouth at bedtime.     .Elmore GuiseDevices (LANCING DEVICE) MISC Use as advised - Freestyle 1 each 0  . lisinopril (ZESTRIL) 20 MG tablet Take 1 tablet (20 mg total) by mouth daily. 90 tablet 3  . metoprolol succinate (TOPROL-XL) 50 MG 24 hr tablet Take 0.5 tablets (25 mg total) by mouth daily. Take with or immediately following a meal. 45 tablet 3  . NONFORMULARY OR COMPOUNDED ITEM Apply 1 application topically 2 (two) times daily as needed (sun damage on face). FLUOROURACIL 5% + CALCIPOTRIENE 0.005%    . ondansetron (ZOFRAN) 4 MG tablet  Take 1 tablet (4 mg total) by mouth every 8 (eight) hours as needed for nausea or vomiting. 20 tablet 0  . simvastatin (ZOCOR) 40 MG tablet TAKE 1 TABLET(40 MG) BY MOUTH AT BEDTIME 90 tablet 3  . triamcinolone cream (KENALOG) 0.1 % Apply 1 application topically 2 (two) times daily as needed (skin rash.).      No current facility-administered medications for this visit.    Allergies:   Amlodipine and Nsaids   Social History:  The patient  reports that she has never smoked. She has never used smokeless tobacco. She reports that she does not drink alcohol and does not use drugs.   Family History:   family history includes Cancer in her father and mother.    Review of Systems: Review of Systems  Constitutional:  Negative.   HENT: Negative.   Respiratory: Positive for shortness of breath.   Cardiovascular: Negative.   Gastrointestinal: Negative.   Musculoskeletal: Negative.   Neurological: Negative.   Psychiatric/Behavioral: Negative.  Suicidal ideas:   All other systems reviewed and are negative.    PHYSICAL EXAM: VS:  BP (!) 152/70 (BP Location: Left Arm, Patient Position: Sitting, Cuff Size: Normal)   Pulse (!) 114   Ht 5' (1.524 m)   Wt 189 lb 8 oz (86 kg)   SpO2 97%   BMI 37.01 kg/m  , BMI Body mass index is 37.01 kg/m. Constitutional:  oriented to person, place, and time. No distress.  HENT:  Head: Grossly normal Eyes:  no discharge. No scleral icterus.  Neck: No JVD, no carotid bruits  Cardiovascular: irreg irreg, no murmurs appreciated Pulmonary/Chest: Clear to auscultation bilaterally, no wheezes or rails Abdominal: Soft.  no distension.  no tenderness.  Musculoskeletal: Normal range of motion Neurological:  normal muscle tone. Coordination normal. No atrophy Skin: Skin warm and dry Psychiatric: normal affect, pleasant  Recent Labs: 11/12/2020: ALT 14; Hemoglobin 12.8; Platelets 170 03/11/2021: BUN 57; Creatinine, Ser 1.80; Potassium 5.0; Sodium 139    Lipid  Panel Lab Results  Component Value Date   CHOL 116 05/15/2020   HDL 21.90 (L) 05/15/2020   LDLCALC 50 05/30/2019   TRIG 207.0 (H) 05/15/2020      Wt Readings from Last 3 Encounters:  03/12/21 189 lb 8 oz (86 kg)  03/11/21 194 lb (88 kg)  11/12/20 185 lb (83.9 kg)     ASSESSMENT AND PLAN:  Essential hypertension -  Blood pressure is well controlled on today's visit. No changes made to the medications.  Atrial fibrillation with RVR Start eliquis 5 BID Stop asa Take metoprolol succinate 50 mg twice a day  Monitor pulse at home  Once back to rate <70,   decrease the metoprolol succinate back 50 mg daily If heart rate does not improve in 2 days, increase the metoprolol up to 75 mg twice a day  Recommend she call our office in next several days if NSR not restored  Type 2 diabetes mellitus with hyperglycemia, without long-term current use of insulin (HCC) Much better control in the past 2 years Recommend she continue aggressive diet restriction walking program  morbid obesity We have encouraged continued exercise, careful diet management in an effort to lose weight.  HLD (hyperlipidemia) Tolerating simvastatin, zetia Cholesterol is at goal on the current lipid regimen. No changes to the medications were made.  Anemia History of iron deficiency, followed by primary care Hemoglobin stable  Chronic renal failure Followed by nephrology CR high end of range  Long discussion concerning new finding of atrial fibrillation, medications for management, discussed triggers such as weight, sleep apnea  Total encounter time more than 35 minutes  Greater than 50% was spent in counseling and coordination of care with the patient    No orders of the defined types were placed in this encounter.  Signed, Esmond Plants, M.D., Ph.D. 03/12/2021  [place], [place]

## 2021-03-13 ENCOUNTER — Other Ambulatory Visit: Payer: Self-pay | Admitting: *Deleted

## 2021-03-13 MED ORDER — METOPROLOL SUCCINATE ER 50 MG PO TB24: ORAL_TABLET | ORAL | 0 refills | Status: DC

## 2021-03-24 DIAGNOSIS — H3500 Unspecified background retinopathy: Secondary | ICD-10-CM | POA: Diagnosis not present

## 2021-03-24 DIAGNOSIS — Z961 Presence of intraocular lens: Secondary | ICD-10-CM | POA: Diagnosis not present

## 2021-03-24 DIAGNOSIS — E119 Type 2 diabetes mellitus without complications: Secondary | ICD-10-CM | POA: Diagnosis not present

## 2021-03-24 LAB — HM DIABETES EYE EXAM

## 2021-04-13 NOTE — Progress Notes (Signed)
Cardiology Office Note:    Date:  04/14/2021   ID:  Brenda, [name] 19-Aug-1951, MRN [id number]  PCP:  Brenda Nian, DO  [facility] Cardiologist:  Brenda Rogue, MD  [place] Electrophysiologist:  None   Referring MD: Brenda Nian, DO   Chief Complaint: 1 month follow-up  History of Present Illness:    Brenda [name] is a 70 y.o. female with a hx of CAD, iron deficiency anemia, CKD, CT calcium score 1031, obesity, poorly controlled DM2, CLL, s/p shoulder replacement surgery who presents for follow-up.   Seen 03/12/21 and reported elevated heart rate. She was diagnosed with new afib. Eliquis 76m BID was started and metoprolol 571mBID. ASA was stopped.   Today, the patient is is normal rhythm She is asymptomatic in afib, she knows she is in afib when her heart rate is high. She had been taking Toprol 5087mID. Cardura previously stopped to allow for titration of BB. However, noted high Bps in the last few weeks so she re-started Cardura 2mg73mily for 1-2 weeks ago. Bps currently well controlled, with heart rates in 50-60s. She denies lightheadedness or dizziness. She had a recent URI with severe coughing, still has some back pain. Taing Tylenol . No recent fever chills, nausea, vomiting, chest pain. Needs CBC, but refuses today, saying she is getting labs next month with hematology. She is okay with checking TSH and Echo, also has murmur on exam.   Past Medical History:  Diagnosis Date   Anxiety    Arthritis    CLL (chronic lymphocytic leukemia) (HCC)[place] Dx. 2019 Dr. KaleIrene LimboDepression    Wellbutrin   Diabetes mellitus    Type II   [facility]   Foot fracture, left    "non-union fracture that happened 30-40 years ago"   GERD (gastroesophageal reflux disease)    Heart murmur    patient states "cardiologist has not heard heart murmur for past several years"   Heel spur    History of hiatal hernia    small   History of squamous  cell carcinoma in situ 02/19/2019   [facility] - Pathology from right hand dorsal mass excision on 4.8.20   Hyperkalemia    Hyperlipidemia    Hypertension    Left leg swelling    "swelling in left leg from knee down when sitting for prolonged periods or being on feet for a long period of time"   PONV (postoperative nausea and vomiting)    after hysterectomy   Renal insufficiency    Stage 3 b   Dr. JeweParticia Nearingst visit 11-2019    Past Surgical History:  Procedure Laterality Date   ABDOMINAL HYSTERECTOMY  1998   COLONOSCOPY     EYE SURGERY Bilateral    cataracts   EYE SURGERY Left    retina tear   FEMUR IM NAIL  10/25/2012   Procedure: INTRAMEDULLARY (IM) NAIL FEMORAL;  Surgeon: MattMauri Pole;  [facility];  Service: Orthopedics;  Laterality: Left;   HIP SURGERY     Fracture L IM nail and 3 rods   left knee arthroscopy  12/2010   LYMPH NODE BIOPSY     axillary left 12-2018   REFRACTIVE SURGERY     skin cancer removed right and and lower forearm     squamoun in situ   TOTAL KNEE ARTHROPLASTY  12/20/2011   Procedure: TOTAL KNEE ARTHROPLASTY;  Surgeon:  Gearlean Alf, MD;  [facility];  Service: Orthopedics;  Laterality: Left;  Failed attempt at spinal    TOTAL SHOULDER ARTHROPLASTY Right 08/19/2016   Procedure: RIGHT TOTAL SHOULDER ARTHROPLASTY;  Surgeon: Justice Britain, MD;  Location: [place];  Service: Orthopedics;  Laterality: Right;   TOTAL SHOULDER ARTHROPLASTY Left 01/10/2020   Procedure: TOTAL SHOULDER ARTHROPLASTY;  Surgeon: Justice Britain, MD;  [facility];  Service: Orthopedics;  Laterality: Left;  174mn    Current Medications: Current Meds  Medication Sig   acetaminophen (TYLENOL) 500 MG tablet Take 500-1,000 mg by mouth every 6 (six) hours as needed for mild pain, moderate pain or fever.    ALPRAZolam (XANAX) 1 MG tablet TAKE 1/2 TO 1 TABLET(0.5 TO 1 MG) BY MOUTH TWICE DAILY AS NEEDED FOR ANXIETY   apixaban (ELIQUIS) 5 MG TABS tablet Take 1 tablet (5 mg  total) by mouth 2 (two) times daily.   BD PEN NEEDLE NANO 2ND GEN 32G X 4 MM MISC USE DAILY AS DIRECTED   Blood Glucose Monitoring Suppl (ONETOUCH VERIO) w/Device KIT Use as advised   buPROPion (WELLBUTRIN XL) 300 MG 24 hr tablet Take 1 tablet (300 mg total) by mouth daily.   calcium-vitamin D (OSCAL WITH D) 500-200 MG-UNIT tablet Take 1 tablet by mouth. With vtamin E   Cholecalciferol (VITAMIN D3) 2000 units TABS Take 2,000 mcg by mouth daily.   Cyanocobalamin (B-12) 1000 MCG SUBL Place 1,000 mcg under the tongue every evening.    doxazosin (CARDURA) 2 MG tablet Take 2 mg by mouth daily.   empagliflozin (JARDIANCE) 10 MG TABS tablet Take 1 tablet (10 mg total) by mouth daily before breakfast.   esomeprazole (NEXIUM) 20 MG capsule Take 20 mg by mouth daily as needed (acid reflux symptoms).    ezetimibe (ZETIA) 10 MG tablet TAKE 1 TABLET(10 MG) BY MOUTH DAILY   glipiZIDE (GLUCOTROL XL) 2.5 MG 24 hr tablet Take 2 tablets in the evening   glucose blood (ONETOUCH VERIO) test strip USE TO CHECK BLOOD SUGAR THREE TIMES DAILY   insulin glargine (LANTUS SOLOSTAR) 100 UNIT/ML Solostar Pen Inject 24 Units into the skin daily.   iron polysaccharides (NIFEREX) 150 MG capsule Take 150 mg by mouth at bedtime.    Lancet Devices (LANCING DEVICE) MISC Use as advised - Freestyle   lisinopril (ZESTRIL) 20 MG tablet Take 1 tablet (20 mg total) by mouth daily.   metoprolol succinate (TOPROL-XL) 50 MG 24 hr tablet Take 1 tablet (50 mg) by mouth twice daily as directed   NONFORMULARY OR COMPOUNDED ITEM Apply 1 application topically 2 (two) times daily as needed (sun damage on face). FLUOROURACIL 5% + CALCIPOTRIENE 0.005%   ondansetron (ZOFRAN) 4 MG tablet Take 1 tablet (4 mg total) by mouth every 8 (eight) hours as needed for nausea or vomiting.   simvastatin (ZOCOR) 40 MG tablet TAKE 1 TABLET(40 MG) BY MOUTH AT BEDTIME   triamcinolone cream (KENALOG) 0.1 % Apply 1 application topically 2 (two) times daily as needed  (skin rash.).      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[facility] [employer]    Employer: [employer] Tobacco Use  Smoking status: Never   Smokeless tobacco: Never  Vaping Use   Vaping Use: Never used  Substance and Sexual Activity   Alcohol use: No   Drug use: No   Sexual activity: Not Currently  Other Topics Concern   Not on file  Social History Narrative   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            Social Determinants of Health   Financial Resource Strain: Low Risk    Difficulty of Paying Living Expenses: Not hard at all  Food Insecurity: No Food Insecurity   Worried About Charity fundraiser in the Last Year: Never true   [place] in the Last Year: Never true  Transportation Needs: No Transportation Needs   Lack of Transportation (Medical): No   Lack of Transportation (Non-Medical): No  Physical Activity: Inactive   Days of Exercise per Week: 0 days   Minutes of Exercise per Session: 0 min  Stress: No Stress Concern Present   Feeling of Stress : Not at all  Social Connections: Moderately Isolated   Frequency of Communication with Friends and Family: More than three times a week   Frequency of Social Gatherings with Friends and Family: More than three times a week   Attends Religious Services: More than 4 times per year   Active Member of [employer] or Organizations: No   Attends Archivist Meetings: Never   Marital Status: Never married     Family History: The patient's family history includes Cancer in her father and mother. There is no history of Breast cancer.  ROS:   Please see the history of present illness.     All other systems reviewed and are negative.  EKGs/Labs/Other Studies Reviewed:      N/A  EKG:  EKG is  ordered today.  The ekg ordered today demonstrates NSR, 65bpm, LAD, nonspecific T wave changes.   Recent Labs: 11/12/2020: ALT 14; Hemoglobin 12.8; Platelets 170 03/11/2021: BUN 57; Creatinine, Ser 1.80; Potassium 5.0; Sodium 139  Recent Lipid Panel    Component Value Date/Time   CHOL 116 05/15/2020 0827   CHOL 171 12/28/2013 1054   TRIG 207.0 (H) 05/15/2020 0827   HDL 21.90 (L) 05/15/2020 0827   HDL 36 (L) 12/28/2013 1054   CHOLHDL 5 05/15/2020 0827   VLDL 41.4 (H) 05/15/2020 0827   LDLCALC 50 05/30/2019 0820   LDLCALC 100 (H) 12/28/2013 1054   LDLDIRECT 55.0 05/15/2020 0827      Physical Exam:    VS:  BP 130/68 (BP Location: Left Arm, Patient Position: Sitting, Cuff Size: Normal)   Pulse 65   Ht 5' (1.524 m)   Wt 184 lb 8 oz (83.7 kg)   SpO2 98%   BMI 36.03 kg/m     Wt Readings from Last 3 Encounters:  04/14/21 184 lb 8 oz (83.7 kg)  03/12/21 189 lb 8 oz (86 kg)  03/11/21 194 lb (88 kg)     GEN: Well nourished, well developed in no acute distress HEENT: Normal NECK: No JVD; No carotid bruits LYMPHATICS: No lymphadenopathy CARDIAC: RRR, +murmurs, no rubs, gallops RESPIRATORY:  Clear to auscultation without rales, wheezing or rhonchi  ABDOMEN: Soft, non-tender, non-distended MUSCULOSKELETAL:  chronic left foot edema; No deformity  SKIN: Warm and dry NEUROLOGIC:  Alert and oriented x 3 PSYCHIATRIC:  Normal affect   ASSESSMENT:    1. Atrial fibrillation, unspecified type ([place])   2. Medication management   3. Paroxysmal atrial fibrillation (  [place])   4. Mixed hyperlipidemia   5. Coronary artery disease of native artery of native heart with stable angina pectoris ([place])   6. Murmur    PLAN:    In order of problems listed above:  Recently diagnosed paroxysmal afib She is in NSR today. Generally asymptomatic in afib. She is taking Toprol 38m BID for rate control. Previously tried to go down to 50 , but went back into afib. Heart rates  50-60, no dizziness or lightheadedness. Continue current BB dose. Patient is on Eliquis 514mBID. Would check a CBC today, however patient is reluctant as that hematology will check labs in 1 month. She is okay to check TSH. I will let Dr. GoRockey Situnow. I will also update an echo, also murmur noted on exam.   Morbid obesity Lifestyle changes encouraged.   HLD LDL 55 05/2020. Continue simvastatin and Zetia  Iron deficiency Anemia Follows with hematology. Would repeat CBC given initiation of Eliquis, however by patient request, we will wait until 6/14 when she sees hematology.   CKD Most recent labs with increase kidney fucntion, Cr 1.8/BUN 57 since starting Jardiance by Endocrinologist. Again, patient refused updated labs.   Murmur Echo as above.   Disposition: Follow up in 3 month(s) with MD     Signed, Alper Guilmette H Ninfa MeekerPA-C  04/14/2021 3:31 PM    [facility]

## 2021-04-14 ENCOUNTER — Ambulatory Visit (INDEPENDENT_AMBULATORY_CARE_PROVIDER_SITE_OTHER): Payer: Medicare Other | Admitting: Medical

## 2021-04-14 ENCOUNTER — Encounter: Payer: Self-pay | Admitting: Medical

## 2021-04-14 ENCOUNTER — Other Ambulatory Visit: Payer: Self-pay

## 2021-04-14 VITALS — BP 130/68 | HR 65 | Ht 60.0 in | Wt 184.5 lb

## 2021-04-14 DIAGNOSIS — Z79899 Other long term (current) drug therapy: Secondary | ICD-10-CM

## 2021-04-14 DIAGNOSIS — R011 Cardiac murmur, unspecified: Secondary | ICD-10-CM | POA: Diagnosis not present

## 2021-04-14 DIAGNOSIS — I4891 Unspecified atrial fibrillation: Secondary | ICD-10-CM

## 2021-04-14 DIAGNOSIS — I480 Paroxysmal atrial fibrillation: Secondary | ICD-10-CM | POA: Diagnosis not present

## 2021-04-14 DIAGNOSIS — I25118 Atherosclerotic heart disease of native coronary artery with other forms of angina pectoris: Secondary | ICD-10-CM | POA: Diagnosis not present

## 2021-04-14 DIAGNOSIS — E782 Mixed hyperlipidemia: Secondary | ICD-10-CM | POA: Diagnosis not present

## 2021-04-14 NOTE — Patient Instructions (Signed)
Medication Instructions:  Please continue your current medications  *If you need a refill on your cardiac medications before your next appointment, please call your pharmacy*   Lab Work: TSH LABS WILL APPEAR ON MYCHART, ABNORMAL RESULTS WILL BE CALLED  Testing/Procedures: ECHO  Your physician has requested that you have an echocardiogram. Echocardiography is a painless test that uses sound waves to create images of your heart. It provides your doctor with information about the size and shape of your heart and how well your heart's chambers and valves are working. This procedure takes approximately one hour. There are no restrictions for this procedure.  There is a possibility that an IV may need to be started during your test to inject an image enhancing agent. This is done to obtain more optimal pictures of your heart. Therefore we ask that you do at least drink some water prior to coming in to hydrate your veins.     Follow-Up:  Your next appointment:   3 month(s)  The format for your next appointment:   In Person  Provider:   Ida Rogue, MD

## 2021-04-15 ENCOUNTER — Telehealth: Payer: Self-pay

## 2021-04-15 LAB — TSH: TSH: 2.64 u[IU]/mL (ref 0.450–4.500)

## 2021-04-15 NOTE — Telephone Encounter (Signed)
[place] Patient Assistance Forms for Eliquis 5 mg faxed to [phone] and placed in the file cabinet.

## 2021-04-16 NOTE — Telephone Encounter (Signed)
Received a notice from [place] Patient [facility] that the patient had not included forms that she filed federal taxes.  The patient stated, she does file federal taxes and the information was faxed and is showing on both the 1099-R & the 2021 Social Security Benefit Statement that she does file.  I spoke with Jinny Blossom with [place] [employer] that the federal income tax withheld is documented and was faxed. Jinny Blossom confirmed that she did see this documented and the application will be submitted.

## 2021-04-30 ENCOUNTER — Telehealth: Payer: Self-pay | Admitting: *Deleted

## 2021-04-30 NOTE — Chronic Care Management (AMB) (Signed)
  Chronic Care Management   Note  04/30/2021 Name: Brenda [name] MRN: [id number] DOB: 06/19/1951  Brenda [name] is a 70 y.o. year old female who is a primary care patient of Cirigliano, Garvin Fila, DO. I reached out to Vertell Limber by phone today in response to a referral sent by [name]'s PCP Ronnald Nian, DO     [name] was given information about Chronic Care Management services today including:  CCM service includes personalized support from designated clinical staff supervised by her physician, including individualized plan of care and coordination with other care providers 24/7 contact phone numbers for assistance for urgent and routine care needs. Service will only be billed when office clinical staff spend 20 minutes or more in a month to coordinate care. Only one practitioner may furnish and bill the service in a calendar month. The patient may stop CCM services at any time (effective at the end of the month) by phone call to the office staff. The patient will be responsible for cost sharing (co-pay) of up to 20% of the service fee (after annual deductible is met).  Patient agreed to services and verbal consent obtained.   Follow up plan: Telephone appointment with care management team member scheduled for:05/20/2021  Julian Hy, [place], [place] Management  Direct Dial: [phone]

## 2021-05-05 NOTE — Telephone Encounter (Signed)
Pt's household income is over current eligibility criteria. Pt assistance foundation response placed in nurse bin.

## 2021-05-06 ENCOUNTER — Telehealth: Payer: Self-pay | Admitting: Family Medicine

## 2021-05-06 ENCOUNTER — Telehealth: Payer: Self-pay

## 2021-05-06 NOTE — Telephone Encounter (Signed)
   Patient called and is requesting to [facility] to Dr. Sharlet Salina. Please advise

## 2021-05-06 NOTE — Telephone Encounter (Signed)
Fax received from [place] for Eliquis, Pt is not eligible d/t household income is over current eligibility criteria.  Fax placed in file cabinet   Application Case # [id number]

## 2021-05-06 NOTE — Telephone Encounter (Signed)
Ok with me 

## 2021-05-08 NOTE — Telephone Encounter (Signed)
[place]

## 2021-05-11 NOTE — Progress Notes (Signed)
HEMATOLOGY/ONCOLOGY CLINIC NOTE  Date of Service: 05/12/21    Patient Care Team: Ronnald Nian, DO as PCP - General (Family Medicine) Rockey Situ Kathlene November, MD as PCP - Cardiology (Cardiology) Minna Merritts, MD as Consulting Physician (Cardiology) Dannielle Karvonen, RN as [place] Management  CHIEF COMPLAINTS/PURPOSE OF CONSULTATION:  F/u for continue mx of CLL  HISTORY OF PRESENTING ILLNESS:   Brenda [name] is a wonderful 70 y.o. female who was hospitalized on 11/17/17 for abdominal discomfort, nausea and anorexia.   Further workup showed elevated kidney function tests and CT of the abd and pelvis demonstrated pelvic lymphadenopathy and innumerable pulmonary nodules in the bases concerning for metastases.  Today she is here for an outpatient follow up accompanied by her friend. She notes she has not felt her usual self in the past 2 months with fatigue and lack of appetite. She also noticed diarrhea that comes and goes and her energy level would continue to drop. She has lost 25-30 pounds since end of summer 2018. She will notice mucous with her stool but no bloody or black stool. She does not take any meds that she would associate with her diarrhea or lower appetite and she denies change in her chronic medications. She has been on Nexium for a long period of time. She notes she does not often get sick. Dr. Deborra Medina is her PCP. She was using 229m ibuprofen 2-3 times a day more often recently due to her hip pain.   She notes her mother had breast cancer and diagnosed at 548yo. Her father had a rare form of cancer who died 3 weeks after diagnosis in his 876s Her brother passed from non-alcohol liver cirrhosis.   On review of symptoms, pt notes since her discharge she no longer has abdominal pain but will still have occasional diarrhea. She has not had diarrhea in several days. She notes to having a slight cough but mostly sinus drainage. Her appetite and eating has  improved. She notes due to her knee replacement and hip fracture she will have swelling in her left leg. She has lower back pain at times. She denies feeling lumps or bumps in her axilla b/l or groin. She denies dysuria or change in her urination.   Interval History:  CDEANIA SIGUENZAreturns today regarding her Chronic Lymphocytic Leukemia. The patient's last visit with [facility] on 11/12/2020. The pt reports that she is doing well overall.  The pt reports that she now has Afib as of June. She notes that she picks this up by checking her heart rate regularly twice each day. There was much fluctuations in this rate and would stay between 115-130 then be at around 60. She notes that she is now on Eliquis and this is very expensive for her. She is also on Jardiance. Both medications are around $160 each month. The patient notes that she will be selling her house and moving into a senior independent living.  The patient notes that around three days after starting her Afib medications, she experienced a real bad cold with coughing and fatigue. She never had a fever or chills or persistent sore throat. The patient notes she coughed so much that she started to have rib pain. She increased her Metoprolol and was able to stabilize her HR after that. It is now not more than 70.  Lab results today 05/11/2021 of CBC w/diff and CMP is as follows: all values are WNL except for  WBC of 11.9K, Plt of 145K, Lymphs Abs of 7.7K, Glucose of 148, BUN of 34, Creatinine of 1.57, GFR est of 35. 05/11/2021 LDH of 192.  On review of systems, pt reports stress, anxiety, Afib, recent cold and denies new lumps/bumps, fevers, chills, night sweats, changes in bowel habits, acute leg swelling, and any other symptoms.   MEDICAL HISTORY:  Past Medical History:  Diagnosis Date   Anxiety    Arthritis    CLL (chronic lymphocytic leukemia) ([place])    Dx. 2019 Dr. 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 Particia Nearing first visit 11-2019    SURGICAL HISTORY: Past Surgical History:  Procedure Laterality Date   ABDOMINAL HYSTERECTOMY  1998   COLONOSCOPY     EYE SURGERY Bilateral    cataracts   EYE SURGERY Left    retina tear   FEMUR IM NAIL  10/25/2012   Procedure: INTRAMEDULLARY (IM) NAIL FEMORAL;  Surgeon: Mauri Pole, MD;  Location: [facility];  Service: Orthopedics;  Laterality: Left;   HIP SURGERY     Fracture L IM nail and 3 rods   left knee arthroscopy  12/2010   LYMPH NODE BIOPSY     axillary left 12-2018   REFRACTIVE SURGERY     skin cancer removed right and and lower forearm     squamoun in situ   TOTAL KNEE ARTHROPLASTY  12/20/2011   Procedure: TOTAL KNEE ARTHROPLASTY;  Surgeon: Gearlean Alf, MD;  Location: [facility];  Service: Orthopedics;  Laterality: Left;  Failed attempt at spinal    TOTAL SHOULDER ARTHROPLASTY Right 08/19/2016   Procedure: RIGHT TOTAL SHOULDER ARTHROPLASTY;  Surgeon: Justice Britain, MD;  Location: [place];  Service: Orthopedics;  Laterality: Right;   TOTAL SHOULDER ARTHROPLASTY Left 01/10/2020   Procedure: TOTAL SHOULDER ARTHROPLASTY;  Surgeon: Justice Britain, MD;  Location: [facility];  Service: Orthopedics;  Laterality: Left;  116mn    SOCIAL  HISTORY: Social History   Socioeconomic History   Marital status: Single    Spouse name: Not on file   Number of children: Not on file   Years of education: Not on file   Highest education level: Not on file  Occupational History   Occupation: RTherapist, 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    Employer: [employer]  Tobacco Use   Smoking status: Never   Smokeless tobacco: Never  Vaping Use   Vaping Use: Never used  Substance and Sexual Activity   Alcohol use: No   Drug use: No   Sexual activity:  Not Currently  Other Topics Concern   Not on file  Social History Narrative   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            Social Determinants of Health   Financial Resource Strain: Low Risk    Difficulty of Paying Living Expenses: Not hard at all  Food Insecurity: No Food Insecurity   Worried About Charity fundraiser in the Last Year: Never true   Arboriculturist in the Last Year: Never true  Transportation Needs: No Transportation Needs   Lack of Transportation (Medical): No   Lack of Transportation (Non-Medical): No  Physical Activity: Inactive   Days of Exercise per Week: 0 days   Minutes of Exercise per Session: 0 min  Stress: No Stress Concern Present   Feeling of Stress : Not at all  Social Connections: Moderately Isolated   Frequency of Communication with Friends and Family: More than three times a week   Frequency of Social Gatherings with Friends and Family: More than three times a week   Attends Religious Services: More than 4 times per year   Active Member of [employer] or Organizations: No   Attends Music therapist: Never   Marital Status: Never married  Human resources officer Violence: Not At Risk   Fear of Current or Ex-Partner: No   Emotionally Abused: No   Physically Abused: No   Sexually Abused: No    FAMILY HISTORY: Family History  Problem Relation Age of Onset   Cancer Mother        breast cancer   Cancer Father        plasma sarcoma   Breast cancer Neg Hx      ALLERGIES:  is allergic to amlodipine and nsaids.  MEDICATIONS:  Current Outpatient Medications  Medication Sig Dispense Refill   acetaminophen (TYLENOL) 500 MG tablet Take 500-1,000 mg by mouth every 6 (six) hours as needed for mild pain, moderate pain or fever.      ALPRAZolam (XANAX) 1 MG tablet TAKE 1/2 TO 1 TABLET(0.5 TO 1 MG) BY MOUTH TWICE DAILY AS NEEDED FOR ANXIETY 60 tablet 2   apixaban (ELIQUIS) 5 MG TABS tablet Take 1 tablet (5 mg total) by mouth 2 (two) times daily. 30 tablet 11   BD PEN NEEDLE NANO 2ND GEN 32G X 4 MM MISC USE DAILY AS DIRECTED 100 each 3   Blood Glucose Monitoring Suppl (ONETOUCH VERIO) w/Device KIT Use as advised 1 kit 0   buPROPion (WELLBUTRIN XL) 300 MG 24 hr tablet Take 1 tablet (300 mg total) by mouth daily. 90 tablet 3   Cholecalciferol (VITAMIN D3) 2000 units TABS Take 2,000 mcg by mouth daily.     Cyanocobalamin (B-12) 1000 MCG SUBL Place 1,000 mcg under the tongue every evening.      doxazosin (CARDURA) 2 MG tablet Take 2 mg by mouth daily.     empagliflozin (JARDIANCE) 10 MG TABS tablet Take 1 tablet (10 mg total) by mouth daily before breakfast. 30 tablet 11   esomeprazole (NEXIUM) 20 MG capsule Take 20 mg by mouth daily as needed (acid reflux symptoms).      ezetimibe (ZETIA) 10 MG tablet TAKE 1 TABLET(10 MG) BY MOUTH DAILY 90 tablet 3   glipiZIDE (GLUCOTROL XL) 2.5 MG 24 hr tablet Take 2 tablets in the evening 180 tablet 3   glucose blood (ONETOUCH VERIO) test strip USE TO CHECK BLOOD SUGAR THREE TIMES DAILY 300 strip 1   insulin glargine (  LANTUS SOLOSTAR) 100 UNIT/ML Solostar Pen Inject 24 Units into the skin daily.     iron polysaccharides (NIFEREX) 150 MG capsule Take 150 mg by mouth at bedtime.      Lancet Devices (LANCING DEVICE) MISC Use as advised - Freestyle 1 each 0   lisinopril (ZESTRIL) 20 MG tablet Take 1 tablet (20 mg total) by mouth daily. 90 tablet 3   metoprolol succinate (TOPROL-XL) 50 MG 24 hr tablet Take 1 tablet (50 mg) by  mouth twice daily as directed (Patient taking differently: Take 2 tablet (50 mg) by mouth once daily as directed) 180 tablet 0   NONFORMULARY OR COMPOUNDED ITEM Apply 1 application topically 2 (two) times daily as needed (sun damage on face). FLUOROURACIL 5% + CALCIPOTRIENE 0.005%     simvastatin (ZOCOR) 40 MG tablet TAKE 1 TABLET(40 MG) BY MOUTH AT BEDTIME 90 tablet 3   triamcinolone cream (KENALOG) 0.1 % Apply 1 application topically 2 (two) times daily as needed (skin rash.).      calcium-vitamin D (OSCAL WITH D) 500-200 MG-UNIT tablet Take 1 tablet by mouth. With vtamin E (Patient not taking: Reported on 05/12/2021)     ondansetron (ZOFRAN) 4 MG tablet Take 1 tablet (4 mg total) by mouth every 8 (eight) hours as needed for nausea or vomiting. (Patient not taking: Reported on 05/12/2021) 20 tablet 0   No current facility-administered medications for this visit.    REVIEW OF SYSTEMS:   10 Point review of Systems was done is negative except as noted above.  PHYSICAL EXAMINATION: ECOG PERFORMANCE STATUS: 1 - Symptomatic but completely ambulatory  Vitals:   05/12/21 0849  BP: (!) 148/67  Pulse: 72  Resp: 18  Temp: 98.2 F (36.8 C)  SpO2: 99%    Filed Weights   05/12/21 0849  Weight: 189 lb (85.7 kg)    .Body mass index is 36.91 kg/m.   GENERAL:alert, in no acute distress and comfortable SKIN: no acute rashes, no significant lesions EYES: conjunctiva are pink and non-injected, sclera anicteric OROPHARYNX: MMM, no exudates, no oropharyngeal erythema or ulceration NECK: supple, no JVD LYMPH:  Minimal cervical lymph nodes that are unconcerning. No lymph nodes in axillary or inguinal regions. LUNGS: clear to auscultation b/l with normal respiratory effort HEART: regular rate & rhythm ABDOMEN:  normoactive bowel sounds , non tender, not distended. No palpable hepatosplenomegaly.  Extremity: no pedal edema PSYCH: alert & oriented x 3 with fluent speech NEURO: no focal  motor/sensory deficits  LABORATORY DATA:  I have reviewed the data as listed  . CBC Latest Ref Rng & Units 05/12/2021 11/12/2020 07/08/2020  WBC 4.0 - 10.5 K/uL 11.9(H) 12.1(H) 13.4(H)  Hemoglobin 12.0 - 15.0 g/dL 12.5 12.8 10.8(L)  Hematocrit 36.0 - 46.0 % 39.7 41.0 34.0(L)  Platelets 150 - 400 K/uL 145(L) 170 272.0  hgb 10.7 ANC 3.2k .Marland Kitchen CBC    Component Value Date/Time   WBC 11.9 (H) 05/12/2021 0831   RBC 4.44 05/12/2021 0831   HGB 12.5 05/12/2021 0831   HGB 11.9 (L) 05/12/2020 0855   HCT 39.7 05/12/2021 0831   PLT 145 (L) 05/12/2021 0831   PLT 132 (L) 05/12/2020 0855   MCV 89.4 05/12/2021 0831   MCH 28.2 05/12/2021 0831   MCHC 31.5 05/12/2021 0831   RDW 13.1 05/12/2021 0831   LYMPHSABS 7.7 (H) 05/12/2021 0831   MONOABS 0.6 05/12/2021 0831   EOSABS 0.2 05/12/2021 0831   BASOSABS 0.1 05/12/2021 0831    CMP Latest Ref Rng & Units  05/12/2021 03/11/2021 11/12/2020  Glucose 70 - 99 mg/dL 148(H) 144(H) 141(H)  BUN 8 - 23 mg/dL 34(H) 57(H) 41(H)  Creatinine 0.44 - 1.00 mg/dL 1.57(H) 1.80(H) 1.63(H)  Sodium 135 - 145 mmol/L 141 139 140  Potassium 3.5 - 5.1 mmol/L 5.1 5.0 5.2(H)  Chloride 98 - 111 mmol/L 107 109 108  CO2 22 - 32 mmol/L 24 22 25   Calcium 8.9 - 10.3 mg/dL 9.5 9.4 9.6  Total Protein 6.5 - 8.1 g/dL 6.1(L) - 6.6  Total Bilirubin 0.3 - 1.2 mg/dL 0.7 - 0.9  Alkaline Phos 38 - 126 U/L 75 - 86  AST 15 - 41 U/L 15 - 17  ALT 0 - 44 U/L 11 - 14   . Lab Results  Component Value Date   LDH 192 05/12/2021      12/15/17 FISH CLL Prognostic Panel:    01/03/19 Left axillary LN biopsy:    RADIOGRAPHIC STUDIES: I have personally reviewed the radiological images as listed and agreed with the findings in the report. No results found.  ASSESSMENT & PLAN:  Brenda [name] is a 70 y.o. 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 Lab Results  Component Value Date   LDH 192 05/12/2021   05/01/18 CT C/A/P revealed Mildly progressive lymphadenopathy in the chest, abdomen, and pelvis, corresponding to the patient's known CLL, as above. Dominant left external iliac node measures 3.3 cm short axis, previously 2.8cm. Spleen is normal in size. Numerous bilateral pulmonary nodules measuring up to 9 mm, grossly unchanged from 2017. Prior bilateral pleural effusions have resolved.   09/22/18 [place] Bilateral Axilla revealed Ultrasound is performed, showing numerous enlarged LEFT axillary lymph nodes. Largest lymph node in the LEFT axilla has cortical thickening of 9 millimeters. Largest lymph node is 3.3 centimeters in length. Evaluation of the contralateral axilla for comparison demonstrate lymph nodes with cortical thickening. Largest lymph node in the RIGHT axilla is 3.4 x 1.3 centimeters.  01/03/19 Left axillary LN biopsy revealed CLL  2. Multiple pulmonary nodules   CT AP on 11/17/17 - with 1. Pelvic lymphadenopathy and innumerable pulmonary nodules in the lung bases, consistent with metastatic disease. 2. Cholelithiasis without CT evidence of acute cholecystitis. 3.  Aortic atherosclerosis  CT Chest on 11/20/17 - with multiple small lung nodules in LLL that appear stable from prior CT in 12/2015 and are considered benign and Upper lung nodules also appear benign. Also with several borderline enlarged lymph nodes concerning for a lymphoproliferative disorder   3. H/o endometrial cancer in 1998 -localized to uterus -treated with surgery, abdominal hysterectomy  -no evidence of recurrence  4. Microcytic anemia - Fe deficiency  Presented in hospital with Hgb of 6.6 Hgb holding steady s/p 2U PRBC - Fe infusion completed 1/19 - stool hemoccult negative - suspect this may be due to CKD, but iron deficiency also worrisome for occult GIB   -Tolerated IV iron well  (hgb stable today).  5. Marginal B12 levels in hospital  (Antiparietal celll and anti IF Ab negative) -given one B12 injection in hospital -on replacement    PLAN:  -Discussed pt labwork today, 05/12/2021; WBC and other counts improving, chemistries stable. LDH stable and normal. -Recommended pt wear compression socks when active and elevate legs when resting. -No lab or clinical evidence  of CLL progression at this time -No indication for treatment of patients CLL at this time. -Recommended pt stay up to date with all COVID vaccinations. The pt has received her two boosters already. -Recommend pt continue skin screening with a dermatologist every 6 mo to 1 yr.  -Will see back in 6 months with labs.    FOLLOW UP: RTC with Dr Irene Limbo with labs in 6 months   The total time spent in the appt was 25 minutes and more than 50% was on counseling and direct patient cares.  All of the patient's questions were answered with apparent satisfaction. The patient knows to call the clinic with any problems, questions or concerns.   Sullivan Lone MD [place] AAHIVMS [facility] Hematology/Oncology Physician [facility]  (Office):       [phone] (Work cell):  [phone] (Fax):           [phone]  05/12/2021 10:03 AM  I, Reinaldo Raddle, am acting as scribe for Dr. Sullivan Lone, MD.    .I have reviewed the above documentation for accuracy and completeness, and I agree with the above. Brunetta Genera MD

## 2021-05-12 ENCOUNTER — Other Ambulatory Visit: Payer: Self-pay

## 2021-05-12 ENCOUNTER — Encounter: Payer: Self-pay | Admitting: Internal Medicine

## 2021-05-12 ENCOUNTER — Telehealth: Payer: Self-pay | Admitting: Hematology

## 2021-05-12 ENCOUNTER — Inpatient Hospital Stay: Payer: Medicare Other | Attending: Hematology

## 2021-05-12 ENCOUNTER — Inpatient Hospital Stay (HOSPITAL_BASED_OUTPATIENT_CLINIC_OR_DEPARTMENT_OTHER): Payer: Medicare Other | Admitting: Hematology

## 2021-05-12 VITALS — BP 148/67 | HR 72 | Temp 98.2°F | Resp 18 | Wt 189.0 lb

## 2021-05-12 DIAGNOSIS — C9110 Chronic lymphocytic leukemia of B-cell type not having achieved remission: Secondary | ICD-10-CM

## 2021-05-12 DIAGNOSIS — R918 Other nonspecific abnormal finding of lung field: Secondary | ICD-10-CM | POA: Insufficient documentation

## 2021-05-12 DIAGNOSIS — Z79899 Other long term (current) drug therapy: Secondary | ICD-10-CM | POA: Diagnosis not present

## 2021-05-12 DIAGNOSIS — Z8542 Personal history of malignant neoplasm of other parts of uterus: Secondary | ICD-10-CM | POA: Diagnosis not present

## 2021-05-12 DIAGNOSIS — I10 Essential (primary) hypertension: Secondary | ICD-10-CM | POA: Insufficient documentation

## 2021-05-12 DIAGNOSIS — Z90710 Acquired absence of both cervix and uterus: Secondary | ICD-10-CM | POA: Insufficient documentation

## 2021-05-12 DIAGNOSIS — D509 Iron deficiency anemia, unspecified: Secondary | ICD-10-CM | POA: Insufficient documentation

## 2021-05-12 DIAGNOSIS — E119 Type 2 diabetes mellitus without complications: Secondary | ICD-10-CM | POA: Insufficient documentation

## 2021-05-12 LAB — CMP (CANCER CENTER ONLY)
ALT: 11 U/L (ref 0–44)
AST: 15 U/L (ref 15–41)
Albumin: 3.9 g/dL (ref 3.5–5.0)
Alkaline Phosphatase: 75 U/L (ref 38–126)
Anion gap: 10 (ref 5–15)
BUN: 34 mg/dL — ABNORMAL HIGH (ref 8–23)
CO2: 24 mmol/L (ref 22–32)
Calcium: 9.5 mg/dL (ref 8.9–10.3)
Chloride: 107 mmol/L (ref 98–111)
Creatinine: 1.57 mg/dL — ABNORMAL HIGH (ref 0.44–1.00)
GFR, Estimated: 35 mL/min — ABNORMAL LOW (ref >60)
Glucose, Bld: 148 mg/dL — ABNORMAL HIGH (ref 70–99)
Potassium: 5.1 mmol/L (ref 3.5–5.1)
Sodium: 141 mmol/L (ref 135–145)
Total Bilirubin: 0.7 mg/dL (ref 0.3–1.2)
Total Protein: 6.1 g/dL — ABNORMAL LOW (ref 6.5–8.1)

## 2021-05-12 LAB — CBC WITH DIFFERENTIAL/PLATELET
Abs Immature Granulocytes: 0.05 10*3/uL (ref 0.00–0.07)
Basophils Absolute: 0.1 10*3/uL (ref 0.0–0.1)
Basophils Relative: 1 %
Eosinophils Absolute: 0.2 10*3/uL (ref 0.0–0.5)
Eosinophils Relative: 2 %
HCT: 39.7 % (ref 36.0–46.0)
Hemoglobin: 12.5 g/dL (ref 12.0–15.0)
Immature Granulocytes: 0 %
Lymphocytes Relative: 64 %
Lymphs Abs: 7.7 10*3/uL — ABNORMAL HIGH (ref 0.7–4.0)
MCH: 28.2 pg (ref 26.0–34.0)
MCHC: 31.5 g/dL (ref 30.0–36.0)
MCV: 89.4 fL (ref 80.0–100.0)
Monocytes Absolute: 0.6 10*3/uL (ref 0.1–1.0)
Monocytes Relative: 5 %
Neutro Abs: 3.3 10*3/uL (ref 1.7–7.7)
Neutrophils Relative %: 28 %
Platelets: 145 10*3/uL — ABNORMAL LOW (ref 150–400)
RBC: 4.44 MIL/uL (ref 3.87–5.11)
RDW: 13.1 % (ref 11.5–15.5)
WBC: 11.9 10*3/uL — ABNORMAL HIGH (ref 4.0–10.5)
nRBC: 0 % (ref 0.0–0.2)

## 2021-05-12 LAB — LACTATE DEHYDROGENASE: LDH: 192 U/L (ref 98–192)

## 2021-05-12 NOTE — Telephone Encounter (Signed)
Scheduled per los. Declined printout

## 2021-05-14 ENCOUNTER — Ambulatory Visit: Payer: Medicare Other | Admitting: Family Medicine

## 2021-05-20 ENCOUNTER — Encounter: Payer: Self-pay | Admitting: Family Medicine

## 2021-05-20 ENCOUNTER — Ambulatory Visit (INDEPENDENT_AMBULATORY_CARE_PROVIDER_SITE_OTHER): Payer: Medicare Other

## 2021-05-20 DIAGNOSIS — I480 Paroxysmal atrial fibrillation: Secondary | ICD-10-CM | POA: Diagnosis not present

## 2021-05-20 DIAGNOSIS — I10 Essential (primary) hypertension: Secondary | ICD-10-CM

## 2021-05-20 NOTE — Chronic Care Management (AMB) (Signed)
Chronic Care Management   CCM RN Visit Note  05/21/2021 Name: Brenda [name] MRN: [id number] DOB: [birth year]  Subjective: Brenda [name] is a 70 y.o. year old female who is a primary care patient of Ronnald Nian, DO. The care management team was consulted for assistance with disease management and care coordination needs.    Engaged with patient by telephone for initial visit in response to provider referral for case management and/or care coordination services.   Consent to Services:  The patient was given the following information about Chronic Care Management services today, agreed to services, and gave verbal consent: 1. CCM service includes personalized support from designated clinical staff supervised by the primary care provider, including individualized plan of care and coordination with other care providers 2. 24/7 contact phone numbers for assistance for urgent and routine care needs. 3. Service will only be billed when office clinical staff spend 20 minutes or more in a month to coordinate care. 4. Only one practitioner may furnish and bill the service in a calendar month. 5.The patient may stop CCM services at any time (effective at the end of the month) by phone call to the office staff. 6. The patient will be responsible for cost sharing (co-pay) of up to 20% of the service fee (after annual deductible is met). Patient agreed to services and consent obtained.  Patient agreed to services and verbal consent obtained.   Assessment: Review of patient past medical history, allergies, medications, health status, including review of consultants reports, laboratory and other test data, was performed as part of comprehensive evaluation and provision of chronic care management services.   SDOH (Social Determinants of Health) assessments and interventions performed:  SDOH Interventions    Flowsheet Row Most Recent Value  SDOH Interventions   Food Insecurity Interventions  Intervention Not Indicated  Housing Interventions Intervention Not Indicated  Transportation Interventions Intervention Not Indicated  Depression Interventions/Treatment  Medication        CCM Care Plan  Allergies  Allergen Reactions   Amlodipine     Swelling     Contrast Media [Iodinated Diagnostic Agents] Other (See Comments)    Patient states unable to take / use due to kidney function   Nsaids     Due to kidney function    Outpatient Encounter Medications as of 05/20/2021  Medication Sig   acetaminophen (TYLENOL) 500 MG tablet Take 500-1,000 mg by mouth every 6 (six) hours as needed for mild pain, moderate pain or fever.    apixaban (ELIQUIS) 5 MG TABS tablet Take 1 tablet (5 mg total) by mouth 2 (two) times daily.   buPROPion (WELLBUTRIN XL) 300 MG 24 hr tablet Take 1 tablet (300 mg total) by mouth daily.   Cholecalciferol (VITAMIN D3) 2000 units TABS Take 2,000 mcg by mouth daily.   Cyanocobalamin (B-12) 1000 MCG SUBL Place 1,000 mcg under the tongue every evening.    doxazosin (CARDURA) 2 MG tablet Take 2 mg by mouth daily.   empagliflozin (JARDIANCE) 10 MG TABS tablet Take 1 tablet (10 mg total) by mouth daily before breakfast.   esomeprazole (NEXIUM) 20 MG capsule Take 20 mg by mouth daily as needed (acid reflux symptoms).    ezetimibe (ZETIA) 10 MG tablet TAKE 1 TABLET(10 MG) BY MOUTH DAILY   glipiZIDE (GLUCOTROL XL) 2.5 MG 24 hr tablet Take 2 tablets in the evening   insulin glargine (LANTUS SOLOSTAR) 100 UNIT/ML Solostar Pen Inject 24 Units into the skin daily.   iron polysaccharides (  NIFEREX) 150 MG capsule Take 150 mg by mouth at bedtime.    lisinopril (ZESTRIL) 20 MG tablet Take 1 tablet (20 mg total) by mouth daily.   metoprolol succinate (TOPROL-XL) 50 MG 24 hr tablet Take 1 tablet (50 mg) by mouth twice daily as directed (Patient taking differently: Take 2 tablet (50 mg) by mouth once daily as directed)   simvastatin (ZOCOR) 40 MG tablet TAKE 1 TABLET(40 MG) BY  MOUTH AT BEDTIME   triamcinolone cream (KENALOG) 0.1 % Apply 1 application topically 2 (two) times daily as needed (skin rash.).    [DISCONTINUED] ALPRAZolam (XANAX) 1 MG tablet TAKE 1/2 TO 1 TABLET(0.5 TO 1 MG) BY MOUTH TWICE DAILY AS NEEDED FOR ANXIETY   BD PEN NEEDLE NANO 2ND GEN 32G X 4 MM MISC USE DAILY AS DIRECTED   Blood Glucose Monitoring Suppl (ONETOUCH VERIO) w/Device KIT Use as advised   calcium-vitamin D (OSCAL WITH D) 500-200 MG-UNIT tablet Take 1 tablet by mouth. With vtamin E (Patient not taking: Reported on 05/12/2021)   glucose blood (ONETOUCH VERIO) test strip USE TO CHECK BLOOD SUGAR THREE TIMES DAILY   Lancet Devices (LANCING DEVICE) MISC Use as advised - Freestyle   NONFORMULARY OR COMPOUNDED ITEM Apply 1 application topically 2 (two) times daily as needed (sun damage on face). FLUOROURACIL 5% + CALCIPOTRIENE 0.005%   ondansetron (ZOFRAN) 4 MG tablet Take 1 tablet (4 mg total) by mouth every 8 (eight) hours as needed for nausea or vomiting. (Patient not taking: Reported on 05/12/2021)   No facility-administered encounter medications on file as of 05/20/2021.    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   Well woman exam without gynecological exam 05/02/2019   SCC (squamous cell carcinoma), hand, right 05/02/2019   Advice given about COVID-19 virus infection 05/02/2019   CKD (chronic kidney disease) stage 3, GFR 30-59 ml/min (HCC) 07/05/2018   CLL (chronic lymphocytic leukemia) ([place]) 03/13/2018   Anxiety 03/13/2018   Bilateral edema of lower extremity 01/18/2018   Pleural effusion, bilateral 12/19/2017   Acute on chronic renal failure ([place]) 11/28/2017   Hypokalemia  11/28/2017   Hypotension 11/18/2017   History of endometrial cancer 11/18/2017   Pelvic lymphadenopathy 11/18/2017   Microcytic anemia 11/18/2017   Daytime sleepiness 08/22/2017   Medication side effect, initial encounter 08/22/2017   Coronary artery disease of native artery of native heart with stable angina pectoris ([place]) 04/03/2017   S/P shoulder replacement, right 08/19/2016   Type 2 diabetes mellitus with hyperglycemia, without long-term current use of insulin ([place]) 10/15/2015   Obesity 01/24/2015   Generalized anxiety disorder 01/02/2015   Chronic radicular low back pain 04/01/2014   Tachycardia 01/31/2013   Hip fracture, left ([place]) 10/24/2012   Postop Hyponatremia 12/21/2011   OA (osteoarthritis) 09/20/2011   Hyperlipidemia 11/06/2010   Depression with anxiety 11/06/2010   Essential hypertension 11/06/2010   GERD 11/06/2010    Conditions to be addressed/monitored:Atrial Fibrillation and HTN  Care Plan : Cardiovascular  Updates made by Dannielle Karvonen, RN since 05/21/2021 12:00 AM   Problem: Minimize/ prevent disease progression ( Atrial fibrillation/ hypertension)   Priority: High   Long-Range Goal: Monitoring  and management of Atrial fibrillation and hypertension   Start Date: 05/20/2021  Expected End Date: 08/31/2021  This Visit's Progress: On track  Priority: High  Current Barriers:  Knowledge deficits related to long term care plan for self health management of health conditions: Atrial fibrillation and hypertension  Patient newly diagnosed with Atrial fibrillation in June 2022.  Patient reports recent blood pressures 130/66, 149/79, 150/78.  Patient reports she has an echocardiogram scheduled for 05/22/2021 and next follow up with cardiologist is in September 2022. Patient states she is having difficulty affording her Eliquis and Jardiance. Patient reports her primary care provider will be leaving the practice in August 2022. She states she has acquired a new primary  care provider and reports the earliest visit she could get with her new provider is in December 2022.  Nurse Case Manager Clinical Goal(s):  patient will take all medications exactly as prescribed and will call provider for medication related questions patient will verbalize understanding of Afib Action Plan and when to call doctor patient will take medications as prescribed and will call provider for medication related questions patient will attend  scheduled medical appointments the patient will demonstrate ongoing self health care management ability as evidenced by monitoring and recording blood pressure and pulse daily and reporting any abnormal values to provider Patient will keep recommended provider appointments.  Patient will work with practice pharmacist regarding medication assistance.  Interventions:  Collaboration with Ronnald Nian, DO regarding development and update of comprehensive plan of care as evidenced by provider attestation and co-signature Inter-disciplinary care team collaboration (see longitudinal plan of care) Basic overview and discussion of afib disease state Discussed Atrial fibrillation action plan and provided to patient in writing.  Evaluation of current treatment plan related to Atrial fibrillation and hypertension and patient's adherence to plan as established by provider. Referred patient to practice pharmacist for medication assistance.  Reviewed medications with patient and discussed Advised patient, providing education and rationale, to monitor blood pressure and pulse rate daily and record, calling providing with  findings outside established parameters.  Provided patient with written educational materials related to Atrial fibrillation action plan and managing hypertension Reviewed scheduled/upcoming provider appointments  Patient Goals: - check pulse (heart) rate and blood pressure daily and record - Plan to eat a heart healthy, low salt diet -   Keep follow up appointments with your provider - Take your medications as prescribed - Review education material on Atrial fibrillation action plan, hypertension, low salt heart healthy diet - work with practice pharmacist regarding medication assistance.  Follow Up Plan: The patient has been provided with contact information for the care management team and has been advised to call with any health related questions or concerns.  The care management team will reach out to the patient again over the next 45 days.        Plan:The patient has been provided with contact information for the care management team and has been advised to call with any health related questions or concerns.  and The care management team will reach out to the patient again over the next 45 days. Quinn Plowman RN,BSN,CCM RN Case Manager [place] [phone]

## 2021-05-21 MED ORDER — ALPRAZOLAM 1 MG PO TABS: ORAL_TABLET | ORAL | 2 refills | Status: DC

## 2021-05-21 NOTE — Patient Instructions (Addendum)
Visit Information:  Thank you for taking the time to speak with me today    PATIENT GOALS:   Goals Addressed             This Visit's Progress    Track and Manage Heart Rate and Rhythm-Atrial Fibrillation and Blood pressure   On track    Timeframe:  Long-Range Goal Priority:  High Start Date:    05/20/2021                         Expected End Date:   08/31/2021                    Follow Up Date 06/24/2021    - check pulse (heart) rate and blood pressure daily and record - Plan to eat a heart healthy, low salt diet -  Keep follow up appointments with your provider - Take your medications as prescribed - Review education material on Atrial fibrillation action plan, hypertension, low salt heart healthy diet - work with practice pharmacist regarding medication assistance.    Why is this important?   Atrial fibrillation may have no symptoms. Sometimes the symptoms get worse or happen more often.  It is important to keep track of what your symptoms are and when they happen.  A change in symptoms is important to discuss with your doctor or nurse.  Being active and healthy eating will also help you manage your heart condition.             Consent to CCM Services: Ms. Harkless was given information about Chronic Care Management services today including:  CCM service includes personalized support from designated clinical staff supervised by her physician, including individualized plan of care and coordination with other care providers 24/7 contact phone numbers for assistance for urgent and routine care needs. Service will only be billed when office clinical staff spend 20 minutes or more in a month to coordinate care. Only one practitioner may furnish and bill the service in a calendar month. The patient may stop CCM services at any time (effective at the end of the month) by phone call to the office staff. The patient will be responsible for cost sharing (co-pay) of up to 20% of the  service fee (after annual deductible is met).  Patient agreed to services and verbal consent obtained.   Patient verbalizes understanding of instructions provided today and agrees to view in [place].   The patient has been provided with contact information for the care management team and has been advised to call with any health related questions or concerns.  The care management team will reach out to the patient again over the next 45 days.  Quinn Plowman RN,BSN,CCM RN Case Manager [place]  [phone]   CLINICAL CARE PLAN: Patient Care Plan: Cardiovascular   Problem Identified: Minimize/ prevent disease progression ( Atrial fibrillation/ hypertension)   Priority: High   Long-Range Goal: Monitoring and management of Atrial fibrillation and hypertension   Start Date: 05/20/2021  Expected End Date: 08/31/2021  This Visit's Progress: On track  Priority: High  Current Barriers:  Knowledge deficits related to long term care plan for self health management of health conditions: Atrial fibrillation and hypertension  Patient newly diagnosed with Atrial fibrillation in June 2022.  Patient reports recent blood pressures 130/66, 149/79, 150/78.  Patient reports she has an echocardiogram scheduled for 05/22/2021 and next follow up with cardiologist is in September 2022. Patient states  she is having difficulty affording her Eliquis and Jardiance. Patient reports her primary care provider will be leaving the practice in August 2022. She states she has acquired a new primary care provider and reports the earliest visit she could get with her new provider is in December 2022.  Nurse Case Manager Clinical Goal(s):  patient will take all medications exactly as prescribed and will call provider for medication related questions patient will verbalize understanding of Afib Action Plan and when to call doctor patient will take medications as prescribed and will call provider for medication  related questions patient will attend  scheduled medical appointments the patient will demonstrate ongoing self health care management ability as evidenced by monitoring and recording blood pressure and pulse daily and reporting any abnormal values to provider Patient will keep recommended provider appointments.  Patient will work with practice pharmacist regarding medication assistance.  Interventions:  Collaboration with Ronnald Nian, DO regarding development and update of comprehensive plan of care as evidenced by provider attestation and co-signature Inter-disciplinary care team collaboration (see longitudinal plan of care) Basic overview and discussion of afib disease state Discussed Atrial fibrillation action plan and provided to patient in writing.  Evaluation of current treatment plan related to Atrial fibrillation and hypertension and patient's adherence to plan as established by provider. Referred patient to practice pharmacist for medication assistance.  Reviewed medications with patient and discussed Advised patient, providing education and rationale, to monitor blood pressure and pulse rate daily and record, calling providing with  findings outside established parameters.  Provided patient with written educational materials related to Atrial fibrillation action plan and managing hypertension Reviewed scheduled/upcoming provider appointments  Patient Goals: - check pulse (heart) rate and blood pressure daily and record - Plan to eat a heart healthy, low salt diet -  Keep follow up appointments with your provider - Take your medications as prescribed - Review education material on Atrial fibrillation action plan, hypertension, low salt heart healthy diet - work with practice pharmacist regarding medication assistance.  Follow Up Plan: The patient has been provided with contact information for the care management team and has been advised to call with any health related  questions or concerns.  The care management team will reach out to the patient again over the next 45 days.

## 2021-05-22 ENCOUNTER — Other Ambulatory Visit: Payer: Self-pay

## 2021-05-22 ENCOUNTER — Ambulatory Visit (INDEPENDENT_AMBULATORY_CARE_PROVIDER_SITE_OTHER): Payer: Medicare Other

## 2021-05-22 DIAGNOSIS — I4891 Unspecified atrial fibrillation: Secondary | ICD-10-CM

## 2021-05-22 LAB — ECHOCARDIOGRAM COMPLETE
AR max vel: 2.14 cm2
AV Area VTI: 2.45 cm2
AV Area mean vel: 2.24 cm2
AV Mean grad: 4 mmHg
AV Peak grad: 8 mmHg
Ao pk vel: 1.41 m/s
Area-P 1/2: 3.16 cm2
S' Lateral: 3 cm

## 2021-05-22 MED ORDER — PERFLUTREN LIPID MICROSPHERE
1.0000 mL | INTRAVENOUS | Status: AC | PRN
  Administered 2021-05-22: 2 mL via INTRAVENOUS

## 2021-05-22 NOTE — Telephone Encounter (Signed)
Patient checking in to see if office received labs and were okay with her levels  If any concerns please advise patient

## 2021-05-25 ENCOUNTER — Telehealth: Payer: Self-pay | Admitting: *Deleted

## 2021-05-25 NOTE — Telephone Encounter (Signed)
Spoke with patient and reviewed recommendations and she verbalized understanding with no further questions at this time.

## 2021-05-25 NOTE — Telephone Encounter (Signed)
No answer/Voicemail box is full.

## 2021-05-25 NOTE — Telephone Encounter (Signed)
-----   Message from [place], PA-C sent at 05/25/2021  9:54 AM EDT ----- Hemoglobin stable on Eliquis. Continue current follow-up.   ----- Message ----- From: Valora Corporal, RN Sent: 05/22/2021   2:22 PM EDT To: Minna Merritts, MD, Cadence Ninfa Meeker, PA-C  Labs we requested and were done at [facility]. Please review.

## 2021-06-08 ENCOUNTER — Other Ambulatory Visit: Payer: Self-pay

## 2021-06-08 ENCOUNTER — Other Ambulatory Visit: Payer: Self-pay | Admitting: Cardiovascular Disease

## 2021-06-08 NOTE — Telephone Encounter (Signed)
*  STAT* If patient is at the pharmacy, call can be transferred to refill team.   1. Which medications need to be refilled? (please list name of each medication and dose if known) Metoprolol  2. Which pharmacy/location (including street and city if local pharmacy) is medication to be sent to? [facility]  3. Do they need a 30 day or 90 day supply? [place]

## 2021-06-10 ENCOUNTER — Telehealth: Payer: Self-pay | Admitting: Cardiovascular Disease

## 2021-06-10 ENCOUNTER — Other Ambulatory Visit: Payer: Self-pay | Admitting: Cardiovascular Disease

## 2021-06-10 NOTE — Telephone Encounter (Signed)
*  STAT* If patient is at the pharmacy, call can be transferred to refill team.   1. Which medications need to be refilled? (please list name of each medication and dose if known) Metoprolol 1 tablet twice daily  2. Which pharmacy/location (including street and city if local pharmacy) is medication to be sent to? [place], [place]  3. Do they need a 30 day or 90 day supply? 90 day  **Patient states needs another refill lost it after picking up

## 2021-06-10 NOTE — Telephone Encounter (Signed)
metoprolol succinate (TOPROL-XL) 50 MG 24 hr tablet 180 tablet 0 06/10/2021    Sig: TAKE 1 TABLET(50 MG) BY MOUTH TWICE DAILY AS DIRECTED   Sent to pharmacy as: metoprolol succinate (TOPROL-XL) 50 MG 24 hr tablet   Notes to Pharmacy: Pt lost medication needing refill.   E-Prescribing Status: Receipt confirmed by pharmacy (06/10/2021 12:06 PM EDT)

## 2021-06-24 ENCOUNTER — Ambulatory Visit (INDEPENDENT_AMBULATORY_CARE_PROVIDER_SITE_OTHER): Payer: Medicare Other

## 2021-06-24 DIAGNOSIS — I480 Paroxysmal atrial fibrillation: Secondary | ICD-10-CM | POA: Diagnosis not present

## 2021-06-24 DIAGNOSIS — I10 Essential (primary) hypertension: Secondary | ICD-10-CM | POA: Diagnosis not present

## 2021-06-24 NOTE — Patient Instructions (Signed)
Visit Information:  Thank you for taking the time to speak with me today.  PATIENT GOALS:  Goals Addressed             This Visit's Progress    Track and Manage Heart Rate and Rhythm-Atrial Fibrillation and Blood pressure   On track    Timeframe:  Long-Range Goal Priority:  High Start Date:    05/20/2021                         Expected End Date:   09/30/2021                   Follow Up Date 08/12/2021    - Continue to check pulse (heart) rate and blood pressure daily and record - Plan to eat a heart healthy, low salt diet -  Keep follow up appointments with your provider -  Continue to take your medications as prescribed - work with practice pharmacist regarding medication assistance as needed.    Why is this important?   Atrial fibrillation may have no symptoms. Sometimes the symptoms get worse or happen more often.  It is important to keep track of what your symptoms are and when they happen.  A change in symptoms is important to discuss with your doctor or nurse.  Being active and healthy eating will also help you manage your heart condition.             Patient verbalizes understanding of instructions provided today and agrees to view in [place].   The patient has been provided with contact information for the care management team and has been advised to call with any health related questions or concerns.  The care management team will reach out to the patient again over the next 45 days.   Quinn Plowman RN,BSN,CCM RN Case Manager [place] [phone]

## 2021-06-24 NOTE — Chronic Care Management (AMB) (Signed)
Chronic Care Management   CCM RN Visit Note  06/24/2021 Name: Brenda [name] MRN: [id number] DOB: 09/11/1951  Subjective: Brenda [name] is a 70 y.o. year old female who is a primary care patient of Ronnald Nian, DO. The care management team was consulted for assistance with disease management and care coordination needs.    Engaged with patient by telephone for follow up visit in response to provider referral for case management and/or care coordination services.   Consent to Services:  The patient was given information about Chronic Care Management services, agreed to services, and gave verbal consent prior to initiation of services.  Please see initial visit note for detailed documentation.   Patient agreed to services and verbal consent obtained.   Assessment: Review of patient past medical history, allergies, medications, health status, including review of consultants reports, laboratory and other test data, was performed as part of comprehensive evaluation and provision of chronic care management services.   SDOH (Social Determinants of Health) assessments and interventions performed:    CCM Care Plan  Allergies  Allergen Reactions   Amlodipine     Swelling     Contrast Media [Iodinated Diagnostic Agents] Other (See Comments)    Patient states unable to take / use due to kidney function   Nsaids     Due to kidney function    Outpatient Encounter Medications as of 06/24/2021  Medication Sig   acetaminophen (TYLENOL) 500 MG tablet Take 500-1,000 mg by mouth every 6 (six) hours as needed for mild pain, moderate pain or fever.    ALPRAZolam (XANAX) 1 MG tablet TAKE 1/2 TO 1 TABLET(0.5 TO 1 MG) BY MOUTH TWICE DAILY AS NEEDED FOR ANXIETY   apixaban (ELIQUIS) 5 MG TABS tablet Take 1 tablet (5 mg total) by mouth 2 (two) times daily.   BD PEN NEEDLE NANO 2ND GEN 32G X 4 MM MISC USE DAILY AS DIRECTED   Blood Glucose Monitoring Suppl (ONETOUCH VERIO) w/Device KIT Use as  advised   buPROPion (WELLBUTRIN XL) 300 MG 24 hr tablet Take 1 tablet (300 mg total) by mouth daily.   calcium-vitamin D (OSCAL WITH D) 500-200 MG-UNIT tablet Take 1 tablet by mouth. With vtamin E (Patient not taking: Reported on 05/12/2021)   Cholecalciferol (VITAMIN D3) 2000 units TABS Take 2,000 mcg by mouth daily.   Cyanocobalamin (B-12) 1000 MCG SUBL Place 1,000 mcg under the tongue every evening.    doxazosin (CARDURA) 2 MG tablet Take 2 mg by mouth daily.   empagliflozin (JARDIANCE) 10 MG TABS tablet Take 1 tablet (10 mg total) by mouth daily before breakfast.   esomeprazole (NEXIUM) 20 MG capsule Take 20 mg by mouth daily as needed (acid reflux symptoms).    ezetimibe (ZETIA) 10 MG tablet TAKE 1 TABLET(10 MG) BY MOUTH DAILY   glipiZIDE (GLUCOTROL XL) 2.5 MG 24 hr tablet Take 2 tablets in the evening   glucose blood (ONETOUCH VERIO) test strip USE TO CHECK BLOOD SUGAR THREE TIMES DAILY   insulin glargine (LANTUS SOLOSTAR) 100 UNIT/ML Solostar Pen Inject 24 Units into the skin daily.   iron polysaccharides (NIFEREX) 150 MG capsule Take 150 mg by mouth at bedtime.    Lancet Devices (LANCING DEVICE) MISC Use as advised - Freestyle   lisinopril (ZESTRIL) 20 MG tablet Take 1 tablet (20 mg total) by mouth daily.   metoprolol succinate (TOPROL-XL) 50 MG 24 hr tablet TAKE 1 TABLET(50 MG) BY MOUTH TWICE DAILY AS DIRECTED   NONFORMULARY OR COMPOUNDED  ITEM Apply 1 application topically 2 (two) times daily as needed (sun damage on face). FLUOROURACIL 5% + CALCIPOTRIENE 0.005%   ondansetron (ZOFRAN) 4 MG tablet Take 1 tablet (4 mg total) by mouth every 8 (eight) hours as needed for nausea or vomiting. (Patient not taking: Reported on 05/12/2021)   simvastatin (ZOCOR) 40 MG tablet TAKE 1 TABLET(40 MG) BY MOUTH AT BEDTIME   triamcinolone cream (KENALOG) 0.1 % Apply 1 application topically 2 (two) times daily as needed (skin rash.).    No facility-administered encounter medications on file as of 06/24/2021.     Patient Active Problem List   Diagnosis Date Noted   Fever    Acute respiratory failure with hypoxia ([place])    Multiple closed fractures of ribs of left side    Appendicitis    AKI (acute kidney injury) ([place]) 06/22/2020   Fall at home, initial encounter 06/22/2020   Osteopenia 05/22/2020   Status post total shoulder arthroplasty, left 01/10/2020   Well woman exam without gynecological exam 05/02/2019   SCC (squamous cell carcinoma), hand, right 05/02/2019   Advice given about COVID-19 virus infection 05/02/2019   CKD (chronic kidney disease) stage 3, GFR 30-59 ml/min (HCC) 07/05/2018   CLL (chronic lymphocytic leukemia) ([place]) 03/13/2018   Anxiety 03/13/2018   Bilateral edema of lower extremity 01/18/2018   Pleural effusion, bilateral 12/19/2017   Acute on chronic renal failure ([place]) 11/28/2017   Hypokalemia 11/28/2017   Hypotension 11/18/2017   History of endometrial cancer 11/18/2017   Pelvic lymphadenopathy 11/18/2017   Microcytic anemia 11/18/2017   Daytime sleepiness 08/22/2017   Medication side effect, initial encounter 08/22/2017   Coronary artery disease of native artery of native heart with stable angina pectoris ([place]) 04/03/2017   S/P shoulder replacement, right 08/19/2016   Type 2 diabetes mellitus with hyperglycemia, without long-term current use of insulin ([place]) 10/15/2015   Obesity 01/24/2015   Generalized anxiety disorder 01/02/2015   Chronic radicular low back pain 04/01/2014   Tachycardia 01/31/2013   Hip fracture, left ([place]) 10/24/2012   Postop Hyponatremia 12/21/2011   OA (osteoarthritis) 09/20/2011   Hyperlipidemia 11/06/2010   Depression with anxiety 11/06/2010   Essential hypertension 11/06/2010   GERD 11/06/2010    Conditions to be addressed/monitored:Atrial Fibrillation and HTN  Care Plan : Cardiovascular  Updates made by Dannielle Karvonen, RN since 06/24/2021 12:00 AM     Problem: Minimize/ prevent disease progression ( Atrial fibrillation/  hypertension)   Priority: High     Long-Range Goal: Monitoring and management of Atrial fibrillation and hypertension   Start Date: 05/20/2021  Expected End Date: 09/30/2021  This Visit's Progress: On track  Recent Progress: On track  Priority: Medium  Note:   Current Barriers:  Knowledge deficits related to long term care plan for self health management of health conditions: Atrial fibrillation and hypertension  Patient states she is doing well. Denies any new or ongoing symptoms. She reports recent blood pressure / pulse readings:  114/64 p-59,  111/58 p- 55, 115/54 p-57, 123/66 p- 66.  She reports having echocardiogram done last month and  states she has a follow up visit with the cardiologist on 07/20/2021.    Case Manager Clinical Goal(s):  patient will take medications  as prescribed and will call provider for medication related questions patient will verbalize understanding of Afib Action Plan and when to call doctor patient will attend  scheduled medical appointments the patient will demonstrate ongoing self health care management ability as evidenced by monitoring and  recording blood pressure and pulse daily and reporting any abnormal values to provider Patient will work with practice pharmacist regarding medication assistance.  Interventions:  Collaboration with Ronnald Nian, DO regarding development and update of comprehensive plan of care as evidenced by provider attestation and co-signature Inter-disciplinary care team collaboration (see longitudinal plan of care) Basic overview and discussion of afib disease state Discussed Atrial fibrillation action plan and provided to patient in writing.  Evaluation of current treatment plan related to Atrial fibrillation and hypertension and patient's adherence to plan as established by provider. Referred patient to practice pharmacist for medication assistance.  Reviewed medications with patient and discussed Advised patient,  providing education and rationale, to monitor blood pressure and pulse rate daily and record, calling providing with  findings outside established parameters.  Provided patient with written educational materials related to Atrial fibrillation action plan and managing hypertension Reviewed scheduled/upcoming provider appointments:  patient reports follow up visit with endocrinologist on 07/17/2021 and nephrologist on 07/10/2021.  Patient Goals: - Continue to check pulse (heart) rate and blood pressure daily and record - Plan to eat a heart healthy, low salt diet -  Keep follow up appointments with your provider -  Continue to take your medications as prescribed - work with practice pharmacist regarding medication assistance as needed.  Follow Up Plan: The patient has been provided with contact information for the care management team and has been advised to call with any health related questions or concerns.  The care management team will reach out to the patient again over the next 45 days.          Plan:The patient has been provided with contact information for the care management team and has been advised to call with any health related questions or concerns.  and The care management team will reach out to the patient again over the next 45 days. Quinn Plowman RN,BSN,CCM RN Case Manager [place]  [phone]

## 2021-07-01 ENCOUNTER — Other Ambulatory Visit: Payer: Self-pay

## 2021-07-01 DIAGNOSIS — I10 Essential (primary) hypertension: Secondary | ICD-10-CM

## 2021-07-01 MED ORDER — LISINOPRIL 20 MG PO TABS: 20.0000 mg | ORAL_TABLET | Freq: Every day | ORAL | 1 refills | Status: DC

## 2021-07-01 NOTE — Telephone Encounter (Signed)
Refill request for:  Lisinopril 20 mg LR 07/02/20, #90, 3 rf LOV 07/02/20 FOV  10/16/21 Brenda [name])  Please review and advise.  Thanks. Dm/cma

## 2021-07-10 DIAGNOSIS — E785 Hyperlipidemia, unspecified: Secondary | ICD-10-CM | POA: Diagnosis not present

## 2021-07-10 DIAGNOSIS — Z23 Encounter for immunization: Secondary | ICD-10-CM | POA: Diagnosis not present

## 2021-07-10 DIAGNOSIS — E1122 Type 2 diabetes mellitus with diabetic chronic kidney disease: Secondary | ICD-10-CM | POA: Diagnosis not present

## 2021-07-10 DIAGNOSIS — I129 Hypertensive chronic kidney disease with stage 1 through stage 4 chronic kidney disease, or unspecified chronic kidney disease: Secondary | ICD-10-CM | POA: Diagnosis not present

## 2021-07-10 DIAGNOSIS — I4891 Unspecified atrial fibrillation: Secondary | ICD-10-CM | POA: Diagnosis not present

## 2021-07-10 DIAGNOSIS — N1832 Chronic kidney disease, stage 3b: Secondary | ICD-10-CM | POA: Diagnosis not present

## 2021-07-10 DIAGNOSIS — C9110 Chronic lymphocytic leukemia of B-cell type not having achieved remission: Secondary | ICD-10-CM | POA: Diagnosis not present

## 2021-07-17 ENCOUNTER — Other Ambulatory Visit: Payer: Self-pay

## 2021-07-17 ENCOUNTER — Ambulatory Visit (INDEPENDENT_AMBULATORY_CARE_PROVIDER_SITE_OTHER): Payer: Medicare Other | Admitting: Internal Medicine

## 2021-07-17 ENCOUNTER — Encounter: Payer: Self-pay | Admitting: Internal Medicine

## 2021-07-17 VITALS — BP 108/62 | HR 75 | Ht 60.0 in | Wt 189.0 lb

## 2021-07-17 DIAGNOSIS — E1165 Type 2 diabetes mellitus with hyperglycemia: Secondary | ICD-10-CM | POA: Diagnosis not present

## 2021-07-17 DIAGNOSIS — Z6835 Body mass index (BMI) 35.0-35.9, adult: Secondary | ICD-10-CM

## 2021-07-17 DIAGNOSIS — I25118 Atherosclerotic heart disease of native coronary artery with other forms of angina pectoris: Secondary | ICD-10-CM | POA: Diagnosis not present

## 2021-07-17 LAB — POCT GLYCOSYLATED HEMOGLOBIN (HGB A1C): Hemoglobin A1C: 6.5 % — AB (ref 4.0–5.6)

## 2021-07-17 MED ORDER — GLIPIZIDE ER 2.5 MG PO TB24: ORAL_TABLET | ORAL | 3 refills | Status: DC

## 2021-07-17 NOTE — Patient Instructions (Addendum)
Please continue: - Lantus 24 units after dinner - Jardiance 10 mg before b'fast   Please decrease: - Glipizide XR 2.5 mg before dinner  Please return in 4 months with your sugar log.

## 2021-07-17 NOTE — Progress Notes (Signed)
Patient ID: Brenda [name], female   DOB: 1950-11-12, 70 y.o.   MRN: [id number]  This visit occurred during the SARS-CoV-2 public health emergency.  Safety protocols were in place, including screening questions prior to the visit, additional usage of staff PPE, and extensive cleaning of exam room while observing appropriate contact time as indicated for disinfecting solutions.   HPI: Brenda [name] is a 70 y.o.-year-old female, presenting for follow-up for DM2, dx in 2007, insulin-dependent, uncontrolled, with complications (CKD). Last visit 4 months ago.  Interim history: She has increased urination, but no blurry vision, nausea, chest pain. She was dx'ed with Afib since last OV.  She also had a fall after being stung by a wasp and fell in her driveway.  She states she broke her toe. She has an episode of significant congestion and because recently (COVID-negative). Since last visit, she saw her nephrologist and her kidney function improved.   She has lifelong insomnia.  In the past, she was waking up at 3 AM to go to work at 5:30 AM.  Reviewed HbA1c levels: Lab Results  Component Value Date   HGBA1C 7.0 (A) 03/11/2021   HGBA1C 7.6 (A) 11/11/2020   HGBA1C 6.4 (A) 07/08/2020   Patient is on: - Lantus 12 units in a.m.- started 05/2019 by PCP >> 24 units after dinner - Glipizide XR  2.5 mg in am and 5 mg before a large dinner >> 5 mg before dinner  - Farxiga 5 mg >> Jardiance 10 before breakfast-added 11/2020 She was on Glipizide in 11/2015 after a steroid inj. We stopped Victoza due to nausea. Metformin ER was stopped by PCP due to CKD. Glipizide ER was stopped 06/2019 due to low blood sugars.  Pt checks her sugars twice a day: - am: 63, 89-200 >> 130-140, 160, 200 (after b'day) >> 80-134, 145, 150 - 2h after b'fast: 156 >> n/c >> 148 >> n/c - before lunch: 58, 64-164 >> 50s (resolved), 90s-120 >> 58, 64-113, 126 - 2h after lunch: 118, 123 >> 92, 99 >> n/c >> 145 - before  dinner: 80-143, 151 >> 62-160, 170 >> 120-140 >> 82-148 - 2h after dinner:n/c >> 138, 148 >> n/c - bedtime: 91-143 >> n/c >> 107-128 >> 149-164 >> 130-140 >> 137, 142 - nighttime: n/c Lowest sugar was 37 x1 >> ... 50s >> 61; she has hypoglycemia awareness in the 29s. Highest sugar was 200 >> 160  Glucometer: Freestyle Lite  Pt's meals are: - Breakfast: English muffin + preserve or cereal or yoghurt - snack: fruit or fruit + yoghurt - Lunch: 1/2 sandwich + chips + salsa + fruit/sugar free >> protein bar - Dinner: soup or chicken/steak + veggies, occasional starch or Lean cuisine or light salad No soft drinks.  -+ CKD, last BUN/creatinine:  Lab Results  Component Value Date   BUN 34 (H) 05/12/2021   CREATININE 1.57 (H) 05/12/2021  On benazepril.  Sees nephrology. She had a high potassium (5.9) >> used Kayexalate x3 >> normalized.  She is on a low potassium diet.  -+ HL last set of lipids: Lab Results  Component Value Date   CHOL 116 05/15/2020   HDL 21.90 (L) 05/15/2020   LDLCALC 50 05/30/2019   LDLDIRECT 55.0 05/15/2020   TRIG 207.0 (H) 05/15/2020   CHOLHDL 5 05/15/2020  On Zocor and Zetia.  - last eye exam 03/2021: No DR; she had cataract surgery in 2016.  She has a history of retinal tear.  - no numbness  and tingling in her feet.  She takes Neurontin for leg cramps at night.  She also has CLL, HTN, GERD, depression.  She was admitted 11/2017 for dehydration, anemia, and acute renal failure >> diagnosed with CLL.  ROS: + see HPI  I reviewed pt's medications, allergies, PMH, social hx, family hx, and changes were documented in the history of present illness. Otherwise, unchanged from my initial visit note.  Past Medical History:  Diagnosis Date   Anxiety    Arthritis    Atrial fibrillation (HCC)    CLL (chronic lymphocytic leukemia) ([place])    Dx. 2019 Dr. Irene Limbo'   Depression    Wellbutrin   Diabetes mellitus    Type II   Endometrial ca [facility])    1998   Foot  fracture, left    "non-union fracture that happened 30-40 years ago"   GERD (gastroesophageal reflux disease)    Heart murmur    patient states "cardiologist has not heard heart murmur for past several years"   Heel spur    History of hiatal hernia    small   History of squamous cell carcinoma in situ 02/19/2019   [facility] - Pathology from right hand dorsal mass excision on 4.8.20   Hyperkalemia    Hyperlipidemia    Hypertension    Left leg swelling    "swelling in left leg from knee down when sitting for prolonged periods or being on feet for a long period of time"   PONV (postoperative nausea and vomiting)    after hysterectomy   Renal insufficiency    Stage 3 b   Dr. Particia Nearing first visit 11-2019   Past Surgical History:  Procedure Laterality Date   ABDOMINAL HYSTERECTOMY  1998   COLONOSCOPY     EYE SURGERY Bilateral    cataracts   EYE SURGERY Left    retina tear   FEMUR IM NAIL  10/25/2012   Procedure: INTRAMEDULLARY (IM) NAIL FEMORAL;  Surgeon: Mauri Pole, MD;  Location: [facility];  Service: Orthopedics;  Laterality: Left;   HIP SURGERY     Fracture L IM nail and 3 rods   left knee arthroscopy  12/2010   LYMPH NODE BIOPSY     axillary left 12-2018   REFRACTIVE SURGERY     skin cancer removed right and and lower forearm     squamoun in situ   TOTAL KNEE ARTHROPLASTY  12/20/2011   Procedure: TOTAL KNEE ARTHROPLASTY;  Surgeon: Gearlean Alf, MD;  Location: [facility];  Service: Orthopedics;  Laterality: Left;  Failed attempt at spinal    TOTAL SHOULDER ARTHROPLASTY Right 08/19/2016   Procedure: RIGHT TOTAL SHOULDER ARTHROPLASTY;  Surgeon: Justice Britain, MD;  Location: [facility];  Service: Orthopedics;  Laterality: Right;   TOTAL SHOULDER ARTHROPLASTY Left 01/10/2020   Procedure: TOTAL SHOULDER ARTHROPLASTY;  Surgeon: Justice Britain, MD;  Location: [facility];  Service: Orthopedics;  Laterality: Left;  155mn   Social History   Social History   Marital Status: Single    Spouse  Name: N/A   Number of Children: 0   Occupational History   RN at [employer]    Social History Main Topics   Smoking status: Never Smoker    Smokeless tobacco: Never Used   Alcohol Use: No   Drug Use: No   Social History NAdult nurseat [employer]   Current Outpatient Medications on File Prior to Visit  Medication Sig Dispense Refill   acetaminophen (  TYLENOL) 500 MG tablet Take 500-1,000 mg by mouth every 6 (six) hours as needed for mild pain, moderate pain or fever.      ALPRAZolam (XANAX) 1 MG tablet TAKE 1/2 TO 1 TABLET(0.5 TO 1 MG) BY MOUTH TWICE DAILY AS NEEDED FOR ANXIETY 60 tablet 2   apixaban (ELIQUIS) 5 MG TABS tablet Take 1 tablet (5 mg total) by mouth 2 (two) times daily. 30 tablet 11   BD PEN NEEDLE NANO 2ND GEN 32G X 4 MM MISC USE DAILY AS DIRECTED 100 each 3   Blood Glucose Monitoring Suppl (ONETOUCH VERIO) w/Device KIT Use as advised 1 kit 0   buPROPion (WELLBUTRIN XL) 300 MG 24 hr tablet Take 1 tablet (300 mg total) by mouth daily. 90 tablet 3   calcium-vitamin D (OSCAL WITH D) 500-200 MG-UNIT tablet Take 1 tablet by mouth. With vtamin E (Patient not taking: Reported on 05/12/2021)     Cholecalciferol (VITAMIN D3) 2000 units TABS Take 2,000 mcg by mouth daily.     Cyanocobalamin (B-12) 1000 MCG SUBL Place 1,000 mcg under the tongue every evening.      doxazosin (CARDURA) 2 MG tablet Take 2 mg by mouth daily.     empagliflozin (JARDIANCE) 10 MG TABS tablet Take 1 tablet (10 mg total) by mouth daily before breakfast. 30 tablet 11   esomeprazole (NEXIUM) 20 MG capsule Take 20 mg by mouth daily as needed (acid reflux symptoms).      ezetimibe (ZETIA) 10 MG tablet TAKE 1 TABLET(10 MG) BY MOUTH DAILY 90 tablet 3   glipiZIDE (GLUCOTROL XL) 2.5 MG 24 hr tablet Take 2 tablets in the evening 180 tablet 3   glucose blood (ONETOUCH VERIO) test strip USE TO CHECK BLOOD SUGAR THREE TIMES DAILY 300 strip 1   insulin glargine (LANTUS SOLOSTAR) 100 UNIT/ML Solostar Pen Inject 24  Units into the skin daily.     iron polysaccharides (NIFEREX) 150 MG capsule Take 150 mg by mouth at bedtime.      Lancet Devices (LANCING DEVICE) MISC Use as advised - Freestyle 1 each 0   lisinopril (ZESTRIL) 20 MG tablet Take 1 tablet (20 mg total) by mouth daily. 90 tablet 1   metoprolol succinate (TOPROL-XL) 50 MG 24 hr tablet TAKE 1 TABLET(50 MG) BY MOUTH TWICE DAILY AS DIRECTED 180 tablet 0   NONFORMULARY OR COMPOUNDED ITEM Apply 1 application topically 2 (two) times daily as needed (sun damage on face). FLUOROURACIL 5% + CALCIPOTRIENE 0.005%     ondansetron (ZOFRAN) 4 MG tablet Take 1 tablet (4 mg total) by mouth every 8 (eight) hours as needed for nausea or vomiting. (Patient not taking: Reported on 05/12/2021) 20 tablet 0   simvastatin (ZOCOR) 40 MG tablet TAKE 1 TABLET(40 MG) BY MOUTH AT BEDTIME 90 tablet 3   triamcinolone cream (KENALOG) 0.1 % Apply 1 application topically 2 (two) times daily as needed (skin rash.).      No current facility-administered medications on file prior to visit.   Allergies  Allergen Reactions   Amlodipine     Swelling     Contrast Media [Iodinated Diagnostic Agents] Other (See Comments)    Patient states unable to take / use due to kidney function   Nsaids     Due to kidney function   Family History  Problem Relation Age of Onset   Cancer Mother        breast cancer   Cancer Father        plasma sarcoma  Breast cancer Neg Hx    PE: BP 108/62 (BP Location: Right Arm, Patient Position: Sitting, Cuff Size: Normal)   Pulse 75   Ht 5' (1.524 m)   Wt 189 lb (85.7 kg)   SpO2 90%   BMI 36.91 kg/m  Body mass index is 36.91 kg/m. Wt Readings from Last 3 Encounters:  07/17/21 189 lb (85.7 kg)  05/12/21 189 lb (85.7 kg)  04/14/21 184 lb 8 oz (83.7 kg)   Constitutional: overweight, in NAD Eyes: PERRLA, EOMI, no exophthalmos ENT: moist mucous membranes, no thyromegaly, no cervical lymphadenopathy Cardiovascular: RRR, No MRG, + LLE  edema-chronic Respiratory: CTA B Gastrointestinal: abdomen soft, NT, ND, BS+ Musculoskeletal: no deformities, strength intact in all 4 Skin: moist, warm, no rashes Neurological: no tremor with outstretched hands, DTR normal in all 4  ASSESSMENT: 1. DM2, insulin-dependent, uncontrolled, with complications  - mild CKD  2. Obesity class 2  3. HL  PLAN:  1. Patient with longstanding, uncontrolled diabetes, with much improved control since the summer 2020, after addition of insulin.  At that time, metformin was stopped due to worsening kidney function.  She was on Victoza in the past but stopped due to nausea.  She was reticent to try another GLP-1 receptor agonist afterwards. -At last visit, she returned after starting Vania Rea (Farxiga was not covered).  She continues on this medication with good tolerance.  However, it is expensive especially now that she is in the donut hole.  She tried to go to the patient assistance but she did not qualify. -At today's visit, sugars are much better controlled in the last month, with only occasional sugars above target, but without significant hyperglycemic spikes.  She does have lower blood sugars midday, though, with the lowest being 68 so I advised her to reduce the dose of glipizide before dinner.  I advised her to let me know if the sugars are still running back case we can either switch to instant release glipizide or stop glipizide completely, depending on the blood sugars.  We will continue the rest of the regimen for now. - I suggested to:  Patient Instructions  Please continue: - Lantus 24 units after dinner - Jardiance 10 mg before b'fast   Please decrease: - Glipizide XR 2.5 mg before dinner  Please return in 4 months with your sugar log.  - we checked her HbA1c: 6.5% (better) - advised to check sugars at different times of the day - 1x a day, rotating check times - advised for yearly eye exams >> she is UTD - return to clinic in 3-4  months     2. Obesity class 2 -She has problems eating the right foods, states she also has to limit potassium -We will continue Jardiance which should also help with weight loss -She gained 9 pounds before last visit and gained 5 pounds since then  3.  Hyperlipidemia -Reviewed latest lipid panel from 05/2020: LDL at goal, triglycerides high, HDL low: Lab Results  Component Value Date   CHOL 116 05/15/2020   HDL 21.90 (L) 05/15/2020   LDLCALC 50 05/30/2019   LDLDIRECT 55.0 05/15/2020   TRIG 207.0 (H) 05/15/2020   CHOLHDL 5 05/15/2020  -We will continue Zocor 40 mg daily and Zetia 10 mg daily- no SEs -She is due for another lipid panel -has an appointment with cardiology in 3 days and would like to wait until then  Philemon Kingdom, MD PhD [facility] Endocrinology

## 2021-07-19 NOTE — Progress Notes (Signed)
Patient ID: Brenda [name], female   DOB: 1951/03/17, 70 y.o.   MRN: [id number] Cardiology Office Note  Date:  07/20/2021   ID:  Brenda [name], Brenda [name] [birth year], MRN [id number]  PCP:  [name], MD   Chief Complaint  Patient presents with   3 month follow up     Patient c/o shortness of breath & LE edema. Patient took a fall and is sore all over.Medications reviewed by the patient verbally.     HPI:  Brenda [name] is a  70 -year-old woman with history of coronary artery disease, CT coronary calcium score 1031 obesity, diabetes,  poorly controlled, hemoglobin A1c previously of 12 , Now 6 hyperlipidemia,  with previous stress test and echocardiogram for abnormal EKG,  CLL s/p shoulder replacement surgery who presents for routine followup of her coronary artery disease and hyperlipidemia.   03/12/21 and reported elevated heart rate. She was diagnosed with new afib. Eliquis 72m BID was started and metoprolol 546mBID. ASA was stopped  taking Toprol 5031mID. Cardura previously stopped to allow for titration of BB. However, noted high Bps in the last few weeks so she re-started Cardura 2mg64mily   Last Friday, fell off stool, Injured toes, arm, back "I have broken ribs before"  Takes all BP meds in the AM Metoprolol/lisinopril/cardura  CBC : HGB 12.5 CR 1.5 A1C 6.5  Heart rate at home 50 to 60 bpm  EKG personally reviewed by myself on todays visit NSR rate 68 bpm no ST or T wave changes  Other past medical hx reviewed In hospital 06/2020,   fall, fractured ribs, dehydration, acute renal failure  weakness and dizziness  acute kidney injury and has a serum creatinine of 4.63 compared to baseline of 1.67. Fever of 101.  So far cultures are negative.  Stool for C. difficile negative.  Patient on Rocephin and Flagyl Treated with midodrine  follow-up with Dr. UptoHollie Salkm [place]  PT recommended home with home services 24-hour supervision Was very SOB for  weeks after she got home   hospital 11/2017 Dehydrated Lung nodules, enlarged lymph nodes Anemic  HBG 6.6 after hydration, iron def, stool negative BP was low. 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  PSH:    Past Surgical History:  Procedure Laterality Date   ABDOMINAL HYSTERECTOMY  1998   COLONOSCOPY     EYE SURGERY Bilateral    cataracts   EYE SURGERY Left    retina tear   FEMUR IM NAIL  10/25/2012   Procedure: INTRAMEDULLARY (IM) NAIL FEMORAL;  Surgeon: MattMauri Pole;  Location: [facility];  Service: Orthopedics;  Laterality: Left;   HIP SURGERY     Fracture L IM nail and 3 rods   left knee arthroscopy  12/2010   LYMPH NODE BIOPSY     axillary left 12-2018   REFRACTIVE SURGERY     skin cancer removed right and and lower forearm     squamoun in situ   TOTAL KNEE ARTHROPLASTY  12/20/2011   Procedure: TOTAL  KNEE ARTHROPLASTY;  Surgeon: Gearlean Alf, MD;  Location: [facility];  Service: Orthopedics;  Laterality: Left;  Failed attempt at spinal    TOTAL SHOULDER ARTHROPLASTY Right 08/19/2016   Procedure: RIGHT TOTAL SHOULDER ARTHROPLASTY;  Surgeon: Justice Britain, MD;  Location: [employer];  Service: Orthopedics;  Laterality: Right;   TOTAL SHOULDER ARTHROPLASTY Left 01/10/2020   Procedure: TOTAL SHOULDER ARTHROPLASTY;  Surgeon: Justice Britain, MD;  Location: [facility];  Service: Orthopedics;  Laterality: Left;  139mn    Current  Outpatient Medications  Medication Sig Dispense Refill   acetaminophen (TYLENOL) 500 MG tablet Take 500-1,000 mg by mouth every 6 (six) hours as needed for mild pain, moderate pain or fever.      ALPRAZolam (XANAX) 1 MG tablet TAKE 1/2 TO 1 TABLET(0.5 TO 1 MG) BY MOUTH TWICE DAILY AS NEEDED FOR ANXIETY 60 tablet 2   apixaban (ELIQUIS) 5 MG TABS tablet Take 1 tablet (5 mg total) by mouth 2 (two) times daily. 30 tablet 11   BD PEN NEEDLE NANO 2ND GEN 32G X 4 MM MISC USE DAILY AS DIRECTED 100 each 3   Blood Glucose Monitoring Suppl (ONETOUCH VERIO) w/Device KIT Use as advised 1 kit 0   buPROPion (WELLBUTRIN XL) 300 MG 24 hr tablet Take 1 tablet (300 mg total) by mouth daily. 90 tablet 3   Cholecalciferol (VITAMIN D3) 2000 units TABS Take 2,000 mcg by mouth daily.     Cyanocobalamin (B-12) 1000 MCG SUBL Place 1,000 mcg under the tongue every evening.      doxazosin (CARDURA) 2 MG tablet Take 2 mg by mouth daily.     empagliflozin (JARDIANCE) 10 MG TABS tablet Take 1 tablet (10 mg total) by mouth daily before breakfast. 30 tablet 11   esomeprazole (NEXIUM) 20 MG capsule Take 20 mg by mouth daily as needed (acid reflux symptoms).      ezetimibe (ZETIA) 10 MG tablet TAKE 1 TABLET(10 MG) BY MOUTH DAILY 90 tablet 3   glipiZIDE (GLUCOTROL XL) 2.5 MG 24 hr tablet Take 1 tablet in the evening 90 tablet 3   glucose blood (ONETOUCH VERIO) test strip USE TO CHECK BLOOD SUGAR THREE TIMES DAILY 300 strip 1   insulin glargine (LANTUS SOLOSTAR) 100 UNIT/ML Solostar Pen Inject 24 Units into the skin daily.     iron polysaccharides (NIFEREX) 150 MG capsule Take 150 mg by mouth at bedtime.      Lancet Devices (LANCING DEVICE) MISC Use as advised - Freestyle 1 each 0   lisinopril (ZESTRIL) 20 MG tablet Take 1 tablet (20 mg total) by mouth daily. 90 tablet 1   Metoprolol Succinate 100 MG CS24 Take 100 mg by mouth daily.     NONFORMULARY OR COMPOUNDED ITEM Apply 1 application topically 2 (two) times daily as needed (sun  damage on face). FLUOROURACIL 5% + CALCIPOTRIENE 0.005%     simvastatin (ZOCOR) 40 MG tablet TAKE 1 TABLET(40 MG) BY MOUTH AT BEDTIME 90 tablet 3   triamcinolone cream (KENALOG) 0.1 % Apply 1 application topically 2 (two) times daily as needed (skin rash.).      No current facility-administered medications for this visit.    Allergies:   Amlodipine, Contrast media [iodinated diagnostic agents], and Nsaids   Social History:  The patient  reports that she has never smoked. She has never used smokeless tobacco. She reports that she does not drink alcohol and does not use drugs.   Family History:   family history includes Cancer in her father  and mother.    Review of Systems: Review of Systems  Constitutional: Negative.   HENT: Negative.    Respiratory: Negative.    Cardiovascular: Negative.   Gastrointestinal: Negative.   Musculoskeletal: Negative.        Bruising  Neurological: Negative.   Psychiatric/Behavioral: Negative.  Suicidal ideas: .   All other systems reviewed and are negative.   PHYSICAL EXAM: VS:  BP 130/60 (BP Location: Left Arm, Patient Position: Sitting, Cuff Size: Normal)   Pulse 68   Ht 5' (1.524 m)   Wt 188 lb 4 oz (85.4 kg)   SpO2 97%   BMI 36.77 kg/m  , BMI Body mass index is 36.77 kg/m. Constitutional:  oriented to person, place, and time. No distress.  HENT:  Head: Grossly normal Eyes:  no discharge. No scleral icterus.  Neck: No JVD, no carotid bruits  Cardiovascular: Regular rate and rhythm, no murmurs appreciated Pulmonary/Chest: Clear to auscultation bilaterally, no wheezes or rails Abdominal: Soft.  no distension.  no tenderness.  Musculoskeletal: Normal range of motion Neurological:  normal muscle tone. Coordination normal. No atrophy Skin: Skin warm and dry Psychiatric: normal affect, pleasant  Recent Labs: 04/14/2021: TSH 2.640 05/12/2021: ALT 11; BUN 34; Creatinine 1.57; Hemoglobin 12.5; Platelets 145; Potassium 5.1; Sodium 141     Lipid Panel Lab Results  Component Value Date   CHOL 116 05/15/2020   HDL 21.90 (L) 05/15/2020   LDLCALC 50 05/30/2019   TRIG 207.0 (H) 05/15/2020      Wt Readings from Last 3 Encounters:  07/20/21 188 lb 4 oz (85.4 kg)  07/17/21 189 lb (85.7 kg)  05/12/21 189 lb (85.7 kg)     ASSESSMENT AND PLAN:  Essential hypertension -  Blood pressure is well controlled on today's visit. No changes made to the medications. Metoprolol succinate 50 BID, Lisinopril 20 mg in AM, cardura 2 mg in pm  Atrial fibrillation with RVR Start eliquis 5 BID Metoprolol succinate 50 twice a day  Type 2 diabetes mellitus with hyperglycemia, without long-term current use of insulin ([place]) Follows with endocrine A1C controlled  morbid obesity We have encouraged continued exercise, careful diet management in an effort to lose weight.  HLD (hyperlipidemia) Tolerating simvastatin, zetia Cholesterol is at goal on the current lipid regimen. No changes to the medications were made.  Anemia History of iron deficiency, followed by primary care Hemoglobin stable  Chronic renal failure Followed by nephrology CR high end of range, 1.5   Total encounter time more than 25 minutes  Greater than 50% was spent in counseling and coordination of care with the patient   Orders Placed This Encounter  Procedures   EKG 12-Lead   Signed, Esmond Plants, M.D., Ph.D. 07/20/2021  [place], [place]

## 2021-07-20 ENCOUNTER — Encounter: Payer: Self-pay | Admitting: Cardiovascular Disease

## 2021-07-20 ENCOUNTER — Ambulatory Visit (INDEPENDENT_AMBULATORY_CARE_PROVIDER_SITE_OTHER): Payer: Medicare Other | Admitting: Cardiovascular Disease

## 2021-07-20 ENCOUNTER — Other Ambulatory Visit: Payer: Self-pay

## 2021-07-20 VITALS — BP 130/60 | HR 68 | Ht 60.0 in | Wt 188.2 lb

## 2021-07-20 DIAGNOSIS — C9110 Chronic lymphocytic leukemia of B-cell type not having achieved remission: Secondary | ICD-10-CM | POA: Diagnosis not present

## 2021-07-20 DIAGNOSIS — I10 Essential (primary) hypertension: Secondary | ICD-10-CM | POA: Diagnosis not present

## 2021-07-20 DIAGNOSIS — E1165 Type 2 diabetes mellitus with hyperglycemia: Secondary | ICD-10-CM

## 2021-07-20 DIAGNOSIS — I25118 Atherosclerotic heart disease of native coronary artery with other forms of angina pectoris: Secondary | ICD-10-CM

## 2021-07-20 DIAGNOSIS — E782 Mixed hyperlipidemia: Secondary | ICD-10-CM | POA: Diagnosis not present

## 2021-07-20 DIAGNOSIS — I4891 Unspecified atrial fibrillation: Secondary | ICD-10-CM | POA: Diagnosis not present

## 2021-07-20 MED ORDER — METOPROLOL SUCCINATE 100 MG PO CS24: 50.0000 mg | EXTENDED_RELEASE_CAPSULE | Freq: Two times a day (BID) | ORAL | 11 refills | Status: DC

## 2021-07-20 MED ORDER — DOXAZOSIN MESYLATE 2 MG PO TABS: 2.0000 mg | ORAL_TABLET | Freq: Every evening | ORAL | 3 refills | Status: DC

## 2021-07-20 MED ORDER — LISINOPRIL 20 MG PO TABS
20.0000 mg | ORAL_TABLET | Freq: Every morning | ORAL | 3 refills | Status: DC
Start: 2021-07-20 — End: 2021-10-16

## 2021-07-20 NOTE — Patient Instructions (Addendum)
Medication Instructions:   Please START metoprolol succinate 50 mg twice a day  Lisinopril 20 mg in the morning Cardura 2 mg in evening  If you need a refill on your cardiac medications before your next appointment, please call your pharmacy.   Lab work: Lipids, LFTs Results will appear on MyChart, we will call for ABNORMAL   Testing/Procedures: No new testing needed  Follow-Up: At [facility], you and your health needs are our priority.  As part of our continuing mission to provide you with exceptional heart care, we have created designated Provider Care Teams.  These Care Teams include your primary Cardiologist (physician) and Advanced Practice Providers (APPs -  Physician Assistants and Nurse Practitioners) who all work together to provide you with the care you need, when you need it.  You will need a follow up appointment in 6 months  Providers on your designated Care Team:   Murray Hodgkins, NP Christell Faith, PA-C Marrianne Mood, PA-C [place], [place]  COVID-19 Vaccine Information can be found at: [email or web address].uk For questions related to vaccine distribution or appointments, please email [email or web address] or call [phone].

## 2021-07-21 LAB — LIPID PANEL
Chol/HDL Ratio: 4.3 ratio (ref 0.0–4.4)
Cholesterol, Total: 124 mg/dL (ref 100–199)
HDL: 29 mg/dL — ABNORMAL LOW (ref >39)
LDL Chol Calc (NIH): 73 mg/dL (ref 0–99)
Triglycerides: 122 mg/dL (ref 0–149)
VLDL Cholesterol Cal: 22 mg/dL (ref 5–40)

## 2021-07-21 LAB — HEPATIC FUNCTION PANEL
ALT: 13 IU/L (ref 0–32)
AST: 14 IU/L (ref 0–40)
Albumin: 4.4 g/dL (ref 3.7–4.7)
Alkaline Phosphatase: 94 IU/L (ref 44–121)
Bilirubin Total: 0.5 mg/dL (ref 0.0–1.2)
Bilirubin, Direct: 0.17 mg/dL (ref 0.00–0.40)
Total Protein: 6 g/dL (ref 6.0–8.5)

## 2021-07-31 ENCOUNTER — Encounter: Payer: Self-pay | Admitting: Internal Medicine

## 2021-08-04 DIAGNOSIS — Z23 Encounter for immunization: Secondary | ICD-10-CM | POA: Diagnosis not present

## 2021-08-05 ENCOUNTER — Other Ambulatory Visit: Payer: Self-pay | Admitting: Internal Medicine

## 2021-08-12 ENCOUNTER — Ambulatory Visit (INDEPENDENT_AMBULATORY_CARE_PROVIDER_SITE_OTHER): Payer: Medicare Other

## 2021-08-12 DIAGNOSIS — I10 Essential (primary) hypertension: Secondary | ICD-10-CM

## 2021-08-12 DIAGNOSIS — I480 Paroxysmal atrial fibrillation: Secondary | ICD-10-CM

## 2021-08-12 NOTE — Chronic Care Management (AMB) (Signed)
Chronic Care Management   CCM RN Visit Note  08/12/2021 Name: Brenda [name] MRN: [id number] DOB: 1950-12-31  Subjective: Brenda [name] is a 70 y.o. year old female who is a primary care patient of [name], MD. The care management team was consulted for assistance with disease management and care coordination needs.    Engaged with patient by telephone for follow up visit in response to provider referral for case management and/or care coordination services.   Consent to Services:  The patient was given information about Chronic Care Management services, agreed to services, and gave verbal consent prior to initiation of services.  Please see initial visit note for detailed documentation.   Patient agreed to services and verbal consent obtained.   Assessment: Review of patient past medical history, allergies, medications, health status, including review of consultants reports, laboratory and other test data, was performed as part of comprehensive evaluation and provision of chronic care management services.   SDOH (Social Determinants of Health) assessments and interventions performed:    CCM Care Plan  Allergies  Allergen Reactions   Amlodipine     Swelling     Contrast Media [Iodinated Diagnostic Agents] Other (See Comments)    Patient states unable to take / use due to kidney function   Nsaids     Due to kidney function    Outpatient Encounter Medications as of 08/12/2021  Medication Sig   acetaminophen (TYLENOL) 500 MG tablet Take 500-1,000 mg by mouth every 6 (six) hours as needed for mild pain, moderate pain or fever.    ALPRAZolam (XANAX) 1 MG tablet TAKE 1/2 TO 1 TABLET(0.5 TO 1 MG) BY MOUTH TWICE DAILY AS NEEDED FOR ANXIETY   apixaban (ELIQUIS) 5 MG TABS tablet Take 1 tablet (5 mg total) by mouth 2 (two) times daily.   BD PEN NEEDLE NANO 2ND GEN 32G X 4 MM MISC USE DAILY AS DIRECTED   Blood Glucose Monitoring Suppl (ONETOUCH VERIO) w/Device KIT Use  as advised   buPROPion (WELLBUTRIN XL) 300 MG 24 hr tablet Take 1 tablet (300 mg total) by mouth daily.   Cholecalciferol (VITAMIN D3) 2000 units TABS Take 2,000 mcg by mouth daily.   Cyanocobalamin (B-12) 1000 MCG SUBL Place 1,000 mcg under the tongue every evening.    doxazosin (CARDURA) 2 MG tablet Take 1 tablet (2 mg total) by mouth every evening.   empagliflozin (JARDIANCE) 10 MG TABS tablet Take 1 tablet (10 mg total) by mouth daily before breakfast.   esomeprazole (NEXIUM) 20 MG capsule Take 20 mg by mouth daily as needed (acid reflux symptoms).    ezetimibe (ZETIA) 10 MG tablet TAKE 1 TABLET(10 MG) BY MOUTH DAILY   glipiZIDE (GLUCOTROL XL) 2.5 MG 24 hr tablet Take 1 tablet in the evening   glucose blood (ONETOUCH VERIO) test strip USE TO CHECK BLOOD SUGAR THREE TIMES DAILY   insulin glargine (LANTUS SOLOSTAR) 100 UNIT/ML Solostar Pen Inject 24 Units into the skin daily.   iron polysaccharides (NIFEREX) 150 MG capsule Take 150 mg by mouth at bedtime.    Lancet Devices (LANCING DEVICE) MISC Use as advised - Freestyle   lisinopril (ZESTRIL) 20 MG tablet Take 1 tablet (20 mg total) by mouth in the morning.   Metoprolol Succinate 100 MG CS24 Take 50 mg by mouth 2 (two) times daily.   NONFORMULARY OR COMPOUNDED ITEM Apply 1 application topically 2 (two) times daily as needed (sun damage on face). FLUOROURACIL 5% + CALCIPOTRIENE 0.005%  simvastatin (ZOCOR) 40 MG tablet TAKE 1 TABLET(40 MG) BY MOUTH AT BEDTIME   triamcinolone cream (KENALOG) 0.1 % Apply 1 application topically 2 (two) times daily as needed (skin rash.).    No facility-administered encounter medications on file as of 08/12/2021.    Patient Active Problem List   Diagnosis Date Noted   Fever    Acute respiratory failure with hypoxia ([place])    Multiple closed fractures of ribs of left side    Appendicitis    AKI (acute kidney injury) ([place]) 06/22/2020   Fall at home, initial encounter 06/22/2020   Osteopenia 05/22/2020    Status post total shoulder arthroplasty, left 01/10/2020   Well woman exam without gynecological exam 05/02/2019   SCC (squamous cell carcinoma), hand, right 05/02/2019   Advice given about COVID-19 virus infection 05/02/2019   CKD (chronic kidney disease) stage 3, GFR 30-59 ml/min (HCC) 07/05/2018   CLL (chronic lymphocytic leukemia) ([place]) 03/13/2018   Anxiety 03/13/2018   Bilateral edema of lower extremity 01/18/2018   Pleural effusion, bilateral 12/19/2017   Acute on chronic renal failure ([place]) 11/28/2017   Hypokalemia 11/28/2017   Hypotension 11/18/2017   History of endometrial cancer 11/18/2017   Pelvic lymphadenopathy 11/18/2017   Microcytic anemia 11/18/2017   Daytime sleepiness 08/22/2017   Medication side effect, initial encounter 08/22/2017   Coronary artery disease of native artery of native heart with stable angina pectoris ([place]) 04/03/2017   S/P shoulder replacement, right 08/19/2016   Type 2 diabetes mellitus with hyperglycemia, without long-term current use of insulin ([place]) 10/15/2015   Obesity 01/24/2015   Generalized anxiety disorder 01/02/2015   Chronic radicular low back pain 04/01/2014   Tachycardia 01/31/2013   Hip fracture, left ([place]) 10/24/2012   Postop Hyponatremia 12/21/2011   OA (osteoarthritis) 09/20/2011   Hyperlipidemia 11/06/2010   Depression with anxiety 11/06/2010   Essential hypertension 11/06/2010   GERD 11/06/2010    Conditions to be addressed/monitored:Atrial Fibrillation and HTN  Care Plan : Cardiovascular  Updates made by Dannielle Karvonen, RN since 08/12/2021 12:00 AM     Problem: Minimize/ prevent disease progression ( Atrial fibrillation/ hypertension/ diabetes)   Priority: High     Long-Range Goal: Monitoring and management of Atrial fibrillation and hypertension   Start Date: 05/20/2021  Expected End Date: 10/30/2021  This Visit's Progress: On track  Recent Progress: On track  Priority: Medium  Note:   Current Barriers:   Knowledge deficits related to long term care plan for self health management of health conditions: Atrial fibrillation, hypertension, diabetes  Patient states she is doing well. She reports have recent follow up with her cardiologist, endocrinologist and nephrologist.  Patient states her cardiologist adjusted the time she is to take one of her blood pressure medications.  She reports recent blood pressure readings:  122/60, 130/61, 129/66, 124/63.  She reports her pulse runs in the 60's.  Patient reports her endocrinologist decreased her glipizide dosage due to occasional midday lows.  She states her morning blood sugars started trending up in the 130's to 150's.  She states she notified the doctor and she was placed back on her original dose of glipizide.  She will report any ongoing low blood sugars as needed.  Patient reports having 2 falls since last outreach with RNCM.  She reports one fall was due to being stung by wasps near her mailbox and the other she leaned over to far in her bar stool chair and tumbled out.  Patient states she sustained bruised ribs  and some abrasions and injury to her toe.  She states all has healed and denies any further concerns. Patient states she did not qualify for medication assistance but her two medications of concern has gone to a different tier level with her insurance, therefore now more affordable.    Case Manager Clinical Goal(s):  patient will take medications  as prescribed and will call provider for medication related questions patient will verbalize understanding of Afib Action Plan and when to call doctor patient will attend  scheduled medical appointments the patient will demonstrate ongoing self health care management ability as evidenced by monitoring and recording blood pressure and pulse daily and reporting any abnormal values to provider Patient will work with practice pharmacist regarding medication assistance.  Interventions:  Collaboration with  Ronnald Nian, DO regarding development and update of comprehensive plan of care as evidenced by provider attestation and co-signature Inter-disciplinary care team collaboration (see longitudinal plan of care) Basic overview and discussion of afib disease state Discussed Atrial fibrillation action plan and provided to patient in writing.  Evaluation of current treatment plan related to Atrial fibrillation and hypertension and patient's adherence to plan as established by provider. Reviewed medications with patient and discussed Advised patient, providing education and rationale, to monitor blood pressure and pulse rate daily and record, calling providing with  findings outside established parameters.  Reviewed scheduled/upcoming provider appointments:  Patient will follow with new primary care provider, Dr. Pricilla Holm October 16, 2021. Patient Goals: - Continue to check pulse (heart) rate and blood pressure daily and record - Plan to eat a heart healthy, low salt diet -  Keep follow up appointments with your provider -  Continue to take your medications as prescribed and refill timely Follow Up Plan: The patient has been provided with contact information for the care management team and has been advised to call with any health related questions or concerns.  The care management team will reach out to the patient again over the next 1-2 months         Plan:The patient has been provided with contact information for the care management team and has been advised to call with any health related questions or concerns.  and The care management team will reach out to the patient again over the next 1-2 months  Quinn Plowman RN,BSN,CCM RN Case Manager [employer] [phone]

## 2021-08-12 NOTE — Patient Instructions (Signed)
Visit Information:  Thank you for taking the time to speak with me today.   PATIENT GOALS:  Goals Addressed             This Visit's Progress    Track and Manage Heart Rate and Rhythm-Atrial Fibrillation and Blood pressure   On track    Timeframe:  Long-Range Goal Priority:  High Start Date:    05/20/2021                         Expected End Date:   10/30/2021                   Follow Up Date 10/14/2021 - Continue to check pulse (heart) rate and blood pressure daily and record - Plan to eat a heart healthy, low salt diet -  Keep follow up appointments with your provider -  Continue to take your medications as prescribed and refill timely     Why is this important?   Atrial fibrillation may have no symptoms. Sometimes the symptoms get worse or happen more often.  It is important to keep track of what your symptoms are and when they happen.  A change in symptoms is important to discuss with your doctor or nurse.  Being active and healthy eating will also help you manage your heart condition.             Patient verbalizes understanding of instructions provided today and agrees to view in [place].   The patient has been provided with contact information for the care management team and has been advised to call with any health related questions or concerns.  The care management team will reach out to the patient again over the next 1-2 months   Quinn Plowman RN,BSN,CCM RN Case Manager [place] [phone]

## 2021-08-20 ENCOUNTER — Other Ambulatory Visit: Payer: Self-pay | Admitting: Internal Medicine

## 2021-08-20 DIAGNOSIS — Z1231 Encounter for screening mammogram for malignant neoplasm of breast: Secondary | ICD-10-CM

## 2021-08-25 DIAGNOSIS — D1801 Hemangioma of skin and subcutaneous tissue: Secondary | ICD-10-CM | POA: Diagnosis not present

## 2021-08-25 DIAGNOSIS — Z85828 Personal history of other malignant neoplasm of skin: Secondary | ICD-10-CM | POA: Diagnosis not present

## 2021-08-25 DIAGNOSIS — L570 Actinic keratosis: Secondary | ICD-10-CM | POA: Diagnosis not present

## 2021-08-25 DIAGNOSIS — L578 Other skin changes due to chronic exposure to nonionizing radiation: Secondary | ICD-10-CM | POA: Diagnosis not present

## 2021-08-25 DIAGNOSIS — D225 Melanocytic nevi of trunk: Secondary | ICD-10-CM | POA: Diagnosis not present

## 2021-08-31 DIAGNOSIS — I480 Paroxysmal atrial fibrillation: Secondary | ICD-10-CM | POA: Diagnosis not present

## 2021-08-31 DIAGNOSIS — I10 Essential (primary) hypertension: Secondary | ICD-10-CM | POA: Diagnosis not present

## 2021-09-03 ENCOUNTER — Telehealth: Payer: Self-pay | Admitting: Cardiovascular Disease

## 2021-09-03 ENCOUNTER — Other Ambulatory Visit: Payer: Self-pay | Admitting: Cardiovascular Disease

## 2021-09-03 NOTE — Telephone Encounter (Signed)
Refill request

## 2021-09-03 NOTE — Telephone Encounter (Signed)
Lmovm to verify issue with Eliquis.

## 2021-09-03 NOTE — Telephone Encounter (Signed)
Please call to discuss denial of Eliquis.

## 2021-09-03 NOTE — Telephone Encounter (Signed)
Prescription refill request for Eliquis received. Indication: Atrial fib Last office visit: 07/20/21  Johnny Bridge MD Scr: 1.57 on 05/12/21 Age: 70 Weight: 85.4kg  Based on above findings Eliquis 87m twice daily is the appropriate dose.  Refill approved.

## 2021-09-04 NOTE — Telephone Encounter (Signed)
Lmovm no answer.

## 2021-09-28 ENCOUNTER — Other Ambulatory Visit: Payer: Self-pay

## 2021-09-28 ENCOUNTER — Ambulatory Visit
Admission: RE | Admit: 2021-09-28 | Discharge: 2021-09-28 | Disposition: A | Discharge status: Home / Self Care | Payer: Medicare Other | Source: Ambulatory Visit

## 2021-09-28 DIAGNOSIS — Z1231 Encounter for screening mammogram for malignant neoplasm of breast: Secondary | ICD-10-CM

## 2021-09-28 NOTE — Telephone Encounter (Signed)
Refill requet for: Bupropion XL 300 mg  LR  10/09/20, #90, 3 rfs LOV  07/02/20 FOV  10/16/21   Please review and advise.  Thanks.  Dm/cma

## 2021-09-29 MED ORDER — BUPROPION HCL ER (XL) 300 MG PO TB24: 300.0000 mg | ORAL_TABLET | Freq: Every day | ORAL | 0 refills | Status: DC

## 2021-09-29 NOTE — Telephone Encounter (Signed)
I have never seen patient so I cannot do any medications, she should contact current PCP's office for fill.

## 2021-10-14 ENCOUNTER — Telehealth: Payer: Medicare Other

## 2021-10-16 ENCOUNTER — Other Ambulatory Visit: Payer: Self-pay

## 2021-10-16 ENCOUNTER — Ambulatory Visit (INDEPENDENT_AMBULATORY_CARE_PROVIDER_SITE_OTHER): Payer: Medicare Other | Admitting: Internal Medicine

## 2021-10-16 ENCOUNTER — Encounter: Payer: Self-pay | Admitting: Internal Medicine

## 2021-10-16 VITALS — BP 124/84 | HR 67 | Resp 18 | Ht 60.0 in | Wt 190.6 lb

## 2021-10-16 DIAGNOSIS — I10 Essential (primary) hypertension: Secondary | ICD-10-CM | POA: Diagnosis not present

## 2021-10-16 DIAGNOSIS — N1832 Chronic kidney disease, stage 3b: Secondary | ICD-10-CM

## 2021-10-16 DIAGNOSIS — C9110 Chronic lymphocytic leukemia of B-cell type not having achieved remission: Secondary | ICD-10-CM

## 2021-10-16 DIAGNOSIS — I25118 Atherosclerotic heart disease of native coronary artery with other forms of angina pectoris: Secondary | ICD-10-CM

## 2021-10-16 DIAGNOSIS — F419 Anxiety disorder, unspecified: Secondary | ICD-10-CM | POA: Diagnosis not present

## 2021-10-16 DIAGNOSIS — M5416 Radiculopathy, lumbar region: Secondary | ICD-10-CM

## 2021-10-16 DIAGNOSIS — Z8542 Personal history of malignant neoplasm of other parts of uterus: Secondary | ICD-10-CM | POA: Diagnosis not present

## 2021-10-16 DIAGNOSIS — E1169 Type 2 diabetes mellitus with other specified complication: Secondary | ICD-10-CM

## 2021-10-16 DIAGNOSIS — E118 Type 2 diabetes mellitus with unspecified complications: Secondary | ICD-10-CM

## 2021-10-16 DIAGNOSIS — E785 Hyperlipidemia, unspecified: Secondary | ICD-10-CM

## 2021-10-16 DIAGNOSIS — M19011 Primary osteoarthritis, right shoulder: Secondary | ICD-10-CM | POA: Diagnosis not present

## 2021-10-16 DIAGNOSIS — M8580 Other specified disorders of bone density and structure, unspecified site: Secondary | ICD-10-CM

## 2021-10-16 DIAGNOSIS — F418 Other specified anxiety disorders: Secondary | ICD-10-CM | POA: Diagnosis not present

## 2021-10-16 DIAGNOSIS — G8929 Other chronic pain: Secondary | ICD-10-CM

## 2021-10-16 MED ORDER — ALPRAZOLAM 1 MG PO TABS: ORAL_TABLET | ORAL | 2 refills | Status: DC

## 2021-10-16 MED ORDER — LISINOPRIL 20 MG PO TABS: 20.0000 mg | ORAL_TABLET | Freq: Every morning | ORAL | 3 refills | Status: DC

## 2021-10-16 MED ORDER — BUPROPION HCL ER (XL) 300 MG PO TB24: 300.0000 mg | ORAL_TABLET | Freq: Every day | ORAL | 3 refills | Status: DC

## 2021-10-16 NOTE — Patient Instructions (Addendum)
We have sent in the refills today.

## 2021-10-16 NOTE — Assessment & Plan Note (Signed)
S/P surgery 2013 and not recurrent to her knowledge at this time.

## 2021-10-16 NOTE — Assessment & Plan Note (Signed)
Seeing oncology and overall stable. Upcoming visit in January. Reviewed labs and notes.

## 2021-10-16 NOTE — Progress Notes (Signed)
° °  Subjective:   Patient ID: Brenda [name], female    DOB: 01-25-1951, 70 y.o.   MRN: [id number]  HPI The patient is a 70 YO female coming in for [facility] with multiple medical problems.   PMH, [facility], social history reviewed and updated  Review of Systems  Constitutional:  Positive for activity change.  HENT: Negative.    Eyes: Negative.   Respiratory:  Negative for cough, chest tightness and shortness of breath.   Cardiovascular:  Negative for chest pain, palpitations and leg swelling.  Gastrointestinal:  Negative for abdominal distention, abdominal pain, constipation, diarrhea, nausea and vomiting.  Musculoskeletal:  Positive for arthralgias and myalgias.  Skin: Negative.   Neurological: Negative.   Psychiatric/Behavioral:  Positive for dysphoric mood and sleep disturbance. The patient is nervous/anxious.    Objective:  Physical Exam Constitutional:      Appearance: She is well-developed. She is obese.  HENT:     Head: Normocephalic and atraumatic.  Cardiovascular:     Rate and Rhythm: Normal rate and regular rhythm.  Pulmonary:     Effort: Pulmonary effort is normal. No respiratory distress.     Breath sounds: Normal breath sounds. No wheezing or rales.  Abdominal:     General: Bowel sounds are normal. There is no distension.     Palpations: Abdomen is soft.     Tenderness: There is no abdominal tenderness. There is no rebound.  Musculoskeletal:        General: Tenderness present.     Cervical back: Normal range of motion.  Skin:    General: Skin is warm and dry.     Comments: Foot exam done  Neurological:     Mental Status: She is alert and oriented to person, place, and time.     Coordination: Coordination normal.    Vitals:   10/16/21 1058  BP: 124/84  Pulse: 67  Resp: 18  SpO2: 98%  Weight: 190 lb 9.6 oz (86.5 kg)  Height: 5' (1.524 m)    This visit occurred during the SARS-CoV-2 public health emergency.  Safety protocols were in place, including screening  questions prior to the visit, additional usage of staff PPE, and extensive cleaning of exam room while observing appropriate contact time as indicated for disinfecting solutions.   Assessment & Plan:  Visit time 35 minutes in face to face communication with patient and coordination of care, additional 15 minutes spent in record review, coordination or care, ordering tests, communicating/referring to other healthcare professionals, documenting in medical records all on the same day of the visit for total time 50 minutes spent on the visit.

## 2021-10-16 NOTE — Assessment & Plan Note (Signed)
Not well controlled, taking wellbutrin 300 mg daily and xanax 0.5 mg BID prn. She does not wish change today. Advised we could consider this in the future. Denies SI/HI.

## 2021-10-16 NOTE — Assessment & Plan Note (Signed)
Stage 3b and seeing nephrology twice a year. She is on lisinopril 20 mg daily. BP at goal and diabetes under control. Reminded to avoid NSAIDs.

## 2021-10-16 NOTE — Assessment & Plan Note (Signed)
Recent lipid panel reviewed and LDL at goal <100. Taking simvastatin 40 mg daily and will continue.

## 2021-10-16 NOTE — Assessment & Plan Note (Signed)
Taking tylenol for pain. This does limit her activity some.

## 2021-10-16 NOTE — Assessment & Plan Note (Signed)
Foot exam done. Diabetes under management by endo and taking glipizide 5 mg nightly and lantus 24 units daily. No low sugars. Prior control good. Is on ACE-I and statin. Eye exam yearly and up to date.

## 2021-10-16 NOTE — Assessment & Plan Note (Signed)
Taking wellbutrin 300 mg daily and xanax 0.5 mg BID prn. [place] narcotic database reviewed and appropriate. Refilled both today.

## 2021-10-16 NOTE — Assessment & Plan Note (Signed)
BMI 37 but complicated by OA, DM, HTN, hyperlipidemia. Unable to exercise much due to OA and working on diet. Counseled.

## 2021-10-16 NOTE — Assessment & Plan Note (Signed)
Next DEXA due 2024

## 2021-10-16 NOTE — Assessment & Plan Note (Signed)
Taking tylenol prn for pain.

## 2021-10-17 ENCOUNTER — Other Ambulatory Visit: Payer: Self-pay | Admitting: Cardiovascular Disease

## 2021-10-28 ENCOUNTER — Other Ambulatory Visit: Payer: Self-pay | Admitting: Cardiovascular Disease

## 2021-10-28 MED ORDER — EZETIMIBE 10 MG PO TABS: 10.0000 mg | ORAL_TABLET | Freq: Every day | ORAL | 3 refills | Status: DC

## 2021-11-06 ENCOUNTER — Other Ambulatory Visit: Payer: Self-pay | Admitting: Internal Medicine

## 2021-11-12 ENCOUNTER — Inpatient Hospital Stay: Payer: Medicare Other

## 2021-11-12 ENCOUNTER — Other Ambulatory Visit: Payer: Self-pay

## 2021-11-12 ENCOUNTER — Inpatient Hospital Stay: Payer: Medicare Other | Attending: Hematology | Admitting: Hematology

## 2021-11-12 VITALS — BP 141/53 | HR 71 | Temp 97.9°F | Resp 18 | Wt 197.5 lb

## 2021-11-12 DIAGNOSIS — C9110 Chronic lymphocytic leukemia of B-cell type not having achieved remission: Secondary | ICD-10-CM | POA: Diagnosis not present

## 2021-11-12 DIAGNOSIS — Z803 Family history of malignant neoplasm of breast: Secondary | ICD-10-CM | POA: Diagnosis not present

## 2021-11-12 DIAGNOSIS — Z808 Family history of malignant neoplasm of other organs or systems: Secondary | ICD-10-CM | POA: Diagnosis not present

## 2021-11-12 DIAGNOSIS — I10 Essential (primary) hypertension: Secondary | ICD-10-CM | POA: Diagnosis not present

## 2021-11-12 DIAGNOSIS — Z90710 Acquired absence of both cervix and uterus: Secondary | ICD-10-CM | POA: Insufficient documentation

## 2021-11-12 DIAGNOSIS — R918 Other nonspecific abnormal finding of lung field: Secondary | ICD-10-CM | POA: Insufficient documentation

## 2021-11-12 DIAGNOSIS — E119 Type 2 diabetes mellitus without complications: Secondary | ICD-10-CM | POA: Insufficient documentation

## 2021-11-12 DIAGNOSIS — D509 Iron deficiency anemia, unspecified: Secondary | ICD-10-CM | POA: Diagnosis not present

## 2021-11-12 DIAGNOSIS — Z8589 Personal history of malignant neoplasm of other organs and systems: Secondary | ICD-10-CM | POA: Insufficient documentation

## 2021-11-12 LAB — CMP (CANCER CENTER ONLY)
ALT: 9 U/L (ref 0–44)
AST: 12 U/L — ABNORMAL LOW (ref 15–41)
Albumin: 4.2 g/dL (ref 3.5–5.0)
Alkaline Phosphatase: 91 U/L (ref 38–126)
Anion gap: 6 (ref 5–15)
BUN: 40 mg/dL — ABNORMAL HIGH (ref 8–23)
CO2: 27 mmol/L (ref 22–32)
Calcium: 9.4 mg/dL (ref 8.9–10.3)
Chloride: 107 mmol/L (ref 98–111)
Creatinine: 2.04 mg/dL — ABNORMAL HIGH (ref 0.44–1.00)
GFR, Estimated: 25 mL/min — ABNORMAL LOW (ref >60)
Glucose, Bld: 214 mg/dL — ABNORMAL HIGH (ref 70–99)
Potassium: 4.8 mmol/L (ref 3.5–5.1)
Sodium: 140 mmol/L (ref 135–145)
Total Bilirubin: 0.5 mg/dL (ref 0.3–1.2)
Total Protein: 6 g/dL — ABNORMAL LOW (ref 6.5–8.1)

## 2021-11-12 LAB — CBC WITH DIFFERENTIAL (CANCER CENTER ONLY)
Abs Immature Granulocytes: 0.08 10*3/uL — ABNORMAL HIGH (ref 0.00–0.07)
Basophils Absolute: 0.1 10*3/uL (ref 0.0–0.1)
Basophils Relative: 1 %
Eosinophils Absolute: 0.2 10*3/uL (ref 0.0–0.5)
Eosinophils Relative: 1 %
HCT: 39.5 % (ref 36.0–46.0)
Hemoglobin: 12.2 g/dL (ref 12.0–15.0)
Immature Granulocytes: 1 %
Lymphocytes Relative: 64 %
Lymphs Abs: 10.2 10*3/uL — ABNORMAL HIGH (ref 0.7–4.0)
MCH: 28.6 pg (ref 26.0–34.0)
MCHC: 30.9 g/dL (ref 30.0–36.0)
MCV: 92.5 fL (ref 80.0–100.0)
Monocytes Absolute: 1.1 10*3/uL — ABNORMAL HIGH (ref 0.1–1.0)
Monocytes Relative: 7 %
Neutro Abs: 4.1 10*3/uL (ref 1.7–7.7)
Neutrophils Relative %: 26 %
Platelet Count: 143 10*3/uL — ABNORMAL LOW (ref 150–400)
RBC: 4.27 MIL/uL (ref 3.87–5.11)
RDW: 13.3 % (ref 11.5–15.5)
Smear Review: UNDETERMINED
WBC Count: 15.9 10*3/uL — ABNORMAL HIGH (ref 4.0–10.5)
nRBC: 0 % (ref 0.0–0.2)

## 2021-11-12 LAB — LACTATE DEHYDROGENASE: LDH: 153 U/L (ref 98–192)

## 2021-11-18 NOTE — Progress Notes (Addendum)
HEMATOLOGY/ONCOLOGY CLINIC NOTE  Date of Service: .11/12/2021   Patient Care Team: [name], MD as PCP - General (Internal Medicine) Rockey Situ Kathlene November, MD as PCP - Cardiology (Cardiology) Minna Merritts, MD as Consulting Physician (Cardiology)  CHIEF COMPLAINTS/PURPOSE OF CONSULTATION:  Follow-up for continued evaluation and management of CLL  HISTORY OF PRESENTING ILLNESS:  Please see previous note for details on initial presentation Interval History:  Brenda [name] is here for continued follow-up of her CLL after her last clinic visit about 6 months ago. She notes no acute new symptoms. No fevers no chills no night sweats no unexpected weight loss. No new unexplained fatigue. No new lumps or bumps. Labs done today show CBC with stable WBC count of 15.9k with lymphocytes of 10.2k, hemoglobin of 12.2 and platelets of 143k CMP LDH 153  MEDICAL HISTORY:  Past Medical History:  Diagnosis Date   Anxiety    Arthritis    Atrial fibrillation (HCC)    CLL (chronic lymphocytic leukemia) (HCC)    Dx. 2019 Dr. 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 Particia Nearing first visit 11-2019    SURGICAL HISTORY: Past Surgical  History:  Procedure Laterality Date   ABDOMINAL HYSTERECTOMY  1998   COLONOSCOPY     EYE SURGERY Bilateral    cataracts   EYE SURGERY Left    retina tear   FEMUR IM NAIL  10/25/2012   Procedure: INTRAMEDULLARY (IM) NAIL FEMORAL;  Surgeon: Mauri Pole, MD;  Location: [facility];  Service: Orthopedics;  Laterality: Left;   HIP SURGERY     Fracture L IM nail and 3 rods   left knee arthroscopy  12/2010   LYMPH NODE BIOPSY     axillary left 12-2018   REFRACTIVE SURGERY     skin cancer removed right and and lower forearm     squamoun in situ   TOTAL KNEE ARTHROPLASTY  12/20/2011   Procedure: TOTAL KNEE ARTHROPLASTY;  Surgeon: Gearlean Alf, MD;  Location: [facility];  Service: Orthopedics;  Laterality: Left;  Failed attempt at spinal    TOTAL SHOULDER ARTHROPLASTY Right 08/19/2016   Procedure: RIGHT TOTAL SHOULDER ARTHROPLASTY;  Surgeon: Justice Britain, MD;  Location: [place];  Service: Orthopedics;  Laterality: Right;   TOTAL SHOULDER ARTHROPLASTY Left 01/10/2020   Procedure: TOTAL SHOULDER ARTHROPLASTY;  Surgeon: Justice Britain, MD;  Location: [facility];  Service: Orthopedics;  Laterality: Left;  114mn    SOCIAL HISTORY: Social History   Socioeconomic History   Marital status: Single  Spouse name: Not on file   Number of children: Not on file   Years of education: Not on file   Highest education level: Not on file  Occupational History   Occupation: Therapist, 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    Employer: [employer]  Tobacco Use   Smoking status: Never   Smokeless tobacco: Never  Vaping Use   Vaping Use: Never used  Substance and Sexual Activity   Alcohol use: No   Drug use: No   Sexual activity: Not Currently  Other Topics Concern   Not on file  Social History Narrative   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            Social Determinants of Health   Financial Resource Strain: Not on file  Food Insecurity: No Food Insecurity   Worried About Charity fundraiser in the Last Year: Never true   [place] in the Last Year: Never true  Transportation Needs: No Transportation Needs   Lack of Transportation (Medical): No   Lack of Transportation (Non-Medical): No  Physical Activity: Not on file  Stress: Not on file  Social Connections: Not on file  Intimate Partner Violence: Not on file    FAMILY HISTORY: Family History  Problem Relation Age of Onset   Cancer Mother        breast cancer   Cancer Father        plasma sarcoma   Breast cancer Neg Hx     ALLERGIES:  is allergic to amlodipine, contrast media [iodinated contrast media], and nsaids.  MEDICATIONS:  Current Outpatient Medications  Medication Sig Dispense Refill   acetaminophen (TYLENOL) 500 MG tablet Take 500-1,000 mg by mouth every 6 (six) hours as needed for mild pain, moderate pain or fever.      ALPRAZolam (XANAX) 1 MG tablet TAKE 1/2 TO 1 TABLET(0.5 TO 1 MG) BY MOUTH TWICE DAILY AS NEEDED FOR ANXIETY 60 tablet 2   apixaban (ELIQUIS) 5 MG TABS tablet TAKE 1 TABLET(5 MG) BY MOUTH TWICE DAILY 60 tablet 5   BD PEN NEEDLE NANO 2ND GEN 32G X 4 MM MISC USE DAILY AS DIRECTED 100 each 3   Blood Glucose Monitoring Suppl (ONETOUCH VERIO) w/Device KIT Use as advised 1 kit 0   buPROPion (WELLBUTRIN XL) 300 MG 24 hr tablet Take 1 tablet (300 mg total) by mouth daily. 90 tablet 3   Cholecalciferol (VITAMIN D3) 2000 units TABS Take 2,000 mcg by mouth daily.     Cyanocobalamin (B-12) 1000 MCG SUBL Place 1,000 mcg under the tongue every evening.      doxazosin (CARDURA) 2 MG tablet Take 1 tablet (2 mg total) by mouth every evening. 90 tablet 3   esomeprazole (NEXIUM) 20 MG capsule Take 20 mg by mouth daily as needed (acid reflux symptoms).      ezetimibe (ZETIA) 10 MG tablet Take 1 tablet (10 mg total) by mouth daily. 90 tablet 3   glipiZIDE (GLUCOTROL XL) 2.5 MG 24 hr tablet Take 1 tablet in the evening 90 tablet 3   glucose blood (ONETOUCH VERIO) test strip USE TO CHECK BLOOD SUGAR THREE TIMES DAILY 300 strip 1   insulin glargine  (LANTUS SOLOSTAR) 100 UNIT/ML Solostar Pen Inject 24 Units into the skin daily. 30 mL 3   iron polysaccharides (NIFEREX) 150 MG capsule Take 150 mg by mouth at bedtime.      JARDIANCE 10 MG TABS tablet TAKE 1 TABLET(10 MG) BY MOUTH DAILY  BEFORE BREAKFAST 30 tablet 11   Lancet Devices (LANCING DEVICE) MISC Use as advised - Freestyle 1 each 0   lisinopril (ZESTRIL) 20 MG tablet Take 1 tablet (20 mg total) by mouth in the morning. 90 tablet 3   metoprolol succinate (TOPROL-XL) 50 MG 24 hr tablet TAKE 1 TABLET(50 MG) BY MOUTH TWICE DAILY AS DIRECTED 180 tablet 0   Metoprolol Succinate 100 MG CS24 Take 50 mg by mouth 2 (two) times daily. 30 capsule 11   NONFORMULARY OR COMPOUNDED ITEM Apply 1 application topically 2 (two) times daily as needed (sun damage on face). FLUOROURACIL 5% + CALCIPOTRIENE 0.005%     simvastatin (ZOCOR) 40 MG tablet TAKE 1 TABLET(40 MG) BY MOUTH AT BEDTIME 90 tablet 3   triamcinolone cream (KENALOG) 0.1 % Apply 1 application topically 2 (two) times daily as needed (skin rash.).      No current facility-administered medications for this visit.    REVIEW OF SYSTEMS:   .10 Point review of Systems was done is negative except as noted above.  PHYSICAL EXAMINATION: ECOG PERFORMANCE STATUS: 1 - Symptomatic but completely ambulatory  Vitals:   11/12/21 0857  BP: (!) 141/53  Pulse: 71  Resp: 18  Temp: 97.9 F (36.6 C)  SpO2: 93%    Filed Weights   11/12/21 0857  Weight: 197 lb 8 oz (89.6 kg)    .Body mass index is 38.57 kg/m.  Marland Kitchen GENERAL:alert, in no acute distress and comfortable SKIN: no acute rashes, no significant lesions EYES: conjunctiva are pink and non-injected, sclera anicteric OROPHARYNX: MMM, no exudates, no oropharyngeal erythema or ulceration NECK: supple, no JVD LYMPH:  no palpable lymphadenopathy in the cervical, axillary or inguinal regions LUNGS: clear to auscultation b/l with normal respiratory effort HEART: regular rate & rhythm ABDOMEN:   normoactive bowel sounds , non tender, not distended. Minimal bilateral cervical lymphadenopathy no palpable axillary or inguinal lymphadenopathy noted Extremity: no pedal edema PSYCH: alert & oriented x 3 with fluent speech NEURO: no focal motor/sensory deficits   LABORATORY DATA:  I have reviewed the data as listed  . CBC Latest Ref Rng & Units 11/12/2021 05/12/2021 11/12/2020  WBC 4.0 - 10.5 K/uL 15.9(H) 11.9(H) 12.1(H)  Hemoglobin 12.0 - 15.0 g/dL 12.2 12.5 12.8  Hematocrit 36.0 - 46.0 % 39.5 39.7 41.0  Platelets 150 - 400 K/uL 143(L) 145(L) 170   CBC    Component Value Date/Time   WBC 15.9 (H) 11/12/2021 0846   WBC 11.9 (H) 05/12/2021 0831   RBC 4.27 11/12/2021 0846   HGB 12.2 11/12/2021 0846   HCT 39.5 11/12/2021 0846   PLT 143 (L) 11/12/2021 0846   MCV 92.5 11/12/2021 0846   MCH 28.6 11/12/2021 0846   MCHC 30.9 11/12/2021 0846   RDW 13.3 11/12/2021 0846   LYMPHSABS 10.2 (H) 11/12/2021 0846   MONOABS 1.1 (H) 11/12/2021 0846   EOSABS 0.2 11/12/2021 0846   BASOSABS 0.1 11/12/2021 0846    CMP Latest Ref Rng & Units 11/12/2021 07/20/2021 05/12/2021  Glucose 70 - 99 mg/dL 214(H) - 148(H)  BUN 8 - 23 mg/dL 40(H) - 34(H)  Creatinine 0.44 - 1.00 mg/dL 2.04(H) - 1.57(H)  Sodium 135 - 145 mmol/L 140 - 141  Potassium 3.5 - 5.1 mmol/L 4.8 - 5.1  Chloride 98 - 111 mmol/L 107 - 107  CO2 22 - 32 mmol/L 27 - 24  Calcium 8.9 - 10.3 mg/dL 9.4 - 9.5  Total Protein 6.5 - 8.1 g/dL 6.0(L) 6.0 6.1(L)  Total Bilirubin  0.3 - 1.2 mg/dL 0.5 0.5 0.7  Alkaline Phos 38 - 126 U/L 91 94 75  AST 15 - 41 U/L 12(L) 14 15  ALT 0 - 44 U/L 9 13 11    . Lab Results  Component Value Date   LDH 153 11/12/2021      12/15/17 FISH CLL Prognostic Panel:    01/03/19 Left axillary LN biopsy:    RADIOGRAPHIC STUDIES: I have personally reviewed the radiological images as listed and agreed with the findings in the report. No results found.  ASSESSMENT & PLAN:  Brenda [name] is a 71 y.o.  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 Lab Results  Component Value Date   LDH 153 11/12/2021   05/01/18 CT C/A/P revealed Mildly progressive lymphadenopathy in the chest, abdomen, and pelvis, corresponding to the patient's known CLL, as above. Dominant left external iliac node measures 3.3 cm short axis, previously 2.8cm. Spleen is normal in size. Numerous bilateral pulmonary nodules measuring up to 9 mm, grossly unchanged from 2017. Prior bilateral pleural effusions have resolved.   09/22/18 [place] Bilateral Axilla revealed Ultrasound is performed, showing numerous enlarged LEFT axillary lymph nodes. Largest lymph node in the LEFT axilla has cortical thickening of 9 millimeters. Largest lymph node is 3.3 centimeters in length. Evaluation of the contralateral axilla for comparison demonstrate lymph nodes with cortical thickening. Largest lymph node in the RIGHT axilla is 3.4 x 1.3 centimeters.  01/03/19 Left axillary LN biopsy revealed CLL  2. Multiple pulmonary nodules   CT AP on 11/17/17 - with 1. Pelvic lymphadenopathy and innumerable pulmonary nodules in the lung bases, consistent with metastatic disease. 2. Cholelithiasis without CT evidence of acute cholecystitis. 3.  Aortic atherosclerosis  CT Chest on 11/20/17 - with multiple small lung nodules in LLL that appear stable from prior CT in 12/2015 and are considered benign and Upper lung nodules also appear benign. Also with several borderline enlarged lymph nodes concerning for a lymphoproliferative disorder   3. H/o endometrial cancer in 1998 -localized to uterus -treated with surgery, abdominal hysterectomy  -no evidence of recurrence  4. Microcytic anemia - Fe deficiency  Presented in hospital with  Hgb of 6.6 Hgb holding steady s/p 2U PRBC - Fe infusion completed 1/19 - stool hemoccult negative - suspect this may be due to CKD, but iron deficiency also worrisome for occult GIB  -Tolerated IV iron well  (hgb stable today).  5. Marginal B12 levels in hospital  (Antiparietal celll and anti IF Ab negative) -given one B12 injection in hospital -on replacement  PLAN:  --Patient patient's labs from 11/12/2021 were discussed in details CBC showed normal persistent leukocytosis of 16k with normal hemoglobin of 12.2 and stable platelets of 143k CMP shows creatinine of 2.04 -LDH within normal limits at 153 Patient has no clinical or lab evidence of significant CLL lymphoma progression. -no indication for starting CLL treatment at this -continue follow-up with primary care physician for management of other medical comorbidities  -continue skin screening with dermatology every 6 to 12 months .     FOLLOW UP:  RTC with Dr Irene Limbo with labs in 6 months   All of the patient's questions were answered with apparent satisfaction. The patient knows to call the clinic with any problems, questions or concerns.  Sullivan Lone MD MS AAHIVMS [facility] Hematology/Oncology Physician [facility]     .Marland Kitchen

## 2021-11-20 ENCOUNTER — Other Ambulatory Visit: Payer: Self-pay

## 2021-11-20 ENCOUNTER — Encounter: Payer: Self-pay | Admitting: Internal Medicine

## 2021-11-20 ENCOUNTER — Ambulatory Visit (INDEPENDENT_AMBULATORY_CARE_PROVIDER_SITE_OTHER): Payer: Medicare Other | Admitting: Internal Medicine

## 2021-11-20 VITALS — BP 110/68 | HR 66 | Ht 60.0 in | Wt 191.0 lb

## 2021-11-20 DIAGNOSIS — E1169 Type 2 diabetes mellitus with other specified complication: Secondary | ICD-10-CM

## 2021-11-20 DIAGNOSIS — E118 Type 2 diabetes mellitus with unspecified complications: Secondary | ICD-10-CM

## 2021-11-20 DIAGNOSIS — E785 Hyperlipidemia, unspecified: Secondary | ICD-10-CM | POA: Diagnosis not present

## 2021-11-20 DIAGNOSIS — Z6835 Body mass index (BMI) 35.0-35.9, adult: Secondary | ICD-10-CM | POA: Diagnosis not present

## 2021-11-20 LAB — POCT GLYCOSYLATED HEMOGLOBIN (HGB A1C): Hemoglobin A1C: 7 % — AB (ref 4.0–5.6)

## 2021-11-20 MED ORDER — LANTUS SOLOSTAR 100 UNIT/ML ~~LOC~~ SOPN: 24.0000 [IU] | PEN_INJECTOR | Freq: Every day | SUBCUTANEOUS | 3 refills | Status: DC

## 2021-11-20 NOTE — Progress Notes (Signed)
Patient ID: TRILBY WAY, female   DOB: 1951-06-22, 71 y.o.   MRN: [id number]  This visit occurred during the SARS-CoV-2 public health emergency.  Safety protocols were in place, including screening questions prior to the visit, additional usage of staff PPE, and extensive cleaning of exam room while observing appropriate contact time as indicated for disinfecting solutions.   HPI: Brenda [name] is a 71 y.o.-year-old female, presenting for follow-up for DM2, dx in 2007, insulin-independent, uncontrolled, with complications (CKD). Last visit 4 months ago.  Interim history: She has increased urination, but no blurry vision, nausea, chest pain. She has lifelong insomnia.  In the past, she was waking up at 3 AM to go to work at 5:30 AM.  Reviewed HbA1c levels: Lab Results  Component Value Date   HGBA1C 6.5 (A) 07/17/2021   HGBA1C 7.0 (A) 03/11/2021   HGBA1C 7.6 (A) 11/11/2020   Patient is on: - Lantus 12 units in a.m.- started 05/2019 by PCP >> 24 units after dinner - Glipizide XR  2.5 mg in am and 5 mg before a large dinner >> 5 >> 2.5 >> 5 mg before dinner  - Farxiga 5 mg >> Jardiance 10 before breakfast-added 11/2020 She was on Glipizide in 11/2015 after a steroid inj. We stopped Victoza due to nausea. Metformin ER was stopped by PCP due to CKD. Glipizide ER was stopped 06/2019 due to low blood sugars.  Pt checks her sugars twice a day: - am: 130-140, 160, 200 (after b'day) >> 80-134, 145, 150 >> 74-136 if taking Glipizide 2.5 mg before dinner, 140-160 if taking 2.5 mg Glipizide - 2h after b'fast: 156 >> n/c >> 148 >> n/c - before lunch: 50s (resolved), 90s-120 >> 58, 64-113, 126 >> 63, 81-140 - 2h after lunch: 118, 123 >> 92, 99 >> n/c >> 145 - before dinner: 80-143, 151 >> 62-160, 170 >> 120-140 >> 82-148 >> 90-164 - 2h after dinner:n/c >> 138, 148 >> n/c - bedtime: 107-128 >> 149-164 >> 130-140 >> 137, 142 >> n/c - nighttime: n/c Lowest sugar was 37 x1 >> ... 50s >> 58 >>  63 x1; she has hypoglycemia awareness in the 66s. Highest sugar was 200 >> 160 >> 164  Glucometer: Freestyle Lite  Pt's meals are: - Breakfast: English muffin + preserve or cereal or yoghurt - snack: fruit or fruit + yoghurt - Lunch: 1/2 sandwich + chips + salsa + fruit/sugar free >> protein bar - Dinner: soup or chicken/steak + veggies, occasional starch or Lean cuisine or light salad No soft drinks.  -+ CKD, last BUN/creatinine:  Lab Results  Component Value Date   BUN 40 (H) 11/12/2021   CREATININE 2.04 (H) 11/12/2021  On benazepril.  Sees nephrology. She had a high potassium (5.9) >> used Kayexalate x3 >> normalized.  She is on a low potassium diet.  -+ HL last set of lipids: Lab Results  Component Value Date   CHOL 124 07/20/2021   HDL 29 (L) 07/20/2021   LDLCALC 73 07/20/2021   LDLDIRECT 55.0 05/15/2020   TRIG 122 07/20/2021   CHOLHDL 4.3 07/20/2021  On Zocor and Zetia.  - last eye exam 03/2021: No DR; she had cataract surgery in 2016.  She has a history of retinal tear.  - no numbness and tingling in her feet.  She takes Neurontin for leg cramps at night.  She also has atrial fibrillation, HTN, GERD, depression. She was admitted 11/2017 for dehydration, anemia, and acute renal failure >> diagnosed with  CLL.  ROS: + see HPI  I reviewed pt's medications, allergies, PMH, social hx, family hx, and changes were documented in the history of present illness. Otherwise, unchanged from my initial visit note.  Past Medical History:  Diagnosis Date   Anxiety    Arthritis    Atrial fibrillation (HCC)    CLL (chronic lymphocytic leukemia) ([place])    Dx. 2019 Dr. 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 Particia Nearing first visit 11-2019   Past Surgical History:  Procedure Laterality Date   ABDOMINAL HYSTERECTOMY  1998   COLONOSCOPY     EYE SURGERY Bilateral    cataracts   EYE SURGERY Left    retina tear   FEMUR IM NAIL  10/25/2012   Procedure: INTRAMEDULLARY (IM) NAIL FEMORAL;  Surgeon: Mauri Pole, MD;  Location: [facility];  Service: Orthopedics;  Laterality: Left;   HIP SURGERY     Fracture L IM nail and 3 rods   left knee arthroscopy  12/2010   LYMPH NODE BIOPSY     axillary left 12-2018   REFRACTIVE SURGERY     skin cancer removed right and and lower forearm     squamoun in situ   TOTAL KNEE ARTHROPLASTY  12/20/2011   Procedure: TOTAL KNEE ARTHROPLASTY;  Surgeon: Gearlean Alf, MD;  Location: [facility];  Service: Orthopedics;  Laterality: Left;  Failed attempt at spinal    TOTAL SHOULDER ARTHROPLASTY Right 08/19/2016   Procedure: RIGHT TOTAL SHOULDER ARTHROPLASTY;  Surgeon: Justice Britain, MD;  Location: [facility];  Service: Orthopedics;  Laterality: Right;   TOTAL SHOULDER ARTHROPLASTY Left 01/10/2020   Procedure: TOTAL SHOULDER ARTHROPLASTY;  Surgeon: Justice Britain, MD;  [facility];  Service: Orthopedics;  Laterality: Left;  148mn   Social History   Social History   Marital Status: Single    Spouse Name: N/A   Number of Children: 0   Occupational History   RTherapist, sportsat [employer]    Social History Main Topics   Smoking status: Never Smoker    Smokeless tobacco: Never Used   Alcohol Use: No   Drug  Use: No   Social History [facility]ult [facility]   Current Outpatient Medications on File Prior to Visit  Medication Sig Dispense Refill   acetaminophen (TYLENOL) 500 MG tablet Take 500-1,000 mg by mouth every 6 (six) hours as needed for mild pain, moderate pain or fever.      ALPRAZolam (XANAX) 1 MG tablet TAKE 1/2 TO 1 TABLET(0.5  TO 1 MG) BY MOUTH TWICE DAILY AS NEEDED FOR ANXIETY 60 tablet 2   apixaban (ELIQUIS) 5 MG TABS tablet TAKE 1 TABLET(5 MG) BY MOUTH TWICE DAILY 60 tablet 5   BD PEN NEEDLE NANO 2ND GEN 32G X 4 MM MISC USE DAILY AS DIRECTED 100 each 3   Blood Glucose Monitoring Suppl (ONETOUCH VERIO) w/Device KIT Use as advised 1 kit 0   buPROPion (WELLBUTRIN XL) 300 MG 24 hr tablet Take 1 tablet (300 mg total) by mouth daily. 90 tablet 3   Cholecalciferol (VITAMIN D3) 2000 units TABS Take 2,000 mcg by mouth daily.     Cyanocobalamin (B-12) 1000 MCG SUBL Place 1,000 mcg under the tongue every evening.      doxazosin (CARDURA) 2 MG tablet Take 1 tablet (2 mg total) by mouth every evening. 90 tablet 3   esomeprazole (NEXIUM) 20 MG capsule Take 20 mg by mouth daily as needed (acid reflux symptoms).      ezetimibe (ZETIA) 10 MG tablet Take 1 tablet (10 mg total) by mouth daily. 90 tablet 3   glipiZIDE (GLUCOTROL XL) 2.5 MG 24 hr tablet Take 1 tablet in the evening 90 tablet 3   glucose blood (ONETOUCH VERIO) test strip USE TO CHECK BLOOD SUGAR THREE TIMES DAILY 300 strip 1   insulin glargine (LANTUS SOLOSTAR) 100 UNIT/ML Solostar Pen Inject 24 Units into the skin daily.     iron polysaccharides (NIFEREX) 150 MG capsule Take 150 mg by mouth at bedtime.      JARDIANCE 10 MG TABS tablet TAKE 1 TABLET(10 MG) BY MOUTH DAILY BEFORE BREAKFAST 30 tablet 11   Lancet Devices (LANCING DEVICE) MISC Use as advised - Freestyle 1 each 0   lisinopril (ZESTRIL) 20 MG tablet Take 1 tablet (20 mg total) by mouth in the morning. 90 tablet 3   metoprolol succinate (TOPROL-XL) 50 MG 24 hr tablet TAKE  1 TABLET(50 MG) BY MOUTH TWICE DAILY AS DIRECTED 180 tablet 0   Metoprolol Succinate 100 MG CS24 Take 50 mg by mouth 2 (two) times daily. 30 capsule 11   NONFORMULARY OR COMPOUNDED ITEM Apply 1 application topically 2 (two) times daily as needed (sun damage on face). FLUOROURACIL 5% + CALCIPOTRIENE 0.005%     simvastatin (ZOCOR) 40 MG tablet TAKE 1 TABLET(40 MG) BY MOUTH AT BEDTIME 90 tablet 3   triamcinolone cream (KENALOG) 0.1 % Apply 1 application topically 2 (two) times daily as needed (skin rash.).      No current facility-administered medications on file prior to visit.   Allergies  Allergen Reactions   Amlodipine     Swelling     Contrast Media [Iodinated Contrast Media] Other (See Comments)    Patient states unable to take / use due to kidney function   Nsaids     Due to kidney function   Family History  Problem Relation Age of Onset   Cancer Mother        breast cancer   Cancer Father        plasma sarcoma   Breast cancer Neg Hx    PE: BP 110/68 (BP Location: Left Arm, Patient Position: Sitting, Cuff Size: Normal)    Pulse 66    Ht 5' (1.524 m)    Wt 191 lb (86.6 kg)    SpO2 96%    BMI 37.30 kg/m   Wt Readings from Last 3 Encounters:  11/20/21 191 lb (86.6 kg)  11/12/21 197 lb 8 oz (89.6 kg)  10/16/21  190 lb 9.6 oz (86.5 kg)   Constitutional: overweight, in NAD Eyes: PERRLA, EOMI, no exophthalmos ENT: moist mucous membranes, no thyromegaly, no cervical lymphadenopathy Cardiovascular: RRR, No MRG, + LLE edema-chronic, L>R Respiratory: CTA B Musculoskeletal: no deformities, strength intact in all 4 Skin: moist, warm, no rashes Neurological: no tremor with outstretched hands, DTR normal in all 4  ASSESSMENT: 1. DM2, insulin-dependent, uncontrolled, with complications  - mild CKD  2. Obesity class 2  3. HL  PLAN:  1. Patient with longstanding, uncontrolled, type 2 diabetes, with much improved control after addition of insulin in summer 2020.  At that time,  metformin was stopped due to worsening kidney function.  She was on Victoza in the past but had to stop due to nausea.  She was reticent to try another GLP-1 receptor agonist afterwards.  At last visit, sugars are much better controlled with only occasional sugars above target but without significant hyperglycemic spikes.  She did have some low blood sugars midday and I advised her to reduce the glipizide before dinner.  She continues on Jardiance, which is expensive -she tolerates this well.  Latest HbA1c was better, at 6.5%. -At this visit, she tells me that she had to go back to the previous dose of glipizide before dinner, 5 mg daily, as the sugars are higher in the morning when taking only 2.5 mg.  She occasionally takes 2.5 mg but she does feel that sugars are in the 130s and 140s and even sometimes higher in the morning when she does so.  For now, I agree that she needs the higher dose of glipizide especially since she did not have any more lows since last visit (with the exception of one slightly low at 63 before lunch).  I did advise her, though, when she has a smaller meal, occasionally, to only use 2.5 mg of glipizide.  For now, we will continue the rest of the regimen. -At this visit, we reviewed together her most recent creatinine level which was slightly higher than before, at 2.04.  I advised her to check with her nephrologist to see when this needs to be repeated and also if we can continue Jardiance.  She is on a low-dose of Jardiance, which for now I advised her to continue until further instructions from nephrologist. - I suggested to:  Patient Instructions  Please change: - Glipizide XR (2.5-)5 mg before dinner  Continue: - Jardiance 10 mg before b'fast  - Lantus 24 units after dinner  Please return in 4 months with your sugar log.  - we checked her HbA1c: 7.0% (higher) - advised to check sugars at different times of the day - 1x a day, rotating check times - advised for yearly  eye exams >> she is UTD - return to clinic in 4 months    2. Obesity class 2 -She has problems eating the right foods, states she also has to limit potassium -We will continue Jardiance which should also help with weight loss -She gained 5 pounds before last visit, previously lost 9 -She gained a net 3 pounds since last visit  3.  Hyperlipidemia -Reviewed latest lipid panel from 07/2021: LDL very close to goal, HDL low, triglycerides at goal: Lab Results  Component Value Date   CHOL 124 07/20/2021   HDL 29 (L) 07/20/2021   LDLCALC 73 07/20/2021   LDLDIRECT 55.0 05/15/2020   TRIG 122 07/20/2021   CHOLHDL 4.3 07/20/2021  -She continues on Zocor 40 mg daily and Zetia  10 mg daily without side effects  Philemon Kingdom, MD PhD [facility] End[facility]

## 2021-11-20 NOTE — Patient Instructions (Addendum)
Please change: - Glipizide XR (2.5-)5 mg before dinner  Continue: - Jardiance 10 mg before b'fast  - Lantus 24 units after dinner  Please return in 4 months with your sugar log.

## 2021-11-25 ENCOUNTER — Other Ambulatory Visit: Payer: Self-pay | Admitting: Internal Medicine

## 2021-12-11 DIAGNOSIS — N1832 Chronic kidney disease, stage 3b: Secondary | ICD-10-CM | POA: Diagnosis not present

## 2021-12-20 ENCOUNTER — Other Ambulatory Visit: Payer: Self-pay | Admitting: Internal Medicine

## 2021-12-26 ENCOUNTER — Other Ambulatory Visit: Payer: Self-pay | Admitting: Cardiovascular Disease

## 2021-12-28 ENCOUNTER — Encounter: Payer: Self-pay | Admitting: Cardiovascular Disease

## 2022-01-14 ENCOUNTER — Other Ambulatory Visit: Payer: Self-pay | Admitting: Internal Medicine

## 2022-01-14 DIAGNOSIS — M25551 Pain in right hip: Secondary | ICD-10-CM | POA: Diagnosis not present

## 2022-01-17 ENCOUNTER — Other Ambulatory Visit: Payer: Self-pay | Admitting: Cardiovascular Disease

## 2022-01-17 NOTE — Progress Notes (Signed)
Patient ID: Brenda [name], female   DOB: 20-Apr-1951, 71 y.o.   MRN: [id number] ?Cardiology Office Note ? ?Date:  01/18/2022  ? ?ID:  Brenda [name], DOB 05-04-1951, MRN [id number] ? ?PCP:  [name], MD  ? ?Chief Complaint  ?Patient presents with  ? 6 month follow up   ?  "Doing well." Medications reviewed by the patient verbally.   ? ? ?HPI:  ?Brenda [name] is a  71 year old woman with history of ?coronary artery disease, CT coronary calcium score 1031 ?obesity, ?diabetes type II,  poorly controlled, hemoglobin A1c previously 12 ,  ?hyperlipidemia,  ?with previous stress test and echocardiogram for abnormal EKG,  ?CLL ?s/p shoulder replacement surgery ?Chronic renal sufficiency/nephropathy ?who presents for routine followup of her coronary artery disease , atrial fibrillation hyperlipidemia. ? ?Last seen in clinic September 2022 ?Diagnosed with atrial fibrillation on office visit May 2022, had mild shortness of breath ?A-fib on EKG ?Was started on Eliquis at that time, metoprolol succinate 50 twice daily  ? ? 03/12/21 and reported elevated heart rate. She was diagnosed with new afib. Eliquis 39m BID was started and metoprolol 581mBID. ASA was stopped ? ?taking Toprol 5019mID. Cardura previously stopped to allow for titration of BB. However, noted high Bps in the last few weeks so she re-started Cardura 2mg83mily  ? ?Takes all BP meds  ?Metoprolol  succinate BID/lisinopril  Am /carCyndee Brightly ? ?Chronic back pain ?Trouble with medication prices ? ?CBC : HGB 12.2 ?CR 1.5 up to 2.0 ?A1C 7.0 up from 6.5 ?WBC chronically elevated, 15.9 ?On simvastatin and Zetia ?Total cholesterol 124 LDL 73 ? ?EKG personally reviewed by myself on todays visit ?Normal sinus rhythm rate 63 no ST or T wave changes ? ?Other past medical hx reviewed ?In hospital 06/2020,  ? fall, fractured ribs, dehydration, acute renal failure ? weakness and dizziness ? acute kidney injury and has a serum creatinine of 4.63 compared to baseline of  1.67. ?Fever of 101.  So far cultures are negative.  Stool for C. difficile negative.  Patient on Rocephin and Flagyl ?Treated with midodrine ? follow-up with Dr. UptoHollie Salkm [place]  ?PT recommended home with home services 24-hour supervision ?Was very SOB for weeks after she got home ? ? hospital 11/2017 ?Dehydrated ?Lung nodules, enlarged lymph nodes ?Anemic  HBG 6.6 after hydration, iron def, stool negative ?BP was low. Acute renal failure ? ?CT coronary calcium score showing diffuse LAD disease, RCA disease ?Dramatically less left circumflex disease ? ? ?PMH:   has a past medical history of Anxiety, Arthritis, Atrial fibrillation (HCC), CLL (chronic lymphocytic leukemia) (HCC)[place], Diabetes mellitus, [facility], GERD (gastroesophageal reflux disease), Heart murmur, Heel spur, History of hiatal hernia, History of squamous cell carcinoma in situ (02/19/2019), Hyperkalemia, Hyperlipidemia, Hypertension, Left leg swelling, PONV (postoperative nausea and vomiting), and Renal insufficiency. ? ?PSH:    ?Past Surgical History:  ?Procedure Laterality Date  ? ABDOMINAL HYSTERECTOMY  1998  ? COLONOSCOPY    ? EYE SURGERY Bilateral   ? cataracts  ? EYE SURGERY Left   ? retina tear  ? FEMUR IM NAIL  10/25/2012  ? Procedure: INTRAMEDULLARY (IM) NAIL FEMORAL;  Surgeon: MattMauri Pole;  [facility];  Service: Orthopedics;  Laterality: Left;  ? HIP SURGERY    ? Fracture L IM nail and 3 rods  ? left knee arthroscopy  12/2010  ? LYMPH NODE BIOPSY    ?  axillary left 12-2018  ? REFRACTIVE SURGERY    ? skin cancer removed right and and lower forearm    ? squamoun in situ  ? TOTAL KNEE ARTHROPLASTY  12/20/2011  ? Procedure: TOTAL KNEE ARTHROPLASTY;  Surgeon: Gearlean Alf, MD;  [facility];  Service: Orthopedics;  Laterality: Left;  Failed attempt at spinal   ? TOTAL SHOULDER ARTHROPLASTY Right 08/19/2016  ? Procedure: RIGHT TOTAL SHOULDER ARTHROPLASTY;  Surgeon: Justice Britain,  MD;  Location: [place];  Service: Orthopedics;  Laterality: Right;  ? TOTAL SHOULDER ARTHROPLASTY Left 01/10/2020  ? Procedure: TOTAL SHOULDER ARTHROPLASTY;  Surgeon: Justice Britain, MD;  [facility];  Service: Orthopedics;  Laterality: Left;  138mn  ? ? ?Current Outpatient Medications  ?Medication Sig Dispense Refill  ? acetaminophen (TYLENOL) 500 MG tablet Take 500-1,000 mg by mouth every 6 (six) hours as needed for mild pain, moderate pain or fever.     ? ALPRAZolam (XANAX) 1 MG tablet TAKE 1/2 TO 1 TABLET(0.5 TO 1 MG) BY MOUTH TWICE DAILY AS NEEDED FOR ANXIETY 60 tablet 2  ? apixaban (ELIQUIS) 5 MG TABS tablet TAKE 1 TABLET(5 MG) BY MOUTH TWICE DAILY 60 tablet 5  ? BD PEN NEEDLE NANO 2ND GEN 32G X 4 MM MISC USE DAILY AS DIRECTED 100 each 3  ? Blood Glucose Monitoring Suppl (ONETOUCH VERIO) w/Device KIT Use as advised 1 kit 0  ? buPROPion (WELLBUTRIN XL) 300 MG 24 hr tablet Take 1 tablet (300 mg total) by mouth daily. 90 tablet 3  ? Cholecalciferol (VITAMIN D3) 2000 units TABS Take 2,000 mcg by mouth daily.    ? Cyanocobalamin (B-12) 1000 MCG SUBL Place 1,000 mcg under the tongue every evening.     ? doxazosin (CARDURA) 2 MG tablet Take 1 tablet (2 mg total) by mouth every evening. 90 tablet 3  ? esomeprazole (NEXIUM) 20 MG capsule Take 20 mg by mouth daily as needed (acid reflux symptoms). M-W-F    ? ezetimibe (ZETIA) 10 MG tablet Take 1 tablet (10 mg total) by mouth daily. 90 tablet 3  ? glipiZIDE (GLUCOTROL XL) 2.5 MG 24 hr tablet Take 1 tablet in the evening (Patient taking differently: 5 mg at bedtime.) 90 tablet 3  ? glucose blood (ONETOUCH VERIO) test strip USE AS DIRECTED TO TEST BLOOD SUGAR THREE TIMES DAILY 300 strip 1  ? insulin glargine (LANTUS SOLOSTAR) 100 UNIT/ML Solostar Pen Inject 24 Units into the skin daily. 30 mL 3  ? iron polysaccharides (NIFEREX) 150 MG capsule Take 150 mg by mouth at bedtime.     ? JARDIANCE 10 MG TABS tablet TAKE 1 TABLET(10 MG) BY MOUTH DAILY BEFORE BREAKFAST 30 tablet  11  ? Lancet Devices (LANCING DEVICE) MISC Use as advised - Freestyle 1 each 0  ? Lancets (ONETOUCH DELICA PLUS [id number] MISC CHECK BLOOD SUGAR THREE TIMES DAILY AS DIRECTED 300 each 1  ? lisinopril (ZESTRIL) 20 MG tablet Take 1 tablet (20 mg total) by mouth in the morning. 90 tablet 3  ? metoprolol succinate (TOPROL-XL) 50 MG 24 hr tablet TAKE 1 TABLET(50 MG) BY MOUTH TWICE DAILY AS DIRECTED 180 tablet 0  ? NONFORMULARY OR COMPOUNDED ITEM Apply 1 application topically 2 (two) times daily as needed (sun damage on face). FLUOROURACIL 5% + CALCIPOTRIENE 0.005%    ? simvastatin (ZOCOR) 40 MG tablet TAKE 1 TABLET(40 MG) BY MOUTH AT BEDTIME 90 tablet 0  ? triamcinolone cream (KENALOG) 0.1 % Apply 1 application topically 2 (two) times daily  as needed (skin rash.).     ? ?No current facility-administered medications for this visit.  ? ? ?Allergies:   Gadolinium derivatives, Nsaids, Amlodipine, and Contrast media [iodinated contrast media]  ? ?Social History:  The patient  reports that she has never smoked. She has never used smokeless tobacco. She reports that she does not drink alcohol and does not use drugs.  ? ?Family History:   family history includes Cancer in her father and mother.  ? ? ?Review of Systems: ?Review of Systems  ?Constitutional: Negative.   ?HENT: Negative.    ?Respiratory: Negative.    ?Cardiovascular: Negative.   ?Gastrointestinal: Negative.   ?Musculoskeletal:  Positive for back pain.  ?Neurological: Negative.   ?Psychiatric/Behavioral: Negative.  Suicidal ideas: .   ?All other systems reviewed and are negative. ? ? ?PHYSICAL EXAM: ?VS:  BP 130/62 (BP Location: Left Arm, Patient Position: Sitting, Cuff Size: Normal)   Pulse 63   Ht 5' 1"  (1.549 m)   Wt 194 lb (88 kg)   SpO2 97%   BMI 36.66 kg/m?  , BMI Body mass index is 36.66 kg/m?Marland Kitchen ?Constitutional:  oriented to person, place, and time. No distress.  ?HENT:  ?Head: Grossly normal ?Eyes:  no discharge. No scleral icterus.  ?Neck: No JVD, no  carotid bruits  ?Cardiovascular: Regular rate and rhythm, no murmurs appreciated ?Pulmonary/Chest: Clear to auscultation bilaterally, no wheezes or rails ?Abdominal: Soft.  no distension.  no tenderness.  ?M

## 2022-01-18 ENCOUNTER — Ambulatory Visit (INDEPENDENT_AMBULATORY_CARE_PROVIDER_SITE_OTHER): Payer: Medicare Other | Admitting: Cardiovascular Disease

## 2022-01-18 ENCOUNTER — Encounter: Payer: Self-pay | Admitting: Cardiovascular Disease

## 2022-01-18 ENCOUNTER — Other Ambulatory Visit: Payer: Self-pay

## 2022-01-18 VITALS — BP 130/62 | HR 63 | Ht 61.0 in | Wt 194.0 lb

## 2022-01-18 DIAGNOSIS — I25118 Atherosclerotic heart disease of native coronary artery with other forms of angina pectoris: Secondary | ICD-10-CM

## 2022-01-18 DIAGNOSIS — E782 Mixed hyperlipidemia: Secondary | ICD-10-CM

## 2022-01-18 DIAGNOSIS — I4891 Unspecified atrial fibrillation: Secondary | ICD-10-CM | POA: Diagnosis not present

## 2022-01-18 DIAGNOSIS — E1165 Type 2 diabetes mellitus with hyperglycemia: Secondary | ICD-10-CM

## 2022-01-18 DIAGNOSIS — C9110 Chronic lymphocytic leukemia of B-cell type not having achieved remission: Secondary | ICD-10-CM

## 2022-01-18 DIAGNOSIS — I10 Essential (primary) hypertension: Secondary | ICD-10-CM

## 2022-01-18 MED ORDER — SIMVASTATIN 40 MG PO TABS: ORAL_TABLET | ORAL | 3 refills | Status: DC

## 2022-01-18 MED ORDER — METOPROLOL SUCCINATE ER 50 MG PO TB24: ORAL_TABLET | ORAL | 3 refills | Status: DC

## 2022-01-18 NOTE — Patient Instructions (Signed)
Ask pharmacy about price of pradaxa ? ?Medication Instructions:  ?No changes ? ?If you need a refill on your cardiac medications before your next appointment, please call your pharmacy.  ? ?Lab work: ?No new labs needed ? ?Testing/Procedures: ?No new testing needed ? ?Follow-Up: ?At [facility], you and your health needs are our priority.  As part of our continuing mission to provide you with exceptional heart care, we have created designated Provider Care Teams.  These Care Teams include your primary Cardiologist (physician) and Advanced Practice Providers (APPs -  Physician Assistants and Nurse Practitioners) who all work together to provide you with the care you need, when you need it. ? ?You will need a follow up appointment in 12 months ? ?Providers on your designated Care Team:   ?Murray Hodgkins, NP ?Christell Faith, PA-C ?Cadence Kathlen Mody, PA-C ? ?COVID-19 Vaccine Information can be found at: ShippingScam.co.uk For questions related to vaccine distribution or appointments, please email [email or web address] .[email or web address] or call [phone].  ? ?

## 2022-01-19 ENCOUNTER — Telehealth: Payer: Self-pay | Admitting: *Deleted

## 2022-01-19 NOTE — Chronic Care Management (AMB) (Signed)
?  Care Management  ? ?Note ? ?01/19/2022 ?Name: [name] MRN: [id number] DOB: December 11, 1950 ? ?Brenda [name] is a 71 y.o. year old female who is a primary care patient of [name], MD and is actively engaged with the care management team. I reached out to Vertell Limber by phone today to assist with re-scheduling a follow up visit with the RN Case Manager ? ?Follow up plan: ?Telephone appointment with care management team member scheduled for: 01/28/2022 ? ?Arihana Ambrocio, CCMA ?Care Guide, Embedded Care Coordination ?[place]  Care Management  ?Direct Dial: [phone] ? ? ?

## 2022-01-28 ENCOUNTER — Ambulatory Visit (INDEPENDENT_AMBULATORY_CARE_PROVIDER_SITE_OTHER): Payer: Medicare Other | Admitting: *Deleted

## 2022-01-28 DIAGNOSIS — I10 Essential (primary) hypertension: Secondary | ICD-10-CM

## 2022-01-28 DIAGNOSIS — E118 Type 2 diabetes mellitus with unspecified complications: Secondary | ICD-10-CM

## 2022-01-28 NOTE — Patient Instructions (Signed)
Visit Information  ? ?Brenda [name], thank you for taking time to talk with me today. Please don't hesitate to contact me if I can be of assistance to you before our next scheduled telephone appointment ? ?Below are the goals we discussed today:  ?Patient Self-Care Activities: ?Patient Brenda [name] will: ?Take medications as prescribed ?Attend all scheduled provider appointments ?Call pharmacy for medication refills ?Call provider office for new concerns or questions ?Continue to check fasting (first thing in the morning, before eating) and later in day blood sugars at home as you have been ?Continue to check blood pressures at home as you have been ?Continue to follow heart healthy, low salt, low cholesterol, carbohydrate-modified, low sugar diet ?Keep up the  great work at preventing recent falls- take your time, don't rush; don't over-do with activity, pace yourself ? ?Our next scheduled telephone follow up visit/ appointment is scheduled on: Tuesday, April 20, 2022 at 9:00 am- This is a PHONE [place] appointment ? ?If you need to cancel or re-schedule our visit, please call [phone] and our care guide team will be happy to assist you. ?  ?I look forward to hearing about your progress. ?  ?Oneta Rack, RN, BSN, CCRN Alumnus ?[place] ?[phone]: direct office ? ?If you are experiencing a Mental Health or [place] or need someone to talk to, please  ?call the Suicide and Crisis Lifeline: 988 ?call the [place] National Suicide Prevention Lifeline: 9386392967 or TTY: (989) 707-8458 TTY 916-749-6828) to talk to a trained counselor ?call 1-800-273-TALK (toll free, 24 hour hotline) ?go to [facility] [address] [place] [phone]) ?call 911  ? ?Living With Diabetes ?Diabetes (type 1 diabetes mellitus or type 2 diabetes mellitus) is a condition in which the body does not have enough of a hormone called insulin,  or the body does not respond properly to insulin. Normally, insulin allows sugars (glucose) to enter cells in the body. With diabetes, extra glucose builds up in the blood instead of going into cells. This results in high blood glucose (hyperglycemia). ?How to manage lifestyle changes ?Managing diabetes includes medical treatments as well as lifestyle changes. If diabetes is not managed well, serious physical and emotional complications can occur. Taking good care of yourself means that you are responsible for: ?Monitoring glucose regularly. ?Eating a healthy diet. ?Exercising regularly. ?Meeting with health care providers. ?Taking medicines as directed. ?Most people feel some stress about managing their diabetes. When this stress becomes too much, it is known as diabetes-related distress. This is very common. Living with diabetes can place you at risk for diabetes distress, depression, or anxiety. These disorders can make diabetes more difficult to manage. ?How to recognize stress ?You may have diabetes distress if you: ?Avoid or ignore your daily diabetes care. This includes glucose testing, following a meal plan, and taking medications. ?Feel overwhelmed by your daily diabetes care. ?Experience emotional reactions such as anger, sadness, or fear related to your daily diabetes care. ?Feel fear or shame about not doing everything perfectly that you have been told to do. ?Emotional distress ?Symptoms of diabetes distress include: ?Anger about having a diagnosis of diabetes. ?Fear or frustration about your diagnosis and the changes you need to make to manage the condition. ?Being overly worried about the care that you need or the cost of the care that you need. ?Feeling like you caused your condition by doing something wrong. ?Fear about unpredictable fluctuations in your blood glucose,  like low or high blood glucose. ?Feeling judged by your health care providers. ?Feeling very alone with the  disease. ?Depression ?Having diabetes means that you are at a higher risk for depression. Your health care provider may test (screen) you for symptoms of depression. It is important to recognize symptoms and to start treatment for depression soon after it is diagnosed. The following are some symptoms of depression: ?Loss of interest in things that you used to enjoy. ?Feeling depressed much or most of the time. ?A change in appetite. ?Trouble getting to sleep or staying asleep. ?Feeling tired most of the day. ?Feeling nervous and anxious. ?Feeling guilty and worrying that you are a burden to others. ?Having thoughts of hurting yourself or feeling that you want to die. ?If you have any of these symptoms, more days than not, for 2 weeks or longer, you may have depression. This would be a good time to contact your health care provider. ?Follow these instructions at home: ?Managing diabetes distress ?The following are some ways to manage emotional distress: ?Learn as much as you can about diabetes and its treatment. Take one step at a time to improve your management. ?Meet with a certified diabetes care and education specialist. Take a class to learn how to manage your condition. ?Consider working with a Social worker or therapist. ?Keep a journal of your thoughts and concerns. ?Accept that some things are out of your control. ?Talk with other people who have diabetes. It can help to talk about the distress that you feel. ?Find ways to manage stress that work for you. These may include art or music therapy, exercise, meditation, and hobbies. ?Seek support from spiritual leaders, family, and friends. ? ?General instructions ?Do your best to follow your diabetes management plan. ?If you are struggling to follow your plan, talk with a certified diabetes care and education specialist, or with someone else who has diabetes. They may have ideas that will help. ?Forgive yourself for not being perfect. Almost everyone struggles with  the tasks of diabetes. ?Keep all follow-up visits. This is important. ?Where to find support ?Search for information and support from the American Diabetes Association: www.diabetes.org ?Find a certified diabetes education and care specialist. Make an appointment through the Association of Diabetes Care & Education Specialists: www.diabeteseducator.org ?Contact a health care provider if: ?You believe your diabetes is getting out of control. ?You are concerned you may be depressed. ?You think your medications are not helping control your diabetes. ?You are feeling overwhelmed with your diabetes. ?Get help right away if: ?You have thoughts about hurting yourself or others. ?If you ever feel like you may hurt yourself or others, or have thoughts about taking your own life, get help right away. You can go to your nearest emergency department or call: ?Your local emergency services (911 in the [place] ?A suicide crisis helpline, such as the [place] at (782) 177-5164 or 988 in the [place]. This is open 24 hours a day. ?Summary ?Diabetes (type 1 diabetes mellitus or type 2 diabetes mellitus) is a condition in which the body does not have enough of a hormone called insulin, or the body does not respond properly to insulin. ?Living with diabetes puts you at risk for medical and emotional issues, such as diabetes distress, depression, and anxiety. ?Recognizing the symptoms of diabetes distress and depression may help you avoid problems with your diabetes control. If you experience symptoms, it is important to discuss this with your health care provider, certified diabetes care and  education specialist, or therapist. ?It is important to start treatment for diabetes distress and depression soon after diagnosis. ?Ask your health care provider to recommend a therapist who understands both depression and diabetes. ?This information is not intended to replace advice given to you by your health care provider.  Make sure you discuss any questions you have with your health care provider. ?Document Revised: 05/13/2021 Document Reviewed: 02/28/2020 ?Elsevier Patient Education ? [place]. ? ?Following is a copy of your full c

## 2022-01-28 NOTE — Chronic Care Management (AMB) (Signed)
?Chronic Care Management  ? ?CCM RN Visit Note ? ?01/28/2022 ?Name: Brenda [name] MRN: [id number] DOB: [birth year] ? ?Subjective: ?Brenda [name] is a 71 y.o. year old female who is a primary care patient of [name], MD. The care management team was consulted for assistance with disease management and care coordination needs.   ? ?Engaged with patient by telephone for initial visit in response to provider referral for case management and/or care coordination services.  ? ?Consent to Services:  ?The patient was given information about Chronic Care Management services, agreed to services, and gave verbal consent 04/30/21 prior to initiation of services.  Please see initial visit note for detailed documentation.  Patient agreed to services and verbal consent obtained.  ? ?Assessment: Review of patient past medical history, allergies, medications, health status, including review of consultants reports, laboratory and other test data, was performed as part of comprehensive evaluation and provision of chronic care management services.  ? ?SDOH (Social Determinants of Health) assessments and interventions performed:  ?SDOH Interventions   ? ?Flowsheet Row Most Recent Value  ?SDOH Interventions   ?Food Insecurity Interventions Intervention Not Indicated  [denies food insecurity]  ?Housing Interventions Intervention Not Indicated  [lives in one level single family home x 15 years,  3 steps off entry- with handrails,  no safety concerns identified]  ?Transportation Interventions Intervention Not Indicated  [drives self]  ? ?  ? ?CCM Care Plan ? ?Allergies  ?Allergen Reactions  ? Gadolinium Derivatives Other (See Comments)  ? Nsaids Other (See Comments)  ?  Due to kidney function  ? Amlodipine   ?  Swelling  ?  ? Contrast Media [Iodinated Contrast Media] Other (See Comments)  ?  Patient states unable to take / use due to kidney function  ? ?Outpatient Encounter Medications as of 01/28/2022  ?Medication Sig  ?  acetaminophen (TYLENOL) 500 MG tablet Take 500-1,000 mg by mouth every 6 (six) hours as needed for mild pain, moderate pain or fever.   ? ALPRAZolam (XANAX) 1 MG tablet TAKE 1/2 TO 1 TABLET(0.5 TO 1 MG) BY MOUTH TWICE DAILY AS NEEDED FOR ANXIETY  ? apixaban (ELIQUIS) 5 MG TABS tablet TAKE 1 TABLET(5 MG) BY MOUTH TWICE DAILY  ? BD PEN NEEDLE NANO 2ND GEN 32G X 4 MM MISC USE DAILY AS DIRECTED  ? Blood Glucose Monitoring Suppl (ONETOUCH VERIO) w/Device KIT Use as advised  ? buPROPion (WELLBUTRIN XL) 300 MG 24 hr tablet Take 1 tablet (300 mg total) by mouth daily.  ? Cholecalciferol (VITAMIN D3) 2000 units TABS Take 2,000 mcg by mouth daily.  ? Cyanocobalamin (B-12) 1000 MCG SUBL Place 1,000 mcg under the tongue every evening.   ? doxazosin (CARDURA) 2 MG tablet Take 1 tablet (2 mg total) by mouth every evening.  ? esomeprazole (NEXIUM) 20 MG capsule Take 20 mg by mouth daily as needed (acid reflux symptoms). M-W-[id number]  ? ezetimibe (ZETIA) 10 MG tablet Take 1 tablet (10 mg total) by mouth daily.  ? glipiZIDE (GLUCOTROL XL) 2.5 MG 24 hr tablet Take 1 tablet in the evening (Patient taking differently: 5 mg at bedtime.)  ? glucose blood (ONETOUCH VERIO) test strip USE AS DIRECTED TO TEST BLOOD SUGAR THREE TIMES DAILY  ? insulin glargine (LANTUS SOLOSTAR) 100 UNIT/ML Solostar Pen Inject 24 Units into the skin daily.  ? iron polysaccharides (NIFEREX) 150 MG capsule Take 150 mg by mouth at bedtime.   ? JARDIANCE 10 MG TABS tablet TAKE 1 TABLET(10 MG) BY  MOUTH DAILY BEFORE BREAKFAST  ? Lancet Devices (LANCING DEVICE) MISC Use as advised - Freestyle  ? Lancets (ONETOUCH DELICA PLUS [id number]) MISC CHECK BLOOD SUGAR THREE TIMES DAILY AS DIRECTED  ? lisinopril (ZESTRIL) 20 MG tablet Take 1 tablet (20 mg total) by mouth in the morning.  ? metoprolol succinate (TOPROL-XL) 50 MG 24 hr tablet Take with or immediately following a meal.  ? NONFORMULARY OR COMPOUNDED ITEM Apply 1 application topically 2 (two) times daily as needed (sun  damage on face). FLUOROURACIL 5% + CALCIPOTRIENE 0.005%  ? simvastatin (ZOCOR) 40 MG tablet TAKE 1 TABLET(40 MG) BY MOUTH AT BEDTIME  ? triamcinolone cream (KENALOG) 0.1 % Apply 1 application topically 2 (two) times daily as needed (skin rash.).   ? ?No facility-administered encounter medications on file as of 01/28/2022.  ? ?Patient Active Problem List  ? Diagnosis Date Noted  ? Osteopenia 05/22/2020  ? Well woman exam without gynecological exam 05/02/2019  ? SCC (squamous cell carcinoma), hand, right 05/02/2019  ? CKD (chronic kidney disease) stage 3, GFR 30-59 ml/min (HCC) 07/05/2018  ? CLL (chronic lymphocytic leukemia) ([place]) 03/13/2018  ? Anxiety 03/13/2018  ? Bilateral edema of lower extremity 01/18/2018  ? History of endometrial cancer 11/18/2017  ? Pelvic lymphadenopathy 11/18/2017  ? Daytime sleepiness 08/22/2017  ? Coronary artery disease of native artery of native heart with stable angina pectoris ([place]) 04/03/2017  ? 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 Diabetes mellitus type 2 with complications ([place]) 24/58/0998  ? Morbid obesity ([place]) 01/24/2015  ? Chronic radicular low back pain 04/01/2014  ? Tachycardia 01/31/2013  ? OA (osteoarthritis) 09/20/2011  ? Hyperlipidemia associated with type 2 diabetes mellitus ([place]) 11/06/2010  ? Depression with anxiety 11/06/2010  ? Essential hypertension 11/06/2010  ? GERD 11/06/2010  ? ?Conditions to be addressed/monitored:  HTN and DMII ? ?Care Plan : Cardiovascular  ?Updates made by Knox Royalty, RN since 01/28/2022 12:00 AM  ?  ? ?Problem: Minimize/ prevent disease progression ( Atrial fibrillation/ hypertension/ diabetes) Deleted 01/28/2022  ?Priority: High  ?  ? ?Long-Range Goal: Monitoring and management of Atrial fibrillation and hypertension Completed 01/28/2022  ?Start Date: 05/20/2021  ?Expected End Date: 10/30/2021  ?Recent Progress: On track  ?Priority: Medium  ?Note:   ?Current Barriers:  ?Knowledge deficits related to long term care plan for self  health management of health conditions: Atrial fibrillation, hypertension, diabetes  ?Patient states she is doing well. She reports have recent follow up with her cardiologist, endocrinologist and nephrologist.  Patient states her cardiologist adjusted the time she is to take one of her blood pressure medications.  She reports recent blood pressure readings:  122/60, 130/61, 129/66, 124/63.  She reports her pulse runs in the 60's.  Patient reports her endocrinologist decreased her glipizide dosage due to occasional midday lows.  She states her morning blood sugars started trending up in the 130's to 150's.  She states she notified the doctor and she was placed back on her original dose of glipizide.  She will report any ongoing low blood sugars as needed.  Patient reports having 2 falls since last outreach with RNCM.  She reports one fall was due to being stung by wasps near her mailbox and the other she leaned over to far in her bar stool chair and tumbled out.  Patient states she sustained bruised ribs and some abrasions and injury to her toe.  She states all has healed and denies any further concerns. Patient states she did not  qualify for medication assistance but her two medications of concern has gone to a different tier level with her insurance, therefore now more affordable.    ?Case Manager Clinical Goal(s):  ?patient will take medications  as prescribed and will call provider for medication related questions ?patient will verbalize understanding of Afib Action Plan and when to call doctor ?patient will attend  scheduled medical appointments ?the patient will demonstrate ongoing self health care management ability as evidenced by monitoring and recording blood pressure and pulse daily and reporting any abnormal values to provider ?Patient will work with practice pharmacist regarding medication assistance.  ? ?01/28/22- Goal completed due to duplication of goals, progression of care plan with conversion to  new format; goals re-established and extended today ? ? ?  ? ?Care Plan : RN Care Manager Plan of Care  ?Updates made by Knox Royalty, RN since 01/28/2022 12:00 AM  ?  ? ?Problem: Chronic Disease Management

## 2022-02-14 ENCOUNTER — Other Ambulatory Visit: Payer: Self-pay | Admitting: Cardiovascular Disease

## 2022-02-17 DIAGNOSIS — I129 Hypertensive chronic kidney disease with stage 1 through stage 4 chronic kidney disease, or unspecified chronic kidney disease: Secondary | ICD-10-CM | POA: Diagnosis not present

## 2022-02-17 DIAGNOSIS — E1122 Type 2 diabetes mellitus with diabetic chronic kidney disease: Secondary | ICD-10-CM | POA: Diagnosis not present

## 2022-02-17 DIAGNOSIS — C9110 Chronic lymphocytic leukemia of B-cell type not having achieved remission: Secondary | ICD-10-CM | POA: Diagnosis not present

## 2022-02-17 DIAGNOSIS — E785 Hyperlipidemia, unspecified: Secondary | ICD-10-CM | POA: Diagnosis not present

## 2022-02-17 DIAGNOSIS — I4891 Unspecified atrial fibrillation: Secondary | ICD-10-CM | POA: Diagnosis not present

## 2022-02-17 DIAGNOSIS — N1832 Chronic kidney disease, stage 3b: Secondary | ICD-10-CM | POA: Diagnosis not present

## 2022-03-02 ENCOUNTER — Other Ambulatory Visit: Payer: Self-pay | Admitting: Cardiovascular Disease

## 2022-03-02 ENCOUNTER — Encounter: Payer: Self-pay | Admitting: Cardiovascular Disease

## 2022-03-02 NOTE — Telephone Encounter (Signed)
Prescription refill request for Eliquis received. ?Indication: Afib  ?Last office visit: 01/21/22 Rockey Situ)  ?Scr: 2.04 (11/12/21) ?Age: 71 ?Weight: 88kg ? ?Appropriate dose and refill sent to requested pharmacy.  ?

## 2022-03-02 NOTE — Telephone Encounter (Signed)
Please review

## 2022-03-02 NOTE — Telephone Encounter (Signed)
Refill Request.  

## 2022-03-10 ENCOUNTER — Ambulatory Visit (INDEPENDENT_AMBULATORY_CARE_PROVIDER_SITE_OTHER): Payer: Medicare Other

## 2022-03-10 DIAGNOSIS — Z0000 Encounter for general adult medical examination without abnormal findings: Secondary | ICD-10-CM

## 2022-03-10 NOTE — Patient Instructions (Signed)
Brenda [name] , ?Thank you for taking time to come for your Medicare Wellness Visit. I appreciate your ongoing commitment to your health goals. Please review the following plan we discussed and let me know if I can assist you in the future.  ? ?Screening recommendations/referrals: ?Colonoscopy: 08/31/2018; due every 10 years ?Mammogram: 09/28/2021; due every year ?Bone Density: 05/21/2020; due every 2-3 years ?Recommended yearly ophthalmology/optometry visit for glaucoma screening and checkup ?Recommended yearly dental visit for hygiene and checkup ? ?Vaccinations: ?Influenza vaccine: 07/10/2021 ?Pneumococcal vaccine: 10/01/2014, 12/19/2017 ?Tdap vaccine: 05/15/2020; due every 10 years ?Shingles vaccine: 05/15/2020, 07/16/2020   ?Covid-19: 12/31/2019, 01/21/2020, 07/31/2020, 02/09/2021, 08/04/2021 ? ?Advanced directives: Yes; Please bring a copy of your health care power of attorney and living will to the office at your convenience. ? ?Conditions/risks identified: Yes ? ?Next appointment: Please schedule your next Medicare Wellness Visit with your Nurse Health Advisor in 1 year by calling [phone]. ? ? ?Preventive Care 49 Years and Older, Female ?Preventive care refers to lifestyle choices and visits with your health care provider that can promote health and wellness. ?What does preventive care include? ?A yearly physical exam. This is also called an annual well check. ?Dental exams once or twice a year. ?Routine eye exams. Ask your health care provider how often you should have your eyes checked. ?Personal lifestyle choices, including: ?Daily care of your teeth and gums. ?Regular physical activity. ?Eating a healthy diet. ?Avoiding tobacco and drug use. ?Limiting alcohol use. ?Practicing safe sex. ?Taking low-dose aspirin every day. ?Taking vitamin and mineral supplements as recommended by your health care provider. ?What happens during an annual well check? ?The services and screenings done by your health care provider during  your annual well check will depend on your age, overall health, lifestyle risk factors, and family history of disease. ?Counseling  ?Your health care provider may ask you questions about your: ?Alcohol use. ?Tobacco use. ?Drug use. ?Emotional well-being. ?Home and relationship well-being. ?Sexual activity. ?Eating habits. ?History of falls. ?Memory and ability to understand (cognition). ?Work and work Statistician. ?Reproductive health. ?Screening  ?You may have the following tests or measurements: ?Height, weight, and BMI. ?Blood pressure. ?Lipid and cholesterol levels. These may be checked every 5 years, or more frequently if you are over 64 years old. ?Skin check. ?Lung cancer screening. You may have this screening every year starting at age 66 if you have a 30-pack-year history of smoking and currently smoke or have quit within the past 15 years. ?Fecal occult blood test (FOBT) of the stool. You may have this test every year starting at age 28. ?Flexible sigmoidoscopy or colonoscopy. You may have a sigmoidoscopy every 5 years or a colonoscopy every 10 years starting at age 55. ?Hepatitis C blood test. ?Hepatitis B blood test. ?Sexually transmitted disease (STD) testing. ?Diabetes screening. This is done by checking your blood sugar (glucose) after you have not eaten for a while (fasting). You may have this done every 1-3 years. ?Bone density scan. This is done to screen for osteoporosis. You may have this done starting at age 59. ?Mammogram. This may be done every 1-2 years. Talk to your health care provider about how often you should have regular mammograms. ?Talk with your health care provider about your test results, treatment options, and if necessary, the need for more tests. ?Vaccines  ?Your health care provider may recommend certain vaccines, such as: ?Influenza vaccine. This is recommended every year. ?Tetanus, diphtheria, and acellular pertussis (Tdap, Td) vaccine. You may need a  Td booster every 10  years. ?Zoster vaccine. You may need this after age 2. ?Pneumococcal 13-valent conjugate (PCV13) vaccine. One dose is recommended after age 42. ?Pneumococcal polysaccharide (PPSV23) vaccine. One dose is recommended after age 75. ?Talk to your health care provider about which screenings and vaccines you need and how often you need them. ?This information is not intended to replace advice given to you by your health care provider. Make sure you discuss any questions you have with your health care provider. ?Document Released: 11/14/2015 Document Revised: 07/07/2016 Document Reviewed: 08/19/2015 ?[employer] Interactive Patient Education ? 2017 [place]. ? ?Fall Prevention in the Home ?Falls can cause injuries. They can happen to people of all ages. There are many things you can do to make your home safe and to help prevent falls. ?What can I do on the outside of my home? ?Regularly fix the edges of walkways and driveways and fix any cracks. ?Remove anything that might make you trip as you walk through a door, such as a raised step or threshold. ?Trim any bushes or trees on the path to your home. ?Use bright outdoor lighting. ?Clear any walking paths of anything that might make someone trip, such as rocks or tools. ?Regularly check to see if handrails are loose or broken. Make sure that both sides of any steps have handrails. ?Any raised decks and porches should have guardrails on the edges. ?Have any leaves, snow, or ice cleared regularly. ?Use sand or salt on walking paths during winter. ?Clean up any spills in your garage right away. This includes oil or grease spills. ?What can I do in the bathroom? ?Use night lights. ?Install grab bars by the toilet and in the tub and shower. Do not use towel bars as grab bars. ?Use non-skid mats or decals in the tub or shower. ?If you need to sit down in the shower, use a plastic, non-slip stool. ?Keep the floor dry. Clean up any water that spills on the floor as soon as it  happens. ?Remove soap buildup in the tub or shower regularly. ?Attach bath mats securely with double-sided non-slip rug tape. ?Do not have throw rugs and other things on the floor that can make you trip. ?What can I do in the bedroom? ?Use night lights. ?Make sure that you have a light by your bed that is easy to reach. ?Do not use any sheets or blankets that are too big for your bed. They should not hang down onto the floor. ?Have a firm chair that has side arms. You can use this for support while you get dressed. ?Do not have throw rugs and other things on the floor that can make you trip. ?What can I do in the kitchen? ?Clean up any spills right away. ?Avoid walking on wet floors. ?Keep items that you use a lot in easy-to-reach places. ?If you need to reach something above you, use a strong step stool that has a grab bar. ?Keep electrical cords out of the way. ?Do not use floor polish or wax that makes floors slippery. If you must use wax, use non-skid floor wax. ?Do not have throw rugs and other things on the floor that can make you trip. ?What can I do with my stairs? ?Do not leave any items on the stairs. ?Make sure that there are handrails on both sides of the stairs and use them. Fix handrails that are broken or loose. Make sure that handrails are as long as the stairways. ?Check any  carpeting to make sure that it is firmly attached to the stairs. Fix any carpet that is loose or worn. ?Avoid having throw rugs at the top or bottom of the stairs. If you do have throw rugs, attach them to the floor with carpet tape. ?Make sure that you have a light switch at the top of the stairs and the bottom of the stairs. If you do not have them, ask someone to add them for you. ?What else can I do to help prevent falls? ?Wear shoes that: ?Do not have high heels. ?Have rubber bottoms. ?Are comfortable and fit you well. ?Are closed at the toe. Do not wear sandals. ?If you use a stepladder: ?Make sure that it is fully opened.  Do not climb a closed stepladder. ?Make sure that both sides of the stepladder are locked into place. ?Ask someone to hold it for you, if possible. ?Clearly mark and make sure that you can see: ?Any grab b

## 2022-03-10 NOTE — Progress Notes (Addendum)
I connected with Brenda [name] today by telephone and verified that I am speaking with the correct person using two identifiers. Location patient: home Location provider: work Persons participating in the virtual visit: patient, provider.   I discussed the limitations, risks, security and privacy concerns of performing an evaluation and management service by telephone and the availability of in person appointments. I also discussed with the patient that there may be a patient responsible charge related to this service. The patient expressed understanding and verbally consented to this telephonic visit.    Interactive audio and video telecommunications were attempted between this provider and patient, however failed, due to patient having technical difficulties OR patient did not have access to video capability.  We continued and completed visit with audio only.  Some vital signs may be absent or patient reported.   Time Spent with patient on telephone encounter: 30 minutes  Subjective:   Brenda [name] is a 71 y.o. female who presents for Medicare Annual (Subsequent) preventive examination.  Review of Systems     Cardiac Risk Factors include: advanced age (>42mn, >>73women);diabetes mellitus;dyslipidemia;hypertension;obesity (BMI >30kg/m2)     Objective:    Today's Vitals   03/10/22 1144  PainSc: 0-No pain   There is no height or weight on file to calculate BMI.     03/10/2022   11:33 AM 05/20/2021   11:51 AM 10/14/2020    3:10 PM 06/23/2020   10:00 PM 06/22/2020    8:35 AM 05/12/2020   10:11 AM 01/10/2020   10:36 AM  Advanced Directives  Does Patient Have a Medical Advance Directive? Yes Yes Yes Yes Yes No Yes  Type of Advance Directive Living will;Healthcare Power of [place]    [place] will  Does patient want to make changes to medical advance directive? No -  Patient declined No - Patient declined  No - Patient declined   No - Patient declined  Copy of [place]itt[place]in Chart? No - copy requested  No - copy requested    No - copy requested  Would patient like information on creating a medical advance directive?      No - Patient declined     Current Medications (verified) Outpatient Encounter Medications as of 03/10/2022  Medication Sig   acetaminophen (TYLENOL) 500 MG tablet Take 500-1,000 mg by mouth every 6 (six) hours as needed for mild pain, moderate pain or fever.    ALPRAZolam (XANAX) 1 MG tablet TAKE 1/2 TO 1 TABLET(0.5 TO 1 MG) BY MOUTH TWICE DAILY AS NEEDED FOR ANXIETY   BD PEN NEEDLE NANO 2ND GEN 32G X 4 MM MISC USE DAILY AS DIRECTED   Blood Glucose Monitoring Suppl (ONETOUCH VERIO) w/Device KIT Use as advised   buPROPion (WELLBUTRIN XL) 300 MG 24 hr tablet Take 1 tablet (300 mg total) by mouth daily.   Cholecalciferol (VITAMIN D3) 2000 units TABS Take 2,000 mcg by mouth daily.   Cyanocobalamin (B-12) 1000 MCG SUBL Place 1,000 mcg under the tongue every evening.    doxazosin (CARDURA) 2 MG tablet Take 1 tablet (2 mg total) by mouth every evening.   ELIQUIS 5 MG TABS tablet TAKE 1 TABLET(5 MG) BY MOUTH TWICE DAILY   esomeprazole (NEXIUM) 20 MG capsule Take 20 mg by mouth daily as needed (acid reflux symptoms). M-W-F   ezetimibe (ZETIA) 10 MG tablet Take 1 tablet (10 mg total) by mouth daily.  glipiZIDE (GLUCOTROL XL) 2.5 MG 24 hr tablet Take 1 tablet in the evening (Patient taking differently: 5 mg at bedtime.)   glucose blood (ONETOUCH VERIO) test strip USE AS DIRECTED TO TEST BLOOD SUGAR THREE TIMES DAILY   insulin glargine (LANTUS SOLOSTAR) 100 UNIT/ML Solostar Pen Inject 24 Units into the skin daily.   iron polysaccharides (NIFEREX) 150 MG capsule Take 150 mg by mouth at bedtime.    JARDIANCE 10 MG TABS tablet TAKE 1 TABLET(10 MG) BY MOUTH DAILY BEFORE BREAKFAST   Lancet Devices (LANCING DEVICE) MISC Use as advised -  Freestyle   Lancets (ONETOUCH DELICA PLUS [id number]) MISC CHECK BLOOD SUGAR THREE TIMES DAILY AS DIRECTED   lisinopril (ZESTRIL) 20 MG tablet Take 1 tablet (20 mg total) by mouth in the morning.   metoprolol succinate (TOPROL-XL) 50 MG 24 hr tablet Take with or immediately following a meal.   NONFORMULARY OR COMPOUNDED ITEM Apply 1 application topically 2 (two) times daily as needed (sun damage on face). FLUOROURACIL 5% + CALCIPOTRIENE 0.005%   simvastatin (ZOCOR) 40 MG tablet TAKE 1 TABLET(40 MG) BY MOUTH AT BEDTIME   triamcinolone cream (KENALOG) 0.1 % Apply 1 application topically 2 (two) times daily as needed (skin rash.).    No facility-administered encounter medications on file as of 03/10/2022.    Allergies (verified) Gadolinium derivatives, Nsaids, Amlodipine, and Contrast media [iodinated contrast media]   History: Past Medical History:  Diagnosis Date   Anxiety    Arthritis    Atrial fibrillation (HCC)    CLL (chronic lymphocytic leukemia) ([place])    Dx. 2019 Dr. 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 Particia Nearing first visit 11-2019   Past Surgical History:  Procedure Laterality Date   ABDOMINAL HYSTERECTOMY  1998   COLONOSCOPY      EYE SURGERY Bilateral    cataracts   EYE SURGERY Left    retina tear   FEMUR IM NAIL  10/25/2012   Procedure: INTRAMEDULLARY (IM) NAIL FEMORAL;  Surgeon: Mauri Pole, MD;  Location: [facility];  Service: Orthopedics;  Laterality: Left;   HIP SURGERY     Fracture L IM nail and 3 rods   left knee arthroscopy  12/2010   LYMPH NODE BIOPSY     axillary left 12-2018   REFRACTIVE SURGERY     skin cancer removed right and and lower forearm     squamoun in situ   TOTAL KNEE ARTHROPLASTY  12/20/2011   Procedure: TOTAL KNEE ARTHROPLASTY;  Surgeon: Gearlean Alf, MD;  Location: [facility];  Service: Orthopedics;  Laterality: Left;  Failed attempt at spinal    TOTAL SHOULDER ARTHROPLASTY Right 08/19/2016   Procedure: RIGHT TOTAL SHOULDER ARTHROPLASTY;  Surgeon: Justice Britain, MD;  Location: [facility];  Service: Orthopedics;  Laterality: Right;   TOTAL SHOULDER ARTHROPLASTY Left 01/10/2020   Procedure: TOTAL SHOULDER ARTHROPLASTY;  Surgeon: Justice Britain, MD;  Location: [facility];  Service: Orthopedics;  Laterality: Left;  152mn   Family History  Problem Relation Age of Onset   Cancer Mother        breast cancer   Cancer Father        plasma sarcoma   Breast cancer Neg Hx    Social History   Socioeconomic History   Marital status: Single    Spouse name: Not on file   Number of children: Not on file   Years of education: Not on file   Highest education level: Not on file  Occupational History   Occupation: RTherapist, 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    Employer: [employer]  Tobacco Use   Smoking status: Never   Smokeless tobacco: Never  Vaping Use   Vaping Use: Never used  Substance and Sexual Activity   Alcohol use: No   Drug use: No   Sexual activity: Not Currently  Other Topics Concern   Not on file  Social History Narrative   RN at [employer]            Social Determinants of Health   Financial Resource Strain: Low Risk    Difficulty of Paying Living Expenses: Not hard at all  Food Insecurity:  No Food Insecurity   Worried About RCharity fundraiserin the Last Year: Never true   RMilanin the Last Year: Never true  Transportation Needs: No Transportation Needs   Lack of Transportation (Medical): No   Lack of Transportation (Non-Medical): No  Physical Activity: Inactive   Days of Exercise per Week: 0 days   Minutes of Exercise per Session: 0 min  Stress: No Stress Concern Present   Feeling of Stress : Not at all  Social Connections: Moderately Isolated   Frequency of Communication with Friends and Family: More than three times a week   Frequency of Social Gatherings with Friends and Family: More than three times a week   Attends Religious Services: More than 4 times per year   Active Member of [employer] Organizations: No   Attends CMusic therapist Never   Marital Status: Never married    Tobacco Counseling Counseling given: Not Answered   Clinical Intake:  Pre-visit preparation completed: Yes  Pain : No/denies pain Pain Score: 0-No pain Pain Type: Chronic pain Pain Location: Back Pain Orientation: Mid, Lower Pain Descriptors / Indicators: Discomfort, Dull     Nutritional Risks: None Diabetes: Yes CBG done?: No Did pt. bring in CBG monitor from home?: No  How often do you need to have someone help you when you read instructions, pamphlets, or other written materials from your doctor or pharmacy?: 1 - Never What is the last grade level you completed in school?: Retired RTherapist, sports Diabetic? yes  Interpreter Needed?: No  Information entered by :: [employer] LPN.   Activities of Daily Living    03/10/2022   11:59 AM 10/16/2021   11:00 AM  In your present state of health, do you have any difficulty performing the following activities:  Hearing? 0 0  Vision? 0 0  Difficulty concentrating or making decisions? 0 0  Walking or climbing stairs? 0 0  Dressing or bathing? 0 0  Doing errands, shopping? 0 0  Preparing Food and eating ? N    Using the Toilet? N   In the past six months, have you accidently leaked urine? Y   Do you have problems with loss of bowel control? N   Managing your Medications? N   Managing your Finances? N   Housekeeping or managing your Housekeeping? N     Patient Care Team: [name], MD as PCP - General (Internal Medicine) Rockey Situ Kathlene November, MD as PCP - Cardiology (Cardiology) Minna Merritts, MD as Consulting Physician (Cardiology) Knox Royalty, RN as Case Manager  Indicate any recent Medical Services you may have received from other than [facility] providers in the past year (date may be approximate).     Assessment:   This is a routine wellness examination for Brenda [name].  Hearing/Vision screen Hearing Screening - Comments:: Patient denied any hearing difficulty.   No hearing aids.  Vision Screening - Comments:: Patient does not wear any corrective lenses/contacts; just readers for fine print.  Eye exam done by: Luberta Mutter, MD.   Dietary issues and exercise activities discussed: Current Exercise Habits: The patient does not participate in regular exercise at present, Exercise limited by: orthopedic condition(s)   Goals Addressed             This Visit's Progress    My goal is to get some relief from my back pain.        Depression Screen    03/10/2022   11:51 AM 01/28/2022   10:00 AM 10/16/2021   11:00 AM 05/20/2021   12:02 PM 10/14/2020    3:12 PM 05/15/2020   10:26 AM 05/02/2019    8:29 AM  PHQ 2/9 Scores  PHQ - 2 Score 0 1  5 1 3 1   PHQ- 9 Score    12  10 4   Exception Documentation   Other- indicate reason in comment box      Not completed   chronic issue.        Fall Risk    03/10/2022   11:35 AM 01/28/2022   10:00 AM 08/12/2021    9:35 AM 05/20/2021   12:01 PM 10/14/2020    3:11 PM  [place] in the past year? 1 1 1 1 1   Number falls in past yr: 1 1 1 1  0  Injury with Fall? 1 1 1 1 1   Comment  soreness only- no serious injury/  hospitalization Patient states bruised ribs and abrasions    Risk for fall due to : History of fall(s);Medication side effect;Orthopedic patient History of fall(s);Medication side effect;Orthopedic patient History of fall(s) Other (Comment) History of fall(s)  Risk for fall due to: Comment    patient states she was dehydrated, blood pressure dropped and acute renal failure   Follow up Falls prevention discussed Falls prevention discussed Falls prevention discussed Falls prevention discussed Falls prevention discussed    FALL RISK PREVENTION PERTAINING TO THE HOME:  Any stairs in or around the home? No  If so, are there any without handrails? No  Home free of loose throw rugs in walkways, pet beds, electrical cords, etc? Yes  Adequate lighting in your home to reduce risk of falls? Yes   ASSISTIVE DEVICES UTILIZED TO PREVENT FALLS:  Life alert? No  Use of a cane, walker or w/c? No  Grab bars in the bathroom? Yes  Shower chair or bench in shower? No  Elevated toilet seat or a handicapped toilet? No   TIMED UP AND GO:  Was the test performed? No .  Length of time to ambulate 10 feet: n/a sec.   Appearance of gait: Patient not evaluated for gait during this visit.  Cognitive Function:        03/10/2022   12:00 PM  6CIT Screen  What Year? 0 points  What month? 0 points  What time? 0 points  Count back from 20 0 points  Months in reverse 0 points  Repeat phrase 0 points  Total Score 0 points    Immunizations Immunization History  Administered Date(s) Administered   Fluad Quad(high Dose 65+) 06/27/2019, 07/16/2020, 07/10/2021   Influenza,inj,quad, With Preservative 07/28/2017, 08/01/2018, 08/02/2019   Influenza-Unspecified 08/01/2014, 07/31/2016   PFIZER(Purple Top)SARS-COV-2 Vaccination 12/31/2019, 01/21/2020, 07/31/2020, 02/09/2021   Pfizer Covid-19 Vaccine Bivalent Booster 52yr & up 08/04/2021   Pneumococcal Conjugate-13 10/01/2014   Pneumococcal Polysaccharide-23  11/03/2012, 12/19/2017   Td 11/12/2009   Tdap 05/15/2020   Zoster Recombinat (Shingrix) 05/15/2020, 07/16/2020   Zoster, Live 12/14/2010    TDAP status: Up to date  Flu Vaccine status: Up to date  Pneumococcal vaccine status: Up to date  Covid-19 vaccine status: Completed vaccines  Qualifies for Shingles Vaccine? Yes   Zostavax completed Yes   Shingrix Completed?: Yes  Screening Tests Health Maintenance  Topic Date Due   OPHTHALMOLOGY EXAM  03/24/2022   HEMOGLOBIN A1C  05/20/2022   INFLUENZA VACCINE  06/01/2022   MAMMOGRAM  09/28/2022   FOOT EXAM  10/16/2022   COLONOSCOPY (Pts 45-462yrInsurance coverage will need to be confirmed)  08/31/2028   TETANUS/TDAP  05/15/2030   Pneumonia Vaccine 6559Years old  Completed   DEXA SCAN  Completed   COVID-19 Vaccine  Completed   Hepatitis C Screening  Completed   Zoster Vaccines- Shingrix  Completed   HPV VACCINES  Aged Out    Health Maintenance  There are no preventive care reminders to display for this patient.  Colorectal cancer screening: Type of screening: Colonoscopy. Completed 08/31/2018. Repeat every 10 years  Mammogram status: Completed 09/28/2021. Repeat every year  Bone Density status: Completed 05/21/2020. Results reflect: Bone density results: OSTEOPENIA. Repeat every 2-3 years.  Lung Cancer Screening: (Low Dose CT Chest recommended if Age 73-80ears, 30 pack-year currently smoking OR have quit w/in 15years.) does not qualify.   Lung Cancer Screening Referral: no  Additional Screening:  Hepatitis C Screening: does qualify; Completed: 07/16/2015  Vision Screening: Recommended annual ophthalmology exams for early detection of glaucoma and other disorders of the eye. Is the patient up to date with their annual eye exam?  Yes  Who is the provider or what is the name of the office in which the patient attends annual eye exams? [facility] If pt is not established with a provider, would they like to be  referred to a provider to establish care? No .   Dental Screening: Recommended annual dental exams for proper oral hygiene  Community Resource Referral / Chronic Care Management: CRR required this visit?  No   CCM required this visit?  No      Plan:     I have personally reviewed and noted the following in the patient's chart:   Medical and social history Use of alcohol, tobacco or illicit drugs  Current medications and supplements including opioid prescriptions.  Functional ability and status  Nutritional status Physical activity Advanced directives List of other physicians Hospitalizations, surgeries, and ER visits in previous 12 months Vitals Screenings to include cognitive, depression, and falls Referrals and appointments  In addition, I have reviewed and discussed with patient certain preventive protocols, quality metrics, and best practice recommendations. A written personalized care plan for preventive services as well as general preventive health recommendations were provided to patient.     Brenda Flow, LPN   5/80/9983   Nurse Notes:  There were no vitals filed for this visit. There is no height or weight on file to calculate BMI.  Medical screening examination/treatment/procedure(s) were performed by non-physician practitioner and as supervising physician I was immediately available for consultation/collaboration.  I agree with above. Lew Dawes, MD

## 2022-03-23 ENCOUNTER — Ambulatory Visit (INDEPENDENT_AMBULATORY_CARE_PROVIDER_SITE_OTHER): Payer: Medicare Other | Admitting: Internal Medicine

## 2022-03-23 ENCOUNTER — Encounter: Payer: Self-pay | Admitting: Internal Medicine

## 2022-03-23 VITALS — BP 130/68 | HR 67 | Ht 61.0 in | Wt 196.4 lb

## 2022-03-23 DIAGNOSIS — E785 Hyperlipidemia, unspecified: Secondary | ICD-10-CM

## 2022-03-23 DIAGNOSIS — I25118 Atherosclerotic heart disease of native coronary artery with other forms of angina pectoris: Secondary | ICD-10-CM

## 2022-03-23 DIAGNOSIS — Z6835 Body mass index (BMI) 35.0-35.9, adult: Secondary | ICD-10-CM

## 2022-03-23 DIAGNOSIS — E1165 Type 2 diabetes mellitus with hyperglycemia: Secondary | ICD-10-CM

## 2022-03-23 DIAGNOSIS — E1169 Type 2 diabetes mellitus with other specified complication: Secondary | ICD-10-CM | POA: Diagnosis not present

## 2022-03-23 LAB — POCT GLYCOSYLATED HEMOGLOBIN (HGB A1C): Hemoglobin A1C: 6.9 % — AB (ref 4.0–5.6)

## 2022-03-23 NOTE — Progress Notes (Signed)
Patient ID: Brenda [name], female   DOB: January 17, 1951, 71 y.o.   MRN: [id number]  This visit occurred during the SARS-CoV-2 public health emergency.  Safety protocols were in place, including screening questions prior to the visit, additional usage of staff PPE, and extensive cleaning of exam room while observing appropriate contact time as indicated for disinfecting solutions.   HPI: Brenda [name] is a 71 y.o.-year-old female, presenting for follow-up for DM2, dx in 2007, insulin-independent, uncontrolled, with complications (CKD). Last visit 4 months ago.  Interim history: She has increased urination, but no blurry vision, nausea, chest pain. She has joint pains, including pain in the back.  She cannot get any more steroid injections due to her diabetes.  She is thinking about starting physical therapy.  She cannot exercise because of the pain.  She gained weight since last visit. She has lifelong insomnia.  In the past, she was waking up at 3 AM to go to work at 5:30 AM.  Reviewed HbA1c levels: Lab Results  Component Value Date   HGBA1C 7.0 (A) 11/20/2021   HGBA1C 6.5 (A) 07/17/2021   HGBA1C 7.0 (A) 03/11/2021   Patient is on: - Lantus 12 units in a.m.- started 05/2019 by PCP >> 24 units after dinner - Glipizide XR  2.5 mg in am and 5 mg before a large dinner >> 5 >> 2.5 >> 2.5-5 mg before dinner  - Farxiga 5 mg >> Jardiance 10 before breakfast-added 11/2020 She was on Glipizide in 11/2015 after a steroid inj. We stopped Victoza due to nausea. Metformin ER was stopped by PCP due to CKD. Glipizide ER was stopped 06/2019 due to low blood sugars.  Pt checks her sugars twice a day: - am: 80-134, 145, 150 >> 74-136 (140-160) >> 85, 94-140, 160 - 2h after b'fast: 156 >> n/c >> 148 >> n/c - before lunch: 58, 64-113, 126 >> 63, 81-140 >> 71-142, 160 - 2h after lunch: 118, 123 >> 92, 99 >> n/c >> 145 >> n/c - before dinner: 82-148 >> 90-164 >> 80, 99-157, 160, 170 - 2h after dinner:n/c  >> 138, 148 >> n/c - bedtime:  149-164 >> 130-140 >> 137, 142 >> n/c - nighttime: n/c Lowest sugar was 37 x1 >> ... 58 >> 63 x1 >> 60; she has hypoglycemia awareness in the 70s. Highest sugar was 200 >> 160 >> 164 >> 188  Glucometer: Freestyle Lite  Pt's meals are: - Breakfast: English muffin + preserve or cereal or yoghurt - snack: fruit or fruit + yoghurt - Lunch: 1/2 sandwich + chips + salsa + fruit/sugar free >> protein bar - Dinner: soup or chicken/steak + veggies, occasional starch or Lean cuisine or light salad No soft drinks.  -+ CKD, last BUN/creatinine:  Lab Results  Component Value Date   BUN 40 (H) 11/12/2021   CREATININE 2.04 (H) 11/12/2021  On benazepril.  Sees nephrology. She had a high potassium (5.9) >> used Kayexalate x3 >> normalized.  She is on a low potassium diet.  -+ HL last set of lipids: Lab Results  Component Value Date   CHOL 124 07/20/2021   HDL 29 (L) 07/20/2021   LDLCALC 73 07/20/2021   LDLDIRECT 55.0 05/15/2020   TRIG 122 07/20/2021   CHOLHDL 4.3 07/20/2021  On Zocor and Zetia.  - last eye exam 03/2021: No DR; she had cataract surgery in 2016.  She has a history of retinal tear. Coming up next week.  - no numbness and tingling in her  feet.  She takes Neurontin for leg cramps at night.  She also has atrial fibrillation, HTN, GERD, depression. She was admitted 11/2017 for dehydration, anemia, and acute renal failure >> diagnosed with CLL.  ROS: + see HPI  I reviewed pt's medications, allergies, PMH, social hx, family hx, and changes were documented in the history of present illness. Otherwise, unchanged from my initial visit note.  Past Medical History:  Diagnosis Date   Anxiety    Arthritis    Atrial fibrillation (HCC)    CLL (chronic lymphocytic leukemia) ([place])    Dx. 2019 Dr. 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 Particia Nearing first visit 11-2019   Past Surgical History:  Procedure Laterality Date   ABDOMINAL HYSTERECTOMY  1998   COLONOSCOPY     EYE SURGERY Bilateral    cataracts   EYE SURGERY Left    retina tear   FEMUR IM NAIL  10/25/2012   Procedure: INTRAMEDULLARY (IM) NAIL FEMORAL;  Surgeon: Mauri Pole, MD;  Location: [facility];  Service: Orthopedics;  Laterality: Left;   HIP SURGERY     Fracture L IM nail and 3 rods   left knee arthroscopy  12/2010   LYMPH NODE BIOPSY     axillary left 12-2018   REFRACTIVE SURGERY     skin cancer removed right and and lower forearm     squamoun in situ   TOTAL KNEE ARTHROPLASTY  12/20/2011   Procedure: TOTAL KNEE ARTHROPLASTY;  Surgeon: Gearlean Alf, MD;  Location: [facility];  Service: Orthopedics;  Laterality: Left;  Failed attempt at spinal    TOTAL SHOULDER ARTHROPLASTY Right 08/19/2016   Procedure: RIGHT TOTAL SHOULDER ARTHROPLASTY;  Surgeon: Justice Britain, MD;  Location: [place];  Service: Orthopedics;  Laterality: Right;   TOTAL SHOULDER ARTHROPLASTY Left 01/10/2020   Procedure: TOTAL SHOULDER ARTHROPLASTY;  Surgeon: Justice Britain, MD;  Location: [facility];  Service: Orthopedics;  Laterality: Left;  166mn   Social History   Social History   Marital Status: Single    Spouse Name: N/A   Number of Children: 0    Occupational History   RTherapist, sportsat [employer]    Social History Main Topics   Smoking status: Never Smoker    Smokeless tobacco: Never Used   Alcohol Use: No   Drug Use: No   Social History NAdult nurseat [employer]   Current Outpatient Medications on File Prior to Visit  Medication Sig Dispense Refill   acetaminophen (TYLENOL) 500 MG tablet  Take 500-1,000 mg by mouth every 6 (six) hours as needed for mild pain, moderate pain or fever.      ALPRAZolam (XANAX) 1 MG tablet TAKE 1/2 TO 1 TABLET(0.5 TO 1 MG) BY MOUTH TWICE DAILY AS NEEDED FOR ANXIETY 60 tablet 2   BD PEN NEEDLE NANO 2ND GEN 32G X 4 MM MISC USE DAILY AS DIRECTED 100 each 3   Blood Glucose Monitoring Suppl (ONETOUCH VERIO) w/Device KIT Use as advised 1 kit 0   buPROPion (WELLBUTRIN XL) 300 MG 24 hr tablet Take 1 tablet (300 mg total) by mouth daily. 90 tablet 3   Cholecalciferol (VITAMIN D3) 2000 units TABS Take 2,000 mcg by mouth daily.     Cyanocobalamin (B-12) 1000 MCG SUBL Place 1,000 mcg under the tongue every evening.      doxazosin (CARDURA) 2 MG tablet Take 1 tablet (2 mg total) by mouth every evening. 90 tablet 3   ELIQUIS 5 MG TABS tablet TAKE 1 TABLET(5 MG) BY MOUTH TWICE DAILY 60 tablet 5   esomeprazole (NEXIUM) 20 MG capsule Take 20 mg by mouth daily as needed (acid reflux symptoms). M-W-F     ezetimibe (ZETIA) 10 MG tablet Take 1 tablet (10 mg total) by mouth daily. 90 tablet 3   glipiZIDE (GLUCOTROL XL) 2.5 MG 24 hr tablet Take 1 tablet in the evening (Patient taking differently: 5 mg at bedtime.) 90 tablet 3   glucose blood (ONETOUCH VERIO) test strip USE AS DIRECTED TO TEST BLOOD SUGAR THREE TIMES DAILY 300 strip 1   insulin glargine (LANTUS SOLOSTAR) 100 UNIT/ML Solostar Pen Inject 24 Units into the skin daily. 30 mL 3   iron polysaccharides (NIFEREX) 150 MG capsule Take 150 mg by mouth at bedtime.      JARDIANCE 10 MG TABS tablet TAKE 1 TABLET(10 MG) BY MOUTH DAILY BEFORE BREAKFAST 30 tablet 11    Lancet Devices (LANCING DEVICE) MISC Use as advised - Freestyle 1 each 0   Lancets (ONETOUCH DELICA PLUS [id number]) MISC CHECK BLOOD SUGAR THREE TIMES DAILY AS DIRECTED 300 each 1   lisinopril (ZESTRIL) 20 MG tablet Take 1 tablet (20 mg total) by mouth in the morning. 90 tablet 3   metoprolol succinate (TOPROL-XL) 50 MG 24 hr tablet Take with or immediately following a meal. 180 tablet 3   NONFORMULARY OR COMPOUNDED ITEM Apply 1 application topically 2 (two) times daily as needed (sun damage on face). FLUOROURACIL 5% + CALCIPOTRIENE 0.005%     simvastatin (ZOCOR) 40 MG tablet TAKE 1 TABLET(40 MG) BY MOUTH AT BEDTIME 90 tablet 3   triamcinolone cream (KENALOG) 0.1 % Apply 1 application topically 2 (two) times daily as needed (skin rash.).      No current facility-administered medications on file prior to visit.   Allergies  Allergen Reactions   Gadolinium Derivatives Other (See Comments)   Nsaids Other (See Comments)    Due to kidney function   Amlodipine     Swelling     Contrast Media [Iodinated Contrast Media] Other (See Comments)    Patient states unable to take / use due to kidney function   Family History  Problem Relation Age of Onset   Cancer Mother        breast cancer   Cancer Father        plasma sarcoma   Breast cancer Neg Hx    PE: BP 130/68 (BP Location: Left Arm, Patient Position: Sitting, Cuff Size: Normal)   Pulse 67   Ht  5' 1"  (1.549 m)   Wt 196 lb 6.4 oz (89.1 kg)   SpO2 94%   BMI 37.11 kg/m   Wt Readings from Last 3 Encounters:  03/23/22 196 lb 6.4 oz (89.1 kg)  01/18/22 194 lb (88 kg)  11/20/21 191 lb (86.6 kg)   Constitutional: overweight, in NAD Eyes: PERRLA, EOMI, no exophthalmos ENT: moist mucous membranes, no thyromegaly, no cervical lymphadenopathy Cardiovascular: RRR, No MRG, + LLE edema-chronic, L>R Respiratory: CTA B Musculoskeletal: no deformities, strength intact in all 4 Skin: moist, warm, no rashes Neurological: no tremor with  outstretched hands, DTR normal in all 4 Diabetic Foot Exam - Simple   Simple Foot Form Diabetic Foot exam was performed with the following findings: Yes 03/23/2022  9:12 AM  Visual Inspection No deformities, no ulcerations, no other skin breakdown bilaterally: Yes Sensation Testing Intact to touch and monofilament testing bilaterally: Yes Pulse Check See comments: Yes Comments Pulses not palpable 2/2 significant pitting LE edema L>R.    ASSESSMENT: 1. DM2, insulin-dependent, uncontrolled, with complications  - mild CKD  2. Obesity class 2  3. HL  PLAN:  1. Patient with longstanding, uncontrolled, type 2 diabetes, with much improved control after addition of insulin in summer 2020.  At that time, metformin was stopped due to worsening kidney function.  She was on Victoza in the past but she could not tolerate it due to nausea.  She was reticent to try another GLP-1 receptor agonist afterwards.  At last visit, we adjusted the dose of glipizide, but blood sugars were mostly at goal, without significant lows, but otherwise, we did not change her regimen.  HbA1c at that time was slightly higher, at 7.0%. -At this visit, sugars are mostly at goal, with mild hyperglycemic exceptions especially in the morning and before dinner.  She mentions that if she only takes 1 glipizide tablet before dinner, sugars are higher in the morning, around 140s.  Says sugars before dinner are also at or slightly above target, we discussed about splitting the dinnertime glipizide dose into 2 doses: Before lunch and before dinner.  Otherwise, we can continue the rest of the regimen. -At today's visit we discussed about possibly starting Ozempic.  However, she is in the donut hole and can barely afford her Jardiance and Eliquis.   - I suggested to:  Patient Instructions  Please change: - Glipizide XR 2.5 mg before lunch and dinner  Continue: - Jardiance 10 mg before b'fast  - Lantus 24 units after dinner  Please  return in 4 months with your sugar log.  - we checked her HbA1c: 6.9% (lower) - advised to check sugars at different times of the day - 1-2x a day, rotating check times - advised for yearly eye exams >> she is UTD - return to clinic in 4 months    2. Obesity class 2 -She has problems eating the right foods, since she also has to limit potassium -We will continue Jardiance which should also help with weight loss -She gained 8 pounds before the last 2 visits combined - since last OV, she gained 5 lbs -At this visit we discussed that she continues to gain weight and unfortunately this puts more strain on her back.  She cannot exercise due to the pain and thinking about physical therapy.  I strongly advised her to do so.  She mentions that she gets severe back pain if she stands for 2 to 3 hours.  I advised her to try to exercise for  shorter periods of time and also to do some chair exercises -She is reticent to try again a GLP-1 receptor agonist, not only due to previous side effects to Victoza but also due to price.  3.  Hyperlipidemia -Reviewed latest lipid panel from 07/2021: LDL above target of less than 70, HDL low: Lab Results  Component Value Date   CHOL 124 07/20/2021   HDL 29 (L) 07/20/2021   LDLCALC 73 07/20/2021   LDLDIRECT 55.0 05/15/2020   TRIG 122 07/20/2021   CHOLHDL 4.3 07/20/2021  -She continues on Zocor 40 mg daily and Zetia 10 mg daily without side effects  Philemon Kingdom, MD PhD [facility]

## 2022-03-23 NOTE — Patient Instructions (Addendum)
Please change: - Glipizide XR 2.5 mg before lunch and dinner  Continue: - Jardiance 10 mg before b'fast  - Lantus 24 units after dinner  Please return in 4 months with your sugar log.

## 2022-03-28 ENCOUNTER — Other Ambulatory Visit: Payer: Self-pay | Admitting: Cardiovascular Disease

## 2022-03-30 DIAGNOSIS — Z961 Presence of intraocular lens: Secondary | ICD-10-CM | POA: Diagnosis not present

## 2022-03-30 DIAGNOSIS — E119 Type 2 diabetes mellitus without complications: Secondary | ICD-10-CM | POA: Diagnosis not present

## 2022-03-30 LAB — HM DIABETES EYE EXAM

## 2022-04-01 ENCOUNTER — Encounter: Payer: Self-pay | Admitting: Internal Medicine

## 2022-04-11 ENCOUNTER — Encounter: Payer: Self-pay | Admitting: Internal Medicine

## 2022-04-11 DIAGNOSIS — E1165 Type 2 diabetes mellitus with hyperglycemia: Secondary | ICD-10-CM

## 2022-04-12 ENCOUNTER — Encounter: Payer: Self-pay | Admitting: Cardiovascular Disease

## 2022-04-12 MED ORDER — GLIPIZIDE ER 2.5 MG PO TB24: 2.5000 mg | ORAL_TABLET | Freq: Two times a day (BID) | ORAL | 1 refills | Status: DC

## 2022-04-16 ENCOUNTER — Encounter: Payer: Self-pay | Admitting: Cardiovascular Disease

## 2022-04-16 ENCOUNTER — Ambulatory Visit (INDEPENDENT_AMBULATORY_CARE_PROVIDER_SITE_OTHER): Payer: Medicare Other | Admitting: Internal Medicine

## 2022-04-16 ENCOUNTER — Other Ambulatory Visit: Payer: Self-pay | Admitting: Internal Medicine

## 2022-04-16 ENCOUNTER — Encounter: Payer: Self-pay | Admitting: Internal Medicine

## 2022-04-16 DIAGNOSIS — I25118 Atherosclerotic heart disease of native coronary artery with other forms of angina pectoris: Secondary | ICD-10-CM

## 2022-04-16 DIAGNOSIS — E118 Type 2 diabetes mellitus with unspecified complications: Secondary | ICD-10-CM

## 2022-04-16 DIAGNOSIS — M5416 Radiculopathy, lumbar region: Secondary | ICD-10-CM

## 2022-04-16 DIAGNOSIS — G8929 Other chronic pain: Secondary | ICD-10-CM

## 2022-04-16 DIAGNOSIS — C9110 Chronic lymphocytic leukemia of B-cell type not having achieved remission: Secondary | ICD-10-CM

## 2022-04-16 DIAGNOSIS — N1832 Chronic kidney disease, stage 3b: Secondary | ICD-10-CM | POA: Diagnosis not present

## 2022-04-16 NOTE — Assessment & Plan Note (Signed)
She does not have any new symptoms of progression such as night sweats or weight loss. She has labs with oncology every 6 months and is due next month. Will defer labs today.

## 2022-04-16 NOTE — Assessment & Plan Note (Signed)
BMI 36 but complicated by diabetes, arthritis, hypertension and hyperlipidemia. She is limited in activity lately which is causing worsening of her weight.

## 2022-04-16 NOTE — Progress Notes (Signed)
   Subjective:   Patient ID: Brenda [name], female    DOB: [birth year], 71 y.o.   MRN: [id number]  HPI The patient is a 71 YO female coming in for concerns lately.   Review of Systems  Constitutional:  Positive for fatigue.  HENT: Negative.    Eyes: Negative.   Respiratory:  Negative for cough, chest tightness and shortness of breath.   Cardiovascular:  Positive for palpitations. Negative for chest pain and leg swelling.  Gastrointestinal:  Negative for abdominal distention, abdominal pain, constipation, diarrhea, nausea and vomiting.  Musculoskeletal:  Positive for arthralgias, back pain, gait problem and myalgias.  Skin: Negative.   Psychiatric/Behavioral: Negative.      Objective:  Physical Exam Constitutional:      Appearance: She is well-developed. She is obese.  HENT:     Head: Normocephalic and atraumatic.  Cardiovascular:     Rate and Rhythm: Normal rate. Rhythm irregular.  Pulmonary:     Effort: Pulmonary effort is normal. No respiratory distress.     Breath sounds: Normal breath sounds. No wheezing or rales.  Abdominal:     General: Bowel sounds are normal. There is no distension.     Palpations: Abdomen is soft.     Tenderness: There is no abdominal tenderness. There is no rebound.  Musculoskeletal:        General: Swelling and tenderness present.     Cervical back: Normal range of motion.  Skin:    General: Skin is warm and dry.  Neurological:     Mental Status: She is alert and oriented to person, place, and time.     Coordination: Coordination abnormal.     Comments: Slow to stand and slow gait     Vitals:   04/16/22 1053  BP: 132/74  Pulse: (!) 102  Temp: 98.3 F (36.8 C)  TempSrc: Oral  SpO2: 94%  Weight: 191 lb 3 oz (86.7 kg)    Assessment & Plan:

## 2022-04-16 NOTE — Assessment & Plan Note (Signed)
We did discuss most recent labs and she had this rechecked with nephrology and this was improved Cr 1.8 and she gets monitoring every 4 months with them. Reviewed avoiding NSAIDs and she is aware. BP at goal and DM at goal.

## 2022-04-16 NOTE — Assessment & Plan Note (Signed)
This is worsening and her activity tolerance is limited. She is seeing Dr. Nelva Bush tomorrow and we talked about her trying PT if he does not have any good options.

## 2022-04-16 NOTE — Patient Instructions (Signed)
We will follow up the labs.

## 2022-04-16 NOTE — Assessment & Plan Note (Signed)
Stable with HgA1c 6.9%. It is unclear how accurate this is with her GFR but reported values are at goal mostly as well. We discussed that if she needed steroid injection in her foot or back this could cause temporary sugar elevations and she did have sugars to 300 after a shoulder injection a few years ago. This is making her hesitant about any injections.

## 2022-04-17 DIAGNOSIS — E785 Hyperlipidemia, unspecified: Secondary | ICD-10-CM | POA: Diagnosis not present

## 2022-04-17 DIAGNOSIS — D6869 Other thrombophilia: Secondary | ICD-10-CM | POA: Diagnosis not present

## 2022-04-17 DIAGNOSIS — I700 Atherosclerosis of aorta: Secondary | ICD-10-CM | POA: Diagnosis not present

## 2022-04-17 DIAGNOSIS — M47896 Other spondylosis, lumbar region: Secondary | ICD-10-CM | POA: Diagnosis not present

## 2022-04-17 DIAGNOSIS — I4891 Unspecified atrial fibrillation: Secondary | ICD-10-CM | POA: Diagnosis not present

## 2022-04-19 ENCOUNTER — Ambulatory Visit (INDEPENDENT_AMBULATORY_CARE_PROVIDER_SITE_OTHER): Payer: Medicare Other | Admitting: Cardiovascular Disease

## 2022-04-19 ENCOUNTER — Encounter: Payer: Self-pay | Admitting: Cardiovascular Disease

## 2022-04-19 ENCOUNTER — Other Ambulatory Visit
Admission: RE | Admit: 2022-04-19 | Discharge: 2022-04-19 | Disposition: A | Discharge status: Home / Self Care | Payer: Medicare Other | Source: Ambulatory Visit | Attending: Cardiovascular Disease | Admitting: Cardiovascular Disease

## 2022-04-19 VITALS — BP 132/80 | HR 113 | Ht 60.0 in | Wt 192.6 lb

## 2022-04-19 DIAGNOSIS — I4891 Unspecified atrial fibrillation: Secondary | ICD-10-CM | POA: Diagnosis not present

## 2022-04-19 DIAGNOSIS — I25118 Atherosclerotic heart disease of native coronary artery with other forms of angina pectoris: Secondary | ICD-10-CM

## 2022-04-19 LAB — TSH: TSH: 2.453 u[IU]/mL (ref 0.350–4.500)

## 2022-04-19 MED ORDER — AMIODARONE HCL 200 MG PO TABS: ORAL_TABLET | ORAL | 0 refills | Status: DC

## 2022-04-19 MED ORDER — METOPROLOL SUCCINATE ER 100 MG PO TB24: 100.0000 mg | ORAL_TABLET | Freq: Every day | ORAL | 3 refills | Status: DC

## 2022-04-19 NOTE — Progress Notes (Signed)
Patient ID: Brenda [name], female   DOB: 1950-12-20, 71 y.o.   MRN: [id number] Cardiology Office Note  Date:  04/19/2022   ID:  Brenda [name], Brenda [name] 04/26/1951, MRN [id number]  PCP:  Brenda Koch, MD   Chief Complaint  Patient presents with   Follow-up    Patient c/o shortness of breath with irregular heart beats. Medications reviewed by the patient verbally.     HPI:  Brenda [name] is a 71 year old woman with history of coronary artery disease, CT coronary calcium score 1031 obesity, diabetes type II,  poorly controlled, hemoglobin A1c previously 12 ,  hyperlipidemia,  with previous stress test and echocardiogram for abnormal EKG,  CLL s/p shoulder replacement surgery Chronic renal sufficiency/nephropathy who presents for routine followup of her coronary artery disease , atrial fibrillation hyperlipidemia.  Last seen in clinic 3/23 At that time was maintaining normal sinus rhythm on metoprolol succinate 50 twice daily Recent onset of atrial fibrillation past week or two We recommended increasing metoprolol 100 BID for atrial fibrillation in an effort to restore normal sinus rhythm Atrial fibrillation has persistent with symptoms of chest fullness, shortness of breath, general malaise Compliant with eliquis Rate continues to run fast on measurements at home Blood pressure relatively well controlled  EKG personally reviewed by myself on todays visit Atrial fibrillation ventricular rate 113 bpm left axis deviation  Other past medical history reviewed  atrial fibrillation on office visit May 2022, had mild shortness of breath A-fib on EKG Was started on Eliquis at that time, metoprolol succinate 50 twice daily   Chronic back pain  CBC : HGB 12.2 CR 1.5 up to 2.0 A1C 7.0 up from 6.5 WBC chronically elevated, 15.9 On simvastatin and Zetia Total cholesterol 124 LDL 73  Other past medical hx reviewed In hospital 06/2020,   fall, fractured ribs, dehydration, acute  renal failure  weakness and dizziness  acute kidney injury and has a serum creatinine of 4.63 compared to baseline of 1.67. Fever of 101.  So far cultures are negative.  Stool for C. difficile negative.  Patient on Rocephin and Flagyl Treated with midodrine  follow-up with Dr. Hollie [name] from [place] [facility]  PT recommended home with home services 24-hour supervision Was very SOB for weeks after she got home   hospital 11/2017 Dehydrated Lung nodules, enlarged lymph nodes Anemic  HBG 6.6 after hydration, iron def, stool negative BP was low. 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  PSH:    Past Surgical History:  Procedure Laterality Date   ABDOMINAL HYSTERECTOMY  1998   COLONOSCOPY     EYE SURGERY Bilateral    cataracts   EYE SURGERY Left    retina tear   FEMUR IM NAIL  10/25/2012   Procedure: INTRAMEDULLARY (IM) NAIL FEMORAL;  Surgeon: Brenda Pole, MD;  Location: [facility];  Service: Orthopedics;  Laterality: Left;   HIP SURGERY     Fracture L IM nail and 3 rods   left knee arthroscopy  12/2010   LYMPH NODE BIOPSY  axillary left 12-2018   REFRACTIVE SURGERY     skin cancer removed right and and lower forearm     squamoun in situ   TOTAL KNEE ARTHROPLASTY  12/20/2011   Procedure: TOTAL KNEE ARTHROPLASTY;  Surgeon: Brenda Alf, MD;  Location: [facility];  Service: Orthopedics;  Laterality: Left;  Failed attempt at spinal     TOTAL SHOULDER ARTHROPLASTY Right 08/19/2016   Procedure: RIGHT TOTAL SHOULDER ARTHROPLASTY;  Surgeon: Brenda Britain, MD;  Location: [facility];  Service: Orthopedics;  Laterality: Right;   TOTAL SHOULDER ARTHROPLASTY Left 01/10/2020   Procedure: TOTAL SHOULDER ARTHROPLASTY;  Surgeon: Brenda Britain, MD;  Location: [facility];  Service: Orthopedics;  Laterality: Left;  143mn    Current Outpatient Medications  Medication Sig Dispense Refill   acetaminophen (TYLENOL) 500 MG tablet Take 500-1,000 mg by mouth every 6 (six) hours as needed for mild pain, moderate pain or fever.      ALPRAZolam (XANAX) 1 MG tablet TAKE 1/2 TO 1 TABLET(0.5 TO 1 MG) BY MOUTH TWICE DAILY AS NEEDED FOR ANXIETY 60 tablet 5   BD PEN NEEDLE NANO 2ND GEN 32G X 4 MM MISC USE DAILY AS DIRECTED 100 each 3   Blood Glucose Monitoring Suppl (ONETOUCH VERIO) w/Device KIT Use as advised 1 kit 0   buPROPion (WELLBUTRIN XL) 300 MG 24 hr tablet Take 1 tablet (300 mg total) by mouth daily. 90 tablet 3   Cholecalciferol (VITAMIN D3) 2000 units TABS Take 2,000 mcg by mouth daily.     Cyanocobalamin (B-12) 1000 MCG SUBL Place 1,000 mcg under the tongue every evening.      ELIQUIS 5 MG TABS tablet TAKE 1 TABLET(5 MG) BY MOUTH TWICE DAILY 60 tablet 5   esomeprazole (NEXIUM) 20 MG capsule Take 20 mg by mouth daily as needed (acid reflux symptoms). M-W-F     ezetimibe (ZETIA) 10 MG tablet Take 1 tablet (10 mg total) by mouth daily. 90 tablet 3   glipiZIDE (GLUCOTROL XL) 2.5 MG 24 hr tablet Take 1 tablet (2.5 mg total) by mouth 2 (two) times daily at 8 am and 10 pm. 180 tablet 1   glucose blood (ONETOUCH VERIO) test strip USE AS DIRECTED TO TEST BLOOD SUGAR THREE TIMES DAILY 300 strip 1   insulin glargine (LANTUS SOLOSTAR) 100 UNIT/ML Solostar Pen Inject 24 Units into the skin daily. 30 mL 3   iron polysaccharides (NIFEREX) 150 MG capsule Take 150 mg by mouth at bedtime.      JARDIANCE 10 MG TABS tablet TAKE 1 TABLET(10 MG) BY MOUTH DAILY BEFORE  BREAKFAST 30 tablet 11   Lancet Devices (LANCING DEVICE) MISC Use as advised - Freestyle 1 each 0   Lancets (ONETOUCH DELICA PLUS [id number] MISC CHECK BLOOD SUGAR THREE TIMES DAILY AS DIRECTED 300 each 1   lisinopril (ZESTRIL) 20 MG tablet Take 1 tablet (20 mg total) by mouth in the morning. 90 tablet 3   metoprolol succinate (TOPROL-XL) 50 MG 24 hr tablet Take with or immediately following a meal. (Patient taking differently: 100 mg 2 (two) times daily. Take with or immediately following a meal.) 180 tablet 3   NONFORMULARY OR COMPOUNDED ITEM Apply 1 application topically 2 (two) times daily as needed (sun damage on face). FLUOROURACIL 5% + CALCIPOTRIENE 0.005%     simvastatin (ZOCOR) 40 MG tablet TAKE 1 TABLET(40 MG) BY MOUTH AT BEDTIME 90 tablet 3   triamcinolone cream (KENALOG) 0.1 % Apply 1 application topically 2 (two) times daily as needed (skin  rash.).      doxazosin (CARDURA) 2 MG tablet Take 1 tablet (2 mg total) by mouth every evening. (Patient not taking: Reported on 04/16/2022) 90 tablet 3   traMADol (ULTRAM) 50 MG tablet Take 50 mg by mouth 3 (three) times daily as needed. (Patient not taking: Reported on 04/19/2022)     No current facility-administered medications for this visit.    Allergies:   Gadolinium derivatives, Nsaids, Amlodipine, and Contrast media [iodinated contrast media]   Social History:  The patient  reports that she has never smoked. She has never used smokeless tobacco. She reports that she does not drink alcohol and does not use drugs.   Family History:   family history includes Cancer in her father and mother.    Review of Systems: Review of Systems  Constitutional: Negative.   HENT: Negative.    Respiratory:  Positive for shortness of breath.   Cardiovascular:  Positive for palpitations.  Gastrointestinal: Negative.   Musculoskeletal:  Positive for back pain.  Neurological: Negative.   Psychiatric/Behavioral: Negative.  Suicidal ideas: .   All other  systems reviewed and are negative.    PHYSICAL EXAM: VS:  BP 132/80 (BP Location: Left Arm, Patient Position: Sitting, Cuff Size: Normal)   Pulse (!) 113   Ht 5' (1.524 m)   Wt 192 lb 9 oz (87.3 kg)   SpO2 96%   BMI 37.61 kg/m  , BMI Body mass index is 37.61 kg/m. Constitutional:  oriented to person, place, and time. No distress.  HENT:  Head: Grossly normal Eyes:  no discharge. No scleral icterus.  Neck: No JVD, no carotid bruits  Cardiovascular: Irregularly irregular, rapid no murmurs appreciated Pulmonary/Chest: Clear to auscultation bilaterally, no wheezes or rails Abdominal: Soft.  no distension.  no tenderness.  Musculoskeletal: Normal range of motion Neurological:  normal muscle tone. Coordination normal. No atrophy Skin: Skin warm and dry Psychiatric: normal affect, pleasant    Recent Labs: 11/12/2021: ALT 9; BUN 40; Creatinine 2.04; Hemoglobin 12.2; Platelet Count 143; Potassium 4.8; Sodium 140    Lipid Panel Lab Results  Component Value Date   CHOL 124 07/20/2021   HDL 29 (L) 07/20/2021   LDLCALC 73 07/20/2021   TRIG 122 07/20/2021      Wt Readings from Last 3 Encounters:  04/19/22 192 lb 9 oz (87.3 kg)  04/16/22 191 lb 3 oz (86.7 kg)  03/23/22 196 lb 6.4 oz (89.1 kg)     ASSESSMENT AND PLAN:  Essential hypertension -  Blood pressure is well controlled on today's visit. No changes made to the medications.  Atrial fibrillation with RVR Has had atrial fibrillation past week or 2, symptomatic, unable to convert with higher dose metoprolol succinate 100 twice daily Continue eliquis 5 BID Recommend she start amiodarone 400 twice daily 7 days then down to 200 twice daily in effort to convert May need cardioversion if still in atrial fibrillation and follow-up  Type 2 diabetes mellitus with hyperglycemia, without long-term current use of insulin ([place]) Follows with endocrine Lifestyle modification recommended, low carbohydrate diet Has diabetic  nephropathy creatinine 2.0  morbid obesity We have encouraged continued exercise, careful diet management in an effort to lose weight.  HLD (hyperlipidemia) Tolerating simvastatin, zetia Cholesterol at goal  Anemia History of iron deficiency, followed by primary care Hemoglobin stable Chronic leukocytosis  Chronic renal failure Followed by nephrology CR high end of range, 2.0 Prior history of poorly controlled diabetes   Total encounter time more than 30 minutes  Greater than 50% was spent in counseling and coordination of care with the patient   No orders of the defined types were placed in this encounter.  Signed, Esmond Plants, M.D., Ph.D. 04/19/2022  [place], [place]

## 2022-04-19 NOTE — Telephone Encounter (Signed)
Scheduled same day [facility]

## 2022-04-19 NOTE — Patient Instructions (Addendum)
Medication Instructions:  Please start amiodarone 400 mg twice a day for 7 days Then down to 200 mg twice a day  Stay on metoprolol succinate 100 twice a day  Monitor pulse  rate  If you need a refill on your cardiac medications before your next appointment, please call your pharmacy.   Lab work: Today: T[facility]  [facility] Mall Entrance at [facility] 1st desk on the right to check in (REGISTRATION)  Lab hours: Monday- Friday (7:30 am- 5:30 pm)   Testing/Procedures: No new testing needed  Follow-Up: At [facility], you and your health needs are our priority.  As part of our continuing mission to provide you with exceptional heart care, we have created designated Provider Care Teams.  These Care Teams include your primary Cardiologist (physician) and Advanced Practice Providers (APPs -  Physician Assistants and Nurse Practitioners) who all work together to provide you with the care you need, when you need it.  You will need a follow up appointment in 3 weeks  Providers on your designated Care Team:   Murray Hodgkins, NP Christell Faith, PA-C Cadence Kathlen Mody, [place]  COVID-19 Vaccine Information can be found at: [email or web address] For questions related to vaccine distribution or appointments, please email [email or web address] or call [phone].

## 2022-04-20 ENCOUNTER — Ambulatory Visit (INDEPENDENT_AMBULATORY_CARE_PROVIDER_SITE_OTHER): Payer: Medicare Other | Admitting: *Deleted

## 2022-04-20 DIAGNOSIS — I10 Essential (primary) hypertension: Secondary | ICD-10-CM

## 2022-04-20 DIAGNOSIS — E118 Type 2 diabetes mellitus with unspecified complications: Secondary | ICD-10-CM

## 2022-04-20 NOTE — Chronic Care Management (AMB) (Signed)
Chronic Care Management   CCM RN Visit Note  04/20/2022 Name: Brenda [name] MRN: [id number] DOB: 1951/07/15  Subjective: Brenda [name] is a 71 y.o. year old female who is a primary care patient of [name], MD. The care management team was consulted for assistance with disease management and care coordination needs.    Engaged with patient by telephone for follow up visit in response to provider referral for case management and/or care coordination services.   Consent to Services:  The patient was given information about Chronic Care Management services, agreed to services, and gave verbal consent prior to initiation of services.  Please see initial visit note for detailed documentation.  Patient agreed to services and verbal consent obtained.   Assessment: Review of patient past medical history, allergies, medications, health status, including review of consultants reports, laboratory and other test data, was performed as part of comprehensive evaluation and provision of chronic care management services.   SDOH (Social Determinants of Health) assessments and interventions performed:  SDOH Interventions    Flowsheet Row Most Recent Value  SDOH Interventions   Food Insecurity Interventions Intervention Not Indicated  [continues to deny food insecurity]  Transportation Interventions Intervention Not Indicated  [continues to drive self]     CCM Care Plan  Allergies  Allergen Reactions   Gadolinium Derivatives Other (See Comments)   Nsaids Other (See Comments)    Due to kidney function   Amlodipine     Swelling     Contrast Media [Iodinated Contrast Media] Other (See Comments)    Patient states unable to take / use due to kidney function   Outpatient Encounter Medications as of 04/20/2022  Medication Sig   acetaminophen (TYLENOL) 500 MG tablet Take 500-1,000 mg by mouth every 6 (six) hours as needed for mild pain, moderate pain or fever.    ALPRAZolam (XANAX)  1 MG tablet TAKE 1/2 TO 1 TABLET(0.5 TO 1 MG) BY MOUTH TWICE DAILY AS NEEDED FOR ANXIETY   amiodarone (PACERONE) 200 MG tablet Take 400 mg (2 tablets) twice daily for 7 days, then take 200 mg (1 tablet) twice daily.   BD PEN NEEDLE NANO 2ND GEN 32G X 4 MM MISC USE DAILY AS DIRECTED   Blood Glucose Monitoring Suppl (ONETOUCH VERIO) w/Device KIT Use as advised   buPROPion (WELLBUTRIN XL) 300 MG 24 hr tablet Take 1 tablet (300 mg total) by mouth daily.   Cholecalciferol (VITAMIN D3) 2000 units TABS Take 2,000 mcg by mouth daily.   Cyanocobalamin (B-12) 1000 MCG SUBL Place 1,000 mcg under the tongue every evening.    doxazosin (CARDURA) 2 MG tablet Take 1 tablet (2 mg total) by mouth every evening. (Patient not taking: Reported on 04/16/2022)   ELIQUIS 5 MG TABS tablet TAKE 1 TABLET(5 MG) BY MOUTH TWICE DAILY   esomeprazole (NEXIUM) 20 MG capsule Take 20 mg by mouth daily as needed (acid reflux symptoms). M-W-F   ezetimibe (ZETIA) 10 MG tablet Take 1 tablet (10 mg total) by mouth daily.   glipiZIDE (GLUCOTROL XL) 2.5 MG 24 hr tablet Take 1 tablet (2.5 mg total) by mouth 2 (two) times daily at 8 am and 10 pm.   glucose blood (ONETOUCH VERIO) test strip USE AS DIRECTED TO TEST BLOOD SUGAR THREE TIMES DAILY   insulin glargine (LANTUS SOLOSTAR) 100 UNIT/ML Solostar Pen Inject 24 Units into the skin daily.   iron polysaccharides (NIFEREX) 150 MG capsule Take 150 mg by mouth at bedtime.    JARDIANCE  10 MG TABS tablet TAKE 1 TABLET(10 MG) BY MOUTH DAILY BEFORE BREAKFAST   Lancet Devices (LANCING DEVICE) MISC Use as advised - Freestyle   Lancets (ONETOUCH DELICA PLUS ATFTDD22G) MISC CHECK BLOOD SUGAR THREE TIMES DAILY AS DIRECTED   lisinopril (ZESTRIL) 20 MG tablet Take 1 tablet (20 mg total) by mouth in the morning.   metoprolol succinate (TOPROL-XL) 100 MG 24 hr tablet Take 1 tablet (100 mg total) by mouth daily. Take with or immediately following a meal.   NONFORMULARY OR COMPOUNDED ITEM Apply 1  application topically 2 (two) times daily as needed (sun damage on face). FLUOROURACIL 5% + CALCIPOTRIENE 0.005%   simvastatin (ZOCOR) 40 MG tablet TAKE 1 TABLET(40 MG) BY MOUTH AT BEDTIME   traMADol (ULTRAM) 50 MG tablet Take 50 mg by mouth 3 (three) times daily as needed. (Patient not taking: Reported on 04/19/2022)   triamcinolone cream (KENALOG) 0.1 % Apply 1 application topically 2 (two) times daily as needed (skin rash.).    No facility-administered encounter medications on file as of 04/20/2022.   Patient Active Problem List   Diagnosis Date Noted   Osteopenia 05/22/2020   Well woman exam without gynecological exam 05/02/2019   SCC (squamous cell carcinoma), hand, right 05/02/2019   CKD (chronic kidney disease) stage 3, GFR 30-59 ml/min ([place]) 07/05/2018   CLL (chronic lymphocytic leukemia) ([place]) 03/13/2018   Anxiety 03/13/2018   Bilateral edema of lower extremity 01/18/2018   History of endometrial cancer 11/18/2017   Pelvic lymphadenopathy 11/18/2017   Daytime sleepiness 08/22/2017   Coronary artery disease of native artery of native heart with stable angina pectoris ([place]) 04/03/2017   S/P shoulder replacement, right 08/19/2016   Diabetes mellitus type 2 with complications ([place]) 25/42/7062   Morbid obesity ([place]) 01/24/2015   Chronic radicular low back pain 04/01/2014   Tachycardia 01/31/2013   OA (osteoarthritis) 09/20/2011   Hyperlipidemia associated with type 2 diabetes mellitus ([place]) 11/06/2010   Depression with anxiety 11/06/2010   Essential hypertension 11/06/2010   GERD 11/06/2010   Conditions to be addressed/monitored:  Atrial Fibrillation, HTN, and DMII  Care Plan : RN Care Manager Plan of Care  Updates made by Knox Royalty, RN since 04/20/2022 12:00 AM     Problem: Chronic Disease Management Needs   Priority: High     Long-Range Goal: Ongoing adherence to established plan of care for long term chronic disease management   Start Date: 01/28/2022  Expected End  Date: 01/29/2023  Priority: High  Note:   Current Barriers:  Chronic Disease Management support and education needs related to HTN and DMII Lives alone- no local family Chronic back pain: currently followed by established orthopedic provider; with pain management referral pending  RNCM Clinical Goal(s):  Patient will demonstrate ongoing health management independence as evidenced by adherence to plan of care for DMII; HTN/ HLD        through collaboration with RN Care manager, provider, and care team.   Interventions: 1:1 collaboration with primary care provider regarding development and update of comprehensive plan of care as evidenced by provider attestation and co-signature Inter-disciplinary care team collaboration (see longitudinal plan of care) Evaluation of current treatment plan related to  self management and patient's adherence to plan as established by provider 01/28/22: CCM RN CM initial assessment updated/ completed Review of patient status, including review of consultants reports, relevant laboratory and other test results, and medications completed SDOH updated: no new/ unmet concerns identified Pain assessment updated: continues to report ongoing chronic  back pain when standing or walking; she is currently sitting down during our telephone visit and denies pain Confirms she is now established with pain management clinic provider [facility]; had recent visit with Dr. Nelva Bush; to start outpatient PT "soon;" reports may consider non-steroidal injections in the future; currently taking Tramadol as/ if needed Falls assessment updated: continues to deny new/ recent falls since August 2022; not currently using assistive devices;  positive reinforcement provided with encouragement to continue efforts at fall prevention; previously provided education around fall risks/ prevention reinforced Medications discussed: reports continues to independently self-manage and denies current  concerns/ issues/ questions around medications; endorses adherence to taking all medications as prescribed Has not yet received follow up from outpatient pharmacy that Amiodarone prescribed during yesterday's cardiology visit is ready for pick up- states outpatient pharmacy has assured her that it will be ready for pick up today and she will obtain accordingly: able to verbalize accurate understanding of dosing once she obtains medication from outpatient pharmacy Confirms she has temporarily stopped taking cardura to prevent decreased blood pressure while in A-Fib Reviewed recent provider appointments: 02/16/22- renal provider; "Dr. Hollie Salk" 03/23/22- endocrinology provider: confirms she is now taking glipizide 2.5 mg po before lunch/ dinner 04/16/22- PCP 04/19/22- cardiology provider- recent ongoing persistent episodes AF Reviewed upcoming scheduled provider appointments: 05/10/22- cardiology; 05/12/22- oncology; 07/27/22- endocrinology; 10/15/22- PCP; patient confirms is aware of all and has plans to attend as scheduled Discussed plans with patient for ongoing care management follow up and provided patient with direct contact information for care management team       Diabetes:  (Status: 04/2022: Goal on Track (progressing): YES.) Long Term Goal   Lab Results  Component Value Date   HGBA1C 6.9 (A) 03/23/2022    Reviewed prescribed diet with patient carbohydrate modified, low sugar, heart healthy, low salt/ low cholesterol; Confirmed patient continues to monitor and write down on paper blood sugars at home three times per day Reviewed recent fasting blood sugars with patient: she reports "no changes" and again reports general ranges between 100-130; denies recent episodes of hypoglycemia Reinforced previously provided education around signs/ symptoms low blood sugar along with corresponding action plan: she is a retired Therapist, 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 Assessed patient's understanding of meaning/  significance of A1-C values: good baseline understanding of same- Reviewed individual historical A1-C trends and provided education around correlation of A1-C value to blood sugar levels at home over 3 months: noted along with patient that her recently updated A1-C is right on track with previous values: positive reinforcement provided with encouragement to continue efforts Discussed medications for DMII: she continues to verbalize a very good understanding of medications and denies questions/ concerns; as above confirmed that she is taking modified dosing of glipizide as per instructions from endocrinology provider in effort to minimize possibility of hypoglycemic episodes  Hypertension/ A-Fib: (Status: 04/20/22: Goal on Track (progressing): YES.) Long Term Goal  Last practice recorded BP readings:  BP Readings from Last 3 Encounters:  04/19/22 132/80  04/16/22 132/74  03/23/22 130/68  Most recent eGFR/CrCl: No results found for: EGFR  No components found for: CRCL  Evaluation of current treatment plan related to hypertension self management and patient's adherence to plan as established by provider;   Discussed complications of poorly controlled blood pressure such as heart disease, stroke, circulatory complications, vision complications, kidney impairment, sexual dysfunction;  Confirmed patient monitors/ writes down on paper blood pressures at home: she reports all blood pressures "normal;" despite  her recent episodes of persistent A-Fib with RVR Provided education and confirmed patient able to verbalize signs/ symptoms A-Fib, along with corresponding action plan for same-- she is a retired Therapist, 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 about signs/ symptoms/ risks of being in A-Fib, as well as action plan for same; able to verbalize reasons she would go to ED; thus far although she is aware of episodic A-Fib, she reports her blood pressures have been stable and she denies specific problematic symptoms: states  "mainly, I just feel tired and weak;" monitoring heart rate at home several times per day: reports heart rate currently at "111" b/min  Patient Goals/Self-Care Activities: As evidenced by review of EHR, collaboration with care team, and patient reporting during CCM RN CM outreach,  Patient Kyla will: Take medications as prescribed Attend all scheduled provider appointments Call pharmacy for medicatiron refills Call provider office for new concens or questions Continue to check fasting (first thing in the morning, before eating) and later in day blood sugars at home as you have been Continue to check blood pressures at home as you have been Continue to maintain close communication with your cardiologist since you are currently experiencing episodes of A-Fib Continue to follow heart healthy, low salt, low cholesterol, carbohydrate-modified, low sugar diet Keep up the  great work at preventing recent falls- take your time, don't rush; don't over-do with activity, pace yourself     Plan: Telephone follow up appointment with care management team member scheduled for:  Tuesday, June 29, 2022 at 9:00 am The patient has been provided with contact information for the care management team and has been advised to call with any health related questions or concerns   Oneta Rack, RN, BSN, [place] [phone]: direct office

## 2022-04-20 NOTE — Patient Instructions (Signed)
Visit Information  Brenda [name], thank you for taking time to talk with me today. Please don't hesitate to contact me if I can be of assistance to you before our next scheduled telephone appointment  Below are the goals we discussed today:  Patient Self-Care Activities: Patient Brenda [name] will: Take medications as prescribed Attend all scheduled provider appointments Call pharmacy for medicatiron refills Call provider office for new concens or questions Continue to check fasting (first thing in the morning, before eating) and later in day blood sugars at home as you have been Continue to check blood pressures at home as you have been Continue to maintain close communication with your cardiologist since you are currently experiencing episodes of A-Fib Continue to follow heart healthy, low salt, low cholesterol, carbohydrate-modified, low sugar diet Keep up the  great work at preventing recent falls- take your time, don't rush; don't over-do with activity, pace yourself  Our next scheduled telephone follow up visit/ appointment is scheduled on: Tuesday, June 29, 2022 at 9:00 am- This is a PHONE [place] appointment  If you need to cancel or re-schedule our visit, please call [phone] and our care guide team will be happy to assist you.   I look forward to hearing about your progress.   Brenda Rack, RN, BSN, [place] [phone]: direct office  If you are experiencing a Mental Health or Start or need someone to talk to, please  call the Suicide and Crisis Lifeline: 988 call the [place] National Suicide Prevention Lifeline: 252-549-9080 or TTY: (442) 259-7824 TTY (443)179-1700) to talk to a trained counselor call 1-800-273-TALK (toll free, 24 hour hotline) go to [facility] [address] [place] [phone]) call 911   Patient verbalizes understanding of instructions  and care plan provided today and agrees to view in [place]. Active MyChart status and patient understanding of how to access instructions and care plan via MyChart confirmed with patient  Preventing Atrial Fibrillation-Related Stroke Atrial fibrillation (AFib) is a common type of irregular or rapid heartbeat (arrhythmia) that increases the risk for a stroke. In AFib, the top portions of the heart (atria) beat out of sync with the lower portions of the heart. When the muscles of the atria tighten in an uncoordinated way (fibrillating), blood can pool in the heart and form clots. A stroke can be caused by a blood clot that travels to the brain. This type of stroke is preventable. Understanding AFib and knowing how to properly manage it can prevent a stroke from happening. How can this condition affect me? Having AFib can increase your risk for a stroke. A stroke is a medical emergency. It can lead to brain damage and can sometimes be life-threatening. A stroke can be a life-changing event. What can increase my risk? Having AFib can increase your risk of stroke. Other risk factors include: Having heart failure. Having high blood pressure. Being older than age 35. Having diabetes. Having a history of vascular disease, such as heart attack or stroke. Being female. Having a history of transient ischemic attacks (TIAs). This is sometimes called a ministroke. Having a family history of stroke. Risk factors that you can change include: Smoking. High cholesterol. Being inactive (sedentary lifestyle). Eating a diet that is high in fat, cholesterol, and salt. What actions can I take to prevent this? Maintain a healthy weight. Get regular exercise. This can include at least 40 minutes of moderate exercise on most days, such  as walking at a fast pace, or vigorous exercise, such as jogging. Get evaluated for obstructive sleep apnea. Talk to your health care provider about getting a sleep evaluation if you  snore a lot or have excessive sleepiness. Manage any other medical conditions you have, such as hypertension or diabetes. Medicines Take over-the-counter and prescription medicines only as told by your health care provider. If your health care provider prescribed an anticoagulant, take it exactly as told. Taking too much blood-thinning medicine can cause bleeding. Taking too little may not give you the protection that you need against stroke and other problems. Eating and drinking Eat healthy foods, including at least 5 servings of fruits and vegetables a day. Do not drink alcohol. Do not drink beverages that have caffeine, such as coffee, soda, and tea. Follow instructions about your diet as told by your health care provider. Questions to ask your health care provider Contact a health care provider if: You notice a change in the rate, rhythm, or strength of your heartbeat. You feel dizzy. You are taking an anticoagulant and you have more bruises than usual. You get tired more easily when you exercise or do similar activities. Get help right away if:  You have chest pain. You have trouble breathing. You have pain in your abdomen. You experience unusual sweating or weakness. You take anticoagulants and you: Have severe headaches or confusion. Have blood in your vomit, bowel movement, or urine. Have bleeding that will not stop. Fall or injure your head. You have any symptoms of a stroke. "BE FAST" is an easy way to remember the main warning signs of a stroke: B - Balance. Signs are dizziness, trouble walking, or loss of balance. E - Eyes. Signs are trouble seeing or a sudden change in vision. F - Face. Signs are sudden weakness or numbness of the face, or the face or eyelid drooping on one side. A - Arms. Signs are weakness or numbness in an arm. This happens suddenly and usually on one side of the body. S - Speech. Signs are sudden trouble speaking, slurred speech, or trouble  understanding what people say. T - Time. Time to call emergency services. Write down what time symptoms started. You have other signs of a stroke, such as: A sudden, severe headache with no known cause. Nausea or vomiting. Seizure. These symptoms may represent a serious problem that is an emergency. Do not wait to see if the symptoms will go away. Get medical help right away. Call your local emergency services (911 in the [place] Do not drive yourself to the hospital. Summary Having atrial fibrillation (AFib) can increase the risk for a stroke. Talk with your health care provider about what symptoms to watch for. AFib-related stroke is preventable. Proper management of AFib can prevent you from having a stroke. Talk with your health care provider about whether anticoagulant medicine is right for you. Learn the warning signs of a stroke and remember "BE FAST." This information is not intended to replace advice given to you by your health care provider. Make sure you discuss any questions you have with your health care provider. Document Revised: 06/26/2020 Document Reviewed: 06/26/2020 Elsevier Patient Education  [place].

## 2022-04-21 ENCOUNTER — Telehealth: Payer: Self-pay | Admitting: Cardiovascular Disease

## 2022-04-21 MED ORDER — METOPROLOL SUCCINATE ER 100 MG PO TB24: 50.0000 mg | ORAL_TABLET | Freq: Two times a day (BID) | ORAL | 3 refills | Status: DC

## 2022-04-21 NOTE — Telephone Encounter (Signed)
Pt c/o medication issue:  1. Name of Medication: amiodarone (PACERONE) 200 MG tablet  2. How are you currently taking this medication (dosage and times per day)? 400 mg twice a day, started it yesterday  3. Are you having a reaction (difficulty breathing--STAT)? no  4. What is your medication issue? Patient states she just started the medication yesterday and her HR has dropped this morning. She says her HR is in the 50's and her BP has been ranging around 110/62. She states her BP is normal for her. She says she is not having any other symptoms.

## 2022-04-21 NOTE — Telephone Encounter (Signed)
Spoke with Dr. Rockey Situ. Dr. Rockey Situ recommends that patient hold metoprolol this morning and only take 200 mg amiodarone BID. Patient may take 50 mg metoprolol should her heart rate get tachycardic later today.   Called and spoke with patient. Patient reports that when she takes her pulse manually it feels "kind of irregular". Advised patient that she may have converted to SR with PVCs or PACs, which may be why it feels irregular on palpation.   Went over MDs recommendations. Pt verbalized understanding.   Advised patient to call or send MyChart message should she start to feel dizzy or SOB with HR in 50s and 60s, or should her BP start to drop, or if she starts to have trouble keeping her HR below 100 again.

## 2022-04-28 ENCOUNTER — Encounter: Payer: Self-pay | Admitting: Cardiovascular Disease

## 2022-04-30 DIAGNOSIS — E1159 Type 2 diabetes mellitus with other circulatory complications: Secondary | ICD-10-CM

## 2022-04-30 DIAGNOSIS — I4891 Unspecified atrial fibrillation: Secondary | ICD-10-CM

## 2022-04-30 DIAGNOSIS — Z794 Long term (current) use of insulin: Secondary | ICD-10-CM

## 2022-04-30 DIAGNOSIS — M545 Low back pain, unspecified: Secondary | ICD-10-CM | POA: Diagnosis not present

## 2022-04-30 DIAGNOSIS — I10 Essential (primary) hypertension: Secondary | ICD-10-CM

## 2022-05-05 ENCOUNTER — Telehealth: Payer: Self-pay | Admitting: Hematology

## 2022-05-05 DIAGNOSIS — M545 Low back pain, unspecified: Secondary | ICD-10-CM | POA: Diagnosis not present

## 2022-05-05 NOTE — Telephone Encounter (Signed)
Left message with rescheduled upcoming appointment due to provider's PAL.

## 2022-05-07 DIAGNOSIS — M545 Low back pain, unspecified: Secondary | ICD-10-CM | POA: Diagnosis not present

## 2022-05-07 NOTE — Progress Notes (Deleted)
Cardiology Office Note    Date:  05/07/2022   ID:  Brenda [name], Brenda [name] [birth year], MRN [id number]  PCP:  [name], MD  Cardiologist:  Ida Rogue, MD  Electrophysiologist:  None   Chief Complaint: Follow up  History of Present Illness:   Brenda [name] is a 71 y.o. female with history of coronary artery calcification by CT imaging, persistent Afib, DM2, HLD, CLL, CKD stage IIIb-IV, morbid obesity, and anemia who presents for follow up of Afib.   Calcium score in 12/2015 of 1031, which was the 97th percentile, with diffuse calcification in the entire LAD and RCA. Noncardiac overread also noted multiple pulmonary nodules scattered throughout the lungs bilaterally as noted below, along with a mildly enlarged right axillary lymph node with follow up imaging in 11/2017 showing stable pulmonary nodules as well as borderline to mildly enlarged axillary, subpectoral, and mediastinal lymph nodes. Subsequent PET scan noted to be consistent with CLL, confirmed by biopsy.  She was diagnosed with new onset Afib at office visit in 03/2021, and started on Eliquis, and was found to be in sinus rhythm in follow up in 04/2021. Echo from 05/2021 demonstrated an EF of 60-65%, no RWMA, normal LV diastolic function parameters, normal RV systolic function and ventricular cavity size, and mild mitral regurgitation.  She was most recently seen in the office on 04/19/2022 and noted to be back in Afib with RVR, despite escalation of metoprolol via My Chart prior to the OV. She was started on amiodarone and continued on metoprolol and Eliquis. With this, she noted an improvement in her heart rates to the 50s to 60s bpm. Further messages indicated stable heart rates, with BPs in the 140s to 160s mmHg with recommendation to resume Cardura in the evening.   ***   Labs independently reviewed: 04/2022 - TSH normal 03/2022 - A1c 6.9 11/2021 - WBC 15.9, Hgb 12.2, PLT 143, potassium 4.8, BUN 40, serum  creatinine 2.04, albumin 4.2, AST/ALT not elevated 07/2021 - TC 124, TG 122, HDL 29, LDL 73  Past Medical History:  Diagnosis Date   Anxiety    Arthritis    Atrial fibrillation (HCC)    CLL (chronic lymphocytic leukemia) ([place])    Dx. 2019 Dr. 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 Particia Nearing first visit 11-2019    Past Surgical History:  Procedure Laterality Date   ABDOMINAL HYSTERECTOMY  1998   COLONOSCOPY     EYE SURGERY Bilateral    cataracts   EYE SURGERY Left  retina tear   FEMUR IM NAIL  10/25/2012   Procedure: INTRAMEDULLARY (IM) NAIL FEMORAL;  Surgeon: Mauri Pole, MD;  Location: [facility];  Service: Orthopedics;  Laterality: Left;   HIP SURGERY     Fracture L IM nail and 3 rods   left knee arthroscopy  12/2010   LYMPH NODE BIOPSY     axillary left 12-2018   REFRACTIVE SURGERY     skin cancer removed right and and lower forearm     squamoun in situ   TOTAL KNEE ARTHROPLASTY  12/20/2011   Procedure: TOTAL KNEE ARTHROPLASTY;  Surgeon: Gearlean Alf, MD;  Location: [facility];  Service: Orthopedics;  Laterality: Left;  Failed attempt at spinal    TOTAL SHOULDER ARTHROPLASTY Right 08/19/2016   Procedure: RIGHT TOTAL SHOULDER  ARTHROPLASTY;  Surgeon: Justice Britain, MD;  Location: [place];  Service: Orthopedics;  Laterality: Right;   TOTAL SHOULDER ARTHROPLASTY Left 01/10/2020   Procedure: TOTAL SHOULDER ARTHROPLASTY;  Surgeon: Justice Britain, MD;  Location: [facility];  Service: Orthopedics;  Laterality: Left;  130mn    Current Medications: No outpatient medications have been marked as taking for the 05/10/22 encounter (Appointment) with DRise Mu PA-C.    Allergies:   Gadolinium derivatives, Nsaids, Amlodipine, and Contrast media [iodinated contrast media]   Social History   Socioeconomic History   Marital status: Single    Spouse name: Not on file   Number of children: Not on file   Years of education: Not on file   Highest education level: Not on file  Occupational History   Occupation: RTherapist, 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    Employer: [employer]  Tobacco Use   Smoking status: Never   Smokeless tobacco: Never  Vaping Use   Vaping Use: Never used  Substance and Sexual Activity   Alcohol use: No   Drug use: No   Sexual activity: Not Currently  Other Topics Concern   Not on file  Social History Narrative   RN at [employer]            Social Determinants of Health   Financial Resource Strain: Low Risk  (03/10/2022)   Overall Financial Resource Strain (CARDIA)    Difficulty of Paying Living Expenses: Not hard at all  Food Insecurity: No Food Insecurity (04/20/2022)   Hunger Vital Sign    Worried About Running Out of Food in the Last Year: Never true    [place] the Last [place]: Never true  Transportation Needs: No Transportation Needs (04/20/2022)   PRAPARE - THydrologist(Medical): No    Lack of Transportation (Non-Medical): No  Physical Activity: Inactive (03/10/2022)   Exercise Vital Sign    Days of Exercise per Week: 0 days    Minutes of Exercise per Session: 0 min  Stress: No Stress Concern Present (03/10/2022)   [place]   Feeling of Stress : Not at all  Social Connections: Moderately Isolated (03/10/2022)   Social Connection and Isolation Panel [NHANES]    Frequency of Communication with Friends and Family: More than three times a week    Frequency of Social Gatherings with Friends and Family: More than three times a week    Attends Religious Services: More than 4 times per year    Active Member of [employer] Organizations: No    Attends CArchivistMeetings: Never  Marital Status: Never married     Family History:  The patient's family history includes Cancer in her father and mother. There is no history of Breast cancer.  ROS:   ROS   EKGs/Labs/Other Studies Reviewed:    Studies reviewed were summarized above. The additional studies were reviewed today:  2D echo 05/22/2021: 1. Left ventricular ejection fraction, by estimation, is 60 to 65%. The  left ventricle has normal function. The left ventricle has no regional  wall motion abnormalities. Left ventricular diastolic parameters were  normal.   2. Right ventricular systolic function is normal. The right ventricular  size is normal.   3. The mitral valve is normal in structure. Mild mitral valve  regurgitation. __________  Calcium score 01/13/2016:  Noncardiac over read: 1. There multiple pulmonary nodules scattered throughout the lungs bilaterally, the largest of which measures up to 9 mm in the left lower lobe. Non-contrast chest CT at 3-6 months is recommended. If the nodules are stable at time of repeat CT, then future CT at 18-24 months (from today's scan) is considered optional for low-risk patients, but is recommended for high-risk patients. This recommendation follows the consensus statement: Guidelines for Management of Incidental Pulmonary Nodules Detected on CT Images:From the Fleischner Society 2017; published online before print (10.1148/radiol.3154008676). 2. Mildly enlarged  right axillary lymph node measuring 1 cm in short axis. This is nonspecific, but correlation with mammography is suggested in the near future. If mammography is abnormal, further evaluation with contrast enhanced CT of the chest, abdomen and pelvis could be considered to evaluate for metastatic disease. These results will be called to the ordering clinician or representative by the Radiologist Assistant, and communication documented in the PACS or zVision Dashboard.  Cardiac overread: IMPRESSION: Coronary calcium score of 1031. This was 97th percentile for age and sex matched control. Consider f/u perfusion study to further assess CAD See radiology report regarding right axillary lymph node and pulmonary nodules    EKG:  EKG is ordered today.  The EKG ordered today demonstrates ***  Recent Labs: 11/12/2021: ALT 9; BUN 40; Creatinine 2.04; Hemoglobin 12.2; Platelet Count 143; Potassium 4.8; Sodium 140 04/19/2022: TSH 2.453  Recent Lipid Panel    Component Value Date/Time   CHOL 124 07/20/2021 0915   TRIG 122 07/20/2021 0915   HDL 29 (L) 07/20/2021 0915   CHOLHDL 4.3 07/20/2021 0915   CHOLHDL 5 05/15/2020 0827   VLDL 41.4 (H) 05/15/2020 0827   LDLCALC 73 07/20/2021 0915   LDLDIRECT 55.0 05/15/2020 0827    PHYSICAL EXAM:    VS:  There were no vitals taken for this visit.  BMI: There is no height or weight on file to calculate BMI.  Physical Exam  Wt Readings from Last 3 Encounters:  04/19/22 192 lb 9 oz (87.3 kg)  04/16/22 191 lb 3 oz (86.7 kg)  03/23/22 196 lb 6.4 oz (89.1 kg)     ASSESSMENT & PLAN:   Persistent Afib:  Coronary artery calcification/HLD:  HTN: Blood pressure ***  CKD stage IIIb-IV:  Anemia:   {Are you ordering a CV Procedure (e.g. stress test, cath, DCCV, TEE, etc)?   Press F2        :[id number]}     Disposition: F/u with Dr. Rockey Situ or an APP in ***.   Medication Adjustments/Labs and Tests Ordered: Current medicines are reviewed at  length with the patient today.  Concerns regarding medicines are outlined above. Medication changes, Labs and Tests ordered today are summarized above and  listed in the Patient Instructions accessible in Encounters.   Signed, Christell Faith, PA-C 05/07/2022 9:38 AM     [facility] [address] [place] [address] [place],  [address] [phone]

## 2022-05-10 ENCOUNTER — Ambulatory Visit: Payer: Medicare Other | Admitting: Physician Assistant

## 2022-05-11 DIAGNOSIS — M545 Low back pain, unspecified: Secondary | ICD-10-CM | POA: Diagnosis not present

## 2022-05-12 ENCOUNTER — Other Ambulatory Visit: Payer: Medicare Other

## 2022-05-12 ENCOUNTER — Ambulatory Visit: Payer: Medicare Other | Admitting: Hematology

## 2022-05-13 DIAGNOSIS — M545 Low back pain, unspecified: Secondary | ICD-10-CM | POA: Diagnosis not present

## 2022-05-13 NOTE — Progress Notes (Signed)
Cardiology Office Note    Date:  05/14/2022   ID:  Ival, Basquez 18-Feb-1951, MRN [id number]  PCP:  [name], MD  Cardiologist:  Ida Rogue, MD  Electrophysiologist:  None   Chief Complaint: Follow-up  History of Present Illness:   Brenda [name] is a 71 y.o. female with history of coronary artery calcification by CT imaging, persistent Afib, DM2, HLD, CLL, CKD stage IIIb-IV, morbid obesity, and anemia who presents for follow up of Afib.   Calcium score in 12/2015 of 1031, which was the 97th percentile, with diffuse calcification in the entire LAD and RCA. Noncardiac overread also noted multiple pulmonary nodules scattered throughout the lungs bilaterally as noted below, along with a mildly enlarged right axillary lymph node with follow up imaging in 11/2017 showing stable pulmonary nodules as well as borderline to mildly enlarged axillary, subpectoral, and mediastinal lymph nodes. Subsequent PET scan noted to be consistent with CLL, confirmed by biopsy.  She was diagnosed with new onset Afib at office visit in 03/2021, and started on Eliquis, and was found to be in sinus rhythm in follow up in 04/2021. Echo from 05/2021 demonstrated an EF of 60-65%, no RWMA, normal LV diastolic function parameters, normal RV systolic function and ventricular cavity size, and mild mitral regurgitation.  She was most recently seen in the office on 04/19/2022 and noted to be back in Afib with RVR, despite escalation of metoprolol via My Chart prior to the OV. She was started on amiodarone and continued on metoprolol and Eliquis. With this, she noted an improvement in her heart rates to the 50s to 60s bpm. Further messages indicated stable heart rates, with BPs in the 140s to 160s mmHg with recommendation to resume Cardura in the evening.   She comes in doing reasonably well from a cardiac perspective, and is without symptoms of angina or decompensation.  Following her initial dose of  amiodarone 400 mg, the next morning she noted her heart rates were down into the upper 40s bpm.  Since then, her heart rates have largely ranged from the 50s to 60s bpm.  At times, she is having to hold her metoprolol 25 mg twice daily due to heart rates less than 60 bpm.  She has noted an uptrend in her blood pressures with readings ranging from the 1 teens to 267T systolic with most readings largely in the 245Y to 099I systolic.  Currently, she is taking lisinopril 20 mg in the morning, Cardura 1 mg in the evening, and metoprolol succinate 25 mg twice daily with hold parameters for heart rate less than 60 bpm.  No falls, hematochezia, or melena.  No presyncope or syncope.  No significant lower extremity swelling.  At baseline, she sleeps in a recliner secondary to back pain.  She is working with PT for her back pain and has noted an improvement in this.   Labs independently reviewed: 04/2022 - TSH normal 03/2022 - A1c 6.9 11/2021 - WBC 15.9, Hgb 12.2, PLT 143, potassium 4.8, BUN 40, serum creatinine 2.04, albumin 4.2, AST/ALT not elevated 07/2021 - TC 124, TG 122, HDL 29, LDL 73    Past Medical History:  Diagnosis Date   Anxiety    Arthritis    Atrial fibrillation (HCC)    CLL (chronic lymphocytic leukemia) (HCC)    Dx. 2019 Dr. 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 Particia Nearing first visit 11-2019    Past Surgical History:  Procedure Laterality Date   ABDOMINAL HYSTERECTOMY  1998   COLONOSCOPY     EYE SURGERY Bilateral    cataracts   EYE SURGERY Left    retina tear   FEMUR IM NAIL  10/25/2012   Procedure: INTRAMEDULLARY (IM) NAIL FEMORAL;  Surgeon: Mauri Pole, MD;  Location: [facility];  Service: Orthopedics;  Laterality: Left;   HIP SURGERY     Fracture L IM nail and 3 rods   left knee arthroscopy  12/2010   LYMPH NODE BIOPSY     axillary left 12-2018   REFRACTIVE SURGERY     skin cancer removed right and and lower forearm     squamoun in situ   TOTAL KNEE ARTHROPLASTY  12/20/2011   Procedure: TOTAL KNEE ARTHROPLASTY;  Surgeon: Gearlean Alf, MD;  Location: [facility];  Service: Orthopedics;  Laterality: Left;  Failed attempt at spinal    TOTAL SHOULDER ARTHROPLASTY Right 08/19/2016   Procedure: RIGHT TOTAL SHOULDER ARTHROPLASTY;  Surgeon: Justice Britain, MD;  Location: [facility];  Service: Orthopedics;  Laterality: Right;   TOTAL SHOULDER ARTHROPLASTY Left 01/10/2020   Procedure: TOTAL SHOULDER ARTHROPLASTY;  Surgeon: Justice Britain, MD;  Location: [facility];  Service: Orthopedics;  Laterality: Left;  125mn    Current Medications: Current Meds  Medication Sig   acetaminophen (TYLENOL) 500 MG tablet Take 500-1,000 mg by mouth every 6 (six) hours as needed for mild pain, moderate pain or fever.    ALPRAZolam (XANAX) 1 MG tablet TAKE 1/2 TO 1 TABLET(0.5 TO 1 MG) BY MOUTH TWICE DAILY AS NEEDED FOR ANXIETY   amiodarone (PACERONE) 200 MG tablet Take 1 tablet (200 mg total) by mouth daily.   BD PEN NEEDLE NANO 2ND GEN 32G X 4 MM MISC USE DAILY AS DIRECTED   Blood Glucose Monitoring Suppl (ONETOUCH VERIO) w/Device KIT Use as advised   buPROPion (WELLBUTRIN XL) 300 MG 24 hr tablet Take 1 tablet (300 mg total) by mouth daily.   carvedilol  (COREG) 12.5 MG tablet Take 1 tablet (12.5 mg total) by mouth 2 (two) times daily with a meal.   Cholecalciferol (VITAMIN D3) 2000 units TABS Take 2,000 mcg by mouth daily.   Cyanocobalamin (B-12) 1000 MCG SUBL Place 1,000 mcg under the tongue every evening.    doxazosin (CARDURA) 2 MG tablet Take 1 mg by mouth daily.   ELIQUIS 5 MG TABS tablet TAKE 1 TABLET(5 MG) BY MOUTH TWICE DAILY   esomeprazole (NEXIUM) 20 MG capsule Take 20 mg by mouth daily as needed (acid reflux symptoms). M-W-F  ezetimibe (ZETIA) 10 MG tablet Take 1 tablet (10 mg total) by mouth daily.   glipiZIDE (GLUCOTROL XL) 2.5 MG 24 hr tablet Take 1 tablet (2.5 mg total) by mouth 2 (two) times daily at 8 am and 10 pm.   glucose blood (ONETOUCH VERIO) test strip USE AS DIRECTED TO TEST BLOOD SUGAR THREE TIMES DAILY   insulin glargine (LANTUS SOLOSTAR) 100 UNIT/ML Solostar Pen Inject 24 Units into the skin daily.   iron polysaccharides (NIFEREX) 150 MG capsule Take 150 mg by mouth at bedtime.    JARDIANCE 10 MG TABS tablet TAKE 1 TABLET(10 MG) BY MOUTH DAILY BEFORE BREAKFAST   Lancet Devices (LANCING DEVICE) MISC Use as advised - Freestyle   Lancets (ONETOUCH DELICA PLUS [id number]) MISC CHECK BLOOD SUGAR THREE TIMES DAILY AS DIRECTED   lisinopril (ZESTRIL) 20 MG tablet Take 1 tablet (20 mg total) by mouth in the morning.   metoprolol succinate (TOPROL-XL) 25 MG 24 hr tablet Take 25 mg by mouth in the morning and at bedtime. HOLD if HR <60   NONFORMULARY OR COMPOUNDED ITEM Apply 1 application topically 2 (two) times daily as needed (sun damage on face). FLUOROURACIL 5% + CALCIPOTRIENE 0.005%   simvastatin (ZOCOR) 40 MG tablet TAKE 1 TABLET(40 MG) BY MOUTH AT BEDTIME   triamcinolone cream (KENALOG) 0.1 % Apply 1 application topically 2 (two) times daily as needed (skin rash.).    [DISCONTINUED] amiodarone (PACERONE) 200 MG tablet Take 400 mg (2 tablets) twice daily for 7 days, then take 200 mg (1 tablet) twice daily.   [DISCONTINUED]  amiodarone (PACERONE) 200 MG tablet Take 200 mg by mouth 2 (two) times daily.    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    Employer: [employer]  Tobacco Use   Smoking status: Never   Smokeless tobacco: Never  Vaping Use   Vaping Use: Never used  Substance and Sexual Activity   Alcohol use: No   Drug use: No   Sexual activity: Not Currently  Other Topics Concern   Not on file  Social History Narrative   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            Social Determinants of Health   Financial Resource Strain: Low Risk  (03/10/2022)   Overall Financial Resource Strain (CARDIA)    Difficulty of Paying Living Expenses: Not hard at all  Food Insecurity: No Food Insecurity (04/20/2022)   Hunger Vital Sign    Worried About Running Out of Food in the Last Year: Never true    [place] in the Last Year: Never true  Transportation Needs: No Transportation Needs (04/20/2022)   PRAPARE - Hydrologist (Medical): No    Lack of Transportation (Non-Medical): No  Physical Activity: Inactive (03/10/2022)   Exercise Vital Sign    Days of Exercise per Week: 0 days    Minutes of Exercise per Session: 0 min  Stress: No Stress Concern Present (03/10/2022)   [place]    Feeling of Stress : Not at all  Social Connections: Moderately  Isolated (03/10/2022)   Social Connection and Isolation Panel [NHANES]    Frequency of Communication with Friends and Family: More than three times a week    Frequency of Social Gatherings with Friends and Family: More than three times a week    Attends  Religious Services: More than 4 times per year    Active Member of [employer] or Organizations: No    Attends Archivist Meetings: Never    Marital Status: Never married     Family History:  The patient's family history includes Cancer in her father and mother. There is no history of Breast cancer.  ROS:   12-point review of systems is negative unless otherwise noted in the HPI.   EKGs/Labs/Other Studies Reviewed:    Studies reviewed were summarized above. The additional studies were reviewed today:  2D echo 05/22/2021: 1. Left ventricular ejection fraction, by estimation, is 60 to 65%. The  left ventricle has normal function. The left ventricle has no regional  wall motion abnormalities. Left ventricular diastolic parameters were  normal.   2. Right ventricular systolic function is normal. The right ventricular  size is normal.   3. The mitral valve is normal in structure. Mild mitral valve  regurgitation. __________  Calcium score 01/13/2016:  Noncardiac over read: 1. There multiple pulmonary nodules scattered throughout the lungs bilaterally, the largest of which measures up to 9 mm in the left lower lobe. Non-contrast chest CT at 3-6 months is recommended. If the nodules are stable at time of repeat CT, then future CT at 18-24 months (from today's scan) is considered optional for low-risk patients, but is recommended for high-risk patients. This recommendation follows the consensus statement: Guidelines for Management of Incidental Pulmonary Nodules Detected on CT Images:From the Fleischner Society 2017; published online before print (10.1148/radiol.[id number]). 2. Mildly enlarged right axillary lymph node measuring 1 cm in short axis. This is nonspecific, but correlation with mammography is suggested in the near future. If mammography is abnormal, further evaluation with contrast enhanced CT of the chest, abdomen and pelvis could be considered to evaluate for  metastatic disease. These results will be called to the ordering clinician or representative by the Radiologist Assistant, and communication documented in the PACS or zVision Dashboard.  Cardiac overread: IMPRESSION: Coronary calcium score of 1031. This was 97th percentile for age and sex matched control. Consider f/u perfusion study to further assess CAD See radiology report regarding right axillary lymph node and pulmonary nodules   EKG:  EKG is ordered today.  The EKG ordered today demonstrates NSR, 66 bpm, baseline artifact, poor R wave progression along the precordial leads, nonspecific ST-T changes  Recent Labs: 11/12/2021: ALT 9; BUN 40; Creatinine 2.04; Hemoglobin 12.2; Platelet Count 143; Potassium 4.8; Sodium 140 04/19/2022: TSH 2.453  Recent Lipid Panel    Component Value Date/Time   CHOL 124 07/20/2021 0915   TRIG 122 07/20/2021 0915   HDL 29 (L) 07/20/2021 0915   CHOLHDL 4.3 07/20/2021 0915   CHOLHDL 5 05/15/2020 0827   VLDL 41.4 (H) 05/15/2020 0827   LDLCALC 73 07/20/2021 0915   LDLDIRECT 55.0 05/15/2020 0827    PHYSICAL EXAM:    VS:  BP (!) 160/80 (BP Location: Left Arm, Patient Position: Sitting, Cuff Size: Normal)   Pulse 70   Ht 5' (1.524 m)   Wt 192 lb (87.1 kg)   SpO2 96%   BMI 37.50 kg/m   BMI: Body mass index is 37.5 kg/m.  Physical Exam Vitals reviewed.  Constitutional:  Appearance: She is well-developed.  HENT:     Head: Normocephalic and atraumatic.  Eyes:     General:        Right eye: No discharge.        Left eye: No discharge.  Neck:     Vascular: No JVD.  Cardiovascular:     Rate and Rhythm: Normal rate and regular rhythm.     Pulses:          Posterior tibial pulses are 2+ on the right side and 2+ on the left side.     Heart sounds: Normal heart sounds, S1 normal and S2 normal. Heart sounds not distant. No midsystolic click and no opening snap. No murmur heard.    No friction rub.  Pulmonary:     Effort: Pulmonary effort  is normal. No respiratory distress.     Breath sounds: Normal breath sounds. No decreased breath sounds, wheezing or rales.  Chest:     Chest wall: No tenderness.  Abdominal:     General: There is no distension.  Musculoskeletal:     Cervical back: Normal range of motion.     Right lower leg: No edema.     Left lower leg: No edema.  Skin:    General: Skin is warm and dry.     Nails: There is no clubbing.  Neurological:     Mental Status: She is alert and oriented to person, place, and time.  Psychiatric:        Speech: Speech normal.        Behavior: Behavior normal.        Thought Content: Thought content normal.        Judgment: Judgment normal.     Wt Readings from Last 3 Encounters:  05/14/22 192 lb (87.1 kg)  04/19/22 192 lb 9 oz (87.3 kg)  04/16/22 191 lb 3 oz (86.7 kg)     ASSESSMENT & PLAN:   Persistent Afib: Maintaining sinus rhythm in the office today.  It appears she may have pharmacologically converted to sinus rhythm shortly after starting amiodarone.  She has had to hold metoprolol at times while in sinus rhythm secondary to bradycardic heart rates.  Decrease amiodarone to 200 mg daily.  We will transition her from metoprolol to carvedilol as outlined below for added BP effect and less AV nodal blocking in an effort to improve blood pressure and minimize interruptions in antihypertensive therapy.  Recommend repeating a TSH and LFT in follow-up given amiodarone use.  CHA2DS2-VASc at least 5 (HTN, age x1, diabetes, vascular disease, sex category).  She remains on apixaban 5 mg twice daily (does not meet reduced dosing criteria) without symptoms concerning for bleeding with stable CBC.  Coronary artery calcification/HLD: No symptoms concerning for angina or decompensation.  LDL 73.  She remains on simvastatin and ezetimibe.  She is on apixaban in place of aspirin given A-fib, in an effort to minimize bleeding risk.  HTN: Blood pressure remains elevated in the office  today.  Blood pressure fluctuations at home are likely exacerbated by intermittent holding of metoprolol secondary to bradycardic heart rates.  In an effort to minimize interruptions in antihypertensive therapy we will transition her from Toprol-XL to carvedilol 12.5 mg twice daily.  We may need to further titrate this pending blood pressure and heart rate trend.  She will otherwise continue current dose lisinopril and Cardura.  She will contact our office via [place] in 1 to 2 weeks with blood pressure and heart rate  readings.  CKD stage IIIb-IV: Avoid nephrotoxic agents.  Followed by nephrology with prior documentation indicating her creatinine improved to 1.8 on their check.    Disposition: F/u with Dr. Rockey Situ or an APP in 4 to 6 weeks.   Medication Adjustments/Labs and Tests Ordered: Current medicines are reviewed at length with the patient today.  Concerns regarding medicines are outlined above. Medication changes, Labs and Tests ordered today are summarized above and listed in the Patient Instructions accessible in Encounters.   Signed, Christell Faith, PA-C 05/14/2022 12:05 PM     [place] [place], [place] [address] [phone]

## 2022-05-14 ENCOUNTER — Ambulatory Visit (INDEPENDENT_AMBULATORY_CARE_PROVIDER_SITE_OTHER): Payer: Medicare Other | Admitting: Physician Assistant

## 2022-05-14 ENCOUNTER — Encounter: Payer: Self-pay | Admitting: Physician Assistant

## 2022-05-14 VITALS — BP 160/80 | HR 70 | Ht 60.0 in | Wt 192.0 lb

## 2022-05-14 DIAGNOSIS — N1832 Chronic kidney disease, stage 3b: Secondary | ICD-10-CM | POA: Diagnosis not present

## 2022-05-14 DIAGNOSIS — I2510 Atherosclerotic heart disease of native coronary artery without angina pectoris: Secondary | ICD-10-CM

## 2022-05-14 DIAGNOSIS — E785 Hyperlipidemia, unspecified: Secondary | ICD-10-CM

## 2022-05-14 DIAGNOSIS — I10 Essential (primary) hypertension: Secondary | ICD-10-CM

## 2022-05-14 DIAGNOSIS — I2584 Coronary atherosclerosis due to calcified coronary lesion: Secondary | ICD-10-CM

## 2022-05-14 DIAGNOSIS — I4819 Other persistent atrial fibrillation: Secondary | ICD-10-CM

## 2022-05-14 MED ORDER — CARVEDILOL 12.5 MG PO TABS: 12.5000 mg | ORAL_TABLET | Freq: Two times a day (BID) | ORAL | 3 refills | Status: DC

## 2022-05-14 MED ORDER — AMIODARONE HCL 200 MG PO TABS: 200.0000 mg | ORAL_TABLET | Freq: Every day | ORAL | 3 refills | Status: DC

## 2022-05-14 NOTE — Patient Instructions (Signed)
Medication Instructions:  Your physician has recommended you make the following change in your medication:   DECREASE Amiodarone to 200 mg once daily STOP Metoprolol START Carvedilol (Coreg) 12.5 mg twice daily  *If you need a refill on your cardiac medications before your next appointment, please call your pharmacy*   Lab Work: None  If you have labs (blood work) drawn today and your tests are completely normal, you will receive your results only by: [place] (if you have MyChart) OR A paper copy in the mail If you have any lab test that is abnormal or we need to change your treatment, we will call you to review the results.   Testing/Procedures: None   Follow-Up: At [facility], you and your health needs are our priority.  As part of our continuing mission to provide you with exceptional heart care, we have created designated Provider Care Teams.  These Care Teams include your primary Cardiologist (physician) and Advanced Practice Providers (APPs -  Physician Assistants and Nurse Practitioners) who all work together to provide you with the care you need, when you need it.   Your next appointment:   4-6 week(s)  The format for your next appointment:   In Person  Provider:   Ida Rogue, MD or Christell Faith, PA-C       Important Information About Sugar

## 2022-05-18 DIAGNOSIS — M545 Low back pain, unspecified: Secondary | ICD-10-CM | POA: Diagnosis not present

## 2022-05-20 DIAGNOSIS — M545 Low back pain, unspecified: Secondary | ICD-10-CM | POA: Diagnosis not present

## 2022-05-21 ENCOUNTER — Encounter: Payer: Self-pay | Admitting: Internal Medicine

## 2022-05-24 ENCOUNTER — Other Ambulatory Visit: Payer: Self-pay | Admitting: *Deleted

## 2022-05-24 MED ORDER — DOXAZOSIN MESYLATE 2 MG PO TABS: 2.0000 mg | ORAL_TABLET | Freq: Every day | ORAL | 3 refills | Status: DC

## 2022-05-26 DIAGNOSIS — M545 Low back pain, unspecified: Secondary | ICD-10-CM | POA: Diagnosis not present

## 2022-05-28 DIAGNOSIS — M545 Low back pain, unspecified: Secondary | ICD-10-CM | POA: Diagnosis not present

## 2022-05-31 ENCOUNTER — Encounter: Payer: Self-pay | Admitting: Internal Medicine

## 2022-06-02 DIAGNOSIS — M545 Low back pain, unspecified: Secondary | ICD-10-CM | POA: Diagnosis not present

## 2022-06-03 ENCOUNTER — Telehealth: Payer: Self-pay | Admitting: *Deleted

## 2022-06-03 ENCOUNTER — Other Ambulatory Visit: Payer: Self-pay | Admitting: *Deleted

## 2022-06-03 MED ORDER — LISINOPRIL 20 MG PO TABS: 20.0000 mg | ORAL_TABLET | Freq: Two times a day (BID) | ORAL | 3 refills | Status: DC

## 2022-06-03 NOTE — Patient Outreach (Signed)
  Care Coordination   06/03/2022 Name: Brenda [name] MRN: [id number] DOB: 25-Nov-1950   Care Coordination Outreach Attempts:  An unsuccessful telephone outreach was attempted today to offer the patient information about available care coordination services as a benefit of their health plan.   Follow Up Plan:  Additional outreach attempts will be made to offer the patient care coordination information and services.   Encounter Outcome:  No Answer  Care Coordination Interventions Activated:  No   Care Coordination Interventions:  No, not indicated    Eduard Clos MSW, LCSW Licensed Clinical Social Worker      [phone]

## 2022-06-04 DIAGNOSIS — M545 Low back pain, unspecified: Secondary | ICD-10-CM | POA: Diagnosis not present

## 2022-06-08 DIAGNOSIS — M545 Low back pain, unspecified: Secondary | ICD-10-CM | POA: Diagnosis not present

## 2022-06-10 DIAGNOSIS — M545 Low back pain, unspecified: Secondary | ICD-10-CM | POA: Diagnosis not present

## 2022-06-11 ENCOUNTER — Other Ambulatory Visit: Payer: Self-pay | Admitting: *Deleted

## 2022-06-11 ENCOUNTER — Ambulatory Visit: Payer: Medicare Other | Admitting: *Deleted

## 2022-06-11 DIAGNOSIS — E118 Type 2 diabetes mellitus with unspecified complications: Secondary | ICD-10-CM

## 2022-06-11 DIAGNOSIS — C9110 Chronic lymphocytic leukemia of B-cell type not having achieved remission: Secondary | ICD-10-CM

## 2022-06-11 DIAGNOSIS — I10 Essential (primary) hypertension: Secondary | ICD-10-CM

## 2022-06-11 NOTE — Chronic Care Management (AMB) (Signed)
Care Management    RN Visit Note  06/11/2022 Name: Brenda [name] MRN: [id number] DOB: 19-Oct-1951  Subjective: Brenda [name] is a 71 y.o. year old female who is a primary care patient of Brenda Koch, MD. The care management team was consulted for assistance with disease management and care coordination needs.    Engaged with patient by telephone for follow up visit/ RN CM case closure in response to provider referral for case management and/or care coordination services.   Consent to Services:   [name] was given information about Care Management services 04/30/21 including:  Care Management services includes personalized support from designated clinical staff supervised by her physician, including individualized plan of care and coordination with other care providers 24/7 contact phone numbers for assistance for urgent and routine care needs. The patient may stop case management services at any time by phone call to the office staff.  Patient agreed to services and consent obtained.   Assessment: Review of patient past medical history, allergies, medications, health status, including review of consultants reports, laboratory and other test data, was performed as part of comprehensive evaluation and provision of chronic care management services.   SDOH (Social Determinants of Health) assessments and interventions performed:  SDOH Interventions    Flowsheet Row Most Recent Value  SDOH Interventions   Food Insecurity Interventions Intervention Not Indicated  Transportation Interventions Intervention Not Indicated  [continues to drive self]     Care Plan  Allergies  Allergen Reactions   Gadolinium Derivatives Other (See Comments)   Nsaids Other (See Comments)    Due to kidney function   Amlodipine     Swelling     Contrast Media [Iodinated Contrast Media] Other (See Comments)    Patient states unable to take / use due to kidney function   Outpatient  Encounter Medications as of 06/11/2022  Medication Sig   acetaminophen (TYLENOL) 500 MG tablet Take 500-1,000 mg by mouth every 6 (six) hours as needed for mild pain, moderate pain or fever.    ALPRAZolam (XANAX) 1 MG tablet TAKE 1/2 TO 1 TABLET(0.5 TO 1 MG) BY MOUTH TWICE DAILY AS NEEDED FOR ANXIETY   amiodarone (PACERONE) 200 MG tablet Take 1 tablet (200 mg total) by mouth daily.   BD PEN NEEDLE NANO 2ND GEN 32G X 4 MM MISC USE DAILY AS DIRECTED   Blood Glucose Monitoring Suppl (ONETOUCH VERIO) w/Device KIT Use as advised   buPROPion (WELLBUTRIN XL) 300 MG 24 hr tablet Take 1 tablet (300 mg total) by mouth daily.   carvedilol (COREG) 12.5 MG tablet Take 1 tablet (12.5 mg total) by mouth 2 (two) times daily with a meal.   Cholecalciferol (VITAMIN D3) 2000 units TABS Take 2,000 mcg by mouth daily.   Cyanocobalamin (B-12) 1000 MCG SUBL Place 1,000 mcg under the tongue every evening.    doxazosin (CARDURA) 2 MG tablet Take 1 tablet (2 mg total) by mouth daily.   ELIQUIS 5 MG TABS tablet TAKE 1 TABLET(5 MG) BY MOUTH TWICE DAILY   esomeprazole (NEXIUM) 20 MG capsule Take 20 mg by mouth daily as needed (acid reflux symptoms). M-W-F   ezetimibe (ZETIA) 10 MG tablet Take 1 tablet (10 mg total) by mouth daily.   glipiZIDE (GLUCOTROL XL) 2.5 MG 24 hr tablet Take 1 tablet (2.5 mg total) by mouth 2 (two) times daily at 8 am and 10 pm.   glucose blood (ONETOUCH VERIO) test strip USE AS DIRECTED TO TEST BLOOD SUGAR THREE  TIMES DAILY   insulin glargine (LANTUS SOLOSTAR) 100 UNIT/ML Solostar Pen Inject 24 Units into the skin daily.   iron polysaccharides (NIFEREX) 150 MG capsule Take 150 mg by mouth at bedtime.    JARDIANCE 10 MG TABS tablet TAKE 1 TABLET(10 MG) BY MOUTH DAILY BEFORE BREAKFAST   Lancet Devices (LANCING DEVICE) MISC Use as advised - Freestyle   Lancets (ONETOUCH DELICA PLUS EXHBZJ69C) MISC CHECK BLOOD SUGAR THREE TIMES DAILY AS DIRECTED   lisinopril (ZESTRIL) 20 MG tablet Take 1 tablet (20 mg  total) by mouth 2 (two) times daily.   metoprolol succinate (TOPROL-XL) 25 MG 24 hr tablet Take 25 mg by mouth in the morning and at bedtime. HOLD if HR <60   NONFORMULARY OR COMPOUNDED ITEM Apply 1 application topically 2 (two) times daily as needed (sun damage on face). FLUOROURACIL 5% + CALCIPOTRIENE 0.005%   simvastatin (ZOCOR) 40 MG tablet TAKE 1 TABLET(40 MG) BY MOUTH AT BEDTIME   triamcinolone cream (KENALOG) 0.1 % Apply 1 application topically 2 (two) times daily as needed (skin rash.).    No facility-administered encounter medications on file as of 06/11/2022.   Patient Active Problem List   Diagnosis Date Noted   Osteopenia 05/22/2020   Well woman exam without gynecological exam 05/02/2019   SCC (squamous cell carcinoma), hand, right 05/02/2019   CKD (chronic kidney disease) stage 3, GFR 30-59 ml/min ([place]) 07/05/2018   CLL (chronic lymphocytic leukemia) ([place]) 03/13/2018   Anxiety 03/13/2018   Bilateral edema of lower extremity 01/18/2018   History of endometrial cancer 11/18/2017   Pelvic lymphadenopathy 11/18/2017   Daytime sleepiness 08/22/2017   Coronary artery disease of native artery of native heart with stable angina pectoris ([place]) 04/03/2017   S/P shoulder replacement, right 08/19/2016   Diabetes mellitus type 2 with complications ([place]) 78/93/8101   Morbid obesity ([place]) 01/24/2015   Chronic radicular low back pain 04/01/2014   Tachycardia 01/31/2013   OA (osteoarthritis) 09/20/2011   Hyperlipidemia associated with type 2 diabetes mellitus ([place]) 11/06/2010   Depression with anxiety 11/06/2010   Essential hypertension 11/06/2010   GERD 11/06/2010   Conditions to be addressed/monitored: Atrial Fibrillation, HTN, and DMII  Care Plan : RN Care Manager Plan of Care  Updates made by Knox Royalty, RN since 06/11/2022 12:00 AM     Problem: Chronic Disease Management Needs   Priority: High     Long-Range Goal: Ongoing adherence to established plan of care for long  term chronic disease management   Start Date: 01/28/2022  Expected End Date: 01/29/2023  Priority: High  Note:   Current Barriers:  Chronic Disease Management support and education needs related to HTN and DMII Lives alone- no local family Chronic back pain: currently followed by established orthopedic provider; with pain management referral pending 06/11/22: reports has been attending outpatient rehab/ PT: states "it has really been helping"  RNCM Clinical Goal(s):  Patient will demonstrate ongoing health management independence as evidenced by adherence to plan of care for DMII; HTN/ HLD        through collaboration with RN Care manager, provider, and care team.   Interventions: 1:1 collaboration with primary care provider regarding development and update of comprehensive plan of care as evidenced by provider attestation and co-signature Inter-disciplinary care team collaboration (see longitudinal plan of care) Evaluation of current treatment plan related to  self management and patient's adherence to plan as established by provider 01/28/22: CCM RN CM initial assessment updated/ completed Review of patient status, including review  of consultants reports, relevant laboratory and other test results, and medications completed SDOH updated: no new/ unmet concerns identified Pain assessment updated: continues to report ongoing chronic back pain when standing or walking; she is currently sitting down during our telephone visit and denies pain; as above, reports recent outpatient rehab/ PT sessions "have been very helpful- I am less stiff and the swelling in my (L) ankle/ leg has also improved"  Re- confirms she is now established with pain management clinic provider [facility]; Dr. Nelva Bush; continues taking Tramadol as/ if needed Falls assessment updated: continues to deny new/ recent falls since August 2022; confirms she is one year free from falls-- not currently using assistive devices;   positive reinforcement provided with encouragement to continue efforts at fall prevention; previously provided education around fall risks/ prevention reinforced Medications discussed: reports continues to independently self-manage and denies current concerns/ issues/ questions around medications; endorses adherence to taking all medications as prescribed Confirmed patient has excellent understanding of recent changes to medications by cardiology provider: she is a retired Therapist, 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 Arman Filter nurse called me yesterday and I think the issue has finally been resolved" Reviewed upcoming scheduled provider appointments: 06/16/22- oncology; 07/07/22- cardiology provider; 07/27/22- endocrinology; 10/15/22- PCP; patient confirms is aware of all and has plans to attend as scheduled Discussed plans with patient for ongoing care management follow up- patient denies current care coordination/ care management needs and is agreeable to RN CM case closure today; verbalizes understanding to contact PCP or other care providers for any needs that arise in the future, and confirms she has contact information for all care providers         Diabetes:  (Status: 06/11/2022: Goal Met.) Long Term Goal   Lab Results  Component Value Date   HGBA1C 6.9 (A) 03/23/2022    Reviewed prescribed diet with patient carbohydrate modified, low sugar, heart healthy, low salt/ low cholesterol; patient continues to report dietary adherence Confirmed patient continues to monitor and write down on paper blood sugars at home three times per day Reviewed recent fasting blood sugars with patient: she reports "no changes" and again reports general ranges between 78-110; denies recent episodes of hypoglycemia Reinforced  previously provided education around signs/ symptoms low blood sugar along with corresponding action plan: she is a retired Therapist, 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 Discussed medications for DMII: she continues to verbalize a very good understanding of medications and denies questions/ concerns  Hypertension/ A-Fib: (Status: 06/11/22: Goal Met.) Long Term Goal  Last practice recorded BP readings:  BP Readings from Last 3 Encounters:  05/14/22 (!) 160/80  04/19/22 132/80  04/16/22 132/74  Most recent eGFR/CrCl: No results found for: EGFR  No components found for: CRCL  Evaluation of current treatment plan related to hypertension self management and patient's adherence to plan as established by provider;   Discussed complications of poorly controlled blood pressure such as heart disease, stroke, circulatory complications, vision complications, kidney impairment, sexual dysfunction;  Confirmed patient monitors/ writes down on paper blood pressures at home: she reports all blood pressures had dropped "lower than usual" with a recent bout of AF: confirms she has made recommended medication changes from cardiology provider office visit 05/14/22-- we reviewed this visit and subsequent messaging around medications; patient does not have any  specific blood pressure values from home monitoring to share, but tells me "blood pressures have been okay for the most part" since medications have been changed Reinforced previously provided education and confirmed patient able to verbalize signs/ symptoms A-Fib, along with corresponding action plan for same-- she is a retired Therapist, 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 about signs/ symptoms/ risks of being in A-Fib, as well as action plan for same; able to verbalize reasons she would go to ED; she confirms her heart rate values have "been between 60-70" since recent medication changes made during cardiology provider office visit 05/14/22     Plan:  No further follow up required:  patient denies current care coordination/ care management needs and is agreeable to RN CM case closure today; RN CM case closure accordingly     Oneta Rack, RN, BSN, [place] [phone]: direct office

## 2022-06-15 ENCOUNTER — Encounter: Payer: Self-pay | Admitting: Internal Medicine

## 2022-06-16 ENCOUNTER — Other Ambulatory Visit: Payer: Self-pay

## 2022-06-16 ENCOUNTER — Inpatient Hospital Stay: Payer: Medicare Other | Attending: Hematology

## 2022-06-16 ENCOUNTER — Inpatient Hospital Stay (HOSPITAL_BASED_OUTPATIENT_CLINIC_OR_DEPARTMENT_OTHER): Payer: Medicare Other | Admitting: Hematology

## 2022-06-16 VITALS — BP 134/61 | HR 65 | Temp 97.7°F | Resp 18 | Wt 197.6 lb

## 2022-06-16 DIAGNOSIS — D509 Iron deficiency anemia, unspecified: Secondary | ICD-10-CM | POA: Diagnosis not present

## 2022-06-16 DIAGNOSIS — I129 Hypertensive chronic kidney disease with stage 1 through stage 4 chronic kidney disease, or unspecified chronic kidney disease: Secondary | ICD-10-CM | POA: Insufficient documentation

## 2022-06-16 DIAGNOSIS — E1122 Type 2 diabetes mellitus with diabetic chronic kidney disease: Secondary | ICD-10-CM | POA: Insufficient documentation

## 2022-06-16 DIAGNOSIS — K8020 Calculus of gallbladder without cholecystitis without obstruction: Secondary | ICD-10-CM | POA: Diagnosis not present

## 2022-06-16 DIAGNOSIS — Z90710 Acquired absence of both cervix and uterus: Secondary | ICD-10-CM | POA: Diagnosis not present

## 2022-06-16 DIAGNOSIS — D696 Thrombocytopenia, unspecified: Secondary | ICD-10-CM | POA: Diagnosis not present

## 2022-06-16 DIAGNOSIS — C9110 Chronic lymphocytic leukemia of B-cell type not having achieved remission: Secondary | ICD-10-CM | POA: Insufficient documentation

## 2022-06-16 DIAGNOSIS — R918 Other nonspecific abnormal finding of lung field: Secondary | ICD-10-CM | POA: Diagnosis not present

## 2022-06-16 DIAGNOSIS — I700 Atherosclerosis of aorta: Secondary | ICD-10-CM | POA: Diagnosis not present

## 2022-06-16 DIAGNOSIS — N189 Chronic kidney disease, unspecified: Secondary | ICD-10-CM | POA: Diagnosis not present

## 2022-06-16 DIAGNOSIS — Z803 Family history of malignant neoplasm of breast: Secondary | ICD-10-CM | POA: Insufficient documentation

## 2022-06-16 DIAGNOSIS — Z8542 Personal history of malignant neoplasm of other parts of uterus: Secondary | ICD-10-CM | POA: Diagnosis not present

## 2022-06-16 LAB — CMP (CANCER CENTER ONLY)
ALT: 10 U/L (ref 0–44)
AST: 14 U/L — ABNORMAL LOW (ref 15–41)
Albumin: 4.4 g/dL (ref 3.5–5.0)
Alkaline Phosphatase: 63 U/L (ref 38–126)
Anion gap: 5 (ref 5–15)
BUN: 35 mg/dL — ABNORMAL HIGH (ref 8–23)
CO2: 30 mmol/L (ref 22–32)
Calcium: 9.5 mg/dL (ref 8.9–10.3)
Chloride: 106 mmol/L (ref 98–111)
Creatinine: 2.02 mg/dL — ABNORMAL HIGH (ref 0.44–1.00)
GFR, Estimated: 26 mL/min — ABNORMAL LOW (ref >60)
Glucose, Bld: 126 mg/dL — ABNORMAL HIGH (ref 70–99)
Potassium: 5 mmol/L (ref 3.5–5.1)
Sodium: 141 mmol/L (ref 135–145)
Total Bilirubin: 0.6 mg/dL (ref 0.3–1.2)
Total Protein: 6.1 g/dL — ABNORMAL LOW (ref 6.5–8.1)

## 2022-06-16 LAB — CBC WITH DIFFERENTIAL (CANCER CENTER ONLY)
Abs Immature Granulocytes: 0.06 10*3/uL (ref 0.00–0.07)
Basophils Absolute: 0.1 10*3/uL (ref 0.0–0.1)
Basophils Relative: 0 %
Eosinophils Absolute: 0.2 10*3/uL (ref 0.0–0.5)
Eosinophils Relative: 1 %
HCT: 38.9 % (ref 36.0–46.0)
Hemoglobin: 12.1 g/dL (ref 12.0–15.0)
Immature Granulocytes: 0 %
Lymphocytes Relative: 76 %
Lymphs Abs: 13.5 10*3/uL — ABNORMAL HIGH (ref 0.7–4.0)
MCH: 29.4 pg (ref 26.0–34.0)
MCHC: 31.1 g/dL (ref 30.0–36.0)
MCV: 94.4 fL (ref 80.0–100.0)
Monocytes Absolute: 0.6 10*3/uL (ref 0.1–1.0)
Monocytes Relative: 4 %
Neutro Abs: 3.4 10*3/uL (ref 1.7–7.7)
Neutrophils Relative %: 19 %
Platelet Count: 119 10*3/uL — ABNORMAL LOW (ref 150–400)
RBC: 4.12 MIL/uL (ref 3.87–5.11)
RDW: 13.9 % (ref 11.5–15.5)
Smear Review: NORMAL
WBC Count: 17.8 10*3/uL — ABNORMAL HIGH (ref 4.0–10.5)
nRBC: 0 % (ref 0.0–0.2)

## 2022-06-16 LAB — LACTATE DEHYDROGENASE: LDH: 159 U/L (ref 98–192)

## 2022-06-18 ENCOUNTER — Other Ambulatory Visit: Payer: Self-pay | Admitting: Internal Medicine

## 2022-06-18 DIAGNOSIS — M545 Low back pain, unspecified: Secondary | ICD-10-CM | POA: Diagnosis not present

## 2022-06-22 NOTE — Progress Notes (Signed)
HEMATOLOGY/ONCOLOGY CLINIC NOTE  Date of Service: .06/16/2022   Patient Care Team: [name], MD as PCP - General (Internal Medicine) Rockey Situ Kathlene November, MD as PCP - Cardiology (Cardiology) Minna Merritts, MD as Consulting Physician (Cardiology)  CHIEF COMPLAINTS/PURPOSE OF CONSULTATION:  Follow-up for continued evaluation and management of CLL  HISTORY OF PRESENTING ILLNESS:  Please see previous note for details on initial presentation Interval History:  Brenda [name] is here for continued evaluation and management of CLL.  Her last clinic visit with [place] was about 6 months ago. She notes no acute new symptoms suggestive of CLL progression. No fevers no chills no night sweats. No new lumps or bumps. No new shortness of breath or chest pain.  No new abdominal pain or distention. Patient notes diabetes and atrial fibrillation well controlled. Patient had labs done today were discussed with her in detail.     MEDICAL HISTORY:  Past Medical History:  Diagnosis Date   Anxiety    Arthritis    Atrial fibrillation (HCC)    CLL (chronic lymphocytic leukemia) ([place])    Dx. 2019 Dr. 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 Particia Nearing first visit 11-2019    SURGICAL HISTORY: Past Surgical History:  Procedure Laterality Date   ABDOMINAL HYSTERECTOMY  1998   COLONOSCOPY     EYE SURGERY Bilateral    cataracts   EYE SURGERY Left    retina tear   FEMUR IM NAIL  10/25/2012   Procedure: INTRAMEDULLARY (IM) NAIL FEMORAL;  Surgeon: Mauri Pole, MD;  Location: [facility];  Service: Orthopedics;  Laterality: Left;   HIP SURGERY     Fracture L IM nail and 3 rods   left knee arthroscopy  12/2010   LYMPH NODE BIOPSY     axillary left 12-2018   REFRACTIVE SURGERY     skin cancer removed right and and lower forearm     squamoun in situ   TOTAL KNEE ARTHROPLASTY  12/20/2011   Procedure: TOTAL KNEE ARTHROPLASTY;  Surgeon: Gearlean Alf, MD;  Location: [facility];  Service: Orthopedics;  Laterality: Left;  Failed attempt at spinal    TOTAL SHOULDER ARTHROPLASTY Right 08/19/2016   Procedure: RIGHT TOTAL SHOULDER ARTHROPLASTY;  Surgeon: Justice Britain, MD;  Location: [place];  Service: Orthopedics;  Laterality: Right;   TOTAL SHOULDER ARTHROPLASTY Left 01/10/2020   Procedure: TOTAL SHOULDER ARTHROPLASTY;  Surgeon: Justice Britain, MD;  Location: [facility];  Service: Orthopedics;  Laterality: Left;  124mn    SOCIAL HISTORY:  Social History   Socioeconomic History   Marital status: Single    Spouse name: Not on file   Number of children: Not on file   Years of education: Not on file   Highest education level: Not on file  Occupational History   Occupation: Therapist, 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    Employer: [employer]  Tobacco Use   Smoking status: Never   Smokeless tobacco: Never  Vaping Use   Vaping Use: Never used  Substance and Sexual Activity   Alcohol use: No   Drug use: No   Sexual activity: Not Currently  Other Topics Concern   Not on file  Social History Narrative   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            Social Determinants of Health   Financial Resource Strain: Low Risk  (03/10/2022)   Overall Financial Resource Strain  (CARDIA)    Difficulty of Paying Living Expenses: Not hard at all  Food Insecurity: No Food Insecurity (06/11/2022)   Hunger Vital Sign    Worried About Running Out of Food in the Last Year: Never true    Ran Out of Food in the Last Year: Never true  Transportation Needs: No Transportation Needs (06/11/2022)   PRAPARE - Hydrologist (Medical): No    Lack of Transportation (Non-Medical): No  Physical Activity: Inactive (03/10/2022)   Exercise Vital Sign    Days of Exercise per Week: 0 days    Minutes of Exercise per Session: 0 min  Stress: No Stress Concern Present (03/10/2022)   [place]    Feeling of Stress : Not at all  Social Connections: Moderately Isolated (03/10/2022)   Social Connection and Isolation Panel [NHANES]    Frequency of Communication with Friends and Family: More than three times a week    Frequency of Social Gatherings with Friends and Family: More than three times a week    Attends Religious Services: More than 4 times per year    Active Member of [employer] or Organizations: No    Attends Archivist Meetings: Never    Marital Status: Never married  Intimate Partner Violence: Not At Risk (03/10/2022)   Humiliation, Afraid, Rape, and Kick questionnaire    Fear of Current or Ex-Partner: No    Emotionally Abused: No    Physically Abused: No    Sexually Abused: No    FAMILY HISTORY: Family History  Problem Relation Age of Onset   Cancer Mother        breast cancer   Cancer Father        plasma sarcoma   Breast cancer Neg Hx     ALLERGIES:  is allergic to gadolinium derivatives, nsaids, amlodipine, and contrast media [iodinated contrast media].  MEDICATIONS:  Current Outpatient Medications  Medication Sig Dispense Refill   acetaminophen (TYLENOL) 500 MG tablet Take 500-1,000 mg by mouth every 6 (six) hours as needed for mild pain, moderate pain or fever.       ALPRAZolam (XANAX) 1 MG tablet TAKE 1/2 TO 1 TABLET(0.5 TO 1 MG) BY MOUTH TWICE DAILY AS NEEDED FOR ANXIETY 60 tablet 5   amiodarone (PACERONE) 200 MG tablet Take 1 tablet (200 mg total) by mouth daily. 90 tablet 3   BD PEN NEEDLE NANO 2ND GEN 32G X 4 MM MISC USE DAILY AS DIRECTED 100 each 3   Blood Glucose Monitoring Suppl (ONETOUCH VERIO)  w/Device KIT Use as advised 1 kit 0   buPROPion (WELLBUTRIN XL) 300 MG 24 hr tablet Take 1 tablet (300 mg total) by mouth daily. 90 tablet 3   carvedilol (COREG) 12.5 MG tablet Take 1 tablet (12.5 mg total) by mouth 2 (two) times daily with a meal. 180 tablet 3   Cholecalciferol (VITAMIN D3) 2000 units TABS Take 2,000 mcg by mouth daily.     Cyanocobalamin (B-12) 1000 MCG SUBL Place 1,000 mcg under the tongue every evening.      doxazosin (CARDURA) 2 MG tablet Take 1 tablet (2 mg total) by mouth daily. 90 tablet 3   ELIQUIS 5 MG TABS tablet TAKE 1 TABLET(5 MG) BY MOUTH TWICE DAILY 60 tablet 5   esomeprazole (NEXIUM) 20 MG capsule Take 20 mg by mouth daily as needed (acid reflux symptoms). M-W-F     ezetimibe (ZETIA) 10 MG tablet Take 1 tablet (10 mg total) by mouth daily. 90 tablet 3   glipiZIDE (GLUCOTROL XL) 2.5 MG 24 hr tablet Take 1 tablet (2.5 mg total) by mouth 2 (two) times daily at 8 am and 10 pm. 180 tablet 1   glucose blood (ONETOUCH VERIO) test strip TEST BLOOD SUGAR THREE TIMES DAILY AS DIRECTED 300 strip 1   insulin glargine (LANTUS SOLOSTAR) 100 UNIT/ML Solostar Pen Inject 24 Units into the skin daily. 30 mL 3   iron polysaccharides (NIFEREX) 150 MG capsule Take 150 mg by mouth at bedtime.      JARDIANCE 10 MG TABS tablet TAKE 1 TABLET(10 MG) BY MOUTH DAILY BEFORE BREAKFAST 30 tablet 11   Lancet Devices (LANCING DEVICE) MISC Use as advised - Freestyle 1 each 0   Lancets (ONETOUCH DELICA PLUS [id number]) MISC CHECK BLOOD SUGAR THREE TIMES DAILY AS DIRECTED 300 each 1   lisinopril (ZESTRIL) 20 MG tablet Take 1 tablet (20 mg total) by mouth 2 (two)  times daily. 180 tablet 3   metoprolol succinate (TOPROL-XL) 25 MG 24 hr tablet Take 25 mg by mouth in the morning and at bedtime. HOLD if HR <60     NONFORMULARY OR COMPOUNDED ITEM Apply 1 application topically 2 (two) times daily as needed (sun damage on face). FLUOROURACIL 5% + CALCIPOTRIENE 0.005%     simvastatin (ZOCOR) 40 MG tablet TAKE 1 TABLET(40 MG) BY MOUTH AT BEDTIME 90 tablet 3   triamcinolone cream (KENALOG) 0.1 % Apply 1 application topically 2 (two) times daily as needed (skin rash.).      No current facility-administered medications for this visit.    REVIEW OF SYSTEMS:   10 Point review of Systems was done is negative except as noted above.    PHYSICAL EXAMINATION: ECOG PERFORMANCE STATUS: 1 - Symptomatic but completely ambulatory  Vitals:   06/16/22 0856  BP: 134/61  Pulse: 65  Resp: 18  Temp: 97.7 F (36.5 C)  SpO2: 93%    Filed Weights   06/16/22 0856  Weight: 197 lb 9.6 oz (89.6 kg)    .Body mass index is 38.59 kg/m. NAD GENERAL:alert, in no acute distress and comfortable SKIN: no acute rashes, no significant lesions EYES: conjunctiva are pink and non-injected, sclera anicteric OROPHARYNX: MMM, no exudates, no oropharyngeal erythema or ulceration NECK: supple, no JVD LYMPH:  no palpable lymphadenopathy in the cervical, axillary or inguinal regions LUNGS: clear to auscultation b/l with normal respiratory effort HEART: regular rate & rhythm ABDOMEN:  normoactive bowel sounds , non tender, not distended. Extremity: no pedal edema PSYCH: alert & oriented x 3  with fluent speech NEURO: no focal motor/sensory deficits  LABORATORY DATA:  I have reviewed the data as listed  .    Latest Ref Rng & Units 06/16/2022    8:29 AM 11/12/2021    8:46 AM 05/12/2021    8:31 AM  CBC  WBC 4.0 - 10.5 K/uL 17.8  15.9  11.9   Hemoglobin 12.0 - 15.0 g/dL 12.1  12.2  12.5   Hematocrit 36.0 - 46.0 % 38.9  39.5  39.7   Platelets 150 - 400 K/uL 119  143  145    CBC     Component Value Date/Time   WBC 17.8 (H) 06/16/2022 0829   WBC 11.9 (H) 05/12/2021 0831   RBC 4.12 06/16/2022 0829   HGB 12.1 06/16/2022 0829   HCT 38.9 06/16/2022 0829   PLT 119 (L) 06/16/2022 0829   MCV 94.4 06/16/2022 0829   MCH 29.4 06/16/2022 0829   MCHC 31.1 06/16/2022 0829   RDW 13.9 06/16/2022 0829   LYMPHSABS 13.5 (H) 06/16/2022 0829   MONOABS 0.6 06/16/2022 0829   EOSABS 0.2 06/16/2022 0829   BASOSABS 0.1 06/16/2022 0829       Latest Ref Rng & Units 06/16/2022    8:29 AM 11/12/2021    8:46 AM 07/20/2021    9:15 AM  CMP  Glucose 70 - 99 mg/dL 126  214    BUN 8 - 23 mg/dL 35  40    Creatinine 0.44 - 1.00 mg/dL 2.02  2.04    Sodium 135 - 145 mmol/L 141  140    Potassium 3.5 - 5.1 mmol/L 5.0  4.8    Chloride 98 - 111 mmol/L 106  107    CO2 22 - 32 mmol/L 30  27    Calcium 8.9 - 10.3 mg/dL 9.5  9.4    Total Protein 6.5 - 8.1 g/dL 6.1  6.0  6.0   Total Bilirubin 0.3 - 1.2 mg/dL 0.6  0.5  0.5   Alkaline Phos 38 - 126 U/L 63  91  94   AST 15 - 41 U/L 14  12  14    ALT 0 - 44 U/L 10  9  13     . Lab Results  Component Value Date   LDH 159 06/16/2022      12/15/17 FISH CLL Prognostic Panel:    01/03/19 Left axillary LN biopsy:    RADIOGRAPHIC STUDIES: I have personally reviewed the radiological images as listed and agreed with the findings in the report. No results found.  ASSESSMENT & PLAN:  Brenda [name] is a 71 y.o. 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 Lab Results  Component Value Date   LDH 159 06/16/2022   05/01/18 CT C/A/P revealed Mildly progressive lymphadenopathy in the chest, abdomen, and pelvis, corresponding to the patient's known CLL, as above. Dominant left external iliac node measures 3.3 cm short axis,  previously 2.8cm. Spleen is normal in size. Numerous bilateral pulmonary nodules measuring up to 9 mm, grossly unchanged from 2017. Prior bilateral pleural effusions have resolved.   09/22/18 [place] Bilateral Axilla revealed Ultrasound is performed, showing numerous enlarged LEFT axillary lymph nodes. Largest lymph node in the LEFT axilla has cortical thickening of 9 millimeters. Largest lymph node is 3.3 centimeters in length. Evaluation  of the contralateral axilla for comparison demonstrate lymph nodes with cortical thickening. Largest lymph node in the RIGHT axilla is 3.4 x 1.3 centimeters.  01/03/19 Left axillary LN biopsy revealed CLL  2. Multiple pulmonary nodules   CT AP on 11/17/17 - with 1. Pelvic lymphadenopathy and innumerable pulmonary nodules in the lung bases, consistent with metastatic disease. 2. Cholelithiasis without CT evidence of acute cholecystitis. 3.  Aortic atherosclerosis  CT Chest on 11/20/17 - with multiple small lung nodules in LLL that appear stable from prior CT in 12/2015 and are considered benign and Upper lung nodules also appear benign. Also with several borderline enlarged lymph nodes concerning for a lymphoproliferative disorder   3. H/o endometrial cancer in 1998 -localized to uterus -treated with surgery, abdominal hysterectomy  -no evidence of recurrence  4. Microcytic anemia - Fe deficiency  Presented in hospital with Hgb of 6.6 Hgb holding steady s/p 2U PRBC - Fe infusion completed 1/19 - stool hemoccult negative - suspect this may be due to CKD, but iron deficiency also worrisome for occult GIB  -Tolerated IV iron well  (hgb stable today).  5. Marginal B12 levels in hospital  (Antiparietal celll and anti IF Ab negative) -given one B12 injection in hospital -on replacement  PLAN:  -Patient's labs done today were reviewed in detail with her CBC shows gradual increase in WBC count now up to 17.8k hemoglobin within normal limits and stable at 12.1.  Mild  thrombocytopenia with platelets of 119k CMP with stable chronic kidney disease LDH within normal limits Patient has no clinical signs or symptoms of symptomatic CLL progression at this time. No clear indication to initiate CLL treatment at this time. Patient will continue to follow with PCP and cardiology for management of further medical issues.   FOLLOW UP:  RTC with Dr Irene Limbo with labs in 6 months   The total time spent in the appointment was 20 minutes*.  All of the patient's questions were answered with apparent satisfaction. The patient knows to call the clinic with any problems, questions or concerns.   Sullivan Lone MD MS AAHIVMS [facility] Hematology/Oncology Physician [facility]  .*Total Encounter Time as defined by the Centers for Medicare and Medicaid Services includes, in addition to the face-to-face time of a patient visit (documented in the note above) non-face-to-face time: obtaining and reviewing outside history, ordering and reviewing medications, tests or procedures, care coordination (communications with other health care professionals or caregivers) and documentation in the medical record.

## 2022-06-23 DIAGNOSIS — M545 Low back pain, unspecified: Secondary | ICD-10-CM | POA: Diagnosis not present

## 2022-06-29 ENCOUNTER — Telehealth: Payer: Medicare Other

## 2022-06-30 DIAGNOSIS — M545 Low back pain, unspecified: Secondary | ICD-10-CM | POA: Diagnosis not present

## 2022-07-06 NOTE — Progress Notes (Unsigned)
Cardiology Office Note    Date:  07/07/2022   ID:  Rorey, Hodges 06-Mar-1951, MRN [id number]  PCP:  [name], MD  Cardiologist:  Ida Rogue, MD  Electrophysiologist:  None   Chief Complaint: Follow-up  History of Present Illness:   Brenda [name] is a 71 y.o. female with history of coronary artery calcification by CT imaging, persistent Afib, DM2, HLD, CLL, CKD stage IIIb-IV, morbid obesity, and anemia who presents for follow up of Afib.   Calcium score in 12/2015 of 1031, which was the 97th percentile, with diffuse calcification in the entire LAD and RCA. Noncardiac overread also noted multiple pulmonary nodules scattered throughout the lungs bilaterally as noted below, along with a mildly enlarged right axillary lymph node with follow up imaging in 11/2017 showing stable pulmonary nodules as well as borderline to mildly enlarged axillary, subpectoral, and mediastinal lymph nodes. Subsequent PET scan noted to be consistent with CLL, confirmed by biopsy.   She was diagnosed with new onset Afib at office visit in 03/2021, and started on Eliquis, and was found to be in sinus rhythm in follow up in 04/2021. Echo from 05/2021 demonstrated an EF of 60-65%, no RWMA, normal LV diastolic function parameters, normal RV systolic function and ventricular cavity size, and mild mitral regurgitation.   She was seen in the office on 04/19/2022 and noted to be back in Afib with RVR, despite escalation of metoprolol via My Chart prior to the OV. She was started on amiodarone and continued on metoprolol and Eliquis.    She was last seen in the office in 05/2022 and was without symptoms of angina or decompensation.  Following the initiation of amiodarone, the next morning she noted her heart rates were down in the upper 40s bpm.  At times, she was having to hold her metoprolol due to heart rates less than 60 bpm.  EKG showed sinus rhythm.  Blood pressure readings were elevated at home and  felt to be exacerbated by intermittent holding of metoprolol.  She was transitioned from Toprol-XL to carvedilol.  Subsequent BP readings from home remain on the higher side leading to titration of carvedilol and lisinopril.  She comes in today and is without symptoms of angina or decompensation.  She continues to note dizziness that is typically worse in the mornings approximately 1 to 2 hours after taking her morning medications.  During this timeframe, her BP is frequently on the low side ranging from the low 409W to 11B systolic with a rare reading of 87 systolic.  Heart rates are typically in the mid to upper 50s to mid 60s bpm.  Current BP medication regimen includes lisinopril, carvedilol, and Cardura between 6 and 7 AM with her evening dose of lisinopril and carvedilol around 6 to 7 PM.  Afternoon blood pressures are typically on the higher side ranging from the 147W to 295A systolic.  No frank syncope.  Lower extremity swelling improved.  She has noted an occasional palpitations since she was last seen.  No falls, hematochezia, or melena.   Labs independently reviewed: 06/2022 - Hgb 12.1, PLT 119, potassium 5.0, BUN 35, serum creatinine 2.02, albumin 4.4, AST/ALT not elevated 04/2022 - TSH normal 03/2022 - A1c 6.9 07/2021 - TC 124, TG 122, HDL 29, LDL 73  Past Medical History:  Diagnosis Date   Anxiety    Arthritis    Atrial fibrillation (HCC)    CLL (chronic lymphocytic leukemia) (HCC)    Dx. 2019 Dr.  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 Particia Nearing first visit 11-2019    Past Surgical History:  Procedure Laterality Date   ABDOMINAL HYSTERECTOMY  1998   COLONOSCOPY     EYE SURGERY Bilateral    cataracts   EYE SURGERY Left    retina tear   FEMUR IM NAIL  10/25/2012   Procedure: INTRAMEDULLARY (IM) NAIL FEMORAL;  Surgeon: Mauri Pole, MD;  Location: [facility];  Service: Orthopedics;  Laterality: Left;   HIP SURGERY     Fracture L IM nail and 3 rods   left knee arthroscopy  12/2010   LYMPH NODE BIOPSY     axillary left 12-2018   REFRACTIVE SURGERY     skin cancer removed right and and lower forearm     squamoun in situ   TOTAL KNEE ARTHROPLASTY  12/20/2011   Procedure: TOTAL KNEE ARTHROPLASTY;  Surgeon: Gearlean Alf, MD;  Location: [facility];  Service: Orthopedics;  Laterality: Left;  Failed attempt at spinal    TOTAL SHOULDER ARTHROPLASTY Right 08/19/2016   Procedure: RIGHT TOTAL SHOULDER ARTHROPLASTY;  Surgeon: Justice Britain, MD;  Location: [place];  Service: Orthopedics;  Laterality: Right;   TOTAL SHOULDER ARTHROPLASTY Left 01/10/2020   Procedure: TOTAL SHOULDER ARTHROPLASTY;  Surgeon: Justice Britain, MD;  Location: [facility];  Service: Orthopedics;  Laterality: Left;  157mn    Current Medications: Current Meds  Medication Sig   acetaminophen (TYLENOL) 500 MG tablet Take 500-1,000 mg by mouth every 6 (six) hours as needed for mild pain, moderate pain or fever.    ALPRAZolam (XANAX) 1 MG tablet TAKE 1/2 TO 1 TABLET(0.5 TO 1 MG) BY MOUTH TWICE DAILY AS NEEDED FOR ANXIETY   BD PEN NEEDLE NANO 2ND GEN 32G X 4 MM MISC USE DAILY AS DIRECTED   Blood Glucose Monitoring Suppl (ONETOUCH VERIO) w/Device KIT Use as advised   buPROPion  (WELLBUTRIN XL) 300 MG 24 hr tablet Take 1 tablet (300 mg total) by mouth daily.   carvedilol (COREG) 12.5 MG tablet Take 1 tablet (12.5 mg total) by mouth 2 (two) times daily with a meal.   Cholecalciferol (VITAMIN D3) 2000 units TABS Take 2,000 mcg by mouth daily.   Cyanocobalamin (B-12) 1000 MCG SUBL Place 1,000 mcg under the tongue every evening.    ELIQUIS 5 MG TABS tablet TAKE 1 TABLET(5 MG) BY MOUTH TWICE DAILY   esomeprazole (NEXIUM) 20 MG capsule Take 20 mg by mouth daily as needed (acid reflux symptoms). M-W-F   ezetimibe (ZETIA) 10 MG  tablet Take 1 tablet (10 mg total) by mouth daily.   glucose blood (ONETOUCH VERIO) test strip TEST BLOOD SUGAR THREE TIMES DAILY AS DIRECTED   insulin glargine (LANTUS SOLOSTAR) 100 UNIT/ML Solostar Pen Inject 24 Units into the skin daily.   iron polysaccharides (NIFEREX) 150 MG capsule Take 150 mg by mouth at bedtime.    JARDIANCE 10 MG TABS tablet TAKE 1 TABLET(10 MG) BY MOUTH DAILY BEFORE BREAKFAST   Lancet Devices (LANCING DEVICE) MISC Use as advised - Freestyle   Lancets (ONETOUCH DELICA PLUS [id number]) MISC CHECK BLOOD SUGAR THREE TIMES DAILY AS DIRECTED   lisinopril (ZESTRIL) 20 MG tablet Take 1 tablet (20 mg total) by mouth 2 (two) times daily.   NONFORMULARY OR COMPOUNDED ITEM Apply 1 application topically 2 (two) times daily as needed (sun damage on face). FLUOROURACIL 5% + CALCIPOTRIENE 0.005%   simvastatin (ZOCOR) 40 MG tablet TAKE 1 TABLET(40 MG) BY MOUTH AT BEDTIME   triamcinolone cream (KENALOG) 0.1 % Apply 1 application topically 2 (two) times daily as needed (skin rash.).    [DISCONTINUED] amiodarone (PACERONE) 200 MG tablet Take 1 tablet (200 mg total) by mouth daily.   [DISCONTINUED] doxazosin (CARDURA) 2 MG tablet Take 1 tablet (2 mg total) by mouth daily.    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    Employer: [employer]  Tobacco Use   Smoking status: Never   Smokeless tobacco: Never  Vaping Use   Vaping Use: Never used  Substance and Sexual Activity   Alcohol use: No   Drug use: No   Sexual activity: Not Currently  Other Topics Concern   Not on file  Social History Narrative   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            Social Determinants of Health   Financial Resource Strain: Low Risk  (03/10/2022)   Overall Financial Resource Strain (CARDIA)    Difficulty of Paying Living Expenses: Not hard at all  Food Insecurity: No Food Insecurity (06/11/2022)   Hunger Vital Sign    Worried About Running Out of Food in the Last Year: Never true    [place] in the Last Year: Never true  Transportation Needs: No Transportation Needs (06/11/2022)   PRAPARE - Hydrologist (Medical): No    Lack of Transportation (Non-Medical): No  Physical Activity: Inactive (03/10/2022)   Exercise Vital Sign    Days of Exercise per Week: 0 days    Minutes of Exercise per Session: 0 min  Stress: No Stress Concern Present (03/10/2022)   [place]    Feeling of Stress : Not at all  Social Connections: Moderately Isolated (03/10/2022)   Social Connection and Isolation Panel [NHANES]    Frequency of Communication with Friends and Family: More than three times a week    Frequency of Social Gatherings with Friends and Family: More than three times a week    Attends Religious Services: More than 4 times per year    Active Member of Clubs or Organizations: No  Attends Archivist Meetings: Never    Marital Status: Never married     Family History:  The patient's family history includes Cancer in her father and  mother. There is no history of Breast cancer.  ROS:   12-point review of systems is negative unless otherwise noted in the HPI.   EKGs/Labs/Other Studies Reviewed:    Studies reviewed were summarized above. The additional studies were reviewed today:  2D echo 05/22/2021: 1. Left ventricular ejection fraction, by estimation, is 60 to 65%. The  left ventricle has normal function. The left ventricle has no regional  wall motion abnormalities. Left ventricular diastolic parameters were  normal.   2. Right ventricular systolic function is normal. The right ventricular  size is normal.   3. The mitral valve is normal in structure. Mild mitral valve  regurgitation. __________   Calcium score 01/13/2016:   Noncardiac over read: 1. There multiple pulmonary nodules scattered throughout the lungs bilaterally, the largest of which measures up to 9 mm in the left lower lobe. Non-contrast chest CT at 3-6 months is recommended. If the nodules are stable at time of repeat CT, then future CT at 18-24 months (from today's scan) is considered optional for low-risk patients, but is recommended for high-risk patients. This recommendation follows the consensus statement: Guidelines for Management of Incidental Pulmonary Nodules Detected on CT Images:From the Fleischner Society 2017; published online before print (10.1148/radiol.[id number]). 2. Mildly enlarged right axillary lymph node measuring 1 cm in short axis. This is nonspecific, but correlation with mammography is suggested in the near future. If mammography is abnormal, further evaluation with contrast enhanced CT of the chest, abdomen and pelvis could be considered to evaluate for metastatic disease. These results will be called to the ordering clinician or representative by the Radiologist Assistant, and communication documented in the PACS or zVision Dashboard.   Cardiac overread: IMPRESSION: Coronary calcium score of 1031. This was  97th percentile for age and sex matched control. Consider f/u perfusion study to further assess CAD See radiology report regarding right axillary lymph node and pulmonary nodules   EKG:  EKG is ordered today.  The EKG ordered today demonstrates sinus bradycardia with sinus arrhythmia versus second-degree AV block type I, 50 bpm, left axis deviation, baseline artifact, poor R wave progression along the precordial leads, no acute ST-T changes  Recent Labs: 04/19/2022: TSH 2.453 06/16/2022: ALT 10; BUN 35; Creatinine 2.02; Hemoglobin 12.1; Platelet Count 119; Potassium 5.0; Sodium 141  Recent Lipid Panel    Component Value Date/Time   CHOL 124 07/20/2021 0915   TRIG 122 07/20/2021 0915   HDL 29 (L) 07/20/2021 0915   CHOLHDL 4.3 07/20/2021 0915   CHOLHDL 5 05/15/2020 0827   VLDL 41.4 (H) 05/15/2020 0827   LDLCALC 73 07/20/2021 0915   LDLDIRECT 55.0 05/15/2020 0827    PHYSICAL EXAM:    VS:  BP 120/60 (BP Location: Left Arm, Patient Position: Sitting, Cuff Size: Normal)   Pulse (!) 50   Ht 5' (1.524 m)   Wt 187 lb 8 oz (85 kg)   SpO2 96%   BMI 36.62 kg/m   BMI: Body mass index is 36.62 kg/m.  Physical Exam Vitals reviewed.  Constitutional:      Appearance: She is well-developed.  HENT:     Head: Normocephalic and atraumatic.  Eyes:     General:        Right eye: No discharge.        Left eye: No discharge.  Neck:     Vascular: No JVD.  Cardiovascular:     Rate and Rhythm: Regular rhythm. Bradycardia present.     Pulses:          Posterior tibial pulses are 2+ on the right side and 2+ on the left side.     Heart sounds: Normal heart sounds, S1 normal and S2 normal. Heart sounds not distant. No midsystolic click and no opening snap. No murmur heard.    No friction rub.  Pulmonary:     Effort: Pulmonary effort is normal. No respiratory distress.     Breath sounds: Normal breath sounds. No decreased breath sounds, wheezing or rales.  Chest:     Chest wall: No tenderness.   Abdominal:     General: There is no distension.  Musculoskeletal:     Cervical back: Normal range of motion.  Skin:    General: Skin is warm and dry.     Nails: There is no clubbing.  Neurological:     Mental Status: She is alert and oriented to person, place, and time.  Psychiatric:        Speech: Speech normal.        Behavior: Behavior normal.        Thought Content: Thought content normal.        Judgment: Judgment normal.     Wt Readings from Last 3 Encounters:  07/07/22 187 lb 8 oz (85 kg)  06/16/22 197 lb 9.6 oz (89.6 kg)  05/14/22 192 lb (87.1 kg)     Orthostatic vital signs: Lying: 120/60, 47 bpm Sitting: 110/70, 49 bpm, lightheaded Standing: 100/50, 60 bpm, dizzy Standing x3 minutes: 110/60, 63 bpm  ASSESSMENT & PLAN:   Persistent A-fib: Maintaining sinus rhythm with a bradycardic rate.  Decrease amiodarone to 100 mg daily.  With bradycardic rates and intermittent palpitations, place a ZIO XT.  Continue carvedilol 12.5 mg twice daily with hold parameters for heart rate 60 bpm or less.  CHA2DS2-VASc at least 5 (HTN, age x1, diabetes, vascular disease, sex category).  She will is on apixaban 5 mg twice daily and does not meet reduced dosing criteria.  Coronary artery calcification/HLD: No symptoms concerning for angina.  She remains on apixaban in place of aspirin to minimize bleeding risk given A-fib.  LDL 73.  She remains on simvastatin and ezetimibe.  HTN with dizziness: Blood pressure is well controlled in the office today.  However, she does note soft blood pressures following her morning medications with elevated blood pressures in the afternoons prior to her p.m. medications.  We will transition Cardura to noon.  Otherwise, she will continue lisinopril 20 mg twice daily and carvedilol 12.5 mg twice daily.  Adequate hydration.  CKD stage IIIb-IV: Stable.  Followed by nephrology.  Avoid nephrotoxic agents.    Disposition: F/u with Dr. Rockey Situ or an APP in 1  month.   Medication Adjustments/Labs and Tests Ordered: Current medicines are reviewed at length with the patient today.  Concerns regarding medicines are outlined above. Medication changes, Labs and Tests ordered today are summarized above and listed in the Patient Instructions accessible in Encounters.   Signed, Christell Faith, PA-C 07/07/2022 10:36 AM     [facility] [address] [place] [address] [place], [place] [address] [phone]

## 2022-07-07 ENCOUNTER — Ambulatory Visit: Payer: Medicare Other | Attending: Physician Assistant | Admitting: Physician Assistant

## 2022-07-07 ENCOUNTER — Encounter: Payer: Self-pay | Admitting: Physician Assistant

## 2022-07-07 ENCOUNTER — Ambulatory Visit (INDEPENDENT_AMBULATORY_CARE_PROVIDER_SITE_OTHER): Payer: Medicare Other

## 2022-07-07 VITALS — BP 120/60 | HR 50 | Ht 60.0 in | Wt 187.5 lb

## 2022-07-07 DIAGNOSIS — N1832 Chronic kidney disease, stage 3b: Secondary | ICD-10-CM | POA: Diagnosis not present

## 2022-07-07 DIAGNOSIS — I4819 Other persistent atrial fibrillation: Secondary | ICD-10-CM | POA: Diagnosis not present

## 2022-07-07 DIAGNOSIS — E785 Hyperlipidemia, unspecified: Secondary | ICD-10-CM | POA: Diagnosis not present

## 2022-07-07 DIAGNOSIS — R42 Dizziness and giddiness: Secondary | ICD-10-CM

## 2022-07-07 DIAGNOSIS — I2584 Coronary atherosclerosis due to calcified coronary lesion: Secondary | ICD-10-CM | POA: Insufficient documentation

## 2022-07-07 DIAGNOSIS — I2510 Atherosclerotic heart disease of native coronary artery without angina pectoris: Secondary | ICD-10-CM | POA: Diagnosis not present

## 2022-07-07 MED ORDER — DOXAZOSIN MESYLATE 2 MG PO TABS
2.0000 mg | ORAL_TABLET | Freq: Every day | ORAL | 3 refills | Status: DC
Start: 2022-07-07 — End: 2022-12-14

## 2022-07-07 MED ORDER — AMIODARONE HCL 200 MG PO TABS: 100.0000 mg | ORAL_TABLET | Freq: Every day | ORAL | 3 refills | Status: DC

## 2022-07-07 NOTE — Patient Instructions (Addendum)
Medication Instructions:  Your physician has recommended you make the following change in your medication:   DECREASE Amiodarone to 100 mg once daily TAKE Cardura at [facility]  *If you need a refill on your cardiac medications before your next appointment, please call your pharmacy*   Lab Work: None  If you have labs (blood work) drawn today and your tests are completely normal, you will receive your results only by: [place] (if you have MyChart) OR A paper copy in the mail If you have any lab test that is abnormal or we need to change your treatment, we will call you to review the results.   Testing/Procedures: Your physician has recommended that you wear a Zio monitor. Wear for 14 days.   This monitor is a medical device that records the heart's electrical activity. Doctors most often use these monitors to diagnose arrhythmias. Arrhythmias are problems with the speed or rhythm of the heartbeat. The monitor is a small device applied to your chest. You can wear one while you do your normal daily activities. While wearing this monitor if you have any symptoms to push the button and record what you felt. Once you have worn this monitor for the period of time provider prescribed (Usually 14 days), you will return the monitor device in the postage paid box. Once it is returned they will download the data collected and provide [place] with a report which the provider will then review and we will call you with those results. Important tips:  Avoid showering during the first 24 hours of wearing the monitor. Avoid excessive sweating to help maximize wear time. Do not submerge the device, no hot tubs, and no swimming pools. Keep any lotions or oils away from the patch. After 24 hours you may shower with the patch on. Take brief showers with your back facing the shower head.  Do not remove patch once it has been placed because that will interrupt data and decrease adhesive wear time. Push the button  when you have any symptoms and write down what you were feeling. Once you have completed wearing your monitor, remove and place into box which has postage paid and place in your outgoing mailbox.  If for some reason you have misplaced your box then call our office and we can provide another box and/or mail it off for you.      Follow-Up: At [facility], you and your health needs are our priority.  As part of our continuing mission to provide you with exceptional heart care, we have created designated Provider Care Teams.  These Care Teams include your primary Cardiologist (physician) and Advanced Practice Providers (APPs -  Physician Assistants and Nurse Practitioners) who all work together to provide you with the care you need, when you need it.   Your next appointment:   1 month(s)  The format for your next appointment:   In Person  Provider:   Ida Rogue, MD or Christell Faith, PA-C        Important Information About Sugar

## 2022-07-09 DIAGNOSIS — I2510 Atherosclerotic heart disease of native coronary artery without angina pectoris: Secondary | ICD-10-CM | POA: Diagnosis not present

## 2022-07-09 DIAGNOSIS — I4819 Other persistent atrial fibrillation: Secondary | ICD-10-CM

## 2022-07-09 DIAGNOSIS — I2584 Coronary atherosclerosis due to calcified coronary lesion: Secondary | ICD-10-CM

## 2022-07-09 DIAGNOSIS — R42 Dizziness and giddiness: Secondary | ICD-10-CM | POA: Diagnosis not present

## 2022-07-12 ENCOUNTER — Encounter: Payer: Self-pay | Admitting: Internal Medicine

## 2022-07-13 ENCOUNTER — Encounter: Payer: Self-pay | Admitting: Internal Medicine

## 2022-07-13 DIAGNOSIS — E1165 Type 2 diabetes mellitus with hyperglycemia: Secondary | ICD-10-CM

## 2022-07-13 MED ORDER — ONETOUCH VERIO W/DEVICE KIT: PACK | 0 refills | Status: AC

## 2022-07-27 ENCOUNTER — Ambulatory Visit (INDEPENDENT_AMBULATORY_CARE_PROVIDER_SITE_OTHER): Payer: Medicare Other | Admitting: Internal Medicine

## 2022-07-27 ENCOUNTER — Encounter: Payer: Self-pay | Admitting: Internal Medicine

## 2022-07-27 VITALS — BP 140/88 | HR 61 | Ht 60.0 in | Wt 189.4 lb

## 2022-07-27 DIAGNOSIS — Z6835 Body mass index (BMI) 35.0-35.9, adult: Secondary | ICD-10-CM | POA: Diagnosis not present

## 2022-07-27 DIAGNOSIS — I2584 Coronary atherosclerosis due to calcified coronary lesion: Secondary | ICD-10-CM

## 2022-07-27 DIAGNOSIS — E1169 Type 2 diabetes mellitus with other specified complication: Secondary | ICD-10-CM

## 2022-07-27 DIAGNOSIS — I2510 Atherosclerotic heart disease of native coronary artery without angina pectoris: Secondary | ICD-10-CM | POA: Diagnosis not present

## 2022-07-27 DIAGNOSIS — Z23 Encounter for immunization: Secondary | ICD-10-CM | POA: Diagnosis not present

## 2022-07-27 DIAGNOSIS — E1165 Type 2 diabetes mellitus with hyperglycemia: Secondary | ICD-10-CM

## 2022-07-27 DIAGNOSIS — E785 Hyperlipidemia, unspecified: Secondary | ICD-10-CM

## 2022-07-27 LAB — POCT GLYCOSYLATED HEMOGLOBIN (HGB A1C): Hemoglobin A1C: 6.3 % — AB (ref 4.0–5.6)

## 2022-07-27 MED ORDER — LANTUS SOLOSTAR 100 UNIT/ML ~~LOC~~ SOPN: 18.0000 [IU] | PEN_INJECTOR | Freq: Every day | SUBCUTANEOUS | 3 refills | Status: DC

## 2022-07-27 NOTE — Progress Notes (Signed)
Patient ID: Brenda [name], female   DOB: 22-Apr-1951, 71 y.o.   MRN: [id number]  HPI: Brenda [name] is a 71 y.o.-year-old female, presenting for follow-up for DM2, dx in 2007, insulin-independent, uncontrolled, with complications (CKD). Last visit 4 months ago.  Interim history: She has increased urination, but no blurry vision, nausea, chest pain. She has joint pains, including pain in the back.  She cannot get any more steroid injections due to her diabetes.   She had PT for the back pain >> feels better >> more active at home. She is still doing the exercises by herself.  She was able to lose several pounds. She has lifelong insomnia.  In the past, she was waking up at 3 AM to go to work at 5:30 AM. She was taken off Metoprolol >> changed to Carvedilol. She was also started on Amiodarone, initially had a high dose, then decreased to 100 mg daily due to intolerance.  Reviewed HbA1c levels: Lab Results  Component Value Date   HGBA1C 6.9 (A) 03/23/2022   HGBA1C 7.0 (A) 11/20/2021   HGBA1C 6.5 (A) 07/17/2021   Patient is on: - Lantus 12 units in a.m.- started 05/2019 by PCP >> 24 units after dinner - Glipizide XR  2.5-5 mg before dinner >> 2.5 mg twice a day >> stopped 05/2022 - Jardiance 10 before breakfast-added 11/2020 She was on Glipizide in 11/2015 after a steroid inj. We stopped Victoza due to nausea. Metformin ER was stopped by PCP due to CKD. Glipizide ER was stopped 06/2019 due to low blood sugars.  Pt checks her sugars twice a day: - am: 80-134, 145, 150 >> 74-136 (140-160) >> 85, 94-140, 160 >> 70-100, 113 - 2h after b'fast: 156 >> n/c >> 148 >> n/c >> 134 - before lunch: 58, 64-113, 126 >> 63, 81-140 >> 71-142, 160 >> 63, 68-114 - 2h after lunch: 118, 123 >> 92, 99 >> n/c >> 145 >> n/c - before dinner: 82-148 >> 90-164 >> 80, 99-157, 160, 170 >> 73-135, 150 - 2h after dinner:n/c >> 138, 148 >> n/c >> 133 - bedtime:  149-164 >> 130-140 >> 137, 142 >> n/c >> 93-115 -  nighttime: n/c Lowest sugar was 37 x1 >> ... 58 >> 63 x1 >> 60 >> 63; she has hypoglycemia awareness in the 70s. Highest sugar was 200 >> 160 >> 164 >> 188 >> 180 (wedding)  Glucometer: Freestyle Lite  Pt's meals are: - Breakfast: English muffin + preserve or cereal or yoghurt - snack: fruit or fruit + yoghurt - Lunch: 1/2 sandwich + chips + salsa + fruit/sugar free >> protein bar - Dinner: soup or chicken/steak + veggies, occasional starch or Lean cuisine or light salad No soft drinks.  -+ CKD, last BUN/creatinine:  Lab Results  Component Value Date   BUN 35 (H) 06/16/2022   CREATININE 2.02 (H) 06/16/2022  On benazepril.  Sees nephrology. She had a high potassium (5.9) >> used Kayexalate x3 >> normalized.  She is on a low potassium diet.  -+ HL last set of lipids: Lab Results  Component Value Date   CHOL 124 07/20/2021   HDL 29 (L) 07/20/2021   LDLCALC 73 07/20/2021   LDLDIRECT 55.0 05/15/2020   TRIG 122 07/20/2021   CHOLHDL 4.3 07/20/2021  On Zocor and Zetia.  - last eye exam 03/30/2022: No DR; she had cataract surgery in 2016.  She has a history of retinal tear.  - no numbness and tingling in her feet.  She takes Neurontin for leg cramps at night.  Last foot exam 03/2022.  She also has atrial fibrillation, HTN, GERD, depression. She was admitted 11/2017 for dehydration, anemia, and acute renal failure >> diagnosed with CLL.  ROS: + see HPI  I reviewed pt's medications, allergies, PMH, social hx, family hx, and changes were documented in the history of present illness. Otherwise, unchanged from my initial visit note.  Past Medical History:  Diagnosis Date   Anxiety    Arthritis    Atrial fibrillation (HCC)    CLL (chronic lymphocytic leukemia) ([place])    Dx. 2019 Dr. 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 Particia Nearing first visit 11-2019   Past Surgical History:  Procedure Laterality Date   ABDOMINAL HYSTERECTOMY  1998   COLONOSCOPY     EYE SURGERY Bilateral    cataracts   EYE SURGERY Left    retina tear   FEMUR IM NAIL  10/25/2012   Procedure: INTRAMEDULLARY (IM) NAIL FEMORAL;  Surgeon: Mauri Pole, MD;  Location: [facility];  Service: Orthopedics;  Laterality: Left;   HIP SURGERY     Fracture L IM nail and 3 rods   left knee arthroscopy  12/2010   LYMPH NODE BIOPSY     axillary left 12-2018   REFRACTIVE SURGERY     skin cancer removed right and and lower forearm     squamoun in situ   TOTAL KNEE ARTHROPLASTY  12/20/2011   Procedure: TOTAL KNEE ARTHROPLASTY;  Surgeon: Gearlean Alf, MD;  Location: [facility];  Service: Orthopedics;  Laterality: Left;  Failed attempt at spinal    TOTAL SHOULDER ARTHROPLASTY Right 08/19/2016   Procedure: RIGHT TOTAL SHOULDER ARTHROPLASTY;  Surgeon: Justice Britain, MD;  Location: [place];  Service: Orthopedics;  Laterality: Right;   TOTAL SHOULDER ARTHROPLASTY Left 01/10/2020   Procedure: TOTAL SHOULDER ARTHROPLASTY;  Surgeon: Justice Britain, MD;  Location: [facility];  Service: Orthopedics;  Laterality: Left;  136mn   Social History   Social History   Marital Status: Single    Spouse Name: N/A   Number of Children: 0   Occupational History   RN at [employer]    Social  History Main Topics   Smoking status: Never Smoker    Smokeless tobacco: Never Used   Alcohol Use: No   Drug Use: No   Social History [facility]ult [facility]   Current Outpatient Medications on File Prior to Visit  Medication Sig Dispense Refill   acetaminophen (TYLENOL)  500 MG tablet Take 500-1,000 mg by mouth every 6 (six) hours as needed for mild pain, moderate pain or fever.      ALPRAZolam (XANAX) 1 MG tablet TAKE 1/2 TO 1 TABLET(0.5 TO 1 MG) BY MOUTH TWICE DAILY AS NEEDED FOR ANXIETY 60 tablet 5   amiodarone (PACERONE) 200 MG tablet Take 0.5 tablets (100 mg total) by mouth daily. 90 tablet 3   BD PEN NEEDLE NANO 2ND GEN 32G X 4 MM MISC USE DAILY AS DIRECTED 100 each 3   Blood Glucose Monitoring Suppl (ONETOUCH VERIO) w/Device KIT Use as advised 1 kit 0   buPROPion (WELLBUTRIN XL) 300 MG 24 hr tablet Take 1 tablet (300 mg total) by mouth daily. 90 tablet 3   carvedilol (COREG) 12.5 MG tablet Take 1 tablet (12.5 mg total) by mouth 2 (two) times daily with a meal. 180 tablet 3   Cholecalciferol (VITAMIN D3) 2000 units TABS Take 2,000 mcg by mouth daily.     Cyanocobalamin (B-12) 1000 MCG SUBL Place 1,000 mcg under the tongue every evening.      doxazosin (CARDURA) 2 MG tablet Take 1 tablet (2 mg total) by mouth daily after lunch. 90 tablet 3   ELIQUIS 5 MG TABS tablet TAKE 1 TABLET(5 MG) BY MOUTH TWICE DAILY 60 tablet 5   esomeprazole (NEXIUM) 20 MG capsule Take 20 mg by mouth daily as needed (acid reflux symptoms). M-W-F     ezetimibe (ZETIA) 10 MG tablet Take 1 tablet (10 mg total) by mouth daily. 90 tablet 3   glipiZIDE (GLUCOTROL XL) 2.5 MG 24 hr tablet Take 1 tablet (2.5 mg total) by mouth 2 (two) times daily at 8 am and 10 pm. (Patient not taking: Reported on 07/07/2022) 180 tablet 1   glucose blood (ONETOUCH VERIO) test strip TEST BLOOD SUGAR THREE TIMES DAILY AS DIRECTED 300 strip 1   insulin glargine (LANTUS SOLOSTAR) 100 UNIT/ML Solostar Pen Inject 24 Units into the skin  daily. 30 mL 3   iron polysaccharides (NIFEREX) 150 MG capsule Take 150 mg by mouth at bedtime.      JARDIANCE 10 MG TABS tablet TAKE 1 TABLET(10 MG) BY MOUTH DAILY BEFORE BREAKFAST 30 tablet 11   Lancet Devices (LANCING DEVICE) MISC Use as advised - Freestyle 1 each 0   Lancets (ONETOUCH DELICA PLUS [id number]) MISC CHECK BLOOD SUGAR THREE TIMES DAILY AS DIRECTED 300 each 1   lisinopril (ZESTRIL) 20 MG tablet Take 1 tablet (20 mg total) by mouth 2 (two) times daily. 180 tablet 3   NONFORMULARY OR COMPOUNDED ITEM Apply 1 application topically 2 (two) times daily as needed (sun damage on face). FLUOROURACIL 5% + CALCIPOTRIENE 0.005%     simvastatin (ZOCOR) 40 MG tablet TAKE 1 TABLET(40 MG) BY MOUTH AT BEDTIME 90 tablet 3   triamcinolone cream (KENALOG) 0.1 % Apply 1 application topically 2 (two) times daily as needed (skin rash.).      No current facility-administered medications on file prior to visit.   Allergies  Allergen Reactions   Gadolinium Derivatives Other (See Comments)   Nsaids Other (See Comments)    Due to kidney function   Amlodipine     Swelling     Contrast Media [Iodinated Contrast Media] Other (See Comments)    Patient states unable to take / use due to kidney function   Family History  Problem Relation Age of Onset   Cancer Mother        breast cancer   Cancer Father  plasma sarcoma   Breast cancer Neg Hx    PE: BP (!) 140/88 (BP Location: Left Arm, Patient Position: Sitting, Cuff Size: Normal)   Pulse 61   Ht 5' (1.524 m)   Wt 189 lb 6.4 oz (85.9 kg)   SpO2 95%   BMI 36.99 kg/m   Wt Readings from Last 3 Encounters:  07/27/22 189 lb 6.4 oz (85.9 kg)  07/07/22 187 lb 8 oz (85 kg)  06/16/22 197 lb 9.6 oz (89.6 kg)   Constitutional: overweight, in NAD Eyes: EOMI, no exophthalmos ENT: no thyromegaly, no cervical lymphadenopathy Cardiovascular: No MRG, + LLE edema-chronic, L>R Respiratory: CTA B Musculoskeletal: no deformities Skin: no  rashes Neurological: no tremor with outstretched hands  Assessment: 1. DM2, insulin-dependent, uncontrolled, with complications  - mild CKD  2. Obesity class 2  3. HL  PLAN:  1. Patient with longstanding, uncontrolled, type 2 diabetes, with much improved control after addition of insulin in the summer 2020.  At that time, metformin was stopped due to worsening kidney function.  She was on Victoza but she could not tolerate it due to nausea.  She was reticent to try another GLP-1 receptor agonist afterwards.   -At last visit, sugars are mostly at goal with mild hyperglycemic exceptions especially in the morning and before dinner.  We split her dinnertime glipizide into 2 doses: Before lunch and before dinner.  We did discuss about possibly starting Ozempic but she was in the donut hole and could barely afford her Jardiance and Eliquis, so we could not start.  Her HbA1c at that time was lower, at 6.9%. -At this visit, sugars are mostly lower than 100, down to the 70s.  She already stopped his diet and at this visit, to avoid the risk of hypoglycemia, I advised her to also decrease the Lantus.  I would like to keep her on the lowest dose of insulin possible.  For now, we can continue Jardiance at the current dose.  She tolerates this well. - I suggested to:  Patient Instructions  Please continue: - Jardiance 10 mg before b'fast   Decrease: - Lantus 18-20 units after dinner  Please return in 4-6 months with your sugar log.  - we checked her HbA1c: 6.3% (lower) - advised to check sugars at different times of the day - 1x a day, rotating check times - advised for yearly eye exams >> she is UTD - return to clinic in 4-6 months   2. Obesity class 2 -She has problems eating the right foods, since she also has to limit potassium -We will continue Jardiance 10 mg daily, which should also help with weight loss -Before last visit, he gained 5 pounds, previously lost 8 -At last visit we discussed  that she continued to gain weight and unfortunately this was putting more strain on her back.  She cannot exercise due to the pain and was thinking about physical therapy.  I strongly advised her to do so.  She mentioned that she was getting severe back pain with standing for 2 to 3 hours.  I advised her to try to exercise for shorter periods of time and also to do some chair exercises -She was reticent to retry GLP-1 receptor agonist, due to previous side effects and also cost -Since last visit, she lost 7 pounds -after stopping metoprolol and starting exercise  3.  Hyperlipidemia -Reviewed latest lipid panel from a year ago: LDL above our target of less than 70, HDL low: Lab Results  Component Value Date   CHOL 124 07/20/2021   HDL 29 (L) 07/20/2021   LDLCALC 73 07/20/2021   LDLDIRECT 55.0 05/15/2020   TRIG 122 07/20/2021   CHOLHDL 4.3 07/20/2021  -She continues on Zocor 40 mg daily and Zetia 10 mg daily without side effects -she would like to wait until her appt with cardiology to recheck this (in 08/2022)  Philemon Kingdom, MD PhD [facility] Endocrinology

## 2022-07-27 NOTE — Patient Instructions (Addendum)
Please continue: - Jardiance 10 mg before b'fast   Decrease: - Lantus 18-20 units after dinner  Please return in 4-6 months with your sugar log.

## 2022-07-30 ENCOUNTER — Telehealth: Payer: Self-pay | Admitting: Cardiovascular Disease

## 2022-07-30 ENCOUNTER — Inpatient Hospital Stay (HOSPITAL_COMMUNITY)
Admission: EM | Admit: 2022-07-30 | Discharge: 2022-08-01 | DRG: 309 | Disposition: A | Discharge status: Home / Self Care | Payer: Medicare Other | Attending: Cardiology | Admitting: Cardiology

## 2022-07-30 ENCOUNTER — Other Ambulatory Visit: Payer: Self-pay

## 2022-07-30 DIAGNOSIS — Z79899 Other long term (current) drug therapy: Secondary | ICD-10-CM | POA: Diagnosis not present

## 2022-07-30 DIAGNOSIS — Z7901 Long term (current) use of anticoagulants: Secondary | ICD-10-CM

## 2022-07-30 DIAGNOSIS — Z91041 Radiographic dye allergy status: Secondary | ICD-10-CM

## 2022-07-30 DIAGNOSIS — Z96652 Presence of left artificial knee joint: Secondary | ICD-10-CM | POA: Diagnosis present

## 2022-07-30 DIAGNOSIS — I129 Hypertensive chronic kidney disease with stage 1 through stage 4 chronic kidney disease, or unspecified chronic kidney disease: Secondary | ICD-10-CM | POA: Diagnosis not present

## 2022-07-30 DIAGNOSIS — E1122 Type 2 diabetes mellitus with diabetic chronic kidney disease: Secondary | ICD-10-CM | POA: Diagnosis present

## 2022-07-30 DIAGNOSIS — F32A Depression, unspecified: Secondary | ICD-10-CM | POA: Diagnosis not present

## 2022-07-30 DIAGNOSIS — Z96611 Presence of right artificial shoulder joint: Secondary | ICD-10-CM | POA: Diagnosis present

## 2022-07-30 DIAGNOSIS — F419 Anxiety disorder, unspecified: Secondary | ICD-10-CM | POA: Diagnosis present

## 2022-07-30 DIAGNOSIS — Z86008 Personal history of in-situ neoplasm of other site: Secondary | ICD-10-CM | POA: Diagnosis not present

## 2022-07-30 DIAGNOSIS — Z85828 Personal history of other malignant neoplasm of skin: Secondary | ICD-10-CM

## 2022-07-30 DIAGNOSIS — C9110 Chronic lymphocytic leukemia of B-cell type not having achieved remission: Secondary | ICD-10-CM | POA: Diagnosis present

## 2022-07-30 DIAGNOSIS — Z90710 Acquired absence of both cervix and uterus: Secondary | ICD-10-CM

## 2022-07-30 DIAGNOSIS — Z886 Allergy status to analgesic agent status: Secondary | ICD-10-CM | POA: Diagnosis not present

## 2022-07-30 DIAGNOSIS — E118 Type 2 diabetes mellitus with unspecified complications: Secondary | ICD-10-CM | POA: Diagnosis present

## 2022-07-30 DIAGNOSIS — E785 Hyperlipidemia, unspecified: Secondary | ICD-10-CM | POA: Diagnosis present

## 2022-07-30 DIAGNOSIS — R42 Dizziness and giddiness: Secondary | ICD-10-CM | POA: Diagnosis not present

## 2022-07-30 DIAGNOSIS — Z8542 Personal history of malignant neoplasm of other parts of uterus: Secondary | ICD-10-CM | POA: Diagnosis not present

## 2022-07-30 DIAGNOSIS — I10 Essential (primary) hypertension: Secondary | ICD-10-CM | POA: Diagnosis present

## 2022-07-30 DIAGNOSIS — Z888 Allergy status to other drugs, medicaments and biological substances status: Secondary | ICD-10-CM

## 2022-07-30 DIAGNOSIS — I495 Sick sinus syndrome: Secondary | ICD-10-CM | POA: Diagnosis present

## 2022-07-30 DIAGNOSIS — N183 Chronic kidney disease, stage 3 unspecified: Secondary | ICD-10-CM | POA: Diagnosis present

## 2022-07-30 DIAGNOSIS — N1832 Chronic kidney disease, stage 3b: Secondary | ICD-10-CM | POA: Diagnosis not present

## 2022-07-30 DIAGNOSIS — I4819 Other persistent atrial fibrillation: Secondary | ICD-10-CM | POA: Diagnosis present

## 2022-07-30 DIAGNOSIS — I455 Other specified heart block: Principal | ICD-10-CM

## 2022-07-30 DIAGNOSIS — M7989 Other specified soft tissue disorders: Secondary | ICD-10-CM | POA: Diagnosis present

## 2022-07-30 DIAGNOSIS — Z794 Long term (current) use of insulin: Secondary | ICD-10-CM

## 2022-07-30 DIAGNOSIS — I2510 Atherosclerotic heart disease of native coronary artery without angina pectoris: Secondary | ICD-10-CM | POA: Diagnosis present

## 2022-07-30 DIAGNOSIS — K219 Gastro-esophageal reflux disease without esophagitis: Secondary | ICD-10-CM | POA: Diagnosis present

## 2022-07-30 DIAGNOSIS — Z96612 Presence of left artificial shoulder joint: Secondary | ICD-10-CM | POA: Diagnosis not present

## 2022-07-30 DIAGNOSIS — I4891 Unspecified atrial fibrillation: Secondary | ICD-10-CM | POA: Diagnosis present

## 2022-07-30 DIAGNOSIS — Z6836 Body mass index (BMI) 36.0-36.9, adult: Secondary | ICD-10-CM

## 2022-07-30 LAB — BASIC METABOLIC PANEL
Anion gap: 9 (ref 5–15)
BUN: 41 mg/dL — ABNORMAL HIGH (ref 8–23)
CO2: 19 mmol/L — ABNORMAL LOW (ref 22–32)
Calcium: 9 mg/dL (ref 8.9–10.3)
Chloride: 111 mmol/L (ref 98–111)
Creatinine, Ser: 1.88 mg/dL — ABNORMAL HIGH (ref 0.44–1.00)
GFR, Estimated: 28 mL/min — ABNORMAL LOW (ref >60)
Glucose, Bld: 143 mg/dL — ABNORMAL HIGH (ref 70–99)
Potassium: 4.6 mmol/L (ref 3.5–5.1)
Sodium: 139 mmol/L (ref 135–145)

## 2022-07-30 LAB — CBC
HCT: 38.6 % (ref 36.0–46.0)
Hemoglobin: 12.3 g/dL (ref 12.0–15.0)
MCH: 29.9 pg (ref 26.0–34.0)
MCHC: 31.9 g/dL (ref 30.0–36.0)
MCV: 93.7 fL (ref 80.0–100.0)
Platelets: 115 10*3/uL — ABNORMAL LOW (ref 150–400)
RBC: 4.12 MIL/uL (ref 3.87–5.11)
RDW: 13.7 % (ref 11.5–15.5)
WBC: 11.6 10*3/uL — ABNORMAL HIGH (ref 4.0–10.5)
nRBC: 0 % (ref 0.0–0.2)

## 2022-07-30 LAB — MAGNESIUM: Magnesium: 1.9 mg/dL (ref 1.7–2.4)

## 2022-07-30 LAB — TSH: TSH: 11.721 u[IU]/mL — ABNORMAL HIGH (ref 0.350–4.500)

## 2022-07-30 LAB — CBG MONITORING, ED: Glucose-Capillary: 83 mg/dL (ref 70–99)

## 2022-07-30 MED ORDER — ONDANSETRON HCL 4 MG PO TABS: 4.0000 mg | ORAL_TABLET | Freq: Four times a day (QID) | ORAL | Status: DC | PRN

## 2022-07-30 MED ORDER — ACETAMINOPHEN 325 MG PO TABS: 650.0000 mg | ORAL_TABLET | Freq: Four times a day (QID) | ORAL | Status: DC | PRN

## 2022-07-30 MED ORDER — HEPARIN (PORCINE) 25000 UT/250ML-% IV SOLN
1000.0000 [IU]/h | INTRAVENOUS | Status: DC
  Administered 2022-07-30: 1000 [IU]/h via INTRAVENOUS
  Filled 2022-07-30: qty 250

## 2022-07-30 MED ORDER — ACETAMINOPHEN 650 MG RE SUPP: 650.0000 mg | Freq: Four times a day (QID) | RECTAL | Status: DC | PRN

## 2022-07-30 MED ORDER — ONDANSETRON HCL 4 MG/2ML IJ SOLN: 4.0000 mg | Freq: Four times a day (QID) | INTRAMUSCULAR | Status: DC | PRN

## 2022-07-30 MED ORDER — INSULIN ASPART 100 UNIT/ML IJ SOLN: 0.0000 [IU] | Freq: Three times a day (TID) | INTRAMUSCULAR | Status: DC

## 2022-07-30 NOTE — Assessment & Plan Note (Addendum)
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 Unlikely to be able to get rate control prior to PPM placement. Hold eliquis and use heparin gtt instead pending PPM surgery.

## 2022-07-30 NOTE — ED Provider Notes (Signed)
[facility] EMERGENCY DEPARTMENT Provider Note   CSN: [id number] Arrival date & time: 07/30/22  1800     History  Chief Complaint  Patient presents with   Atrial Fibrillation    Brenda [name] is a 71 y.o. female with CLL, history endometrial cancer, SCC of right hand, CKD stage III, T2DM, CAD, HLD, GERD, OA, morbid obesity, presents with arrhythmia.   Patient was sent in by her cardiologist.  She has been wearing a cardiac monitor for the past 2 weeks, which ended one week ago.  She was called and told to come urgently to the emergency department because she is in a chronic low grade atrial fibrillation with episodes of sinus pauses from 3-8 seconds. Denies any CP, SOB, palpitations. Does report one instance in early September when she noticed her HR was in the 40s and had dizziness associated with that episode but hasn't had that since then. She has been having adjustments with her blood pressure medications all summer which she states is why she was having the monitor on.  Per cards telephone note from today: Zio patch reviewed, showing 66 pauses occurring with the longest lasting 7.9 seconds at 6:53 AM.  Multiple pauses were associated with a patient triggered event including a 3.1-second pause at 6:46 AM, and multiple 3 to 6.1-second pause at 8:54 AM.  Monitor also notable for a 27% A-fib burden with ventricular rates ranging from 24 bpm to 129 bpm.  Patient currently asymptomatic on the phone this afternoon.  She has been advised to hold carvedilol and amiodarone.  She has also been advised to have a family/friend/EMS transport her to the [facility] emergency department for further evaluation, up to and including EP evaluation for consideration of pacemaker implantation prior to discharge.   Atrial Fibrillation       Home Medications Prior to Admission medications   Medication Sig Start Date End Date Taking? Authorizing Provider  acetaminophen (TYLENOL) 500  MG tablet Take 500-1,000 mg by mouth every 6 (six) hours as needed for mild pain, moderate pain or fever.     [provider]  ALPRAZolam (XANAX) 1 MG tablet TAKE 1/2 TO 1 TABLET(0.5 TO 1 MG) BY MOUTH TWICE DAILY AS NEEDED FOR ANXIETY 04/16/22   [name], MD  amiodarone (PACERONE) 200 MG tablet Take 0.5 tablets (100 mg total) by mouth daily. 07/07/22   Dunn, Areta Haber, PA-C  BD PEN NEEDLE NANO 2ND GEN 32G X 4 MM MISC USE DAILY AS DIRECTED 08/05/21   Philemon Kingdom, MD  Blood Glucose Monitoring Suppl [facility] VERIO) w/Device KIT Use as advised 07/13/22   Philemon Kingdom, MD  buPROPion (WELLBUTRIN XL) 300 MG 24 hr tablet Take 1 tablet (300 mg total) by mouth daily. 10/16/21   [name], MD  carvedilol (COREG) 12.5 MG tablet Take 1 tablet (12.5 mg total) by mouth 2 (two) times daily with a meal. 05/14/22 08/12/22  Dunn, Areta Haber, PA-C  Cholecalciferol (VITAMIN D3) 2000 units TABS Take 2,000 mcg by mouth daily.    [provider]  Cyanocobalamin (B-12) 1000 MCG SUBL Place 1,000 mcg under the tongue every evening.  12/15/17   [provider]  doxazosin (CARDURA) 2 MG tablet Take 1 tablet (2 mg total) by mouth daily after lunch. 07/07/22   Dunn, Ryan M, PA-C  ELIQUIS 5 MG TABS tablet TAKE 1 TABLET(5 MG) BY MOUTH TWICE DAILY 03/02/22   Minna Merritts, MD  esomeprazole (NEXIUM) 20 MG capsule Take  20 mg by mouth daily as needed (acid reflux symptoms). M-W-F    [provider]  ezetimibe (ZETIA) 10 MG tablet Take 1 tablet (10 mg total) by mouth daily. 10/28/21   Minna Merritts, MD  glucose blood (ONETOUCH VERIO) test strip TEST BLOOD SUGAR THREE TIMES DAILY AS DIRECTED 06/21/22   Philemon Kingdom, MD  insulin glargine (LANTUS SOLOSTAR) 100 UNIT/ML Solostar Pen Inject 18-20 Units into the skin daily. 07/27/22   Philemon Kingdom, MD  iron polysaccharides (NIFEREX) 150 MG capsule Take 150 mg by mouth at bedtime.     [provider]  JARDIANCE 10 MG  TABS tablet TAKE 1 TABLET(10 MG) BY MOUTH DAILY BEFORE BREAKFAST 11/06/21   Philemon Kingdom, MD  Lancet Devices (LANCING DEVICE) MISC Use as advised - Freestyle 05/29/19   Philemon Kingdom, MD  Lancets [facility] DELICA PLUS [id number]) [place] CHECK BLOOD SUGAR THREE TIMES DAILY AS DIRECTED 12/21/21   Philemon Kingdom, MD  lisinopril (ZESTRIL) 20 MG tablet Take 1 tablet (20 mg total) by mouth 2 (two) times daily. 06/03/22 09/01/22  Rise Mu, PA-C  NONFORMULARY OR COMPOUNDED ITEM Apply 1 application topically 2 (two) times daily as needed (sun damage on face). FLUOROURACIL 5% + CALCIPOTRIENE 0.005% 08/15/19   [provider]  simvastatin (ZOCOR) 40 MG tablet TAKE 1 TABLET(40 MG) BY MOUTH AT BEDTIME 01/18/22   Minna Merritts, MD  triamcinolone cream (KENALOG) 0.1 % Apply 1 application topically 2 (two) times daily as needed (skin rash.).  08/14/19   [provider]      Allergies    Gadolinium derivatives, Nsaids, Amlodipine, and Contrast media [iodinated contrast media]    Review of Systems   Review of Systems Review of systems negative for CP.  A 10 point review of systems was performed and is negative unless otherwise reported in HPI.  Physical Exam Updated Vital Signs BP (!) 160/62   Pulse 70   Temp 98.2 F (36.8 C) (Oral)   Resp 14   SpO2 97%  Physical Exam General: Normal appearing female, lying in bed.  HEENT: PERRLA, Sclera anicteric, MMM, trachea midline. Cardiology: RRR, no murmurs/rubs/gallops. BL radial and DP pulses equal bilaterally.  Resp: Normal respiratory rate and effort. CTAB, no wheezes, rhonchi, crackles.  Abd: Soft, non-tender, non-distended. No rebound tenderness or guarding.  GU: Deferred. MSK: No peripheral edema or signs of trauma. No cyanosis or clubbing. Skin: warm, dry. No rashes or lesions. Neuro: A&Ox4, CNs II-XII grossly intact. MAEs. Sensation grossly intact.  Psych: Normal mood and affect.   ED Results / Procedures / Treatments    Labs (all labs ordered are listed, but only abnormal results are displayed) Labs Reviewed  BASIC METABOLIC PANEL  MAGNESIUM  CBC  TSH    EKG EKG Interpretation  Date/Time:  Friday July 30 2022 18:15:22 EDT Ventricular Rate:  81 PR Interval:  214 QRS Duration: 96 QT Interval:  402 QTC Calculation: 466 R Axis:   -48 Text Interpretation: Sinus rhythm with 1st degree A-V block Left axis deviation Low voltage QRS Abnormal ECG When compared with ECG of 22-Jun-2020 08:31, PREVIOUS ECG IS PRESENT Confirmed by Cindee Lame [phone]) on 07/30/2022 7:18:40 PM  Radiology No results found.  Procedures Procedures    Medications Ordered in ED Medications - No data to display  ED Course/ Medical Decision Making/ A&P                          Medical Decision  Making Risk Decision regarding hospitalization.    Patient well-appearing with no acute complaints. Zio patch demonstrated sinus pauses. Consider SSS, slow afib, tachy-brady syndrome. Patient with no CP/SOB/nausea to indicate ACS/acute ischemia. She has not had any episodes of syncope but did have one episode of dizziness a/w bradycardia noted while she was taking her BP meds. EKG today demonstrates normal sinus rhythm w/ first degree block. Consider electrolyte abnormalities or thyroid abnormalities. Patient is consulted to cardiology, who states that patient will likely need a pacemaker. Consulted to hospitalist for admission. Labs pending at time of admission.  Dispo: Admit in stable condition           Final Clinical Impression(s) / ED Diagnoses Final diagnoses:  Sinus pause    Rx / DC Orders ED Discharge Orders     None        This note was created using dictation software, which may contain spelling or grammatical errors.    Audley Hose, MD 07/30/22 2004

## 2022-07-30 NOTE — ED Triage Notes (Signed)
Pt sent here by cardiologist after heart monitor readings showed atrial fibrillations w/ up to 8 second pauses. Pt denies cp/sob, reports intermittent "fluttering" in her chest. Pt has no hx of a-fib.

## 2022-07-30 NOTE — Assessment & Plan Note (Signed)
1. Needs EP eval per cards and likely PPM. 1. See cards note 2. Tele monitor 3. Hold coreg and amiodarone 4. Will let patient eat for the moment since PPM unlikely to be done non-emergently over weekend.

## 2022-07-30 NOTE — Assessment & Plan Note (Signed)
Hold coreg Continue cardura, lisinopril

## 2022-07-30 NOTE — H&P (Addendum)
Cardiology Admission History and Physical   Patient ID: ANNALEIGHA WOO MRN: [id number]; DOB: 20-Aug-1951   Admission date: 07/30/2022  PCP:  [name], MD   [facility] Cardiologist:  Ida Rogue, MD   Chief Complaint: Tachybradycardia syndrome  Patient Profile:   Brenda [name] is a 71 y.o. female with a history of CAD by CT imaging, persistent atrial fibrillation, DM 2, HLD, CLL, CKD stage IIIb-4, morbid obesity, and anemia who is being seen 07/30/2022 for the evaluation of tachybradycardia syndrome.  History of Present Illness:   Brenda [name] was found to have an elevated calcium score 12/2015 and multiple pulmonary nodules bilaterally.  Follow-up CT imaging for nodules led to a PET scan that was consistent with CLL confirmed by biopsy.  Brenda [name] was diagnosed with new onset atrial fibrillation at an office visit 03/2021 was started on Eliquis.  Brenda [name] was subsequently in sinus rhythm and follow-up.  Echocardiogram showed an LVEF of 60-65%, no RWMA, normal LV diastolic function, normal RV, and mild MR.  Unfortunately Brenda [name] was in A-fib RVR on 04/19/2022 despite escalation of metoprolol.  Brenda [name] was started on amiodarone.  Unfortunately, following initiation of amiodarone therapy Brenda [name] was noted to have heart rates in the upper 40s.  Brenda [name] was intermittently holding metoprolol for low heart rates which caused hypotension.  Due to ongoing dizziness, amiodarone was reduced to 100 mg daily and a heart monitor was placed.  Brenda [name] was continued on carvedilol 12.5 mg twice daily with hold parameters.  Eliquis was continued.  Her Zio patch was reviewed today and showed 66 pauses with the longest pause lasting approximately 8 seconds.  Multiple pauses were associated with patient triggered events.  Monitor also noted 27% A-fib burden with ventricular rates ranging from 24 to 129 bpm.  Given these results, Brenda [name] was called at home and instructed to hold carvedilol and amiodarone and  proceed to [facility].   During my interview Brenda [name] confirmed dizziness but no syncope. Brenda [name] denies chest pain. Her insulin was just reduced to 18-20 U lantus at bedtime. Brenda [name] is largely unaware of her Afib RVR with only occasional fluttering.   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   Last dose of coreg and eliquis were 07/30/22 at 0700.   Past Medical History:  Diagnosis Date   Anxiety    Arthritis    Atrial fibrillation (HCC)    CLL (chronic lymphocytic leukemia) ([place])    Dx. 2019 Dr. 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 Particia Nearing first visit 11-2019    Past Surgical History:  Procedure Laterality Date   ABDOMINAL HYSTERECTOMY  1998   COLONOSCOPY     EYE SURGERY Bilateral    cataracts   EYE SURGERY Left    retina tear   FEMUR IM NAIL  10/25/2012   Procedure: INTRAMEDULLARY (IM) NAIL FEMORAL;  Surgeon: Mauri Pole, MD;  Location: [facility];  Service: Orthopedics;  Laterality: Left;   HIP SURGERY     Fracture L IM nail and 3 rods   left knee arthroscopy  12/2010   LYMPH NODE BIOPSY     axillary left 12-2018   REFRACTIVE SURGERY     skin cancer removed right and and lower forearm     squamoun in  situ   TOTAL KNEE ARTHROPLASTY  12/20/2011   Procedure: TOTAL KNEE ARTHROPLASTY;  Surgeon: Gearlean Alf, MD;  Location: [facility];  Service: Orthopedics;  Laterality: Left;  Failed attempt at spinal    TOTAL SHOULDER ARTHROPLASTY Right 08/19/2016   Procedure: RIGHT TOTAL SHOULDER ARTHROPLASTY;  Surgeon: Justice Britain, MD;  Location: [place];  Service: Orthopedics;  Laterality: Right;   TOTAL SHOULDER ARTHROPLASTY Left 01/10/2020   Procedure: TOTAL SHOULDER ARTHROPLASTY;  Surgeon: Justice Britain, MD;  Location: [facility];  Service: Orthopedics;  Laterality: Left;  170mn     Medications Prior to Admission: Prior to Admission medications   Medication Sig Start Date End Date Taking? Authorizing Provider  acetaminophen (TYLENOL) 500 MG tablet Take 500-1,000 mg by mouth every 6 (six) hours as needed for mild pain, moderate pain or fever.     [provider]  ALPRAZolam (XANAX) 1 MG tablet TAKE 1/2 TO 1 TABLET(0.5 TO 1 MG) BY MOUTH TWICE DAILY AS NEEDED FOR ANXIETY 04/16/22   CHoyt Koch MD  amiodarone (PACERONE) 200 MG tablet Take 0.5 tablets (100 mg total) by mouth daily. 07/07/22   Dunn, RAreta Haber PA-C  BD PEN NEEDLE NANO 2ND GEN 32G X 4 MM MISC USE DAILY AS DIRECTED 08/05/21   GPhilemon Kingdom MD  Blood Glucose Monitoring Suppl ([facility]) w/Device KIT Use as advised 07/13/22   GPhilemon Kingdom MD  buPROPion (WELLBUTRIN XL) 300 MG 24 hr tablet Take 1 tablet (300 mg total) by mouth daily. 10/16/21   CHoyt Koch MD  carvedilol (COREG) 12.5 MG tablet Take 1 tablet (12.5 mg total) by mouth 2 (two) times daily with a meal. 05/14/22 08/12/22  Dunn, RAreta Haber PA-C  Cholecalciferol (VITAMIN D3) 2000 units TABS Take 2,000 mcg by mouth daily.    [provider]  Cyanocobalamin (B-12) 1000 MCG SUBL Place 1,000 mcg under the tongue every evening.  12/15/17   [provider]  doxazosin (CARDURA) 2 MG tablet Take 1 tablet (2 mg total) by mouth daily after lunch. 07/07/22    Dunn, Ryan M, PA-C  ELIQUIS 5 MG TABS tablet TAKE 1 TABLET(5 MG) BY MOUTH TWICE DAILY 03/02/22   GMinna Merritts MD  esomeprazole (NEXIUM) 20 MG capsule Take 20 mg by mouth daily as needed (acid reflux symptoms). M-W-F    [provider]  ezetimibe (ZETIA) 10 MG tablet Take 1 tablet (10 mg total) by mouth daily. 10/28/21   GMinna Merritts MD  glucose blood (ONETOUCH VERIO) test strip TEST BLOOD SUGAR THREE TIMES DAILY AS DIRECTED 06/21/22   GPhilemon Kingdom MD  insulin glargine (LANTUS SOLOSTAR) 100 UNIT/ML Solostar Pen Inject 18-20 Units into the skin daily. 07/27/22  Philemon Kingdom, MD  iron polysaccharides (NIFEREX) 150 MG capsule Take 150 mg by mouth at bedtime.     [provider]  JARDIANCE 10 MG TABS tablet TAKE 1 TABLET(10 MG) BY MOUTH DAILY BEFORE BREAKFAST 11/06/21   Philemon Kingdom, MD  Lancet Devices (LANCING DEVICE) MISC Use as advised - Freestyle 05/29/19   Philemon Kingdom, MD  Lancets [facility] DELICA PLUS [id number]) [place] CHECK BLOOD SUGAR THREE TIMES DAILY AS DIRECTED 12/21/21   Philemon Kingdom, MD  lisinopril (ZESTRIL) 20 MG tablet Take 1 tablet (20 mg total) by mouth 2 (two) times daily. 06/03/22 09/01/22  Rise Mu, PA-C  NONFORMULARY OR COMPOUNDED ITEM Apply 1 application topically 2 (two) times daily as needed (sun damage on face). FLUOROURACIL 5% + CALCIPOTRIENE 0.005% 08/15/19   [provider]  simvastatin (ZOCOR) 40 MG tablet TAKE 1 TABLET(40 MG) BY MOUTH AT BEDTIME 01/18/22   Minna Merritts, MD  triamcinolone cream (KENALOG) 0.1 % Apply 1 application topically 2 (two) times daily as needed (skin rash.).  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    Employer: [employer]  Tobacco Use   Smoking status: Never   Smokeless tobacco: Never  Vaping Use   Vaping Use: Never used  Substance and Sexual Activity   Alcohol use: No   Drug use: No   Sexual activity: Not Currently  Other Topics Concern   Not on file  Social History Narrative   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            Social Determinants of Health   Financial Resource Strain: Low Risk  (03/10/2022)   Overall Financial Resource Strain (CARDIA)    Difficulty of Paying Living Expenses: Not hard at all  Food Insecurity: No Food Insecurity (06/11/2022)   Hunger Vital Sign    Worried About Running Out of Food in the Last Year: Never true    [place] in the Last Year: Never true  Transportation Needs: No Transportation Needs (06/11/2022)   PRAPARE - Hydrologist (Medical): No    Lack of Transportation (Non-Medical): No  Physical Activity: Inactive (03/10/2022)   Exercise Vital Sign    Days of Exercise per Week: 0 days    Minutes of Exercise per Session: 0 min  Stress: No Stress Concern Present (03/10/2022)   [place]    Feeling of Stress : Not at all  Social Connections: Moderately Isolated (03/10/2022)   Social Connection and Isolation Panel [NHANES]    Frequency of Communication with Friends and Family: More than three times a week    Frequency of Social Gatherings with Friends and Family: More than three times a week  Attends Religious Services: More than 4 times per year    Active Member of Clubs or Organizations: No    Attends Archivist Meetings: Never     Marital Status: Never married  Intimate Partner Violence: Not At Risk (03/10/2022)   Humiliation, Afraid, Rape, and Kick questionnaire    Fear of Current or Ex-Partner: No    Emotionally Abused: No    Physically Abused: No    Sexually Abused: No    Family History:   The patient's family history includes Cancer in her father and mother. There is no history of Breast cancer.    ROS:  Please see the history of present illness.  All other ROS reviewed and negative.     Physical Exam/Data:   Vitals:   07/30/22 1823 07/30/22 1858  BP: (!) 178/70   Pulse: 78   Resp: 16   Temp: 98.2 F (36.8 C) 98.2 F (36.8 C)  TempSrc: Oral Oral  SpO2: 95%    No intake or output data in the 24 hours ending 07/30/22 1931    07/27/2022    9:02 AM 07/07/2022    9:15 AM 06/16/2022    8:56 AM  Last 3 Weights  Weight (lbs) 189 lb 6.4 oz 187 lb 8 oz 197 lb 9.6 oz  Weight (kg) 85.911 kg 85.049 kg 89.631 kg     There is no height or weight on file to calculate BMI.  General:  Well nourished, well developed, in no acute distress HEENT: normal Neck: no JVD Vascular: No carotid bruits; Distal pulses 2+ bilaterally   Cardiac:  RRR, 2/6 systolic murmur Lungs:  clear to auscultation bilaterally, no wheezing, rhonchi or rales  Abd: soft, nontender, no hepatomegaly  Ext: no edemam mild chronic left lower extremity swelling Musculoskeletal:  No deformities, BUE and BLE strength normal and equal Skin: warm and dry  Neuro:  CNs 2-12 intact, no focal abnormalities noted Psych:  Normal affect    EKG:  The ECG that was done  was personally reviewed and demonstrates sinus rhythm heart rate 81, prolonged PR interval  Relevant CV Studies:  Zio patch 07/07/22: Patient had a min HR of 24 bpm, max HR of 129 bpm, and avg HR of 63 bpm. Predominant underlying rhythm was Sinus Rhythm. First Degree AV Block was present. Slight P wave morphology changes were noted. Atrial Fibrillation occurred (27% burden), ranging  from  24-129 bpm (avg of 76 bpm), the longest lasting 3 days 19 hours with an avg rate of 76 bpm. 66 Pauses occurred, the longest lasting 7.9 secs (8 bpm). Atrial Fibrillation and Pause were detected within +/- 45 seconds of symptomatic patient event(s).  Isolated SVEs were rare (<1.0%), SVE Couplets were rare (<1.0%), and SVE Triplets were rare (<1.0%). Isolated VEs were rare (<1.0%), and no VE Couplets or VE Triplets were present. MD notification criteria for Symptomatic Bradycardia, Pauses and Slow  Atrial Fibrillation met   Echo 05/2021: 1. Left ventricular ejection fraction, by estimation, is 60 to 65%. The  left ventricle has normal function. The left ventricle has no regional  wall motion abnormalities. Left ventricular diastolic parameters were  normal.   2. Right ventricular systolic function is normal. The right ventricular  size is normal.   3. The mitral valve is normal in structure. Mild mitral valve  regurgitation.    Laboratory Data:  High Sensitivity Troponin:  No results for input(s): "TROPONINIHS" in the last 720 hours.    ChemistryNo results for input(s): "NA", "  K", "CL", "CO2", "GLUCOSE", "BUN", "CREATININE", "CALCIUM", "MG", "GFRNONAA", "GFRAA", "ANIONGAP" in the last 168 hours.  No results for input(s): "PROT", "ALBUMIN", "AST", "ALT", "ALKPHOS", "BILITOT" in the last 168 hours. Lipids No results for input(s): "CHOL", "TRIG", "HDL", "LABVLDL", "LDLCALC", "CHOLHDL" in the last 168 hours. HematologyNo results for input(s): "WBC", "RBC", "HGB", "HCT", "MCV", "MCH", "MCHC", "RDW", "PLT" in the last 168 hours. Thyroid No results for input(s): "TSH", "FREET4" in the last 168 hours. BNPNo results for input(s): "BNP", "PROBNP" in the last 168 hours.  DDimer No results for input(s): "DDIMER" in the last 168 hours.   Radiology/Studies:  No results found.   Assessment and Plan:   Conduction disease with pauses and A-fib RVR Suspect tachybradycardia syndrome Persistent atrial  fibrillation Placed on telemetry Hold carvedilol and amiodarone Likely will be unable to rate control prior to EP evaluation and likely PPM placement   Chronic anticoagulation This patients CHA2DS2-VASc Score and unadjusted Ischemic Stroke Rate (% per year) is equal to 7.2 % stroke rate/year from a score of 5 (HTN, DM, CAD, age, female) We will hold Eliquis with pending EP evaluation   Hypertension May continue Cardura and lisinopril   Hyperlipidemia Continue Zocor 40 mg and Zetia   DM PTA on 18-20 units Lantus daily We will implement SSI with half dose of Lantus to start   CKD 3B-4 Followed by nephrology sCr 2.02   CLL Follows with oncology. Has had a leukocytosis, does not appear infectious.   EP consult tomorrow.   Risk Assessment/Risk Scores:    Severity of Illness: The appropriate patient status for this patient is INPATIENT. Inpatient status is judged to be reasonable and necessary in order to provide the required intensity of service to ensure the patient's safety. The patient's presenting symptoms, physical exam findings, and initial radiographic and laboratory data in the context of their chronic comorbidities is felt to place them at high risk for further clinical deterioration. Furthermore, it is not anticipated that the patient will be medically stable for discharge from the hospital within 2 midnights of admission.   * I certify that at the point of admission it is my clinical judgment that the patient will require inpatient hospital care spanning beyond 2 midnights from the point of admission due to high intensity of service, high risk for further deterioration and high frequency of surveillance required.*   For questions or updates, please contact [place] Please consult [email or web address] for contact info under     Signed, Ledora Bottcher, [place]  07/30/2022 7:31 PM   Personally seen and examined. Agree with above.  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  27% atrial fibrillation burden with heart rates ranging from 24 to 130 bpm.  Brenda [name] did not have any syncopal episodes but Brenda [name] has felt dizzy at times.  Brenda [name] is also had trouble recently controlling her blood pressure.  Brenda [name] has CLL.  Has been on Eliquis, last dose this morning.  Also on amiodarone as well as carvedilol.  Last dose this morning.  Regular rate and rhythm, pleasant, left greater than right ankle edema-prior fracture  Pauses Paroxysmal atrial fibrillation Diabetes with hypertension  Plan -EP consult for pacemaker. -Brenda [name] has demonstrated tachy/bradycardia syndrome -Brenda [name] has required amiodarone as well as carvedilol. -We will hold Eliquis.  No IV heparin at this point. -Diabetes-half dose long-acting insulin -Anxiety-Xanax 1 mg twice daily. -CLL-stable, hematology following.  Platelet count usually  in the 150 range. -CKD stage IIIb-creatinine 2.0 range.  Followed by nephrology.  Candee Furbish, MD

## 2022-07-30 NOTE — Telephone Encounter (Signed)
Zio patch reviewed, showing 66 pauses occurring with the longest lasting 7.9 seconds at 6:53 AM.  Multiple pauses were associated with a patient triggered event including a 3.1-second pause at 6:46 AM, and multiple 3 to 6.1-second pause at 8:54 AM.  Monitor also notable for a 27% A-fib burden with ventricular rates ranging from 24 bpm to 129 bpm.  Patient currently asymptomatic on the phone this afternoon.  She has been advised to hold carvedilol and amiodarone.  She has also been advised to have a family/friend/EMS transport her to the [facility] emergency department for further evaluation, up to and including EP evaluation for consideration of pacemaker implantation prior to discharge.  Patient was given strict precautions to not to drive herself and to proceed to the ED immediately.  Our team at [facility] has been notified the patient will be arriving and need formal EP evaluation.  Case reviewed with patient's primary cardiologist.  Zio patch results will either be uploaded electronically, or scanned in, so they will be available for review at [place].  If they are not, cardiology can review the report through the [place].

## 2022-07-30 NOTE — ED Provider Triage Note (Signed)
Emergency Medicine Provider Triage Evaluation Note  AMANDO ISHIKAWA , a 71 y.o. female  was evaluated in triage.  Pt complains of A-fib with sinus pause.  Patient was sent in by her cardiologist.  She has been wearing a cardiac monitor for the past 2 weeks.  She was called and told to come urgently to the emergency department because she is in a chronic low grade atrial fibrillation with episodes of sinus pauses up to 8 seconds.  Review of Systems  Positive: Abnormal heart rhythm Negative: Near syncope  Physical Exam  BP (!) 178/70   Pulse 78   Temp 98.2 F (36.8 C) (Oral)   Resp 16   SpO2 95%  Gen:   Awake, no distress   Resp:  Normal effort  MSK:   Moves extremities without difficulty  Other:    Medical Decision Making  Medically screening exam initiated at 6:45 PM.  Appropriate orders placed.  ALLEENE STOY was informed that the remainder of the evaluation will be completed by another provider, this initial triage assessment does not replace that evaluation, and the importance of remaining in the ED until their evaluation is complete.  Work-up initiated   Margarita Mail, PA-C 07/30/22 1847

## 2022-07-30 NOTE — Assessment & Plan Note (Signed)
Baseline creat of 2.0 Labs today are still pending.

## 2022-07-30 NOTE — Telephone Encounter (Signed)
Caller reporting abnormal reading.

## 2022-07-30 NOTE — Telephone Encounter (Signed)
   Cardiac Monitor Alert  Date of alert:  07/30/2022   Patient Name: Brenda [name]  DOB: [birth year]  MRN: [id number]   [place] Cardiologist: Ida Rogue, MD  [facility] EP:  None    Monitor Information: Long Term Monitor [ZioXT]  Reason:  Persistent AFIB, Dizziness Ordering provider:  Christell Faith, PA   Alert Pause(s) - Longest:  7.9 seconds seconds 66 episodes of pauses. 3 episodes of symptomatic bradycardia 31-34 bpm lasting 30 seconds This is the 1st alert for this rhythm.   Next Cardiology Appointment   Date:  08/13/22  Provider:  Christell Faith, PA  Patient was not contacted  Arrhythmia, symptoms and history reviewed with  .   Plan:      Other:    Lamar Laundry, RN  07/30/2022 3:42 PM

## 2022-07-30 NOTE — Progress Notes (Signed)
ANTICOAGULATION CONSULT NOTE - Initial Consult  Pharmacy Consult for Heparin infusion Indication: atrial fibrillation  Allergies  Allergen Reactions   Gadolinium Derivatives Other (See Comments)   Nsaids Other (See Comments)    Due to kidney function   Amlodipine     Swelling     Contrast Media [Iodinated Contrast Media] Other (See Comments)    Patient states unable to take / use due to kidney function    Patient Measurements:   Heparin Dosing Weight: 65.6 kg  Vital Signs: Temp: 98.2 F (36.8 C) (09/29 1858) Temp Source: Oral (09/29 1858) BP: 143/61 (09/29 2030) Pulse Rate: 67 (09/29 2030)  Labs: Recent Labs    07/30/22 1950  HGB 12.3  HCT 38.6  PLT 115*  CREATININE 1.88*    Estimated Creatinine Clearance: 26 mL/min (A) (by C-G formula based on SCr of 1.88 mg/dL (H)).   Medical History: Past Medical History:  Diagnosis Date   Anxiety    Arthritis    Atrial fibrillation (HCC)    CLL (chronic lymphocytic leukemia) ([place])    Dx. 2019 Dr. 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 Particia Nearing first visit 11-2019    Medications:  (Not in a hospital admission)   Assessment: 71 yo M presenting with tachybradycardia syndrome  and history of persistent Afib, CAD, T2DM, HLD, CLL, and CKD Stage 3b-4. Pt was taking apixaban 86m BID prior to admission for Afib. Last dose was taken at ~07:30 on 07/30/22. Cardiology has been consulted and is considering PPM placement possibly on Monday, 10/2. Pharmacy consulted to dose and manage heparin infusion for AFib.   Hgb 12.3, Plt 115  Goal of Therapy:  Heparin level 0.3-0.7 units/ml aPTT 66-102 seconds Monitor platelets by anticoagulation protocol: Yes   Plan:  Start heparin infusion at 1000 units/hr No bolus due to recent apixaban administration Check aPTT in 8 hours Monitor daily CBC, aPTT, heparin level, and for s/sx of bleeding.  aPTT will need to be monitored until heparin levels are correlating due to recent apixaban administration F/u cardiology plans for procedure/intervention  CLuisa Hart PharmD, BCPS Clinical Pharmacist 07/30/2022 8:45 PM   [facility] to [facility]

## 2022-07-30 NOTE — ED Notes (Signed)
Pt ambulated with friend to restroom without incident.

## 2022-07-30 NOTE — Telephone Encounter (Signed)
ZIO report has been scanned in and is under the media tab under home monitoring.

## 2022-07-30 NOTE — Assessment & Plan Note (Signed)
Following with [facility] endocrine. Will leave on Lantus, Jardiance for the moment CBG checks AC/HS.

## 2022-07-31 DIAGNOSIS — I495 Sick sinus syndrome: Secondary | ICD-10-CM | POA: Diagnosis not present

## 2022-07-31 LAB — BASIC METABOLIC PANEL
Anion gap: 6 (ref 5–15)
BUN: 32 mg/dL — ABNORMAL HIGH (ref 8–23)
CO2: 22 mmol/L (ref 22–32)
Calcium: 8.9 mg/dL (ref 8.9–10.3)
Chloride: 112 mmol/L — ABNORMAL HIGH (ref 98–111)
Creatinine, Ser: 1.72 mg/dL — ABNORMAL HIGH (ref 0.44–1.00)
GFR, Estimated: 31 mL/min — ABNORMAL LOW (ref >60)
Glucose, Bld: 102 mg/dL — ABNORMAL HIGH (ref 70–99)
Potassium: 4.2 mmol/L (ref 3.5–5.1)
Sodium: 140 mmol/L (ref 135–145)

## 2022-07-31 LAB — CBC
HCT: 37.2 % (ref 36.0–46.0)
Hemoglobin: 11.5 g/dL — ABNORMAL LOW (ref 12.0–15.0)
MCH: 29.1 pg (ref 26.0–34.0)
MCHC: 30.9 g/dL (ref 30.0–36.0)
MCV: 94.2 fL (ref 80.0–100.0)
Platelets: 134 10*3/uL — ABNORMAL LOW (ref 150–400)
RBC: 3.95 MIL/uL (ref 3.87–5.11)
RDW: 13.6 % (ref 11.5–15.5)
WBC: 9.7 10*3/uL (ref 4.0–10.5)
nRBC: 0 % (ref 0.0–0.2)

## 2022-07-31 LAB — GLUCOSE, CAPILLARY
Glucose-Capillary: 98 mg/dL (ref 70–99)
Glucose-Capillary: 95 mg/dL (ref 70–99)

## 2022-07-31 LAB — LIPID PANEL
Cholesterol: 104 mg/dL (ref 0–200)
HDL: 21 mg/dL — ABNORMAL LOW (ref >40)
LDL Cholesterol: 44 mg/dL (ref 0–99)
Total CHOL/HDL Ratio: 5 RATIO
Triglycerides: 193 mg/dL — ABNORMAL HIGH (ref <150)
VLDL: 39 mg/dL (ref 0–40)

## 2022-07-31 LAB — CBG MONITORING, ED
Glucose-Capillary: 94 mg/dL (ref 70–99)
Glucose-Capillary: 92 mg/dL (ref 70–99)

## 2022-07-31 MED ORDER — EZETIMIBE 10 MG PO TABS
10.0000 mg | ORAL_TABLET | Freq: Every day | ORAL | Status: DC
  Administered 2022-07-31 – 2022-08-01 (×2): 10 mg via ORAL
  Filled 2022-07-31 (×2): qty 1

## 2022-07-31 MED ORDER — BUPROPION HCL ER (XL) 150 MG PO TB24
300.0000 mg | ORAL_TABLET | Freq: Every day | ORAL | Status: DC
  Administered 2022-07-31 – 2022-08-01 (×2): 300 mg via ORAL
  Filled 2022-07-31: qty 2
  Filled 2022-07-31: qty 1

## 2022-07-31 MED ORDER — SIMVASTATIN 20 MG PO TABS
40.0000 mg | ORAL_TABLET | Freq: Every day | ORAL | Status: DC
  Administered 2022-07-31: 40 mg via ORAL
  Filled 2022-07-31: qty 2

## 2022-07-31 MED ORDER — INSULIN GLARGINE-YFGN 100 UNIT/ML ~~LOC~~ SOLN
10.0000 [IU] | Freq: Every day | SUBCUTANEOUS | Status: DC
  Filled 2022-07-31 (×2): qty 0.1

## 2022-07-31 MED ORDER — DOXAZOSIN MESYLATE 2 MG PO TABS
2.0000 mg | ORAL_TABLET | Freq: Every day | ORAL | Status: DC
  Administered 2022-07-31: 2 mg via ORAL
  Filled 2022-07-31 (×2): qty 1

## 2022-07-31 MED ORDER — ALPRAZOLAM 0.5 MG PO TABS
1.0000 mg | ORAL_TABLET | Freq: Two times a day (BID) | ORAL | Status: DC
  Administered 2022-07-31 – 2022-08-01 (×3): 1 mg via ORAL
  Filled 2022-07-31: qty 2
  Filled 2022-07-31: qty 4
  Filled 2022-07-31: qty 2

## 2022-07-31 MED ORDER — HEPARIN SODIUM (PORCINE) 5000 UNIT/ML IJ SOLN
5000.0000 [IU] | Freq: Three times a day (TID) | INTRAMUSCULAR | Status: DC
  Administered 2022-07-31 – 2022-08-01 (×4): 5000 [IU] via SUBCUTANEOUS
  Filled 2022-07-31 (×4): qty 1

## 2022-07-31 MED ORDER — PANTOPRAZOLE SODIUM 40 MG PO TBEC
40.0000 mg | DELAYED_RELEASE_TABLET | Freq: Every day | ORAL | Status: DC
  Administered 2022-07-31 – 2022-08-01 (×2): 40 mg via ORAL
  Filled 2022-07-31 (×2): qty 1

## 2022-07-31 MED ORDER — EMPAGLIFLOZIN 10 MG PO TABS
10.0000 mg | ORAL_TABLET | Freq: Every day | ORAL | Status: DC
  Administered 2022-07-31 – 2022-08-01 (×2): 10 mg via ORAL
  Filled 2022-07-31 (×2): qty 1

## 2022-07-31 MED ORDER — LISINOPRIL 20 MG PO TABS
20.0000 mg | ORAL_TABLET | Freq: Two times a day (BID) | ORAL | Status: DC
  Administered 2022-07-31 – 2022-08-01 (×3): 20 mg via ORAL
  Filled 2022-07-31 (×4): qty 1

## 2022-07-31 NOTE — ED Notes (Signed)
Cardiology at bedside.

## 2022-07-31 NOTE — ED Notes (Signed)
ED TO INPATIENT HANDOFF REPORT  ED Nurse Name and Phone #: [phone]   S Name/Age/Gender Brenda [name] 71 y.o. female Room/Bed: 006C/006C  Code Status   Code Status: Full Code  Home/SNF/Other  Patient oriented to: self, place, time, and situation Is this baseline? Yes   Triage Complete: Triage complete  Chief Complaint Tachy-brady syndrome ([place]) [I49.5] Sick sinus syndrome ([place]) [I49.5]  Triage Note Pt sent here by cardiologist after heart monitor readings showed atrial fibrillations w/ up to 8 second pauses. Pt denies cp/sob, reports intermittent "fluttering" in her chest. Pt has no hx of a-fib.   Allergies Allergies  Allergen Reactions   Gadolinium Derivatives Other (See Comments)   Nsaids Other (See Comments)    Due to kidney function   Amlodipine     Swelling-  leg swell    Contrast Media [Iodinated Contrast Media] Other (See Comments)    Patient states unable to take / use due to kidney function    Level of Care/Admitting Diagnosis ED Disposition     ED Disposition  Admit   Condition  --   [place]: [place] [[id number]]  Level of Care: Telemetry Cardiac [[id number]]  May admit patient to [facility] or [facility] if equivalent level of care is available:: No  Covid Evaluation: Asymptomatic - no recent exposure (last 10 days) testing not required  Diagnosis: Sick sinus syndrome ([place]) [676195]  Admitting Physician: [place], [place]  Attending Physician: Jerline Pain [[id number]]  Bed request comments: 6e  Certification:: I certify this patient will need inpatient services for at least 2 midnights  Estimated Length of Stay: 4          B Medical/Surgery History Past Medical History:  Diagnosis Date   Anxiety    Arthritis    Atrial fibrillation ([place])    CLL (chronic lymphocytic leukemia) ([place])    Dx. 2019 Dr. 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 Particia Nearing first visit 11-2019   Past Surgical History:  Procedure Laterality Date   ABDOMINAL HYSTERECTOMY  1998   COLONOSCOPY     EYE SURGERY Bilateral    cataracts   EYE SURGERY Left    retina tear   FEMUR IM NAIL  10/25/2012   Procedure: INTRAMEDULLARY (IM) NAIL FEMORAL;  Surgeon: Mauri Pole, MD;  [facility];  Service: Orthopedics;  Laterality: Left;   HIP SURGERY     Fracture L IM nail and 3 rods   left knee arthroscopy  12/2010  LYMPH NODE BIOPSY     axillary left 12-2018   REFRACTIVE SURGERY     skin cancer removed right and and lower forearm     squamoun in situ   TOTAL KNEE ARTHROPLASTY  12/20/2011   Procedure: TOTAL KNEE ARTHROPLASTY;  Surgeon: Gearlean Alf, MD;  [facility];  Service: Orthopedics;  Laterality: Left;  Failed attempt at spinal    TOTAL SHOULDER ARTHROPLASTY Right 08/19/2016   Procedure: RIGHT TOTAL SHOULDER ARTHROPLASTY;  Surgeon: Justice Britain, MD;  Location: [place];  Service: Orthopedics;  Laterality: Right;   TOTAL SHOULDER ARTHROPLASTY Left 01/10/2020   Procedure: TOTAL SHOULDER ARTHROPLASTY;  Surgeon: Justice Britain, MD;  [facility];  Service: Orthopedics;  Laterality: Left;  120mn     A IV Location/Drains/Wounds Patient Lines/Drains/Airways Status      Active Line/Drains/Airways     Name Placement date Placement time Site Days   Peripheral IV 07/30/22 20 G Anterior;Distal;Right Forearm 07/30/22  1955  Forearm  1   External Urinary Catheter 06/22/20  1500  --  769   Incision (Closed) 01/10/20 Shoulder Left 01/10/20  0851  -- 933   Wound / Incision (Open or Dehisced) (MASD) Moisture Associated Skin Damage Abdomen Right;Left;Lower Pink area in folds and blanchable on bottom  --  --  Abdomen  --            Intake/Output Last 24 hours No intake or output data in the 24 hours ending 07/31/22 1239  Labs/Imaging Results for orders placed or performed during the hospital encounter of 07/30/22 (from the past 48 hour(s))  Basic metabolic panel     Status: Abnormal   Collection Time: 07/30/22  7:50 PM  Result Value Ref Range   Sodium 139 135 - 145 mmol/L   Potassium 4.6 3.5 - 5.1 mmol/L   Chloride 111 98 - 111 mmol/L   CO2 19 (L) 22 - 32 mmol/L   Glucose, Bld 143 (H) 70 - 99 mg/dL    Comment: Glucose reference range applies only to samples taken after fasting for at least 8 hours.   BUN 41 (H) 8 - 23 mg/dL   Creatinine, Ser 1.88 (H) 0.44 - 1.00 mg/dL   Calcium 9.0 8.9 - 10.3 mg/dL   GFR, Estimated 28 (L) >60 mL/min    Comment: (NOTE) Calculated using the CKD-EPI Creatinine Equation (2021)    Anion gap 9 5 - 15    Comment: Performed at [facility] [place] [address] Magnesium     Status: None   Collection Time: 07/30/22  7:50 PM  Result Value Ref Range   Magnesium 1.9 1.7 - 2.4 mg/dL    Comment: Performed at [facility] [address], [place] CBC     Status: Abnormal   Collection Time: 07/30/22  7:50 PM  Result Value Ref Range   WBC 11.6 (H) 4.0 - 10.5 K/uL   RBC 4.12 3.87 - 5.11 MIL/uL   Hemoglobin 12.3 12.0 - 15.0 g/dL   HCT 38.6 36.0 - 46.0 %   MCV 93.7 80.0 - 100.0 fL   MCH 29.9 26.0 - 34.0 pg   MCHC 31.9 30.0 - 36.0 g/dL   RDW 13.7 11.5 - 15.5 %   Platelets  115 (L) 150 - 400 K/uL    Comment: REPEATED TO VERIFY   nRBC 0.0 0.0 - 0.2 %    Comment: Performed at [facility] [address]  [address], [place], [place] [address]  TSH     Status: Abnormal   Collection Time: 07/30/22  7:50 PM  Result Value Ref Range   TSH 11.721 (H) 0.350 - 4.500 uIU/mL    Comment: Performed by a 3rd Generation assay with a functional sensitivity of <=0.01 uIU/mL. Performed at [facility], [place] [address], [place], [place] [address]   CBG monitoring, ED     Status: None   Collection Time: 07/30/22 10:37 PM  Result Value Ref Range   Glucose-Capillary 83 70 - 99 mg/dL    Comment: Glucose reference range applies only to samples taken after fasting for at least 8 hours.  CBC     Status: Abnormal   Collection Time: 07/31/22  5:00 AM  Result Value Ref Range   WBC 9.7 4.0 - 10.5 K/uL   RBC 3.95 3.87 - 5.11 MIL/uL   Hemoglobin 11.5 (L) 12.0 - 15.0 g/dL   HCT 37.2 36.0 - 46.0 %   MCV 94.2 80.0 - 100.0 fL   MCH 29.1 26.0 - 34.0 pg   MCHC 30.9 30.0 - 36.0 g/dL   RDW 13.6 11.5 - 15.5 %   Platelets 134 (L) 150 - 400 K/uL   nRBC 0.0 0.0 - 0.2 %    Comment: Performed at [facility], [place] [address], [place], [place] [address]  Basic metabolic panel     Status: Abnormal   Collection Time: 07/31/22  5:00 AM  Result Value Ref Range   Sodium 140 135 - 145 mmol/L   Potassium 4.2 3.5 - 5.1 mmol/L   Chloride 112 (H) 98 - 111 mmol/L   CO2 22 22 - 32 mmol/L   Glucose, Bld 102 (H) 70 - 99 mg/dL    Comment: Glucose reference range applies only to samples taken after fasting for at least 8 hours.   BUN 32 (H) 8 - 23 mg/dL   Creatinine, Ser 1.72 (H) 0.44 - 1.00 mg/dL   Calcium 8.9 8.9 - 10.3 mg/dL   GFR, Estimated 31 (L) >60 mL/min    Comment: (NOTE) Calculated using the CKD-EPI Creatinine Equation (2021)    Anion gap 6 5 - 15    Comment: Performed at [facility] [address], [place],  [address]  Lipid panel     Status: Abnormal   Collection Time:  07/31/22  5:00 AM  Result Value Ref Range   Cholesterol 104 0 - 200 mg/dL   Triglycerides 193 (H) <150 mg/dL   HDL 21 (L) >40 mg/dL   Total CHOL/HDL Ratio 5.0 RATIO   VLDL 39 0 - 40 mg/dL   LDL Cholesterol 44 0 - 99 mg/dL    Comment:        Total Cholesterol/HDL:CHD Risk Coronary Heart Disease Risk Table                     Men   Women  1/2 Average Risk   3.4   3.3  Average Risk       5.0   4.4  2 X Average Risk   9.6   7.1  3 X Average Risk  23.4   11.0        Use the calculated Patient Ratio above and the CHD Risk Table to determine the patient's CHD Risk.        ATP III CLASSIFICATION (LDL):  <100     mg/dL   Optimal  100-129  mg/dL  Near or Above                    Optimal  130-159  mg/dL   Borderline  160-189  mg/dL   High  >190     mg/dL   Very High Performed at [facility] [address] [place], [place] [address]   CBG monitoring, ED     Status: None   Collection Time: 07/31/22  8:23 AM  Result Value Ref Range   Glucose-Capillary 94 70 - 99 mg/dL    Comment: Glucose reference range applies only to samples taken after fasting for at least 8 hours.  CBG monitoring, ED     Status: None   Collection Time: 07/31/22 11:54 AM  Result Value Ref Range   Glucose-Capillary 92 70 - 99 mg/dL    Comment: Glucose reference range applies only to samples taken after fasting for at least 8 hours.   No results found.  Pending Labs Unresulted Labs (From admission, onward)     Start     Ordered   07/31/22 0500  CBC  Daily at 5am,   R      07/30/22 2101   Signed and Held  CBC  (Pharmacologic VTE prophylaxis)  Once,   R       Comments: Baseline for heparin therapy IF NOT ALREADY DRAWN.  Notify MD if PLT < 100 K.    Signed and Held            Vitals/Pain Today's Vitals   07/31/22 1000 07/31/22 1205 07/31/22 1219 07/31/22 1222  BP: (!) 140/67     Pulse: 62     Resp: 16     Temp:  98.2 F (36.8 C) 98.7 F (37.1 C)   TempSrc:  Oral Oral   SpO2: 95%      PainSc:    0-No pain    Isolation Precautions No active isolations  Medications Medications  acetaminophen (TYLENOL) tablet 650 mg (has no administration in time range)    Or  acetaminophen (TYLENOL) suppository 650 mg (has no administration in time range)  ondansetron (ZOFRAN) tablet 4 mg (has no administration in time range)    Or  ondansetron (ZOFRAN) injection 4 mg (has no administration in time range)  doxazosin (CARDURA) tablet 2 mg (has no administration in time range)  ezetimibe (ZETIA) tablet 10 mg (10 mg Oral Given 07/31/22 0956)  lisinopril (ZESTRIL) tablet 20 mg (20 mg Oral Not Given 07/31/22 0955)  simvastatin (ZOCOR) tablet 40 mg (has no administration in time range)  ALPRAZolam (XANAX) tablet 1 mg (1 mg Oral Not Given 07/31/22 0957)  buPROPion (WELLBUTRIN XL) 24 hr tablet 300 mg (300 mg Oral Given 07/31/22 0957)  insulin glargine-yfgn (SEMGLEE) injection 10 Units (has no administration in time range)  empagliflozin (JARDIANCE) tablet 10 mg (10 mg Oral Given 07/31/22 0957)  pantoprazole (PROTONIX) EC tablet 40 mg (40 mg Oral Given 07/31/22 0955)  heparin injection 5,000 Units (5,000 Units Subcutaneous Given 07/31/22 0519)  insulin aspart (novoLOG) injection 0-15 Units ( Subcutaneous Not Given 07/31/22 1202)    Mobility walks Low fall risk   Focused Assessments Cardiac Assessment Handoff:  Cardiac Rhythm: Normal sinus rhythm Lab Results  Component Value Date   CKTOTAL 209 06/22/2020   No results found for: "DDIMER" Does the Patient currently have chest pain? No    R Recommendations: See Admitting Provider Note  Report given to:   Additional Notes:

## 2022-07-31 NOTE — ED Notes (Signed)
Pt is NSR on monitor 

## 2022-07-31 NOTE — Consult Note (Signed)
ELECTROPHYSIOLOGY CONSULT NOTE    Patient ID: Brenda [name] MRN: [id number], DOB/AGE: [birth year] 71 y.o.  Admit date: 07/30/2022 Date of Consult: 07/31/2022  Primary Physician: [name], MD Primary Cardiologist: Ida Rogue, MD Electrophysiologist: new to EP  Patient Profile: Brenda [name] is a 71 y.o. female with a history of coronary artery calcification by CT imaging, persistent Afib, DM2, HLD, CLL, CKD stage IIIb-IV, morbid obesity, and anemia who presents for follow who is being seen today for the evaluation of tachy-brady syndrome at the request of Dr. Marlou Porch.  HPI:  KEORA [name] is a 71 y.o. female with a history of paroxysmal atrial fibrillation with RVR diagnosed in May 2022.  In follow-up visit she was noted to be intermittently in sinus rhythm and an atrial fibrillation with RVR.  She was started on amiodarone in addition to metoprolol and Eliquis.  On this regimen, her heart rate was typically in the 50s and 60s, but she occasionally had to hold her metoprolol due to slower rates.  A Zio patch was placed earlier this month which showed 6-6 pauses up to 7.9 seconds at 6:53 AM.  Multiple pauses ranging from 3 to 6 seconds in duration were associated with patient triggered events.  She was advised to hold metoprolol and amiodarone yesterday.  Regarding her atrial fibrillation, she has largely asymptomatic and only experiences occasional mild palpitations. She also notes some fatigue with AF. She has been taking amiodarone 174m daily and carvedilol 12.579mas need for a pulse rate > 60 bpm.  She denies chest pain, palpitations, dyspnea, PND, orthopnea, nausea, vomiting, dizziness, syncope, edema, or weight gain.  Past Medical History:  Diagnosis Date   Anxiety    Arthritis    Atrial fibrillation (H[facility])    CLL (chronic lymphocytic leukemia) ([place]   Dx. 2019 Dr. 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 JeParticia Nearingirst visit 11-2019     Surgical History:  Past Surgical History:  Procedure Laterality Date   ABDOMINAL HYSTERECTOMY  1998   COLONOSCOPY     EYE SURGERY Bilateral    cataracts   EYE SURGERY Left    retina tear   FEMUR IM NAIL  10/25/2012   Procedure: INTRAMEDULLARY (IM) NAIL FEMORAL;  Surgeon: MaMauri PoleMD;  [facility];  Service: Orthopedics;  Laterality: Left;   HIP SURGERY     Fracture L IM nail and 3 rods   left knee arthroscopy  12/2010   LYMPH NODE BIOPSY     axillary  left 12-2018   REFRACTIVE SURGERY     skin cancer removed right and and lower forearm     squamoun in situ   TOTAL KNEE ARTHROPLASTY  12/20/2011   Procedure: TOTAL KNEE ARTHROPLASTY;  Surgeon: Gearlean Alf, MD;  [facility];  Service: Orthopedics;  Laterality: Left;  Failed attempt at spinal    TOTAL SHOULDER ARTHROPLASTY Right 08/19/2016   Procedure: RIGHT TOTAL SHOULDER ARTHROPLASTY;  Surgeon: Justice Britain, MD;  Location: [place];  Service: Orthopedics;  Laterality: Right;   TOTAL SHOULDER ARTHROPLASTY Left 01/10/2020   Procedure: TOTAL SHOULDER ARTHROPLASTY;  Surgeon: Justice Britain, MD;  [facility];  Service: Orthopedics;  Laterality: Left;  19mn     (Not in a hospital  admission)   Inpatient Medications:   ALPRAZolam  1 mg Oral BID   buPROPion  300 mg Oral Daily   doxazosin  2 mg Oral QPC lunch   empagliflozin  10 mg Oral Daily   ezetimibe  10 mg Oral Daily   heparin  5,000 Units Subcutaneous Q8H   insulin aspart  0-15 Units Subcutaneous TID WC   insulin glargine-yfgn  10 Units Subcutaneous QHS   lisinopril  20 mg Oral BID   pantoprazole  40 mg Oral Daily   simvastatin  40 mg Oral q1800    Allergies:  Allergies  Allergen Reactions   Gadolinium Derivatives Other (See Comments)   Nsaids Other (See Comments)    Due to kidney function   Amlodipine     Swelling-  leg swell    Contrast Media [Iodinated Contrast Media] Other (See Comments)    Patient states unable to take / use due to kidney function    Social History   Socioeconomic History   Marital status: Single    Spouse name: Not on file   Number of children: Not on file   Years of education: Not on file   Highest education level: Not on file  Occupational History   Occupation: RTherapist, 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    Employer: [employer]  Tobacco Use   Smoking status: Never   Smokeless tobacco: Never  Vaping Use   Vaping Use: Never used  Substance and Sexual Activity   Alcohol use: No   Drug use: No   Sexual activity: Not Currently  Other Topics Concern   Not on file  Social History Narrative   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[facility] [employer]            Social Determinants of Health   Financial Resource Strain: Low Risk  (03/10/2022)   Overall Financial Resource Strain (CARDIA)    Difficulty of Paying Living Expenses: Not hard at all  Food Insecurity: No Food Insecurity (06/11/2022)   Hunger Vital Sign    Worried About Running Out of Food in the Last Year: Never true    RWest Roy Lakein the Last Year: Never true  Transportation Needs: No Transportation Needs (06/11/2022)   PRAPARE - THydrologist(Medical): No    Lack of Transportation (Non-Medical): No  Physical Activity:  Inactive (03/10/2022)   Exercise Vital Sign    Days of Exercise per Week: 0 days    Minutes of Exercise per Session: 0 min  Stress: No Stress Concern Present (03/10/2022)   FS[place]   Feeling of Stress : Not at all  Social Connections: Moderately Isolated (03/10/2022)   Social Connection and Isolation  Panel [NHANES]    Frequency of Communication with Friends and Family: More than three times a week    Frequency of Social Gatherings with Friends and Family: More than three times a week    Attends Religious Services: More than 4 times per year    Active Member of [employer] or Organizations: No    Attends Archivist Meetings: Never    Marital Status: Never married  Intimate Partner Violence: Not At Risk (03/10/2022)   Humiliation, Afraid, Rape, and Kick questionnaire    Fear of Current or Ex-Partner: No    Emotionally Abused: No    Physically Abused: No    Sexually Abused: No     Family History  Problem Relation Age of Onset   Cancer Mother        breast cancer   Cancer Father        plasma sarcoma   Breast cancer Neg Hx      Review of Systems: All other systems reviewed and are otherwise negative except as noted above.  Physical Exam: Vitals:   07/31/22 0744 07/31/22 0800 07/31/22 0900 07/31/22 1000  BP:  133/67 (!) 121/57 (!) 140/67  Pulse:  63 (!) 57 62  Resp:  17 18 16   Temp: 98.4 F (36.9 C)     TempSrc: Oral     SpO2:  96% 97% 95%    GEN- The patient is well appearing, alert and oriented x 3 today.   HEENT: normocephalic, atraumatic; sclera clear, conjunctiva pink; hearing intact; oropharynx clear; neck supple Lungs- Clear to ausculation bilaterally, normal work of breathing.  No wheezes, rales, rhonchi Heart- Regular rate and rhythm, no murmurs, rubs or gallops GI- soft, non-tender, non-distended, bowel sounds present Extremities- no clubbing, cyanosis, or edema; DP/PT/radial pulses 2+  bilaterally MS- no significant deformity or atrophy Skin- warm and dry, no rash or lesion Psych- euthymic mood, full affect Neuro- strength and sensation are intact  Labs:   Lab Results  Component Value Date   WBC 9.7 07/31/2022   HGB 11.5 (L) 07/31/2022   HCT 37.2 07/31/2022   MCV 94.2 07/31/2022   PLT 134 (L) 07/31/2022    Recent Labs  Lab 07/31/22 0500  NA 140  K 4.2  CL 112*  CO2 22  BUN 32*  CREATININE 1.72*  CALCIUM 8.9  GLUCOSE 102*      Radiology/Studies: No results found.  EKG:SR. Left axis deviation, low voltage (personally reviewed)  TELEMETRY: Sinus rhythm, rates > 60 bpm. Single pause of about 2 seconds (personally reviewed)  TTE: 05/22/2021 1. Left ventricular ejection fraction, by estimation, is 60 to 65%. The  left ventricle has normal function. The left ventricle has no regional  wall motion abnormalities. Left ventricular diastolic parameters were  normal.   2. Right ventricular systolic function is normal. The right ventricular  size is normal.   3. The mitral valve is normal in structure. Mild mitral valve  regurgitation.  4. Left atrium is normal in size  Zio monitor 07/2022: Patient had a min HR of 24 bpm, max HR of 129 bpm, and avg HR of 63 bpm. Predominant underlying rhythm was Sinus Rhythm.  Atrial Fibrillation burden 27%, ranging from 24-129 bpm (avg of 76 bpm), the longest lasting 3 days 19 hours with an avg rate of 76 bpm.   66 Pauses occurred, the longest lasting 7.9 secs (8 bpm). Atrial Fibrillation and Pause were detected within +/- 45 seconds of symptomatic patient event(s). Isolated SVEs were rare (<  1.0%), SVE Couplets were rare (<1.0%), and SVE Triplets were rare (<1.0%). Isolated VEs were rare (<1.0%), and no VE Couplets or VE Triplets were present. MD notification criteria for Symptomatic Bradycardia, Pauses and Slow Atrial Fibrillation met  Assessment/Plan: 1.  Atrial fibrillation: paroxysmal, minimally symptomatic. Burden  of 27% despite being on amiodarone. Rates are well-controlled during episodes of AF with average V-rate of 76 bpm. I am not sure how strong the indication for rhythm control is for her with minimal symptoms. The only alternative to amiodarone would be ablation. No plans for pacemaker today. We will continue to observe for now.  2. Tachy-brady syndrome: rates in AF were well-controlled as documented on the Ziopatch with an average rate of 76 bpm. 3. CLL: WBC normal today 4. Hypertension: BP controlled at present 5. DM:  continue insulin   For questions or updates, please contact [place] Please consult [email or web address] for contact info under Cardiology/STEMI.  Signed, Doralee Albino, MD 07/31/2022 11:12 AM

## 2022-07-31 NOTE — ED Notes (Signed)
This RN consulted with pharmacist to verify discontinuation of heparin IV. Provider H&P with plan of care reviewed as well. Heparin drip discontinued at this time.

## 2022-08-01 ENCOUNTER — Other Ambulatory Visit: Payer: Self-pay | Admitting: Student

## 2022-08-01 ENCOUNTER — Encounter: Payer: Self-pay | Admitting: Internal Medicine

## 2022-08-01 DIAGNOSIS — I4811 Longstanding persistent atrial fibrillation: Secondary | ICD-10-CM

## 2022-08-01 DIAGNOSIS — I495 Sick sinus syndrome: Secondary | ICD-10-CM

## 2022-08-01 LAB — CBC
HCT: 36 % (ref 36.0–46.0)
Hemoglobin: 11.3 g/dL — ABNORMAL LOW (ref 12.0–15.0)
MCH: 29.4 pg (ref 26.0–34.0)
MCHC: 31.4 g/dL (ref 30.0–36.0)
MCV: 93.5 fL (ref 80.0–100.0)
Platelets: 124 10*3/uL — ABNORMAL LOW (ref 150–400)
RBC: 3.85 MIL/uL — ABNORMAL LOW (ref 3.87–5.11)
RDW: 13.5 % (ref 11.5–15.5)
WBC: 9.7 10*3/uL (ref 4.0–10.5)
nRBC: 0 % (ref 0.0–0.2)

## 2022-08-01 LAB — GLUCOSE, CAPILLARY
Glucose-Capillary: 99 mg/dL (ref 70–99)
Glucose-Capillary: 93 mg/dL (ref 70–99)

## 2022-08-01 NOTE — Progress Notes (Signed)
Ordered 2 week live MCOT monitor for further evaluation of tachybrady syndrome. Please see discharge summary from today for additional information.  Darreld Mclean, PA-C 08/01/2022 11:09 AM

## 2022-08-01 NOTE — Plan of Care (Signed)

## 2022-08-01 NOTE — Progress Notes (Signed)
Progress Note  Patient Name: Brenda [name] Date of Encounter: 08/01/2022  Primary Cardiologist: Ida Rogue, MD   Subjective   No complaints though she is very anxious about what's going on with her heart rhythm.  Inpatient Medications    Scheduled Meds:  ALPRAZolam  1 mg Oral BID   buPROPion  300 mg Oral Daily   doxazosin  2 mg Oral QPC lunch   empagliflozin  10 mg Oral Daily   ezetimibe  10 mg Oral Daily   heparin  5,000 Units Subcutaneous Q8H   insulin aspart  0-15 Units Subcutaneous TID WC   insulin glargine-yfgn  10 Units Subcutaneous QHS   lisinopril  20 mg Oral BID   pantoprazole  40 mg Oral Daily   simvastatin  40 mg Oral q1800   Continuous Infusions:  PRN Meds: acetaminophen **OR** acetaminophen, ondansetron **OR** ondansetron (ZOFRAN) IV   Vital Signs    Vitals:   07/31/22 1321 07/31/22 2018 08/01/22 0015 08/01/22 0410  BP: (!) 144/67 126/62 (!) 111/53 (!) 122/50  Pulse: 70  63   Resp: 18  15   Temp: 99.3 F (37.4 C) 98.6 F (37 C) 98.3 F (36.8 C) 98 F (36.7 C)  TempSrc: Oral Oral Oral Oral  SpO2: 95% 95% 93%   Weight: 83.6 kg   84 kg  Height: 5' (1.524 m)       Intake/Output Summary (Last 24 hours) at 08/01/2022 0943 Last data filed at 07/31/2022 1921 Gross per 24 hour  Intake 150 ml  Output --  Net 150 ml   Filed Weights   07/31/22 1321 08/01/22 0410  Weight: 83.6 kg 84 kg    Telemetry    Sinus rhythm, rate in the 70's - Personally Reviewed  ECG    Sinus rhythm, 1st degree AV block, LAD - Personally Reviewed  Physical Exam   GEN: No acute distress.   Neck: No JVD Cardiac: RRR, no murmurs, rubs, or gallops.  Respiratory: Clear to auscultation bilaterally. GI: Soft, nontender, non-distended  MS: No edema; No deformity. Neuro:  Nonfocal  Psych: Normal affect   Labs    Chemistry Recent Labs  Lab 07/30/22 1950 07/31/22 0500  NA 139 140  K 4.6 4.2  CL 111 112*  CO2 19* 22  GLUCOSE 143* 102*  BUN 41* 32*   CREATININE 1.88* 1.72*  CALCIUM 9.0 8.9  GFRNONAA 28* 31*  ANIONGAP 9 6     Hematology Recent Labs  Lab 07/30/22 1950 07/31/22 0500 08/01/22 0128  WBC 11.6* 9.7 9.7  RBC 4.12 3.95 3.85*  HGB 12.3 11.5* 11.3*  HCT 38.6 37.2 36.0  MCV 93.7 94.2 93.5  MCH 29.9 29.1 29.4  MCHC 31.9 30.9 31.4  RDW 13.7 13.6 13.5  PLT 115* 134* 124*      Radiology    No results found.  Cardiac Studies   TTE 05/22/2021 1. Left ventricular ejection fraction, by estimation, is 60 to 65%. The  left ventricle has normal function. The left ventricle has no regional  wall motion abnormalities. Left ventricular diastolic parameters were  normal.   2. Right ventricular systolic function is normal. The right ventricular  size is normal.   3. The mitral valve is normal in structure. Mild mitral valve  regurgitation.   Zio patch 07/2022 Patient had a min HR of 24 bpm, max HR of 129 bpm, and avg HR of 63 bpm. Predominant underlying rhythm was Sinus Rhythm.  Atrial Fibrillation burden 27%, ranging from 24-129  bpm (avg of 76 bpm), the longest lasting 3 days 19 hours with an avg rate of 76 bpm.    66 Pauses occurred, the longest lasting 7.9 secs (8 bpm). Atrial Fibrillation and Pause were detected within +/- 45 seconds of symptomatic patient event(s). Isolated SVEs were rare (<1.0%), SVE Couplets were rare (<1.0%), and SVE Triplets were rare (<1.0%). Isolated VEs were rare (<1.0%), and no VE Couplets or VE Triplets were present. MD notification criteria for Symptomatic Bradycardia, Pauses and Slow Atrial Fibrillation met  Patient Profile     71 y.o. female with a history of coronary artery calcification by CT imaging, persistent Afib, DM2, HLD, CLL, CKD stage IIIb-IV, morbid obesity, and anemia is admitted for tachy-brady syndrome at the request of Dr. Marlou Porch.  Assessment & Plan    1.  Atrial fibrillation: paroxysmal, minimally symptomatic. Burden of 27% despite being on amiodarone. Rates are  well-controlled during episodes of AF with average V-rate of 76 bpm.   I am not sure how strong the indication for rhythm control is for her with minimal symptoms. Will need to monitor symptoms and rates off carvedilol/amiodarone -- if her average HR at rest is > 110 or she has symptoms, would need to pursue rhythm control. Due to renal insufficiency, the only alternative to amiodarone for rhythm control would be ablation. No plans for pacemaker at this time.    2. CLL: continue routine outpatient follow-up 3. Hypertension: BP controlled at present off of carvedilol 4. DM:  continue insulin  Disposition: home today. Will order 14 day MCOT to assess rates and AF symptoms.  For questions or updates, please contact [place] Please consult [email or web address] for contact info under Cardiology/STEMI.      Signed, Melida Quitter, MD   08/01/2022, 9:43 AM

## 2022-08-01 NOTE — Discharge Summary (Addendum)
Discharge Summary    Patient ID: Brenda [name] MRN: [id number]; DOB: May 29, 1951  Admit date: 07/30/2022 Discharge date: 08/01/2022  PCP:  [name], MD   [place] Providers Cardiologist:  Ida Rogue, MD   Discharge Diagnoses    Principal Problem:   Tachy-brady syndrome [facility]) Active Problems:   Essential hypertension   Morbid obesity ([place])   Diabetes mellitus type 2 with complications ([place])   CKD (chronic kidney disease) stage 3, GFR 30-59 ml/min ([facility])   A-fib ([place])   Sick sinus syndrome ([place])    Diagnostic Studies/Procedures    None _____________   History of Present Illness     Brenda [name] is a 71 y.o. female with history of CAD on CT scan, persistent atrial fibrillation on Eliquis, hyperlipidemia, type 2 diabetes mellitus, CKD stage IIIb-IV, CLL, anemia, and morbid obesity who was admitted on 07/30/2022 for tachybradycardia syndrome.  Patient is followed by Dr. Rockey Situ. CT cardiac scoring in 12/2015 showed a coronary calcium score of 1,031 (97th percentile for age and sex). It also showed multiple pulmonary nodules scattered throughout the lung bilaterally. Follow-up CT imaging for nodules led to a PET scan that was consistent with CLL and then confirmed by biopsy.  She was diagnosed with new onset atrial fibrillation at an office visit 03/2021 and was started on Eliquis.  She was subsequently converted to sinus rhythm on her own. Echo in 05/2021 showed LVEF of 60-65% with no regional wall motion abnormalities and normal diastolic parameters, normal RV, and mild MR.  Unfortunately, she was noted to be back in atrial fibrillation with RVR at an office visit on 04/19/2022 despite escalation of Metoprolol.  She was started on Amiodarone.  Unfortunately following initiation of Amiodarone therapy, she was noted to have heart rates in the upper 40s.  She was intermittently holding Metoprolol for low heart rates. Due to ongoing dizziness, Amiodarone was  reduced to 100 mg daily and a heart monitor was placed.  She was continued on Carvedilol 12.5 mg twice daily with hold parameters and Eliquis was continued.   Zio monitor showed 66 pauses with the longest pause lasting approximately 8 seconds.  Multiple pauses were associated with patient triggered events.  Monitor also noted 27% A-fib burden with ventricular rates ranging from 24 to 129 bpm.  Given these results, she was called at home and instructed to hold Carvedilol and Amiodarone and proceed to [facility]     Consultants: EP   Patient was admitted on 07/30/2022 for further evaluation and management of tachybrady syndrome as described above. Electrolytes and TSH were normal. Her home Coreg and Amiodarone were held. She was seen by EP and it was not felt that pacemaker implantation was necessary at this time. Plan is to have her remain off Coreg and Amiodarone and order 14 day MCOT to assess rates and atrial fibrillation symptoms. If her average heart rate at rest is >110 or she has symptoms, would need to pursue rhythm control. Due to renal insufficiency, the only alternative to Amiodarone for rhythm control would be ablation.    All other chronic medical conditions were stable this admission. Continue all other home medications.  Patient was seen and examined by Dr. Myles Gip today and was determined to be stable for discharge. Outpatient follow-up arranged medications as below.  Did the patient have an acute coronary syndrome (MI, NSTEMI, STEMI, etc) this admission?:  No  Did the patient have a percutaneous coronary intervention (stent / angioplasty)?:  No.    _____________  Discharge Vitals Blood pressure (!) 143/62, pulse 63, temperature 98 F (36.7 C), temperature source Oral, resp. rate 15, height 5' (1.524 m), weight 84 kg, SpO2 93 %.  Filed Weights   07/31/22 1321 08/01/22 0410  Weight: 83.6 kg 84 kg    Labs & Radiologic  Studies    CBC Recent Labs    07/31/22 0500 08/01/22 0128  WBC 9.7 9.7  HGB 11.5* 11.3*  HCT 37.2 36.0  MCV 94.2 93.5  PLT 134* 161*   Basic Metabolic Panel Recent Labs    07/30/22 1950 07/31/22 0500  NA 139 140  K 4.6 4.2  CL 111 112*  CO2 19* 22  GLUCOSE 143* 102*  BUN 41* 32*  CREATININE 1.88* 1.72*  CALCIUM 9.0 8.9  MG 1.9  --    Liver Function Tests No results for input(s): "AST", "ALT", "ALKPHOS", "BILITOT", "PROT", "ALBUMIN" in the last 72 hours. No results for input(s): "LIPASE", "AMYLASE" in the last 72 hours. High Sensitivity Troponin:   No results for input(s): "TROPONINIHS" in the last 720 hours.  BNP Invalid input(s): "POCBNP" D-Dimer No results for input(s): "DDIMER" in the last 72 hours. Hemoglobin A1C No results for input(s): "HGBA1C" in the last 72 hours. Fasting Lipid Panel Recent Labs    07/31/22 0500  CHOL 104  HDL 21*  LDLCALC 44  TRIG 193*  CHOLHDL 5.0   Thyroid Function Tests Recent Labs    07/30/22 1950  TSH 11.721*   _____________  No results found. Disposition   Patient is being discharged home today in good condition.  Follow-up Plans & Appointments     Follow-up Information     Rise Mu, PA-C Follow up.   Specialties: Physician Assistant, Cardiology, Radiology Why: Please keep appointment with General Cardiology on 08/13/2022 at 10:55am. Contact information: [place] [address] [phone]         Mealor, Yetta Barre, MD Follow up.   Specialty: Cardiology Why: Follow-up with our Electrophysiology Team scheduled for 08/30/2022 at 10:30am at our [facility] office. Please arrive 15 minutes early for check-in. If this date/time does not work for you, please call our office to reschedule. Contact information: [address] [place] [address] [phone]                Discharge Instructions     Diet - low sodium heart healthy   Complete by: As  directed    Increase activity slowly   Complete by: As directed        Discharge Medications   Allergies as of 08/01/2022       Reactions   Gadolinium Derivatives Other (See Comments)   Nsaids Other (See Comments)   Due to kidney function   Amlodipine    Swelling-  leg swell   Contrast Media [iodinated Contrast Media] Other (See Comments)   Patient states unable to take / use due to kidney function        Medication List     STOP taking these medications    amiodarone 200 MG tablet Commonly known as: PACERONE   carvedilol 12.5 MG tablet Commonly known as: COREG       TAKE these medications    acetaminophen 500 MG tablet Commonly known as: TYLENOL Take 500-1,000 mg by mouth every 6 (six) hours as needed for mild pain, moderate pain  or fever.   ALPRAZolam 1 MG tablet Commonly known as: XANAX TAKE 1/2 TO 1 TABLET(0.5 TO 1 MG) BY MOUTH TWICE DAILY AS NEEDED FOR ANXIETY What changed: See the new instructions.   B-12 1000 MCG Subl Place 1,000 mcg under the tongue every evening.   BD Pen Needle Nano 2nd Gen 32G X 4 MM Misc Generic drug: Insulin Pen Needle USE DAILY AS DIRECTED   buPROPion 300 MG 24 hr tablet Commonly known as: WELLBUTRIN XL Take 1 tablet (300 mg total) by mouth daily.   doxazosin 2 MG tablet Commonly known as: CARDURA Take 1 tablet (2 mg total) by mouth daily after lunch.   Eliquis 5 MG Tabs tablet Generic drug: apixaban TAKE 1 TABLET(5 MG) BY MOUTH TWICE DAILY What changed: See the new instructions.   esomeprazole 20 MG capsule Commonly known as: NEXIUM Take 20 mg by mouth every Monday, Wednesday, and Friday.   ezetimibe 10 MG tablet Commonly known as: ZETIA Take 1 tablet (10 mg total) by mouth daily.   iron polysaccharides 150 MG capsule Commonly known as: NIFEREX Take 150 mg by mouth at bedtime.   Jardiance 10 MG Tabs tablet Generic drug: empagliflozin TAKE 1 TABLET(10 MG) BY MOUTH DAILY BEFORE BREAKFAST What changed: See  the new instructions.   Lancing Device Misc Use as advised - Freestyle   Lantus SoloStar 100 UNIT/ML Solostar Pen Generic drug: insulin glargine Inject 18-20 Units into the skin daily.   lisinopril 20 MG tablet Commonly known as: ZESTRIL Take 1 tablet (20 mg total) by mouth 2 (two) times daily.   NONFORMULARY OR COMPOUNDED ITEM Apply 1 application topically 2 (two) times daily as needed (sun damage on face). FLUOROURACIL 5% + CALCIPOTRIENE 8.185%   OneTouch Delica Plus [id number] Misc CHECK BLOOD SUGAR THREE TIMES DAILY AS DIRECTED   OneTouch Verio test strip Generic drug: glucose blood TEST BLOOD SUGAR THREE TIMES DAILY AS DIRECTED   OneTouch Verio w/Device Kit Use as advised   simvastatin 40 MG tablet Commonly known as: ZOCOR TAKE 1 TABLET(40 MG) BY MOUTH AT BEDTIME What changed:  how much to take how to take this when to take this additional instructions   triamcinolone cream 0.1 % Commonly known as: KENALOG Apply 1 application topically 2 (two) times daily as needed (skin rash.).   Vitamin D3 50 MCG (2000 UT) Tabs Take 2,000 mcg by mouth daily.           Outstanding Labs/Studies   N/A  Duration of Discharge Encounter   Greater than 30 minutes including physician time.  Signed, Darreld Mclean, PA-C 08/01/2022, 11:05 AM

## 2022-08-02 ENCOUNTER — Other Ambulatory Visit (INDEPENDENT_AMBULATORY_CARE_PROVIDER_SITE_OTHER): Payer: Medicare Other

## 2022-08-02 DIAGNOSIS — I4811 Longstanding persistent atrial fibrillation: Secondary | ICD-10-CM

## 2022-08-02 DIAGNOSIS — I495 Sick sinus syndrome: Secondary | ICD-10-CM

## 2022-08-02 NOTE — Progress Notes (Unsigned)
Enrolled for [employer] to mail a ZIO AT Live Telemetry monitor to patients address on file.   Dr. Myles Gip to read.

## 2022-08-05 DIAGNOSIS — I4811 Longstanding persistent atrial fibrillation: Secondary | ICD-10-CM | POA: Diagnosis not present

## 2022-08-05 DIAGNOSIS — I495 Sick sinus syndrome: Secondary | ICD-10-CM

## 2022-08-06 DIAGNOSIS — I4811 Longstanding persistent atrial fibrillation: Secondary | ICD-10-CM | POA: Diagnosis not present

## 2022-08-06 DIAGNOSIS — I495 Sick sinus syndrome: Secondary | ICD-10-CM | POA: Diagnosis not present

## 2022-08-10 NOTE — Progress Notes (Signed)
Cardiology Office Note    Date:  08/13/2022   ID:  Dessa, Ledee [birth year], MRN [id number]  PCP:  [name], MD  Cardiologist:  Ida Rogue, MD  Electrophysiologist:  None   Chief Complaint: Hospital follow-up  History of Present Illness:   Brenda [name] is a 71 y.o. female with history of coronary artery calcification by CT imaging, persistent Afib, sinus pauses, DM2, HLD, CLL, CKD stage IIIb-IV, morbid obesity, and anemia who presents for hospital follow up.   Calcium score in 12/2015 of 1031, which was the 97th percentile, with diffuse calcification in the entire LAD and RCA. Noncardiac overread also noted multiple pulmonary nodules scattered throughout the lungs bilaterally as noted below, along with a mildly enlarged right axillary lymph node with follow up imaging in 11/2017 showing stable pulmonary nodules as well as borderline to mildly enlarged axillary, subpectoral, and mediastinal lymph nodes. Subsequent PET scan noted to be consistent with CLL, confirmed by biopsy.   She was diagnosed with new onset Afib at office visit in 03/2021, and started on Eliquis, and was found to be in sinus rhythm in follow up in 04/2021. Echo from 05/2021 demonstrated an EF of 60-65%, no RWMA, normal LV diastolic function parameters, normal RV systolic function and ventricular cavity size, and mild mitral regurgitation.   She was seen in the office on 04/19/2022 and noted to be back in Afib with RVR, despite escalation of metoprolol via My Chart prior to the OV. She was started on amiodarone and continued on metoprolol and Eliquis.     She was seen in the office in 05/2022 and was without symptoms of angina or decompensation.  Following the initiation of amiodarone, the next morning she noted her heart rates were down in the upper 40s bpm.  At times, she was having to hold her metoprolol due to heart rates less than 60 bpm.  EKG showed sinus rhythm.  Blood pressure readings were  elevated at home and felt to be exacerbated by intermittent holding of metoprolol.  She was transitioned from Toprol-XL to carvedilol.  Subsequent BP readings from home remain on the higher side leading to titration of carvedilol and lisinopril.  She was last seen in the office on 07/07/2022 and was without symptoms of angina or decompensation.  She continued to note dizziness that is typically worse in the mornings approximately 1 to 2 hours after taking her morning medications.  During this timeframe, her BP was frequently on the low side ranging from the low 009Q to 33A systolic with a rare reading of 87 systolic.  Heart rates are typically in the mid to upper 50s to mid 60s bpm.  Due to dizziness, Zio patch was placed.   We received a call report from [employer] on 07/30/2022 with noted sinus pauses with the longest lasting 7.9 seconds. Given this, we recommended she go to the [facility] ED for evaluation by EP. At [facility], it was recommended she hold Coreg and amiodarone. Her TSH was also noted to be elevated at 11.721. Given these factors, implantation of pacer was deferred at that time with the patient being discharged on repeat Zio patch to evaluate for high grade AV block/prolonged pauses and to quantify Afib burden off beta blocker and amiodarone with follow up schedule with EP later this month.   She comes in doing well today, and is without symptoms of angina or decompensation.  She has not had any further significant dizziness since her hospital  discharge off carvedilol and amiodarone.  No presyncope or syncope.  No chest pain or palpitations.  No falls.  Overall, she feels like she has more energy off carvedilol and amiodarone.  She also feels like her lower extremity swelling is improved.  Home BP and heart rate readings have been stable.  Review of current Zio patch data, through the [place], shows no further sinus pauses, evidence of high-grade AV block, or significant A-fib burden.   Labs  independently reviewed: 08/2022 - Hgb 11.3, PLT 124 07/2022 - TC 104, TG 193, HDL 21, LDL 44, potassium 4.2, BUN 32, serum creatinine 1.72, TSH 11.721, magnesium 1.9, A1c 6.3 06/2022 - albumin 4.4, AST/ALT not elevated 04/2022 - TSH normal   Past Medical History:  Diagnosis Date   Anxiety    Arthritis    Atrial fibrillation (HCC)    CLL (chronic lymphocytic leukemia) ([place])    Dx. 2019 Dr. 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 Particia Nearing first visit 11-2019    Past Surgical History:  Procedure Laterality Date   ABDOMINAL HYSTERECTOMY  1998   COLONOSCOPY     EYE SURGERY Bilateral    cataracts   EYE SURGERY Left    retina tear   FEMUR IM NAIL  10/25/2012   Procedure: INTRAMEDULLARY (IM) NAIL FEMORAL;  Surgeon: Mauri Pole, MD;  Location: [facility];  Service: Orthopedics;  Laterality: Left;   HIP SURGERY     Fracture L IM nail and 3 rods   left knee arthroscopy  12/2010   LYMPH NODE BIOPSY     axillary left 12-2018   REFRACTIVE SURGERY     skin cancer removed right and and lower forearm     squamoun in situ   TOTAL KNEE ARTHROPLASTY  12/20/2011   Procedure: TOTAL KNEE ARTHROPLASTY;  Surgeon: Gearlean Alf, MD;  Location: [facility];   Service: Orthopedics;  Laterality: Left;  Failed attempt at spinal    TOTAL SHOULDER ARTHROPLASTY Right 08/19/2016   Procedure: RIGHT TOTAL SHOULDER ARTHROPLASTY;  Surgeon: Justice Britain, MD;  Location: [facility];  Service: Orthopedics;  Laterality: Right;   TOTAL SHOULDER ARTHROPLASTY Left 01/10/2020   Procedure: TOTAL SHOULDER ARTHROPLASTY;  Surgeon: Justice Britain, MD;  Location: [facility];  Service: Orthopedics;  Laterality: Left;  160mn    Current Medications: Current Meds  Medication Sig   acetaminophen (TYLENOL) 500 MG tablet Take 500-1,000 mg by mouth every 6 (six) hours as needed for mild pain, moderate pain or fever.    ALPRAZolam (XANAX) 1 MG  tablet TAKE 1/2 TO 1 TABLET(0.5 TO 1 MG) BY MOUTH TWICE DAILY AS NEEDED FOR ANXIETY (Patient taking differently: Take 1 mg by mouth 2 (two) times daily.)   BD PEN NEEDLE NANO 2ND GEN 32G X 4 MM MISC USE DAILY AS DIRECTED   Blood Glucose Monitoring Suppl (ONETOUCH VERIO) w/Device KIT Use as advised   buPROPion (WELLBUTRIN XL) 300 MG 24 hr tablet Take 1 tablet (300 mg total) by mouth daily.   Cholecalciferol (VITAMIN D3) 2000 units TABS Take 2,000 mcg by mouth daily.   Cyanocobalamin (B-12) 1000 MCG SUBL Place 1,000 mcg under the tongue every evening.    doxazosin (CARDURA) 2 MG tablet Take 1 tablet (2 mg total) by mouth daily after lunch.   ELIQUIS 5 MG TABS tablet TAKE 1 TABLET(5 MG) BY MOUTH TWICE DAILY (Patient taking differently: Take 5 mg by mouth 2 (two) times daily.)   esomeprazole (NEXIUM) 20 MG capsule Take 20 mg by mouth every Monday, Wednesday, and Friday.   ezetimibe (ZETIA) 10 MG tablet Take 1 tablet (10 mg total) by mouth daily.   glucose blood (ONETOUCH VERIO) test strip TEST BLOOD SUGAR THREE TIMES DAILY AS DIRECTED   insulin glargine (LANTUS SOLOSTAR) 100 UNIT/ML Solostar Pen Inject 18-20 Units into the skin daily.   iron polysaccharides (NIFEREX) 150 MG capsule Take 150 mg by mouth at bedtime.    JARDIANCE 10 MG TABS tablet TAKE 1  TABLET(10 MG) BY MOUTH DAILY BEFORE BREAKFAST (Patient taking differently: Take 10 mg by mouth daily.)   Lancet Devices (LANCING DEVICE) MISC Use as advised - Freestyle   Lancets (ONETOUCH DELICA PLUS HWTUUE28M) MISC CHECK BLOOD SUGAR THREE TIMES DAILY AS DIRECTED   lisinopril (ZESTRIL) 20 MG tablet Take 1 tablet (20 mg total) by mouth 2 (two) times daily.   NONFORMULARY OR COMPOUNDED ITEM Apply 1 application topically 2 (two) times daily as needed (sun damage on face). FLUOROURACIL 5% + CALCIPOTRIENE 0.005%   simvastatin (ZOCOR) 40 MG tablet TAKE 1 TABLET(40 MG) BY MOUTH AT BEDTIME (Patient taking differently: Take 40 mg by mouth at bedtime.)   triamcinolone cream (KENALOG) 0.1 % Apply 1 application topically 2 (two) times daily as needed (skin rash.).     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    Employer: [employer]  Tobacco Use   Smoking status: Never   Smokeless tobacco: Never  Vaping Use   Vaping Use: Never used  Substance and Sexual Activity   Alcohol use: No   Drug use: No   Sexual activity: Not Currently  Other Topics Concern   Not on file  Social History Narrative   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            Social Determinants of Health   Financial Resource Strain: Low Risk  (03/10/2022)   Overall Financial Resource Strain (CARDIA)    Difficulty of Paying Living Expenses: Not hard at all  Food Insecurity: No Food Insecurity (06/11/2022)   Hunger Vital Sign    Worried About Running Out of Food in the Last Year: Never true    [place] in the  Last Year: Never true  Transportation Needs: No Transportation Needs (06/11/2022)   PRAPARE -  Hydrologist (Medical): No    Lack of Transportation (Non-Medical): No  Physical Activity: Inactive (03/10/2022)   Exercise Vital Sign    Days of Exercise per Week: 0 days    Minutes of Exercise per Session: 0 min  Stress: No Stress Concern Present (03/10/2022)   [place]    Feeling of Stress : Not at all  Social Connections: Moderately Isolated (03/10/2022)   Social Connection and Isolation Panel [NHANES]    Frequency of Communication with Friends and Family: More than three times a week    Frequency of Social Gatherings with Friends and Family: More than three times a week    Attends Religious Services: More than 4 times per year    Active Member of [employer] or Organizations: No    Attends Archivist Meetings: Never    Marital Status: Never married     Family History:  The patient's family history includes Cancer in her father and mother. There is no history of Breast cancer.  ROS:   12-point review of systems is negative unless otherwise noted in the HPI.   EKGs/Labs/Other Studies Reviewed:    Studies reviewed were summarized above. The additional studies were reviewed today:  2D echo 05/22/2021: 1. Left ventricular ejection fraction, by estimation, is 60 to 65%. The  left ventricle has normal function. The left ventricle has no regional  wall motion abnormalities. Left ventricular diastolic parameters were  normal.   2. Right ventricular systolic function is normal. The right ventricular  size is normal.   3. The mitral valve is normal in structure. Mild mitral valve  regurgitation. __________   Calcium score 01/13/2016:   Noncardiac over read: 1. There multiple pulmonary nodules scattered throughout the lungs bilaterally, the largest of which measures up to 9 mm in the left lower lobe. Non-contrast chest CT at 3-6 months is recommended. If the nodules are stable at  time of repeat CT, then future CT at 18-24 months (from today's scan) is considered optional for low-risk patients, but is recommended for high-risk patients. This recommendation follows the consensus statement: Guidelines for Management of Incidental Pulmonary Nodules Detected on CT Images:From the Fleischner Society 2017; published online before print (10.1148/radiol.[id number]). 2. Mildly enlarged right axillary lymph node measuring 1 cm in short axis. This is nonspecific, but correlation with mammography is suggested in the near future. If mammography is abnormal, further evaluation with contrast enhanced CT of the chest, abdomen and pelvis could be considered to evaluate for metastatic disease. These results will be called to the ordering clinician or representative by the Radiologist Assistant, and communication documented in the PACS or zVision Dashboard.   Cardiac overread: IMPRESSION: Coronary calcium score of 1031. This was 97th percentile for age and sex matched control. Consider f/u perfusion study to further assess CAD See radiology report regarding right axillary lymph node and pulmonary nodules   EKG:  EKG is ordered today.  The EKG ordered today demonstrates NSR, 75 bpm, left anterior fascicular block, poor R wave progression along the precordial leads, no acute ST-T changes, consistent with prior tracing  Recent Labs: 06/16/2022: ALT 10 07/30/2022: Magnesium 1.9; TSH 11.721 07/31/2022: BUN 32; Creatinine, Ser 1.72; Potassium 4.2; Sodium 140 08/01/2022: Hemoglobin 11.3; Platelets 124  Recent Lipid Panel    Component Value Date/Time   CHOL 104 07/31/2022 0500  CHOL 124 07/20/2021 0915   TRIG 193 (H) 07/31/2022 0500   HDL 21 (L) 07/31/2022 0500   HDL 29 (L) 07/20/2021 0915   CHOLHDL 5.0 07/31/2022 0500   VLDL 39 07/31/2022 0500   LDLCALC 44 07/31/2022 0500   LDLCALC 73 07/20/2021 0915   LDLDIRECT 55.0 05/15/2020 0827    PHYSICAL EXAM:    VS:  BP 130/70 (BP  Location: Right Arm, Patient Position: Sitting, Cuff Size: Normal)   Pulse 75   Ht 5' (1.524 m)   Wt 184 lb 9.6 oz (83.7 kg)   SpO2 98%   BMI 36.05 kg/m   BMI: Body mass index is 36.05 kg/m.  Physical Exam Vitals reviewed.  Constitutional:      Appearance: She is well-developed.  HENT:     Head: Normocephalic and atraumatic.  Eyes:     General:        Right eye: No discharge.        Left eye: No discharge.  Neck:     Vascular: No JVD.  Cardiovascular:     Rate and Rhythm: Normal rate and regular rhythm.     Pulses:          Posterior tibial pulses are 2+ on the right side and 2+ on the left side.     Heart sounds: Normal heart sounds, S1 normal and S2 normal. Heart sounds not distant. No midsystolic click and no opening snap. No murmur heard.    No friction rub.  Pulmonary:     Effort: Pulmonary effort is normal. No respiratory distress.     Breath sounds: Normal breath sounds. No decreased breath sounds, wheezing or rales.  Chest:     Chest wall: No tenderness.  Abdominal:     General: There is no distension.  Musculoskeletal:     Cervical back: Normal range of motion.     Right lower leg: No edema.     Left lower leg: No edema.  Skin:    General: Skin is warm and dry.     Nails: There is no clubbing.  Neurological:     Mental Status: She is alert and oriented to person, place, and time.  Psychiatric:        Speech: Speech normal.        Behavior: Behavior normal.        Thought Content: Thought content normal.        Judgment: Judgment normal.     Wt Readings from Last 3 Encounters:  08/13/22 184 lb 9.6 oz (83.7 kg)  08/01/22 185 lb 3 oz (84 kg)  07/27/22 189 lb 6.4 oz (85.9 kg)     ASSESSMENT & PLAN:   Persistent Afib: Maintaining sinus rhythm, with no evidence of significant A-fib burden noted on a repeat outpatient cardiac monitoring.  No longer on AV nodal medications or antiarrhythmics secondary to bradycardia and sinus pauses.  Should she have an  increased burden of A-fib with RVR with ventricular rates greater than 110 bpm sustained, or becomes symptomatic with A-fib, EP would consider A-fib ablation.  CHA2DS2-VASc at least 5.  She remains on apixaban 5 mg twice daily and does not meet reduced dosing criteria.  Recent renal function, electrolytes, and blood count stable.  No symptoms concerning for bleeding.  No falls.  Sinus pauses: Exacerbated by beta-blocker and amiodarone.  Current Zio patch appears to show resolution of sinus pauses off these medications.  No current indication for PPM implantation.  Follow-up with the EP as  directed later this month.  Continue to avoid AV nodal blocking medications.  Coronary artery calcification/HLD: No symptoms concerning for angina.  She remains on apixaban advised of aspirin to minimize bleeding risk, given A-fib.  LDL 73.  She remains on simvastatin and ezetimibe.  HTN: Blood pressure is reasonably controlled in the office today and well controlled at home.  Episodes of dizziness have improved following discontinuation of beta-blocker.  She remains on Cardura and lisinopril.  CKD stage IIIb-IV: Stable.  Followed by nephrology.  Avoid nephrotoxic agents.  Abnormal TSH: Possibly in the context of amiodarone usage, which has been discontinued.  Recheck TSH in 6 weeks.    Disposition: F/u with Dr. Rockey Situ or an APP in 8 weeks.   Medication Adjustments/Labs and Tests Ordered: Current medicines are reviewed at length with the patient today.  Concerns regarding medicines are outlined above. Medication changes, Labs and Tests ordered today are summarized above and listed in the Patient Instructions accessible in Encounters.   Signed, Christell Faith, PA-C 08/13/2022 12:30 PM     [address] [place], [place] [address] [phone]

## 2022-08-13 ENCOUNTER — Ambulatory Visit: Payer: Medicare Other | Attending: Physician Assistant | Admitting: Physician Assistant

## 2022-08-13 ENCOUNTER — Encounter: Payer: Self-pay | Admitting: Physician Assistant

## 2022-08-13 VITALS — BP 130/70 | HR 75 | Ht 60.0 in | Wt 184.6 lb

## 2022-08-13 DIAGNOSIS — R7989 Other specified abnormal findings of blood chemistry: Secondary | ICD-10-CM | POA: Diagnosis not present

## 2022-08-13 DIAGNOSIS — I2510 Atherosclerotic heart disease of native coronary artery without angina pectoris: Secondary | ICD-10-CM | POA: Diagnosis not present

## 2022-08-13 DIAGNOSIS — I4819 Other persistent atrial fibrillation: Secondary | ICD-10-CM | POA: Insufficient documentation

## 2022-08-13 DIAGNOSIS — E785 Hyperlipidemia, unspecified: Secondary | ICD-10-CM | POA: Diagnosis not present

## 2022-08-13 DIAGNOSIS — N1832 Chronic kidney disease, stage 3b: Secondary | ICD-10-CM | POA: Diagnosis not present

## 2022-08-13 DIAGNOSIS — I2584 Coronary atherosclerosis due to calcified coronary lesion: Secondary | ICD-10-CM | POA: Diagnosis not present

## 2022-08-13 DIAGNOSIS — R42 Dizziness and giddiness: Secondary | ICD-10-CM | POA: Insufficient documentation

## 2022-08-13 DIAGNOSIS — I455 Other specified heart block: Secondary | ICD-10-CM | POA: Diagnosis not present

## 2022-08-13 DIAGNOSIS — I495 Sick sinus syndrome: Secondary | ICD-10-CM

## 2022-08-13 NOTE — Patient Instructions (Addendum)
Medication Instructions:  No changes at this time.   *If you need a refill on your cardiac medications before your next appointment, please call your pharmacy*  Lab Work: TSH in 6 weeks over at the [facility] entrance and check in at registration.   If you have labs (blood work) drawn today and your tests are completely normal, you will receive your results only by: [place] (if you have MyChart) OR A paper copy in the mail If you have any lab test that is abnormal or we need to change your treatment, we will call you to review the results.  Testing/Procedures: None  Follow-Up: At [facility], you and your health needs are our priority.  As part of our continuing mission to provide you with exceptional heart care, we have created designated Provider Care Teams.  These Care Teams include your primary Cardiologist (physician) and Advanced Practice Providers (APPs -  Physician Assistants and Nurse Practitioners) who all work together to provide you with the care you need, when you need it.  Your next appointment:   8 week(s)  The format for your next appointment:   In Person  Provider:   Ida Rogue, MD or Christell Faith, PA-C    Other Instructions Keep scheduled appointment with Dr. Myles Gip on 08/30/22 at 10:45 am  Important Information About [employer]

## 2022-08-24 ENCOUNTER — Telehealth: Payer: Self-pay

## 2022-08-24 NOTE — Patient Outreach (Signed)
  Care Coordination TOC Note Transition Care Management Follow-up Telephone Call Date of discharge and from where: 08/01/22-[facility] How have you been since you were released from the hospital? Patient voices that she "feels much better and doing great." She has completed 14-day heart monitor wearing and was told her heart rate was normal and no issues detected. Any questions or concerns? No  Items Reviewed: Did the pt receive and understand the discharge instructions provided? Yes  Medications obtained and verified? Yes  Other? Yes  Any new allergies since your discharge? Yes  Dietary orders reviewed? Yes Do you have support at home? Yes   Home Care and Equipment/Supplies: Were home health services ordered? no If so, what is the name of the agency? N/A  Has the agency set up a time to come to the patient's home? not applicable Were any new equipment or medical supplies ordered?  No What is the name of the medical supply agency? N/A Were you able to get the supplies/equipment? not applicable Do you have any questions related to the use of the equipment or supplies? No  Functional Questionnaire: (I = Independent and D = Dependent) ADLs: I  Bathing/Dressing- I  Meal Prep- I  Eating- I  Maintaining continence- I  Transferring/Ambulation- I  Managing Meds- I  Follow up appointments reviewed:  PCP Hospital f/u appt confirmed?  Patient states she already had PCP appt for 10/15/22 and was told it was okay to wit to see MD until then  . [facility] f[facility]u appt confirmed? Yes  Saw Ryan Dunn,PA on 08/13/22. Are transportation arrangements needed? No  If their condition worsens, is the pt aware to call PCP or go to the Emergency Dept.? Yes Was the patient provided with contact information for the PCP's office or ED? Yes Was to pt encouraged to call back with questions or concerns? Yes  SDOH assessments and interventions completed:   Yes  Care Coordination Interventions  Activated:  Yes   Care Coordination Interventions:  Education provided    Encounter Outcome:  Pt. Visit Completed    Enzo Montgomery, RN,BSN,CCM [facility] Telephonic Care Management Coordinator Direct Phone: [phone] Toll Free: [phone] Fax: [phone]

## 2022-08-30 ENCOUNTER — Other Ambulatory Visit: Payer: Self-pay | Admitting: *Deleted

## 2022-08-30 ENCOUNTER — Encounter: Payer: Self-pay | Admitting: Cardiovascular Disease

## 2022-08-30 ENCOUNTER — Ambulatory Visit: Payer: Medicare Other | Attending: Cardiovascular Disease | Admitting: Cardiovascular Disease

## 2022-08-30 VITALS — BP 128/62 | HR 87 | Ht 60.0 in | Wt 183.0 lb

## 2022-08-30 DIAGNOSIS — I4819 Other persistent atrial fibrillation: Secondary | ICD-10-CM | POA: Insufficient documentation

## 2022-08-30 DIAGNOSIS — I495 Sick sinus syndrome: Secondary | ICD-10-CM | POA: Diagnosis not present

## 2022-08-30 MED ORDER — APIXABAN 5 MG PO TABS: 5.0000 mg | ORAL_TABLET | Freq: Two times a day (BID) | ORAL | 5 refills | Status: DC

## 2022-08-30 NOTE — Progress Notes (Signed)
Cardiology Office Note:    Date:  08/30/2022   ID:  Brenda [name], Brenda [name] 1951-01-01, MRN [id number]  PCP:  [name], MD   [place] Providers Cardiologist:  Ida Rogue, MD Electrophysiologist:  Melida Quitter, MD     Referring MD: [name], *    History of Present Illness:    Brenda [name] is a 71 y.o. female with a hx listed below pertinent for CAD, DM, HTN, persistent AF, CKD IIIb-IV referred for arrhythmia management.  She was diagnosed with atrial fibrillation in May 2022 and placed on metoprolol and eliquis.  She was started on amiodarone in June 2023.  In September 2023, she complained of lightheadedness and dizziness.  A Zio patch was placed that showed sinus pauses up to about 8 seconds, so her Coreg and amiodarone were discontinued.  Additionally, her TSH was elevated at 11.7.  Pacemaker placement was deferred with a reversible cause.  Monitor placed and follow-up did not show any atrial fibrillation or significant bradycardia.  She has had sinus bradycardia with metoprolol years ago.  She reports that she has been doing very well since wearing the monitor.  She records her heart rate 4 times a day, and these have been running between 70 and 90 bpm.  She notes that when she did have atrial fibrillation, her primary symptom was fatigue.  She did not have significant palpitations.  She could appreciate a regular rhythm during pulse checks.  She is a retired [employer] scientist.  Past Medical History:  Diagnosis Date   Anxiety    Arthritis    Atrial fibrillation (HCC)    CLL (chronic lymphocytic leukemia) ([place])    Dx. 2019 Dr. 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 Particia Nearing first visit 11-2019    Past Surgical History:  Procedure Laterality Date   ABDOMINAL HYSTERECTOMY  1998   COLONOSCOPY     EYE SURGERY Bilateral    cataracts   EYE SURGERY Left    retina tear   FEMUR IM NAIL  10/25/2012   Procedure: INTRAMEDULLARY (IM) NAIL FEMORAL;  Surgeon: Mauri Pole, MD;  Location: [facility];  Service: Orthopedics;  Laterality: Left;   HIP SURGERY     Fracture L IM nail and 3 rods   left knee arthroscopy  12/2010   LYMPH NODE BIOPSY     axillary left 12-2018   REFRACTIVE SURGERY     skin cancer removed right and and lower forearm     squamoun in situ  TOTAL KNEE ARTHROPLASTY  12/20/2011   Procedure: TOTAL KNEE ARTHROPLASTY;  Surgeon: Gearlean Alf, MD;  Location: [facility];  Service: Orthopedics;  Laterality: Left;  Failed attempt at spinal    TOTAL SHOULDER ARTHROPLASTY Right 08/19/2016   Procedure: RIGHT TOTAL SHOULDER ARTHROPLASTY;  Surgeon: Justice Britain, MD;  Location: [facility];  Service: Orthopedics;  Laterality: Right;   TOTAL SHOULDER ARTHROPLASTY Left 01/10/2020   Procedure: TOTAL SHOULDER ARTHROPLASTY;  Surgeon: Justice Britain, MD;  Location: [facility];  Service: Orthopedics;  Laterality: Left;  141mn    Current Medications: Current Meds  Medication Sig   acetaminophen (TYLENOL) 500 MG tablet Take 500-1,000 mg by mouth every 6 (six) hours as needed for mild pain, moderate pain or fever.    ALPRAZolam (XANAX) 1 MG tablet TAKE 1/2 TO 1 TABLET(0.5 TO 1 MG) BY MOUTH TWICE  DAILY AS NEEDED FOR ANXIETY (Patient taking differently: Take 1 mg by mouth 2 (two) times daily.)   BD PEN NEEDLE NANO 2ND GEN 32G X 4 MM MISC USE DAILY AS DIRECTED   Blood Glucose Monitoring Suppl (ONETOUCH VERIO) w/Device KIT Use as advised   buPROPion (WELLBUTRIN XL) 300 MG 24 hr tablet Take 1 tablet (300 mg total) by mouth daily.   Cholecalciferol (VITAMIN D3) 2000 units TABS Take 2,000 mcg by mouth daily.   Cyanocobalamin (B-12) 1000 MCG SUBL Place 1,000 mcg under the tongue every evening.    doxazosin (CARDURA) 2 MG tablet Take 1 tablet (2 mg total) by mouth daily after lunch.   ELIQUIS 5 MG TABS tablet TAKE 1 TABLET(5 MG) BY MOUTH TWICE DAILY (Patient taking differently: Take 5 mg by mouth 2 (two) times daily.)   esomeprazole (NEXIUM) 20 MG capsule Take 20 mg by mouth every Monday, Wednesday, and Friday.   ezetimibe (ZETIA) 10 MG tablet Take 1 tablet (10 mg total) by mouth daily.   glucose blood (ONETOUCH VERIO) test strip TEST BLOOD SUGAR THREE TIMES DAILY AS DIRECTED   insulin glargine (LANTUS SOLOSTAR) 100 UNIT/ML Solostar Pen Inject 18-20 Units into the skin daily.   iron polysaccharides (NIFEREX) 150 MG capsule Take 150 mg by mouth at bedtime.    JARDIANCE 10 MG TABS tablet TAKE 1 TABLET(10 MG) BY MOUTH DAILY BEFORE BREAKFAST (Patient taking differently: Take 10 mg by mouth daily.)   Lancet Devices (LANCING DEVICE) MISC Use as advised - Freestyle   Lancets (ONETOUCH DELICA PLUS [id number] MISC CHECK BLOOD SUGAR THREE TIMES DAILY AS DIRECTED   lisinopril (ZESTRIL) 20 MG tablet Take 1 tablet (20 mg total) by mouth 2 (two) times daily.   NONFORMULARY OR COMPOUNDED ITEM Apply 1 application topically 2 (two) times daily as needed (sun damage on face). FLUOROURACIL 5% + CALCIPOTRIENE 0.005%   simvastatin (ZOCOR) 40 MG tablet TAKE 1 TABLET(40 MG) BY MOUTH AT BEDTIME (Patient taking differently: Take 40 mg by mouth at bedtime.)   triamcinolone cream (KENALOG) 0.1 % Apply 1 application  topically 2 (two) times daily as needed (skin rash.).      Allergies:   Gadolinium derivatives, Nsaids, Amlodipine, and Contrast media [iodinated contrast media]   Social History   Socioeconomic History   Marital status: Single    Spouse name: Not on file   Number of children: Not on file   Years of education: Not on file   Highest education level: Not on file  Occupational History   Occupation: RTherapist, [employer]at [employer]    Employer: [employer] Tobacco Use   Smoking  status: Never   Smokeless tobacco: Never  Vaping Use   Vaping Use: Never used  Substance and Sexual Activity   Alcohol use: No   Drug use: No   Sexual activity: Not Currently  Other Topics Concern   Not on file  Social History Narrative   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            Social Determinants of Health   Financial Resource Strain: Low Risk  (03/10/2022)   Overall Financial Resource Strain (CARDIA)    Difficulty of Paying Living Expenses: Not hard at all  Food Insecurity: No Food Insecurity (08/24/2022)   Hunger Vital Sign    Worried About Running Out of Food in the Last Year: Never true    Ran Out of Food in the Last Year: Never true  Transportation Needs: No Transportation Needs (08/24/2022)   PRAPARE - Hydrologist (Medical): No    Lack of Transportation (Non-Medical): No  Physical Activity: Inactive (03/10/2022)   Exercise Vital Sign    Days of Exercise per Week: 0 days    Minutes of Exercise per Session: 0 min  Stress: No Stress Concern Present (03/10/2022)   [place]    Feeling of Stress : Not at all  Social Connections: Moderately Isolated (03/10/2022)   Social Connection and Isolation Panel [NHANES]    Frequency of Communication with Friends and Family: More than three times a week    Frequency of Social Gatherings with Friends and Family: More than three times a week    Attends Religious Services: More  than 4 times per year    Active Member of [employer] or Organizations: No    Attends Archivist Meetings: Never    Marital Status: Never married     Family History: The patient's family history includes Cancer in her father and mother. There is no history of Breast cancer.  ROS:   Please see the history of present illness.    All other systems reviewed and are negative.  EKGs/Labs/Other Studies Reviewed:     EKG:  Last EKG results: Sinus rhythm 84 bpm   Recent Labs: 06/16/2022: ALT 10 07/30/2022: Magnesium 1.9; TSH 11.721 07/31/2022: BUN 32; Creatinine, Ser 1.72; Potassium 4.2; Sodium 140 08/01/2022: Hemoglobin 11.3; Platelets 124       Physical Exam:    VS:  There were no vitals taken for this visit.    Wt Readings from Last 3 Encounters:  08/13/22 184 lb 9.6 oz (83.7 kg)  08/01/22 185 lb 3 oz (84 kg)  07/27/22 189 lb 6.4 oz (85.9 kg)     GEN:  Well nourished, well developed in no acute distress CARDIAC: RRR, no murmurs, rubs, gallops RESPIRATORY:  Normal work of breathing MUSCULOSKELETAL: no edema    ASSESSMENT & PLAN:    Paroxysmal atrial fibrillation: she notes fatigue when in AF but has not had recurrence in weeks-months. She is not a candidate for most AADs due to renal dysfunction. I would like for her to lose weight prior to considering an ablation procedure.  If she continues to lose weight as she has been, her A-fib may resolve on its own.  I will see her again in 6 months. Continue eliquis. Obesity: I reinforced her weight loss efforts. DM II CKD IV: precludes use of most AADs.          Medication Adjustments/Labs and Tests Ordered: Current medicines are reviewed at length  with the patient today.  Concerns regarding medicines are outlined above.  No orders of the defined types were placed in this encounter.  No orders of the defined types were placed in this encounter.    Signed, Melida Quitter, MD  08/30/2022 10:30 AM    [place]

## 2022-08-30 NOTE — Telephone Encounter (Signed)
Eliquis 94m refill request received. Patient is 71years old, weight-83kg, Crea-1.72 on 07/31/2022, Diagnosis-Afib, and last seen by Dr. MMyles Gipon today, 08/03/22. Dose is appropriate based on dosing criteria. Will send in refill to requested pharmacy.

## 2022-08-30 NOTE — Patient Instructions (Signed)
Medication Instructions:  Your physician recommends that you continue on your current medications as directed. Please refer to the Current Medication list given to you today.  *If you need a refill on your cardiac medications before your next appointment, please call your pharmacy*   Follow-Up: At [facility], you and your health needs are our priority.  As part of our continuing mission to provide you with exceptional heart care, we have created designated Provider Care Teams.  These Care Teams include your primary Cardiologist (physician) and Advanced Practice Providers (APPs -  Physician Assistants and Nurse Practitioners) who all work together to provide you with the care you need, when you need it.   Your next appointment:   6 month(s)  The format for your next appointment:   In Person  Provider:   You may see Melida Quitter, MD or one of the following Advanced Practice Providers on your designated Care Team:   Tommye Standard, [place] Legrand Como "Jonni Sanger" Chalmers Cater, [place]    Important Information About Sugar

## 2022-09-09 ENCOUNTER — Other Ambulatory Visit: Payer: Self-pay | Admitting: Internal Medicine

## 2022-09-09 DIAGNOSIS — Z1231 Encounter for screening mammogram for malignant neoplasm of breast: Secondary | ICD-10-CM

## 2022-09-11 ENCOUNTER — Other Ambulatory Visit: Payer: Self-pay | Admitting: Internal Medicine

## 2022-09-13 ENCOUNTER — Encounter: Payer: Self-pay | Admitting: Internal Medicine

## 2022-09-14 ENCOUNTER — Other Ambulatory Visit: Payer: Self-pay | Admitting: Internal Medicine

## 2022-09-16 DIAGNOSIS — L578 Other skin changes due to chronic exposure to nonionizing radiation: Secondary | ICD-10-CM | POA: Diagnosis not present

## 2022-09-16 DIAGNOSIS — L814 Other melanin hyperpigmentation: Secondary | ICD-10-CM | POA: Diagnosis not present

## 2022-09-16 DIAGNOSIS — D225 Melanocytic nevi of trunk: Secondary | ICD-10-CM | POA: Diagnosis not present

## 2022-09-16 DIAGNOSIS — L820 Inflamed seborrheic keratosis: Secondary | ICD-10-CM | POA: Diagnosis not present

## 2022-09-16 DIAGNOSIS — D1801 Hemangioma of skin and subcutaneous tissue: Secondary | ICD-10-CM | POA: Diagnosis not present

## 2022-09-16 DIAGNOSIS — D2271 Melanocytic nevi of right lower limb, including hip: Secondary | ICD-10-CM | POA: Diagnosis not present

## 2022-09-16 DIAGNOSIS — L821 Other seborrheic keratosis: Secondary | ICD-10-CM | POA: Diagnosis not present

## 2022-09-16 DIAGNOSIS — Z85828 Personal history of other malignant neoplasm of skin: Secondary | ICD-10-CM | POA: Diagnosis not present

## 2022-09-16 DIAGNOSIS — L570 Actinic keratosis: Secondary | ICD-10-CM | POA: Diagnosis not present

## 2022-09-21 DIAGNOSIS — I129 Hypertensive chronic kidney disease with stage 1 through stage 4 chronic kidney disease, or unspecified chronic kidney disease: Secondary | ICD-10-CM | POA: Diagnosis not present

## 2022-09-21 DIAGNOSIS — N1832 Chronic kidney disease, stage 3b: Secondary | ICD-10-CM | POA: Diagnosis not present

## 2022-09-21 DIAGNOSIS — E785 Hyperlipidemia, unspecified: Secondary | ICD-10-CM | POA: Diagnosis not present

## 2022-09-21 DIAGNOSIS — I4891 Unspecified atrial fibrillation: Secondary | ICD-10-CM | POA: Diagnosis not present

## 2022-09-21 DIAGNOSIS — E1122 Type 2 diabetes mellitus with diabetic chronic kidney disease: Secondary | ICD-10-CM | POA: Diagnosis not present

## 2022-09-27 ENCOUNTER — Telehealth: Payer: Self-pay

## 2022-09-27 ENCOUNTER — Other Ambulatory Visit
Admission: RE | Admit: 2022-09-27 | Discharge: 2022-09-27 | Disposition: A | Discharge status: Home / Self Care | Payer: Medicare Other | Attending: Physician Assistant | Admitting: Physician Assistant

## 2022-09-27 DIAGNOSIS — I455 Other specified heart block: Secondary | ICD-10-CM | POA: Insufficient documentation

## 2022-09-27 DIAGNOSIS — N1832 Chronic kidney disease, stage 3b: Secondary | ICD-10-CM | POA: Insufficient documentation

## 2022-09-27 DIAGNOSIS — I495 Sick sinus syndrome: Secondary | ICD-10-CM | POA: Insufficient documentation

## 2022-09-27 DIAGNOSIS — I4819 Other persistent atrial fibrillation: Secondary | ICD-10-CM | POA: Insufficient documentation

## 2022-09-27 DIAGNOSIS — I2584 Coronary atherosclerosis due to calcified coronary lesion: Secondary | ICD-10-CM | POA: Insufficient documentation

## 2022-09-27 DIAGNOSIS — Z79899 Other long term (current) drug therapy: Secondary | ICD-10-CM | POA: Insufficient documentation

## 2022-09-27 DIAGNOSIS — I2510 Atherosclerotic heart disease of native coronary artery without angina pectoris: Secondary | ICD-10-CM | POA: Diagnosis not present

## 2022-09-27 DIAGNOSIS — E785 Hyperlipidemia, unspecified: Secondary | ICD-10-CM | POA: Diagnosis not present

## 2022-09-27 DIAGNOSIS — R42 Dizziness and giddiness: Secondary | ICD-10-CM | POA: Diagnosis not present

## 2022-09-27 DIAGNOSIS — R7989 Other specified abnormal findings of blood chemistry: Secondary | ICD-10-CM | POA: Diagnosis not present

## 2022-09-27 LAB — T4, FREE: Free T4: 0.55 ng/dL — ABNORMAL LOW (ref 0.61–1.12)

## 2022-09-27 LAB — TSH: TSH: 22.801 u[IU]/mL — ABNORMAL HIGH (ref 0.350–4.500)

## 2022-09-27 NOTE — Telephone Encounter (Signed)
Called lab and was informed that they will add on the Free T4, but the total T3 is a send out lab and the patient will have to return for another lab draw.  Called patient and informed her that she will have to come back for another lab draw sometime this week. Reviewed previous and current results with her. Patient was verbalized understanding and agreed with plan.

## 2022-09-27 NOTE — Telephone Encounter (Signed)
-----   Message from Rise Mu, PA-C sent at 09/27/2022 10:56 AM EST ----- TSH function remains abnormal, despite being off amiodarone for several weeks. Please add on a free T4 and total T3.

## 2022-09-28 LAB — T3: T3, Total: 132 ng/dL (ref 71–180)

## 2022-10-04 NOTE — Progress Notes (Signed)
Cardiology Office Note    Date:  10/11/2022   ID:  Ainsley, Deakins [birth year], MRN [id number]  PCP:  [name], MD  Cardiologist:  Ida Rogue, MD  Electrophysiologist:  Melida Quitter, MD   Chief Complaint: Follow-up  History of Present Illness:   Brenda [name] is a 71 y.o. female with history of coronary artery calcification by CT imaging, persistent Afib, sinus pauses, DM2, HLD, CLL, CKD stage IIIb-IV, hypothyroidism, morbid obesity, and anemia who presents for follow up of A-fib.   Calcium score in 12/2015 of 1031, which was the 97th percentile, with diffuse calcification in the entire LAD and RCA. Noncardiac overread also noted multiple pulmonary nodules scattered throughout the lungs bilaterally as noted below, along with a mildly enlarged right axillary lymph node with follow up imaging in 11/2017 showing stable pulmonary nodules as well as borderline to mildly enlarged axillary, subpectoral, and mediastinal lymph nodes. Subsequent PET scan noted to be consistent with CLL, confirmed by biopsy.   She was diagnosed with new onset Afib at office visit in 03/2021, started on Eliquis, and was found to be in sinus rhythm in follow up in 04/2021. Echo from 05/2021 demonstrated an EF of 60-65%, no RWMA, normal LV diastolic function parameters, normal RV systolic function and ventricular cavity size, and mild mitral regurgitation.   She was seen in the office on 04/19/2022 and noted to be back in Afib with RVR, despite escalation of metoprolol via My Chart prior to the OV. She was started on amiodarone and continued on metoprolol and Eliquis.     She was seen in the office in 05/2022 and was without symptoms of angina or decompensation.  Following the initiation of amiodarone, the next morning she noted her heart rates were down in the upper 40s bpm.  At times, she was having to hold her metoprolol due to heart rates less than 60 bpm.  EKG showed sinus rhythm.  Blood  pressure readings were elevated at home and felt to be exacerbated by intermittent holding of metoprolol.  She was transitioned from Toprol-XL to carvedilol.  Subsequent BP readings from home remained on the higher side leading to titration of carvedilol and lisinopril.   She was seen in the office on 07/07/2022 and was without symptoms of angina or decompensation.  She continued to note dizziness that is typically worse in the mornings approximately 1 to 2 hours after taking her morning medications.  During this timeframe, her BP was frequently on the low side ranging from the low 789F to 81O systolic with a rare reading of 87 systolic.  Heart rates are typically in the mid to upper 50s to mid 60s bpm.  Due to dizziness, Zio patch was placed.    We received a call report from [facility] on 07/30/2022 with noted sinus pauses with the longest lasting 7.9 seconds. Given this, we recommended she go to the [facility] ED for evaluation by EP. At [facility], it was recommended she hold Coreg and amiodarone. Her TSH was also noted to be elevated at 11.721. Given these factors, implantation of pacer was deferred at that time with the patient being discharged on repeat Zio patch to evaluate for high grade AV block/prolonged pauses and to quantify Afib burden off beta blocker and amiodarone.  She was seen in the office on 08/13/2022 and was without symptoms of angina or decompensation.  She had not had any further dizziness off carvedilol and amiodarone.  No presyncope or  syncope.  She felt like she had improved energy off beta-blocker as well.  ZIO Suite was reviewed and showed no further sinus pauses, evidence of high-grade AV block, or A-fib.  She followed up with the EP on 08/30/2022 with continued monitoring recommended.  She comes in doing well from a cardiac perspective and is without symptoms of angina or decompensation.  Since she was last seen she did have 1 episode of dizziness, though reported her heart rate was  regular by her check.  This episode of dizziness did not feel similar to her prior episodes.  She is scheduled to follow-up with endocrinology at the end of January.  Home blood pressure readings have been well-controlled.  Home heart rate readings have been stable without episodes of bradycardia or tachycardia.   Labs independently reviewed: 09/2022 -TSH 22.801, free T4 low 0.55, total T3 normal 08/2022 - Hgb 11.3, PLT 124 07/2022 - TC 104, TG 193, HDL 21, LDL 44, potassium 4.2, BUN 32, serum creatinine 1.72, TSH 11.721, magnesium 1.9, A1c 6.3 06/2022 - albumin 4.4, AST/ALT not elevated 04/2022 - TSH normal  Past Medical History:  Diagnosis Date   Anxiety    Arthritis    Atrial fibrillation (HCC)    CLL (chronic lymphocytic leukemia) ([place])    Dx. 2019 Dr. 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 Particia Nearing first visit 11-2019    Past Surgical History:  Procedure Laterality Date   ABDOMINAL HYSTERECTOMY  1998   COLONOSCOPY     EYE SURGERY Bilateral    cataracts   EYE SURGERY Left    retina tear   FEMUR IM NAIL  10/25/2012   Procedure: INTRAMEDULLARY (IM) NAIL FEMORAL;  Surgeon: Mauri Pole, MD;  Location: [facility];  Service: Orthopedics;   Laterality: Left;   HIP SURGERY     Fracture L IM nail and 3 rods   left knee arthroscopy  12/2010   LYMPH NODE BIOPSY     axillary left 12-2018   REFRACTIVE SURGERY     skin cancer removed right and and lower forearm     squamoun in situ   TOTAL KNEE ARTHROPLASTY  12/20/2011   Procedure: TOTAL KNEE ARTHROPLASTY;  Surgeon: Gearlean Alf, MD;  Location: [facility];  Service: Orthopedics;  Laterality: Left;  Failed attempt at spinal    TOTAL SHOULDER ARTHROPLASTY Right 08/19/2016   Procedure: RIGHT TOTAL SHOULDER ARTHROPLASTY;  Surgeon: Justice Britain, MD;  Location: [facility];  Service: Orthopedics;  Laterality: Right;   TOTAL SHOULDER ARTHROPLASTY Left 01/10/2020  Procedure: TOTAL SHOULDER ARTHROPLASTY;  Surgeon: Justice Britain, MD;  Location: [facility];  Service: Orthopedics;  Laterality: Left;  132mn    Current Medications: Current Meds  Medication Sig   acetaminophen (TYLENOL) 500 MG tablet Take 500-1,000 mg by mouth every 6 (six) hours as needed for mild pain, moderate pain or fever.    ALPRAZolam (XANAX) 1 MG tablet TAKE 1/2 TO 1 TABLET(0.5 TO 1 MG) BY MOUTH TWICE DAILY AS NEEDED FOR ANXIETY   amoxicillin (AMOXIL) 500 MG capsule For dental appointment   apixaban (ELIQUIS) 5 MG TABS tablet Take 1 tablet (5 mg total) by mouth 2 (two) times daily.   BD PEN NEEDLE NANO 2ND GEN 32G X 4 MM MISC USE DAILY AS DIRECTED   Blood Glucose Monitoring Suppl (ONETOUCH VERIO) w/Device KIT Use as advised   buPROPion (WELLBUTRIN XL) 300 MG 24 hr tablet Take 1 tablet (300 mg total) by mouth daily.   Cholecalciferol (VITAMIN D3) 2000 units TABS Take 2,000 mcg by mouth daily.   Cyanocobalamin (B-12) 1000 MCG SUBL Place 1,000 mcg under the tongue every evening.    doxazosin (CARDURA) 2 MG tablet Take 1 tablet (2 mg total) by mouth daily after lunch.   esomeprazole (NEXIUM) 20 MG capsule Take 20 mg by mouth every Monday, Wednesday, and Friday.   ezetimibe (ZETIA) 10 MG tablet Take 1 tablet (10 mg total) by mouth  daily.   glucose blood (ONETOUCH VERIO) test strip TEST BLOOD SUGAR THREE TIMES DAILY AS DIRECTED   insulin glargine (LANTUS SOLOSTAR) 100 UNIT/ML Solostar Pen Inject 18-20 Units into the skin daily.   iron polysaccharides (NIFEREX) 150 MG capsule Take 150 mg by mouth at bedtime.    JARDIANCE 10 MG TABS tablet TAKE 1 TABLET(10 MG) BY MOUTH DAILY BEFORE BREAKFAST   Lancet Devices (LANCING DEVICE) MISC Use as advised - Freestyle   Lancets (ONETOUCH DELICA PLUS [id number] MISC TEST BLOOD SUGAR THREE TIMES DAILY AS DIRECTED   levothyroxine (SYNTHROID) 25 MCG tablet Take 1 tablet (25 mcg total) by mouth daily before breakfast.   NONFORMULARY OR COMPOUNDED ITEM Apply 1 application topically 2 (two) times daily as needed (sun damage on face). FLUOROURACIL 5% + CALCIPOTRIENE 0.005%   simvastatin (ZOCOR) 40 MG tablet TAKE 1 TABLET(40 MG) BY MOUTH AT BEDTIME   triamcinolone cream (KENALOG) 0.1 % Apply 1 application topically 2 (two) times daily as needed (skin rash.).     Allergies:   Gadolinium derivatives, Nsaids, Amlodipine, and Contrast media [iodinated contrast media]   Social History   Socioeconomic History   Marital status: Single    Spouse name: Not on file   Number of children: Not on file   Years of education: Not on file   Highest education level: Not on file  Occupational History   Occupation: RTherapist, 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    Employer: [employer]  Tobacco Use   Smoking status: Never   Smokeless tobacco: Never  Vaping Use   Vaping Use: Never used  Substance and Sexual Activity   Alcohol use: No   Drug use: No   Sexual activity: Not Currently  Other Topics Concern   Not on file  Social History Narrative   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   Social Determinants of Health   Financial Resource Strain: LWright (03/10/2022)   Overall Financial Resource Strain (CARDIA)    Difficulty of Paying Living Expenses: Not hard at all  Food Insecurity: No Food Insecurity (08/24/2022)   Hunger  Vital  Sign    Worried About Charity fundraiser in the Last Year: Never true    [place] in the Last Year: Never true  Transportation Needs: No Transportation Needs (08/24/2022)   PRAPARE - Hydrologist (Medical): No    Lack of Transportation (Non-Medical): No  Physical Activity: Inactive (03/10/2022)   Exercise Vital Sign    Days of Exercise per Week: 0 days    Minutes of Exercise per Session: 0 min  Stress: No Stress Concern Present (03/10/2022)   [place]    Feeling of Stress : Not at all  Social Connections: Moderately Isolated (03/10/2022)   Social Connection and Isolation Panel [NHANES]    Frequency of Communication with Friends and Family: More than three times a week    Frequency of Social Gatherings with Friends and Family: More than three times a week    Attends Religious Services: More than 4 times per year    Active Member of [employer] or Organizations: No    Attends Archivist Meetings: Never    Marital Status: Never married     Family History:  The patient's family history includes Cancer in her father and mother. There is no history of Breast cancer.  ROS:   12-point review of systems is negative unless otherwise noted in the HPI.   EKGs/Labs/Other Studies Reviewed:    Studies reviewed were summarized above. The additional studies were reviewed today:   Zio patch 08/2022: Preliminary read - Patient had a min HR of 45 bpm, max HR of 131 bpm, and avg HR of 66 bpm. Predominant underlying rhythm was Sinus Rhythm. First Degree AV Block was present. 4 Supraventricular Tachycardia runs occurred, the run with the fastest interval lasting 5 beats  with a max rate of 130 bpm, the longest lasting 9 beats with an avg rate of 104 bpm. Isolated SVEs were rare (<1.0%, 1280), SVE Couplets were rare (<1.0%, 88), and SVE Triplets were rare (<1.0%, 36). Isolated VEs were rare  (<1.0%), and no VE Couplets or  VE Triplets were present. __________  Elwyn Reach patch 07/2022: Normal sinus rhythm Patient had a min HR of 24 bpm, max HR of 129 bpm, and avg HR of 63 bpm.    Atrial Fibrillation occurred (27% burden), ranging from 24-129 bpm (avg of 76 bpm), the longest lasting 3 days 19 hours with an avg rate of 76 bpm. 66 Pauses occurred, the longest lasting 7.9 secs (8 bpm).  Atrial Fibrillation and Pause were detected within +/- 45 seconds of symptomatic patient event(s).    Isolated SVEs were rare (<1.0%), SVE Couplets were rare (<1.0%), and SVE Triplets were rare (<1.0%). Isolated VEs were rare (<1.0%), and no VE Couplets or VE Triplets were present.   2D echo 05/22/2021: 1. Left ventricular ejection fraction, by estimation, is 60 to 65%. The  left ventricle has normal function. The left ventricle has no regional  wall motion abnormalities. Left ventricular diastolic parameters were  normal.   2. Right ventricular systolic function is normal. The right ventricular  size is normal.   3. The mitral valve is normal in structure. Mild mitral valve  regurgitation. __________   Calcium score 01/13/2016:   Noncardiac over read: 1. There multiple pulmonary nodules scattered throughout the lungs bilaterally, the largest of which measures up to 9 mm in the left lower lobe. Non-contrast chest CT at 3-6 months is recommended. If the nodules  are stable at time of repeat CT, then future CT at 18-24 months (from today's scan) is considered optional for low-risk patients, but is recommended for high-risk patients. This recommendation follows the consensus statement: Guidelines for Management of Incidental Pulmonary Nodules Detected on CT Images:From the Fleischner Society 2017; published online before print (10.1148/radiol.2703500938). 2. Mildly enlarged right axillary lymph node measuring 1 cm in short axis. This is nonspecific, but correlation with mammography is suggested in  the near future. If mammography is abnormal, further evaluation with contrast enhanced CT of the chest, abdomen and pelvis could be considered to evaluate for metastatic disease. These results will be called to the ordering clinician or representative by the Radiologist Assistant, and communication documented in the PACS or zVision Dashboard.   Cardiac overread: IMPRESSION: Coronary calcium score of 1031. This was 97th percentile for age and sex matched control. Consider f/u perfusion study to further assess CAD See radiology report regarding right axillary lymph node and pulmonary nodules   EKG:  EKG is ordered today.  The EKG ordered today demonstrates NSR, 81 bpm, left axis deviation, low voltage QRS, poor R wave progression along the precordial leads, no acute ST-T changes, consistent with prior tracing  Recent Labs: 06/16/2022: ALT 10 07/30/2022: Magnesium 1.9 07/31/2022: BUN 32; Creatinine, Ser 1.72; Potassium 4.2; Sodium 140 08/01/2022: Hemoglobin 11.3; Platelets 124 09/27/2022: TSH 22.801  Recent Lipid Panel    Component Value Date/Time   CHOL 104 07/31/2022 0500   CHOL 124 07/20/2021 0915   TRIG 193 (H) 07/31/2022 0500   HDL 21 (L) 07/31/2022 0500   HDL 29 (L) 07/20/2021 0915   CHOLHDL 5.0 07/31/2022 0500   VLDL 39 07/31/2022 0500   LDLCALC 44 07/31/2022 0500   LDLCALC 73 07/20/2021 0915   LDLDIRECT 55.0 05/15/2020 0827    PHYSICAL EXAM:    VS:  BP 122/62 (BP Location: Left Arm, Patient Position: Sitting, Cuff Size: Normal)   Pulse 81   Ht 5' (1.524 m)   Wt 183 lb (83 kg)   SpO2 97%   BMI 35.74 kg/m   BMI: Body mass index is 35.74 kg/m.  Physical Exam Vitals reviewed.  Constitutional:      Appearance: She is well-developed.  HENT:     Head: Normocephalic and atraumatic.  Eyes:     General:        Right eye: No discharge.        Left eye: No discharge.  Neck:     Vascular: No JVD.  Cardiovascular:     Rate and Rhythm: Normal rate and regular rhythm.      Pulses:          Posterior tibial pulses are 2+ on the right side and 2+ on the left side.     Heart sounds: Normal heart sounds, S1 normal and S2 normal. Heart sounds not distant. No midsystolic click and no opening snap. No murmur heard.    No friction rub.  Pulmonary:     Effort: Pulmonary effort is normal. No respiratory distress.     Breath sounds: Normal breath sounds. No decreased breath sounds, wheezing or rales.  Chest:     Chest wall: No tenderness.  Abdominal:     General: There is no distension.  Musculoskeletal:     Cervical back: Normal range of motion.  Skin:    General: Skin is warm and dry.     Nails: There is no clubbing.  Neurological:     Mental Status: She is alert  and oriented to person, place, and time.  Psychiatric:        Speech: Speech normal.        Behavior: Behavior normal.        Thought Content: Thought content normal.        Judgment: Judgment normal.     Wt Readings from Last 3 Encounters:  10/11/22 183 lb (83 kg)  08/30/22 183 lb (83 kg)  08/13/22 184 lb 9.6 oz (83.7 kg)     ASSESSMENT & PLAN:   Persistent A-fib: Maintaining sinus rhythm.  No longer on AV nodal blocking medication or amiodarone secondary to bradycardia and sinus pauses.  CHA2DS2-VASc at least 5.  She remains on apixaban 5 mg twice daily and does not meet reduced dosing criteria.  No symptoms concerning for bleeding.  Recent labs stable.  Follow-up with EP as directed.  Sinus pauses: Exacerbated by beta-blocker and amiodarone.  Repeat outpatient cardiac monitoring showed no further pauses off these medications.  Evaluated by EP with no indication for PPM implantation.  Follow-up with EP as directed.  Continue to avoid AV nodal blocking medications.  Coronary artery calcification/HLD: No symptoms suggestive of angina.  She remains on apixaban in place of aspirin to minimize bleeding risk given A-fib.  LDL 73.  She remains on simvastatin and ezetimibe.  No indication for  ischemic testing at this time.  HTN: Blood pressure is well-controlled in the office today.  She remains on Cardura and lisinopril.  CKD stage IIIb-IV: Followed by nephrology.  Avoid nephrotoxic agents.  Hypothyroidism: Start levothyroxine 25 mcg daily.  She is scheduled to be seen by endocrinology next month.  Repeat TSH, free T4, and total T3 prior to endocrinology visit.     Disposition: F/u with Dr. Rockey Situ or an APP in 6 months, and EP as directed.    Medication Adjustments/Labs and Tests Ordered: Current medicines are reviewed at length with the patient today.  Concerns regarding medicines are outlined above. Medication changes, Labs and Tests ordered today are summarized above and listed in the Patient Instructions accessible in Encounters.   Signed, Christell Faith, PA-C 10/11/2022 10:36 AM     [place] [address] [place], [place] [address] [phone]

## 2022-10-11 ENCOUNTER — Ambulatory Visit: Payer: Medicare Other | Attending: Physician Assistant | Admitting: Physician Assistant

## 2022-10-11 ENCOUNTER — Encounter: Payer: Self-pay | Admitting: Physician Assistant

## 2022-10-11 VITALS — BP 122/62 | HR 81 | Ht 60.0 in | Wt 183.0 lb

## 2022-10-11 DIAGNOSIS — I2584 Coronary atherosclerosis due to calcified coronary lesion: Secondary | ICD-10-CM | POA: Diagnosis not present

## 2022-10-11 DIAGNOSIS — I455 Other specified heart block: Secondary | ICD-10-CM | POA: Insufficient documentation

## 2022-10-11 DIAGNOSIS — N1832 Chronic kidney disease, stage 3b: Secondary | ICD-10-CM | POA: Diagnosis not present

## 2022-10-11 DIAGNOSIS — I4819 Other persistent atrial fibrillation: Secondary | ICD-10-CM | POA: Insufficient documentation

## 2022-10-11 DIAGNOSIS — E039 Hypothyroidism, unspecified: Secondary | ICD-10-CM | POA: Diagnosis not present

## 2022-10-11 DIAGNOSIS — I2510 Atherosclerotic heart disease of native coronary artery without angina pectoris: Secondary | ICD-10-CM | POA: Diagnosis not present

## 2022-10-11 DIAGNOSIS — E785 Hyperlipidemia, unspecified: Secondary | ICD-10-CM | POA: Diagnosis not present

## 2022-10-11 MED ORDER — LEVOTHYROXINE SODIUM 25 MCG PO TABS: 25.0000 ug | ORAL_TABLET | Freq: Every day | ORAL | 1 refills | Status: DC

## 2022-10-11 NOTE — Patient Instructions (Signed)
Medication Instructions:  Your physician has recommended you make the following change in your medication:   START Levothyroxine 25 mcg once dailyil   *If you need a refill on your cardiac medications before your next appointment, please call your pharmacy*   Lab Work: TSH, Free T4, and T3 to be done prior to your appointment with endocrinology.   If you have labs (blood work) drawn today and your tests are completely normal, you will receive your results only by: [place] (if you have MyChart) OR A paper copy in the mail If you have any lab test that is abnormal or we need to change your treatment, we will call you to review the results.   Testing/Procedures: None   Follow-Up: At [facility], you and your health needs are our priority.  As part of our continuing mission to provide you with exceptional heart care, we have created designated Provider Care Teams.  These Care Teams include your primary Cardiologist (physician) and Advanced Practice Providers (APPs -  Physician Assistants and Nurse Practitioners) who all work together to provide you with the care you need, when you need it.   Your next appointment:   6 month(s)  The format for your next appointment:   In Person  Provider:   Ida Rogue, MD or Christell Faith, PA-C       Important Information About Sugar

## 2022-10-13 NOTE — Addendum Note (Signed)
Addended by: Raelene Bott, [place] Catarino L on: 10/13/2022 09:29 AM   Modules accepted: Orders

## 2022-10-14 ENCOUNTER — Ambulatory Visit: Payer: Medicare Other | Attending: Internal Medicine | Admitting: Internal Medicine

## 2022-10-14 ENCOUNTER — Telehealth: Payer: Self-pay | Admitting: Internal Medicine

## 2022-10-14 ENCOUNTER — Emergency Department: Payer: Medicare Other

## 2022-10-14 ENCOUNTER — Emergency Department
Admission: EM | Admit: 2022-10-14 | Discharge: 2022-10-14 | Disposition: A | Discharge status: Home / Self Care | Payer: Medicare Other | Attending: Emergency Medicine | Admitting: Emergency Medicine

## 2022-10-14 ENCOUNTER — Encounter: Payer: Self-pay | Admitting: Internal Medicine

## 2022-10-14 ENCOUNTER — Other Ambulatory Visit: Payer: Self-pay

## 2022-10-14 ENCOUNTER — Telehealth: Payer: Self-pay | Admitting: Physician Assistant

## 2022-10-14 VITALS — BP 106/60 | HR 139 | Ht 60.0 in | Wt 181.0 lb

## 2022-10-14 DIAGNOSIS — I129 Hypertensive chronic kidney disease with stage 1 through stage 4 chronic kidney disease, or unspecified chronic kidney disease: Secondary | ICD-10-CM | POA: Diagnosis not present

## 2022-10-14 DIAGNOSIS — R0602 Shortness of breath: Secondary | ICD-10-CM | POA: Diagnosis not present

## 2022-10-14 DIAGNOSIS — I495 Sick sinus syndrome: Secondary | ICD-10-CM | POA: Diagnosis not present

## 2022-10-14 DIAGNOSIS — I4891 Unspecified atrial fibrillation: Secondary | ICD-10-CM | POA: Diagnosis not present

## 2022-10-14 DIAGNOSIS — Z856 Personal history of leukemia: Secondary | ICD-10-CM | POA: Diagnosis not present

## 2022-10-14 DIAGNOSIS — N189 Chronic kidney disease, unspecified: Secondary | ICD-10-CM | POA: Diagnosis not present

## 2022-10-14 DIAGNOSIS — I2510 Atherosclerotic heart disease of native coronary artery without angina pectoris: Secondary | ICD-10-CM | POA: Diagnosis not present

## 2022-10-14 DIAGNOSIS — R002 Palpitations: Secondary | ICD-10-CM | POA: Diagnosis present

## 2022-10-14 DIAGNOSIS — Z7901 Long term (current) use of anticoagulants: Secondary | ICD-10-CM | POA: Diagnosis not present

## 2022-10-14 DIAGNOSIS — E1122 Type 2 diabetes mellitus with diabetic chronic kidney disease: Secondary | ICD-10-CM | POA: Diagnosis not present

## 2022-10-14 LAB — CBC WITH DIFFERENTIAL/PLATELET
Abs Immature Granulocytes: 0.08 10*3/uL — ABNORMAL HIGH (ref 0.00–0.07)
Basophils Absolute: 0.1 10*3/uL (ref 0.0–0.1)
Basophils Relative: 0 %
Eosinophils Absolute: 0.2 10*3/uL (ref 0.0–0.5)
Eosinophils Relative: 1 %
HCT: 42.3 % (ref 36.0–46.0)
Hemoglobin: 13.3 g/dL (ref 12.0–15.0)
Immature Granulocytes: 0 %
Lymphocytes Relative: 73 %
Lymphs Abs: 18.4 10*3/uL — ABNORMAL HIGH (ref 0.7–4.0)
MCH: 29.4 pg (ref 26.0–34.0)
MCHC: 31.4 g/dL (ref 30.0–36.0)
MCV: 93.6 fL (ref 80.0–100.0)
Monocytes Absolute: 0.7 10*3/uL (ref 0.1–1.0)
Monocytes Relative: 3 %
Neutro Abs: 5.8 10*3/uL (ref 1.7–7.7)
Neutrophils Relative %: 23 %
Platelets: 145 10*3/uL — ABNORMAL LOW (ref 150–400)
RBC: 4.52 MIL/uL (ref 3.87–5.11)
RDW: 12.9 % (ref 11.5–15.5)
Smear Review: NORMAL
WBC: 25.2 10*3/uL — ABNORMAL HIGH (ref 4.0–10.5)
nRBC: 0 % (ref 0.0–0.2)

## 2022-10-14 LAB — BASIC METABOLIC PANEL
Anion gap: 5 (ref 5–15)
BUN: 43 mg/dL — ABNORMAL HIGH (ref 8–23)
CO2: 20 mmol/L — ABNORMAL LOW (ref 22–32)
Calcium: 9.1 mg/dL (ref 8.9–10.3)
Chloride: 111 mmol/L (ref 98–111)
Creatinine, Ser: 2.17 mg/dL — ABNORMAL HIGH (ref 0.44–1.00)
GFR, Estimated: 23 mL/min — ABNORMAL LOW (ref >60)
Glucose, Bld: 116 mg/dL — ABNORMAL HIGH (ref 70–99)
Potassium: 4.6 mmol/L (ref 3.5–5.1)
Sodium: 136 mmol/L (ref 135–145)

## 2022-10-14 LAB — PATHOLOGIST SMEAR REVIEW

## 2022-10-14 LAB — TROPONIN I (HIGH SENSITIVITY): Troponin I (High Sensitivity): 25 ng/L — ABNORMAL HIGH (ref <18)

## 2022-10-14 LAB — MAGNESIUM: Magnesium: 2 mg/dL (ref 1.7–2.4)

## 2022-10-14 MED ORDER — ETOMIDATE 2 MG/ML IV SOLN
10.0000 mg | Freq: Once | INTRAVENOUS | Status: AC
  Administered 2022-10-14: 10 mg via INTRAVENOUS
  Filled 2022-10-14: qty 10

## 2022-10-14 NOTE — Patient Instructions (Signed)
Medication Instructions:  Your Physician recommend you continue on your current medication as directed.    *If you need a refill on your cardiac medications before your next appointment, please call your pharmacy*   Lab Work: None ordered today   Testing/Procedures: None ordered today   Follow-Up: At [facility], you and your health needs are our priority.  As part of our continuing mission to provide you with exceptional heart care, we have created designated Provider Care Teams.  These Care Teams include your primary Cardiologist (physician) and Advanced Practice Providers (APPs -  Physician Assistants and Nurse Practitioners) who all work together to provide you with the care you need, when you need it.  We recommend signing up for the patient portal called "MyChart".  Sign up information is provided on this After Visit Summary.  MyChart is used to connect with patients for Virtual Visits (Telemedicine).  Patients are able to view lab/test results, encounter notes, upcoming appointments, etc.  Non-urgent messages can be sent to your provider as well.   To learn more about what you can do with MyChart, go to [email or web address].    Your next appointment:   Based on hospital recommendations  The format for your next appointment:   In Person  Provider:   You may see Ida Rogue, MD or one of the following Advanced Practice Providers on your designated Care Team:   Murray Hodgkins, NP Christell Faith, PA-C Cadence Kathlen Mody, PA-C Gerrie Nordmann, NP

## 2022-10-14 NOTE — Telephone Encounter (Signed)
I spoke with the patient to discuss setting her up for an EKG at [facility] today/ tomorrow.  Per the patient, her heart rates are 130's this morning. I inquired what her BP is running and she advised her SBP is 77.  I advised the patient with her BP/ HR readings she may be best served in the ER.  The patient advised "I will not go to [facility], they almost killed me."  I offered the patient at 1:20 pm appointment with Dr. Saunders Revel today. She is agreeable, however I did advise her if she is unstable she may still end up in the ER. The patient stated "you'll are making it really inconvenient for me, I live by myself, I have a dog I have to board..." I advised the patient we are trying our best to treat her appropriately, however, she may require a higher level of care vs what we can provide her in the office.   The patient will come her to appointment at 1:20 pm today. However, she has again been advised if her symptoms worsen, she should call 911/ report to the ER.   She advised she will be here this afternoon.

## 2022-10-14 NOTE — Progress Notes (Signed)
Follow-up Outpatient Visit Date: 10/14/2022  Primary Care Provider: [name], MD [place] [address]  Chief Complaint: High heart rate  HPI:  Brenda [name] is a 71 y.o. female with history of  coronary artery calcification by CT imaging, persistent Afib, sinus pauses, DM2, HLD, CLL, CKD stage IIIb-IV, hypothyroidism, morbid obesity, who presents for urgent evaluation of tachycardia.  She was just seen 3 days ago in our office by Christell Faith, PA, at which time she was doing well.  EKG showed normal sinus rhythm with rate of 81 bpm.  She was not on any AV-nodal blocking agents due to history of sinus pauses.  She reached out to our office yesterday complaining of increasing heart rates (up to 120 bpm) since having started levothyroxine two days ago.  Brenda [name] reports that she has been experiencing palpitations over the last 2 days, which she describes as flutters.  She also has felt more tired and short of breath.  She got out of breath walking across the parking lot to our office today, which was not noticeable when she was here on Monday.  She has not had any chest pain or lightheadedness.  She has chronic swelling of the left ankle after a remote injury that has not changed.  She has been compliant with her medications including apixaban, having taken her last dose this morning.  --------------------------------------------------------------------------------------------------  Past Medical History:  Diagnosis Date   Anxiety    Arthritis    Atrial fibrillation (HCC)    CLL (chronic lymphocytic leukemia) ([place])    Dx. 2019 Dr. 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 Particia Nearing first visit 11-2019   Past Surgical History:  Procedure Laterality Date   ABDOMINAL HYSTERECTOMY  1998   COLONOSCOPY     EYE SURGERY Bilateral    cataracts   EYE SURGERY Left    retina tear   FEMUR IM NAIL  10/25/2012   Procedure: INTRAMEDULLARY (IM) NAIL FEMORAL;  Surgeon: Mauri Pole, MD;  Location: [facility];  Service: Orthopedics;  Laterality: Left;   HIP SURGERY     Fracture L IM nail and 3 rods   left knee arthroscopy  12/2010   LYMPH NODE BIOPSY     axillary left 12-2018   REFRACTIVE SURGERY     skin cancer removed right and and lower forearm     squamoun in situ   TOTAL KNEE ARTHROPLASTY  12/20/2011   Procedure: TOTAL KNEE ARTHROPLASTY;  Surgeon: Gearlean Alf, MD;  Location:  [facility];  Service: Orthopedics;  Laterality: Left;  Failed attempt at spinal    TOTAL SHOULDER ARTHROPLASTY Right 08/19/2016   Procedure: RIGHT TOTAL SHOULDER ARTHROPLASTY;  Surgeon: Justice Britain, MD;  Location: [facility];  Service: Orthopedics;  Laterality: Right;   TOTAL SHOULDER ARTHROPLASTY Left 01/10/2020   Procedure: TOTAL SHOULDER ARTHROPLASTY;  Surgeon: Justice Britain, MD;  Location: [facility];  Service: Orthopedics;  Laterality: Left;  176mn    Current Meds  Medication Sig   acetaminophen (TYLENOL) 500 MG tablet Take 500-1,000 mg by mouth every 6 (six) hours as needed for mild pain, moderate pain or fever.    ALPRAZolam (XANAX) 1 MG tablet TAKE 1/2 TO 1 TABLET(0.5 TO 1 MG) BY MOUTH TWICE DAILY AS NEEDED FOR ANXIETY   amoxicillin (AMOXIL)  500 MG capsule For dental appointment   apixaban (ELIQUIS) 5 MG TABS tablet Take 1 tablet (5 mg total) by mouth 2 (two) times daily.   BD PEN NEEDLE NANO 2ND GEN 32G X 4 MM MISC USE DAILY AS DIRECTED   Blood Glucose Monitoring Suppl (ONETOUCH VERIO) w/Device KIT Use as advised   buPROPion (WELLBUTRIN XL) 300 MG 24 hr tablet Take 1 tablet (300 mg total) by mouth daily.   Cholecalciferol (VITAMIN D3) 2000 units TABS Take 2,000 mcg by mouth daily.   Cyanocobalamin (B-12) 1000 MCG SUBL Place 1,000 mcg under the tongue every evening.    doxazosin (CARDURA) 2 MG tablet Take 1 tablet (2 mg total) by mouth daily after lunch.   esomeprazole (NEXIUM) 20 MG capsule Take 20 mg by mouth every Monday, Wednesday, and Friday.   ezetimibe (ZETIA) 10 MG tablet Take 1 tablet (10 mg total) by mouth daily.   glucose blood (ONETOUCH VERIO) test strip TEST BLOOD SUGAR THREE TIMES DAILY AS DIRECTED   insulin glargine (LANTUS SOLOSTAR) 100 UNIT/ML Solostar Pen Inject 18-20 Units into the skin daily.   iron polysaccharides (NIFEREX) 150 MG capsule Take 150 mg by mouth at bedtime.    JARDIANCE 10 MG TABS tablet TAKE 1 TABLET(10 MG) BY MOUTH DAILY BEFORE BREAKFAST   Lancet Devices (LANCING DEVICE) MISC Use as advised - Freestyle   Lancets (ONETOUCH DELICA PLUS [id number] MISC TEST BLOOD SUGAR THREE TIMES DAILY AS DIRECTED   levothyroxine (SYNTHROID) 25 MCG tablet Take 1 tablet (25 mcg total) by mouth daily before breakfast.   lisinopril (ZESTRIL) 20 MG tablet Take 1 tablet (20 mg total) by mouth 2 (two) times daily.   NONFORMULARY OR COMPOUNDED ITEM Apply 1 application topically 2 (two) times daily as needed (sun damage on face). FLUOROURACIL 5% + CALCIPOTRIENE 0.005%   simvastatin (ZOCOR) 40 MG tablet TAKE 1 TABLET(40 MG) BY MOUTH AT BEDTIME   triamcinolone cream (KENALOG) 0.1 % Apply 1 application topically 2 (two) times daily as needed (skin rash.).     Allergies: Gadolinium derivatives, Nsaids, Amlodipine, and  Contrast media [iodinated contrast media]  Social History   Tobacco Use   Smoking status: Never   Smokeless tobacco: Never  Vaping Use   Vaping Use: Never used  Substance Use Topics   Alcohol use: No   Drug use: No    Family History  Problem Relation Age of Onset   Cancer Mother        breast cancer   Cancer Father        plasma sarcoma   Breast cancer Neg Hx     Review of Systems: A 12-system review of systems was performed and was negative except as noted in  the HPI.  --------------------------------------------------------------------------------------------------  Physical Exam: BP 106/60 (BP Location: Left Arm, Patient Position: Sitting, Cuff Size: Normal)   Pulse (!) 139   Ht 5' (1.524 m)   Wt 181 lb (82.1 kg)   SpO2 97%   BMI 35.35 kg/m   General:  NAD. Neck: No JVD or HJR. Lungs: Clear to auscultation bilaterally without wheezes or crackles. Heart: Tachycardic and irregular without murmurs. Abdomen: Soft, nontender, nondistended. Extremities: 1+ left ankle edema.  EKG: Atrial fibrillation with rapid ventricular response as well as possible inferior and anterolateral infarcts.  Compared to prior tracing from 10/11/2022, atrial fibrillation with rapid ventricular response has replaced normal sinus rhythm.  Lab Results  Component Value Date   WBC 9.7 08/01/2022   HGB 11.3 (L) 08/01/2022   HCT 36.0 08/01/2022   MCV 93.5 08/01/2022   PLT 124 (L) 08/01/2022    Lab Results  Component Value Date   NA 140 07/31/2022   K 4.2 07/31/2022   CL 112 (H) 07/31/2022   CO2 22 07/31/2022   BUN 32 (H) 07/31/2022   CREATININE 1.72 (H) 07/31/2022   GLUCOSE 102 (H) 07/31/2022   ALT 10 06/16/2022    Lab Results  Component Value Date   CHOL 104 07/31/2022   HDL 21 (L) 07/31/2022   LDLCALC 44 07/31/2022   LDLDIRECT 55.0 05/15/2020   TRIG 193 (H) 07/31/2022   CHOLHDL 5.0 07/31/2022     --------------------------------------------------------------------------------------------------  ASSESSMENT AND PLAN: Atrial fibrillation with rapid ventricular response and sinus node dysfunction: Brenda [name] is back in atrial fibrillation with rapid ventricular response.  She has modest symptoms consisting of fatigue and exertional dyspnea with mild activity, new compared to her visit earlier this week.  I am concerned about potential for severe bradycardia with any AV nodal blocking agents were she to spontaneously convert back to sinus rhythm, as prior ambulatory monitoring showed pauses of up to 8 seconds.  I have recommended that Brenda [name] go to the ER for labs and possible cardioversion in the ED.  I have communicated my recommendations to Dr. Charna Archer (EDP) and Dr. Rockey Situ (inpatient cardiologist today).  She should remain on apixaban, which she has been compliant with.  Coronary artery disease: Brenda [name] has a history of high coronary calcium score but does not appear to have undergone any ischemia testing.  Her EKG today again shows possible anterolateral and inferior infarcts, noted on prior EKGs.  Ischemia evaluation may need to be considered in the future once her atrial fibrillation has been better controlled.  Shared Decision Making/Informed Consent The risks (stroke, cardiac arrhythmias rarely resulting in the need for a temporary or permanent pacemaker, skin irritation or burns and complications associated with conscious sedation including aspiration, arrhythmia, respiratory failure and death), benefits (restoration of normal sinus rhythm) and alternatives of a direct current cardioversion were explained in detail to Brenda [name] and she agrees to proceed.   Follow-up: To be determined based on ED/hospital course.  Nelva Bush, MD 10/14/2022 1:31 PM

## 2022-10-14 NOTE — ED Provider Notes (Signed)
[facility] Provider Note    Event Date/Time   First MD Initiated Contact with Patient 10/14/22 1427     (approximate)   History   Chief Complaint Palpitations   HPI  Brenda [name] is a 71 y.o. female with past medical history of hypertension, diabetes, CAD, atrial fibrillation on Eliquis, CKD, and CLL who presents to the ED complaining of palpitations.  Patient reports that she has had 2 days of persistent sensation that her heart is racing.  She denies any associated pain in her chest, but states that she did get out of breath when walking into the cardiology office earlier today.  She has not missed any doses of her Eliquis, with her last dose coming at 7:00 this morning.  She is otherwise felt well with no fevers or cough, denies nausea, vomiting, diarrhea, abdominal pain, or dysuria.  She was initially seen by her cardiologist, Dr. Saunders Revel, who recommended she come to the ED for further evaluation and possible cardioversion.     Physical Exam   Triage Vital Signs: ED Triage Vitals  Enc Vitals Group     BP 10/14/22 1421 129/79     Pulse Rate 10/14/22 1421 (!) 143     Resp 10/14/22 1421 (!) 22     Temp 10/14/22 1421 98.7 F (37.1 C)     Temp Source 10/14/22 1421 Oral     SpO2 10/14/22 1421 97 %     Weight --      Height --      Head Circumference --      Peak Flow --      Pain Score 10/14/22 1417 0     Pain Loc --      Pain Edu? --      Excl. in [place]? --     Most recent vital signs: Vitals:   10/14/22 1421 10/14/22 1430  BP: 129/79 119/69  Pulse: (!) 143 (!) 135  Resp: (!) 22 14  Temp: 98.7 F (37.1 C)   SpO2: 97% 95%    Constitutional: Alert and oriented. Eyes: Conjunctivae are normal. Head: Atraumatic. Nose: No congestion/rhinnorhea. Mouth/Throat: Mucous membranes are moist.  Cardiovascular: Tachycardic, irregularly irregular rhythm. Grossly normal heart sounds.  2+ radial pulses bilaterally. Respiratory: Normal respiratory  effort.  No retractions. Lungs CTAB. Gastrointestinal: Soft and nontender. No distention. Musculoskeletal: No lower extremity tenderness nor edema.  Neurologic:  Normal speech and language. No gross focal neurologic deficits are appreciated.    ED Results / Procedures / Treatments   Labs (all labs ordered are listed, but only abnormal results are displayed) Labs Reviewed  CBC WITH DIFFERENTIAL/PLATELET - Abnormal; Notable for the following components:      Result Value   WBC 25.2 (*)    Platelets 145 (*)    All other components within normal limits  BASIC METABOLIC PANEL  MAGNESIUM  TROPONIN I (HIGH SENSITIVITY)     EKG  ED ECG REPORT I, Blake Divine, the attending physician, personally viewed and interpreted this ECG.   Date: 10/14/2022  EKG Time: 14:23  Rate: 129  Rhythm: atrial fibrillation  Axis: Normal  Intervals:none  ST&T Change: None  RADIOLOGY Chest x-ray reviewed and interpreted by me with mild pulmonary vascular congestion, no focal infiltrate or effusion noted.  PROCEDURES:  Critical Care performed: Yes, see critical care procedure note(s)  .Critical Care  Performed by: Blake Divine, MD Authorized by: Blake Divine, MD   Critical care provider statement:    Critical  care time (minutes):  30   Critical care time was exclusive of:  Separately billable procedures and treating other patients and teaching time   Critical care was necessary to treat or prevent imminent or life-threatening deterioration of the following conditions:  Cardiac failure   Critical care was time spent personally by me on the following activities:  Development of treatment plan with patient or surrogate, discussions with consultants, evaluation of patient's response to treatment, examination of patient, ordering and review of laboratory studies, ordering and review of radiographic studies, ordering and performing treatments and interventions, pulse oximetry, re-evaluation of  patient's condition and review of old charts   I assumed direction of critical care for this patient from another provider in my specialty: no     Care discussed with: admitting provider      [place] ED: Medications - No data to display   IMPRESSION / MDM / [place] / ED COURSE  I reviewed the triage vital signs and the nursing notes.                              71 y.o. female with past medical history of hypertension, diabetes, CAD, atrial fibrillation on Eliquis, CKD, and CLL who presents to the ED complaining of 2 days of intermittent fluttering in her chest, does have some difficulty breathing earlier today.  Patient's presentation is most consistent with acute presentation with potential threat to life or bodily function.  Differential diagnosis includes, but is not limited to, atrial fibrillation, ACS, PE, pneumonia, electrolyte abnormality, AKI, anemia.  Patient nontoxic-appearing and in no acute distress, vital signs remarkable for tachycardia but otherwise reassuring with stable blood pressure.  EKG shows atrial fibrillation with RVR, no ischemic changes noted.  She is currently asymptomatic while at rest, but apparently did have some difficulty breathing with exertion earlier today.  She comes from the cardiology office and I had a discussion with Dr. Saunders Revel, who had seen her there.  He recommends that patient undergo cardioversion given she does not respond well to beta-blockade and has significant bradycardia when in sinus rhythm.  We will screen labs for electrolyte abnormality or other finding that could be contributing to her atrial fibrillation, also check chest x-ray.  Patient denies missing any doses of her Eliquis recently.  Patient turned over to on provider pending lab results and cardioversion.  Per cardiology, patient would benefit from observation admission following cardioversion.      FINAL CLINICAL IMPRESSION(S) / ED DIAGNOSES   Final  diagnoses:  Atrial fibrillation with RVR ([place])     Rx / DC Orders   ED Discharge Orders     None        Note:  This document was prepared using Dragon voice recognition software and may include unintentional dictation errors.   Blake Divine, MD 10/14/22 [phone]

## 2022-10-14 NOTE — ED Triage Notes (Signed)
PT sent from cardiology office for afib rvr in the 130s-150s. PT on blood thinners. Clinic stated they would like a possible cardioversion

## 2022-10-14 NOTE — Telephone Encounter (Signed)
Pt calling back from mychart message yesterday. She is trying to sch an appt for an EKG, Please advise

## 2022-10-14 NOTE — Telephone Encounter (Signed)
Patient's appointment for tomorrow needed to be rescheduled due to provider schedule change. Patient is rescheduled for 11/02/22 but notes that her ALPRAZolam Duanne Moron) 1 MG tablet will need to be refilled next week. Patient is requesting refill be sent to [facility], [address] AT [address]   Patient's call back number is [phone]

## 2022-10-14 NOTE — ED Notes (Addendum)
1607 Pt placed on 2L [place], IV NS KVO, Time out with MD Paduchowski. Consent signed  previously and in chart. [place] given by RN Merleen Nicely  1609 pt resting, eyes closed chest rise and fall noted.  Assumption delivered. Remains in Afib. EKG obtained 1612 75J delivered. NSR, EKG obtained 1615 Pt responds to verbal stimuli 1620 Pt return to baseline. Alert and oriented.

## 2022-10-14 NOTE — ED Notes (Signed)
Dr. Charna Archer notified of patient

## 2022-10-14 NOTE — ED Provider Notes (Signed)
Patient care assumed from Dr. Charna Archer, report of atrial fibrillation with rapid ventricular response sent from cardiology for cardioversion in the emergency department and admission to the hospital for ongoing monitoring and management.  I reviewed the patient's most recent cardiology note from 10/11/2022, patient has a history of CAD, persistent atrial fibrillation, sinus pauses, diabetes, hyperlipidemia, CLL, CKD and anemia who was seen in the office for atrial fibrillation on 10/11/2022.  Patient had a recent Zio patch reading from October which the patient predominantly was in a sinus rhythm.  Has a history of sinus pauses exacerbated by beta-blockers and amiodarone.  Reviewed the note from today by Dr. Saunders Revel for tachycardia that started over the past 2 days.  Shortness of breath with exertion.  But denies any chest pain.  EKG performed in the office confirmed atrial fibrillation with rapid ventricular response.  Cardiology concerned about AV nodal blocking agents leading to severe sinus pauses and bradycardia as she has previously had sinus pauses up to 8 seconds.  Refer to go to the emergency department for labs and cardioversion.  We will check labs.  Place an IV, gentle IV hydration and prepare for likely sedation and cardioversion.  I have reviewed and interpreted the chest x-ray images I do not see any obvious consolidation on my evaluation. Radiology has read the x-ray as vascular congestion without edema. CBC has resulted showing leukocytosis of 25,000, most recent white cell counts have been in the 10-15 range, although previously she is been in the 20s back in 2021.  .Cardioversion  Date/Time: 10/14/2022 3:51 PM  Performed by: Harvest Dark, MD Authorized by: Harvest Dark, MD   Consent:    Consent obtained:  Written   Consent given by:  Patient   Risks discussed:  Induced arrhythmia   Alternatives discussed:  No treatment Pre-procedure details:    Cardioversion basis:   Emergent   Rhythm:  Atrial fibrillation   Electrode placement:  Anterior-lateral Patient sedated: Yes. Refer to sedation procedure documentation for details of sedation.  Attempt one:    Cardioversion mode:  Synchronous   Waveform:  Biphasic   Shock (Joules):  50   Shock outcome:  No change in rhythm Attempt two:    Cardioversion mode:  Synchronous   Waveform:  Biphasic   Shock (Joules):  75   Shock outcome:  Conversion to normal sinus rhythm Post-procedure details:    Patient status:  Awake   Patient tolerance of procedure:  Tolerated well, no immediate complications .Sedation  Date/Time: 10/14/2022 4:16 PM  Performed by: Harvest Dark, MD Authorized by: Harvest Dark, MD   Consent:    Consent obtained:  Written   Consent given by:  Patient   Risks discussed:  Prolonged hypoxia resulting in organ damage, dysrhythmia and inadequate sedation Universal protocol:    Immediately prior to procedure, a time out was called: yes     Patient identity confirmed:  Verbally with patient and arm band Indications:    Procedure performed:  Cardioversion   Procedure necessitating sedation performed by:  Physician performing sedation Pre-sedation assessment:    Time since last food or drink:  Hours   ASA classification: class 3 - patient with severe systemic disease     Mallampati score:  II - soft palate, uvula, fauces visible   Neck mobility: normal     Pre-sedation assessments completed and reviewed: airway patency, cardiovascular function, mental status, nausea/vomiting and respiratory function     Pre-sedation assessment completed:  10/14/2022 4:00 PM Immediate pre-procedure details:  Reassessment: Patient reassessed immediately prior to procedure     Reviewed: vital signs     Verified: bag valve mask available, emergency equipment available, intubation equipment available, IV patency confirmed, oxygen available and suction available   Procedure details (see MAR for exact  dosages):    Preoxygenation:  Nasal cannula   Sedation:  Etomidate   Intended level of sedation: deep   Analgesia:  None   Intra-procedure monitoring:  Blood pressure monitoring, continuous capnometry, cardiac monitor, continuous pulse oximetry, frequent LOC assessments and frequent vital sign checks   Intra-procedure events: none     Total Provider sedation time (minutes):  20 Post-procedure details:    Post-sedation assessment completed:  10/14/2022 4:20 PM   Attendance: Constant attendance by certified staff until patient recovered     Recovery: Patient returned to pre-procedure baseline     Post-sedation assessments completed and reviewed: airway patency, cardiovascular function and mental status     Patient is stable for discharge or admission: yes     Procedure completion:  Tolerated well, no immediate complications    Repeat EKG after initial cardioversion at 50 J shows continued atrial fibrillation at 122 bpm with a narrow QRS, right axis deviation, nonspecific but no concerning ST changes.  Repeat EKG viewed and interpreted by myself after 75 J cardioversion shows a normal sinus rhythm at 80 bpm with a narrow QRS normal axis, normal intervals, no concerning ST changes.   I spoke with Dr. Rockey Situ after return to normal sinus rhythm.  He states given the remainder of her workup is largely nonrevealing if the patient feels well after she is able to wake up a bit and remains in normal sinus rhythm she would be safe to be discharged home and follow-up in the office.  If the patient needed to be admitted for monitoring then we would admit and cardiology would see in the hospital.  Patient remains in a normal sinus rhythm around 70 bpm.  Patient states she feels well.  I spoke with Dr. Rockey Situ of cardiology who believes the patient is safe for discharge home from his standpoint.  Patient strongly prefers to go home as well.  He will reach out to the patient to arrange for a follow-up  appointment at the EP cardiologist.  Patient agreeable to this plan of care and will call someone to come pick her up shortly.  Patient is awake alert oriented, no complaints currently.   Harvest Dark, MD 10/14/22 [phone]

## 2022-10-14 NOTE — Discharge Instructions (Signed)
You have been seen in the emergency department for atrial fibrillation.  You have undergone an electric cardioversion and your heart is now in a normal sinus rhythm once again.  Cardiologist will reach out to you in the morning to help arrange for follow-up.  Return to the emergency department if you develop any chest pain any rapid heart rates or any other symptom concerning to yourself.

## 2022-10-14 NOTE — ED Notes (Signed)
Pt was wheeled to lobby via wheelchair with all belongings and ambulated to chair to wait for friend to pick her up to take her home.  Pt in NAD.

## 2022-10-15 ENCOUNTER — Ambulatory Visit: Payer: Medicare Other | Admitting: Internal Medicine

## 2022-10-15 ENCOUNTER — Telehealth: Payer: Self-pay | Admitting: Cardiovascular Disease

## 2022-10-15 ENCOUNTER — Encounter: Payer: Self-pay | Admitting: Internal Medicine

## 2022-10-15 MED ORDER — ALPRAZOLAM 1 MG PO TABS: 1.0000 mg | ORAL_TABLET | Freq: Two times a day (BID) | ORAL | 5 refills | Status: DC | PRN

## 2022-10-15 NOTE — Telephone Encounter (Signed)
Pt is requesting call back to discuss appt. She was told to call and set up f/u appt after procedure for as soon as possible and she would like to make sure that the appt set with Christell Faith, P.A. on 01/03 is going to be acceptable. Please advise.

## 2022-10-15 NOTE — Telephone Encounter (Signed)
Okay to move the patient to one of my open slots on 12/29.  Please also schedule the patient to follow-up with Dr. Myles Gip with EP given recurrent A-fib and inability to use a beta-blocker or amiodarone.  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

## 2022-10-15 NOTE — Telephone Encounter (Signed)
Refilled

## 2022-10-15 NOTE — Telephone Encounter (Signed)
Spoke to pt. Appointment moved up to 12/29 @ 8:25 am Message sent to EP scheduling to arrange appointment with Dr. Myles Gip.

## 2022-10-15 NOTE — Telephone Encounter (Signed)
Pt post cardioversion and want to make sure her appointment date is appropriate as she was instructed to follow up asap

## 2022-10-18 ENCOUNTER — Encounter: Payer: Self-pay | Admitting: Cardiovascular Disease

## 2022-10-18 ENCOUNTER — Ambulatory Visit: Payer: Medicare Other | Attending: Cardiovascular Disease | Admitting: Cardiovascular Disease

## 2022-10-18 VITALS — BP 150/74 | HR 68 | Ht 60.0 in | Wt 183.0 lb

## 2022-10-18 DIAGNOSIS — I4891 Unspecified atrial fibrillation: Secondary | ICD-10-CM | POA: Insufficient documentation

## 2022-10-18 MED ORDER — DIPHENHYDRAMINE HCL 50 MG PO TABS: 50.0000 mg | ORAL_TABLET | Freq: Once | ORAL | 0 refills | Status: DC

## 2022-10-18 MED ORDER — PREDNISONE 50 MG PO TABS: ORAL_TABLET | ORAL | 0 refills | Status: DC

## 2022-10-18 NOTE — Progress Notes (Signed)
Cardiology Office Note:    Date:  10/18/2022   ID:  [place], Mccuen [birth year], MRN [id number]  PCP:  [name], MD   [facility] Cardiologist:  Ida Rogue, MD Electrophysiologist:  Melida Quitter, MD     Referring MD: [name], *    History of Present Illness:    Brenda [name] is a 71 y.o. female with a hx listed below pertinent for CAD, DM, HTN, persistent AF, CKD IIIb-IV referred for arrhythmia management.  She was diagnosed with atrial fibrillation in May 2022 and placed on metoprolol and eliquis.  She was started on amiodarone in June 2023.  In September 2023, she complained of lightheadedness and dizziness.  A Zio patch was placed that showed sinus pauses up to about 8 seconds, so her Coreg and amiodarone were discontinued.  Additionally, her TSH was elevated at 11.7.  Pacemaker placement was deferred with a reversible cause.  Monitor placed and follow-up did not show any atrial fibrillation or significant bradycardia.  She has had sinus bradycardia with metoprolol years ago.  She reports that she has been doing very well since wearing the monitor.  She records her heart rate 4 times a day, and these have been running between 70 and 90 bpm.  She notes that when she did have atrial fibrillation, her primary symptom was fatigue.  She did not have significant palpitations.  She could appreciate a regular rhythm during pulse checks.  She is a retired Marine scientist.  Past Medical History:  Diagnosis Date   Anxiety    Arthritis    Atrial fibrillation (HCC)    CLL (chronic lymphocytic leukemia) ([place])    Dx. 2019 Dr. 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 Particia Nearing first visit 11-2019    Past Surgical History:  Procedure Laterality Date   ABDOMINAL HYSTERECTOMY  1998   COLONOSCOPY     EYE SURGERY Bilateral    cataracts   EYE SURGERY Left    retina tear   FEMUR IM NAIL  10/25/2012   Procedure: INTRAMEDULLARY (IM) NAIL FEMORAL;  Surgeon: Mauri Pole, MD;  Location: [facility];  Service: Orthopedics;  Laterality: Left;   HIP SURGERY     Fracture L IM nail and 3 rods   left knee arthroscopy  12/2010   LYMPH NODE BIOPSY     axillary left 12-2018   REFRACTIVE SURGERY     skin cancer removed right and and lower forearm     squamoun in situ  TOTAL KNEE ARTHROPLASTY  12/20/2011   Procedure: TOTAL KNEE ARTHROPLASTY;  Surgeon: Gearlean Alf, MD;  Location: [facility];  Service: Orthopedics;  Laterality: Left;  Failed attempt at spinal    TOTAL SHOULDER ARTHROPLASTY Right 08/19/2016   Procedure: RIGHT TOTAL SHOULDER ARTHROPLASTY;  Surgeon: Justice Britain, MD;  Location: [facility];  Service: Orthopedics;  Laterality: Right;   TOTAL SHOULDER ARTHROPLASTY Left 01/10/2020   Procedure: TOTAL SHOULDER ARTHROPLASTY;  Surgeon: Justice Britain, MD;  Location: [facility];  Service: Orthopedics;  Laterality: Left;  178mn    Current Medications: Current Meds  Medication Sig   acetaminophen (TYLENOL) 500 MG tablet Take 500-1,000 mg by mouth every 6 (six) hours as needed for mild pain, moderate pain or fever.    ALPRAZolam (XANAX) 1 MG tablet Take 1 tablet (1 mg total) by mouth 2 (two) times  daily as needed for anxiety.   amoxicillin (AMOXIL) 500 MG capsule For dental appointment   apixaban (ELIQUIS) 5 MG TABS tablet Take 1 tablet (5 mg total) by mouth 2 (two) times daily.   BD PEN NEEDLE NANO 2ND GEN 32G X 4 MM MISC USE DAILY AS DIRECTED   Blood Glucose Monitoring Suppl (ONETOUCH VERIO) w/Device KIT Use as advised   buPROPion (WELLBUTRIN XL) 300 MG 24 hr tablet Take 1 tablet (300 mg total) by mouth daily.   Cholecalciferol (VITAMIN D3) 2000 units TABS Take 2,000 mcg by mouth daily.   Cyanocobalamin (B-12) 1000 MCG SUBL Place 1,000 mcg under the tongue every evening.    doxazosin (CARDURA) 2 MG tablet Take 1 tablet (2 mg total) by mouth daily after lunch.   esomeprazole (NEXIUM) 20 MG capsule Take 20 mg by mouth every Monday, Wednesday, and Friday.   ezetimibe (ZETIA) 10 MG tablet Take 1 tablet (10 mg total) by mouth daily.   glucose blood (ONETOUCH VERIO) test strip TEST BLOOD SUGAR THREE TIMES DAILY AS DIRECTED   insulin glargine (LANTUS SOLOSTAR) 100 UNIT/ML Solostar Pen Inject 18-20 Units into the skin daily.   iron polysaccharides (NIFEREX) 150 MG capsule Take 150 mg by mouth at bedtime.    JARDIANCE 10 MG TABS tablet TAKE 1 TABLET(10 MG) BY MOUTH DAILY BEFORE BREAKFAST   Lancet Devices (LANCING DEVICE) MISC Use as advised - Freestyle   Lancets (ONETOUCH DELICA PLUS [id number] MISC TEST BLOOD SUGAR THREE TIMES DAILY AS DIRECTED   levothyroxine (SYNTHROID) 25 MCG tablet Take 1 tablet (25 mcg total) by mouth daily before breakfast.   lisinopril (ZESTRIL) 20 MG tablet Take 1 tablet (20 mg total) by mouth 2 (two) times daily.   NONFORMULARY OR COMPOUNDED ITEM Apply 1 application topically 2 (two) times daily as needed (sun damage on face). FLUOROURACIL 5% + CALCIPOTRIENE 0.005%   simvastatin (ZOCOR) 40 MG tablet TAKE 1 TABLET(40 MG) BY MOUTH AT BEDTIME   triamcinolone cream (KENALOG) 0.1 % Apply 1 application topically 2 (two) times daily as needed (skin rash.).      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 [employer]    Employer: [employer]  Tobacco Use   Smoking status: Never   Smokeless tobacco: Never  Vaping  Use   Vaping Use: Never used  Substance and Sexual Activity   Alcohol use: No   Drug use: No   Sexual activity: Not Currently  Other Topics Concern   Not on file  Social History Narrative   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   Social Determinants of Health   Financial Resource Strain: Low Risk  (03/10/2022)   Overall Financial Resource Strain (CARDIA)    Difficulty of Paying Living Expenses: Not hard at all  Food Insecurity: No Food Insecurity (08/24/2022)   Hunger Vital Sign    Worried About Running Out of Food in the Last Year: Never true    Ran Out of Food in the Last Year: Never true  Transportation Needs: No Transportation Needs (08/24/2022)   PRAPARE - Hydrologist (Medical): No    Lack of Transportation (Non-Medical): No  Physical Activity: Inactive (03/10/2022)   Exercise Vital Sign    Days of Exercise per Week: 0 days    Minutes of Exercise per Session: 0 min  Stress: No Stress Concern Present (03/10/2022)   [place]    Feeling of Stress : Not at all  Social Connections: Moderately Isolated (03/10/2022)   Social Connection and Isolation Panel [NHANES]    Frequency of Communication with Friends and Family: More than three times a week    Frequency of Social Gatherings with Friends and Family: More than three times a week    Attends Religious Services: More than 4 times per year    Active Member of [employer] or Organizations:  No    Attends Archivist Meetings: Never    Marital Status: Never married     Family History: The patient's family history includes Cancer in her father and mother. There is no history of Breast cancer.  ROS:   Please see the history of present illness.    All other systems reviewed and are negative.  EKGs/Labs/Other Studies Reviewed:     EKG:  Last EKG results: Sinus rhythm 68 bpm   Recent Labs: 06/16/2022: ALT 10 09/27/2022: TSH 22.801 10/14/2022: BUN 43; Creatinine, Ser 2.17; Hemoglobin 13.3; Magnesium 2.0; Platelets 145; Potassium 4.6; Sodium 136       Physical Exam:    VS:  BP (!) 150/74   Pulse 68   Ht 5' (1.524 m)   Wt 183 lb (83 kg)   SpO2 97%   BMI 35.74 kg/m     Wt Readings from Last 3 Encounters:  10/18/22 183 lb (83 kg)  10/14/22 181 lb (82.1 kg)  10/11/22 183 lb (83 kg)     GEN:  Well nourished, well developed in no acute distress CARDIAC: RRR, no murmurs, rubs, gallops RESPIRATORY:  Normal work of breathing MUSCULOSKELETAL: no edema    ASSESSMENT & PLAN:    Paroxysmal atrial fibrillation: she notes fatigue when in AF. She was recently admitted for cardioversion due to recurrence of AF with RVR. She is not a candidate for most AADs due to renal dysfunction. Due to recurrence of AF necessitating cardioversion, I think we will need to proceed with ablation. She will need to continue to lose weight. We discussed the indication, rationale, logistics, anticipated benefits, and potential risks of the ablation procedure including but not limited to -- bleed at the groin access site, chest pain, damage to nearby organs such as the diaphragm, lungs, or esophagus, need for a drainage tube, or prolonged hospitalization. I explained that the risk  for stroke, heart attack, need for open chest surgery, or even death is very low but not zero. she  expressed understanding and wishes to proceed.  I will see her again in 6 months. Continue eliquis. Obesity: I  reinforced her weight loss efforts. DM II CKD IV: precludes use of most AADs.          Medication Adjustments/Labs and Tests Ordered: Current medicines are reviewed at length with the patient today.  Concerns regarding medicines are outlined above.  Orders Placed This Encounter  Procedures   EKG 12-Lead   No orders of the defined types were placed in this encounter.    Signed, Melida Quitter, MD  10/18/2022 11:08 AM    [place]

## 2022-10-18 NOTE — Addendum Note (Signed)
Addended by: Bernestine Amass on: 10/18/2022 11:18 AM   Modules accepted: Orders

## 2022-10-18 NOTE — Patient Instructions (Signed)
Medication Instructions:  Your physician recommends that you continue on your current medications as directed. Please refer to the Current Medication list given to you today.  *If you need a refill on your cardiac medications before your next appointment, please call your pharmacy*  Lab Work: BMET and CBC prior to CT scan and ablation (see instruction letter) If you have labs (blood work) drawn today and your tests are completely normal, you will receive your results only by: [place] (if you have MyChart) OR A paper copy in the mail If you have any lab test that is abnormal or we need to change your treatment, we will call you to review the results.  Testing/Procedures: Your physician has requested that you have cardiac CT. Cardiac computed tomography (CT) is a painless test that uses an x-ray machine to take clear, detailed pictures of your heart. For further information please visit [email or web address]. Please follow instruction sheet as given.  Your physician has recommended that you have an ablation. Catheter ablation is a medical procedure used to treat some cardiac arrhythmias (irregular heartbeats). During catheter ablation, a long, thin, flexible tube is put into a blood vessel in your groin (upper thigh), or neck. This tube is called an ablation catheter. It is then guided to your heart through the blood vessel. Radio frequency waves destroy small areas of heart tissue where abnormal heartbeats may cause an arrhythmia to start. Please see the instruction sheet given to you today.  Follow-Up: At [facility], you and your health needs are our priority.  As part of our continuing mission to provide you with exceptional heart care, we have created designated Provider Care Teams.  These Care Teams include your primary Cardiologist (physician) and Advanced Practice Providers (APPs -  Physician Assistants and Nurse Practitioners) who all work together to provide you with the  care you need, when you need it.  Your next appointment:   We will call you to arrange your follow up appointments.   Important Information About Sugar

## 2022-10-23 ENCOUNTER — Other Ambulatory Visit: Payer: Self-pay | Admitting: Internal Medicine

## 2022-10-26 NOTE — Progress Notes (Signed)
Cardiology Office Note    Date:  10/29/2022   ID:  Fatin, Bachicha 05/20/1951, MRN [id number]  PCP:  [name], MD  Cardiologist:  Ida Rogue, MD  Electrophysiologist:  Melida Quitter, MD   Chief Complaint: Follow-up  History of Present Illness:   Brenda [name] is a 71 y.o. female with history of coronary artery calcification by CT imaging, persistent Afib s/p DCCV 10/14/2022, sinus pauses, DM2, HLD, CLL, CKD stage IIIb-IV, hypothyroidism, morbid obesity, and anemia who presents for follow up of A-fib.   Calcium score in 12/2015 of 1031, which was the 97th percentile, with diffuse calcification in the entire LAD and RCA. Noncardiac overread also noted multiple pulmonary nodules scattered throughout the lungs bilaterally as noted below, along with a mildly enlarged right axillary lymph node with follow up imaging in 11/2017 showing stable pulmonary nodules as well as borderline to mildly enlarged axillary, subpectoral, and mediastinal lymph nodes. Subsequent PET scan noted to be consistent with CLL, confirmed by biopsy.   She was diagnosed with new onset Afib at office visit in 03/2021, started on Eliquis, and was found to be in sinus rhythm in follow up in 04/2021. Echo from 05/2021 demonstrated an EF of 60-65%, no RWMA, normal LV diastolic function parameters, normal RV systolic function and ventricular cavity size, and mild mitral regurgitation.   She was seen in the office on 04/19/2022 and noted to be back in Afib with RVR, despite escalation of metoprolol via My Chart prior to the OV. She was started on amiodarone and continued on metoprolol and Eliquis.     She was seen in the office in 05/2022 and was without symptoms of angina or decompensation.  Following the initiation of amiodarone, the next morning she noted her heart rates were down in the upper 40s bpm.  At times, she was having to hold her metoprolol due to heart rates less than 60 bpm.  EKG showed sinus  rhythm.  Blood pressure readings were elevated at home and felt to be exacerbated by intermittent holding of metoprolol.  She was transitioned from Toprol-XL to carvedilol.  Subsequent BP readings from home remained on the higher side leading to titration of carvedilol and lisinopril.   She was seen in the office on 07/07/2022 and was without symptoms of angina or decompensation.  She continued to note dizziness that is typically worse in the mornings approximately 1 to 2 hours after taking her morning medications.  During this timeframe, her BP was frequently on the low side ranging from the low 892J to 19E systolic with a rare reading of 87 systolic.  Heart rates are typically in the mid to upper 50s to mid 60s bpm.  Due to dizziness, Zio patch was placed.    We received a call report from [employer] on 07/30/2022 with noted sinus pauses with the longest lasting 7.9 seconds. Given this, we recommended she go to the [facility] ED for evaluation by EP. At [facility], it was recommended she hold Coreg and amiodarone. Her TSH was also noted to be elevated at 11.721. Given these factors, implantation of pacer was deferred at that time with the patient being discharged on repeat Zio patch to evaluate for high grade AV block/prolonged pauses and to quantify Afib burden off beta blocker and amiodarone.   She was seen in the office on 08/13/2022 and was without symptoms of angina or decompensation.  She had not had any further dizziness off carvedilol and amiodarone.  No presyncope or syncope.  She felt like she had improved energy off beta-blocker as well.  ZIO Suite was reviewed and showed no further sinus pauses, evidence of high-grade AV block, or A-fib.  She followed up with the EP on 08/30/2022 with continued monitoring recommended.  She was seen on 10/11/2022 and was without symptoms of angina or decompensation.  She was maintaining sinus rhythm.  She was started on levothyroxine 25 mcg daily while she awaited  appointment with endocrinology.  Following this visit, she reported tachypalpitations and was seen on 10/14/2022 and noted to be symptomatic A-fib with RVR.  Medical therapy with AV nodal blocking medications was not a good option given potential for severe bradycardia with noted sinus pauses of up to 8 seconds on prior monitoring.  In this setting, she was transferred to the ED where she underwent successful DCCV.  High-sensitivity troponin 25.  She has since followed up with the EP 10/18/2022 with plans to undergo A-fib ablation on 01/14/2023.  She comes in doing well from a cardiac perspective and is without symptoms of angina or decompensation.  No further symptoms of palpitations consistent with A-fib recurrence.  She is maintaining sinus rhythm.  She is adherent and tolerating cardiac medications without issues.  No presyncope or syncope.  She does report being able to take IV contrast for CT scan without adverse event without premedication.  She indicates the only reason this is listed as a "allergy" is because of her underlying renal dysfunction.  She has reservations about taking prednisone prior to her CT scan.  Lastly, there was some frustration regarding timely appointment for recurrent A-fib.   Labs independently reviewed: 10/2022 - magnesium 2.0, potassium 4.6, BUN 43, serum creatinine 2.17, Hgb 13.3, PLT 145 09/2022 - TSH 22.801, free T4 low 0.55, total T3 normal 07/2022 - TC 104, TG 193, HDL 21, LDL 44, TSH 11.721, A1c 6.3 06/2022 - albumin 4.4, AST/ALT not elevated 04/2022 - TSH normal  Past Medical History:  Diagnosis Date   Anxiety    Arthritis    Atrial fibrillation (HCC)    CLL (chronic lymphocytic leukemia) ([place])    Dx. 2019 Dr. 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 Particia Nearing first visit 11-2019    Past Surgical History:  Procedure Laterality Date   ABDOMINAL HYSTERECTOMY  1998   COLONOSCOPY     EYE SURGERY Bilateral    cataracts   EYE SURGERY Left    retina tear   FEMUR IM NAIL  10/25/2012   Procedure: INTRAMEDULLARY (IM)  NAIL FEMORAL;  Surgeon: Mauri Pole, MD;  Location: [facility];  Service: Orthopedics;  Laterality: Left;   HIP SURGERY     Fracture L IM nail and 3 rods   left knee arthroscopy  12/2010   LYMPH NODE BIOPSY     axillary left 12-2018   REFRACTIVE SURGERY     skin cancer removed right and and lower forearm     squamoun in situ   TOTAL KNEE ARTHROPLASTY  12/20/2011   Procedure: TOTAL KNEE ARTHROPLASTY;  Surgeon: Gearlean Alf, MD;  Location: [facility];  Service: Orthopedics;  Laterality: Left;  Failed attempt at spinal    TOTAL SHOULDER ARTHROPLASTY Right 08/19/2016   Procedure: RIGHT TOTAL SHOULDER ARTHROPLASTY;  Surgeon: Justice Britain, MD;  Location: [place];  Service: Orthopedics;  Laterality: Right;   TOTAL SHOULDER ARTHROPLASTY Left 01/10/2020   Procedure: TOTAL SHOULDER ARTHROPLASTY;  Surgeon: Justice Britain, MD;  Location: [facility];  Service: Orthopedics;  Laterality: Left;  142mn    Current Medications: Current Meds  Medication Sig   acetaminophen (TYLENOL) 500 MG tablet Take 500-1,000 mg by mouth every 6 (six) hours as needed for mild pain, moderate pain or fever.    ALPRAZolam (XANAX)  1 MG tablet Take 1 tablet (1 mg total) by mouth 2 (two) times daily as needed for anxiety.   amoxicillin (AMOXIL) 500 MG capsule For dental appointment   apixaban (ELIQUIS) 5 MG TABS tablet Take 1 tablet (5 mg total) by mouth 2 (two) times daily.   BD PEN NEEDLE NANO 2ND GEN 32G X 4 MM MISC USE DAILY AS DIRECTED   Blood Glucose Monitoring Suppl (ONETOUCH VERIO) w/Device KIT Use as advised   buPROPion (WELLBUTRIN XL) 300 MG 24 hr tablet Take 1 tablet (300 mg total) by mouth daily.   Cholecalciferol (VITAMIN D3) 2000 units TABS Take 2,000 mcg by mouth daily.   Cyanocobalamin (B-12) 1000 MCG SUBL Place 1,000 mcg under the tongue every evening.    doxazosin (CARDURA) 2 MG tablet Take 1 tablet (2 mg total) by mouth daily after lunch.   esomeprazole (NEXIUM) 20 MG capsule Take 20 mg by mouth every Monday, Wednesday, and Friday.   ezetimibe (ZETIA) 10 MG tablet Take 1 tablet (10 mg total) by mouth daily.   glucose blood (ONETOUCH VERIO) test strip TEST BLOOD SUGAR THREE TIMES DAILY AS DIRECTED   insulin glargine (LANTUS SOLOSTAR) 100 UNIT/ML Solostar Pen Inject 18-20 Units into the skin daily.   iron polysaccharides (NIFEREX) 150 MG capsule Take 150 mg by mouth at bedtime.    JARDIANCE 10 MG TABS tablet TAKE 1 TABLET(10 MG) BY MOUTH DAILY BEFORE BREAKFAST   Lancet Devices (LANCING DEVICE) MISC Use as advised - Freestyle   Lancets (ONETOUCH DELICA PLUS [id number] MISC TEST BLOOD SUGAR THREE TIMES DAILY AS DIRECTED   levothyroxine (SYNTHROID) 25 MCG tablet Take 1 tablet (25 mcg total) by mouth daily before breakfast.   lisinopril (ZESTRIL) 20 MG tablet Take 1 tablet (20 mg total) by mouth 2 (two) times daily.   NONFORMULARY OR COMPOUNDED ITEM Apply 1 application topically 2 (two) times daily as needed (sun damage on face). FLUOROURACIL 5% + CALCIPOTRIENE 0.005%   predniSONE (DELTASONE) 50 MG tablet Take 1 tablet 13 hours prior to CT scan, take 1 tablet 7 hours prior to CT scan, take 1 tablet 1 hour prior  to CT scan   simvastatin (ZOCOR) 40 MG tablet TAKE 1 TABLET(40 MG) BY MOUTH AT BEDTIME   triamcinolone cream (KENALOG) 0.1 %  Apply 1 application topically 2 (two) times daily as needed (skin rash.).     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    Employer: [employer]  Tobacco Use   Smoking status: Never   Smokeless tobacco: Never  Vaping Use   Vaping Use: Never used  Substance and Sexual Activity   Alcohol use: No   Drug use: No   Sexual activity: Not Currently  Other Topics Concern   Not on file  Social History Narrative   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   Social Determinants of Health   Financial Resource Strain: [place]  (03/10/2022)   Overall Financial Resource Strain (CARDIA)    Difficulty of Paying Living Expenses: Not hard at all  Food Insecurity: No Food Insecurity (08/24/2022)   Hunger Vital Sign    Worried About Running Out of Food in the Last Year: Never true    Landfall in the Last Year: Never true  Transportation Needs: No Transportation Needs (08/24/2022)   PRAPARE - Hydrologist (Medical): No    Lack of Transportation (Non-Medical): No  Physical Activity: Inactive (03/10/2022)   Exercise Vital Sign    Days of Exercise per Week: 0 days    Minutes of Exercise per Session: 0 min  Stress: No Stress Concern Present (03/10/2022)   [place]    Feeling of Stress : Not at all  Social Connections: Moderately Isolated (03/10/2022)   Social Connection and Isolation Panel [NHANES]    Frequency of Communication with Friends and  Family: More than three times a week    Frequency of Social Gatherings with Friends and Family: More than three times a week    Attends Religious Services: More than 4 times per year    Active Member of [employer] or Organizations: No    Attends Archivist Meetings: Never    Marital Status: Never married     Family History:  The patient's family history includes Cancer in her father and mother. There is no history of Breast cancer.  ROS:   12-point review of systems is negative unless otherwise noted in the HPI.   EKGs/Labs/Other Studies Reviewed:    Studies reviewed were summarized above. The additional studies were reviewed today:  Zio patch 08/2022: Preliminary read - Patient had a min HR of 45 bpm, max HR of 131 bpm, and avg HR of 66 bpm. Predominant underlying rhythm was Sinus Rhythm. First Degree AV Block was present. 4 Supraventricular Tachycardia runs occurred, the run with the fastest interval lasting 5 beats  with a max rate of 130 bpm, the longest lasting 9 beats with an avg rate of 104 bpm. Isolated SVEs were rare (<1.0%, 1280), SVE Couplets were rare (<1.0%, 88), and SVE Triplets were rare (<1.0%, 36). Isolated VEs were rare (<1.0%), and no VE Couplets or  VE Triplets were present. __________   Elwyn Reach patch 07/2022: Normal sinus rhythm Patient had a min HR of 24 bpm, max HR of 129 bpm, and avg HR of 63 bpm.  Atrial Fibrillation occurred (27% burden), ranging from 24-129 bpm (avg of 76 bpm), the longest lasting 3 days 19 hours with an avg rate of 76 bpm. 66 Pauses occurred, the longest lasting 7.9 secs (8 bpm).  Atrial Fibrillation and Pause were detected within +/- 45 seconds of symptomatic patient event(s).    Isolated SVEs were rare (<1.0%), SVE Couplets were rare (<1.0%), and SVE Triplets were rare (<1.0%). Isolated VEs were rare (<1.0%), and no VE Couplets or VE Triplets were present.    2D echo 05/22/2021: 1. Left ventricular ejection fraction, by estimation, is  60 to 65%. The  left ventricle has normal function. The left ventricle has no regional  wall motion abnormalities. Left ventricular diastolic parameters were  normal.   2. Right ventricular systolic function is normal. The right ventricular  size is normal.   3. The mitral valve is normal in structure. Mild mitral valve  regurgitation. __________   Calcium score 01/13/2016:   Noncardiac over read: 1. There multiple pulmonary nodules scattered throughout the lungs bilaterally, the largest of which measures up to 9 mm in the left lower lobe. Non-contrast chest CT at 3-6 months is recommended. If the nodules are stable at time of repeat CT, then future CT at 18-24 months (from today's scan) is considered optional for low-risk patients, but is recommended for high-risk patients. This recommendation follows the consensus statement: Guidelines for Management of Incidental Pulmonary Nodules Detected on CT Images:From the Fleischner Society 2017; published online before print (10.1148/radiol.3220254270). 2. Mildly enlarged right axillary lymph node measuring 1 cm in short axis. This is nonspecific, but correlation with mammography is suggested in the near future. If mammography is abnormal, further evaluation with contrast enhanced CT of the chest, abdomen and pelvis could be considered to evaluate for metastatic disease. These results will be called to the ordering clinician or representative by the Radiologist Assistant, and communication documented in the PACS or zVision Dashboard.   Cardiac overread: IMPRESSION: Coronary calcium score of 1031. This was 97th percentile for age and sex matched control. Consider f/u perfusion study to further assess CAD See radiology report regarding right axillary lymph node and pulmonary nodules   EKG:  EKG is ordered today.  The EKG ordered today demonstrates NSR, 74 bpm, poor R wave progression along the precordial leads, no acute ST-T  changes  Recent Labs: 06/16/2022: ALT 10 09/27/2022: TSH 22.801 10/14/2022: BUN 43; Creatinine, Ser 2.17; Hemoglobin 13.3; Magnesium 2.0; Platelets 145; Potassium 4.6; Sodium 136  Recent Lipid Panel    Component Value Date/Time   CHOL 104 07/31/2022 0500   CHOL 124 07/20/2021 0915   TRIG 193 (H) 07/31/2022 0500   HDL 21 (L) 07/31/2022 0500   HDL 29 (L) 07/20/2021 0915   CHOLHDL 5.0 07/31/2022 0500   VLDL 39 07/31/2022 0500   LDLCALC 44 07/31/2022 0500   LDLCALC 73 07/20/2021 0915   LDLDIRECT 55.0 05/15/2020 0827    PHYSICAL EXAM:    VS:  BP 112/62 (BP Location: Left Arm, Patient Position: Sitting, Cuff Size: Normal)   Pulse 74   Ht 5' (1.524 m)   Wt 182 lb 3.2 oz (82.6 kg)   SpO2 97%   BMI 35.58 kg/m   BMI: Body mass index is 35.58 kg/m.  Physical Exam Vitals reviewed.  Constitutional:      Appearance: She is well-developed.  HENT:     Head: Normocephalic and atraumatic.  Eyes:     General:        Right eye:  No discharge.        Left eye: No discharge.  Neck:     Vascular: No JVD.  Cardiovascular:     Rate and Rhythm: Normal rate and regular rhythm.     Pulses:          Posterior tibial pulses are 2+ on the right side and 2+ on the left side.     Heart sounds: Normal heart sounds, S1 normal and S2 normal. Heart sounds not distant. No midsystolic click and no opening snap. No murmur heard.    No friction rub.  Pulmonary:     Effort: Pulmonary effort is normal. No respiratory distress.     Breath sounds: Normal breath sounds. No decreased breath sounds, wheezing or rales.  Chest:     Chest wall: No tenderness.  Abdominal:     General: There is no distension.  Musculoskeletal:     Cervical back: Normal range of motion.     Right lower leg: No edema.     Left lower leg: No edema.  Skin:    General: Skin is warm and dry.     Nails: There is no clubbing.  Neurological:     Mental Status: She is alert and oriented to person, place, and time.  Psychiatric:         Speech: Speech normal.        Behavior: Behavior normal.        Thought Content: Thought content normal.        Judgment: Judgment normal.     Wt Readings from Last 3 Encounters:  10/29/22 182 lb 3.2 oz (82.6 kg)  10/18/22 183 lb (83 kg)  10/14/22 181 lb (82.1 kg)     ASSESSMENT & PLAN:   Persistent A-fib: Maintaining sinus rhythm following recent DCCV earlier this month.  Unable to use AV nodal blocking medications or amiodarone given prior history of significant bradycardia and sinus pauses.  Overall, management of A-fib has been somewhat difficult in this setting.  She is scheduled for an A-fib ablation in 12/2022.  She is somewhat concerned regarding the date of this procedure and wonders what she should do if she redevelops A-fib.  If she does have recurrence of A-fib, options are quite limited as we are unable to escalate rate control strategy given prior bradycardia and sinus pauses.  For recurrent A-fib, she would need to undergo DCCV.  If she does have recurrent A-fib, I have asked her to send me a MyChart message directly and I will get back with her as soon as possible with recommendations.  CHA2DS2-VASc at least 5.  She remains on apixaban 5 mg twice daily and does not meet reduced dosing criteria.  With regards to CT needed prior to A-fib ablation, she reports she does not have an allergy to IV contrast and has tolerated contrast previously without issue without premedication.  She indicates this is listed as an allergy secondary to her underlying renal dysfunction.  She does have reservations about taking prednisone prior to CT scan.  Our clinical team will reach out to our EP team for further recommendations, as they have ordered to the study.  Sinus pauses: Exacerbated by beta-blocker and amiodarone.  Repeat outpatient cardiac monitoring showed no further pauses off these medications.  Evaluated by EP with no indication for PPM implantation.  Continue to avoid AV nodal blocking  medications.  Follow-up with EP as directed.  Coronary artery calcification/HLD: No symptoms concerning for angina.  She remains on  apixaban in place of aspirin to minimize bleeding risk, given A-fib.  LDL 73.  She remains on simvastatin and ezetimibe.  No indication for ischemic testing at this time.  HTN: Blood pressure is well-controlled in the office today.  No changes in medical therapy.  CKD stage IIIb-IV: She will need to pre and post hydrate following CT scan.  Followed by nephrology.  Hypothyroidism: On levothyroxine.  Follow-up with endocrinology.   Disposition: F/u with Dr. Rockey Situ or an APP in 4 months, and EP as directed.   Medication Adjustments/Labs and Tests Ordered: Current medicines are reviewed at length with the patient today.  Concerns regarding medicines are outlined above. Medication changes, Labs and Tests ordered today are summarized above and listed in the Patient Instructions accessible in Encounters.   Signed, Christell Faith, PA-C 10/29/2022 1:57 PM     [place], [place] [address] [phone]

## 2022-10-27 ENCOUNTER — Encounter: Payer: Self-pay | Admitting: Internal Medicine

## 2022-10-29 ENCOUNTER — Encounter: Payer: Self-pay | Admitting: Physician Assistant

## 2022-10-29 ENCOUNTER — Ambulatory Visit: Payer: Medicare Other | Attending: Physician Assistant | Admitting: Physician Assistant

## 2022-10-29 ENCOUNTER — Telehealth: Payer: Self-pay | Admitting: *Deleted

## 2022-10-29 VITALS — BP 112/62 | HR 74 | Ht 60.0 in | Wt 182.2 lb

## 2022-10-29 DIAGNOSIS — E785 Hyperlipidemia, unspecified: Secondary | ICD-10-CM | POA: Insufficient documentation

## 2022-10-29 DIAGNOSIS — N1832 Chronic kidney disease, stage 3b: Secondary | ICD-10-CM | POA: Insufficient documentation

## 2022-10-29 DIAGNOSIS — I2584 Coronary atherosclerosis due to calcified coronary lesion: Secondary | ICD-10-CM

## 2022-10-29 DIAGNOSIS — E039 Hypothyroidism, unspecified: Secondary | ICD-10-CM | POA: Insufficient documentation

## 2022-10-29 DIAGNOSIS — I455 Other specified heart block: Secondary | ICD-10-CM | POA: Diagnosis not present

## 2022-10-29 DIAGNOSIS — I2510 Atherosclerotic heart disease of native coronary artery without angina pectoris: Secondary | ICD-10-CM | POA: Insufficient documentation

## 2022-10-29 DIAGNOSIS — I4819 Other persistent atrial fibrillation: Secondary | ICD-10-CM | POA: Insufficient documentation

## 2022-10-29 NOTE — Telephone Encounter (Signed)
Confirmed Dr. Karel Jarvis nurse and requested that she please call me to discuss this patient.

## 2022-10-29 NOTE — Telephone Encounter (Signed)
Patient here in the office today to see our APP. Provider requested that I reach out to Dr. Karel Jarvis nurse for discussion of patients prednisone before her CT.

## 2022-10-29 NOTE — Patient Instructions (Signed)
Medication Instructions:  No changes at this time.   *If you need a refill on your cardiac medications before your next appointment, please call your pharmacy*   Lab Work: None  If you have labs (blood work) drawn today and your tests are completely normal, you will receive your results only by: [place] (if you have MyChart) OR A paper copy in the mail If you have any lab test that is abnormal or we need to change your treatment, we will call you to review the results.   Testing/Procedures: None   Follow-Up: At [facility], you and your health needs are our priority.  As part of our continuing mission to provide you with exceptional heart care, we have created designated Provider Care Teams.  These Care Teams include your primary Cardiologist (physician) and Advanced Practice Providers (APPs -  Physician Assistants and Nurse Practitioners) who all work together to provide you with the care you need, when you need it.   Your next appointment:   4 month(s)  The format for your next appointment:   In Person  Provider:   You may see Ida Rogue, MD or Christell Faith PA-C    Important Information About Sugar

## 2022-10-29 NOTE — Telephone Encounter (Signed)
Spoke with Percival Spanish RN the nurse for Dr. Myles Gip. We discussed patients hesitations regarding the prednisone and that she does not want to take it. She also reports that she has had other CT scans with no allergic reactions to contrast and the only reason it is listed in her chart is because it had been due to her kidneys. She did see [place] here in clinic today and provider requested that I reach out to Dr. Karel Jarvis nurse to further elaborate on this matter. She verbalized understanding and will review chart.

## 2022-11-02 ENCOUNTER — Encounter: Payer: Self-pay | Admitting: Internal Medicine

## 2022-11-02 ENCOUNTER — Ambulatory Visit (INDEPENDENT_AMBULATORY_CARE_PROVIDER_SITE_OTHER): Payer: Medicare Other | Admitting: Internal Medicine

## 2022-11-02 VITALS — BP 130/62 | HR 96 | Temp 97.7°F | Ht 60.0 in | Wt 182.0 lb

## 2022-11-02 DIAGNOSIS — T462X5A Adverse effect of other antidysrhythmic drugs, initial encounter: Secondary | ICD-10-CM

## 2022-11-02 DIAGNOSIS — C9110 Chronic lymphocytic leukemia of B-cell type not having achieved remission: Secondary | ICD-10-CM

## 2022-11-02 DIAGNOSIS — E064 Drug-induced thyroiditis: Secondary | ICD-10-CM

## 2022-11-02 NOTE — Assessment & Plan Note (Signed)
Recent increase without change on pathology smear. We discussed repeat and she will defer for today she is getting labs upcoming. Suspect the A fib/RVR did cause stress on her system causing the elevation.

## 2022-11-02 NOTE — Progress Notes (Signed)
   Subjective:   Patient ID: Brenda [name], female    DOB: 1951-04-24, 72 y.o.   MRN: [id number]  HPI The patient is a 72 YO female coming in for follow up. ER visit for A fib with rvr recently and labs with slight worsening renal function and elevated WBC compared to her baseline (hx CLL). Planned for A fib ablation March 2024.   Review of Systems  Constitutional: Negative.   HENT: Negative.    Eyes: Negative.   Respiratory:  Negative for cough, chest tightness and shortness of breath.   Cardiovascular:  Negative for chest pain, palpitations and leg swelling.  Gastrointestinal:  Negative for abdominal distention, abdominal pain, constipation, diarrhea, nausea and vomiting.  Musculoskeletal:  Positive for back pain.  Skin: Negative.   Neurological: Negative.   Psychiatric/Behavioral: Negative.      Objective:  Physical Exam Constitutional:      Appearance: She is well-developed. She is obese.  HENT:     Head: Normocephalic and atraumatic.  Cardiovascular:     Rate and Rhythm: Normal rate and regular rhythm.  Pulmonary:     Effort: Pulmonary effort is normal. No respiratory distress.     Breath sounds: Normal breath sounds. No wheezing or rales.  Abdominal:     General: Bowel sounds are normal. There is no distension.     Palpations: Abdomen is soft.     Tenderness: There is no abdominal tenderness. There is no rebound.  Musculoskeletal:        General: Tenderness present.     Cervical back: Normal range of motion.  Skin:    General: Skin is warm and dry.  Neurological:     Mental Status: She is alert and oriented to person, place, and time.     Coordination: Coordination normal.     Vitals:   11/02/22 0816  BP: 130/62  Pulse: 96  Temp: 97.7 F (36.5 C)  TempSrc: Oral  SpO2: 92%  Weight: 182 lb (82.6 kg)  Height: 5' (1.524 m)    Assessment & Plan:  Visit time 20 minutes in face to face communication with patient and coordination of care, additional 15  minutes spent in record review, coordination or care, ordering tests, communicating/referring to other healthcare professionals, documenting in medical records all on the same day of the visit for total time 35 minutes spent on the visit.

## 2022-11-02 NOTE — Assessment & Plan Note (Signed)
She is now taking levothyroxine 25 mcg daily since November and has repeat labs planned with endo in a few weeks. She is now off amiodarone but since this has extended half life may still be in her system.

## 2022-11-02 NOTE — Patient Instructions (Signed)
We will not check labs today.

## 2022-11-03 ENCOUNTER — Ambulatory Visit: Payer: Medicare Other | Admitting: Physician Assistant

## 2022-11-11 ENCOUNTER — Ambulatory Visit
Admission: RE | Admit: 2022-11-11 | Discharge: 2022-11-11 | Disposition: A | Discharge status: Home / Self Care | Payer: Medicare Other | Source: Ambulatory Visit | Attending: Internal Medicine | Admitting: Internal Medicine

## 2022-11-11 DIAGNOSIS — Z1231 Encounter for screening mammogram for malignant neoplasm of breast: Secondary | ICD-10-CM | POA: Diagnosis not present

## 2022-11-17 ENCOUNTER — Other Ambulatory Visit
Admission: RE | Admit: 2022-11-17 | Discharge: 2022-11-17 | Disposition: A | Discharge status: Home / Self Care | Payer: Medicare Other | Source: Ambulatory Visit | Attending: Physician Assistant | Admitting: Physician Assistant

## 2022-11-17 DIAGNOSIS — I4819 Other persistent atrial fibrillation: Secondary | ICD-10-CM

## 2022-11-17 DIAGNOSIS — I2510 Atherosclerotic heart disease of native coronary artery without angina pectoris: Secondary | ICD-10-CM | POA: Diagnosis not present

## 2022-11-17 DIAGNOSIS — E785 Hyperlipidemia, unspecified: Secondary | ICD-10-CM | POA: Diagnosis not present

## 2022-11-17 DIAGNOSIS — I2584 Coronary atherosclerosis due to calcified coronary lesion: Secondary | ICD-10-CM | POA: Insufficient documentation

## 2022-11-17 DIAGNOSIS — I455 Other specified heart block: Secondary | ICD-10-CM | POA: Insufficient documentation

## 2022-11-17 DIAGNOSIS — N1832 Chronic kidney disease, stage 3b: Secondary | ICD-10-CM | POA: Diagnosis not present

## 2022-11-17 DIAGNOSIS — E039 Hypothyroidism, unspecified: Secondary | ICD-10-CM | POA: Diagnosis not present

## 2022-11-17 LAB — TSH: TSH: 8.742 u[IU]/mL — ABNORMAL HIGH (ref 0.350–4.500)

## 2022-11-17 LAB — T4, FREE: Free T4: 0.77 ng/dL (ref 0.61–1.12)

## 2022-11-18 LAB — T3, FREE: T3, Free: 3 pg/mL (ref 2.0–4.4)

## 2022-11-26 ENCOUNTER — Encounter: Payer: Self-pay | Admitting: Internal Medicine

## 2022-11-26 ENCOUNTER — Ambulatory Visit (INDEPENDENT_AMBULATORY_CARE_PROVIDER_SITE_OTHER): Payer: Medicare Other | Admitting: Internal Medicine

## 2022-11-26 VITALS — BP 118/70 | HR 94 | Ht 60.0 in | Wt 181.4 lb

## 2022-11-26 DIAGNOSIS — E1165 Type 2 diabetes mellitus with hyperglycemia: Secondary | ICD-10-CM | POA: Diagnosis not present

## 2022-11-26 DIAGNOSIS — E785 Hyperlipidemia, unspecified: Secondary | ICD-10-CM

## 2022-11-26 DIAGNOSIS — E1169 Type 2 diabetes mellitus with other specified complication: Secondary | ICD-10-CM

## 2022-11-26 DIAGNOSIS — I2584 Coronary atherosclerosis due to calcified coronary lesion: Secondary | ICD-10-CM | POA: Diagnosis not present

## 2022-11-26 DIAGNOSIS — Z6835 Body mass index (BMI) 35.0-35.9, adult: Secondary | ICD-10-CM | POA: Diagnosis not present

## 2022-11-26 DIAGNOSIS — E032 Hypothyroidism due to medicaments and other exogenous substances: Secondary | ICD-10-CM | POA: Diagnosis not present

## 2022-11-26 DIAGNOSIS — I2510 Atherosclerotic heart disease of native coronary artery without angina pectoris: Secondary | ICD-10-CM

## 2022-11-26 LAB — POCT GLYCOSYLATED HEMOGLOBIN (HGB A1C): Hemoglobin A1C: 6.1 % — AB (ref 4.0–5.6)

## 2022-11-26 MED ORDER — LANTUS SOLOSTAR 100 UNIT/ML ~~LOC~~ SOPN: 14.0000 [IU] | PEN_INJECTOR | Freq: Every day | SUBCUTANEOUS | 3 refills | Status: DC

## 2022-11-26 NOTE — Progress Notes (Signed)
Patient ID: Brenda [name], female   DOB: 1951/02/06, 72 y.o.   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 Last visit 4 months ago.  Interim history: No increased urination, but no blurry vision, nausea, chest pain.  She has joint pains, including pain in the back.  She c would want to avoid having any more steroid injections due to her diabetes.   She has lifelong insomnia.  In the past, she was waking up at 3 AM to go to work at 5:30 AM. Before last visit, she was started on amiodarone >> developed a high TSH.  She was started on levothyroxine.  Now off amiodarone. She was in the emergency room with A-fib with RVR 10/14/2022. She had a cardioversion. She will need an ablation.  Before the ablation, she needs a CT scan with contrast and she is very worried as she has allergy to contrast in her chart and has to take a very large dose of prednisone and also Benadryl before this.  She mentions that she is not allergic to contrast but the allergy was entered in the system due to her low kidney function.  DM2: Reviewed HbA1c levels: Lab Results  Component Value Date   HGBA1C 6.3 (A) 07/27/2022   HGBA1C 6.9 (A) 03/23/2022   HGBA1C 7.0 (A) 11/20/2021   Patient is on: - Lantus 12 units in a.m.- started 05/2019 by PCP >> 24 >> 18-20 units after dinner - Jardiance 10 before breakfast-added 11/2020 She was on Glipizide in 11/2015 after a steroid inj. We stopped Victoza due to nausea. Metformin ER was stopped by PCP due to CKD. Glipizide ER was stopped 06/2019 due to low blood sugars. She was previously on glipizide XL but stopped 05/2022.  Pt checks her sugars twice a day: - am: 85, 94-140, 160 >> 70-100, 113 >> 78-117, 135, 145 - 2h after b'fast: 156 >> n/c >> 148 >> n/c >> 134 >> n/c -  before lunch: 63, 81-140 >> 71-142, 160 >> 63, 68-114 >> 65-110, 125, 140 - 2h after lunch: 118, 123 >> 92, 99 >> n/c >> 145 >> n/c  - before dinner: 80, 99-157, 160, 170 >> 73-135, 150 >> 68-132, 145 - 2h after dinner:n/c >> 138, 148 >> n/c >> 133 - bedtime:  130-140 >> 137, 142 >> n/c >> 93-115 >> n/c - nighttime: n/c Lowest sugar was 37 x1 >> ... 58 >> 63 x1 >> 60 >> 63 >> 65; she has hypoglycemia awareness in the 70s. Highest sugar was 200 >> 160 >> 164 >> 188 >> 180 (wedding) >> 145  Glucometer: Freestyle Lite  Pt's meals are: - Breakfast: English muffin + preserve or cereal or yoghurt - snack: fruit or fruit + yoghurt - Lunch: 1/2 sandwich + chips + salsa + fruit/sugar free >> protein bar - Dinner: soup or chicken/steak + veggies, occasional starch or Lean cuisine or light salad No soft drinks.  -+ CKD, last BUN/creatinine:  Lab Results  Component Value Date   BUN 43 (H) 10/14/2022   CREATININE 2.17 (H) 10/14/2022  On benazepril.  Sees nephrology. She had a high potassium (5.9) >> used Kayexalate x3 >> normalized.  She is on a low potassium diet.  -+ HL last set of lipids: Lab Results  Component Value Date   CHOL 104 07/31/2022   HDL 21 (L)  07/31/2022   LDLCALC 44 07/31/2022   LDLDIRECT 55.0 05/15/2020   TRIG 193 (H) 07/31/2022   CHOLHDL 5.0 07/31/2022  On Zocor and Zetia.  - last eye exam 03/30/2022: No DR; she had cataract surgery in 2016.  She has a history of retinal tear.  - no numbness and tingling in her feet.  She takes Neurontin for leg cramps at night.  Last foot exam 03/2022.  Acquired hypothyroidism: -Likely related to amiodarone treatment  Reviewed her TFTs: Lab Results  Component Value Date   TSH 8.742 (H) 11/17/2022   TSH 22.801 (H) 09/27/2022   TSH 11.721 (H) 07/30/2022   TSH 2.453 04/19/2022   TSH 2.640 04/14/2021   TSH 1.643 11/19/2017   TSH 2.67 03/07/2017   Lab Results  Component Value Date   FREET4 0.77 11/17/2022   FREET4 0.55 (L)  09/27/2022   T3FREE 3.0 11/17/2022   She was started on levothyroxine 25 mcg daily - started 09/2022.  She takes this: - in am - fasting - at least 30 min from b'fast - no calcium - + iron 1-1.5h later - no multivitamins - + PPIs (Nexium) 1-1.5 hours later - not on Biotin  She also has atrial fibrillation, HTN, GERD, depression. She was admitted 11/2017 for dehydration, anemia, and acute renal failure >> diagnosed with CLL.  ROS: + see HPI  I reviewed pt's medications, allergies, PMH, social hx, family hx, and changes were documented in the history of present illness. Otherwise, unchanged from my initial visit note.  Past Medical History:  Diagnosis Date   Anxiety    Arthritis    Atrial fibrillation (HCC)    CLL (chronic lymphocytic leukemia) ([place])    Dx. 2019 Dr. 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 Particia Nearing first visit 11-2019   Past Surgical History:  Procedure Laterality Date   ABDOMINAL HYSTERECTOMY  1998   COLONOSCOPY     EYE SURGERY Bilateral    cataracts   EYE SURGERY Left    retina tear   FEMUR IM NAIL  10/25/2012   Procedure: INTRAMEDULLARY (IM) NAIL FEMORAL;  Surgeon: Mauri Pole, MD;  Location: [facility];   Service: Orthopedics;  Laterality: Left;   HIP SURGERY     Fracture L IM nail and 3 rods   left knee arthroscopy  12/2010   LYMPH NODE BIOPSY     axillary left 12-2018   REFRACTIVE SURGERY     skin cancer removed right and and lower forearm     squamoun in situ  TOTAL KNEE ARTHROPLASTY  12/20/2011   Procedure: TOTAL KNEE ARTHROPLASTY;  Surgeon: Gearlean Alf, MD;  Location: [facility];  Service: Orthopedics;  Laterality: Left;  Failed attempt at spinal    TOTAL SHOULDER ARTHROPLASTY Right 08/19/2016   Procedure: RIGHT TOTAL SHOULDER ARTHROPLASTY;  Surgeon: Justice Britain, MD;  Location: [place];  Service: Orthopedics;  Laterality: Right;   TOTAL SHOULDER ARTHROPLASTY Left 01/10/2020   Procedure: TOTAL SHOULDER ARTHROPLASTY;  Surgeon: Justice Britain, MD;  Location: [facility];  Service: Orthopedics;  Laterality: Left;  15mn   Social History   Social History   Marital Status: Single    Spouse Name: N/A   Number of Children: 0   Occupational History   RTherapist, sportsat [employer]    Social History Main Topics   Smoking status: Never Smoker    Smokeless tobacco: Never Used   Alcohol Use: No   Drug Use: No   Social History [facility]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      ALPRAZolam (XANAX) 1 MG tablet Take 1 tablet (1 mg total) by mouth 2 (two) times daily as needed for anxiety. 60 tablet 5   amoxicillin (AMOXIL) 500 MG capsule For dental appointment     apixaban (ELIQUIS) 5 MG TABS tablet Take 1 tablet (5 mg total) by mouth 2 (two) times daily. 60 tablet 5   BD PEN NEEDLE NANO 2ND GEN 32G X 4 MM MISC USE DAILY AS DIRECTED 100 each 3   Blood Glucose Monitoring Suppl (ONETOUCH VERIO) w/Device KIT Use as advised 1 kit 0   buPROPion (WELLBUTRIN XL) 300 MG 24 hr tablet Take 1 tablet (300 mg  total) by mouth daily. 90 tablet 3   Cholecalciferol (VITAMIN D3) 2000 units TABS Take 2,000 mcg by mouth daily.     Cyanocobalamin (B-12) 1000 MCG SUBL Place 1,000 mcg under the tongue every evening.      diphenhydrAMINE (BENADRYL) 50 MG tablet Take 1 tablet (50 mg total) by mouth once for 1 dose. Take 1 tablet 1 hour prior to CT scan 1 tablet 0   doxazosin (CARDURA) 2 MG tablet Take 1 tablet (2 mg total) by mouth daily after lunch. 90 tablet 3   esomeprazole (NEXIUM) 20 MG capsule Take 20 mg by mouth every Monday, Wednesday, and Friday.     ezetimibe (ZETIA) 10 MG tablet Take 1 tablet (10 mg total) by mouth daily. 90 tablet 3   glucose blood (ONETOUCH VERIO) test strip TEST BLOOD SUGAR THREE TIMES DAILY AS DIRECTED 300 strip 1   insulin glargine (LANTUS SOLOSTAR) 100 UNIT/ML Solostar Pen Inject 18-20 Units into the skin daily. 30 mL 3   iron polysaccharides (NIFEREX) 150 MG capsule Take 150 mg by mouth at bedtime.      JARDIANCE 10 MG TABS tablet TAKE 1 TABLET(10 MG) BY MOUTH DAILY BEFORE BREAKFAST 30 tablet 11   Lancet Devices (LANCING DEVICE) MISC Use as advised - Freestyle 1 each 0   Lancets (ONETOUCH DELICA PLUS [id number] MISC TEST BLOOD SUGAR THREE TIMES DAILY AS DIRECTED 300 each 1   levothyroxine (SYNTHROID) 25 MCG tablet Take 1 tablet (25 mcg total) by mouth daily before breakfast. 30 tablet 1   lisinopril (ZESTRIL) 20 MG tablet Take 1 tablet (20 mg  total) by mouth 2 (two) times daily. 180 tablet 3   NONFORMULARY OR COMPOUNDED ITEM Apply 1 application topically 2 (two) times daily as needed (sun damage on face). FLUOROURACIL 5% + CALCIPOTRIENE 0.005%     predniSONE (DELTASONE) 50 MG tablet Take 1 tablet 13 hours prior to CT scan, take 1 tablet 7 hours prior to CT scan, take 1 tablet 1 hour prior to CT scan 3 tablet 0   simvastatin (ZOCOR) 40 MG tablet TAKE 1 TABLET(40 MG) BY MOUTH AT BEDTIME 90 tablet 3   triamcinolone cream (KENALOG) 0.1 % Apply 1 application topically 2 (two) times  daily as needed (skin rash.).      No current facility-administered medications on file prior to visit.   Allergies  Allergen Reactions   Carvedilol Other (See Comments)    Sinus Pauses   Gadolinium Derivatives Other (See Comments)   Nsaids Other (See Comments)    Due to kidney function   Amiodarone Other (See Comments)    Causes bradycardia and decreased thyroid function   Amlodipine     Swelling-  leg swell    Contrast Media [Iodinated Contrast Media] Other (See Comments)    Patient states unable to take / use due to kidney function   Family History  Problem Relation Age of Onset   Cancer Mother        breast cancer   Cancer Father        plasma sarcoma   Breast cancer Neg Hx    PE: BP 118/70 (BP Location: Left Arm, Patient Position: Sitting, Cuff Size: Large)   Pulse 94   Ht 5' (1.524 m)   Wt 181 lb 6.4 oz (82.3 kg)   SpO2 95%   BMI 35.43 kg/m   Wt Readings from Last 3 Encounters:  11/26/22 181 lb 6.4 oz (82.3 kg)  11/02/22 182 lb (82.6 kg)  10/29/22 182 lb 3.2 oz (82.6 kg)   Constitutional: overweight, in NAD Eyes: EOMI, no exophthalmos ENT: no thyromegaly, no cervical lymphadenopathy Cardiovascular: tachycardia, No MRG, + LLE edema-chronic, L>R Respiratory: CTA B Musculoskeletal: no deformities Skin: no rashes Neurological: no tremor with outstretched hands  Assessment: 1. DM2, insulin-dependent, uncontrolled, with complications  - mild CKD  2. Obesity class 2  3. HL  4.  Amiodarone-induced hypothyroidism  PLAN:  1. Patient with longstanding, uncontrolled, type 2 diabetes, with much improved control after addition of insulin in summer 2020.  At that time, metformin was stopped due to worsening kidney function.  She was on Victoza but could not tolerate it due to nausea.  She was reticent to retry a GLP-1 receptor agonist afterwards.  At last visit, HbA1c was better, at 6.3% and sugars were mostly lower than 100, down to the 70s.  We decreased her  Lantus at that time. -Reviewing her excellent blood sugar log, sugars appear to be mostly at goal, with very few mild hyperglycemic exceptions.  However, she has few blood sugars in the 60s or low 70s.  Therefore, I advised her to reduce the Lantus dose more - she is worried about the high dose prednisone that she will need to take before her CT scan with contrast.  She is not allergic to the dye.  We discussed that if her sugars increasing the situation, we may need to add short acting insulin for period of time. - I suggested to:  Patient Instructions  Please continue: - Jardiance 10 mg before b'fast   Decrease: - Lantus 14-16 units after dinner  Please continue  Levothyroxine 25 mcg daily.  Take the thyroid hormone every day, with water, at least 30 minutes before breakfast, separated by at least 4 hours from: - acid reflux medications - calcium - iron - multivitamins  Please come back for labs in 4-5 weeks.  Please return in 4 months with your sugar log.  - we checked her HbA1c: 6.1% (excellent) - advised to check sugars at different times of the day - 1x a day, rotating check times - advised for yearly eye exams >> she is UTD - return to clinic in 4 months  2. Obesity class 2 -She has problems eating the right foods, since she also has to limit potassium -Will continue Jardiance 10 mg daily which should also help with weight loss -She was reticent to retry GLP-1 receptor agonist due to previous side effects and cost -She lost 7 pounds before last visit after stopping metoprolol and starting exercise -we previously discussed that she continued to gain weight and unfortunately this was putting more strain on her back.  She cannot exercise due to the pain and was thinking about physical therapy.  I strongly advised her to do so.  She mentioned that she was getting severe back pain with standing for 2 to 3 hours.  I advised her to try to exercise for shorter periods of time and also  to do some chair exercises -She lost 8 pounds since last visit  3.  Hyperlipidemia -Reviewed lipid panel from 07/2022: LDL at goal, triglycerides high, HDL low: Lab Results  Component Value Date   CHOL 104 07/31/2022   HDL 21 (L) 07/31/2022   LDLCALC 44 07/31/2022   LDLDIRECT 55.0 05/15/2020   TRIG 193 (H) 07/31/2022   CHOLHDL 5.0 07/31/2022  -She continues on Zocor 40 mg daily and Zetia 10 mg daily without side effects  4.  Amiodarone-induced hypothyroidism -New diagnosis, likely due to amiodarone-now off the medication - latest thyroid labs reviewed with pt. >> TSH was elevated: Lab Results  Component Value Date   TSH 8.742 (H) 11/17/2022  - she continues on LT4 25 mcg daily - started 10/2022 - we discussed about taking the thyroid hormone every day, with water, >30 minutes before breakfast, separated by >4 hours from acid reflux medications, calcium, iron, multivitamins. Pt. Is not taking it correctly  -she takes iron and PPIs 1 hour - 1.5 hours later.  I advised her to move these at least 4 hours later. - will check thyroid tests in 4-5 weeks -I am hoping that we can stop the levothyroxine in the near future  Philemon Kingdom, MD PhD [facility] Endocrinology

## 2022-11-26 NOTE — Patient Instructions (Addendum)
Please continue: - Jardiance 10 mg before b'fast   Decrease: - Lantus 14-16 units after dinner  Please continue  Levothyroxine 25 mcg daily.  Take the thyroid hormone every day, with water, at least 30 minutes before breakfast, separated by at least 4 hours from: - acid reflux medications - calcium - iron - multivitamins  Please come back for labs in 4-5 weeks.  Please return in 4 months with your sugar log.

## 2022-12-03 ENCOUNTER — Other Ambulatory Visit: Payer: Self-pay | Admitting: Internal Medicine

## 2022-12-06 ENCOUNTER — Encounter: Payer: Self-pay | Admitting: Internal Medicine

## 2022-12-06 ENCOUNTER — Other Ambulatory Visit: Payer: Self-pay | Admitting: Internal Medicine

## 2022-12-06 MED ORDER — LEVOTHYROXINE SODIUM 25 MCG PO TABS: 25.0000 ug | ORAL_TABLET | Freq: Every day | ORAL | 5 refills | Status: DC

## 2022-12-14 ENCOUNTER — Other Ambulatory Visit: Payer: Self-pay | Admitting: Cardiovascular Disease

## 2022-12-17 ENCOUNTER — Inpatient Hospital Stay: Payer: Medicare Other | Attending: Hematology

## 2022-12-17 ENCOUNTER — Inpatient Hospital Stay (HOSPITAL_BASED_OUTPATIENT_CLINIC_OR_DEPARTMENT_OTHER): Payer: Medicare Other | Admitting: Hematology

## 2022-12-17 VITALS — BP 149/70 | HR 98 | Temp 97.9°F | Resp 18 | Wt 185.1 lb

## 2022-12-17 DIAGNOSIS — C9110 Chronic lymphocytic leukemia of B-cell type not having achieved remission: Secondary | ICD-10-CM | POA: Insufficient documentation

## 2022-12-17 DIAGNOSIS — I10 Essential (primary) hypertension: Secondary | ICD-10-CM | POA: Insufficient documentation

## 2022-12-17 DIAGNOSIS — Z8542 Personal history of malignant neoplasm of other parts of uterus: Secondary | ICD-10-CM | POA: Insufficient documentation

## 2022-12-17 DIAGNOSIS — R918 Other nonspecific abnormal finding of lung field: Secondary | ICD-10-CM | POA: Insufficient documentation

## 2022-12-17 DIAGNOSIS — D509 Iron deficiency anemia, unspecified: Secondary | ICD-10-CM | POA: Diagnosis not present

## 2022-12-17 DIAGNOSIS — I4891 Unspecified atrial fibrillation: Secondary | ICD-10-CM | POA: Insufficient documentation

## 2022-12-17 DIAGNOSIS — E119 Type 2 diabetes mellitus without complications: Secondary | ICD-10-CM | POA: Insufficient documentation

## 2022-12-17 DIAGNOSIS — Z90710 Acquired absence of both cervix and uterus: Secondary | ICD-10-CM | POA: Diagnosis not present

## 2022-12-17 DIAGNOSIS — I700 Atherosclerosis of aorta: Secondary | ICD-10-CM | POA: Insufficient documentation

## 2022-12-17 LAB — CMP (CANCER CENTER ONLY)
ALT: 10 U/L (ref 0–44)
AST: 14 U/L — ABNORMAL LOW (ref 15–41)
Albumin: 4.6 g/dL (ref 3.5–5.0)
Alkaline Phosphatase: 64 U/L (ref 38–126)
Anion gap: 6 (ref 5–15)
BUN: 36 mg/dL — ABNORMAL HIGH (ref 8–23)
CO2: 27 mmol/L (ref 22–32)
Calcium: 9.6 mg/dL (ref 8.9–10.3)
Chloride: 106 mmol/L (ref 98–111)
Creatinine: 1.57 mg/dL — ABNORMAL HIGH (ref 0.44–1.00)
GFR, Estimated: 35 mL/min — ABNORMAL LOW (ref >60)
Glucose, Bld: 133 mg/dL — ABNORMAL HIGH (ref 70–99)
Potassium: 4.7 mmol/L (ref 3.5–5.1)
Sodium: 139 mmol/L (ref 135–145)
Total Bilirubin: 0.6 mg/dL (ref 0.3–1.2)
Total Protein: 6.2 g/dL — ABNORMAL LOW (ref 6.5–8.1)

## 2022-12-17 LAB — CBC WITH DIFFERENTIAL (CANCER CENTER ONLY)
Abs Immature Granulocytes: 0.05 10*3/uL (ref 0.00–0.07)
Basophils Absolute: 0.1 10*3/uL (ref 0.0–0.1)
Basophils Relative: 1 %
Eosinophils Absolute: 0.3 10*3/uL (ref 0.0–0.5)
Eosinophils Relative: 1 %
HCT: 40.3 % (ref 36.0–46.0)
Hemoglobin: 12.7 g/dL (ref 12.0–15.0)
Immature Granulocytes: 0 %
Lymphocytes Relative: 76 %
Lymphs Abs: 16.7 10*3/uL — ABNORMAL HIGH (ref 0.7–4.0)
MCH: 30 pg (ref 26.0–34.0)
MCHC: 31.5 g/dL (ref 30.0–36.0)
MCV: 95.3 fL (ref 80.0–100.0)
Monocytes Absolute: 1.4 10*3/uL — ABNORMAL HIGH (ref 0.1–1.0)
Monocytes Relative: 6 %
Neutro Abs: 3.6 10*3/uL (ref 1.7–7.7)
Neutrophils Relative %: 16 %
Platelet Count: 137 10*3/uL — ABNORMAL LOW (ref 150–400)
RBC: 4.23 MIL/uL (ref 3.87–5.11)
RDW: 12.9 % (ref 11.5–15.5)
Smear Review: NORMAL
WBC Count: 22.2 10*3/uL — ABNORMAL HIGH (ref 4.0–10.5)
nRBC: 0 % (ref 0.0–0.2)

## 2022-12-17 LAB — LACTATE DEHYDROGENASE: LDH: 156 U/L (ref 98–192)

## 2022-12-17 NOTE — Progress Notes (Signed)
HEMATOLOGY/ONCOLOGY CLINIC NOTE  Date of Service: 12/17/22    Patient Care Team: [name], MD as PCP - General (Internal Medicine) Rockey Situ Kathlene November, MD as PCP - Cardiology (Cardiology) Mealor, Yetta Barre, MD as PCP - Electrophysiology (Cardiology) Minna Merritts, MD as Consulting Physician (Cardiology)  CHIEF COMPLAINTS/PURPOSE OF CONSULTATION:  Follow-up for continued evaluation and management of CLL  HISTORY OF PRESENTING ILLNESS:  Please see previous note for details on initial presentation  Interval History:  Brenda [name] is here for continued evaluation and management of CLL. Patient was last seen by me on 06/16/22 and was doing well overall.  Today, she reports that she is struggling to have her her Atrial fibrillation controlled. Her HR had been fluctuating between 129-140 and she has presented to ED. Her medication has caused thyroid issues. Her BP is also fluctuating.  She had received a cardioversion on October 18, 2022. Her electrophysiologist also recommended an ablation procedure, which she will receive on January 14, 2023. She will receive a Cardiac CT next month. Her last scan was August 2021.  She reports that Benadryl causes her sleep difficulties. She has been on Eliquis for 2 years. She was discontinued on Amiodarone late September/ early October 2023. She is not taking any weight control medications or beta blockers. She reports a previous adverse reaction to beta blockers in the past.   She denies any infection issues and has not used any steroids. She denies any fever, chills, night sweats, or lumps/bumps. She reports that she only experiences SOB during her A fib episodes. She does complain of some fatigue which she attributes to her A fib medications.  She reports that she sometimes feels a lymph node on her right neck. She has no new or different pain along spine. She denies any abdominal pain, change in bowel habits, or leg  swelling.  She reports a hx of left knee replacement. She complains that she feels an occasional "Warm" sensation in her left knee compared to her right.  MEDICAL HISTORY:  Past Medical History:  Diagnosis Date   Anxiety    Arthritis    Atrial fibrillation (HCC)    CLL (chronic lymphocytic leukemia) ([place])    Dx. 2019 Dr. 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 Particia Nearing first visit 11-2019    SURGICAL HISTORY: Past Surgical History:  Procedure Laterality Date   ABDOMINAL HYSTERECTOMY  1998   COLONOSCOPY     EYE SURGERY Bilateral    cataracts   EYE SURGERY Left  retina tear   FEMUR IM NAIL  10/25/2012   Procedure: INTRAMEDULLARY (IM) NAIL FEMORAL;  Surgeon: Mauri Pole, MD;  Location: [facility];  Service: Orthopedics;  Laterality: Left;   HIP SURGERY     Fracture L IM nail and 3 rods   left knee arthroscopy  12/2010   LYMPH NODE BIOPSY     axillary left 12-2018   REFRACTIVE SURGERY     skin cancer removed right and and lower forearm     squamoun in situ   TOTAL KNEE ARTHROPLASTY  12/20/2011   Procedure: TOTAL KNEE ARTHROPLASTY;  Surgeon: Gearlean Alf, MD;  Location: [facility];  Service: Orthopedics;  Laterality: Left;  Failed attempt  at spinal    TOTAL SHOULDER ARTHROPLASTY Right 08/19/2016   Procedure: RIGHT TOTAL SHOULDER ARTHROPLASTY;  Surgeon: Justice Britain, MD;  Location: [facility];  Service: Orthopedics;  Laterality: Right;   TOTAL SHOULDER ARTHROPLASTY Left 01/10/2020   Procedure: TOTAL SHOULDER ARTHROPLASTY;  Surgeon: Justice Britain, MD;  Location: [facility];  Service: Orthopedics;  Laterality: Left;  149mn    SOCIAL HISTORY: Social History   Socioeconomic History   Marital status: Single    Spouse name: Not on file   Number of children: Not on file   Years of education: Not on file   Highest education level: Not on file  Occupational History   Occupation: RTherapist, 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    Employer: [employer]  Tobacco Use   Smoking status: Never   Smokeless tobacco: Never  Vaping Use   Vaping Use: Never used  Substance and Sexual Activity   Alcohol use: No   Drug use: No   Sexual activity: Not Currently  Other Topics Concern   Not on file  Social History Narrative   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   Social Determinants of Health   Financial Resource Strain: Low Risk  (03/10/2022)   Overall Financial Resource Strain (CARDIA)    Difficulty of Paying Living Expenses: Not hard at all  Food Insecurity: No Food Insecurity (08/24/2022)   Hunger Vital Sign    Worried About Running Out of Food in the Last Year: Never true    RBraidwoodin the Last Year: Never true  Transportation Needs: No Transportation Needs (08/24/2022)   PRAPARE - THydrologist(Medical): No    Lack of Transportation (Non-Medical): No  Physical Activity: Inactive (03/10/2022)   Exercise Vital Sign    Days of Exercise per Week: 0 days    Minutes of Exercise per Session: 0 min  Stress: No Stress Concern Present (03/10/2022)   FM[place]   Feeling of Stress : Not at all  Social Connections: Moderately Isolated (03/10/2022)   Social Connection and  Isolation Panel [NHANES]    Frequency of Communication with Friends and Family: More than three times a week    Frequency of Social Gatherings with Friends and Family: More than three times a week    Attends Religious Services: More than 4 times per year    Active Member of [employer] Organizations: No    Attends CArchivistMeetings: Never    Marital Status: Never married  Intimate Partner Violence: Not At Risk (03/10/2022)   Humiliation, Afraid, Rape, and Kick questionnaire    Fear of Current or Ex-Partner: No    Emotionally Abused: No    Physically Abused: No    Sexually  Abused: No    FAMILY HISTORY: Family History  Problem Relation Age of Onset   Cancer Mother        breast cancer   Cancer Father        plasma sarcoma   Breast cancer Neg Hx     ALLERGIES:  is allergic to carvedilol, gadolinium derivatives, nsaids, amiodarone, amlodipine, and contrast media [iodinated contrast media].  MEDICATIONS:  Current Outpatient Medications  Medication Sig Dispense Refill   acetaminophen (TYLENOL) 500 MG tablet Take 500-1,000 mg by mouth every 6 (six) hours as needed for mild pain, moderate pain or fever.      ALPRAZolam (XANAX) 1 MG tablet Take 1 tablet (1 mg total) by mouth 2 (two) times daily as needed for anxiety. 60 tablet 5   amoxicillin (AMOXIL) 500 MG capsule For dental appointment (Patient not taking: Reported on 11/26/2022)     apixaban (ELIQUIS) 5 MG TABS tablet Take 1 tablet (5 mg total) by mouth 2 (two) times daily. 60 tablet 5   BD PEN NEEDLE NANO 2ND GEN 32G X 4 MM MISC USE DAILY AS DIRECTED 100 each 3   Blood Glucose Monitoring Suppl (ONETOUCH VERIO) w/Device KIT Use as advised 1 kit 0   buPROPion (WELLBUTRIN XL) 300 MG 24 hr tablet TAKE 1 TABLET(300 MG) BY MOUTH DAILY 90 tablet 3   Cholecalciferol (VITAMIN D3) 2000 units TABS Take 2,000 mcg by mouth daily.     Cyanocobalamin (B-12) 1000 MCG SUBL Place 1,000 mcg under the tongue every evening.      diphenhydrAMINE  (BENADRYL) 50 MG tablet Take 1 tablet (50 mg total) by mouth once for 1 dose. Take 1 tablet 1 hour prior to CT scan 1 tablet 0   doxazosin (CARDURA) 2 MG tablet TAKE 1 TABLET(2 MG) BY MOUTH EVERY EVENING 90 tablet 0   esomeprazole (NEXIUM) 20 MG capsule Take 20 mg by mouth every Monday, Wednesday, and Friday.     ezetimibe (ZETIA) 10 MG tablet Take 1 tablet (10 mg total) by mouth daily. 90 tablet 3   glucose blood (ONETOUCH VERIO) test strip TEST BLOOD SUGAR THREE TIMES DAILY AS DIRECTED 300 strip 1   insulin glargine (LANTUS SOLOSTAR) 100 UNIT/ML Solostar Pen Inject 14-16 Units into the skin daily. 30 mL 3   iron polysaccharides (NIFEREX) 150 MG capsule Take 150 mg by mouth at bedtime.      JARDIANCE 10 MG TABS tablet TAKE 1 TABLET(10 MG) BY MOUTH DAILY BEFORE BREAKFAST 30 tablet 11   Lancet Devices (LANCING DEVICE) MISC Use as advised - Freestyle 1 each 0   Lancets (ONETOUCH DELICA PLUS Q000111Q) MISC TEST BLOOD SUGAR THREE TIMES DAILY AS DIRECTED 300 each 1   levothyroxine (SYNTHROID) 25 MCG tablet Take 1 tablet (25 mcg total) by mouth daily before breakfast. 30 tablet 5   lisinopril (ZESTRIL) 20 MG tablet Take 1 tablet (20 mg total) by mouth 2 (two) times daily. 180 tablet 3   NONFORMULARY OR COMPOUNDED ITEM Apply 1 application topically 2 (two) times daily as needed (sun damage on face). FLUOROURACIL 5% + CALCIPOTRIENE 0.005%     predniSONE (DELTASONE) 50 MG tablet Take 1 tablet 13 hours prior to CT scan, take 1 tablet 7 hours prior to CT scan, take 1 tablet 1 hour prior to CT scan 3 tablet 0   simvastatin (ZOCOR) 40 MG tablet TAKE 1 TABLET(40 MG) BY MOUTH AT BEDTIME 90 tablet 3   triamcinolone cream (KENALOG) 0.1 % Apply 1 application topically 2 (two)  times daily as needed (skin rash.).      No current facility-administered medications for this visit.    REVIEW OF SYSTEMS:    10 Point review of Systems was done is negative except as noted above.   PHYSICAL EXAMINATION: ECOG  PERFORMANCE STATUS: 1 - Symptomatic but completely ambulatory  Vitals:   12/17/22 0920  BP: (!) 149/70  Pulse: 98  Resp: 18  Temp: 97.9 F (36.6 C)  SpO2: 95%   Filed Weights   12/17/22 0920  Weight: 185 lb 1.6 oz (84 kg)   .Body mass index is 36.15 kg/m.  GENERAL:alert, in no acute distress and comfortable SKIN: no acute rashes, no significant lesions EYES: conjunctiva are pink and non-injected, sclera anicteric OROPHARYNX: MMM, no exudates, no oropharyngeal erythema or ulceration NECK: supple, no JVD LYMPH:  no palpable lymphadenopathy in the cervical, axillary or inguinal regions LUNGS: clear to auscultation b/l with normal respiratory effort HEART: regular rate & rhythm ABDOMEN:  normoactive bowel sounds , non tender, not distended. Extremity: no pedal edema PSYCH: alert & oriented x 3 with fluent speech NEURO: no focal motor/sensory deficits LABORATORY DATA:  I have reviewed the data as listed  .    Latest Ref Rng & Units 12/17/2022    9:03 AM 10/14/2022    2:37 PM 08/01/2022    1:28 AM  CBC  WBC 4.0 - 10.5 K/uL 22.2  25.2  9.7   Hemoglobin 12.0 - 15.0 g/dL 12.7  13.3  11.3   Hematocrit 36.0 - 46.0 % 40.3  42.3  36.0   Platelets 150 - 400 K/uL 137  145  124    CBC    Component Value Date/Time   WBC 22.2 (H) 12/17/2022 0903   WBC 25.2 (H) 10/14/2022 1437   RBC 4.23 12/17/2022 0903   HGB 12.7 12/17/2022 0903   HCT 40.3 12/17/2022 0903   PLT 137 (L) 12/17/2022 0903   MCV 95.3 12/17/2022 0903   MCH 30.0 12/17/2022 0903   MCHC 31.5 12/17/2022 0903   RDW 12.9 12/17/2022 0903   LYMPHSABS 16.7 (H) 12/17/2022 0903   MONOABS 1.4 (H) 12/17/2022 0903   EOSABS 0.3 12/17/2022 0903   BASOSABS 0.1 12/17/2022 0903       Latest Ref Rng & Units 12/17/2022    9:03 AM 10/14/2022    2:37 PM 07/31/2022    5:00 AM  CMP  Glucose 70 - 99 mg/dL 133  116  102   BUN 8 - 23 mg/dL 36  43  32   Creatinine 0.44 - 1.00 mg/dL 1.57  2.17  1.72   Sodium 135 - 145 mmol/L 139  136   140   Potassium 3.5 - 5.1 mmol/L 4.7  4.6  4.2   Chloride 98 - 111 mmol/L 106  111  112   CO2 22 - 32 mmol/L 27  20  22   $ Calcium 8.9 - 10.3 mg/dL 9.6  9.1  8.9   Total Protein 6.5 - 8.1 g/dL 6.2     Total Bilirubin 0.3 - 1.2 mg/dL 0.6     Alkaline Phos 38 - 126 U/L 64     AST 15 - 41 U/L 14     ALT 0 - 44 U/L 10      Lab Results  Component Value Date   LDH 156 12/17/2022      12/15/17 FISH CLL Prognostic Panel:    01/03/19 Left axillary LN biopsy:    RADIOGRAPHIC STUDIES: I have personally reviewed  the radiological images as listed and agreed with the findings in the report. No results found.  ASSESSMENT & PLAN:   CLARENE ABRAMOWSKI is a 72 y.o. 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 Lab Results  Component Value Date   LDH 159 06/16/2022   05/01/18 CT C/A/P revealed Mildly progressive lymphadenopathy in the chest, abdomen, and pelvis, corresponding to the patient's known CLL, as above. Dominant left external iliac node measures 3.3 cm short axis, previously 2.8cm. Spleen is normal in size. Numerous bilateral pulmonary nodules measuring up to 9 mm, grossly unchanged from 2017. Prior bilateral pleural effusions have resolved.   09/22/18 [place] Bilateral Axilla revealed Ultrasound is performed, showing numerous enlarged LEFT axillary lymph nodes. Largest lymph node in the LEFT axilla has cortical thickening of 9 millimeters. Largest lymph node is 3.3 centimeters in length. Evaluation of the contralateral axilla for comparison demonstrate lymph nodes with cortical thickening. Largest lymph node in the RIGHT axilla is 3.4 x 1.3 centimeters.  01/03/19 Left axillary LN biopsy revealed CLL  2. Multiple pulmonary nodules   CT AP on 11/17/17 - with 1.  Pelvic lymphadenopathy and innumerable pulmonary nodules in the lung bases, consistent with metastatic disease. 2. Cholelithiasis without CT evidence of acute cholecystitis. 3.  Aortic atherosclerosis  CT Chest on 11/20/17 - with multiple small lung nodules in LLL that appear stable from prior CT in 12/2015 and are considered benign and Upper lung nodules also appear benign. Also with several borderline enlarged lymph nodes concerning for a lymphoproliferative disorder   3. H/o endometrial cancer in 1998 -localized to uterus -treated with surgery, abdominal hysterectomy  -no evidence of recurrence  4. Microcytic anemia - Fe deficiency  Presented in hospital with Hgb of 6.6 Hgb holding steady s/p 2U PRBC - Fe infusion completed 1/19 - stool hemoccult negative - suspect this may be due to CKD, but iron deficiency also worrisome for occult GIB  -Tolerated IV iron well  (hgb stable today).  5. Marginal B12 levels in hospital  (Antiparietal celll and anti IF Ab negative) -given one B12 injection in hospital -on replacement  PLAN:   -Discussed lab results on 12/17/22 with patient. CBC stable, showed WBC of 22.2 K, hemoglobin of 12.7, and platelets of 137 K. Platelets stable. -No major shifts in blood counts -CMP shows stable CKD--creatinine 1.57 -No concern for significant CLL progression at this time -No clear indication to initiate CLL treatment at this time. -Patient will continue to follow with PCP and cardiology for management of further medical issues with attention towards mx of Atrial fibrillation and consideration of afib ablation. -repeat CT chest/abd/pelvis scan prior to visit with me in 4-6 months   FOLLOW UP:  RTC with Dr Irene Limbo with labs in 6 months CT chest/abd/pelvis wo contrast in 22 weeks  The total time spent in the appointment was 22 minutes* .  All of the patient's questions were answered with apparent satisfaction. The patient knows to call the clinic with any  problems, questions or concerns.   Sullivan Lone MD MS AAHIVMS [name] Hematology/Oncology Physician [facility]  .*Total Encounter Time as defined by the Centers for Medicare and Medicaid Services includes, in addition to the face-to-face time of a patient visit (documented in the note above) non-face-to-face time: obtaining  and reviewing outside history, ordering and reviewing medications, tests or procedures, care coordination (communications with other health care professionals or caregivers) and documentation in the medical record.   I,Mitra Faeizi,acting as a Education administrator for Sullivan Lone, MD.,have documented all relevant documentation on the behalf of Sullivan Lone, MD,as directed by  Sullivan Lone, MD while in the presence of Sullivan Lone, MD.  .I have reviewed the above documentation for accuracy and completeness, and I agree with the above. Brunetta Genera MD

## 2022-12-28 ENCOUNTER — Other Ambulatory Visit (INDEPENDENT_AMBULATORY_CARE_PROVIDER_SITE_OTHER): Payer: Medicare Other

## 2022-12-28 DIAGNOSIS — E032 Hypothyroidism due to medicaments and other exogenous substances: Secondary | ICD-10-CM | POA: Diagnosis not present

## 2022-12-28 LAB — TSH: TSH: 4.99 u[IU]/mL (ref 0.35–5.50)

## 2022-12-28 LAB — T4, FREE: Free T4: 0.77 ng/dL (ref 0.60–1.60)

## 2022-12-30 ENCOUNTER — Ambulatory Visit: Payer: Medicare Other | Attending: Hematology

## 2022-12-30 DIAGNOSIS — I4891 Unspecified atrial fibrillation: Secondary | ICD-10-CM | POA: Diagnosis not present

## 2022-12-31 LAB — BASIC METABOLIC PANEL
BUN/Creatinine Ratio: 21 (ref 12–28)
BUN: 34 mg/dL — ABNORMAL HIGH (ref 8–27)
CO2: 20 mmol/L (ref 20–29)
Calcium: 9.5 mg/dL (ref 8.7–10.3)
Chloride: 108 mmol/L — ABNORMAL HIGH (ref 96–106)
Creatinine, Ser: 1.63 mg/dL — ABNORMAL HIGH (ref 0.57–1.00)
Glucose: 115 mg/dL — ABNORMAL HIGH (ref 70–99)
Potassium: 4.8 mmol/L (ref 3.5–5.2)
Sodium: 144 mmol/L (ref 134–144)
eGFR: 33 mL/min/{1.73_m2} — ABNORMAL LOW (ref >59)

## 2022-12-31 LAB — CBC WITH DIFFERENTIAL/PLATELET
Basophils Absolute: 0.1 10*3/uL (ref 0.0–0.2)
Basos: 0 %
EOS (ABSOLUTE): 0.2 10*3/uL (ref 0.0–0.4)
Eos: 1 %
Hematocrit: 37.9 % (ref 34.0–46.6)
Hemoglobin: 12 g/dL (ref 11.1–15.9)
Immature Grans (Abs): 0 10*3/uL (ref 0.0–0.1)
Immature Granulocytes: 0 %
Lymphocytes Absolute: 19.6 10*3/uL — ABNORMAL HIGH (ref 0.7–3.1)
Lymphs: 80 %
MCH: 29.6 pg (ref 26.6–33.0)
MCHC: 31.7 g/dL (ref 31.5–35.7)
MCV: 94 fL (ref 79–97)
Monocytes Absolute: 0.8 10*3/uL (ref 0.1–0.9)
Monocytes: 3 %
Neutrophils Absolute: 4.1 10*3/uL (ref 1.4–7.0)
Neutrophils: 16 %
Platelets: 142 10*3/uL — ABNORMAL LOW (ref 150–450)
RBC: 4.05 x10E6/uL (ref 3.77–5.28)
RDW: 12.9 % (ref 11.7–15.4)
WBC: 24.8 10*3/uL (ref 3.4–10.8)

## 2023-01-10 ENCOUNTER — Ambulatory Visit (HOSPITAL_BASED_OUTPATIENT_CLINIC_OR_DEPARTMENT_OTHER)
Admission: RE | Admit: 2023-01-10 | Discharge: 2023-01-10 | Disposition: A | Discharge status: Home / Self Care | Payer: Medicare Other | Source: Ambulatory Visit | Attending: Cardiovascular Disease | Admitting: Cardiovascular Disease

## 2023-01-10 ENCOUNTER — Ambulatory Visit (HOSPITAL_COMMUNITY)
Admission: RE | Admit: 2023-01-10 | Discharge: 2023-01-10 | Disposition: A | Discharge status: Home / Self Care | Payer: Medicare Other | Source: Ambulatory Visit | Attending: Cardiovascular Disease | Admitting: Cardiovascular Disease

## 2023-01-10 ENCOUNTER — Ambulatory Visit (HOSPITAL_COMMUNITY): Admission: RE | Admit: 2023-01-10 | Payer: Medicare Other | Source: Ambulatory Visit

## 2023-01-10 VITALS — BP 136/58 | HR 73 | Temp 98.1°F | Resp 18 | Wt 185.2 lb

## 2023-01-10 DIAGNOSIS — I4891 Unspecified atrial fibrillation: Secondary | ICD-10-CM | POA: Insufficient documentation

## 2023-01-10 DIAGNOSIS — R600 Localized edema: Secondary | ICD-10-CM | POA: Insufficient documentation

## 2023-01-10 DIAGNOSIS — I2510 Atherosclerotic heart disease of native coronary artery without angina pectoris: Secondary | ICD-10-CM | POA: Insufficient documentation

## 2023-01-10 DIAGNOSIS — N1832 Chronic kidney disease, stage 3b: Secondary | ICD-10-CM | POA: Diagnosis not present

## 2023-01-10 LAB — BASIC METABOLIC PANEL
Anion gap: 9 (ref 5–15)
BUN: 29 mg/dL — ABNORMAL HIGH (ref 8–23)
CO2: 21 mmol/L — ABNORMAL LOW (ref 22–32)
Calcium: 9.2 mg/dL (ref 8.9–10.3)
Chloride: 107 mmol/L (ref 98–111)
Creatinine, Ser: 1.58 mg/dL — ABNORMAL HIGH (ref 0.44–1.00)
GFR, Estimated: 34 mL/min — ABNORMAL LOW (ref >60)
Glucose, Bld: 137 mg/dL — ABNORMAL HIGH (ref 70–99)
Potassium: 4.6 mmol/L (ref 3.5–5.1)
Sodium: 137 mmol/L (ref 135–145)

## 2023-01-10 LAB — GLUCOSE, CAPILLARY
Glucose-Capillary: 58 mg/dL — ABNORMAL LOW (ref 70–99)
Glucose-Capillary: 68 mg/dL — ABNORMAL LOW (ref 70–99)
Glucose-Capillary: 78 mg/dL (ref 70–99)
Glucose-Capillary: 104 mg/dL — ABNORMAL HIGH (ref 70–99)

## 2023-01-10 MED ORDER — SODIUM CHLORIDE 0.9 % WEIGHT BASED INFUSION: 1.0000 mL/kg/h | INTRAVENOUS | Status: DC

## 2023-01-10 MED ORDER — SODIUM CHLORIDE 0.9 % WEIGHT BASED INFUSION
3.0000 mL/kg/h | INTRAVENOUS | Status: AC
  Administered 2023-01-10: 3 mL/kg/h via INTRAVENOUS

## 2023-01-10 MED ORDER — IOHEXOL 350 MG/ML SOLN
80.0000 mL | Freq: Once | INTRAVENOUS | Status: AC | PRN
  Administered 2023-01-10: 80 mL via INTRAVENOUS

## 2023-01-10 NOTE — Progress Notes (Addendum)
Hypoglycemic Event  CBG: 58  Treatment: 4oz soda  Symptoms: Pale  Follow-up CBG: [id number] CBG Result:68  Possible Reasons for Event: Other: NPO prior to CT  Comments/MD notified:NPO today for cardiac CT.  Eating peanut butter and teddy grahams and will recheck blood sugar in 23mn.    RChales Salmon

## 2023-01-10 NOTE — Progress Notes (Signed)
Hypoglycemic Event  CBG: 68  Treatment: 4 oz juice/soda  Symptoms: Pale  Follow-up CBG: Time:1520 CBG Result:78  Possible Reasons for Event: Other: NPO prior to cardiac CT   Comments/MD notified:gave pt another 4 oz of juice     Brenda [name]

## 2023-01-10 NOTE — Progress Notes (Addendum)
Hypoglycemic Event  CBG: 78  Treatment: 4 oz juice/soda  Symptoms: pale  Follow-up CBG: Time:1545 CBG Result:104  Possible Reasons for Event: Other: NPO prior to cardiac CT  Comments/MD notified:Notified Dr Doralee Albino Via secure chat that pt was feeling well and being DC home now.    Brenda [name]

## 2023-01-13 NOTE — Pre-Procedure Instructions (Signed)
Instructed patient on the following items: Arrival time 0530 Nothing to eat or drink after midnight No meds AM of procedure Responsible person to drive you home and stay with you for 24 hrs  Have you missed any doses of anti-coagulant Eliquis- hasn't missed any doses, don't take in the morning

## 2023-01-14 ENCOUNTER — Encounter (HOSPITAL_COMMUNITY)
Admission: RE | Disposition: A | Discharge status: Home / Self Care | Payer: Self-pay | Source: Home / Self Care | Attending: Cardiovascular Disease

## 2023-01-14 ENCOUNTER — Ambulatory Visit (HOSPITAL_BASED_OUTPATIENT_CLINIC_OR_DEPARTMENT_OTHER): Payer: Medicare Other | Admitting: Anesthesiology

## 2023-01-14 ENCOUNTER — Ambulatory Visit (HOSPITAL_COMMUNITY)
Admission: RE | Admit: 2023-01-14 | Discharge: 2023-01-14 | Disposition: A | Discharge status: Home / Self Care | Payer: Medicare Other | Attending: Cardiovascular Disease | Admitting: Cardiovascular Disease

## 2023-01-14 ENCOUNTER — Ambulatory Visit (HOSPITAL_COMMUNITY): Payer: Medicare Other | Admitting: Anesthesiology

## 2023-01-14 ENCOUNTER — Other Ambulatory Visit (HOSPITAL_COMMUNITY): Payer: Self-pay

## 2023-01-14 DIAGNOSIS — E1122 Type 2 diabetes mellitus with diabetic chronic kidney disease: Secondary | ICD-10-CM | POA: Diagnosis not present

## 2023-01-14 DIAGNOSIS — I4819 Other persistent atrial fibrillation: Secondary | ICD-10-CM | POA: Diagnosis not present

## 2023-01-14 DIAGNOSIS — I4891 Unspecified atrial fibrillation: Secondary | ICD-10-CM

## 2023-01-14 DIAGNOSIS — I129 Hypertensive chronic kidney disease with stage 1 through stage 4 chronic kidney disease, or unspecified chronic kidney disease: Secondary | ICD-10-CM | POA: Insufficient documentation

## 2023-01-14 DIAGNOSIS — Z7901 Long term (current) use of anticoagulants: Secondary | ICD-10-CM | POA: Insufficient documentation

## 2023-01-14 DIAGNOSIS — Z794 Long term (current) use of insulin: Secondary | ICD-10-CM | POA: Insufficient documentation

## 2023-01-14 DIAGNOSIS — Z79899 Other long term (current) drug therapy: Secondary | ICD-10-CM | POA: Diagnosis not present

## 2023-01-14 DIAGNOSIS — Z6835 Body mass index (BMI) 35.0-35.9, adult: Secondary | ICD-10-CM | POA: Insufficient documentation

## 2023-01-14 DIAGNOSIS — I2510 Atherosclerotic heart disease of native coronary artery without angina pectoris: Secondary | ICD-10-CM | POA: Insufficient documentation

## 2023-01-14 DIAGNOSIS — E669 Obesity, unspecified: Secondary | ICD-10-CM | POA: Diagnosis not present

## 2023-01-14 DIAGNOSIS — N184 Chronic kidney disease, stage 4 (severe): Secondary | ICD-10-CM | POA: Diagnosis not present

## 2023-01-14 HISTORY — PX: ATRIAL FIBRILLATION ABLATION: EP1191

## 2023-01-14 LAB — GLUCOSE, CAPILLARY
Glucose-Capillary: 91 mg/dL (ref 70–99)
Glucose-Capillary: 96 mg/dL (ref 70–99)
Glucose-Capillary: 104 mg/dL — ABNORMAL HIGH (ref 70–99)
Glucose-Capillary: 126 mg/dL — ABNORMAL HIGH (ref 70–99)
Glucose-Capillary: 186 mg/dL — ABNORMAL HIGH (ref 70–99)

## 2023-01-14 LAB — CBC
HCT: 37.6 % (ref 36.0–46.0)
Hemoglobin: 11.4 g/dL — ABNORMAL LOW (ref 12.0–15.0)
MCH: 29.3 pg (ref 26.0–34.0)
MCHC: 30.3 g/dL (ref 30.0–36.0)
MCV: 96.7 fL (ref 80.0–100.0)
Platelets: 119 10*3/uL — ABNORMAL LOW (ref 150–400)
RBC: 3.89 MIL/uL (ref 3.87–5.11)
RDW: 13 % (ref 11.5–15.5)
WBC: 22.1 10*3/uL — ABNORMAL HIGH (ref 4.0–10.5)
nRBC: 0 % (ref 0.0–0.2)

## 2023-01-14 LAB — POCT ACTIVATED CLOTTING TIME
Activated Clotting Time: 266 seconds
Activated Clotting Time: 293 seconds
Activated Clotting Time: 304 seconds

## 2023-01-14 SURGERY — ATRIAL FIBRILLATION ABLATION
Anesthesia: General

## 2023-01-14 MED ORDER — COLCHICINE 0.6 MG PO TABS
0.6000 mg | ORAL_TABLET | Freq: Two times a day (BID) | ORAL | 0 refills | Status: DC
  Filled 2023-01-14: qty 10, 5d supply, fill #0

## 2023-01-14 MED ORDER — PANTOPRAZOLE SODIUM 40 MG PO TBEC
40.0000 mg | DELAYED_RELEASE_TABLET | Freq: Every day | ORAL | 0 refills | Status: DC
  Filled 2023-01-14: qty 45, 45d supply, fill #0

## 2023-01-14 MED ORDER — DEXAMETHASONE SODIUM PHOSPHATE 10 MG/ML IJ SOLN
INTRAMUSCULAR | Status: DC | PRN
  Administered 2023-01-14: 5 mg via INTRAVENOUS

## 2023-01-14 MED ORDER — SODIUM CHLORIDE 0.9 % IV SOLN: INTRAVENOUS | Status: DC

## 2023-01-14 MED ORDER — PROTAMINE SULFATE 10 MG/ML IV SOLN
INTRAVENOUS | Status: DC | PRN
  Administered 2023-01-14: 10 mg via INTRAVENOUS
  Administered 2023-01-14: 40 mg via INTRAVENOUS

## 2023-01-14 MED ORDER — ROCURONIUM BROMIDE 10 MG/ML (PF) SYRINGE
PREFILLED_SYRINGE | INTRAVENOUS | Status: DC | PRN
  Administered 2023-01-14: 50 mg via INTRAVENOUS
  Administered 2023-01-14: 20 mg via INTRAVENOUS

## 2023-01-14 MED ORDER — MIDAZOLAM HCL 5 MG/5ML IJ SOLN
INTRAMUSCULAR | Status: DC | PRN
  Administered 2023-01-14 (×2): 1 mg via INTRAVENOUS

## 2023-01-14 MED ORDER — PHENYLEPHRINE 80 MCG/ML (10ML) SYRINGE FOR IV PUSH (FOR BLOOD PRESSURE SUPPORT)
PREFILLED_SYRINGE | INTRAVENOUS | Status: DC | PRN
  Administered 2023-01-14: 160 ug via INTRAVENOUS
  Administered 2023-01-14 (×4): 80 ug via INTRAVENOUS
  Administered 2023-01-14 (×2): 160 ug via INTRAVENOUS
  Administered 2023-01-14 (×2): 80 ug via INTRAVENOUS
  Administered 2023-01-14: 160 ug via INTRAVENOUS

## 2023-01-14 MED ORDER — EPHEDRINE SULFATE-NACL 50-0.9 MG/10ML-% IV SOSY
PREFILLED_SYRINGE | INTRAVENOUS | Status: DC | PRN
  Administered 2023-01-14: 2.5 mg via INTRAVENOUS
  Administered 2023-01-14: 5 mg via INTRAVENOUS
  Administered 2023-01-14: 2.5 mg via INTRAVENOUS

## 2023-01-14 MED ORDER — DOBUTAMINE INFUSION FOR EP/ECHO/NUC (1000 MCG/ML)
INTRAVENOUS | Status: AC
  Filled 2023-01-14: qty 250

## 2023-01-14 MED ORDER — ONDANSETRON HCL 4 MG/2ML IJ SOLN: 4.0000 mg | Freq: Four times a day (QID) | INTRAMUSCULAR | Status: DC | PRN

## 2023-01-14 MED ORDER — PROPOFOL 10 MG/ML IV BOLUS
INTRAVENOUS | Status: DC | PRN
  Administered 2023-01-14: 50 mg via INTRAVENOUS
  Administered 2023-01-14: 100 mg via INTRAVENOUS

## 2023-01-14 MED ORDER — ACETAMINOPHEN 500 MG PO TABS
1000.0000 mg | ORAL_TABLET | Freq: Once | ORAL | Status: DC
  Filled 2023-01-14: qty 2

## 2023-01-14 MED ORDER — SODIUM CHLORIDE 0.9% FLUSH: 3.0000 mL | Freq: Two times a day (BID) | INTRAVENOUS | Status: DC

## 2023-01-14 MED ORDER — ACETAMINOPHEN 325 MG PO TABS: 650.0000 mg | ORAL_TABLET | ORAL | Status: DC | PRN

## 2023-01-14 MED ORDER — LIDOCAINE 2% (20 MG/ML) 5 ML SYRINGE
INTRAMUSCULAR | Status: DC | PRN
  Administered 2023-01-14: 40 mg via INTRAVENOUS

## 2023-01-14 MED ORDER — DOBUTAMINE INFUSION FOR EP/ECHO/NUC (1000 MCG/ML)
INTRAVENOUS | Status: DC | PRN
  Administered 2023-01-14: 20 ug/kg/min via INTRAVENOUS

## 2023-01-14 MED ORDER — HEPARIN (PORCINE) IN NACL 1000-0.9 UT/500ML-% IV SOLN
INTRAVENOUS | Status: DC | PRN
  Administered 2023-01-14 (×4): 500 mL

## 2023-01-14 MED ORDER — HEPARIN SODIUM (PORCINE) 1000 UNIT/ML IJ SOLN
INTRAMUSCULAR | Status: DC | PRN
  Administered 2023-01-14: 1000 [IU] via INTRAVENOUS

## 2023-01-14 MED ORDER — HEPARIN SODIUM (PORCINE) 1000 UNIT/ML IJ SOLN
INTRAMUSCULAR | Status: DC | PRN
  Administered 2023-01-14: 3000 [IU] via INTRAVENOUS
  Administered 2023-01-14: 2000 [IU] via INTRAVENOUS
  Administered 2023-01-14: 15000 [IU] via INTRAVENOUS

## 2023-01-14 MED ORDER — SODIUM CHLORIDE 0.9 % IV SOLN: 250.0000 mL | INTRAVENOUS | Status: DC | PRN

## 2023-01-14 MED ORDER — FENTANYL CITRATE (PF) 100 MCG/2ML IJ SOLN
INTRAMUSCULAR | Status: DC | PRN
  Administered 2023-01-14: 50 ug via INTRAVENOUS

## 2023-01-14 MED ORDER — PHENYLEPHRINE HCL-NACL 20-0.9 MG/250ML-% IV SOLN
INTRAVENOUS | Status: DC | PRN
  Administered 2023-01-14: 50 ug/min via INTRAVENOUS
  Administered 2023-01-14: 60 ug/min via INTRAVENOUS

## 2023-01-14 MED ORDER — SODIUM CHLORIDE 0.9% FLUSH: 3.0000 mL | INTRAVENOUS | Status: DC | PRN

## 2023-01-14 MED ORDER — SUGAMMADEX SODIUM 200 MG/2ML IV SOLN
INTRAVENOUS | Status: DC | PRN
  Administered 2023-01-14 (×2): 100 mg via INTRAVENOUS

## 2023-01-14 MED ORDER — ONDANSETRON HCL 4 MG/2ML IJ SOLN
INTRAMUSCULAR | Status: DC | PRN
  Administered 2023-01-14: 4 mg via INTRAVENOUS

## 2023-01-14 SURGICAL SUPPLY — 19 items
BLANKET WARM UNDERBOD FULL ACC (MISCELLANEOUS) ×1 IMPLANT
CATH 8FR REPROCESSED SOUNDSTAR (CATHETERS) ×1 IMPLANT
CATH 8FR SOUNDSTAR REPROCESSED (CATHETERS) IMPLANT
CATH ABLAT QDOT MICRO BI TC DF (CATHETERS) IMPLANT
CATH OCTARAY 2.0 F 3-3-3-3-3 (CATHETERS) IMPLANT
CATH PIGTAIL STEERABLE D1 8.7 (WIRE) IMPLANT
CATH S-M CIRCA TEMP PROBE (CATHETERS) IMPLANT
CATH WEBSTER BI DIR CS D-F CRV (CATHETERS) IMPLANT
CLOSURE PERCLOSE PROSTYLE (VASCULAR PRODUCTS) IMPLANT
COVER SWIFTLINK CONNECTOR (BAG) ×1 IMPLANT
DEVICE CLOSURE MYNXGRIP 6/7F (Vascular Products) IMPLANT
PACK EP LATEX FREE (CUSTOM PROCEDURE TRAY) ×1
PACK EP LF (CUSTOM PROCEDURE TRAY) ×1 IMPLANT
PAD DEFIB RADIO PHYSIO CONN (PAD) ×1 IMPLANT
PATCH CARTO3 (PAD) IMPLANT
SHEATH CARTO VIZIGO SM CVD (SHEATH) IMPLANT
SHEATH PINNACLE 8F 10CM (SHEATH) IMPLANT
SHEATH PINNACLE 9F 10CM (SHEATH) IMPLANT
TUBING SMART ABLATE COOLFLOW (TUBING) IMPLANT

## 2023-01-14 NOTE — Anesthesia Preprocedure Evaluation (Addendum)
Anesthesia Evaluation  Patient identified by MRN, date of birth, ID band Patient awake    Reviewed: Allergy & Precautions, NPO status , Patient's Chart, lab work & pertinent test results  Airway Mallampati: III  TM Distance: >3 FB Neck ROM: Full    Dental  (+) Dental Advisory Given   Pulmonary neg pulmonary ROS   breath sounds clear to auscultation       Cardiovascular hypertension, Pt. on medications + CAD  + dysrhythmias Atrial Fibrillation  Rhythm:Regular Rate:Normal     Neuro/Psych  Neuromuscular disease    GI/Hepatic Neg liver ROS, hiatal hernia,GERD  ,,  Endo/Other  diabetes, Type 2    Renal/GU CRFRenal disease     Musculoskeletal  (+) Arthritis ,    Abdominal   Peds  Hematology negative hematology ROS (+)   Anesthesia Other Findings   Reproductive/Obstetrics                             Anesthesia Physical Anesthesia Plan  ASA: 2  Anesthesia Plan: General   Post-op Pain Management: Tylenol PO (pre-op)* and Minimal or no pain anticipated   Induction: Intravenous  PONV Risk Score and Plan: 3 and Dexamethasone, Ondansetron and Treatment may vary due to age or medical condition  Airway Management Planned: Oral ETT and Video Laryngoscope Planned  Additional Equipment: None  Intra-op Plan:   Post-operative Plan: Extubation in OR  Informed Consent: I have reviewed the patients History and Physical, chart, labs and discussed the procedure including the risks, benefits and alternatives for the proposed anesthesia with the patient or authorized representative who has indicated his/her understanding and acceptance.     Dental advisory given  Plan Discussed with: CRNA  Anesthesia Plan Comments:        Anesthesia Quick Evaluation

## 2023-01-14 NOTE — Anesthesia Postprocedure Evaluation (Signed)
Anesthesia Post Note  Patient: Brenda [name]  Procedure(s) Performed: ATRIAL FIBRILLATION ABLATION     Patient location during evaluation: PACU Anesthesia Type: General Level of consciousness: awake and alert Pain management: pain level controlled Vital Signs Assessment: post-procedure vital signs reviewed and stable Respiratory status: spontaneous breathing, nonlabored ventilation, respiratory function stable and patient connected to nasal cannula oxygen Cardiovascular status: blood pressure returned to baseline and stable Postop Assessment: no apparent nausea or vomiting Anesthetic complications: no  No notable events documented.  Last Vitals:  Vitals:   01/14/23 1130 01/14/23 1145  BP: (!) 120/51 (!) 116/51  Pulse: 74 75  Resp: 17 15  Temp:    SpO2: 92% 90%    Last Pain:  Vitals:   01/14/23 1120  TempSrc:   PainSc: 0-No pain                 Tiajuana Amass

## 2023-01-14 NOTE — Progress Notes (Signed)
Patient ambulated to BR , both groins sites stable. Both sites soft.

## 2023-01-14 NOTE — Transfer of Care (Signed)
Immediate Anesthesia Transfer of Care Note  Patient: Brenda [name]  Procedure(s) Performed: ATRIAL FIBRILLATION ABLATION  Patient Location: Cath Lab  Anesthesia Type:General  Level of Consciousness: drowsy and patient cooperative  Airway & Oxygen Therapy: Patient Spontanous Breathing and Patient connected to nasal cannula oxygen  Post-op Assessment: Report given to RN and Post -op Vital signs reviewed and stable  Post vital signs: Reviewed and stable  Last Vitals:  Vitals Value Taken Time  BP 125/64 (64)   Temp    Pulse 77   Resp 14   SpO2 94%     Last Pain:  Vitals:   01/14/23 0612  TempSrc:   PainSc: 0-No pain         Complications: No notable events documented.

## 2023-01-14 NOTE — H&P (Signed)
Cardiology Office Note:    Date:  01/14/2023   ID:  Brenda [name], DOB [birth year], MRN [id number]  PCP:  [name], MD   [place] [facility] Cardiologist:  Ida Rogue, MD Electrophysiologist:  Melida Quitter, MD     Referring MD: No ref. provider found    History of Present Illness:    Brenda [name] is a 72 y.o. female with a hx listed below pertinent for CAD, DM, HTN, persistent AF, CKD IIIb-IV referred for arrhythmia management.  She was diagnosed with atrial fibrillation in May 2022 and placed on metoprolol and eliquis.  She was started on amiodarone in June 2023.  In September 2023, she complained of lightheadedness and dizziness.  A Zio patch was placed that showed sinus pauses up to about 8 seconds, so her Coreg and amiodarone were discontinued.  Additionally, her TSH was elevated at 11.7.  Pacemaker placement was deferred with a reversible cause.  Monitor placed and follow-up did not show any atrial fibrillation or significant bradycardia.  She has had sinus bradycardia with metoprolol years ago.  She reports that she has been doing very well since wearing the monitor.  She records her heart rate 4 times a day, and these have been running between 70 and 90 bpm.  She notes that when she did have atrial fibrillation, her primary symptom was fatigue.  She did not have significant palpitations.  She could appreciate a regular rhythm during pulse checks.  She is a retired [employer] scientist.  I reviewed the patient's CT and labs. There was no LAA thrombus. she  has not missed any doses of anticoagulation, and she took her dose last night. There have been no changes in the patient's diagnoses, medications, or condition since our recent clinic visit.    Past Medical History:  Diagnosis Date   Anxiety    Arthritis    Atrial fibrillation (HCC)    CLL (chronic lymphocytic leukemia) ([place])    Dx. 2019 Dr. 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 Particia Nearing first visit 11-2019    Past Surgical History:  Procedure Laterality Date   ABDOMINAL HYSTERECTOMY  1998   COLONOSCOPY     EYE SURGERY Bilateral    cataracts   EYE SURGERY Left    retina tear   FEMUR IM NAIL  10/25/2012   Procedure: INTRAMEDULLARY (IM) NAIL FEMORAL;  Surgeon: Mauri Pole, MD;  Location: [facility];  Service: Orthopedics;  Laterality: Left;   HIP SURGERY     Fracture L  IM nail and 3 rods   left knee arthroscopy  12/2010   LYMPH NODE BIOPSY     axillary left 12-2018   REFRACTIVE SURGERY     skin cancer removed right and and lower forearm     squamoun in situ   TOTAL KNEE ARTHROPLASTY  12/20/2011   Procedure: TOTAL KNEE ARTHROPLASTY;  Surgeon: Gearlean Alf, MD;  Location: [facility];  Service: Orthopedics;  Laterality: Left;  Failed attempt at spinal    TOTAL SHOULDER ARTHROPLASTY Right 08/19/2016   Procedure: RIGHT TOTAL SHOULDER ARTHROPLASTY;  Surgeon: Justice Britain, MD;  Location: [place];  Service: Orthopedics;  Laterality: Right;   TOTAL SHOULDER ARTHROPLASTY Left 01/10/2020   Procedure: TOTAL SHOULDER ARTHROPLASTY;  Surgeon: Justice Britain, MD;  Location: [facility];  Service: Orthopedics;  Laterality: Left;  157min    Current  Medications: Current Meds  Medication Sig   acetaminophen (TYLENOL) 500 MG tablet Take 500-1,000 mg by mouth every 6 (six) hours as needed for mild pain, moderate pain or fever.    ALPRAZolam (XANAX) 1 MG tablet Take 1 tablet (1 mg total) by mouth 2 (two) times daily as needed for anxiety. (Patient taking differently: Take 1 mg by mouth 2 (two) times daily.)   apixaban (ELIQUIS) 5 MG TABS tablet Take 1 tablet (5 mg total) by mouth 2 (two) times daily.   BD PEN NEEDLE NANO 2ND GEN 32G X 4 MM MISC USE DAILY AS DIRECTED   Blood Glucose Monitoring Suppl (ONETOUCH VERIO) w/Device KIT Use as advised   buPROPion (WELLBUTRIN XL) 300 MG 24 hr tablet TAKE 1 TABLET(300 MG) BY MOUTH DAILY   Cholecalciferol (VITAMIN D3) 2000 units TABS Take 2,000 mcg by mouth daily.   Cyanocobalamin (B-12) 1000 MCG SUBL Place 1,000 mcg under the tongue every evening.    doxazosin (CARDURA) 2 MG tablet TAKE 1 TABLET(2 MG) BY MOUTH EVERY EVENING   esomeprazole (NEXIUM) 20 MG capsule Take 20 mg by mouth every Monday, Wednesday, and Friday.   ezetimibe (ZETIA) 10 MG tablet Take 1 tablet (10 mg total) by mouth daily.   glucose blood (ONETOUCH VERIO) test strip TEST BLOOD SUGAR THREE TIMES DAILY AS DIRECTED   insulin glargine (LANTUS SOLOSTAR) 100 UNIT/ML Solostar Pen Inject 14-16 Units into the skin daily. (Patient taking differently: Inject 14-16 Units into the skin at bedtime.)   iron polysaccharides (NIFEREX) 150 MG capsule Take 150 mg by mouth See admin instructions. Tue Thu and Sat   JARDIANCE 10 MG TABS tablet TAKE 1 TABLET(10 MG) BY MOUTH DAILY BEFORE BREAKFAST   Lancet Devices (LANCING DEVICE) MISC Use as advised - Freestyle   Lancets (ONETOUCH DELICA PLUS Q000111Q) MISC TEST BLOOD SUGAR THREE TIMES DAILY AS DIRECTED   levothyroxine (SYNTHROID) 25 MCG tablet Take 1 tablet (25 mcg total) by mouth daily before breakfast.   lisinopril (ZESTRIL) 20 MG tablet Take 1 tablet (20 mg total) by mouth 2 (two) times daily.    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  Employer: [employer]  Tobacco Use   Smoking status: Never   Smokeless tobacco: Never  Vaping Use   Vaping Use: Never used  Substance and Sexual Activity   Alcohol use: No   Drug use: No   Sexual activity: Not Currently  Other Topics Concern   Not on file  Social History Narrative   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   Social Determinants of Health   Financial Resource Strain: Low Risk  (03/10/2022)   Overall Financial Resource Strain (CARDIA)    Difficulty of Paying Living Expenses: Not hard at all  Food Insecurity: No Food Insecurity (08/24/2022)   Hunger Vital Sign    Worried About Running Out of Food in the Last Year: Never true    Ran Out of Food in the Last Year: Never true  Transportation Needs: No Transportation Needs (08/24/2022)   PRAPARE - Hydrologist (Medical): No    Lack of Transportation (Non-Medical): No  Physical Activity: Inactive (03/10/2022)   Exercise Vital Sign    Days of Exercise per Week: 0 days    Minutes of Exercise per Session: 0 min  Stress: No Stress Concern Present (03/10/2022)   [place]    Feeling of Stress : Not at all  Social Connections: Moderately Isolated (03/10/2022)   Social Connection and Isolation Panel [NHANES]    Frequency of Communication with Friends and Family: More than three times a week    Frequency of Social Gatherings with Friends and  Family: More than three times a week    Attends Religious Services: More than 4 times per year    Active Member of [employer] or Organizations: No    Attends Archivist Meetings: Never    Marital Status: Never married     Family History: The patient's family history includes Cancer in her father and mother. There is no history of Breast cancer.  ROS:   Please see the history of present illness.    All other systems reviewed and are negative.  EKGs/Labs/Other Studies Reviewed:     EKG:  Last EKG results: Sinus rhythm 68 bpm   Recent Labs: 10/14/2022: Magnesium 2.0 12/17/2022: ALT 10 12/28/2022: TSH 4.99 01/10/2023: BUN 29; Creatinine, Ser 1.58; Potassium 4.6; Sodium 137 01/14/2023: Hemoglobin 11.4; Platelets 119       Physical Exam:    VS:  BP (!) 133/57   Pulse 75   Temp (!) 97.2 F (36.2 C) (Temporal)   Resp 15   Ht 5' (1.524 m)   Wt 82.1 kg   SpO2 95%   BMI 35.35 kg/m     Wt Readings from Last 3 Encounters:  01/14/23 82.1 kg  01/10/23 84 kg  12/17/22 84 kg     GEN:  Well nourished, well developed in no acute distress CARDIAC: RRR, no murmurs, rubs, gallops RESPIRATORY:  Normal work of breathing MUSCULOSKELETAL: no edema    ASSESSMENT & PLAN:    Paroxysmal atrial fibrillation: she notes fatigue when in AF.  We have discussed the indication, rationale, logistics, anticipated benefits, and potential risks of the ablation procedure including but not limited to -- bleed at the groin access site, chest pain, damage to nearby organs such as the diaphragm, lungs, or esophagus, need for a drainage tube, or prolonged hospitalization. I explained that the risk for stroke, heart attack, need for open chest surgery, or even death is very low but not zero. she  expressed understanding and wishes to proceed.  Obesity: I reinforced her weight loss efforts. DM II CKD IV: precludes use of most AADs.          Medication Adjustments/Labs and Tests Ordered: Current  medicines are reviewed at length with the patient today.  Concerns regarding medicines are outlined above.  Orders Placed This Encounter  Procedures   CBC   Glucose, capillary   Informed Consent Details: Physician/Practitioner Attestation; Transcribe to consent form and obtain patient signature   Initiate Pre-op Protocol   Void on call to EP Lab   Confirm CBC and BMP (or CMP) results within 7 days for inpatient and 30 days for outpatient:   Clip right and left femoral area PM before surgery   Clip right internal jugular area PM before surgery   Pre-admission testing diagnosis   EP STUDY   Insert peripheral IV   Meds ordered this encounter  Medications   0.9 %  sodium chloride infusion   acetaminophen (TYLENOL) tablet 1,000 mg     Signed, Melida Quitter, MD  01/14/2023 7:21 AM    [place]

## 2023-01-14 NOTE — Anesthesia Procedure Notes (Signed)
Procedure Name: Intubation Date/Time: 01/14/2023 7:49 AM  Performed by: Janene Harvey, CRNAPre-anesthesia Checklist: Patient identified, Emergency Drugs available, Suction available and Patient being monitored Patient Re-evaluated:Patient Re-evaluated prior to induction Oxygen Delivery Method: Circle system utilized Preoxygenation: Pre-oxygenation with 100% oxygen Induction Type: IV induction Ventilation: Mask ventilation without difficulty and Oral airway inserted - appropriate to patient size Laryngoscope Size: Mac and 4 Grade View: Grade I Tube type: Oral Tube size: 7.0 mm Number of attempts: 1 Airway Equipment and Method: Stylet and Oral airway Placement Confirmation: ETT inserted through vocal cords under direct vision, positive ETCO2 and breath sounds checked- equal and bilateral Secured at: 20 cm Tube secured with: Tape Dental Injury: Teeth and Oropharynx as per pre-operative assessment

## 2023-01-14 NOTE — Discharge Instructions (Signed)

## 2023-01-17 ENCOUNTER — Encounter (HOSPITAL_COMMUNITY): Payer: Self-pay | Admitting: Cardiovascular Disease

## 2023-01-17 ENCOUNTER — Other Ambulatory Visit: Payer: Self-pay | Admitting: Cardiovascular Disease

## 2023-01-19 DIAGNOSIS — C9110 Chronic lymphocytic leukemia of B-cell type not having achieved remission: Secondary | ICD-10-CM | POA: Diagnosis not present

## 2023-01-19 DIAGNOSIS — E1122 Type 2 diabetes mellitus with diabetic chronic kidney disease: Secondary | ICD-10-CM | POA: Diagnosis not present

## 2023-01-19 DIAGNOSIS — N1832 Chronic kidney disease, stage 3b: Secondary | ICD-10-CM | POA: Diagnosis not present

## 2023-01-19 DIAGNOSIS — I129 Hypertensive chronic kidney disease with stage 1 through stage 4 chronic kidney disease, or unspecified chronic kidney disease: Secondary | ICD-10-CM | POA: Diagnosis not present

## 2023-01-19 DIAGNOSIS — E785 Hyperlipidemia, unspecified: Secondary | ICD-10-CM | POA: Diagnosis not present

## 2023-01-19 DIAGNOSIS — I4891 Unspecified atrial fibrillation: Secondary | ICD-10-CM | POA: Diagnosis not present

## 2023-01-28 ENCOUNTER — Encounter: Payer: Self-pay | Admitting: Internal Medicine

## 2023-02-03 ENCOUNTER — Encounter: Payer: Self-pay | Admitting: Hematology

## 2023-02-06 ENCOUNTER — Other Ambulatory Visit: Payer: Self-pay | Admitting: Internal Medicine

## 2023-02-11 ENCOUNTER — Encounter (HOSPITAL_COMMUNITY): Payer: Self-pay | Admitting: Internal Medicine

## 2023-02-11 ENCOUNTER — Ambulatory Visit (HOSPITAL_COMMUNITY)
Admission: RE | Admit: 2023-02-11 | Discharge: 2023-02-11 | Disposition: A | Discharge status: Home / Self Care | Payer: Medicare Other | Source: Ambulatory Visit | Attending: Physician Assistant | Admitting: Physician Assistant

## 2023-02-11 VITALS — BP 138/64 | HR 76 | Ht 60.0 in | Wt 180.8 lb

## 2023-02-11 DIAGNOSIS — I340 Nonrheumatic mitral (valve) insufficiency: Secondary | ICD-10-CM | POA: Diagnosis not present

## 2023-02-11 DIAGNOSIS — Z6835 Body mass index (BMI) 35.0-35.9, adult: Secondary | ICD-10-CM | POA: Insufficient documentation

## 2023-02-11 DIAGNOSIS — I10 Essential (primary) hypertension: Secondary | ICD-10-CM | POA: Diagnosis present

## 2023-02-11 DIAGNOSIS — Z7901 Long term (current) use of anticoagulants: Secondary | ICD-10-CM | POA: Insufficient documentation

## 2023-02-11 DIAGNOSIS — I4819 Other persistent atrial fibrillation: Secondary | ICD-10-CM | POA: Insufficient documentation

## 2023-02-11 DIAGNOSIS — E1122 Type 2 diabetes mellitus with diabetic chronic kidney disease: Secondary | ICD-10-CM | POA: Diagnosis not present

## 2023-02-11 DIAGNOSIS — E669 Obesity, unspecified: Secondary | ICD-10-CM | POA: Insufficient documentation

## 2023-02-11 DIAGNOSIS — D6869 Other thrombophilia: Secondary | ICD-10-CM | POA: Insufficient documentation

## 2023-02-11 DIAGNOSIS — Z794 Long term (current) use of insulin: Secondary | ICD-10-CM | POA: Insufficient documentation

## 2023-02-11 DIAGNOSIS — Z7984 Long term (current) use of oral hypoglycemic drugs: Secondary | ICD-10-CM | POA: Diagnosis not present

## 2023-02-11 DIAGNOSIS — N184 Chronic kidney disease, stage 4 (severe): Secondary | ICD-10-CM | POA: Diagnosis not present

## 2023-02-11 DIAGNOSIS — I129 Hypertensive chronic kidney disease with stage 1 through stage 4 chronic kidney disease, or unspecified chronic kidney disease: Secondary | ICD-10-CM | POA: Diagnosis not present

## 2023-02-11 NOTE — Progress Notes (Signed)
Primary Care Physician: Myrlene Broker, MD Primary Cardiologist: Dr. Mariah Milling Primary Electrophysiologist: Dr. Nelly Laurence Referring Physician:    [name] is a 72 y.o. female with a history of CAD, DM2, CLL, HTN, CKD IIIb-IV, and persistent atrial fibrillation who presents for consultation in the [facility]. The patient was initially diagnosed with atrial fibrillation in May 2022 and placed on metoprolol and Eliquis. She was started on amiodarone in June 2023. September 2023 cardiac monitor for lightheadedness and dizziness showed 27% AF burden and multiple pauses with longest lasting 8 seconds. Her carvedilol and amiodarone were discontinued. Follow up monitor did not show any significant pauses. She was seen by Cardiology on 10/14/22 for palpitations having coincided 2 days prior with initiation of levothyroxine. She was noted to be in Afib with RVR. She was sent to the ED and underwent successful cardioversion. Seen by Dr. Nelly Laurence 10/18/22 and after discussion agreed to proceed with ablation. She is now s/p Afib ablation on 01/14/23. Patient is on Eliquis 5 mg BID for a CHADS2VASC score of 5.  On follow up today, she is doing well s/p Afib ablation on 01/14/23. She is in SR today. No episodes of Afib since ablation. She checks her BP and HR daily and highest HR has been 84; her heart rate is usually in the 70s. No trouble swallowing, chest pain, or shortness of breath. Leg sites have healed without issue.   She is compliant with anticoagulation and has not missed any doses. She has no bleeding concerns.  Today, she denies symptoms of palpitations, chest pain, shortness of breath, orthopnea, PND, lower extremity edema, dizziness, presyncope, syncope, snoring, daytime somnolence, bleeding, or neurologic sequela. The patient is tolerating medications without difficulties and is otherwise without complaint today.   Atrial Fibrillation Risk Factors:  she does  not have a history of rheumatic fever. she does not have a history of alcohol use. The patient does not have a history of early familial atrial fibrillation or other arrhythmias.  she has a BMI of Body mass index is 35.31 kg/m.Marland Kitchen Filed Weights   02/11/23 1103  Weight: 82 kg    Family History  Problem Relation Age of Onset   Cancer Mother        breast cancer   Cancer Father        plasma sarcoma   Breast cancer Neg Hx      Atrial Fibrillation Management history:  Previous antiarrhythmic drugs: amiodarone Previous cardioversions: 10/14/22 Previous ablations: 01/14/23 Anticoagulation history: Eliquis 5 mg BID   Past Medical History:  Diagnosis Date   Anxiety    Arthritis    Atrial fibrillation    CLL (chronic lymphocytic leukemia)    Dx. 2019 Dr. 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 Sebastian Ache first visit 11-2019   Past Surgical History:  Procedure Laterality Date   ABDOMINAL HYSTERECTOMY  1998   ATRIAL FIBRILLATION ABLATION N/A 01/14/2023   Procedure: ATRIAL FIBRILLATION ABLATION;  Surgeon: Maurice Small, MD;  [facility]: [facility];  Service: Cardiovascular;  Laterality: N/A;   COLONOSCOPY     EYE SURGERY Bilateral     cataracts   EYE SURGERY Left    retina tear   FEMUR IM NAIL  10/25/2012   Procedure: INTRAMEDULLARY (IM) NAIL FEMORAL;  Surgeon: Shelda Pal, MD;  Location: [facility];  Service: Orthopedics;  Laterality: Left;   HIP SURGERY     Fracture L IM nail and 3 rods   left knee arthroscopy  12/2010   LYMPH NODE BIOPSY     axillary left 12-2018   REFRACTIVE SURGERY     skin cancer removed right and and lower forearm     squamoun in situ   TOTAL KNEE ARTHROPLASTY  12/20/2011   Procedure: TOTAL KNEE ARTHROPLASTY;  Surgeon: Loanne Drilling, MD;  Location: [facility];  Service: Orthopedics;  Laterality: Left;  Failed attempt at spinal    TOTAL SHOULDER ARTHROPLASTY Right 08/19/2016   Procedure: RIGHT TOTAL SHOULDER ARTHROPLASTY;  Surgeon: Francena Hanly, MD;  Location: [facility];  Service: Orthopedics;  Laterality: Right;   TOTAL SHOULDER ARTHROPLASTY Left 01/10/2020   Procedure: TOTAL SHOULDER ARTHROPLASTY;  Surgeon: Francena Hanly, MD;  Location: [facility];  Service: Orthopedics;  Laterality: Left;     Current Outpatient Medications  Medication Sig Dispense Refill   acetaminophen (TYLENOL) 500 MG tablet Take 500-1,000 mg by mouth every 6 (six) hours as needed for mild pain, moderate pain or fever.      ALPRAZolam (XANAX) 1 MG tablet Take 1 tablet (1 mg total) by mouth 2 (two) times daily as needed for anxiety. (Patient taking differently: Take 1 mg by mouth 2 (two) times daily.) 60 tablet 5   amoxicillin (AMOXIL) 500 MG capsule Take 2,000 mg by mouth See admin instructions. For dental appointment     apixaban (ELIQUIS) 5 MG TABS tablet Take 1 tablet (5 mg total) by mouth 2 (two) times daily. 60 tablet 5   buPROPion (WELLBUTRIN XL) 300 MG 24 hr tablet TAKE 1 TABLET(300 MG) BY MOUTH DAILY 90 tablet 3   Cholecalciferol (VITAMIN D3) 2000 units TABS Take 2,000 mcg by mouth daily.     Cyanocobalamin (B-12) 1000 MCG SUBL Place 1,000 mcg under the tongue every evening.      doxazosin (CARDURA) 2 MG tablet TAKE 1  TABLET(2 MG) BY MOUTH EVERY EVENING 90 tablet 0   esomeprazole (NEXIUM) 20 MG capsule Take 20 mg by mouth every Monday, Wednesday, and Friday.     ezetimibe (ZETIA) 10 MG tablet TAKE 1 TABLET(10 MG) BY MOUTH DAILY 90 tablet 1   insulin glargine (LANTUS SOLOSTAR) 100 UNIT/ML Solostar Pen Inject 14-16 Units into the skin daily. (Patient taking differently: Inject 18 Units into the skin at bedtime.) 30 mL 3   iron polysaccharides (NIFEREX) 150 MG capsule Take 150 mg by mouth See admin instructions. Tue Thu and Sat     JARDIANCE 10 MG TABS tablet TAKE 1 TABLET(10 MG) BY MOUTH DAILY BEFORE BREAKFAST 30 tablet 11  levothyroxine (SYNTHROID) 25 MCG tablet Take 1 tablet (25 mcg total) by mouth daily before breakfast. 30 tablet 5   lisinopril (ZESTRIL) 20 MG tablet Take 1 tablet (20 mg total) by mouth 2 (two) times daily. 180 tablet 3   pantoprazole (PROTONIX) 40 MG tablet Take 1 tablet (40 mg total) by mouth daily. 45 tablet 0   simvastatin (ZOCOR) 40 MG tablet TAKE 1 TABLET(40 MG) BY MOUTH AT BEDTIME 90 tablet 3   BD PEN NEEDLE NANO 2ND GEN 32G X 4 MM MISC USE DAILY AS DIRECTED 100 each 3   Blood Glucose Monitoring Suppl (ONETOUCH VERIO) w/Device KIT Use as advised 1 kit 0   glucose blood (ONETOUCH VERIO) test strip USE TO TEST BLOOD GLUCOSE THREE TIMES DAILY. 300 strip 1   Lancet Devices (LANCING DEVICE) MISC Use as advised - Freestyle 1 each 0   Lancets (ONETOUCH DELICA PLUS LANCET30G) MISC TEST BLOOD SUGAR THREE TIMES DAILY AS DIRECTED 300 each 1   NONFORMULARY OR COMPOUNDED ITEM Apply 1 application topically 2 (two) times daily as needed (sun damage on face). FLUOROURACIL 5% + CALCIPOTRIENE 0.005%     triamcinolone cream (KENALOG) 0.1 % Apply 1 application topically 2 (two) times daily as needed (skin rash.).      No current facility-administered medications for this encounter.    Allergies  Allergen Reactions   Carvedilol Other (See Comments)    Sinus Pauses   Gadolinium Derivatives Other (See  Comments)   Nsaids Other (See Comments)    Due to kidney function   Amiodarone Other (See Comments)    Causes bradycardia and decreased thyroid function   Amlodipine     Swelling-  leg swell    Beta Adrenergic Blockers     REACTION: Decreased heart rate   Contrast Media [Iodinated Contrast Media] Other (See Comments)    Patient states unable to take / use due to kidney function, NOT allergy!   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    Employer: [employer]  Tobacco Use   Smoking status: Never   Smokeless tobacco: Never  Vaping Use   Vaping Use: Never used  Substance and Sexual Activity   Alcohol use: No   Drug use: No   Sexual activity: Not Currently  Other Topics Concern   Not on file  Social History Narrative   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   Social Determinants of Health   Financial Resource Strain: Low Risk  (03/10/2022)   Overall Financial Resource Strain (CARDIA)    Difficulty of Paying Living Expenses: Not hard at all  Food Insecurity: No Food Insecurity (08/24/2022)   Hunger Vital Sign    Worried About Running Out of Food in the Last Year: Never true    Ran Out of Food in the Last Year: Never true  Transportation Needs: No Transportation Needs (08/24/2022)   PRAPARE - Administrator, Civil Service (Medical): No    Lack of Transportation (Non-Medical): No  Physical Activity: Inactive (03/10/2022)   Exercise Vital Sign    Days of Exercise per Week: 0 days    Minutes of Exercise per Session: 0 min  Stress: No Stress Concern Present (03/10/2022)   [employer] of Occupational Health - Occupational Stress Questionnaire    Feeling of Stress : Not at all  Social Connections:  Moderately Isolated (03/10/2022)   Social Connection and Isolation Panel [NHANES]    Frequency of Communication with Friends and Family: More than three times a week    Frequency of Social Gatherings with Friends and Family: More than three times a week    Attends Religious Services: More than 4 times per year    Active Member of [employer] or Organizations: No    Attends Banker Meetings: Never    Marital Status: Never married  Intimate Partner Violence: Not At Risk (03/10/2022)   Humiliation, Afraid, Rape, and Kick questionnaire    Fear of Current or Ex-Partner: No    Emotionally Abused: No    Physically Abused: No    Sexually Abused: No     ROS- All systems are reviewed and negative except as per the HPI above.  Physical Exam: Vitals:   02/11/23 1103  BP: 138/64  Pulse: 76  Weight: 82 kg  Height: 5' (1.524 m)    GEN- The patient is well appearing, alert and oriented x 3 today.   Head- normocephalic, atraumatic Eyes-  Sclera clear, conjunctiva pink Ears- hearing intact Oropharynx- clear Neck- supple, no JVP Lymph- no cervical lymphadenopathy Lungs- Clear to ausculation bilaterally, normal work of breathing Heart- Regular rate and rhythm, no murmurs, rubs or gallops, PMI not laterally displaced GI- soft, NT, ND, + BS Extremities- no clubbing, cyanosis, or edema MS- no significant deformity or atrophy Skin- no rash or lesion Psych- euthymic mood, full affect Neuro- strength and sensation are intact   Wt Readings from Last 3 Encounters:  02/11/23 82 kg  01/14/23 82.1 kg  01/10/23 84 kg    EKG today demonstrates NSR RAD HR 76 PR 192 ms QRS 78 ms QT/Qtc 388/436 ms  Echo 05/22/21 demonstrated:  1. Left ventricular ejection fraction, by estimation, is 60 to 65%. The  left ventricle has normal function. The left ventricle has no regional  wall motion abnormalities. Left ventricular diastolic parameters were  normal.   2. Right ventricular systolic  function is normal. The right ventricular  size is normal.   3. The mitral valve is normal in structure. Mild mitral valve  regurgitation.  Epic records are reviewed at length today.  CHA2DS2-VASc Score = 5  The patient's score is based upon: CHF History: 0 HTN History: 1 Diabetes History: 1 Stroke History: 0 Vascular Disease History: 1 Age Score: 1 Gender Score: 1       ASSESSMENT AND PLAN: Persistent Atrial Fibrillation (ICD10:  I48.19) The patient's CHA2DS2-VASc score is 5, indicating a 7.2% annual risk of stroke.   S/p Afib ablation on 01/17/23.  She is in SR today. She is doing well overall. No changes to current regimen indicated.  Secondary Hypercoagulable State (ICD10:  D68.69) The patient is at significant risk for stroke/thromboembolism based upon her CHA2DS2-VASc Score of 5.  Continue Apixaban (Eliquis).   No missed doses.  3. Obesity Body mass index is 35.31 kg/m. Lifestyle modification was discussed at length including regular exercise and weight reduction. Encouraged daily walking.  4. HTN Stable today; continue current regimen.   Follow up as scheduled with Dr. Nelly Laurence.    Lake Bells, PA-C Af[facility] [address] [place], [place] [address] [phone] 02/11/2023 11:34 AM

## 2023-02-13 ENCOUNTER — Other Ambulatory Visit: Payer: Self-pay | Admitting: Internal Medicine

## 2023-02-16 ENCOUNTER — Other Ambulatory Visit (HOSPITAL_COMMUNITY): Payer: Self-pay

## 2023-02-22 NOTE — Progress Notes (Signed)
Patient ID: Brenda [name], female   DOB: November 10, 1950, 72 y.o.   MRN: [id number] Cardiology Office Note  Date:  02/28/2023   ID:  Brenda [name], Brenda [name] 13-Jun-1951, MRN [id number]  PCP:  Myrlene Broker, MD   Chief Complaint  Patient presents with   4 month follow up     "Doing well." Medications reviewed by the patient verbally.     HPI:  Brenda [name] is a 72 year old woman with history of coronary artery disease, CT coronary calcium score 1031 obesity, diabetes type II,  hemoglobin A1c previously 12 , now 6.1 hyperlipidemia,  with previous stress test and echocardiogram for abnormal EKG,  CLL, chronically elevated WBC 22 s/p shoulder replacement surgery Chronic renal sufficiency/nephropathy,. 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  Last seen by myself in clinic   September 2023, she complained of lightheadedness and dizziness. A Zio patch was placed that showed sinus pauses up to about 8 seconds, so her Coreg and amiodarone were discontinued. Additionally, her TSH was elevated at 11.7. Pacemaker placement was deferred with a reversible cause.  Amiodarone stopped  Seen by Dr. Okey Dupre Oct 14, 2022,in clinic Went to the ER for cardioversion   not a candidate for most AADs due to renal dysfunction, CAD  Ablation 01/14/23 Has been doing well,   BP well controlled Feels well, denies chest pain, SOB  A1C 6.1, weight down  EKG personally reviewed by myself on todays visit NSR rate 82 bpm, no ST or T wave changes  Other past medical history reviewed  atrial fibrillation on office visit May 2022, had mild shortness of breath A-fib on EKG Was started on Eliquis at that time, metoprolol succinate 50 twice  daily   Chronic back pain  CT scan LM and 3 vessel coronary calcium score 2568 which is 99 th percentile for age/sex Severe aortic atherosclerosis involving the arch and descending thoracic aorta   Other past medical hx reviewed In hospital 06/2020,   fall, fractured ribs, dehydration, acute renal failure  weakness and dizziness  acute kidney injury and has a serum creatinine of 4.63 compared to baseline of 1.67. Fever of 101.  So far cultures are negative.  Stool for C. difficile negative.  Patient on Rocephin and Flagyl Treated with midodrine  follow-up with Dr. Signe Colt from [facility]  PT recommended home with home services 74-hour supervision Was very SOB for weeks after she got home   hospital 11/2017 Dehydrated Lung nodules, enlarged lymph nodes Anemic  HBG 6.6 after hydration, iron def, stool negative BP was low. 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  PSH:    Past Surgical History:  Procedure Laterality Date   ABDOMINAL HYSTERECTOMY  1998   ATRIAL FIBRILLATION ABLATION N/A 01/14/2023   Procedure: ATRIAL FIBRILLATION ABLATION;  Surgeon: Maurice Small, MD;  [facility];  Service: Cardiovascular;  Laterality: N/A;   COLONOSCOPY     EYE SURGERY Bilateral    cataracts   EYE SURGERY Left    retina tear   FEMUR IM NAIL  10/25/2012   Procedure: INTRAMEDULLARY (IM) NAIL FEMORAL;  Surgeon:  Shelda Pal, MD;  Location: [facility];  Service: Orthopedics;  Laterality: Left;   HIP SURGERY     Fracture L IM nail and 3 rods   left knee arthroscopy  12/2010   LYMPH NODE BIOPSY     axillary left 12-2018   REFRACTIVE SURGERY     skin cancer removed right and and lower forearm     squamoun in situ   TOTAL KNEE ARTHROPLASTY  12/20/2011   Procedure: TOTAL KNEE ARTHROPLASTY;  Surgeon: Loanne Drilling, MD;  Location: [facility];  Service: Orthopedics;  Laterality: Left;  Failed attempt at spinal    TOTAL SHOULDER ARTHROPLASTY Right 08/19/2016   Procedure: RIGHT TOTAL SHOULDER ARTHROPLASTY;  Surgeon: Francena Hanly, MD;  Location: [facility];  Service: Orthopedics;  Laterality: Right;   TOTAL SHOULDER ARTHROPLASTY Left 01/10/2020   Procedure: TOTAL SHOULDER ARTHROPLASTY;  Surgeon: Francena Hanly, MD;  Location: [facility];  Service: Orthopedics;  Laterality: Left;     Current Outpatient Medications  Medication Sig Dispense Refill   acetaminophen (TYLENOL) 500 MG tablet Take 500-1,000 mg by mouth every 6 (six) hours as needed for mild pain, moderate pain or fever.      ALPRAZolam (XANAX) 1 MG tablet Take 1 tablet (1 mg total) by mouth 2 (two) times daily as needed for anxiety. 60 tablet 5   amoxicillin (AMOXIL) 500 MG capsule Take 2,000 mg by mouth See admin instructions. For dental appointment     apixaban (ELIQUIS) 5 MG TABS tablet Take 1 tablet (5 mg total) by mouth 2 (two) times daily. 60 tablet 5   BD PEN NEEDLE NANO 2ND GEN 32G X 4 MM MISC USE DAILY AS DIRECTED 100 each 3   Blood Glucose Monitoring Suppl (ONETOUCH VERIO) w/Device KIT Use as advised 1 kit 0   buPROPion (WELLBUTRIN XL) 300 MG 24 hr tablet TAKE 1 TABLET(300 MG) BY MOUTH DAILY 90 tablet 3   Cholecalciferol (VITAMIN D3) 2000 units TABS Take 2,000 mcg by mouth daily.     Cyanocobalamin (B-12) 1000 MCG SUBL Place 1,000 mcg under the tongue every evening.      doxazosin (CARDURA) 2 MG tablet TAKE 1 TABLET(2 MG) BY MOUTH EVERY EVENING 90  tablet 0   esomeprazole (NEXIUM) 20 MG capsule Take 20 mg by mouth every Monday, Wednesday, and Friday.     ezetimibe (ZETIA) 10 MG tablet TAKE 1 TABLET(10 MG) BY MOUTH DAILY 90 tablet 1   glucose blood (ONETOUCH VERIO) test strip USE TO TEST BLOOD GLUCOSE THREE TIMES DAILY. 300 strip 1   insulin glargine (LANTUS SOLOSTAR) 100 UNIT/ML Solostar Pen 14-16 units after dinner 30 mL 3   iron polysaccharides (NIFEREX) 150 MG capsule Take 150 mg by mouth See admin instructions. Tue Thu and Sat     JARDIANCE 10 MG TABS tablet TAKE 1 TABLET(10 MG) BY MOUTH DAILY BEFORE BREAKFAST 30 tablet 11   Lancet Devices (LANCING DEVICE) MISC Use as advised - Freestyle 1 each 0   Lancets (ONETOUCH DELICA PLUS LANCET30G) MISC TEST BLOOD SUGAR THREE TIMES DAILY AS DIRECTED 300 each 1   levothyroxine (SYNTHROID) 25 MCG tablet Take 1 tablet (25 mcg total) by mouth daily before breakfast. 30  tablet 5   lisinopril (ZESTRIL) 20 MG tablet Take 1 tablet (20 mg total) by mouth 2 (two) times daily. 180 tablet 3   NONFORMULARY OR COMPOUNDED ITEM Apply 1 application topically 2 (two) times daily as needed (sun damage on face). FLUOROURACIL 5% + CALCIPOTRIENE 0.005%     simvastatin (ZOCOR) 40 MG tablet TAKE 1 TABLET(40 MG) BY MOUTH AT BEDTIME 90 tablet 3   triamcinolone cream (KENALOG) 0.1 % Apply 1 application topically 2 (two) times daily as needed (skin rash.).      No current facility-administered medications for this visit.    Allergies:   Carvedilol, Gadolinium derivatives, Nsaids, Amiodarone, Amlodipine, Beta adrenergic blockers, Contrast media [iodinated contrast media], and Gabapentin   Social History:  The patient  reports that she has never smoked. She has never used smokeless tobacco. She reports that she does not drink alcohol and does not use drugs.   Family History:   family history includes Cancer in her father and mother.    Review of Systems: Review of Systems  Constitutional: Negative.   HENT: Negative.     Respiratory: Negative.    Cardiovascular: Negative.   Gastrointestinal: Negative.   Musculoskeletal:  Positive for back pain.  Neurological: Negative.   Psychiatric/Behavioral: Negative.  Suicidal ideas: .   All other systems reviewed and are negative.   PHYSICAL EXAM: VS:  BP (!) 110/52 (BP Location: Left Arm, Patient Position: Sitting, Cuff Size: Normal)   Pulse 82   Ht 5\' 1"  (1.549 m)   Wt 178 lb 4 oz (80.9 kg)   SpO2 98%   BMI 33.68 kg/m  , BMI Body mass index is 33.68 kg/m. Constitutional:  oriented to person, place, and time. No distress.  HENT:  Head: Grossly normal Eyes:  no discharge. No scleral icterus.  Neck: No JVD, no carotid bruits  Cardiovascular: Regular rate and rhythm, no murmurs appreciated Pulmonary/Chest: Clear to auscultation bilaterally, no wheezes or rails Abdominal: Soft.  no distension.  no tenderness.  Musculoskeletal: Normal range of motion Neurological:  normal muscle tone. Coordination normal. No atrophy Skin: Skin warm and dry Psychiatric: normal affect, pleasant  Recent Labs: 10/14/2022: Magnesium 2.0 12/17/2022: ALT 10 12/28/2022: TSH 4.99 01/10/2023: BUN 29; Creatinine, Ser 1.58; Potassium 4.6; Sodium 137 01/14/2023: Hemoglobin 11.4; Platelets 119    Lipid Panel Lab Results  Component Value Date   CHOL 104 07/31/2022   HDL 21 (L) 07/31/2022   LDLCALC 44 07/31/2022   TRIG 193 (H) 07/31/2022      Wt Readings from Last 3 Encounters:  02/28/23 178 lb 4 oz (80.9 kg)  02/11/23 180 lb 12.4 oz (82 kg)  01/14/23 181 lb (82.1 kg)     ASSESSMENT AND PLAN:  Essential hypertension -  Blood pressure is well controlled on today's visit. No changes made to the medications.  Atrial fibrillation with RVR Ablation 12/2022, maintinaing NSr Not on rate or rhythm medications  Type 2 diabetes mellitus with hyperglycemia, without long-term current use of insulin (HCC) Follows with endocrine, weight down A1C down 6.1 Lifestyle modification  recommended, low carbohydrate diet Has diabetic nephropathy creatinine 1.5  morbid obesity Weight down, sedentary  HLD (hyperlipidemia) Tolerating simvastatin, zetia Cholesterol is at goal on the current lipid regimen. No changes to the medications were made.  Anemia History of iron deficiency, followed by primary care Hemoglobin stable Chronic leukocytosis, CLL  Chronic renal failure Followed by nephrology CR 1.5 Prior history of poorly controlled diabetes   Total encounter time more than 30  minutes  Greater than 50% was spent in counseling and coordination of care with the patient   No orders of the defined types were placed in this encounter.  Signed, Dossie Arbour, M.D., Ph.D. 02/28/2023  [facility] [place], [place] [phone]

## 2023-02-28 ENCOUNTER — Encounter: Payer: Self-pay | Admitting: Cardiovascular Disease

## 2023-02-28 ENCOUNTER — Ambulatory Visit: Payer: Medicare Other | Attending: Cardiovascular Disease | Admitting: Cardiovascular Disease

## 2023-02-28 VITALS — BP 110/52 | HR 82 | Ht 61.0 in | Wt 178.2 lb

## 2023-02-28 DIAGNOSIS — I455 Other specified heart block: Secondary | ICD-10-CM

## 2023-02-28 DIAGNOSIS — E039 Hypothyroidism, unspecified: Secondary | ICD-10-CM | POA: Diagnosis not present

## 2023-02-28 DIAGNOSIS — E785 Hyperlipidemia, unspecified: Secondary | ICD-10-CM | POA: Diagnosis not present

## 2023-02-28 DIAGNOSIS — I2510 Atherosclerotic heart disease of native coronary artery without angina pectoris: Secondary | ICD-10-CM | POA: Diagnosis not present

## 2023-02-28 DIAGNOSIS — N1832 Chronic kidney disease, stage 3b: Secondary | ICD-10-CM

## 2023-02-28 DIAGNOSIS — I10 Essential (primary) hypertension: Secondary | ICD-10-CM | POA: Diagnosis not present

## 2023-02-28 DIAGNOSIS — I4819 Other persistent atrial fibrillation: Secondary | ICD-10-CM

## 2023-02-28 DIAGNOSIS — I495 Sick sinus syndrome: Secondary | ICD-10-CM

## 2023-02-28 DIAGNOSIS — D6869 Other thrombophilia: Secondary | ICD-10-CM

## 2023-02-28 NOTE — Patient Instructions (Signed)
Medication Instructions:  No changes  If you need a refill on your cardiac medications before your next appointment, please call your pharmacy.    Lab work: No new labs needed   Testing/Procedures: No new testing needed   Follow-Up: At [facility], you and your health needs are our priority.  As part of our continuing mission to provide you with exceptional heart care, we have created designated Provider Care Teams.  These Care Teams include your primary Cardiologist (physician) and Advanced Practice Providers (APPs -  Physician Assistants and Nurse Practitioners) who all work together to provide you with the care you need, when you need it.  You will need a follow up appointment in 6 months  Providers on your designated Care Team:   Christopher Berge, NP Ryan Dunn, PA-C Cadence Furth, PA-C  COVID-19 Vaccine Information can be found at: [email or web address] For questions related to vaccine distribution or appointments, please email [email or web address] or call [phone].   

## 2023-03-03 ENCOUNTER — Other Ambulatory Visit: Payer: Self-pay | Admitting: Cardiovascular Disease

## 2023-03-08 ENCOUNTER — Other Ambulatory Visit: Payer: Self-pay | Admitting: *Deleted

## 2023-03-08 DIAGNOSIS — I4819 Other persistent atrial fibrillation: Secondary | ICD-10-CM

## 2023-03-08 MED ORDER — APIXABAN 5 MG PO TABS: 5.0000 mg | ORAL_TABLET | Freq: Two times a day (BID) | ORAL | 5 refills | Status: DC

## 2023-03-08 NOTE — Telephone Encounter (Signed)
Prescription refill request for Eliquis received. Indication: afib  Last office visit: [place] 02/28/2023 Scr: 01/19/2023, 1.59 Age: 72 yo  Weight: 80.9 kg   Refill sent.

## 2023-03-12 ENCOUNTER — Other Ambulatory Visit: Payer: Self-pay | Admitting: Physician Assistant

## 2023-03-17 ENCOUNTER — Telehealth: Payer: Self-pay

## 2023-03-17 NOTE — Telephone Encounter (Signed)
Contacted Brenda [name] to schedule their annual wellness visit. Appointment made for 03/30/23.

## 2023-03-30 ENCOUNTER — Ambulatory Visit (INDEPENDENT_AMBULATORY_CARE_PROVIDER_SITE_OTHER): Payer: Medicare Other

## 2023-03-30 VITALS — Ht 61.0 in | Wt 175.0 lb

## 2023-03-30 DIAGNOSIS — Z0000 Encounter for general adult medical examination without abnormal findings: Secondary | ICD-10-CM | POA: Diagnosis not present

## 2023-03-30 NOTE — Progress Notes (Signed)
I connected with  Brenda [name] on 03/30/23 by a audio enabled telemedicine application and verified that I am speaking with the correct person using two identifiers.  Patient Location: Home  Provider Location: Office/Clinic  I discussed the limitations of evaluation and management by telemedicine. The patient expressed understanding and agreed to proceed.  Patient Medicare AWV questionnaire was completed by the patient on 03/30/2023; I have confirmed that all information answered by patient is correct and no changes since this date.    Subjective:   Brenda [name] is a 72 y.o. female who presents for Medicare Annual (Subsequent) preventive examination.  Review of Systems     Cardiac Risk Factors include: advanced age (>78men, >15 women);diabetes mellitus;dyslipidemia;hypertension;obesity (BMI >30kg/m2);sedentary lifestyle     Objective:    Today's Vitals   03/30/23 0920  Weight: 175 lb (79.4 kg)  Height: 5\' 1"  (1.549 m)  PainSc: 5   PainLoc: Back   Body mass index is 33.07 kg/m.     03/30/2023    9:34 AM 01/14/2023    6:12 AM 10/14/2022    2:19 PM 07/31/2022    5:05 AM 03/10/2022   11:33 AM 05/20/2021   11:51 AM 10/14/2020    3:10 PM  Advanced Directives  Does Patient Have a Medical Advance Directive? Yes Yes Yes No Yes Yes Yes  Type of Estate agent of [place];Living will Healthcare Power of [place];Living will   Living will;Healthcare Power of [employer] Power of [place];Living will Healthcare Power of [place];Living will  Does patient want to make changes to medical advance directive?  No - Patient declined   No - Patient declined No - Patient declined   Copy of Healthcare Power of Attorney in Chart? No - copy requested No - copy requested   No - copy requested  No - copy requested    Current Medications (verified) Outpatient Encounter Medications as of 03/30/2023  Medication Sig   acetaminophen (TYLENOL) 500 MG tablet Take  500-1,000 mg by mouth every 6 (six) hours as needed for mild pain, moderate pain or fever.    ALPRAZolam (XANAX) 1 MG tablet Take 1 tablet (1 mg total) by mouth 2 (two) times daily as needed for anxiety.   amoxicillin (AMOXIL) 500 MG capsule Take 2,000 mg by mouth See admin instructions. For dental appointment   apixaban (ELIQUIS) 5 MG TABS tablet Take 1 tablet (5 mg total) by mouth 2 (two) times daily.   BD PEN NEEDLE NANO 2ND GEN 32G X 4 MM MISC USE DAILY AS DIRECTED   Blood Glucose Monitoring Suppl (ONETOUCH VERIO) w/Device KIT Use as advised   buPROPion (WELLBUTRIN XL) 300 MG 24 hr tablet TAKE 1 TABLET(300 MG) BY MOUTH DAILY   Cholecalciferol (VITAMIN D3) 2000 units TABS Take 2,000 mcg by mouth daily.   Cyanocobalamin (B-12) 1000 MCG SUBL Place 1,000 mcg under the tongue every evening.    doxazosin (CARDURA) 2 MG tablet TAKE 1 TABLET(2 MG) BY MOUTH EVERY EVENING   esomeprazole (NEXIUM) 20 MG capsule Take 20 mg by mouth every Monday, Wednesday, and Friday.   ezetimibe (ZETIA) 10 MG tablet TAKE 1 TABLET(10 MG) BY MOUTH DAILY   glucose blood (ONETOUCH VERIO) test strip USE TO TEST BLOOD GLUCOSE THREE TIMES DAILY.   insulin glargine (LANTUS SOLOSTAR) 100 UNIT/ML Solostar Pen 14-16 units after dinner   iron polysaccharides (NIFEREX) 150 MG capsule Take 150 mg by mouth See admin instructions. Tue Thu and Sat   JARDIANCE 10 MG TABS tablet  TAKE 1 TABLET(10 MG) BY MOUTH DAILY BEFORE BREAKFAST   Lancet Devices (LANCING DEVICE) MISC Use as advised - Freestyle   Lancets (ONETOUCH DELICA PLUS LANCET30G) MISC TEST BLOOD SUGAR THREE TIMES DAILY AS DIRECTED   levothyroxine (SYNTHROID) 25 MCG tablet Take 1 tablet (25 mcg total) by mouth daily before breakfast.   lisinopril (ZESTRIL) 20 MG tablet Take 1 tablet (20 mg total) by mouth 2 (two) times daily.   NONFORMULARY OR COMPOUNDED ITEM Apply 1 application topically 2 (two) times daily as needed (sun damage on face). FLUOROURACIL 5% + CALCIPOTRIENE 0.005%    simvastatin (ZOCOR) 40 MG tablet TAKE 1 TABLET(40 MG) BY MOUTH AT BEDTIME   triamcinolone cream (KENALOG) 0.1 % Apply 1 application topically 2 (two) times daily as needed (skin rash.).    No facility-administered encounter medications on file as of 03/30/2023.    Allergies (verified) Carvedilol, Gadolinium derivatives, Nsaids, Amiodarone, Amlodipine, Beta adrenergic blockers, Contrast media [iodinated contrast media], and Gabapentin   History: Past Medical History:  Diagnosis Date   Anxiety    Arthritis    Atrial fibrillation (HCC)    CLL (chronic lymphocytic leukemia) (HCC)    Dx. 2019 Dr. 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 Sebastian Ache first visit 11-2019   Past Surgical History:  Procedure Laterality Date   ABDOMINAL HYSTERECTOMY  1998   ATRIAL FIBRILLATION ABLATION N/A 01/14/2023   Procedure: ATRIAL FIBRILLATION ABLATION;  Surgeon: Maurice Small, MD;  Location: [facility] INVASIVE CV LAB;  Service: Cardiovascular;  Laterality: N/A;   COLONOSCOPY     EYE SURGERY Bilateral    cataracts   EYE SURGERY Left    retina tear   FEMUR IM NAIL  10/25/2012   Procedure: INTRAMEDULLARY (IM) NAIL FEMORAL;  Surgeon: Shelda Pal, MD;  Location: [facility]  ORS;  Service: Orthopedics;  Laterality: Left;   HIP SURGERY     Fracture L IM nail and 3 rods   left knee arthroscopy  12/2010   LYMPH NODE BIOPSY     axillary left 12-2018   REFRACTIVE SURGERY     skin cancer removed right and and lower forearm     squamoun in situ   TOTAL KNEE ARTHROPLASTY  12/20/2011   Procedure: TOTAL KNEE ARTHROPLASTY;  Surgeon: Loanne Drilling, MD;  Location: [facility] ORS;  Service: Orthopedics;  Laterality: Left;  Failed attempt at spinal    TOTAL SHOULDER ARTHROPLASTY Right 08/19/2016   Procedure: RIGHT TOTAL SHOULDER ARTHROPLASTY;  Surgeon: Francena Hanly, MD;  Location: [facility] OR;  Service: Orthopedics;  Laterality: Right;   TOTAL SHOULDER ARTHROPLASTY Left 01/10/2020   Procedure: TOTAL SHOULDER ARTHROPLASTY;  Surgeon: Francena Hanly, MD;  Location: [facility] ORS;  Service: Orthopedics;  Laterality: Left;    Family History  Problem Relation Age of Onset   Cancer Mother        breast cancer   Cancer Father        plasma sarcoma   Breast cancer Neg Hx    Social History   Socioeconomic History   Marital status: Single    Spouse name: Not on file   Number of children: Not on file   Years of education: Not on file   Highest education level: Not on file  Occupational History   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    Employer: [employer]  Tobacco Use   Smoking status: Never   Smokeless tobacco: Never  Vaping Use   Vaping Use: Never used  Substance and Sexual Activity   Alcohol use: No   Drug use: No   Sexual activity: Not Currently  Other Topics Concern   Not on file  Social History Narrative   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   Social Determinants of Health   Financial Resource Strain: Low Risk  (03/30/2023)   Overall Financial Resource Strain (CARDIA)    Difficulty of Paying Living Expenses: Not hard at all  Food Insecurity: No Food Insecurity (03/30/2023)   Hunger Vital Sign    Worried About Running Out of Food in the Last Year: Never true    Ran Out of Food  in the Last Year: Never true  Transportation Needs: No Transportation Needs (03/30/2023)   PRAPARE - Administrator, Civil Service (Medical): No    Lack of Transportation (Non-Medical): No  Physical Activity: Patient Declined (03/30/2023)   Exercise Vital Sign    Days of Exercise per Week: Patient declined    Minutes of Exercise per Session: Patient declined  Stress: Stress Concern Present (03/30/2023)   [employer] of Occupational Health - Occupational Stress Questionnaire    Feeling of Stress : To some extent  Social Connections: Unknown (03/30/2023)   Social Connection and Isolation Panel [NHANES]    Frequency of Communication with Friends and Family: Three times a week    Frequency of Social Gatherings with Friends and Family: Patient declined    Attends Religious Services: Not on Marketing executive or Organizations: Yes    Attends Engineer, structural: More than 4 times per year    Marital Status: Never married    Tobacco Counseling Counseling given: Not Answered   Clinical Intake:  Pre-visit preparation completed: Yes  Pain : No/denies pain Pain Score: 5      BMI - recorded: 33.07 Nutritional Status: BMI > 30  Obese Nutritional Risks: None Diabetes: No  How often do you need to have someone help you when you read instructions, pamphlets, or other written materials from your doctor or pharmacy?: 1 - Never What is the last grade level you completed in school?: Bachelor's Degree in Nursing  Nutrition Risk Assessment:  Has the patient had any N/V/D within the last 2 months?  No  Does the patient have any non-healing wounds?  No  Has the patient had any unintentional weight loss or weight gain?  No   Diabetes:  Is the patient diabetic?  Yes  If diabetic, was a CBG obtained today?  Yes  fasting 112 Did the patient bring  in their glucometer from home?  No  How often do you monitor your CBG's? 3 times daily.   Financial Strains  and Diabetes Management:  Are you having any financial strains with the device, your supplies or your medication? No .  Does the patient want to be seen by Chronic Care Management for management of their diabetes?  No  Would the patient like to be referred to a Nutritionist or for Diabetic Management?  No   Diabetic Exams:  Diabetic Eye Exam: Completed 03/30/2022 Diabetic Foot Exam: Completed 03/23/2022 ; overdue   Interpreter Needed?: No  Information entered by :: Britiny Defrain N. Deyja Sochacki, LPN.   Activities of Daily Living    03/30/2023    9:27 AM 03/30/2023    6:52 AM  In your present state of health, do you have any difficulty performing the following activities:  Hearing? 0 0  Vision? 0 0  Difficulty concentrating or making decisions? 0 0  Walking or climbing stairs? 0 0  Dressing or bathing? 0 0  Doing errands, shopping? 0 0  Preparing Food and eating ? N N  Using the Toilet? N N  In the past six months, have you accidently leaked urine? Y Y  Do you have problems with loss of bowel control? N N  Managing your Medications? N N  Managing your Finances? N N  Housekeeping or managing your Housekeeping? N N    Patient Care Team: Myrlene Broker, MD as PCP - General (Internal Medicine) Mariah Milling Tollie Pizza, MD as PCP - Cardiology (Cardiology) Mealor, Roberts Gaudy, MD as PCP - Electrophysiology (Cardiology) Antonieta Iba, MD as Consulting Physician (Cardiology)  Indicate any recent Medical Services you may have received from other than [facility] providers in the past year (date may be approximate).     Assessment:   This is a routine wellness examination for Brenda [name].  Hearing/Vision screen Hearing Screening - Comments:: Patient denied any hearing difficulty.   No hearing aids.  Vision Screening - Comments:: Patient does wear otc readers.  Eye exam done by: Maris Berger, MD.   Dietary issues and exercise activities discussed: Current Exercise Habits: The patient  does not participate in regular exercise at present, Exercise limited by: orthopedic condition(s);cardiac condition(s)   Goals Addressed             This Visit's Progress    My goal for 2024 is to stay alive and well.       Patient Self-Care Activities       Timeframe:  Long-Range Goal Priority:  High Start Date:          01/28/22                   Expected End Date:       01/29/2024                Patient Brenda [name] will: Take medications as prescribed Attend all scheduled provider appointments Call pharmacy for medicatiron refills Call provider office for new concens or questions Continue to check fasting (first thing in the morning, before eating) and later in day blood sugars at home as you have been Continue to check blood pressures at home as you have been Continue to maintain close communication with your cardiologist since you are currently experiencing episodes of A-Fib Continue to follow heart healthy, low salt, low cholesterol, carbohydrate-modified, low sugar diet Keep up the  great work at preventing recent falls- take your time, don't rush; don't over-do with activity,  pace yourself      Depression Screen    03/30/2023    9:33 AM 11/02/2022    8:16 AM 03/10/2022   11:51 AM 01/28/2022   10:00 AM 10/16/2021   11:00 AM 05/20/2021   12:02 PM 10/14/2020    3:12 PM  PHQ 2/9 Scores  PHQ - 2 Score 0 0 0 1  5 1   PHQ- 9 Score 0 0    12   Exception Documentation     Other- indicate reason in comment box    Not completed     chronic issue.      Fall Risk    03/30/2023    9:26 AM 03/30/2023    6:52 AM 11/02/2022    8:16 AM 06/11/2022   12:05 PM 04/20/2022    9:15 AM  Fall Risk   Falls in the past year? 0 0 0 0 1  Comment    Denies new recent falls x 12 months; does not use assistive devices reports last fall Aug 2022- denies falls since then; does not use assistive devices  Number falls in past yr: 0  0 0 0  Injury with Fall? 0  0 0 1  Comment    N/A- no new recent falls x  12 months   Risk for fall due to : No Fall Risks   History of fall(s);Medication side effect;Orthopedic patient Orthopedic patient;History of fall(s);Medication side effect  Follow up Falls prevention discussed  Falls evaluation completed Falls prevention discussed Falls prevention discussed    FALL RISK PREVENTION PERTAINING TO THE HOME:  Any stairs in or around the home? No  If so, are there any without handrails? No  Home free of loose throw rugs in walkways, pet beds, electrical cords, etc? Yes  Adequate lighting in your home to reduce risk of falls? Yes   ASSISTIVE DEVICES UTILIZED TO PREVENT FALLS:  Life alert? No  Use of a cane, walker or w/c? No  Grab bars in the bathroom? Yes  Shower chair or bench in shower? No  Elevated toilet seat or a handicapped toilet? No   TIMED UP AND GO:  Was the test performed? No . Telephonic Visit  Cognitive Function:        03/30/2023    9:36 AM 03/10/2022   12:00 PM  6CIT Screen  What Year? 0 points 0 points  What month? 0 points 0 points  What time? 0 points 0 points  Count back from 20 0 points 0 points  Months in reverse 0 points 0 points  Repeat phrase 0 points 0 points  Total Score 0 points 0 points    Immunizations Immunization History  Administered Date(s) Administered   Fluad Quad(high Dose 65+) 06/27/2019, 07/16/2020, 07/10/2021   Influenza,inj,quad, With Preservative 07/28/2017, 08/01/2018, 08/02/2019   Influenza-Unspecified 08/01/2014, 07/31/2016, 07/27/2022   PFIZER(Purple Top)SARS-COV-2 Vaccination 12/31/2019, 01/21/2020, 07/31/2020, 02/09/2021   Pfizer Covid-19 Vaccine Bivalent Booster 60yrs & up 08/04/2021   Pneumococcal Conjugate-13 10/01/2014   Pneumococcal Polysaccharide-23 11/03/2012, 12/19/2017   Respiratory Syncytial Virus Vaccine,Recomb Aduvanted(Arexvy) 07/21/2022   Td 11/12/2009   Tdap 05/15/2020   Zoster Recombinat (Shingrix) 05/15/2020, 07/16/2020   Zoster, Live 12/14/2010    TDAP status: Up to  date  Flu Vaccine status: Due, Education has been provided regarding the importance of this vaccine. Advised may receive this vaccine at local pharmacy or Health Dept. Aware to provide a copy of the vaccination record if obtained from local pharmacy or Health Dept. Verbalized acceptance and understanding.  Due 06/2023  Pneumococcal vaccine status: Up to date  Covid-19 vaccine status: Completed vaccines  Qualifies for Shingles Vaccine? Yes   Zostavax completed Yes   Shingrix Completed?: Yes  Screening Tests Health Maintenance  Topic Date Due   Diabetic kidney evaluation - Urine ACR  01/02/2016   COVID-19 Vaccine (6 - 2023-24 season) 07/02/2022   FOOT EXAM  03/24/2023   OPHTHALMOLOGY EXAM  03/31/2023   HEMOGLOBIN A1C  05/27/2023   INFLUENZA VACCINE  06/02/2023   MAMMOGRAM  11/12/2023   Diabetic kidney evaluation - eGFR measurement  01/10/2024   Medicare Annual Wellness (AWV)  03/29/2024   Colonoscopy  08/31/2028   DTaP/Tdap/Td (3 - Td or Tdap) 05/15/2030   Pneumonia Vaccine 69+ Years old  Completed   DEXA SCAN  Completed   Hepatitis C Screening  Completed   Zoster Vaccines- Shingrix  Completed   HPV VACCINES  Aged Out    Health Maintenance  Health Maintenance Due  Topic Date Due   Diabetic kidney evaluation - Urine ACR  01/02/2016   COVID-19 Vaccine (6 - 2023-24 season) 07/02/2022   FOOT EXAM  03/24/2023    Colorectal cancer screening: Type of screening: Colonoscopy. Completed 08/31/2018. Repeat every 10 years  Mammogram status: Completed 11/11/2022. Repeat every year  Bone Density status: Completed 05/21/2020. Results reflect: Bone density results: OSTEOPENIA. Repeat every 2-3 years.  Lung Cancer Screening: (Low Dose CT Chest recommended if Age 71-80 years, 30 pack-year currently smoking OR have quit w/in 15years.) does not qualify.   Lung Cancer Screening Referral: no  Additional Screening:  Hepatitis C Screening: does qualify; Completed 07/16/2015  Vision  Screening: Recommended annual ophthalmology exams for early detection of glaucoma and other disorders of the eye. Is the patient up to date with their annual eye exam?  Yes  Who is the provider or what is the name of the office in which the patient attends annual eye exams? Maris Berger, MD. If pt is not established with a provider, would they like to be referred to a provider to establish care? No .   Dental Screening: Recommended annual dental exams for proper oral hygiene  Community Resource Referral / Chronic Care Management: CRR required this visit?  No   CCM required this visit?  No      Plan:     I have personally reviewed and noted the following in the patient's chart:   Medical and social history Use of alcohol, tobacco or illicit drugs  Current medications and supplements including opioid prescriptions. Patient is not currently taking opioid prescriptions. Functional ability and status Nutritional status Physical activity Advanced directives List of other physicians Hospitalizations, surgeries, and ER visits in previous 12 months Vitals Screenings to include cognitive, depression, and falls Referrals and appointments  In addition, I have reviewed and discussed with patient certain preventive protocols, quality metrics, and best practice recommendations. A written personalized care plan for preventive services as well as general preventive health recommendations were provided to patient.     Mickeal Needy, LPN   8/75/[id number]   Nurse Notes: Normal cognitive status assessed by direct observation via telephone conversation by this Nurse Health Advisor. No abnormalities found.

## 2023-03-30 NOTE — Patient Instructions (Addendum)
Brenda [name] , Thank you for taking time to come for your Medicare Wellness Visit. I appreciate your ongoing commitment to your health goals. Please review the following plan we discussed and let me know if I can assist you in the future.   These are the goals we discussed:  Goals      My goal for 2024 is to stay alive and well.     Patient Self-Care Activities     Timeframe:  Long-Range Goal Priority:  High Start Date:          01/28/22                   Expected End Date:       01/29/2024                Patient Brenda [name] will: Take medications as prescribed Attend all scheduled provider appointments Call pharmacy for medicatiron refills Call provider office for new concens or questions Continue to check fasting (first thing in the morning, before eating) and later in day blood sugars at home as you have been Continue to check blood pressures at home as you have been Continue to maintain close communication with your cardiologist since you are currently experiencing episodes of A-Fib Continue to follow heart healthy, low salt, low cholesterol, carbohydrate-modified, low sugar diet Keep up the  great work at preventing recent falls- take your time, don't rush; don't over-do with activity, pace yourself        This is a list of the screening recommended for you and due dates:  Health Maintenance  Topic Date Due   Yearly kidney health urinalysis for diabetes  01/02/2016   COVID-19 Vaccine (6 - 2023-24 season) 07/02/2022   Complete foot exam   03/24/2023   Eye exam for diabetics  03/31/2023   Hemoglobin A1C  05/27/2023   Flu Shot  06/02/2023   Mammogram  11/12/2023   Yearly kidney function blood test for diabetes  01/10/2024   Medicare Annual Wellness Visit  03/29/2024   Colon Cancer Screening  08/31/2028   DTaP/Tdap/Td vaccine (3 - Td or Tdap) 05/15/2030   Pneumonia Vaccine  Completed   DEXA scan (bone density measurement)  Completed   Hepatitis C Screening  Completed   Zoster  (Shingles) Vaccine  Completed   HPV Vaccine  Aged Out    Advanced directives: Yes  Conditions/risks identified: Yes; Type II Diabetes  Next appointment: Follow up in one year for your annual wellness visit.   Preventive Care 15 Years and Older, Female Preventive care refers to lifestyle choices and visits with your health care provider that can promote health and wellness. What does preventive care include? A yearly physical exam. This is also called an annual well check. Dental exams once or twice a year. Routine eye exams. Ask your health care provider how often you should have your eyes checked. Personal lifestyle choices, including: Daily care of your teeth and gums. Regular physical activity. Eating a healthy diet. Avoiding tobacco and drug use. Limiting alcohol use. Practicing safe sex. Taking low-dose aspirin every day. Taking vitamin and mineral supplements as recommended by your health care provider. What happens during an annual well check? The services and screenings done by your health care provider during your annual well check will depend on your age, overall health, lifestyle risk factors, and family history of disease. Counseling  Your health care provider may ask you questions about your: Alcohol use. Tobacco use. Drug use. Emotional well-being. Home  and relationship well-being. Sexual activity. Eating habits. History of falls. Memory and ability to understand (cognition). Work and work Astronomer. Reproductive health. Screening  You may have the following tests or measurements: Height, weight, and BMI. Blood pressure. Lipid and cholesterol levels. These may be checked every 5 years, or more frequently if you are over 77 years old. Skin check. Lung cancer screening. You may have this screening every year starting at age 89 if you have a 30-pack-year history of smoking and currently smoke or have quit within the past 15 years. Fecal occult blood test  (FOBT) of the stool. You may have this test every year starting at age 62. Flexible sigmoidoscopy or colonoscopy. You may have a sigmoidoscopy every 5 years or a colonoscopy every 10 years starting at age 57. Hepatitis C blood test. Hepatitis B blood test. Sexually transmitted disease (STD) testing. Diabetes screening. This is done by checking your blood sugar (glucose) after you have not eaten for a while (fasting). You may have this done every 1-3 years. Bone density scan. This is done to screen for osteoporosis. You may have this done starting at age 63. Mammogram. This may be done every 1-2 years. Talk to your health care provider about how often you should have regular mammograms. Talk with your health care provider about your test results, treatment options, and if necessary, the need for more tests. Vaccines  Your health care provider may recommend certain vaccines, such as: Influenza vaccine. This is recommended every year. Tetanus, diphtheria, and acellular pertussis (Tdap, Td) vaccine. You may need a Td booster every 10 years. Zoster vaccine. You may need this after age 34. Pneumococcal 13-valent conjugate (PCV13) vaccine. One dose is recommended after age 51. Pneumococcal polysaccharide (PPSV23) vaccine. One dose is recommended after age 41. Talk to your health care provider about which screenings and vaccines you need and how often you need them. This information is not intended to replace advice given to you by your health care provider. Make sure you discuss any questions you have with your health care provider. Document Released: 11/14/2015 Document Revised: 07/07/2016 Document Reviewed: 08/19/2015 Elsevier Interactive Patient Education  2017 [employer].  Fall Prevention in the Home Falls can cause injuries. They can happen to people of all ages. There are many things you can do to make your home safe and to help prevent falls. What can I do on the outside of my  home? Regularly fix the edges of walkways and driveways and fix any cracks. Remove anything that might make you trip as you walk through a door, such as a raised step or threshold. Trim any bushes or trees on the path to your home. Use bright outdoor lighting. Clear any walking paths of anything that might make someone trip, such as rocks or tools. Regularly check to see if handrails are loose or broken. Make sure that both sides of any steps have handrails. Any raised decks and porches should have guardrails on the edges. Have any leaves, snow, or ice cleared regularly. Use sand or salt on walking paths during winter. Clean up any spills in your garage right away. This includes oil or grease spills. What can I do in the bathroom? Use night lights. Install grab bars by the toilet and in the tub and shower. Do not use towel bars as grab bars. Use non-skid mats or decals in the tub or shower. If you need to sit down in the shower, use a plastic, non-slip stool. Keep the floor  dry. Clean up any water that spills on the floor as soon as it happens. Remove soap buildup in the tub or shower regularly. Attach bath mats securely with double-sided non-slip rug tape. Do not have throw rugs and other things on the floor that can make you trip. What can I do in the bedroom? Use night lights. Make sure that you have a light by your bed that is easy to reach. Do not use any sheets or blankets that are too big for your bed. They should not hang down onto the floor. Have a firm chair that has side arms. You can use this for support while you get dressed. Do not have throw rugs and other things on the floor that can make you trip. What can I do in the kitchen? Clean up any spills right away. Avoid walking on wet floors. Keep items that you use a lot in easy-to-reach places. If you need to reach something above you, use a strong step stool that has a grab bar. Keep electrical cords out of the way. Do  not use floor polish or wax that makes floors slippery. If you must use wax, use non-skid floor wax. Do not have throw rugs and other things on the floor that can make you trip. What can I do with my stairs? Do not leave any items on the stairs. Make sure that there are handrails on both sides of the stairs and use them. Fix handrails that are broken or loose. Make sure that handrails are as long as the stairways. Check any carpeting to make sure that it is firmly attached to the stairs. Fix any carpet that is loose or worn. Avoid having throw rugs at the top or bottom of the stairs. If you do have throw rugs, attach them to the floor with carpet tape. Make sure that you have a light switch at the top of the stairs and the bottom of the stairs. If you do not have them, ask someone to add them for you. What else can I do to help prevent falls? Wear shoes that: Do not have high heels. Have rubber bottoms. Are comfortable and fit you well. Are closed at the toe. Do not wear sandals. If you use a stepladder: Make sure that it is fully opened. Do not climb a closed stepladder. Make sure that both sides of the stepladder are locked into place. Ask someone to hold it for you, if possible. Clearly mark and make sure that you can see: Any grab bars or handrails. First and last steps. Where the edge of each step is. Use tools that help you move around (mobility aids) if they are needed. These include: Canes. Walkers. Scooters. Crutches. Turn on the lights when you go into a dark area. Replace any light bulbs as soon as they burn out. Set up your furniture so you have a clear path. Avoid moving your furniture around. If any of your floors are uneven, fix them. If there are any pets around you, be aware of where they are. Review your medicines with your doctor. Some medicines can make you feel dizzy. This can increase your chance of falling. Ask your doctor what other things that you can do to  help prevent falls. This information is not intended to replace advice given to you by your health care provider. Make sure you discuss any questions you have with your health care provider. Document Released: 08/14/2009 Document Revised: 03/25/2016 Document Reviewed: 11/22/2014 Elsevier Interactive Patient  Education  2017 [employer].

## 2023-04-12 ENCOUNTER — Ambulatory Visit: Payer: Medicare Other | Admitting: Cardiovascular Disease

## 2023-04-12 DIAGNOSIS — Z961 Presence of intraocular lens: Secondary | ICD-10-CM | POA: Diagnosis not present

## 2023-04-12 DIAGNOSIS — E119 Type 2 diabetes mellitus without complications: Secondary | ICD-10-CM | POA: Diagnosis not present

## 2023-04-12 LAB — HM DIABETES EYE EXAM

## 2023-04-15 ENCOUNTER — Ambulatory Visit: Payer: Medicare Other | Admitting: Cardiovascular Disease

## 2023-04-17 ENCOUNTER — Encounter: Payer: Self-pay | Admitting: Internal Medicine

## 2023-04-18 MED ORDER — ALPRAZOLAM 1 MG PO TABS: 1.0000 mg | ORAL_TABLET | Freq: Two times a day (BID) | ORAL | 5 refills | Status: DC | PRN

## 2023-04-19 ENCOUNTER — Ambulatory Visit: Payer: Medicare Other | Attending: Cardiovascular Disease | Admitting: Cardiovascular Disease

## 2023-04-19 ENCOUNTER — Encounter: Payer: Self-pay | Admitting: Cardiovascular Disease

## 2023-04-19 VITALS — BP 128/70 | HR 83 | Ht 61.0 in | Wt 178.4 lb

## 2023-04-19 DIAGNOSIS — I4819 Other persistent atrial fibrillation: Secondary | ICD-10-CM | POA: Diagnosis not present

## 2023-04-19 DIAGNOSIS — I455 Other specified heart block: Secondary | ICD-10-CM | POA: Diagnosis not present

## 2023-04-19 NOTE — Patient Instructions (Signed)
Medication Instructions:  Your physician recommends that you continue on your current medications as directed. Please refer to the Current Medication list given to you today. *If you need a refill on your cardiac medications before your next appointment, please call your pharmacy*   Follow-Up: At [facility], you and your health needs are our priority.  As part of our continuing mission to provide you with exceptional heart care, we have created designated Provider Care Teams.  These Care Teams include your primary Cardiologist (physician) and Advanced Practice Providers (APPs -  Physician Assistants and Nurse Practitioners) who all work together to provide you with the care you need, when you need it.  We recommend signing up for the patient portal called "MyChart".  Sign up information is provided on this After Visit Summary.  MyChart is used to connect with patients for Virtual Visits (Telemedicine).  Patients are able to view lab/test results, encounter notes, upcoming appointments, etc.  Non-urgent messages can be sent to your provider as well.   To learn more about what you can do with MyChart, go to [email or web address].    Your next appointment:   1 year(s)  Provider:   Augustus Mealor, MD  

## 2023-04-19 NOTE — Progress Notes (Signed)
  Cardiology Office Note:    Date:  04/19/2023   ID:  Brenda, [name] Jun 23, 1951, MRN [id number]  PCP:  Myrlene Broker, MD   [facility] Providers Cardiologist:  Julien Nordmann, MD Electrophysiologist:  Maurice Small, MD     Referring MD: Myrlene Broker, *    History of Present Illness:    Brenda [name] is a 72 y.o. female with a hx listed below pertinent for CAD, DM, HTN, persistent AF, CKD IIIb-IV referred for arrhythmia management.  She was diagnosed with atrial fibrillation in May 2022 and placed on metoprolol and eliquis.  She was started on amiodarone in June 2023.  In September 2023, she complained of lightheadedness and dizziness.  A Zio patch was placed that showed sinus pauses up to about 8 seconds, so her Coreg and amiodarone were discontinued.  Additionally, her TSH was elevated at 11.7.  Pacemaker placement was deferred with a reversible cause.  Monitor placed and follow-up did not show any atrial fibrillation or significant bradycardia.  She has had sinus bradycardia with metoprolol years ago.  She reports that she has been doing very well since wearing the monitor.  She records her heart rate 4 times a day, and these have been running between 70 and 90 bpm.  She notes that when she did have atrial fibrillation, her primary symptom was fatigue.  She did not have significant palpitations.  She could appreciate a regular rhythm during pulse checks.  She is a retired Engineer, civil (consulting).  She underwent atrial fibrillation ablation on January 13, 2022.  She reports that since the ablation she has not had any symptoms of atrial fibrillation.       EKGs/Labs/Other Studies Reviewed:     EKG:  Last EKG results: See Epic   Recent Labs: 10/14/2022: Magnesium 2.0 12/17/2022: ALT 10 12/28/2022: TSH 4.99 01/10/2023: BUN 29; Creatinine, Ser 1.58; Potassium 4.6; Sodium 137 01/14/2023: Hemoglobin 11.4; Platelets 119       Physical Exam:    VS:  BP  128/70   Pulse 83   Ht 5\' 1"  (1.549 m)   Wt 178 lb 6.4 oz (80.9 kg)   SpO2 96%   BMI 33.71 kg/m     Wt Readings from Last 3 Encounters:  04/19/23 178 lb 6.4 oz (80.9 kg)  03/30/23 175 lb (79.4 kg)  02/28/23 178 lb 4 oz (80.9 kg)     GEN:  Well nourished, well developed in no acute distress CARDIAC: RRR, no murmurs, rubs, gallops RESPIRATORY:  Normal work of breathing MUSCULOSKELETAL: no edema    ASSESSMENT & PLAN:    Paroxysmal atrial fibrillation:  she notes fatigue when in AF.  not a candidate for most AADs due to renal dysfunction.  Maintaining sinus rhythm since ablation  Secondary hypercoagulable state: Continue apixaban 5mg  PO BID  Obesity: I reinforced her weight loss efforts.  DM II  CKD IV: precludes use of most AADs.          Medication Adjustments/Labs and Tests Ordered: Current medicines are reviewed at length with the patient today.  Concerns regarding medicines are outlined above.  Orders Placed This Encounter  Procedures   EKG 12-Lead   No orders of the defined types were placed in this encounter.    Signed, Maurice Small, MD  04/19/2023 12:09 PM    [facility]

## 2023-04-25 ENCOUNTER — Ambulatory Visit (INDEPENDENT_AMBULATORY_CARE_PROVIDER_SITE_OTHER): Payer: Medicare Other | Admitting: Internal Medicine

## 2023-04-25 ENCOUNTER — Encounter: Payer: Self-pay | Admitting: Internal Medicine

## 2023-04-25 VITALS — BP 114/62 | HR 85 | Ht 61.0 in | Wt 178.8 lb

## 2023-04-25 DIAGNOSIS — E1169 Type 2 diabetes mellitus with other specified complication: Secondary | ICD-10-CM | POA: Diagnosis not present

## 2023-04-25 DIAGNOSIS — E785 Hyperlipidemia, unspecified: Secondary | ICD-10-CM | POA: Diagnosis not present

## 2023-04-25 DIAGNOSIS — Z794 Long term (current) use of insulin: Secondary | ICD-10-CM | POA: Diagnosis not present

## 2023-04-25 DIAGNOSIS — E032 Hypothyroidism due to medicaments and other exogenous substances: Secondary | ICD-10-CM | POA: Diagnosis not present

## 2023-04-25 DIAGNOSIS — Z6835 Body mass index (BMI) 35.0-35.9, adult: Secondary | ICD-10-CM

## 2023-04-25 DIAGNOSIS — Z7984 Long term (current) use of oral hypoglycemic drugs: Secondary | ICD-10-CM | POA: Diagnosis not present

## 2023-04-25 DIAGNOSIS — E1165 Type 2 diabetes mellitus with hyperglycemia: Secondary | ICD-10-CM

## 2023-04-25 DIAGNOSIS — E119 Type 2 diabetes mellitus without complications: Secondary | ICD-10-CM

## 2023-04-25 LAB — POCT GLYCOSYLATED HEMOGLOBIN (HGB A1C): Hemoglobin A1C: 6.4 % — AB (ref 4.0–5.6)

## 2023-04-25 LAB — TSH: TSH: 4.67 u[IU]/mL (ref 0.35–5.50)

## 2023-04-25 LAB — T4, FREE: Free T4: 1 ng/dL (ref 0.60–1.60)

## 2023-04-25 MED ORDER — LANTUS SOLOSTAR 100 UNIT/ML ~~LOC~~ SOPN: PEN_INJECTOR | SUBCUTANEOUS | 3 refills | Status: DC

## 2023-04-25 NOTE — Progress Notes (Addendum)
Patient ID: Brenda [name], female   DOB: [birth year], 72 y.o.   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 Last visit 5 months ago.  Interim history: + increased urination, but no blurry vision, nausea, chest pain.   She has lifelong insomnia.  In the past, she was waking up at 3 AM to go to work at 5:30 AM. She was in the emergency room with A-fib with RVR 10/14/2022. She had a cardioversion. She had a cardiac ablation 12/2022.  Amiodarone and will not restart it.    DM2: Reviewed HbA1c levels: Lab Results  Component Value Date   HGBA1C 6.1 (A) 11/26/2022   HGBA1C 6.3 (A) 07/27/2022   HGBA1C 6.9 (A) 03/23/2022   Patient is on: - Lantus 12 units in a.m.- started 05/2019 by PCP >> 24 >> 18-20 >> 16-18 units after dinner - Jardiance 10 before breakfast-added 11/2020 She was on Glipizide in 11/2015 after a steroid inj. We stopped Victoza due to nausea. Metformin ER was stopped by PCP due to CKD. Glipizide ER was stopped 06/2019 due to low blood sugars. She was previously on glipizide XL but stopped 05/2022.  Pt checks her sugars twice a day: - am: 85, 94-140, 160 >> 70-100, 113 >> 78-117, 135, 145 >> 74-130, 142 - 2h after b'fast: 156 >> n/c >> 148 >> n/c >> 134 >> n/c - before lunch: 71-142, 160 >> 63, 68-114 >> 65-110, 125, 140 >> 78-127, 132 - 2h after lunch: 118, 123 >> 92, 99 >> n/c >> 145 >> n/c  - before dinner: 80, 99-157, 160, 170 >> 73-135, 150 >> 68-132, 145 >> 71, 74-140, 160 - 2h after dinner:n/c >> 138, 148 >> n/c >> 133 - bedtime:  130-140 >> 137, 142 >> n/c >> 93-115 >> n/c >> 108 - nighttime: n/c Lowest sugar was 37 x1 >> ... 58 >> 63 x1 >> 60 >> 63 >> 65 >> 71; she has hypoglycemia awareness in the 70s. Highest sugar was 200 >> 160 >> 164 >> 188 >> 180  (wedding) >> 145 >> 170.  Glucometer: Freestyle Lite  Pt's meals are: - Breakfast: English muffin + preserve or cereal or yoghurt - snack: fruit or fruit + yoghurt - Lunch: 1/2 sandwich + chips + salsa + fruit/sugar free >> protein bar - Dinner: soup or chicken/steak + veggies, occasional starch or Lean cuisine or light salad No soft drinks.  -+ CKD - sees Dr. Signe Colt, last BUN/creatinine:  01/19/2023: 26/1.59, GFR 34, ACR 9, glucose 149 Lab Results  Component Value Date   BUN 29 (H) 01/10/2023   CREATININE 1.58 (H) 01/10/2023  On benazepril.  Sees nephrology. She had a high potassium (5.9) >> used Kayexalate x3 >> normalized.  She is on a low potassium diet.  -+ HL last set of lipids: Lab Results  Component Value Date   CHOL 104 07/31/2022   HDL 21 (L) 07/31/2022   LDLCALC 44 07/31/2022   LDLDIRECT 55.0 05/15/2020   TRIG 193 (H) 07/31/2022   CHOLHDL 5.0 07/31/2022  On Zocor and Zetia.  - last eye exam 04/2023: No DR; she had cataract surgery in 2016.  She has a history of retinal tear.  - no numbness and tingling in her feet.  She takes Neurontin for leg cramps at night.  Last foot exam 03/2022.  Acquired hypothyroidism: -Likely  related to amiodarone treatment >> now off definitively  Reviewed her TFTs: Lab Results  Component Value Date   TSH 4.99 12/28/2022   TSH 8.742 (H) 11/17/2022   TSH 22.801 (H) 09/27/2022   TSH 11.721 (H) 07/30/2022   TSH 2.453 04/19/2022   TSH 2.640 04/14/2021   TSH 1.643 11/19/2017   TSH 2.67 03/07/2017   Lab Results  Component Value Date   FREET4 0.77 12/28/2022   FREET4 0.77 11/17/2022   FREET4 0.55 (L) 09/27/2022   T3FREE 3.0 11/17/2022   She was started on levothyroxine 25 mcg daily - started 09/2022.  She takes this: - in am - fasting - at least 30 min from b'fast - no calcium - + iron 1-1.5h later >> evening - no multivitamins - + PPIs (Nexium) 1-1.5 hours later >> evening - not on Biotin  She also has atrial  fibrillation, HTN, GERD, depression. She was admitted 11/2017 for dehydration, anemia, and acute renal failure >> diagnosed with CLL.  ROS: + see HPI  I reviewed pt's medications, allergies, PMH, social hx, family hx, and changes were documented in the history of present illness. Otherwise, unchanged from my initial visit note.  Past Medical History:  Diagnosis Date   Anxiety    Arthritis    Atrial fibrillation (HCC)    CLL (chronic lymphocytic leukemia) (HCC)    Dx. 2019 Dr. 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 Sebastian Ache first visit 11-2019   Past Surgical History:  Procedure Laterality Date   ABDOMINAL HYSTERECTOMY  1998   ATRIAL FIBRILLATION ABLATION N/A 01/14/2023   Procedure: ATRIAL FIBRILLATION ABLATION;  Surgeon: Maurice Small, MD;  Location: [facility];  Service: Cardiovascular;  Laterality: N/A;   COLONOSCOPY     EYE SURGERY Bilateral    cataracts   EYE SURGERY Left    retina tear   FEMUR IM NAIL  10/25/2012   Procedure: INTRAMEDULLARY (IM) NAIL FEMORAL;  Surgeon: Shelda Pal, MD;  Location: [facility];  Service: Orthopedics;  Laterality: Left;   HIP SURGERY     Fracture L IM nail and 3 rods   left knee  arthroscopy  12/2010   LYMPH NODE BIOPSY     axillary left 12-2018   REFRACTIVE SURGERY     skin cancer removed right and and lower forearm     squamoun in situ   TOTAL KNEE ARTHROPLASTY  12/20/2011   Procedure: TOTAL KNEE ARTHROPLASTY;  Surgeon: Loanne Drilling, MD;  Location: [facility];  Service: Orthopedics;  Laterality:  Left;  Failed attempt at spinal    TOTAL SHOULDER ARTHROPLASTY Right 08/19/2016   Procedure: RIGHT TOTAL SHOULDER ARTHROPLASTY;  Surgeon: Francena Hanly, MD;  Location: [facility] OR;  Service: Orthopedics;  Laterality: Right;   TOTAL SHOULDER ARTHROPLASTY Left 01/10/2020   Procedure: TOTAL SHOULDER ARTHROPLASTY;  Surgeon: Francena Hanly, MD;  Location: [facility];  Service: Orthopedics;  Laterality: Left;    Social History   Social History   Marital Status: Single    Spouse Name: N/A   Number of Children: 0   Occupational History   Charity fundraiser at [employer]    Social History Main Topics   Smoking status: Never Smoker    Smokeless tobacco: Never Used   Alcohol Use: No   Drug Use: No   Social History Designer, 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      ALPRAZolam (XANAX) 1 MG tablet Take 1 tablet (1 mg total) by mouth 2 (two) times daily as needed for anxiety. 60 tablet 5   amoxicillin (AMOXIL) 500 MG capsule Take 2,000 mg by mouth See admin instructions. For dental appointment     apixaban (ELIQUIS) 5 MG TABS tablet Take 1 tablet (5 mg total) by mouth 2 (two) times daily. 60 tablet 5   BD PEN NEEDLE NANO 2ND GEN 32G X 4 MM MISC USE DAILY AS DIRECTED 100 each 3   Blood Glucose Monitoring Suppl (ONETOUCH VERIO) w/Device KIT Use as advised 1 kit 0   buPROPion (WELLBUTRIN XL) 300 MG 24 hr tablet TAKE 1 TABLET(300 MG) BY MOUTH DAILY 90 tablet 3   Cholecalciferol (VITAMIN D3)  2000 units TABS Take 2,000 mcg by mouth daily.     Cyanocobalamin (B-12) 1000 MCG SUBL Place 1,000 mcg under the tongue every evening.      doxazosin (CARDURA) 2 MG tablet TAKE 1 TABLET(2 MG) BY MOUTH EVERY EVENING 90 tablet 1   esomeprazole (NEXIUM) 20 MG capsule Take 20 mg by mouth every Monday, Wednesday, and Friday.     ezetimibe (ZETIA) 10 MG tablet TAKE 1 TABLET(10 MG) BY MOUTH DAILY 90 tablet 1   glucose blood (ONETOUCH VERIO) test strip USE TO TEST BLOOD GLUCOSE THREE TIMES DAILY. 300 strip 1   insulin glargine (LANTUS SOLOSTAR) 100 UNIT/ML Solostar Pen 14-16 units after dinner 30 mL 3   iron polysaccharides (NIFEREX) 150 MG capsule Take 150 mg by mouth See admin instructions. Tue Thu and Sat     JARDIANCE 10 MG TABS tablet TAKE 1 TABLET(10 MG) BY MOUTH DAILY BEFORE BREAKFAST 30 tablet 11   Lancet Devices (LANCING DEVICE) MISC Use as advised - Freestyle 1 each 0   Lancets (ONETOUCH DELICA PLUS LANCET30G) MISC TEST BLOOD SUGAR THREE TIMES DAILY AS DIRECTED 300 each 1   levothyroxine (SYNTHROID) 25 MCG tablet Take 1 tablet (25 mcg total) by mouth daily before breakfast. 30 tablet 5   lisinopril (ZESTRIL) 20 MG tablet Take 1 tablet (20 mg total) by mouth 2 (two) times daily. 180 tablet 3   NONFORMULARY OR COMPOUNDED ITEM Apply 1 application topically 2 (two) times daily as needed (sun damage on face). FLUOROURACIL 5% + CALCIPOTRIENE 0.005%     simvastatin (ZOCOR) 40 MG tablet TAKE 1 TABLET(40 MG) BY MOUTH AT BEDTIME 90 tablet 2  triamcinolone cream (KENALOG) 0.1 % Apply 1 application topically 2 (two) times daily as needed (skin rash.).      No current facility-administered medications on file prior to visit.   Allergies  Allergen Reactions   Carvedilol Other (See Comments)    Sinus Pauses   Gadolinium Derivatives Other (See Comments)   Nsaids Other (See Comments)    Due to kidney function   Amiodarone Other (See Comments)    Causes bradycardia and decreased thyroid function    Amlodipine     Swelling-  leg swell    Beta Adrenergic Blockers     REACTION: Decreased heart rate   Contrast Media [Iodinated Contrast Media] Other (See Comments)    Patient states unable to take / use due to kidney function, NOT allergy!   Gabapentin Other (See Comments)    confusion   Family History  Problem Relation Age of Onset   Cancer Mother        breast cancer   Cancer Father        plasma sarcoma   Breast cancer Neg Hx    PE: BP 114/62   Pulse 85   Ht 5\' 1"  (1.549 m)   Wt 178 lb 12.8 oz (81.1 kg)   SpO2 97%   BMI 33.78 kg/m   Wt Readings from Last 3 Encounters:  04/25/23 178 lb 12.8 oz (81.1 kg)  04/19/23 178 lb 6.4 oz (80.9 kg)  03/30/23 175 lb (79.4 kg)   Constitutional: overweight, in NAD Eyes: EOMI, no exophthalmos ENT: no thyromegaly, no cervical lymphadenopathy Cardiovascular: RRR, No MRG Respiratory: CTA B Musculoskeletal: no deformities Skin: no rashes Neurological: no tremor with outstretched hands Diabetic Foot Exam - Simple   Simple Foot Form Diabetic Foot exam was performed with the following findings: Yes 04/25/2023  8:21 AM  Visual Inspection See comments: Yes Sensation Testing Intact to touch and monofilament testing bilaterally: Yes Pulse Check See comments: Yes Comments Dorsalis pedis pulses difficult to palpate bilaterally. R 2nd toe turned inward after a reported fracture.    Assessment: 1. DM2, insulin-dependent, uncontrolled, with complications  - mild CKD  2. Obesity class 2  3. HL  4.  Amiodarone-induced hypothyroidism  PLAN:  1. Patient with longstanding, uncontrolled, type 2 diabetes, with much improved control after adding insulin in summer 2020.  At that time, metformin was stopped due to worsening kidney function. She was on Victoza but could not tolerate it due to nausea.  She was reticent to retry a GLP-1 receptor agonist afterwards.  At last visit, HbA1c was 6.1%, excellent so we reduced her Lantus dose more.   However, since then, she had to increase the Lantus back to 18 units daily due to higher blood sugars. -At today's visit, we reviewed together her excellent blood sugar log.  She is checking sugars 3 times a day and they are mostly at goal with very few mild hyperglycemic excursions.  Lowest blood sugars are in the 70s.  We can continue the current regimen for now. - I suggested to:  Patient Instructions  Please continue: - Jardiance 10 mg before b'fast  - Lantus 16-20 units after dinner  Please continue  Levothyroxine 25 mcg daily.  Take the thyroid hormone every day, with water, at least 30 minutes before breakfast, separated by at least 4 hours from: - acid reflux medications - calcium - iron - multivitamins  Please stop at the lab.  Please return in 4 months with your sugar log.  - we checked  her HbA1c: 6.4% (slightly higher) - advised to check sugars at different times of the day - 3x a day, rotating check times - advised for yearly eye exams >> she is UTD - return to clinic in 4 months  2. Obesity class 2 -She has problems eating the right foods, since she also has to limit potassium -Will continue Jardiance which can also help with weight loss -She was reticent to retry GLP-1 receptor agonist due to previous side effects and cost -we previously discussed that she continued to gain weight and unfortunately this was putting more strain on her back.  She cannot exercise due to the pain and was thinking about physical therapy.  I strongly advised her to do so.  She mentioned that she was getting severe back pain with standing for 2 to 3 hours.  I advised her to try to exercise for shorter periods of time and also to do some chair exercises -She lost 8 pounds before last visit, previously lost 7 pounds after stopping metoprolol and starting exercise -she lost a net 3 pounds since last visit  3.  Hyperlipidemia -Reviewed latest lipid panel from 07/2022: LDL at goal, triglycerides  high, HDL low: Lab Results  Component Value Date   CHOL 104 07/31/2022   HDL 21 (L) 07/31/2022   LDLCALC 44 07/31/2022   LDLDIRECT 55.0 05/15/2020   TRIG 193 (H) 07/31/2022   CHOLHDL 5.0 07/31/2022  -She continues Zocor 40 mg daily and Zetia 10 mg daily without side effects  4.  Amiodarone-induced hypothyroidism -Due to amiodarone, likely, now off the medication - latest thyroid labs reviewed with pt. >> normal: Lab Results  Component Value Date   TSH 4.99 12/28/2022  - she continues on LT4 25 mcg daily, started 10/2022 - pt feels good on this dose. - we discussed about taking the thyroid hormone every day, with water, >30 minutes before breakfast, separated by >4 hours from acid reflux medications, calcium, iron, multivitamins. Pt. is taking it correctly now.  At last visit, she was taking iron and PPIs 1-1.5 hours after LT4 and I advised her to move these at least 4 hours later - will check thyroid tests today: TSH and fT4; I am hoping that now that she is off amiodarone we can possibly stoplevothyroxine - If labs are abnormal, she will need to return for repeat TFTs in 1.5 months  Needs refills LT4 for 3 mo.  Component     Latest Ref Rng 04/25/2023  TSH     0.35 - 5.50 uIU/mL 4.67   T4,Free(Direct)     0.60 - 1.60 ng/dL 8.29   TSH is normal, slightly improved from before.  We could try to stop LT4 and recheck her thyroid test in 2 months to see if we can continue without the medication.  Carlus Pavlov, MD PhD [facility]

## 2023-04-25 NOTE — Patient Instructions (Addendum)
Please continue: - Jardiance 10 mg before b'fast  - Lantus 16-20 units after dinner  Please continue  Levothyroxine 25 mcg daily.  Take the thyroid hormone every day, with water, at least 30 minutes before breakfast, separated by at least 4 hours from: - acid reflux medications - calcium - iron - multivitamins  Please stop at the lab.  Please return in 4 months with your sugar log.

## 2023-05-16 ENCOUNTER — Encounter: Payer: Self-pay | Admitting: Internal Medicine

## 2023-05-16 ENCOUNTER — Ambulatory Visit (INDEPENDENT_AMBULATORY_CARE_PROVIDER_SITE_OTHER): Payer: Medicare Other | Admitting: Internal Medicine

## 2023-05-16 VITALS — BP 120/64 | HR 88 | Temp 98.1°F | Ht 61.0 in | Wt 178.0 lb

## 2023-05-16 DIAGNOSIS — I700 Atherosclerosis of aorta: Secondary | ICD-10-CM

## 2023-05-16 DIAGNOSIS — C9110 Chronic lymphocytic leukemia of B-cell type not having achieved remission: Secondary | ICD-10-CM

## 2023-05-16 DIAGNOSIS — F3340 Major depressive disorder, recurrent, in remission, unspecified: Secondary | ICD-10-CM

## 2023-05-16 NOTE — Progress Notes (Signed)
   Subjective:   Patient ID: Brenda [name], female    DOB: 1951-01-02, 72 y.o.   MRN: [id number]  HPI The patient is a 72 YO female coming in for follow up.   Review of Systems  Constitutional: Negative.   HENT: Negative.    Eyes: Negative.   Respiratory:  Negative for cough, chest tightness and shortness of breath.   Cardiovascular:  Negative for chest pain, palpitations and leg swelling.  Gastrointestinal:  Negative for abdominal distention, abdominal pain, constipation, diarrhea, nausea and vomiting.  Musculoskeletal: Negative.   Skin: Negative.   Neurological: Negative.   Psychiatric/Behavioral: Negative.      Objective:  Physical Exam Constitutional:      Appearance: She is well-developed.  HENT:     Head: Normocephalic and atraumatic.  Cardiovascular:     Rate and Rhythm: Normal rate and regular rhythm.  Pulmonary:     Effort: Pulmonary effort is normal. No respiratory distress.     Breath sounds: Normal breath sounds. No wheezing or rales.  Abdominal:     General: Bowel sounds are normal. There is no distension.     Palpations: Abdomen is soft.     Tenderness: There is no abdominal tenderness. There is no rebound.  Musculoskeletal:     Cervical back: Normal range of motion.  Skin:    General: Skin is warm and dry.  Neurological:     Mental Status: She is alert and oriented to person, place, and time.     Coordination: Coordination normal.     Vitals:   05/16/23 0813  BP: 120/64  Pulse: 88  Temp: 98.1 F (36.7 C)  TempSrc: Oral  SpO2: 95%  Weight: 178 lb (80.7 kg)  Height: 5\' 1"  (1.549 m)    Assessment & Plan:  Visit time 15 minutes in face to face communication with patient and coordination of care, additional 5 minutes spent in record review, coordination or care, ordering tests, communicating/referring to other healthcare professionals, documenting in medical records all on the same day of the visit for total time 20 minutes spent on the visit.

## 2023-05-20 DIAGNOSIS — F3340 Major depressive disorder, recurrent, in remission, unspecified: Secondary | ICD-10-CM | POA: Insufficient documentation

## 2023-05-20 DIAGNOSIS — I700 Atherosclerosis of aorta: Secondary | ICD-10-CM | POA: Insufficient documentation

## 2023-05-20 NOTE — Assessment & Plan Note (Signed)
She is taking wellbutrin 300 mg daily and some worsening symptoms due to chronic health conditions which are new in the last 1-2 years. Declines medication change today.

## 2023-05-20 NOTE — Assessment & Plan Note (Signed)
Taking zetia and simvastatin 40 mg daily. Continue.

## 2023-05-20 NOTE — Assessment & Plan Note (Signed)
We discussed how this can change with stress and her numbers were increasing slightly at last check. Will continue to monitor.

## 2023-05-29 ENCOUNTER — Other Ambulatory Visit: Payer: Self-pay | Admitting: Physician Assistant

## 2023-06-01 ENCOUNTER — Ambulatory Visit (HOSPITAL_COMMUNITY)
Admission: RE | Admit: 2023-06-01 | Discharge: 2023-06-01 | Disposition: A | Discharge status: Home / Self Care | Payer: Medicare Other | Source: Ambulatory Visit | Attending: Hematology | Admitting: Hematology

## 2023-06-01 DIAGNOSIS — I700 Atherosclerosis of aorta: Secondary | ICD-10-CM | POA: Diagnosis not present

## 2023-06-01 DIAGNOSIS — C9110 Chronic lymphocytic leukemia of B-cell type not having achieved remission: Secondary | ICD-10-CM | POA: Insufficient documentation

## 2023-06-01 DIAGNOSIS — R591 Generalized enlarged lymph nodes: Secondary | ICD-10-CM | POA: Diagnosis not present

## 2023-06-01 DIAGNOSIS — K8020 Calculus of gallbladder without cholecystitis without obstruction: Secondary | ICD-10-CM | POA: Diagnosis not present

## 2023-06-14 ENCOUNTER — Telehealth: Payer: Self-pay | Admitting: Cardiovascular Disease

## 2023-06-14 ENCOUNTER — Ambulatory Visit: Payer: Medicare Other | Attending: Interventional Cardiology | Admitting: Interventional Cardiology

## 2023-06-14 ENCOUNTER — Encounter: Payer: Self-pay | Admitting: Interventional Cardiology

## 2023-06-14 VITALS — BP 128/52 | HR 62 | Ht 61.0 in | Wt 174.8 lb

## 2023-06-14 DIAGNOSIS — I455 Other specified heart block: Secondary | ICD-10-CM | POA: Diagnosis not present

## 2023-06-14 DIAGNOSIS — I4819 Other persistent atrial fibrillation: Secondary | ICD-10-CM | POA: Diagnosis not present

## 2023-06-14 DIAGNOSIS — D6869 Other thrombophilia: Secondary | ICD-10-CM | POA: Insufficient documentation

## 2023-06-14 DIAGNOSIS — N1832 Chronic kidney disease, stage 3b: Secondary | ICD-10-CM | POA: Diagnosis not present

## 2023-06-14 NOTE — Progress Notes (Signed)
Cardiology Office Note   Date:  06/14/2023   ID:  Brenda, [name] 10-04-1951, MRN [id number]  PCP:  Myrlene Broker, MD    No chief complaint on file.  AFib  Wt Readings from Last 3 Encounters:  06/14/23 174 lb 12.8 oz (79.3 kg)  05/16/23 178 lb (80.7 kg)  04/25/23 178 lb 12.8 oz (81.1 kg)       History of Present Illness: Brenda [name] is a 72 y.o. 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   She was diagnosed with atrial fibrillation in May 2022 and placed on metoprolol and eliquis.  She was started on amiodarone in June 2023.   In September 2023, she complained of lightheadedness and dizziness.  A Zio patch was placed that showed sinus pauses up to about 8 seconds, so her Coreg and amiodarone were discontinued.  Additionally, her TSH was elevated at 11.7.  Pacemaker placement was deferred with a reversible cause.  Monitor placed and follow-up did not show any atrial fibrillation or significant bradycardia.  She underwent atrial fibrillation ablation on January 13, 2022.    She has had sinus bradycardia with metoprolol years ago.  Her primary symptom has been fatigue when she has atrial fibrillation.    She called the office earlier today as she felt like she was back in atrial fibrillation.      Past Medical History:  Diagnosis Date   Anxiety    Arthritis    Atrial fibrillation (HCC)    CLL (chronic lymphocytic leukemia) (HCC)    Dx. 2019 Dr. Candise Che'   Depression    Wellbutrin   Diabetes mellitus    Type II   Endometrial ca [facility])    1998   Foot fracture, left    "non-union fracture that happened 30-40 years ago"   GERD (gastroesophageal reflux disease)    Heart murmur    patient states "cardiologist has not heard heart murmur for past several years"   Heel spur    History of hiatal hernia    small   History of squamous cell carcinoma in situ  02/19/2019   [facility] - Pathology from right hand dorsal mass excision on 4.8.20   Hyperkalemia    Hyperlipidemia    Hypertension    Left leg swelling    "swelling in left leg from knee down when sitting for prolonged periods or being on feet for a long period of time"   PONV (postoperative nausea and vomiting)    after hysterectomy   Renal insufficiency    Stage 3 b   Dr. Sebastian Ache first visit 11-2019    Past Surgical History:  Procedure Laterality Date   ABDOMINAL HYSTERECTOMY  1998   ATRIAL FIBRILLATION ABLATION N/A 01/14/2023   Procedure: ATRIAL FIBRILLATION ABLATION;  Surgeon: Maurice Small, MD;  Location: [facility];  Service: Cardiovascular;  Laterality: N/A;   COLONOSCOPY     EYE SURGERY Bilateral    cataracts   EYE SURGERY Left    retina tear   FEMUR IM NAIL  10/25/2012   Procedure: INTRAMEDULLARY (IM) NAIL FEMORAL;  Surgeon: Shelda Pal, MD;  Location: [facility];  Service: Orthopedics;  Laterality: Left;   HIP SURGERY     Fracture L IM nail and 3 rods   left knee arthroscopy  12/2010   LYMPH NODE BIOPSY     axillary left 12-2018  REFRACTIVE SURGERY     skin cancer removed right and and lower forearm     squamoun in situ   TOTAL KNEE ARTHROPLASTY  12/20/2011   Procedure: TOTAL KNEE ARTHROPLASTY;  Surgeon: Loanne Drilling, MD;  Location: [facility];  Service: Orthopedics;  Laterality: Left;  Failed attempt at spinal    TOTAL SHOULDER ARTHROPLASTY Right 08/19/2016   Procedure: RIGHT TOTAL SHOULDER ARTHROPLASTY;  Surgeon: Francena Hanly, MD;  Location: [facility] OR;  Service: Orthopedics;  Laterality: Right;   TOTAL SHOULDER ARTHROPLASTY Left 01/10/2020   Procedure: TOTAL SHOULDER ARTHROPLASTY;  Surgeon: Francena Hanly, MD;  Location: [facility];  Service: Orthopedics;  Laterality: Left;      Current Outpatient Medications  Medication Sig Dispense Refill   acetaminophen (TYLENOL) 500 MG tablet Take 500-1,000 mg by mouth every 6 (six) hours as needed for mild pain,  moderate pain or fever.      ALPRAZolam (XANAX) 1 MG tablet Take 1 tablet (1 mg total) by mouth 2 (two) times daily as needed for anxiety. 60 tablet 5   amoxicillin (AMOXIL) 500 MG capsule Take 2,000 mg by mouth See admin instructions. For dental appointment     apixaban (ELIQUIS) 5 MG TABS tablet Take 1 tablet (5 mg total) by mouth 2 (two) times daily. 60 tablet 5   BD PEN NEEDLE NANO 2ND GEN 32G X 4 MM MISC USE DAILY AS DIRECTED 100 each 3   Blood Glucose Monitoring Suppl (ONETOUCH VERIO) w/Device KIT Use as advised 1 kit 0   buPROPion (WELLBUTRIN XL) 300 MG 24 hr tablet TAKE 1 TABLET(300 MG) BY MOUTH DAILY 90 tablet 3   Cholecalciferol (VITAMIN D3) 2000 units TABS Take 2,000 mcg by mouth daily.     Cyanocobalamin (B-12) 1000 MCG SUBL Place 1,000 mcg under the tongue every evening.      doxazosin (CARDURA) 2 MG tablet TAKE 1 TABLET(2 MG) BY MOUTH EVERY EVENING 90 tablet 1   esomeprazole (NEXIUM) 20 MG capsule Take 20 mg by mouth every Monday, Wednesday, and Friday.     ezetimibe (ZETIA) 10 MG tablet TAKE 1 TABLET(10 MG) BY MOUTH DAILY 90 tablet 1   glucose blood (ONETOUCH VERIO) test strip USE TO TEST BLOOD GLUCOSE THREE TIMES DAILY. 300 strip 1   insulin glargine (LANTUS SOLOSTAR) 100 UNIT/ML Solostar Pen Inject under skin 16-22 units after dinner 30 mL 3   iron polysaccharides (NIFEREX) 150 MG capsule Take 150 mg by mouth See admin instructions. Tue Thu and Sat     JARDIANCE 10 MG TABS tablet TAKE 1 TABLET(10 MG) BY MOUTH DAILY BEFORE BREAKFAST 30 tablet 11   Lancet Devices (LANCING DEVICE) MISC Use as advised - Freestyle 1 each 0   Lancets (ONETOUCH DELICA PLUS LANCET30G) MISC TEST BLOOD SUGAR THREE TIMES DAILY AS DIRECTED 300 each 1   lisinopril (ZESTRIL) 20 MG tablet Take 1 tablet (20 mg total) by mouth 2 (two) times daily. PLEASE CALL OFFICE TO SCHEDULE APPOINTMENT PRIOR TO NEXT REFILL 180 tablet 0   NONFORMULARY OR COMPOUNDED ITEM Apply 1 application topically 2 (two) times daily as  needed (sun damage on face). FLUOROURACIL 5% + CALCIPOTRIENE 0.005%     simvastatin (ZOCOR) 40 MG tablet TAKE 1 TABLET(40 MG) BY MOUTH AT BEDTIME 90 tablet 2   triamcinolone cream (KENALOG) 0.1 % Apply 1 application topically 2 (two) times daily as needed (skin rash.).      No current facility-administered medications for this visit.    Allergies:   Carvedilol, Gadolinium  derivatives, Nsaids, Amiodarone, Amlodipine, Beta adrenergic blockers, Contrast media [iodinated contrast media], and Gabapentin    Social History:  The patient  reports that she has never smoked. She has never used smokeless tobacco. She reports that she does not drink alcohol and does not use drugs.   Family History:  The patient's family history includes Cancer in her father and mother.    ROS:  Please see the history of present illness.   Otherwise, review of systems are positive for palpitations- first AFib since the ablation.   All other systems are reviewed and negative.    PHYSICAL EXAM: VS:  BP (!) 128/52   Pulse 62   Ht 5\' 1"  (1.549 m)   Wt 174 lb 12.8 oz (79.3 kg)   SpO2 95%   BMI 33.03 kg/m  , BMI Body mass index is 33.03 kg/m. GEN: Well nourished, well developed, in no acute distress HEENT: normal Neck: no JVD, carotid bruits, or masses Cardiac: irregularly irregular; no murmurs, rubs, or gallops,no edema  Respiratory:  clear to auscultation bilaterally, normal work of breathing GI: soft, nontender, nondistended, + BS MS: no deformity or atrophy Skin: warm and dry, no rash Neuro:  Strength and sensation are intact Psych: euthymic mood, full affect   EKG:   The ekg ordered today demonstrates AFib RVR   Recent Labs: 10/14/2022: Magnesium 2.0 12/17/2022: ALT 10 01/10/2023: BUN 29; Creatinine, Ser 1.58; Potassium 4.6; Sodium 137 01/14/2023: Hemoglobin 11.4; Platelets 119 04/25/2023: TSH 4.67   Lipid Panel    Component Value Date/Time   CHOL 104 07/31/2022 0500   CHOL 124 07/20/2021 0915    TRIG 193 (H) 07/31/2022 0500   HDL 21 (L) 07/31/2022 0500   HDL 29 (L) 07/20/2021 0915   CHOLHDL 5.0 07/31/2022 0500   VLDL 39 07/31/2022 0500   LDLCALC 44 07/31/2022 0500   LDLCALC 73 07/20/2021 0915   LDLDIRECT 55.0 05/15/2020 0827     Other studies Reviewed: Additional studies/ records that were reviewed today with results demonstrating: labs reviewed, Cr 1.59.   ASSESSMENT AND PLAN:  Paroxysmal atrial fibrillation: Antiarrhythmic drug choices are limited due to renal dysfunction.  She has had an ablation.  Will discuss with EP.  Plan for DCCV- scheduled for Friday.  If sx get worse, she can go to the ER and they could cardiovert her sooner since she has not missed a dose of anticoagulation recently.  Longterm, if A-fib persists, may need to consider repeat ablation attempt.  She had significant bradycardia with amiodarone and metoprolol in the past which would require pacemaker if these drugs were to be used again. Anticoagulated: Continue Eliquis for stroke prevention. CKD stage IIIb: Stay well-hydrated.  Avoid nephrotoxins.  Avoid NSAIDs.   Current medicines are reviewed at length with the patient today.  The patient concerns regarding her medicines were addressed.  The following changes have been made:  No change  Labs/ tests ordered today include:   Orders Placed This Encounter  Procedures   EKG 12-Lead    Recommend 150 minutes/week of aerobic exercise Low fat, low carb, high fiber diet recommended  Disposition:   FU in 2 weeks post cardioversion with Dr. Mariah Milling.    Signed, Lance Muss, MD  06/14/2023 4:31 PM    [facility] [address] [place], [place], [place]  [address] Phone: [phone]; Fax: [phone]

## 2023-06-14 NOTE — Patient Instructions (Signed)
Medication Instructions:  Your physician recommends that you continue on your current medications as directed. Please refer to the Current Medication list given to you today.  *If you need a refill on your cardiac medications before your next appointment, please call your pharmacy*   Lab Work: Lab work to be done today--BMP and CBC If you have labs (blood work) drawn today and your tests are completely normal, you will receive your results only by: MyChart Message (if you have MyChart) OR A paper copy in the mail If you have any lab test that is abnormal or we need to change your treatment, we will call you to review the results.   Testing/Procedures: Your physician has recommended that you have a Cardioversion (DCCV). Electrical Cardioversion uses a jolt of electricity to your heart either through paddles or wired patches attached to your chest. This is a controlled, usually prescheduled, procedure. Defibrillation is done under light anesthesia in the hospital, and you usually go home the day of the procedure. This is done to get your heart back into a normal rhythm. You are not awake for the procedure. Please see the instruction sheet given to you today.  Scheduled for August 16  Follow-Up: At [facility], you and your health needs are our priority.  As part of our continuing mission to provide you with exceptional heart care, we have created designated Provider Care Teams.  These Care Teams include your primary Cardiologist (physician) and Advanced Practice Providers (APPs -  Physician Assistants and Nurse Practitioners) who all work together to provide you with the care you need, when you need it.  We recommend signing up for the patient portal called "MyChart".  Sign up information is provided on this After Visit Summary.  MyChart is used to connect with patients for Virtual Visits (Telemedicine).  Patients are able to view lab/test results, encounter notes, upcoming appointments,  etc.  Non-urgent messages can be sent to your provider as well.   To learn more about what you can do with MyChart, go to [email or web address].    Your next appointment:   In about 2 weeks  Provider:   You may see Julien Nordmann, MDor one of the following Advanced Practice Providers on your designated Care Team:   Nicolasa Ducking, NP Eula Listen, PA-C Cadence Fransico Thomley, [name] Charlsie Quest, NP    Other Instructions     Dear Brenda [name]  You are scheduled for a Cardioversion on Friday, August 16 with Dr. Flora Lipps.  Please arrive at the [facility] (Main Entrance A) at [facility]: [address] [place], [place] [address] at 1:30 PM (This time is 1 hour(s) before your procedure to ensure your preparation). Free valet parking service is available. You will check in at ADMITTING. The support person will be asked to wait in the waiting room.  It is OK to have someone drop you off and come back when you are ready to be discharged.      DIET:  Nothing to eat or drink after midnight except a sip of water with medications (see medication instructions below)  Take half your normal insulin dose the evening before the procedure.  Do not take any insulin or diabetes medication the morning of the procedure.   MEDICATION INSTRUCTIONS: !!IF ANY NEW MEDICATIONS ARE STARTED AFTER TODAY, PLEASE NOTIFY YOUR PROVIDER AS SOON AS POSSIBLE!!  FYI: Medications such as Semaglutide (Ozempic, [place]), Tirzepatide (Mounjaro, Zepbound), Dulaglutide (Trulicity), etc ("GLP1 agonists") AND Canagliflozin (Invokana), Dapagliflozin Marcelline Deist), Empagliflozin (  Jardiance), Ertugliflozin (Steglatro), Bexagliflozin [employer]) or any combination with one of these drugs such as Invokamet (Canagliflozin/Metformin), Synjardy (Empagliflozin/Metformin), etc ("SGLT2 inhibitors") must be held around the time of a procedure. This is not a comprehensive list of all of these drugs. Please review all of your medications and talk to  your provider if you take any one of these. If you are not sure, ask your provider.  {        :1 HOLD: Empagliflozin (Jardiance) for 3 days prior to the procedure. Last dose on Tuesday, August 13.  Continue taking your anticoagulant (blood thinner): Apixaban (Eliquis).  You will need to continue this after your procedure until you are told by your provider that it is safe to stop.    LABS:   Done in office on August 13  FYI:  For your safety, and to allow [place] to monitor your vital signs accurately during the surgery/procedure we request: If you have artificial nails, gel coating, SNS etc, please have those removed prior to your surgery/procedure. Not having the nail coverings /polish removed may result in cancellation or delay of your surgery/procedure.  You must have a responsible person to drive you home and stay in the waiting area during your procedure. Failure to do so could result in cancellation.  Bring your insurance cards.  *Special Note: Every effort is made to have your procedure done on time. Occasionally there are emergencies that occur at the hospital that may cause delays. Please be patient if a delay does occur.

## 2023-06-14 NOTE — Telephone Encounter (Signed)
Patient calling in stating she is back in afib and HR "seems to be all over the place" . She states she is  using her blood pressure cuff to measure her HR and at times it reports  HR in 60's and at other times it reads 130-168 for brief periods of time. She denies any SOB or Chest pain, endorses palpitations. She had ablation in March and saw Dr. Nelly Laurence in June. Intolerant to metoprolol due to hx of sinus brady and does not use amiodarone because she says it affected her thyroid. No thyroid medication noted on med list, patient states her endocrinologist recently took her off her thyroid medication. Appointment made for DOD today at 4:30. ED precautions discussed-go to ED if chest pain or SOB develop. Patient verbalizes understanding.

## 2023-06-14 NOTE — Telephone Encounter (Signed)
STAT if HR is under 50 or over 120 (normal HR is 60-100 beats per minute)  What is your heart rate? 135 now- she is in Fib  Do you have a log of your heart rate readings (document readings)?   Do you have any other symptoms?

## 2023-06-16 ENCOUNTER — Ambulatory Visit: Payer: Medicare Other

## 2023-06-16 ENCOUNTER — Telehealth: Payer: Self-pay | Admitting: Interventional Cardiology

## 2023-06-16 VITALS — HR 71 | Ht 61.0 in | Wt 174.6 lb

## 2023-06-16 DIAGNOSIS — I4891 Unspecified atrial fibrillation: Secondary | ICD-10-CM

## 2023-06-16 NOTE — Telephone Encounter (Addendum)
Spoke with patient, she feels as if her heart may have converted back into NSR based on her lack of symptoms now and wanted to know if DCCV that is scheduled for tomorrow is still needed. Scheduled patient for an EKG nurse visit this afternoon to determine if cardioversion is still needed. No further needs at this time   Reviewed recent labs with patient.   Corky Crafts, MD 06/15/2023  8:43 PM EDT     Patient with h/o CLL which is cause of elevated WBC.

## 2023-06-16 NOTE — Patient Instructions (Addendum)
Cardioversion Canceled for tomorrow, please follow-up with Dr. Mariah Milling and Dr. Nelly Laurence as scheduled.

## 2023-06-16 NOTE — Telephone Encounter (Signed)
Pt saw Dr. Eldridge Dace and states he suggested she have a cardioversion, however pt states her hr is back down in the 80's and she wants to know if she should still have it. Please advise.

## 2023-06-16 NOTE — Progress Notes (Signed)
   Nurse Visit   Date of Encounter: 06/16/2023 ID: Brenda [name], DOB [birth year], MRN [id number]  PCP:  Myrlene Broker, MD   [facility] Providers Cardiologist:  Julien Nordmann, MD Electrophysiologist:  Maurice Small, MD      Visit Details   VS:  Pulse 71   Ht 5\' 1"  (1.549 m)   Wt 174 lb 9.6 oz (79.2 kg)   SpO2 93%   BMI 32.99 kg/m  , BMI Body mass index is 32.99 kg/m.  Wt Readings from Last 3 Encounters:  06/16/23 174 lb 9.6 oz (79.2 kg)  06/14/23 174 lb 12.8 oz (79.3 kg)  05/16/23 178 lb (80.7 kg)     Reason for visit: EKG Performed today: EKG, Consult DOD, Dr. Clifton James, Changes (medications, testing, etc.) : Per Dr. Clifton James, patient is back in normal sinus rhythm. Will cancel her cardioversion for tomorrow. Length of Visit: 30 minutes    Medications Adjustments/Labs and Tests Ordered: Orders Placed This Encounter  Procedures   EKG 12-Lead   No orders of the defined types were placed in this encounter.    Signed, Ethelda Chick, RN  06/16/2023 3:41 PM

## 2023-06-17 ENCOUNTER — Encounter (HOSPITAL_COMMUNITY): Admission: RE | Payer: Self-pay | Source: Home / Self Care

## 2023-06-17 ENCOUNTER — Ambulatory Visit (HOSPITAL_COMMUNITY): Admission: RE | Admit: 2023-06-17 | Payer: Medicare Other | Source: Home / Self Care | Admitting: Cardiovascular Disease

## 2023-06-17 ENCOUNTER — Telehealth: Payer: Self-pay | Admitting: Cardiovascular Disease

## 2023-06-17 SURGERY — CARDIOVERSION
Anesthesia: General

## 2023-06-17 NOTE — Telephone Encounter (Signed)
Patient would like to let Dr. Nelly Laurence know that she had converted without having her cardioversion procedure.

## 2023-06-17 NOTE — Telephone Encounter (Signed)
Spoke with patient, currently still feeling great and no needs at this time

## 2023-06-19 ENCOUNTER — Other Ambulatory Visit: Payer: Self-pay

## 2023-06-19 ENCOUNTER — Emergency Department (HOSPITAL_COMMUNITY)
Admission: EM | Admit: 2023-06-19 | Discharge: 2023-06-19 | Disposition: A | Discharge status: Home / Self Care | Payer: Medicare Other | Attending: Emergency Medicine | Admitting: Emergency Medicine

## 2023-06-19 ENCOUNTER — Encounter (HOSPITAL_COMMUNITY): Payer: Self-pay | Admitting: Emergency Medicine

## 2023-06-19 ENCOUNTER — Emergency Department (HOSPITAL_COMMUNITY): Payer: Medicare Other

## 2023-06-19 DIAGNOSIS — C9110 Chronic lymphocytic leukemia of B-cell type not having achieved remission: Secondary | ICD-10-CM

## 2023-06-19 DIAGNOSIS — Z7901 Long term (current) use of anticoagulants: Secondary | ICD-10-CM | POA: Diagnosis not present

## 2023-06-19 DIAGNOSIS — R0602 Shortness of breath: Secondary | ICD-10-CM | POA: Diagnosis not present

## 2023-06-19 DIAGNOSIS — Z794 Long term (current) use of insulin: Secondary | ICD-10-CM | POA: Insufficient documentation

## 2023-06-19 DIAGNOSIS — Z79899 Other long term (current) drug therapy: Secondary | ICD-10-CM | POA: Insufficient documentation

## 2023-06-19 DIAGNOSIS — N1832 Chronic kidney disease, stage 3b: Secondary | ICD-10-CM

## 2023-06-19 DIAGNOSIS — I129 Hypertensive chronic kidney disease with stage 1 through stage 4 chronic kidney disease, or unspecified chronic kidney disease: Secondary | ICD-10-CM | POA: Diagnosis not present

## 2023-06-19 DIAGNOSIS — Z96612 Presence of left artificial shoulder joint: Secondary | ICD-10-CM | POA: Diagnosis not present

## 2023-06-19 DIAGNOSIS — I4891 Unspecified atrial fibrillation: Secondary | ICD-10-CM | POA: Diagnosis not present

## 2023-06-19 DIAGNOSIS — Z7984 Long term (current) use of oral hypoglycemic drugs: Secondary | ICD-10-CM | POA: Insufficient documentation

## 2023-06-19 DIAGNOSIS — N189 Chronic kidney disease, unspecified: Secondary | ICD-10-CM | POA: Diagnosis not present

## 2023-06-19 DIAGNOSIS — D696 Thrombocytopenia, unspecified: Secondary | ICD-10-CM

## 2023-06-19 DIAGNOSIS — R000 Tachycardia, unspecified: Secondary | ICD-10-CM

## 2023-06-19 DIAGNOSIS — Z96611 Presence of right artificial shoulder joint: Secondary | ICD-10-CM | POA: Diagnosis not present

## 2023-06-19 DIAGNOSIS — R0989 Other specified symptoms and signs involving the circulatory and respiratory systems: Secondary | ICD-10-CM | POA: Diagnosis not present

## 2023-06-19 LAB — URINALYSIS, ROUTINE W REFLEX MICROSCOPIC
Bilirubin Urine: NEGATIVE
Glucose, UA: 50 mg/dL — AB
Hgb urine dipstick: NEGATIVE
Ketones, ur: NEGATIVE mg/dL
Leukocytes,Ua: NEGATIVE
Nitrite: NEGATIVE
Protein, ur: NEGATIVE mg/dL
Specific Gravity, Urine: 1.01 (ref 1.005–1.030)
pH: 5 (ref 5.0–8.0)

## 2023-06-19 LAB — CBC
HCT: 41.1 % (ref 36.0–46.0)
Hemoglobin: 13 g/dL (ref 12.0–15.0)
MCH: 29.4 pg (ref 26.0–34.0)
MCHC: 31.6 g/dL (ref 30.0–36.0)
MCV: 93 fL (ref 80.0–100.0)
Platelets: 128 10*3/uL — ABNORMAL LOW (ref 150–400)
RBC: 4.42 MIL/uL (ref 3.87–5.11)
RDW: 13.2 % (ref 11.5–15.5)
WBC: 20.4 10*3/uL — ABNORMAL HIGH (ref 4.0–10.5)
nRBC: 0 % (ref 0.0–0.2)

## 2023-06-19 LAB — BASIC METABOLIC PANEL
Anion gap: 13 (ref 5–15)
BUN: 37 mg/dL — ABNORMAL HIGH (ref 8–23)
CO2: 19 mmol/L — ABNORMAL LOW (ref 22–32)
Calcium: 9.3 mg/dL (ref 8.9–10.3)
Chloride: 109 mmol/L (ref 98–111)
Creatinine, Ser: 1.62 mg/dL — ABNORMAL HIGH (ref 0.44–1.00)
GFR, Estimated: 33 mL/min — ABNORMAL LOW (ref >60)
Glucose, Bld: 91 mg/dL (ref 70–99)
Potassium: 4.9 mmol/L (ref 3.5–5.1)
Sodium: 141 mmol/L (ref 135–145)

## 2023-06-19 LAB — T4, FREE: Free T4: 0.88 ng/dL (ref 0.61–1.12)

## 2023-06-19 LAB — TSH: TSH: 3.833 u[IU]/mL (ref 0.350–4.500)

## 2023-06-19 LAB — BRAIN NATRIURETIC PEPTIDE: B Natriuretic Peptide: 362.3 pg/mL — ABNORMAL HIGH (ref 0.0–100.0)

## 2023-06-19 MED ORDER — ACETAMINOPHEN 500 MG PO TABS: 1000.0000 mg | ORAL_TABLET | Freq: Once | ORAL | Status: DC

## 2023-06-19 MED ORDER — PROPOFOL 10 MG/ML IV BOLUS: 0.5000 mg/kg | Freq: Once | INTRAVENOUS | Status: DC

## 2023-06-19 MED ORDER — PROPOFOL 10 MG/ML IV BOLUS
INTRAVENOUS | Status: AC | PRN
  Administered 2023-06-19: 20 mg via INTRAVENOUS

## 2023-06-19 NOTE — ED Triage Notes (Signed)
Pt. Stated, Brenda [name] been going in and out of A-Fib which started on Thursday, , suppose to have converted but it did itself, Its come back and Im SOB, and fatigue.

## 2023-06-19 NOTE — ED Notes (Signed)
Consent obtained and signed, placed in medical records

## 2023-06-19 NOTE — Progress Notes (Signed)
Staff message sent to contact the patient and arrange 1-2 week EP follow up.

## 2023-06-19 NOTE — Discharge Instructions (Addendum)
It was our pleasure to provide your ER care today - we hope that you feel better. Drink adequate fluids/stay well hydrated.   We discussed your case with our cardiologist on-call who reviewed your ecg and tests - he indicates he will leave word with the EP/cardiology clinic to follow up closely with you in the next few days.  Call office tomorrow AM to verify appointment  time.   Return to ER right away if feeling worse, new symptoms, fevers/chills, new or severe pain, chest pain, trouble breathing, persistent fast heart beating, feeling weak/faint, abdominal pain, or other concern.   You were given sedating medication in the ER  - no driving for the next 12 hours.

## 2023-06-19 NOTE — ED Provider Notes (Signed)
EMERGENCY DEPARTMENT AT [facility] Provider Note   CSN: [id number] Arrival date & time: 06/19/23  0800     History  Chief Complaint  Patient presents with   Atrial Fibrillation   Fatigue   Palpitations    Brenda [name] is a 72 y.o. female.  Pt with hx afib, on eliquis, indicates has been in and out of afib in the past week. Notes prior ablation procedure and indicates that afib had been well controlled up until the past week. Was to have cardioversion a couple days ago but had converted to sinus prior to that procedure. Was told may need second ablation procedure in future. Pt w onset palpitations/sob earlier, currently improved - indicates hr was in 140s earlier today (currently 116, sinus).  Denies chest pain. No cough or uri symptoms. No fever or chills. No leg pain or swelling.   The history is provided by the patient and medical records.  Atrial Fibrillation Associated symptoms include shortness of breath. Pertinent negatives include no chest pain, no abdominal pain and no headaches.  Shortness of Breath Associated symptoms: no abdominal pain, no chest pain, no cough, no fever, no headaches, no neck pain, no rash and no vomiting        Home Medications Prior to Admission medications   Medication Sig Start Date End Date Taking? Authorizing Provider  acetaminophen (TYLENOL) 500 MG tablet Take 500-1,000 mg by mouth every 6 (six) hours as needed for mild pain, moderate pain or fever.    Yes [provider]  ALPRAZolam Prudy Feeler) 1 MG tablet Take 1 tablet (1 mg total) by mouth 2 (two) times daily as needed for anxiety. Patient taking differently: Take 1 mg by mouth 2 (two) times daily. 04/18/23  Yes Myrlene Broker, MD  amoxicillin (AMOXIL) 500 MG capsule Take 2,000 mg by mouth See admin instructions. 1 hour before dental appointment 08/05/22  Yes [provider]  apixaban (ELIQUIS) 5 MG TABS tablet Take 1 tablet (5 mg total) by mouth  2 (two) times daily. 03/08/23  Yes Gollan, Tollie Pizza, MD  BD PEN NEEDLE NANO 2ND GEN 32G X 4 MM MISC USE DAILY AS DIRECTED Patient taking differently: 1 each by Other route daily. 09/13/22  Yes Carlus Pavlov, MD  Blood Glucose Monitoring Suppl (ONETOUCH VERIO) w/Device KIT Use as advised Patient taking differently: 1 each by Other route See admin instructions. Use as advised 07/13/22  Yes Carlus Pavlov, MD  buPROPion (WELLBUTRIN XL) 300 MG 24 hr tablet TAKE 1 TABLET(300 MG) BY MOUTH DAILY Patient taking differently: Take 300 mg by mouth daily. 12/03/22  Yes Myrlene Broker, MD  Cholecalciferol (VITAMIN D3) 2000 units TABS Take 2,000 mcg by mouth daily.   Yes [provider]  Cyanocobalamin (B-12) 1000 MCG SUBL Place 1,000 mcg under the tongue every evening.  12/15/17  Yes [provider]  doxazosin (CARDURA) 2 MG tablet TAKE 1 TABLET(2 MG) BY MOUTH EVERY EVENING Patient taking differently: Take 2 mg by mouth daily. 03/14/23  Yes Antonieta Iba, MD  esomeprazole (NEXIUM) 20 MG capsule Take 20 mg by mouth every Monday, Wednesday, and Friday.   Yes [provider]  ezetimibe (ZETIA) 10 MG tablet TAKE 1 TABLET(10 MG) BY MOUTH DAILY Patient taking differently: Take 10 mg by mouth every evening. 01/17/23  Yes Gollan, Tollie Pizza, MD  glucose blood (ONETOUCH VERIO) test strip USE TO TEST BLOOD GLUCOSE THREE TIMES DAILY. Patient taking differently: 1 each by Other route See  admin instructions. USE TO TEST BLOOD GLUCOSE THREE TIMES DAILY. 02/07/23  Yes Carlus Pavlov, MD  insulin glargine (LANTUS SOLOSTAR) 100 UNIT/ML Solostar Pen Inject under skin 16-22 units after dinner 04/25/23  Yes Carlus Pavlov, MD  iron polysaccharides (NIFEREX) 150 MG capsule Take 150 mg by mouth daily.   Yes [provider]  JARDIANCE 10 MG TABS tablet TAKE 1 TABLET(10 MG) BY MOUTH DAILY BEFORE BREAKFAST Patient taking differently: Take 10 mg by mouth daily. 10/27/22  Yes Carlus Pavlov, MD  Lancet Devices (LANCING DEVICE) MISC Use as advised - Freestyle Patient taking differently: 1 each by Other route See admin instructions. Use as advised - Freestyle 05/29/19  Yes Carlus Pavlov, MD  Lancets (ONETOUCH DELICA PLUS LANCET30G) MISC TEST BLOOD SUGAR THREE TIMES DAILY AS DIRECTED Patient taking differently: 1 each by Other route in the morning, at noon, and at bedtime. 09/14/22  Yes Carlus Pavlov, MD  lisinopril (ZESTRIL) 20 MG tablet Take 1 tablet (20 mg total) by mouth 2 (two) times daily. PLEASE CALL OFFICE TO SCHEDULE APPOINTMENT PRIOR TO NEXT REFILL 05/30/23 08/28/23 Yes Gollan, Tollie Pizza, MD  NONFORMULARY OR COMPOUNDED ITEM Apply 1 application topically 2 (two) times daily as needed (sun damage on face). FLUOROURACIL 5% + CALCIPOTRIENE 0.005% 08/15/19  Yes [provider]  simvastatin (ZOCOR) 40 MG tablet TAKE 1 TABLET(40 MG) BY MOUTH AT BEDTIME Patient taking differently: Take 40 mg by mouth See admin instructions. TAKE 1 TABLET(40 MG) BY MOUTH AT BEDTIME 03/03/23  Yes Gollan, Tollie Pizza, MD  triamcinolone cream (KENALOG) 0.1 % Apply 1 application topically 2 (two) times daily as needed (skin rash.).  08/14/19  Yes [provider]      Allergies    Carvedilol, Gadolinium derivatives, Nsaids, Amiodarone, Amlodipine, Beta adrenergic blockers, Contrast media [iodinated contrast media], and Gabapentin    Review of Systems   Review of Systems  Constitutional:  Negative for chills and fever.  HENT:  Negative for sneezing.   Respiratory:  Positive for shortness of breath. Negative for cough.   Cardiovascular:  Positive for palpitations. Negative for chest pain and leg swelling.  Gastrointestinal:  Negative for abdominal pain and vomiting.  Genitourinary:  Negative for dysuria and flank pain.  Musculoskeletal:  Negative for neck pain.  Skin:  Negative for rash.  Neurological:  Negative for headaches.    Physical Exam Updated Vital Signs BP (!)  144/72   Pulse 82   Temp 98.6 F (37 C) (Oral)   Resp 14   SpO2 98%  Physical Exam Vitals and nursing note reviewed.  Constitutional:      Appearance: Normal appearance. She is well-developed.  HENT:     Head: Atraumatic.     Nose: Nose normal.     Mouth/Throat:     Mouth: Mucous membranes are moist.  Eyes:     General: No scleral icterus.    Conjunctiva/sclera: Conjunctivae normal.  Neck:     Trachea: No tracheal deviation.     Comments: Thyroid not grossly enlarged or tender.  Cardiovascular:     Rate and Rhythm: Regular rhythm. Tachycardia present.     Pulses: Normal pulses.     Heart sounds: Normal heart sounds. No murmur heard.    No friction rub. No gallop.  Pulmonary:     Effort: Pulmonary effort is normal. No respiratory distress.     Breath sounds: Normal breath sounds.  Abdominal:     General: There is no distension.     Palpations: Abdomen  is soft.     Tenderness: There is no abdominal tenderness.  Musculoskeletal:        General: No swelling or tenderness.     Cervical back: Normal range of motion and neck supple. No rigidity. No muscular tenderness.     Right lower leg: No edema.     Left lower leg: No edema.  Skin:    General: Skin is warm and dry.     Findings: No rash.  Neurological:     Mental Status: She is alert.     Comments: Alert, speech normal.   Psychiatric:        Mood and Affect: Mood normal.     ED Results / Procedures / Treatments   Labs (all labs ordered are listed, but only abnormal results are displayed) Results for orders placed or performed during the hospital encounter of 06/19/23  Basic metabolic panel  Result Value Ref Range   Sodium 141 135 - 145 mmol/L   Potassium 4.9 3.5 - 5.1 mmol/L   Chloride 109 98 - 111 mmol/L   CO2 19 (L) 22 - 32 mmol/L   Glucose, Bld 91 70 - 99 mg/dL   BUN 37 (H) 8 - 23 mg/dL   Creatinine, Ser 1.61 (H) 0.44 - 1.00 mg/dL   Calcium 9.3 8.9 - 09.6 mg/dL   GFR, Estimated 33 (L) >60 mL/min   Anion  gap 13 5 - 15  CBC  Result Value Ref Range   WBC 20.4 (H) 4.0 - 10.5 K/uL   RBC 4.42 3.87 - 5.11 MIL/uL   Hemoglobin 13.0 12.0 - 15.0 g/dL   HCT 04.5 40.9 - 81.1 %   MCV 93.0 80.0 - 100.0 fL   MCH 29.4 26.0 - 34.0 pg   MCHC 31.6 30.0 - 36.0 g/dL   RDW 91.4 78.2 - 95.6 %   Platelets 128 (L) 150 - 400 K/uL   nRBC 0.0 0.0 - 0.2 %  Brain natriuretic peptide  Result Value Ref Range   B Natriuretic Peptide 362.3 (H) 0.0 - 100.0 pg/mL  TSH  Result Value Ref Range   TSH 3.833 0.350 - 4.500 uIU/mL  T4, free  Result Value Ref Range   Free T4 0.88 0.61 - 1.12 ng/dL  Urinalysis, Routine w reflex microscopic -Urine, Clean Catch  Result Value Ref Range   Color, Urine STRAW (A) YELLOW   APPearance CLEAR CLEAR   Specific Gravity, Urine 1.010 1.005 - 1.030   pH 5.0 5.0 - 8.0   Glucose, UA 50 (A) NEGATIVE mg/dL   Hgb urine dipstick NEGATIVE NEGATIVE   Bilirubin Urine NEGATIVE NEGATIVE   Ketones, ur NEGATIVE NEGATIVE mg/dL   Protein, ur NEGATIVE NEGATIVE mg/dL   Nitrite NEGATIVE NEGATIVE   Leukocytes,Ua NEGATIVE NEGATIVE   DG Chest 2 View  Result Date: 06/19/2023 CLINICAL DATA:  Shortness of breath EXAM: CHEST - 2 VIEW COMPARISON:  CT chest dated 06/01/2023 and chest radiograph dated 10/13/2022. FINDINGS: The heart size and mediastinal contours are within normal limits. Vascular calcifications are seen in the aortic arch. There is mild pulmonary vascular congestion. No pleural effusion or pneumothorax. 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 Degenerative changes are seen in the spine. IMPRESSION: Mild pulmonary vascular congestion. Electronically Signed   By: Romona Curls M.D.   On: 06/19/2023 08:57   CT CHEST ABDOMEN PELVIS WO CONTRAST  Result Date: 06/01/2023 CLINICAL DATA:  Chronic lymphocytic leukemia, assess treatment response. * Tracking Code: BO * EXAM: CT CHEST, ABDOMEN AND PELVIS WITHOUT CONTRAST  TECHNIQUE: Multidetector CT imaging of the chest, abdomen and pelvis was  performed following the standard protocol without IV contrast. RADIATION DOSE REDUCTION: This exam was performed according to the departmental dose-optimization program which includes automated exposure control, adjustment of the mA and/or kV according to patient size and/or use of iterative reconstruction technique. COMPARISON:  Multiple priors including CT June 22, 2020 FINDINGS: CT CHEST FINDINGS Cardiovascular: Aortic atherosclerosis. Normal caliber central pulmonary arteries. Coronary artery calcifications. Calcifications of the aortic valve and annulus. Normal size heart. Small pericardial effusion. Mediastinum/Nodes: Mildly progressed bilateral axillary, subpectoral, mediastinal and pericardiophrenic adenopathy. For reference: -right axillary lymph node measures 1.8 cm in short axis on image 20/3 previously 1.9 cm. -left axillary lymph node measures 2.1 cm in short axis on image 10/3 previously 1.9 cm -subcarinal lymph node measures 19 mm in short axis on image 26/3 previously 15 mm -left paratracheal lymph node measures 13 mm in short axis on image 18/3 previously 11 mm No suspicious thyroid nodule. The esophagus is grossly unremarkable. Lungs/Pleura: Scattered pulmonary nodules are stable from prior examination. No new suspicious pulmonary nodules or masses. For reference: -Solid 4 mm right upper lobe pulmonary nodule on image 69/7 is unchanged. -solid 8 mm pulmonary nodule in the medial right middle lobe on image 88/7 previously measured 9 mm Musculoskeletal: No aggressive lytic or blastic lesion of bone. Remote bilateral rib fractures. Bilateral shoulder arthroplasties. Demineralization of bone. CT ABDOMEN PELVIS FINDINGS Hepatobiliary: Unremarkable noncontrast enhanced appearance of the hepatic parenchyma. Cholelithiasis without findings of acute cholecystitis. No biliary ductal dilation. Pancreas: No pancreatic ductal dilation or evidence of acute inflammation Spleen: No splenomegaly Adrenals/Urinary  Tract: Bilateral adrenal glands appear normal. No hydronephrosis. No renal, ureteral or bladder calculi. Urinary bladder is unremarkable for degree of distension. Stomach/Bowel: No radiopaque enteric contrast material was administered. No evidence of bowel obstruction or acute bowel inflammation. Vascular/Lymphatic: Aortic atherosclerosis. Progressive abdominopelvic adenopathy.  For reference: -porta hepatis lymph node measures 2.5 cm in short axis on image 52/3 previously 2.1 cm. -left periaortic lymph node measures 19 mm in short axis on image 71/3 previously 17 mm. -left external iliac lymph node measures 6.2 cm in short axis on image 96/3 previously 4 cm. Reproductive: Status post hysterectomy. No adnexal masses. Other: No significant abdominopelvic free fluid. Musculoskeletal: No aggressive lytic or blastic lesion of bone. Multilevel degenerative change of the spine. Grade 1 L4 on L5 degenerative anterolisthesis. Demineralization of bone. Left femoral fixation hardware. IMPRESSION: 1. Progressive adenopathy in the chest, abdomen and pelvis. 2. Stable scattered solid pulmonary nodules. 3. Cholelithiasis without findings of acute cholecystitis. 4.  Aortic Atherosclerosis (ICD10-I70.0). Electronically Signed   By: Maudry Mayhew M.D.   On: 06/01/2023 09:42     EKG EKG Interpretation Date/Time:  Sunday June 19 2023 07:59:02 EDT Ventricular Rate:  127 PR Interval:    QRS Duration:  80 QT Interval:  292 QTC Calculation: 424 R Axis:   231  Text Interpretation: Sinus tachycardia Non-specific ST-t changes Confirmed by Cathren Laine ([id number]) on 06/19/2023 9:09:15 AM  Radiology DG Chest 2 View  Result Date: 06/19/2023 CLINICAL DATA:  Shortness of breath EXAM: CHEST - 2 VIEW COMPARISON:  CT chest dated 06/01/2023 and chest radiograph dated 10/13/2022. FINDINGS: The heart size and mediastinal contours are within normal limits. Vascular calcifications are seen in the aortic arch. There is mild pulmonary  vascular congestion. No pleural effusion or pneumothorax. 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 Degenerative changes are seen in the spine. IMPRESSION: Mild pulmonary vascular congestion. Electronically  Signed   By: Romona Curls M.D.   On: 06/19/2023 08:57    Procedures .Cardioversion  Date/Time: 06/19/2023 2:45 PM  Performed by: Cathren Laine, MD Authorized by: Cathren Laine, MD   Consent:    Consent given by:  Patient Pre-procedure details:    Cardioversion basis:  Emergent   Rhythm:  Atrial fibrillation Patient sedated: Yes. Refer to sedation procedure documentation for details of sedation.  Attempt one:    Shock (Joules):  100   Shock outcome:  Conversion to normal sinus rhythm Post-procedure details:    Patient status:  Alert   Patient tolerance of procedure:  Tolerated well, no immediate complications .Sedation  Date/Time: 06/19/2023 2:45 PM  Performed by: Cathren Laine, MD Authorized by: Cathren Laine, MD   Consent:    Consent given by:  Patient Universal protocol:    Immediately prior to procedure, a time out was called: yes     Patient identity confirmed:  Arm band and verbally with patient Indications:    Procedure performed:  Cardioversion Pre-sedation assessment:    Time since last food or drink:  6   ASA classification: class 3 - patient with severe systemic disease     Mouth opening:  3 or more finger widths   Mallampati score:  II - soft palate, uvula, fauces visible   Neck mobility: normal     Pre-sedation assessments completed and reviewed: airway patency, cardiovascular function, hydration status, mental status, nausea/vomiting, pain level, respiratory function and temperature     Pre-sedation assessment completed:  06/19/2023 2:00 PM Immediate pre-procedure details:    Reassessment: Patient reassessed immediately prior to procedure     Reviewed: vital signs, relevant labs/tests and NPO status     Verified: bag valve mask available,  emergency equipment available, intubation equipment available, IV patency confirmed and oxygen available   Procedure details (see MAR for exact dosages):    Preoxygenation:  Nasal cannula   Sedation:  Propofol   Intra-procedure monitoring:  Blood pressure monitoring, cardiac monitor, continuous capnometry, continuous pulse oximetry, frequent LOC assessments and frequent vital sign checks   Intra-procedure events: none     Total Provider sedation time (minutes):  15 Post-procedure details:    Post-sedation assessment completed:  06/19/2023 2:47 PM   Attendance: Constant attendance by certified staff until patient recovered     Recovery: Patient returned to pre-procedure baseline     Post-sedation assessments completed and reviewed: airway patency, cardiovascular function, mental status, nausea/vomiting, pain level and respiratory function     Patient is stable for discharge or admission: yes     Procedure completion:  Tolerated well, no immediate complications     Medications Ordered in ED Medications  propofol (DIPRIVAN) 10 mg/mL bolus/IV push 39.6 mg (has no administration in time range)  propofol (DIPRIVAN) 10 mg/mL bolus/IV push (20 mg Intravenous Given 06/19/23 1430)    ED Course/ Medical Decision Making/ A&P                                 Medical Decision Making Problems Addressed: Atrial fibrillation status post cardioversion [facility]): acute illness or injury with systemic symptoms Atrial fibrillation with RVR (HCC): acute illness or injury with systemic symptoms that poses a threat to life or bodily functions CLL (chronic lymphocytic leukemia) (HCC): chronic illness or injury with exacerbation, progression, or side effects of treatment that poses a threat to life or bodily functions Sinus tachycardia: acute illness or  injury Stage 3b chronic kidney disease (HCC): chronic illness or injury with exacerbation, progression, or side effects of treatment that poses a threat to life or  bodily functions Thrombocytopenia (HCC): chronic illness or injury  Amount and/or Complexity of Data Reviewed External Data Reviewed: notes. Labs: ordered. Decision-making details documented in ED Course. Radiology: ordered and independent interpretation performed. Decision-making details documented in ED Course. ECG/medicine tests: ordered and independent interpretation performed. Decision-making details documented in ED Course. Discussion of management or test interpretation with external provider(s): cardiology  Risk OTC drugs. Prescription drug management. Parenteral controlled substances. Decision regarding hospitalization.  Iv ns. Continuous pulse ox and cardiac monitoring. Labs ordered/sent. Imaging ordered.   Differential diagnosis includes afib/rvr, st, anemia, etc. Dispo decision including potential need for admission considered - will get labs and imaging and reassess.   Reviewed nursing notes and prior charts for additional history. External reports reviewed. Pt indicates compliant w meds including eliquis.  Pt not currently on rate control therapy - indicates could not take amiodarone due to thyroid issues (indicates borderline thyroid fxn, last test ok and due for repeat thyroid check in a week w pcp), indicates could not take metoprolol due to bradycardia/sinus pause-arrest.    Cardiac monitor: sinus rhythm, rate 110.  Labs reviewed/interpreted by me - wbc high - pt indicates always high due to CLL. Hgb normal. Chem normal. CKD, cr c/w baseline.   Xrays reviewed/interpreted by me - ?mild vascular congestion.   Cardiology consulted re recent episodes afib - discussed pt, hx with Dr Wyline Mood, including current hr (appearing sinus, but staying in 110-120 range) - he reviewed current and prior ECGs and hx in chart - indicates patient in sinus rhythm, and it appears cannot take amiodarone, and also cannot tolerate av nodal blockers due to sinus pause/bradycardia, and indicates CKD  limits other meds. He indicates from cardiology standpoint can be d/c to home, no new med recs - he will leave word with EP team to f/u closely in the next few days, possible discussion of 2nd ablation procedure or other  therapies then.  Discussed plan w pt.  Pt continues to deny feeling sick or ill currently. No fever or chills. No chest pain or discomfort. No sob or unusual doe. No new swelling or leg pain. Currently room air sats 100%, breathing comfortably. No abd pain or nvd. No abd tenderness on exam. No dysuria.   On recheck, hr now 140s, afib.   Pt confirms has not missed any eliquis doses. No chest pain. Pt agrees to cardioversion/sedation.  Pt received propofol sedation without complication/time out performed pre-procedure. Cardioverted x 1, 100J, with conversion to nsr. Repeat ecg.   Pt denies cp or sob. In nsr. Rate 78.   Rec close cardiology/EP f/u.  CRITICAL CARE RE: atrial fib w rvr, ED procedural sedation and cardioversion Performed by: Suzi Roots Total critical care time: 45 minutes Critical care time was exclusive of separately billable procedures and treating other patients. Critical care was necessary to treat or prevent imminent or life-threatening deterioration. Critical care was time spent personally by me on the following activities: development of treatment plan with patient and/or surrogate as well as nursing, discussions with consultants, evaluation of patient's response to treatment, examination of patient, obtaining history from patient or surrogate, ordering and performing treatments and interventions, ordering and review of laboratory studies, ordering and review of radiographic studies, pulse oximetry and re-evaluation of patient's condition.          Final Clinical Impression(s) /  ED Diagnoses Final diagnoses:  Atrial fibrillation with RVR (HCC)  Sinus tachycardia  CLL (chronic lymphocytic leukemia) (HCC)  Stage 3b chronic kidney disease (HCC)   Thrombocytopenia (HCC)    Rx / DC Orders ED Discharge Orders          Ordered    Ambulatory referral to Cardiology       Comments: If you have not heard from the Cardiology office within the next 72 hours please call [phone].   06/19/23 1251              Cathren Laine, MD 06/19/23 1448

## 2023-06-20 ENCOUNTER — Encounter: Payer: Self-pay | Admitting: Internal Medicine

## 2023-06-22 ENCOUNTER — Encounter: Payer: Self-pay | Admitting: Cardiovascular Disease

## 2023-06-24 ENCOUNTER — Other Ambulatory Visit: Payer: Medicare Other

## 2023-06-24 ENCOUNTER — Ambulatory Visit: Payer: Medicare Other | Admitting: Hematology

## 2023-06-27 ENCOUNTER — Telehealth: Payer: Self-pay

## 2023-06-27 ENCOUNTER — Telehealth: Payer: Self-pay | Admitting: *Deleted

## 2023-06-27 ENCOUNTER — Other Ambulatory Visit: Payer: Medicare Other

## 2023-06-27 NOTE — Telephone Encounter (Signed)
Transition Care Management Unsuccessful Follow-up Telephone Call  Date of discharge and from where:  [facility] 8/18  Attempts:  1st Attempt  Reason for unsuccessful TCM follow-up call:  No answer/busy   Lenard Forth [place]  [facility], [facility] Guide, Phone: [phone] Website: [email or web address]

## 2023-06-27 NOTE — Progress Notes (Signed)
  Care Coordination   Note   06/27/2023 Name: [name] MRN: [id number] DOB: [birth year]  Brenda [name] is a 72 y.o. year old female who sees Myrlene Broker, MD for primary care. I reached out to [name] by phone today to offer care coordination services.  [name] was given information about Care Coordination services today including:   The Care Coordination services include support from the care team which includes your Nurse Coordinator, Clinical Social Worker, or Pharmacist.  The Care Coordination team is here to help remove barriers to the health concerns and goals most important to you. Care Coordination services are voluntary, and the patient may decline or stop services at any time by request to their care team member.   Care Coordination Consent Status: Patient agreed to services and verbal consent obtained.   Follow up plan:  Telephone appointment with care coordination team member scheduled for:  07/05/2023  Encounter Outcome:  Pt. Scheduled  Burman Nieves, CCMA Care Coordination Care Guide Direct Dial: [phone]

## 2023-06-28 ENCOUNTER — Telehealth: Payer: Self-pay

## 2023-06-28 NOTE — Telephone Encounter (Signed)
Transition Care Management Follow-up Telephone Call Date of discharge and from where: [facility] 8/18 How have you been since you were released from the hospital? Doing fine and following up with providers Any questions or concerns? Yes about the way she was treated in the ED    Items Reviewed: Did the pt receive and understand the discharge instructions provided? Yes  Medications obtained and verified? No  Other? No  Any new allergies since your discharge? No  Dietary orders reviewed? No Do you have support at home? No    Follow up appointments reviewed:  PCP Hospital f/u appt confirmed? No  Scheduled to see  on  @ . Specialist Hospital f/u appt confirmed? Yes  Scheduled to see Cardiologist  on 8/30 @ . Are transportation arrangements needed? Yes  If their condition worsens, is the pt aware to call PCP or go to the Emergency Dept.? Yes Was the patient provided with contact information for the PCP's office or ED? Yes Was to pt encouraged to call back with questions or concerns? Yes

## 2023-06-30 NOTE — Progress Notes (Signed)
Patient ID: Brenda [name], female   DOB: 1951-03-18, 72 y.o.   MRN: [id number] Cardiology Office Note  Date:  07/01/2023   ID:  Brenda, [name] [birth year], MRN [id number]  PCP:  Myrlene Broker, MD   Chief Complaint  Patient presents with   Follow up s/p cardioversion    "Doing well." Medications reviewed by the patient verbally.     HPI:  Brenda [name] is a 72 year old woman with history of coronary artery disease, CT coronary calcium score 1031 obesity, diabetes type II,  hemoglobin A1c previously 12 , now 6.1 hyperlipidemia,  with previous stress test and echocardiogram for abnormal EKG,  CLL, chronically elevated WBC 22 s/p shoulder replacement surgery Chronic renal sufficiency/nephropathy,. CR 1.5 Chronic left ankle swelling CT scan: LM and 3 vessel coronary calcium score 2568 which is 99 th percentile for age/sex Severe aortic atherosclerosis involving the arch and descending thoracic aorta History of paroxysmal atrial fibrillation atrial fibrillation ablation on January 13, 2022.   not a candidate for most AADs due to renal dysfunction, CAD who presents for routine followup of her coronary artery disease , atrial fibrillation hyperlipidemia.  Last seen by myself in clinic April 2024 Seen by one of our providers June 14, 2023 Lives alone  No significant afib from dec 2023 to 06/14/23 Recurrent atrial fibrillation in mid August, seen in the office Was set up for cardioversion , converted to NSR herself, 06/16/23, CV cancelled  Back into afib 06/19/23 Rate 140 at home Woke up in atrial fibrillation Was back into afib 06/19/23, went to the ER Cardioversion 06/19/23 in ER (was not sedated enough, felt the shock)  Has f/u with Dr. Nelly Laurence 07/29/23  EKG personally reviewed by myself on todays visit EKG Interpretation Date/Time:  Friday July 01 2023 10:54:59 EDT Ventricular Rate:  100 PR Interval:  166 QRS Duration:  84 QT Interval:  338 QTC  Calculation: 436 R Axis:   -29  Text Interpretation: Normal sinus rhythm Possible Inferior infarct , age undetermined When compared with ECG of 19-Jun-2023 14:32, No significant change was found Confirmed by Julien Nordmann [phone]) on 07/01/2023 11:05:06 AM   Other past medical history reviewed September 2023, she complained of lightheadedness and dizziness. A Zio patch was placed that showed sinus pauses up to about 8 seconds, so her Coreg and amiodarone were discontinued. Additionally, her TSH was elevated at 11.7. Pacemaker placement was deferred with a reversible cause.  Amiodarone stopped  Seen by Dr. Okey Dupre Oct 14, 2022,in clinic Went to the ER for cardioversion  Ablation 01/14/23 Has been doing well,   Other past medical history reviewed  atrial fibrillation on office visit May 2022, had mild shortness of breath A-fib on EKG Was started on Eliquis at that time, metoprolol succinate 50 twice daily   Chronic back pain  CT scan LM and 3 vessel coronary calcium score 2568 which is 99 th percentile for age/sex Severe aortic atherosclerosis involving the arch and descending thoracic aorta  In hospital 06/2020,   fall, fractured ribs, dehydration, acute renal failure  weakness and dizziness  acute kidney injury and has a serum creatinine of 4.63 compared to baseline of 1.67. Fever of 101.  So far cultures are negative.  Stool for C. difficile negative.  Patient on Rocephin and Flagyl Treated with midodrine  follow-up with Dr. Signe Colt from [facility]  PT recommended home with home services 24-hour supervision Was very SOB for weeks after she got home   hospital 11/2017 Dehydrated Lung  nodules, enlarged lymph nodes Anemic  HBG 6.6 after hydration, iron def, stool negative BP was low. 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  PSH:    Past Surgical History:  Procedure Laterality Date   ABDOMINAL HYSTERECTOMY  1998   ATRIAL FIBRILLATION ABLATION N/A 01/14/2023   Procedure: ATRIAL FIBRILLATION ABLATION;  Surgeon: Maurice Small, MD;  Location: [facility];  Service: Cardiovascular;  Laterality: N/A;   COLONOSCOPY     EYE SURGERY Bilateral    cataracts   EYE SURGERY Left    retina tear   FEMUR IM NAIL  10/25/2012   Procedure: INTRAMEDULLARY (IM) NAIL FEMORAL;  Surgeon: Shelda Pal, MD;  Location: [facility];  Service: Orthopedics;  Laterality: Left;   HIP SURGERY     Fracture L IM nail and 3 rods   left knee arthroscopy  12/2010   LYMPH NODE BIOPSY     axillary left 12-2018   REFRACTIVE SURGERY     skin cancer removed right and and lower forearm     squamoun in situ   TOTAL KNEE ARTHROPLASTY  12/20/2011   Procedure: TOTAL KNEE ARTHROPLASTY;  Surgeon: Loanne Drilling, MD;  Location: [facility];  Service: Orthopedics;  Laterality: Left;  Failed attempt at spinal    TOTAL SHOULDER ARTHROPLASTY Right 08/19/2016   Procedure: RIGHT TOTAL SHOULDER ARTHROPLASTY;  Surgeon: Francena Hanly, MD;  Location: [facility] OR;  Service: Orthopedics;  Laterality: Right;   TOTAL SHOULDER ARTHROPLASTY Left 01/10/2020   Procedure: TOTAL SHOULDER ARTHROPLASTY;  Surgeon: Francena Hanly, MD;  Location: [facility];  Service: Orthopedics;  Laterality: Left;     Current Outpatient Medications  Medication Sig Dispense Refill   acetaminophen (TYLENOL) 500 MG tablet Take 500-1,000 mg by mouth every 6 (six) hours as needed for mild pain,  moderate pain or fever.      ALPRAZolam (XANAX) 1 MG tablet Take 1 tablet (1 mg total) by mouth 2 (two) times daily as needed for anxiety. 60 tablet 5   apixaban (ELIQUIS) 5 MG TABS tablet Take 1 tablet (5 mg total) by mouth 2 (two) times daily. 60 tablet 5   buPROPion (WELLBUTRIN XL) 300 MG 24 hr tablet TAKE 1 TABLET(300 MG) BY MOUTH DAILY 90 tablet 3   Cholecalciferol (VITAMIN D3) 2000 units TABS Take 2,000 mcg by mouth daily.     Cyanocobalamin (B-12) 1000 MCG SUBL Place 1,000 mcg under the tongue every evening.      doxazosin (CARDURA) 2 MG tablet TAKE 1 TABLET(2 MG) BY MOUTH EVERY EVENING 90 tablet 1   esomeprazole (NEXIUM) 20 MG capsule Take 20 mg by mouth every Monday, Wednesday, and Friday.     ezetimibe (ZETIA) 10 MG tablet TAKE 1 TABLET(10 MG) BY MOUTH DAILY 90 tablet 1   insulin glargine (LANTUS SOLOSTAR) 100 UNIT/ML Solostar Pen Inject under skin 16-22 units after dinner 30 mL 3   iron polysaccharides (NIFEREX) 150 MG capsule Take 150 mg by mouth daily.     JARDIANCE 10 MG TABS tablet TAKE 1 TABLET(10 MG) BY MOUTH  DAILY BEFORE BREAKFAST 30 tablet 11   lisinopril (ZESTRIL) 20 MG tablet Take 1 tablet (20 mg total) by mouth 2 (two) times daily. PLEASE CALL OFFICE TO SCHEDULE APPOINTMENT PRIOR TO NEXT REFILL 180 tablet 0   simvastatin (ZOCOR) 40 MG tablet TAKE 1 TABLET(40 MG) BY MOUTH AT BEDTIME 90 tablet 2   triamcinolone cream (KENALOG) 0.1 % Apply 1 application topically 2 (two) times daily as needed (skin rash.).      amoxicillin (AMOXIL) 500 MG capsule Take 2,000 mg by mouth See admin instructions. 1 hour before dental appointment (Patient not taking: Reported on 07/01/2023)     BD PEN NEEDLE NANO 2ND GEN 32G X 4 MM MISC USE DAILY AS DIRECTED (Patient not taking: Reported on 07/01/2023) 100 each 3   Blood Glucose Monitoring Suppl (ONETOUCH VERIO) w/Device KIT Use as advised (Patient not taking: Reported on 07/01/2023) 1 kit 0   glucose blood (ONETOUCH VERIO) test strip USE TO TEST BLOOD  GLUCOSE THREE TIMES DAILY. (Patient not taking: Reported on 07/01/2023) 300 strip 1   Lancet Devices (LANCING DEVICE) MISC Use as advised - Freestyle (Patient not taking: Reported on 07/01/2023) 1 each 0   Lancets (ONETOUCH DELICA PLUS LANCET30G) MISC TEST BLOOD SUGAR THREE TIMES DAILY AS DIRECTED (Patient not taking: Reported on 07/01/2023) 300 each 1   NONFORMULARY OR COMPOUNDED ITEM Apply 1 application topically 2 (two) times daily as needed (sun damage on face). FLUOROURACIL 5% + CALCIPOTRIENE 0.005% (Patient not taking: Reported on 07/01/2023)     No current facility-administered medications for this visit.    Allergies:   Carvedilol, Gadolinium derivatives, Nsaids, Amiodarone, Amlodipine, Beta adrenergic blockers, Contrast media [iodinated contrast media], and Gabapentin   Social History:  The patient  reports that she has never smoked. She has never used smokeless tobacco. She reports that she does not drink alcohol and does not use drugs.   Family History:   family history includes Cancer in her father and mother.    Review of Systems: Review of Systems  Constitutional: Negative.   HENT: Negative.    Respiratory: Negative.    Cardiovascular: Negative.   Gastrointestinal: Negative.   Musculoskeletal:  Positive for back pain.  Neurological: Negative.   Psychiatric/Behavioral: Negative.  Suicidal ideas: .   All other systems reviewed and are negative.   PHYSICAL EXAM: VS:  BP 110/60 (BP Location: Left Arm, Patient Position: Sitting, Cuff Size: Normal)   Pulse 100   Ht 5' (1.524 m)   Wt 171 lb 8 oz (77.8 kg)   SpO2 95%   BMI 33.49 kg/m  , BMI Body mass index is 33.49 kg/m. Constitutional:  oriented to person, place, and time. No distress.  HENT:  Head: Grossly normal Eyes:  no discharge. No scleral icterus.  Neck: No JVD, no carotid bruits  Cardiovascular: Regular rate and rhythm, no murmurs appreciated Pulmonary/Chest: Clear to auscultation bilaterally, no wheezes or  rails Abdominal: Soft.  no distension.  no tenderness.  Musculoskeletal: Normal range of motion Neurological:  normal muscle tone. Coordination normal. No atrophy Skin: Skin warm and dry Psychiatric: normal affect, pleasant  Recent Labs: 10/14/2022: Magnesium 2.0 12/17/2022: ALT 10 06/19/2023: B Natriuretic Peptide 362.3; BUN 37; Creatinine, Ser 1.62; Hemoglobin 13.0; Platelets 128; Potassium 4.9; Sodium 141; TSH 3.833    Lipid Panel Lab Results  Component Value Date   CHOL 104 07/31/2022   HDL 21 (L) 07/31/2022   LDLCALC 44 07/31/2022   TRIG 193 (H) 07/31/2022      Wt  Readings from Last 3 Encounters:  07/01/23 171 lb 8 oz (77.8 kg)  06/16/23 174 lb 9.6 oz (79.2 kg)  06/14/23 174 lb 12.8 oz (79.3 kg)     ASSESSMENT AND PLAN:  Essential hypertension -  Blood pressure is well controlled on today's visit. No changes made to the medications.  Atrial fibrillation with RVR Ablation 12/2022,  Paroxysmal atrial fibrillation starting August 13, converting on her own and with recurrent A-fib August 18 requiring cardioversion in the ER (reports this was a terrible experience) She has follow-up with the EP in 3 weeks time Recommend she use diltiazem 60 mg x 1 for recurrent A-fib If no improvement in her symptoms after 2 hours, would take propranolol 20 Recommend she try Short acting agents as above given prior history of pauses on amiodarone/carvedilol -She was interested in possible repeat ablation  Type 2 diabetes mellitus with hyperglycemia, without long-term current use of insulin (HCC) Follows with endocrine,  Reports that weight is down 20 pounds over the past year  morbid obesity Weight down, sedentary  HLD (hyperlipidemia) Tolerating simvastatin, zetia Cholesterol is at goal on the current lipid regimen. No changes to the medications were made.  Anemia History of iron deficiency, followed by primary care Hemoglobin stable Chronic leukocytosis, CLL  Chronic renal  failure Followed by nephrology CR 1.6 history of poorly controlled diabetes   Total encounter time more than 40 minutes  Greater than 50% was spent in counseling and coordination of care with the patient   Orders Placed This Encounter  Procedures   EKG 12-Lead   Signed, Dossie Arbour, M.D., Ph.D. 07/01/2023  [facility] [place], [place] [phone]

## 2023-06-30 NOTE — Addendum Note (Signed)
Addended by: Pollie Meyer on: 06/30/2023 09:50 AM   Modules accepted: Orders

## 2023-07-01 ENCOUNTER — Ambulatory Visit: Payer: Medicare Other | Attending: Cardiovascular Disease | Admitting: Cardiovascular Disease

## 2023-07-01 ENCOUNTER — Ambulatory Visit: Payer: Medicare Other | Admitting: Cardiovascular Disease

## 2023-07-01 ENCOUNTER — Encounter: Payer: Self-pay | Admitting: Cardiovascular Disease

## 2023-07-01 VITALS — BP 110/60 | HR 100 | Ht 60.0 in | Wt 171.5 lb

## 2023-07-01 DIAGNOSIS — E785 Hyperlipidemia, unspecified: Secondary | ICD-10-CM | POA: Diagnosis not present

## 2023-07-01 DIAGNOSIS — N1832 Chronic kidney disease, stage 3b: Secondary | ICD-10-CM | POA: Insufficient documentation

## 2023-07-01 DIAGNOSIS — I495 Sick sinus syndrome: Secondary | ICD-10-CM | POA: Diagnosis not present

## 2023-07-01 DIAGNOSIS — I4891 Unspecified atrial fibrillation: Secondary | ICD-10-CM | POA: Diagnosis not present

## 2023-07-01 DIAGNOSIS — I2510 Atherosclerotic heart disease of native coronary artery without angina pectoris: Secondary | ICD-10-CM | POA: Diagnosis not present

## 2023-07-01 DIAGNOSIS — I4819 Other persistent atrial fibrillation: Secondary | ICD-10-CM | POA: Diagnosis not present

## 2023-07-01 DIAGNOSIS — D6869 Other thrombophilia: Secondary | ICD-10-CM | POA: Diagnosis not present

## 2023-07-01 DIAGNOSIS — I2584 Coronary atherosclerosis due to calcified coronary lesion: Secondary | ICD-10-CM | POA: Diagnosis not present

## 2023-07-01 DIAGNOSIS — I455 Other specified heart block: Secondary | ICD-10-CM | POA: Insufficient documentation

## 2023-07-01 DIAGNOSIS — E039 Hypothyroidism, unspecified: Secondary | ICD-10-CM | POA: Diagnosis not present

## 2023-07-01 DIAGNOSIS — I10 Essential (primary) hypertension: Secondary | ICD-10-CM | POA: Diagnosis not present

## 2023-07-01 MED ORDER — DILTIAZEM HCL 60 MG PO TABS: 60.0000 mg | ORAL_TABLET | Freq: Three times a day (TID) | ORAL | 3 refills | Status: DC | PRN

## 2023-07-01 MED ORDER — PROPRANOLOL HCL 20 MG PO TABS: 20.0000 mg | ORAL_TABLET | Freq: Three times a day (TID) | ORAL | 3 refills | Status: DC | PRN

## 2023-07-01 NOTE — Patient Instructions (Addendum)
Medication Instructions:  Please take diltiazem 60 mg daily PRN for atrial fibrillation If you remain in afib two hours later,  Take a propanolol 20 mg x 1  If you need a refill on your cardiac medications before your next appointment, please call your pharmacy.   Lab work: No new labs needed  Testing/Procedures: No new testing needed  Follow-Up: At [facility], you and your health needs are our priority.  As part of our continuing mission to provide you with exceptional heart care, we have created designated Provider Care Teams.  These Care Teams include your primary Cardiologist (physician) and Advanced Practice Providers (APPs -  Physician Assistants and Nurse Practitioners) who all work together to provide you with the care you need, when you need it.  You will need a follow up appointment in 6 months  Providers on your designated Care Team:   Nicolasa Ducking, NP Eula Listen, PA-C Cadence Fransico Szum[place], [name]  COVID-19 Vaccine Information can be found at: [email or web address] For questions related to vaccine distribution or appointments, please email [email or web address] or call [phone].

## 2023-07-05 ENCOUNTER — Ambulatory Visit: Payer: Self-pay | Admitting: *Deleted

## 2023-07-05 NOTE — Patient Instructions (Signed)
Visit Information  Thank you for taking time to visit with me today. Please don't hesitate to contact me if I can be of assistance to you.   Following are the goals we discussed today:  Please contact the Case Assistance and Options Counseling through [employer] [facility] [place] [phone] mailed to your home address Please contact this social worker with any additional mental health or community resource needs    If you are experiencing a Mental Health or Behavioral Health Crisis or need someone to talk to, please call the Suicide and Crisis Lifeline: 988   Patient verbalizes understanding of instructions and care plan provided today and agrees to view in [place]. Active MyChart status and patient understanding of how to access instructions and care plan via MyChart confirmed with patient.     No further follow up required: patient to contact this Child psychotherapist with any additional community resource/mental health  needs   [place], [place] Clinical Social Worker  [phone]

## 2023-07-05 NOTE — Patient Outreach (Signed)
  Care Coordination   Initial Visit Note   07/05/2023 Name: Brenda [name] MRN: [id number] DOB: 03/03/1951  Brenda [name] is a 72 y.o. year old female who sees Brenda Broker, MD for primary care. I spoke with  Brenda [name] by phone today.  What matters to the patients health and wellness today? CSW received EMMI referral for sadness. Patient assesed for mental health and community resource needs.  Goals Addressed             This Visit's Progress    Care oordination activities       Interventions Today    Flowsheet Row Most Recent Value  Chronic Disease   Chronic disease during today's visit Atrial Fibrillation (AFib), Other  [depression]  General Interventions   General Interventions Discussed/Reviewed General Interventions Discussed, Community Resources, Level of Care  [EMMI Referral-sadness patient assessed for mental health and community resource needs following her recent hospital stay care coordination program discussed,]  Level of Care Personal Care Services  [Pt confirmed that she takes care of her own ADL's , and IADL's reports having a few friends who will help when needed,con. selling her home but would need assist,  provided contact for options counseling through [employer] [facility] [place] [phone]  Mental Health Interventions   Mental Health Discussed/Reviewed Mental Health Reviewed, Coping Strategies, Depression  [Pt allowed to vent her frustration re: recent hosp stay coping strategies discussed re,  ongoing depression. Pt acknowledges taking things "Day by Day"  reading, listening  to music, declines MH f/u at this time-denied thoughts of harm to self or others]  Pharmacy Interventions   Pharmacy Dicussed/Reviewed --  [Patient reports being in the [facility], declines referral to pharmacy stating that she was previously referred and did not meet the financial eligbility for assistance]  Safety Interventions   Safety Discussed/Reviewed Safety  Reviewed  Advanced Directive Interventions   Advanced Directives Discussed/Reviewed Advanced Directives Discussed              SDOH assessments and interventions completed:  Yes  SDOH Interventions Today    Flowsheet Row Most Recent Value  SDOH Interventions   Food Insecurity Interventions Intervention Not Indicated  Housing Interventions Intervention Not Indicated  Transportation Interventions Intervention Not Indicated  Utilities Interventions Intervention Not Indicated        Care Coordination Interventions:  Yes, provided   Follow up plan: No further intervention required.   Encounter Outcome:  Pt. Visit Completed

## 2023-07-07 ENCOUNTER — Other Ambulatory Visit: Payer: Self-pay

## 2023-07-07 DIAGNOSIS — C9110 Chronic lymphocytic leukemia of B-cell type not having achieved remission: Secondary | ICD-10-CM

## 2023-07-08 ENCOUNTER — Other Ambulatory Visit: Payer: Medicare Other

## 2023-07-08 ENCOUNTER — Inpatient Hospital Stay (HOSPITAL_BASED_OUTPATIENT_CLINIC_OR_DEPARTMENT_OTHER): Payer: Medicare Other | Admitting: Hematology

## 2023-07-08 ENCOUNTER — Inpatient Hospital Stay: Payer: Medicare Other | Attending: Hematology

## 2023-07-08 ENCOUNTER — Ambulatory Visit: Payer: Medicare Other | Admitting: Hematology

## 2023-07-08 VITALS — HR 97 | Temp 97.7°F | Resp 18 | Wt 174.6 lb

## 2023-07-08 DIAGNOSIS — R918 Other nonspecific abnormal finding of lung field: Secondary | ICD-10-CM | POA: Diagnosis not present

## 2023-07-08 DIAGNOSIS — N189 Chronic kidney disease, unspecified: Secondary | ICD-10-CM | POA: Insufficient documentation

## 2023-07-08 DIAGNOSIS — Z90710 Acquired absence of both cervix and uterus: Secondary | ICD-10-CM | POA: Diagnosis not present

## 2023-07-08 DIAGNOSIS — C9110 Chronic lymphocytic leukemia of B-cell type not having achieved remission: Secondary | ICD-10-CM | POA: Diagnosis not present

## 2023-07-08 DIAGNOSIS — D509 Iron deficiency anemia, unspecified: Secondary | ICD-10-CM | POA: Insufficient documentation

## 2023-07-08 DIAGNOSIS — Z808 Family history of malignant neoplasm of other organs or systems: Secondary | ICD-10-CM | POA: Insufficient documentation

## 2023-07-08 DIAGNOSIS — I4891 Unspecified atrial fibrillation: Secondary | ICD-10-CM | POA: Insufficient documentation

## 2023-07-08 DIAGNOSIS — Z8542 Personal history of malignant neoplasm of other parts of uterus: Secondary | ICD-10-CM | POA: Diagnosis not present

## 2023-07-08 DIAGNOSIS — Z803 Family history of malignant neoplasm of breast: Secondary | ICD-10-CM | POA: Insufficient documentation

## 2023-07-08 LAB — CMP (CANCER CENTER ONLY)
ALT: 9 U/L (ref 0–44)
AST: 13 U/L — ABNORMAL LOW (ref 15–41)
Albumin: 4.4 g/dL (ref 3.5–5.0)
Alkaline Phosphatase: 64 U/L (ref 38–126)
Anion gap: 6 (ref 5–15)
BUN: 44 mg/dL — ABNORMAL HIGH (ref 8–23)
CO2: 25 mmol/L (ref 22–32)
Calcium: 9.3 mg/dL (ref 8.9–10.3)
Chloride: 108 mmol/L (ref 98–111)
Creatinine: 1.7 mg/dL — ABNORMAL HIGH (ref 0.44–1.00)
GFR, Estimated: 31 mL/min — ABNORMAL LOW (ref >60)
Glucose, Bld: 125 mg/dL — ABNORMAL HIGH (ref 70–99)
Potassium: 4.7 mmol/L (ref 3.5–5.1)
Sodium: 139 mmol/L (ref 135–145)
Total Bilirubin: 0.6 mg/dL (ref 0.3–1.2)
Total Protein: 6 g/dL — ABNORMAL LOW (ref 6.5–8.1)

## 2023-07-08 LAB — CBC WITH DIFFERENTIAL (CANCER CENTER ONLY)
Abs Immature Granulocytes: 0.05 10*3/uL (ref 0.00–0.07)
Basophils Absolute: 0.1 10*3/uL (ref 0.0–0.1)
Basophils Relative: 0 %
Eosinophils Absolute: 0.2 10*3/uL (ref 0.0–0.5)
Eosinophils Relative: 1 %
HCT: 39 % (ref 36.0–46.0)
Hemoglobin: 12.1 g/dL (ref 12.0–15.0)
Immature Granulocytes: 0 %
Lymphocytes Relative: 75 %
Lymphs Abs: 15.9 10*3/uL — ABNORMAL HIGH (ref 0.7–4.0)
MCH: 28.8 pg (ref 26.0–34.0)
MCHC: 31 g/dL (ref 30.0–36.0)
MCV: 92.9 fL (ref 80.0–100.0)
Monocytes Absolute: 1.6 10*3/uL — ABNORMAL HIGH (ref 0.1–1.0)
Monocytes Relative: 7 %
Neutro Abs: 3.7 10*3/uL (ref 1.7–7.7)
Neutrophils Relative %: 17 %
Platelet Count: 111 10*3/uL — ABNORMAL LOW (ref 150–400)
RBC: 4.2 MIL/uL (ref 3.87–5.11)
RDW: 13.5 % (ref 11.5–15.5)
Smear Review: NORMAL
WBC Count: 21.5 10*3/uL — ABNORMAL HIGH (ref 4.0–10.5)
nRBC: 0 % (ref 0.0–0.2)

## 2023-07-08 LAB — LACTATE DEHYDROGENASE: LDH: 147 U/L (ref 98–192)

## 2023-07-14 NOTE — Progress Notes (Signed)
HEMATOLOGY/ONCOLOGY CLINIC NOTE  Date of Service:07/08/2023    Patient Care Team: Myrlene Broker, MD as PCP - General (Internal Medicine) Mariah Milling Tollie Pizza, MD as PCP - Cardiology (Cardiology) Mealor, Roberts Gaudy, MD as PCP - Electrophysiology (Cardiology) Antonieta Iba, MD as Consulting Physician (Cardiology)  CHIEF COMPLAINTS/PURPOSE OF CONSULTATION:  Follow-up for continued evaluation and management of CLL  HISTORY OF PRESENTING ILLNESS:  Please see previous note for details on initial presentation  Interval History:  Brenda [name] here for continued valuation management of her CLL.  She notes no new lumps or bumps.  No new unexplained fevers chills or night sweats or significant change in energy levels. Does note issues with uncontrolled A-fib despite her ablation.  Notes that she had to go to the emergency room for a cardiac defibrillation and did not have a very good experience. No overt infection issues at this time.  MEDICAL HISTORY:  Past Medical History:  Diagnosis Date   Anxiety    Arthritis    Atrial fibrillation (HCC)    CLL (chronic lymphocytic leukemia) (HCC)    Dx. 2019 Dr. 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 Sebastian Ache first visit 11-2019    SURGICAL HISTORY: Past Surgical History:  Procedure Laterality Date   ABDOMINAL HYSTERECTOMY  1998   ATRIAL FIBRILLATION ABLATION N/A 01/14/2023   Procedure: ATRIAL FIBRILLATION ABLATION;  Surgeon: Maurice Small, MD;  Location: [facility];  Service: Cardiovascular;  Laterality: N/A;   COLONOSCOPY     EYE SURGERY Bilateral    cataracts   EYE SURGERY Left    retina tear   FEMUR IM NAIL  10/25/2012   Procedure: INTRAMEDULLARY (IM) NAIL FEMORAL;  Surgeon: Shelda Pal, MD;  Location: [facility];  Service: Orthopedics;  Laterality: Left;   HIP SURGERY     Fracture L IM nail and 3 rods   left knee arthroscopy  12/2010   LYMPH NODE BIOPSY     axillary left 12-2018   REFRACTIVE SURGERY     skin cancer removed right and and lower forearm     squamoun in situ   TOTAL KNEE ARTHROPLASTY  12/20/2011   Procedure: TOTAL KNEE ARTHROPLASTY;  Surgeon: Loanne Drilling, MD;  Location: [facility];  Service: Orthopedics;  Laterality: Left;  Failed attempt at spinal    TOTAL SHOULDER ARTHROPLASTY Right 08/19/2016   Procedure: RIGHT TOTAL SHOULDER ARTHROPLASTY;  Surgeon: Francena Hanly, MD;  Location: [facility];  Service: Orthopedics;  Laterality: Right;   TOTAL SHOULDER  ARTHROPLASTY Left 01/10/2020   Procedure: TOTAL SHOULDER ARTHROPLASTY;  Surgeon: Francena Hanly, MD;  Location: [facility];  Service: Orthopedics;  Laterality: Left;     SOCIAL HISTORY: Social History   Socioeconomic History   Marital status: Single    Spouse name: Not on file   Number of children: Not on file   Years of education: Not on file   Highest education level: Not on file  Occupational History   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    Employer:  [employer]  Tobacco Use   Smoking status: Never   Smokeless tobacco: Never  Vaping Use   Vaping status: Never Used  Substance and Sexual Activity   Alcohol use: No   Drug use: No   Sexual activity: Not Currently  Other  Topics Concern   Not on file  Social History Narrative   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   Social Determinants of Health   Financial Resource Strain: Low Risk  (03/30/2023)   Overall Financial Resource Strain (CARDIA)    Difficulty of Paying Living Expenses: Not hard at all  Food Insecurity: No Food Insecurity (07/05/2023)   Hunger Vital Sign    Worried About Running Out of Food in the Last Year: Never true    Ran Out of Food in the Last Year: Never true  Transportation Needs: No Transportation Needs (07/05/2023)   PRAPARE - Administrator, Civil Service (Medical): No    Lack of Transportation (Non-Medical): No  Physical Activity: Patient Declined (03/30/2023)   Exercise Vital Sign    Days of Exercise per Week: Patient declined    Minutes of Exercise per Session: Patient declined  Stress: Stress Concern Present (03/30/2023)   [employer] of Occupational Health - Occupational Stress Questionnaire    Feeling of Stress : To some extent  Social Connections: Unknown (03/30/2023)   Social Connection and Isolation Panel [NHANES]    Frequency of Communication with Friends and Family: Three times a week    Frequency of Social Gatherings with Friends and Family: Patient declined    Attends Religious Services: Not on file    Active Member of Clubs or Organizations: Yes    Attends Banker Meetings: More than 4 times per year    Marital Status: Never married  Intimate Partner Violence: Not At Risk (03/30/2023)   Humiliation, Afraid, Rape, and Kick questionnaire    Fear of Current or Ex-Partner: No    Emotionally Abused: No    Physically Abused: No    Sexually Abused: No    FAMILY HISTORY: Family History  Problem Relation Age of Onset   Cancer Mother        breast cancer   Cancer Father        plasma sarcoma   Breast cancer Neg Hx     ALLERGIES:  is allergic to carvedilol, gadolinium derivatives, nsaids, amiodarone, amlodipine, beta adrenergic blockers, contrast  media [iodinated contrast media], and gabapentin.  MEDICATIONS:  Current Outpatient Medications  Medication Sig Dispense Refill   acetaminophen (TYLENOL) 500 MG tablet Take 500-1,000 mg by mouth every 6 (six) hours as needed for mild pain, moderate pain or fever.      ALPRAZolam (XANAX) 1 MG tablet Take 1 tablet (1 mg total) by mouth 2 (two) times daily as needed for anxiety. 60 tablet 5   amoxicillin (AMOXIL) 500 MG capsule Take 2,000 mg by mouth See admin instructions. 1 hour before dental appointment (Patient not taking: Reported on  07/01/2023)     apixaban (ELIQUIS) 5 MG TABS tablet Take 1 tablet (5 mg total) by mouth 2 (two) times daily. 60 tablet 5   BD PEN NEEDLE NANO 2ND GEN 32G X 4 MM MISC USE DAILY AS DIRECTED (Patient not taking: Reported on 07/01/2023) 100 each 3   Blood Glucose Monitoring Suppl (ONETOUCH VERIO) w/Device KIT Use as advised (Patient not taking: Reported on 07/01/2023) 1 kit 0   buPROPion (WELLBUTRIN XL) 300 MG 24 hr tablet TAKE 1 TABLET(300 MG) BY MOUTH DAILY 90 tablet 3   Cholecalciferol (VITAMIN D3) 2000 units TABS Take 2,000 mcg by mouth daily.     Cyanocobalamin (B-12) 1000 MCG SUBL Place 1,000 mcg under the tongue every evening.      diltiazem (CARDIZEM) 60 MG tablet Take 1 tablet (60 mg total) by mouth 3 (three) times daily as needed. For afib 90 tablet 3   doxazosin (CARDURA) 2 MG tablet TAKE 1 TABLET(2 MG) BY MOUTH EVERY EVENING 90 tablet 1   esomeprazole (NEXIUM) 20 MG capsule Take 20 mg by mouth every Monday, Wednesday, and Friday.     ezetimibe (ZETIA) 10 MG tablet TAKE 1 TABLET(10 MG) BY MOUTH DAILY 90 tablet 1   glucose blood (ONETOUCH VERIO) test strip USE TO TEST BLOOD GLUCOSE THREE TIMES DAILY. (Patient not taking: Reported on 07/01/2023) 300 strip 1   insulin glargine (LANTUS SOLOSTAR) 100 UNIT/ML Solostar Pen Inject under skin 16-22 units after dinner 30 mL 3   iron polysaccharides (NIFEREX) 150 MG capsule Take 150 mg by mouth daily.     JARDIANCE 10 MG  TABS tablet TAKE 1 TABLET(10 MG) BY MOUTH DAILY BEFORE BREAKFAST 30 tablet 11   Lancet Devices (LANCING DEVICE) MISC Use as advised - Freestyle (Patient not taking: Reported on 07/01/2023) 1 each 0   Lancets (ONETOUCH DELICA PLUS LANCET30G) MISC TEST BLOOD SUGAR THREE TIMES DAILY AS DIRECTED (Patient not taking: Reported on 07/01/2023) 300 each 1   lisinopril (ZESTRIL) 20 MG tablet Take 1 tablet (20 mg total) by mouth 2 (two) times daily. PLEASE CALL OFFICE TO SCHEDULE APPOINTMENT PRIOR TO NEXT REFILL 180 tablet 0   NONFORMULARY OR COMPOUNDED ITEM Apply 1 application topically 2 (two) times daily as needed (sun damage on face). FLUOROURACIL 5% + CALCIPOTRIENE 0.005% (Patient not taking: Reported on 07/01/2023)     propranolol (INDERAL) 20 MG tablet Take 1 tablet (20 mg total) by mouth 3 (three) times daily as needed. Take 2 hours after diltiazem if afib continues 90 tablet 3   simvastatin (ZOCOR) 40 MG tablet TAKE 1 TABLET(40 MG) BY MOUTH AT BEDTIME 90 tablet 2   triamcinolone cream (KENALOG) 0.1 % Apply 1 application topically 2 (two) times daily as needed (skin rash.).      No current facility-administered medications for this visit.    REVIEW OF SYSTEMS:    10 Point review of Systems was done is negative except as noted above.   PHYSICAL EXAMINATION: ECOG PERFORMANCE STATUS: 1 - Symptomatic but completely ambulatory  Vitals:   07/08/23 0929  Pulse: 97  Resp: 18  Temp: 97.7 F (36.5 C)  SpO2: 96%   Filed Weights   07/08/23 0929  Weight: 174 lb 9.6 oz (79.2 kg)   .Body mass index is 34.1 kg/m.  GENERAL:alert, in no acute distress and comfortable SKIN: no acute rashes, no significant lesions EYES: conjunctiva are pink and non-injected, sclera anicteric OROPHARYNX: MMM, no exudates, no oropharyngeal erythema or ulceration NECK: supple, no JVD LYMPH:  no  palpable lymphadenopathy in the cervical, axillary or inguinal regions LUNGS: clear to auscultation b/l with normal respiratory  effort HEART: regular rate & rhythm ABDOMEN:  normoactive bowel sounds , non tender, not distended. Extremity: no pedal edema PSYCH: alert & oriented x 3 with fluent speech NEURO: no focal motor/sensory deficits LABORATORY DATA:  I have reviewed the data as listed  .    Latest Ref Rng & Units 07/08/2023    9:13 AM 06/19/2023    8:18 AM 06/14/2023    4:52 PM  CBC  WBC 4.0 - 10.5 K/uL 21.5  20.4  22.8   Hemoglobin 12.0 - 15.0 g/dL 54.0  98.1  19.1   Hematocrit 36.0 - 46.0 % 39.0  41.1  41.0   Platelets 150 - 400 K/uL 111  128  132    CBC    Component Value Date/Time   WBC 21.5 (H) 07/08/2023 0913   WBC 20.4 (H) 06/19/2023 0818   RBC 4.20 07/08/2023 0913   HGB 12.1 07/08/2023 0913   HGB 13.2 06/14/2023 1652   HCT 39.0 07/08/2023 0913   HCT 41.0 06/14/2023 1652   PLT 111 (L) 07/08/2023 0913   PLT 132 (L) 06/14/2023 1652   MCV 92.9 07/08/2023 0913   MCV 91 06/14/2023 1652   MCH 28.8 07/08/2023 0913   MCHC 31.0 07/08/2023 0913   RDW 13.5 07/08/2023 0913   RDW 13.9 06/14/2023 1652   LYMPHSABS 15.9 (H) 07/08/2023 0913   LYMPHSABS 19.6 (H) 12/30/2022 0907   MONOABS 1.6 (H) 07/08/2023 0913   EOSABS 0.2 07/08/2023 0913   EOSABS 0.2 12/30/2022 0907   BASOSABS 0.1 07/08/2023 0913   BASOSABS 0.1 12/30/2022 0907       Latest Ref Rng & Units 07/08/2023    9:13 AM 06/19/2023    8:18 AM 06/14/2023    4:52 PM  CMP  Glucose 70 - 99 mg/dL 478  91  295   BUN 8 - 23 mg/dL 44  37  29   Creatinine 0.44 - 1.00 mg/dL 6.21  3.08  6.57   Sodium 135 - 145 mmol/L 139  141  142   Potassium 3.5 - 5.1 mmol/L 4.7  4.9  4.6   Chloride 98 - 111 mmol/L 108  109  108   CO2 22 - 32 mmol/L 25  19  18    Calcium 8.9 - 10.3 mg/dL 9.3  9.3  9.3   Total Protein 6.5 - 8.1 g/dL 6.0     Total Bilirubin 0.3 - 1.2 mg/dL 0.6     Alkaline Phos 38 - 126 U/L 64     AST 15 - 41 U/L 13     ALT 0 - 44 U/L 9      Lab Results  Component Value Date   LDH 147 07/08/2023      12/15/17 FISH CLL Prognostic  Panel:    01/03/19 Left axillary LN biopsy:    RADIOGRAPHIC STUDIES: I have personally reviewed the radiological images as listed and agreed with the findings in the report. DG Chest 2 View  Result Date: 06/19/2023 CLINICAL DATA:  Shortness of breath EXAM: CHEST - 2 VIEW COMPARISON:  CT chest dated 06/01/2023 and chest radiograph dated 10/13/2022. FINDINGS: The heart size and mediastinal contours are within normal limits. Vascular calcifications are seen in the aortic arch. There is mild pulmonary vascular congestion. No pleural effusion or pneumothorax. 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 Degenerative changes are seen in the spine. IMPRESSION: Mild pulmonary vascular congestion. Electronically  Signed   By: Romona Curls M.D.   On: 06/19/2023 08:57    ASSESSMENT & PLAN:   Brenda [name] is a 72 y.o. 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 Lab Results  Component Value Date   LDH 147 07/08/2023   05/01/18 CT C/A/P revealed Mildly progressive lymphadenopathy in the chest, abdomen, and pelvis, corresponding to the patient's known CLL, as above. Dominant left external iliac node measures 3.3 cm short axis, previously 2.8cm. Spleen is normal in size. Numerous bilateral pulmonary nodules measuring up to 9 mm, grossly unchanged from 2017. Prior bilateral pleural effusions have resolved.   09/22/18 [place] Bilateral Axilla revealed Ultrasound is performed, showing numerous enlarged LEFT axillary lymph nodes. Largest lymph node in the LEFT axilla has cortical thickening of 9 millimeters. Largest lymph node is 3.3 centimeters in length. Evaluation of the contralateral axilla for comparison demonstrate lymph nodes with cortical thickening. Largest lymph node  in the RIGHT axilla is 3.4 x 1.3 centimeters.  01/03/19 Left axillary LN biopsy revealed CLL  2. Multiple pulmonary nodules   CT AP on 11/17/17 - with 1. Pelvic lymphadenopathy and innumerable pulmonary nodules in the lung bases, consistent with metastatic disease. 2. Cholelithiasis without CT evidence of acute cholecystitis. 3.  Aortic atherosclerosis  CT Chest on 11/20/17 - with multiple small lung nodules in LLL that appear stable from prior CT in 12/2015 and are considered benign and Upper lung nodules also appear benign. Also with several borderline enlarged lymph nodes concerning for a lymphoproliferative disorder   3. H/o endometrial cancer in 1998 -localized to uterus -treated with surgery, abdominal hysterectomy  -no evidence of recurrence  4. Microcytic anemia - Fe deficiency  Presented in hospital with Hgb of 6.6 Hgb holding steady s/p 2U PRBC - Fe infusion completed 1/19 - stool hemoccult negative - suspect this may be due to CKD, but iron deficiency also worrisome for occult GIB  -Tolerated IV iron well  (hgb stable today).  5. Marginal B12 levels in hospital  (Antiparietal celll and anti IF Ab negative) -given one B12 injection in hospital -on replacement  PLAN:   -Discussed lab results on 07/08/2023  With CBC showing some persistent leukocytosis of 21k with normal hemoglobin of 12.1 and a platelet count of 111k -CMP shows stable chronic kidney disease with a creatinine of 1.7 LDH within normal limits CT chest abdomen pelvis showed for progressive lymphadenopathy in the chest abdomen pelvis with scattered stable solid pulmonary nodules. -Patient has no clinical evidence of significant CLL progression at this time causing symptomatic disease. Patient has no clear indication to initiate CLL treatment at this time   FOLLOW UP:  RTC with Dr Candise Che with labs in 6 months  The total time spent in the appointment was 20 minutes*.  All of the patient's questions were answered  with apparent satisfaction. The patient knows to call the clinic with any problems, questions or concerns.   Wyvonnia Lora MD MS AAHIVMS [facility] Hematology/Oncology Physician [facility]  .*Total Encounter Time as defined by the Centers for Medicare and Medicaid Services includes, in addition to the face-to-face time of a patient visit (documented in the note above) non-face-to-face time: obtaining and reviewing outside history, ordering and reviewing medications, tests or procedures, care coordination (communications with other  health care professionals or caregivers) and documentation in the medical record.

## 2023-07-22 DIAGNOSIS — N1832 Chronic kidney disease, stage 3b: Secondary | ICD-10-CM | POA: Diagnosis not present

## 2023-07-22 DIAGNOSIS — C9110 Chronic lymphocytic leukemia of B-cell type not having achieved remission: Secondary | ICD-10-CM | POA: Diagnosis not present

## 2023-07-22 DIAGNOSIS — E1122 Type 2 diabetes mellitus with diabetic chronic kidney disease: Secondary | ICD-10-CM | POA: Diagnosis not present

## 2023-07-22 DIAGNOSIS — I129 Hypertensive chronic kidney disease with stage 1 through stage 4 chronic kidney disease, or unspecified chronic kidney disease: Secondary | ICD-10-CM | POA: Diagnosis not present

## 2023-07-22 DIAGNOSIS — I4891 Unspecified atrial fibrillation: Secondary | ICD-10-CM | POA: Diagnosis not present

## 2023-07-25 DIAGNOSIS — Z23 Encounter for immunization: Secondary | ICD-10-CM | POA: Diagnosis not present

## 2023-07-29 ENCOUNTER — Ambulatory Visit: Payer: Medicare Other | Admitting: Cardiovascular Disease

## 2023-08-15 ENCOUNTER — Encounter: Payer: Self-pay | Admitting: Internal Medicine

## 2023-08-15 MED ORDER — ONETOUCH VERIO VI STRP: ORAL_STRIP | 5 refills | Status: DC

## 2023-08-18 ENCOUNTER — Ambulatory Visit: Payer: Medicare Other | Admitting: Internal Medicine

## 2023-08-18 ENCOUNTER — Encounter: Payer: Self-pay | Admitting: Internal Medicine

## 2023-08-18 VITALS — BP 110/58 | HR 92 | Ht 60.0 in | Wt 170.4 lb

## 2023-08-18 DIAGNOSIS — E1169 Type 2 diabetes mellitus with other specified complication: Secondary | ICD-10-CM

## 2023-08-18 DIAGNOSIS — Z794 Long term (current) use of insulin: Secondary | ICD-10-CM | POA: Diagnosis not present

## 2023-08-18 DIAGNOSIS — E1165 Type 2 diabetes mellitus with hyperglycemia: Secondary | ICD-10-CM

## 2023-08-18 DIAGNOSIS — E032 Hypothyroidism due to medicaments and other exogenous substances: Secondary | ICD-10-CM | POA: Diagnosis not present

## 2023-08-18 DIAGNOSIS — Z7984 Long term (current) use of oral hypoglycemic drugs: Secondary | ICD-10-CM | POA: Diagnosis not present

## 2023-08-18 DIAGNOSIS — E785 Hyperlipidemia, unspecified: Secondary | ICD-10-CM

## 2023-08-18 DIAGNOSIS — Z6835 Body mass index (BMI) 35.0-35.9, adult: Secondary | ICD-10-CM

## 2023-08-18 DIAGNOSIS — E66812 Obesity, class 2: Secondary | ICD-10-CM

## 2023-08-18 LAB — LIPID PANEL
Cholesterol: 121 mg/dL (ref 0–200)
HDL: 31.1 mg/dL — ABNORMAL LOW (ref >39.00)
LDL Cholesterol: 69 mg/dL (ref 0–99)
NonHDL: 90.35
Total CHOL/HDL Ratio: 4
Triglycerides: 108 mg/dL (ref 0.0–149.0)
VLDL: 21.6 mg/dL (ref 0.0–40.0)

## 2023-08-18 LAB — POCT GLYCOSYLATED HEMOGLOBIN (HGB A1C): Hemoglobin A1C: 6.2 % — AB (ref 4.0–5.6)

## 2023-08-18 LAB — T4, FREE: Free T4: 1.01 ng/dL (ref 0.60–1.60)

## 2023-08-18 LAB — T3, FREE: T3, Free: 3.2 pg/mL (ref 2.3–4.2)

## 2023-08-18 LAB — TSH: TSH: 3.4 u[IU]/mL (ref 0.35–5.50)

## 2023-08-18 NOTE — Progress Notes (Signed)
Patient ID: Brenda [name], female   DOB: 22-Aug-1951, 72 y.o.   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 Last visit 4 months ago.  Interim history: + increased urination, but no blurry vision, nausea, chest pain.   She has Afib >> had cardioversion but it was not successful.  She now has Cardizem and propranolol at hand at home.  DM2: Reviewed HbA1c levels: Lab Results  Component Value Date   HGBA1C 6.4 (A) 04/25/2023   HGBA1C 6.1 (A) 11/26/2022   HGBA1C 6.3 (A) 07/27/2022   Patient is on: - Lantus 12 units in a.m.- started 05/2019 by PCP >> 24 >> 18-20 >> 16-18 >> 18-20 units after dinner - Jardiance 10 before breakfast-added 11/2020 She was on Glipizide in 11/2015 after a steroid inj. We stopped Victoza due to nausea. Metformin ER was stopped by PCP due to CKD. Glipizide ER was stopped 06/2019 due to low blood sugars. She was previously on glipizide XL but stopped 05/2022.  Pt checks her sugars twice a day: - am:70-100, 113 >> 78-117, 135, 145 >> 74-130, 142 >> 73-127, 133 - 2h after b'fast: 156 >> n/c >> 148 >> n/c >> 134 >> n/c - before lunch: 65-110, 125, 140 >> 78-127, 132 >> 68-121, 140 - 2h after lunch: 118, 123 >> 92, 99 >> n/c >> 145 >> n/c  - before dinner: 68-132, 145 >> 71, 74-140, 160 >> 67-123, 132 - 2h after dinner:n/c >> 138, 148 >> n/c >> 133 - bedtime: 137, 142 >> n/c >> 93-115 >> n/c >> 108 >> n/c - nighttime: n/c Lowest sugar was 37 x1 >> ... 65 >> 71>> 68; she has hypoglycemia awareness in the 70s. Highest sugar was 145 >> 170 >> 140.  Glucometer: Freestyle Lite  Pt's meals are: - Breakfast: English muffin + preserve or cereal or yoghurt - snack: fruit or fruit + yoghurt - Lunch: 1/2 sandwich + chips + salsa + fruit/sugar free >> protein bar -  Dinner: soup or chicken/steak + veggies, occasional starch or Lean cuisine or light salad No soft drinks.  -+ CKD - sees Dr. Signe Colt, last BUN/creatinine:  Lab Results  Component Value Date   BUN 44 (H) 07/08/2023   CREATININE 1.70 (H) 07/08/2023  On benazepril.  Sees nephrology. She had a high potassium (5.9) >> used Kayexalate x3 >> normalized.  She is on a low potassium diet.  -+ HL last set of lipids: Lab Results  Component Value Date   CHOL 104 07/31/2022   HDL 21 (L) 07/31/2022   LDLCALC 44 07/31/2022   LDLDIRECT 55.0 05/15/2020   TRIG 193 (H) 07/31/2022   CHOLHDL 5.0 07/31/2022  On Zocor and Zetia.  - last eye exam 04/2023: No DR; she had cataract surgery in 2016.  She has a history of retinal tear.  - no numbness and tingling in her feet.  She takes Neurontin for leg cramps at night.  Last foot exam 04/25/2023.  Acquired hypothyroidism: -Likely related to amiodarone treatment >> now off definitively  Reviewed her TFTs: Lab Results  Component Value Date   TSH 3.833 06/19/2023   TSH 4.67 04/25/2023   TSH 4.99 12/28/2022   TSH 8.742 (H) 11/17/2022   TSH 22.801 (H) 09/27/2022   TSH 11.721 (H) 07/30/2022   TSH 2.453 04/19/2022   TSH 2.640 04/14/2021   TSH  1.643 11/19/2017   TSH 2.67 03/07/2017   Lab Results  Component Value Date   FREET4 0.88 06/19/2023   FREET4 1.00 04/25/2023   FREET4 0.77 12/28/2022   FREET4 0.77 11/17/2022   FREET4 0.55 (L) 09/27/2022   T3FREE 3.0 11/17/2022   She was started on levothyroxine 25 mcg daily - started 09/2022.  However, we were able to stop the medication in 04/2023.  She also has atrial fibrillation - She was in the emergency room with A-fib with RVR 10/14/2022. She had a cardioversion. She had a cardiac ablation 12/2022.    She also has HTN, GERD, depression. She was admitted 11/2017 for dehydration, anemia, and acute renal failure >> diagnosed with CLL. She has lifelong insomnia.  In the past, she was waking up at 3 AM to  go to work at 5:30 AM.   ROS: + see HPI  I reviewed pt's medications, allergies, PMH, social hx, family hx, and changes were documented in the history of present illness. Otherwise, unchanged from my initial visit note.  Past Medical History:  Diagnosis Date   Anxiety    Arthritis    Atrial fibrillation (HCC)    CLL (chronic lymphocytic leukemia) (HCC)    Dx. 2019 Dr. 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 Sebastian Ache first visit 11-2019   Past Surgical History:  Procedure Laterality Date   ABDOMINAL HYSTERECTOMY  1998   ATRIAL FIBRILLATION ABLATION N/A 01/14/2023   Procedure: ATRIAL FIBRILLATION ABLATION;  Surgeon: Maurice Small, MD;  Location: [facility];  Service: Cardiovascular;  Laterality: N/A;   COLONOSCOPY     EYE SURGERY Bilateral    cataracts   EYE SURGERY Left    retina tear   FEMUR IM NAIL  10/25/2012   Procedure: INTRAMEDULLARY (IM) NAIL FEMORAL;  Surgeon: Shelda Pal, MD;  Location: [facility];  Service: Orthopedics;  Laterality: Left;   HIP SURGERY     Fracture L IM nail and 3 rods   left knee arthroscopy  12/2010   LYMPH NODE BIOPSY     axillary left 12-2018   REFRACTIVE SURGERY     skin cancer removed right  and and lower forearm     squamoun in situ   TOTAL KNEE ARTHROPLASTY  12/20/2011   Procedure: TOTAL KNEE ARTHROPLASTY;  Surgeon: Loanne Drilling, MD;  Location: [facility];  Service: Orthopedics;  Laterality: Left;  Failed attempt at spinal    TOTAL SHOULDER ARTHROPLASTY Right 08/19/2016   Procedure: RIGHT TOTAL SHOULDER ARTHROPLASTY;  Surgeon: Caryn Bee  Supple, MD;  Location: [facility] OR;  Service: Orthopedics;  Laterality: Right;   TOTAL SHOULDER ARTHROPLASTY Left 01/10/2020   Procedure: TOTAL SHOULDER ARTHROPLASTY;  Surgeon: Francena Hanly, MD;  Location: [facility];  Service: Orthopedics;  Laterality: Left;    Social History   Social History   Marital Status: Single    Spouse Name: N/A   Number of Children: 0   Occupational History   Charity fundraiser at [employer]    Social History Main Topics   Smoking status: Never Smoker    Smokeless tobacco: Never Used   Alcohol Use: No   Drug Use: No   Social History Designer, 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      ALPRAZolam (XANAX) 1 MG tablet Take 1 tablet (1 mg total) by mouth 2 (two) times daily as needed for anxiety. 60 tablet 5   amoxicillin (AMOXIL) 500 MG capsule Take 2,000 mg by mouth See admin instructions. 1 hour before dental appointment (Patient not taking: Reported on 07/01/2023)     apixaban (ELIQUIS) 5 MG TABS tablet Take 1 tablet (5 mg total) by mouth 2 (two) times daily. 60 tablet 5   BD PEN NEEDLE NANO 2ND GEN 32G X 4 MM MISC USE DAILY AS DIRECTED (Patient not taking: Reported on 07/01/2023) 100 each 3   Blood Glucose Monitoring Suppl (ONETOUCH VERIO) w/Device KIT Use as advised (Patient not taking: Reported on 07/01/2023) 1 kit 0   buPROPion (WELLBUTRIN XL) 300 MG 24 hr tablet TAKE 1 TABLET(300 MG) BY MOUTH DAILY 90 tablet 3    Cholecalciferol (VITAMIN D3) 2000 units TABS Take 2,000 mcg by mouth daily.     Cyanocobalamin (B-12) 1000 MCG SUBL Place 1,000 mcg under the tongue every evening.      diltiazem (CARDIZEM) 60 MG tablet Take 1 tablet (60 mg total) by mouth 3 (three) times daily as needed. For afib 90 tablet 3   doxazosin (CARDURA) 2 MG tablet TAKE 1 TABLET(2 MG) BY MOUTH EVERY EVENING 90 tablet 1   esomeprazole (NEXIUM) 20 MG capsule Take 20 mg by mouth every Monday, Wednesday, and Friday.     ezetimibe (ZETIA) 10 MG tablet TAKE 1 TABLET(10 MG) BY MOUTH DAILY 90 tablet 1   glucose blood (ONETOUCH VERIO) test strip USE TO TEST BLOOD GLUCOSE THREE TIMES DAILY. 300 strip 5   insulin glargine (LANTUS SOLOSTAR) 100 UNIT/ML Solostar Pen Inject under skin 16-22 units after dinner 30 mL 3   iron polysaccharides (NIFEREX) 150 MG capsule Take 150 mg by mouth daily.     JARDIANCE 10 MG TABS tablet TAKE 1 TABLET(10 MG) BY MOUTH DAILY BEFORE BREAKFAST 30 tablet 11   Lancet Devices (LANCING DEVICE) MISC Use as advised - Freestyle (Patient not taking: Reported on 07/01/2023) 1 each 0   Lancets (ONETOUCH DELICA PLUS LANCET30G) MISC TEST BLOOD SUGAR THREE TIMES DAILY AS DIRECTED (Patient not taking: Reported on 07/01/2023) 300 each 1   lisinopril (ZESTRIL) 20 MG tablet Take 1 tablet (20 mg total) by mouth 2 (two) times daily. PLEASE CALL OFFICE TO SCHEDULE APPOINTMENT PRIOR TO NEXT REFILL 180 tablet 0   NONFORMULARY OR COMPOUNDED ITEM Apply 1 application topically 2 (two) times daily as needed (sun damage on face). FLUOROURACIL 5% + CALCIPOTRIENE 0.005% (  Patient not taking: Reported on 07/01/2023)     propranolol (INDERAL) 20 MG tablet Take 1 tablet (20 mg total) by mouth 3 (three) times daily as needed. Take 2 hours after diltiazem if afib continues 90 tablet 3   simvastatin (ZOCOR) 40 MG tablet TAKE 1 TABLET(40 MG) BY MOUTH AT BEDTIME 90 tablet 2   triamcinolone cream (KENALOG) 0.1 % Apply 1 application topically 2 (two) times daily  as needed (skin rash.).      No current facility-administered medications on file prior to visit.   Allergies  Allergen Reactions   Carvedilol Other (See Comments)    Sinus Pauses   Gadolinium Derivatives Other (See Comments)    Effects Kidney Function    Nsaids Other (See Comments)    Due to kidney function   Amiodarone Other (See Comments)    Causes bradycardia and decreased thyroid function   Amlodipine     Swelling-  leg swell    Beta Adrenergic Blockers     Decreased heart rate   Contrast Media [Iodinated Contrast Media] Other (See Comments)    Patient states unable to take / use due to kidney function, NOT allergy!   Gabapentin Other (See Comments)    confusion   Family History  Problem Relation Age of Onset   Cancer Mother        breast cancer   Cancer Father        plasma sarcoma   Breast cancer Neg Hx    PE: BP (!) 110/58   Pulse 92   Ht 5' (1.524 m)   Wt 170 lb 6.4 oz (77.3 kg)   SpO2 92%   BMI 33.28 kg/m   Wt Readings from Last 3 Encounters:  08/18/23 170 lb 6.4 oz (77.3 kg)  07/08/23 174 lb 9.6 oz (79.2 kg)  07/01/23 171 lb 8 oz (77.8 kg)   Constitutional: overweight, in NAD Eyes: EOMI, no exophthalmos ENT: no thyromegaly, no cervical lymphadenopathy Cardiovascular: RRR, No MRG Respiratory: CTA B Musculoskeletal: no deformities Skin: no rashes Neurological: no tremor with outstretched hands  Assessment: 1. DM2, insulin-dependent, uncontrolled, with complications  - mild CKD  2. Obesity class 2  3. HL  4.  Amiodarone-induced hypothyroidism  PLAN:  1. Patient with longstanding, uncontrolled, type II/VI, with much improved control after adding insulin in summer 2020.  At that time, metformin was stopped due to worsening kidney function.  She was on Victoza but could not tolerate it due to nausea and she was reticent to retry a GLP-1 receptor agonist afterwards.  At last visit, she was only on long-acting insulin and SGLT2 inhibitor and due to  the good control, we continued these.  HbA1c was slightly higher, at 6.4%, but still at goal. -At today's visit, sugars are at goal with an occasional level in the high 60s.  I would not recommend a change in regimen for now, however, patient tells me that she was advised by cardiology about trying a GLP-1 receptor agonist.  She mentions that she would be open to try Ozempic.  She would like to check with her insurance first to see if this is covered.  I advised her to let me know.  If we are able to start Ozempic, we will most likely be able to decrease her insulin dose. - I suggested to:  Patient Instructions  Please continue: - Jardiance 10 mg before b'fast  - Lantus 18-20 units after dinner   Please stop at the lab.   Please return  in 4 months with your sugar log.  - we checked her HbA1c: 6.2% (lower) - advised to check sugars at different times of the day - 1x a day, rotating check times - advised for yearly eye exams >> she is UTD - return to clinic in 4 months  2. Obesity class 2 -She has problems eating the right foods, since she also has to limit potassium -Will continue Jardiance which should also help with weight loss -She was reticent to retry GLP-1 receptor agonist due to previous side effects and cost -we previously discussed that she continued to gain weight and unfortunately this was putting more strain on her back.  She cannot exercise due to the pain and was thinking about physical therapy.  I strongly advised her to do so.  She mentioned that she was getting severe back pain with standing for 2 to 3 hours.  I advised her to try to exercise for shorter periods of time and also to do some chair exercises -She lost 18 pounds before the last 3 visits combined after stopping metoprolol and starting exercise  3.  Hyperlipidemia -Reviewed her lipid panel from 07/2022: LDL at goal, triglycerides elevated, HDL low: Lab Results  Component Value Date   CHOL 104 07/31/2022   HDL  21 (L) 07/31/2022   LDLCALC 44 07/31/2022   LDLDIRECT 55.0 05/15/2020   TRIG 193 (H) 07/31/2022   CHOLHDL 5.0 07/31/2022  -She continues Zocor 40 mg daily and Zetia 10 mg daily without side effects -She is due for another lipid panel - will check today - fasting  4.  Amiodarone-induced hypothyroidism -Due to amiodarone, likely, now off the medication - latest thyroid labs reviewed with pt. >> normal, after stopping levothyroxine: Lab Results  Component Value Date   TSH 3.833 06/19/2023  -Will continue to keep an eye on her TFTs -Will recheck them today  Component     Latest Ref Rng 08/18/2023  Hemoglobin A1C     4.0 - 5.6 % 6.2 !   Cholesterol     0 - 200 mg/dL 098   Triglycerides     0.0 - 149.0 mg/dL 119.1   HDL Cholesterol     >39.00 mg/dL 47.82 (L)   VLDL     0.0 - 40.0 mg/dL 95.6   LDL (calc)     0 - 99 mg/dL 69   Total CHOL/HDL Ratio 4   NonHDL 90.35   TSH     0.35 - 5.50 uIU/mL 3.40   T4,Free(Direct)     0.60 - 1.60 ng/dL 2.13   Triiodothyronine,Free,Serum     2.3 - 4.2 pg/mL 3.2   Labs are normal with the exception of a slightly low HDL and an LDL above target.  I will check with her if she is taking Zocor and Zetia consistently.  Carlus Pavlov, MD PhD [facility]

## 2023-08-18 NOTE — Patient Instructions (Addendum)
Please continue: - Jardiance 10 mg before b'fast  - Lantus 18-20 units after dinner   Please stop at the lab.   Please return in 4 months with your sugar log.

## 2023-08-19 ENCOUNTER — Telehealth: Payer: Self-pay

## 2023-08-19 ENCOUNTER — Ambulatory Visit: Payer: Medicare Other | Attending: Cardiovascular Disease | Admitting: Cardiovascular Disease

## 2023-08-19 ENCOUNTER — Encounter: Payer: Self-pay | Admitting: Cardiovascular Disease

## 2023-08-19 VITALS — BP 112/62 | HR 86 | Ht 60.0 in | Wt 168.0 lb

## 2023-08-19 DIAGNOSIS — I4891 Unspecified atrial fibrillation: Secondary | ICD-10-CM | POA: Diagnosis not present

## 2023-08-19 NOTE — Telephone Encounter (Signed)
LM for pt to call back - offering to move her ablation date up to 10/21

## 2023-08-19 NOTE — Patient Instructions (Signed)
Medication Instructions:  Your physician recommends that you continue on your current medications as directed. Please refer to the Current Medication list given to you today. *If you need a refill on your cardiac medications before your next appointment, please call your pharmacy*   Testing/Procedures: Atrial Fibrillation Ablation Your physician has recommended that you have an ablation. Catheter ablation is a medical procedure used to treat some cardiac arrhythmias (irregular heartbeats). During catheter ablation, a long, thin, flexible tube is put into a blood vessel in your groin (upper thigh), or neck. This tube is called an ablation catheter. It is then guided to your heart through the blood vessel. Radio frequency waves destroy small areas of heart tissue where abnormal heartbeats may cause an arrhythmia to start. Please see the instruction sheet given to you today.  You are scheduled for Atrial Fibrillation Ablation on Tuesday, February 25 with Dr. Halford Chessman.Please arrive at the Main Entrance A at [facility]: [address] [place], [place] [address] at 5:30 AM    Follow-Up: At [facility], you and your health needs are our priority.  As part of our continuing mission to provide you with exceptional heart care, we have created designated Provider Care Teams.  These Care Teams include your primary Cardiologist (physician) and Advanced Practice Providers (APPs -  Physician Assistants and Nurse Practitioners) who all work together to provide you with the care you need, when you need it.  We recommend signing up for the patient portal called "MyChart".  Sign up information is provided on this After Visit Summary.  MyChart is used to connect with patients for Virtual Visits (Telemedicine).  Patients are able to view lab/test results, encounter notes, upcoming appointments, etc.  Non-urgent messages can be sent to your provider as well.   To learn more about what you can do with  MyChart, go to [email or web address].    Your next appointment:   Keep follow up appointment in January   Provider:   York Pellant, MD

## 2023-08-19 NOTE — Progress Notes (Signed)
Cardiology Office Note:    Date:  08/19/2023   ID:  Brenda [name], Brenda [name] Brenda [name] 14, 1952, MRN [id number]  PCP:  Myrlene Broker, MD   [facility] Providers Cardiologist:  Julien Nordmann, MD Electrophysiologist:  Maurice Small, MD     Referring MD: Myrlene Broker, *    History of Present Illness:    Brenda [name] is a 72 y.o. female with a hx listed below pertinent for CAD, DM, HTN, persistent AF, CKD IIIb-IV referred for arrhythmia management.  She was diagnosed with atrial fibrillation in May 2022 and placed on metoprolol and eliquis.  She was started on amiodarone in June 2023.  In September 2023, she complained of lightheadedness and dizziness.  A Zio patch was placed that showed sinus pauses up to about 8 seconds, so her Coreg and amiodarone were discontinued.  Additionally, her TSH was elevated at 11.7.  Pacemaker placement was deferred with a reversible cause.  Monitor placed and follow-up did not show any atrial fibrillation or significant bradycardia.  She has had sinus bradycardia with metoprolol years ago.  She reports that she has been doing very well since wearing the monitor.  She records her heart rate 4 times a day, and these have been running between 70 and 90 bpm.  She notes that when she did have atrial fibrillation, her primary symptom was fatigue.  She did not have significant palpitations.  She could appreciate a regular rhythm during pulse checks.  She is a retired Engineer, civil (consulting).  She underwent atrial fibrillation ablation on January 14, 2023.    She did well for some time after her ablation; he had a few recurrences of atrial fibrillation and August 2024 and eventually underwent cardioversion in the ER.  She was not adequately sedated and felt the shock.       EKGs/Labs/Other Studies Reviewed:     EKG Interpretation Date/Time:  Friday August 19 2023 08:47:41 EDT Ventricular Rate:  86 PR Interval:  178 QRS Duration:  80 QT  Interval:  350 QTC Calculation: 418 R Axis:   -3  Text Interpretation: Normal sinus rhythm Low voltage QRS When compared with ECG of 30-Brenda [name]-2024 10:54, No significant change was found Confirmed by York Pellant [id number]) on 08/19/2023 9:00:00 AM     Recent Labs: 10/14/2022: Magnesium 2.0 06/19/2023: B Natriuretic Peptide 362.3 07/08/2023: ALT 9; BUN 44; Creatinine 1.70; Hemoglobin 12.1; Platelet Count 111; Potassium 4.7; Sodium 139 08/18/2023: TSH 3.40       Physical Exam:    VS:  BP 112/62   Pulse 86   Ht 5' (1.524 m)   Wt 168 lb (76.2 kg)   SpO2 98%   BMI 32.81 kg/m     Wt Readings from Last 3 Encounters:  08/19/23 168 lb (76.2 kg)  08/18/23 170 lb 6.4 oz (77.3 kg)  07/08/23 174 lb 9.6 oz (79.2 kg)     GEN:  Well nourished, well developed in no acute distress CARDIAC: RRR, no murmurs, rubs, gallops RESPIRATORY:  Normal work of breathing MUSCULOSKELETAL: no edema    ASSESSMENT & PLAN:    Paroxysmal atrial fibrillation:  she notes fatigue when in AF.  not a candidate for most AADs due to renal dysfunction.  Now having recurrence of atrial fibrillation We discussed the indication and rationale for repeat ablation to maintain sinus rhythm Continue diltiazem 60 mg as needed for RVR  We discussed the indication, rationale, logistics, anticipated benefits, and potential risks of the ablation procedure including but not  limited to -- bleed at the groin access site, chest pain, damage to nearby organs, need for a drainage tube, or prolonged hospitalization. I explained that the risk for stroke, heart attack, need for open chest surgery, or even death is very low but not zero. she  expressed understanding and wishes to proceed.   Secondary hypercoagulable state: Continue apixaban 5mg  PO BID  Obesity: I reinforced her weight loss efforts.  DM II  CKD IV: precludes use of most AADs.          Medication Adjustments/Labs and Tests Ordered: Current medicines are  reviewed at length with the patient today.  Concerns regarding medicines are outlined above.  Orders Placed This Encounter  Procedures   EKG 12-Lead   No orders of the defined types were placed in this encounter.    Signed, Maurice Small, MD  08/19/2023 8:59 AM    [facility]

## 2023-08-19 NOTE — H&P (View-Only) (Signed)
Cardiology Office Note:    Date:  08/19/2023   ID:  Brenda, [name] Jun 14, 1951, MRN [id number]  PCP:  Myrlene Broker, MD   [facility] Providers Cardiologist:  Julien Nordmann, MD Electrophysiologist:  Maurice Small, MD     Referring MD: Myrlene Broker, *    History of Present Illness:    Brenda [name] is a 72 y.o. female with a hx listed below pertinent for CAD, DM, HTN, persistent AF, CKD IIIb-IV referred for arrhythmia management.  She was diagnosed with atrial fibrillation in May 2022 and placed on metoprolol and eliquis.  She was started on amiodarone in June 2023.  In September 2023, she complained of lightheadedness and dizziness.  A Zio patch was placed that showed sinus pauses up to about 8 seconds, so her Coreg and amiodarone were discontinued.  Additionally, her TSH was elevated at 11.7.  Pacemaker placement was deferred with a reversible cause.  Monitor placed and follow-up did not show any atrial fibrillation or significant bradycardia.  She has had sinus bradycardia with metoprolol years ago.  She reports that she has been doing very well since wearing the monitor.  She records her heart rate 4 times a day, and these have been running between 70 and 90 bpm.  She notes that when she did have atrial fibrillation, her primary symptom was fatigue.  She did not have significant palpitations.  She could appreciate a regular rhythm during pulse checks.  She is a retired Engineer, civil (consulting).  She underwent atrial fibrillation ablation on January 14, 2023.    She did well for some time after her ablation; he had a few recurrences of atrial fibrillation and August 2024 and eventually underwent cardioversion in the ER.  She was not adequately sedated and felt the shock.       EKGs/Labs/Other Studies Reviewed:     EKG Interpretation Date/Time:  Friday August 19 2023 08:47:41 EDT Ventricular Rate:  86 PR Interval:  178 QRS Duration:  80 QT  Interval:  350 QTC Calculation: 418 R Axis:   -3  Text Interpretation: Normal sinus rhythm Low voltage QRS When compared with ECG of 01-Jul-2023 10:54, No significant change was found Confirmed by York Pellant [id number]) on 08/19/2023 9:00:00 AM     Recent Labs: 10/14/2022: Magnesium 2.0 06/19/2023: B Natriuretic Peptide 362.3 07/08/2023: ALT 9; BUN 44; Creatinine 1.70; Hemoglobin 12.1; Platelet Count 111; Potassium 4.7; Sodium 139 08/18/2023: TSH 3.40       Physical Exam:    VS:  BP 112/62   Pulse 86   Ht 5' (1.524 m)   Wt 168 lb (76.2 kg)   SpO2 98%   BMI 32.81 kg/m     Wt Readings from Last 3 Encounters:  08/19/23 168 lb (76.2 kg)  08/18/23 170 lb 6.4 oz (77.3 kg)  07/08/23 174 lb 9.6 oz (79.2 kg)     GEN:  Well nourished, well developed in no acute distress CARDIAC: RRR, no murmurs, rubs, gallops RESPIRATORY:  Normal work of breathing MUSCULOSKELETAL: no edema    ASSESSMENT & PLAN:    Paroxysmal atrial fibrillation:  she notes fatigue when in AF.  not a candidate for most AADs due to renal dysfunction.  Now having recurrence of atrial fibrillation We discussed the indication and rationale for repeat ablation to maintain sinus rhythm Continue diltiazem 60 mg as needed for RVR  We discussed the indication, rationale, logistics, anticipated benefits, and potential risks of the ablation procedure including but not  limited to -- bleed at the groin access site, chest pain, damage to nearby organs, need for a drainage tube, or prolonged hospitalization. I explained that the risk for stroke, heart attack, need for open chest surgery, or even death is very low but not zero. she  expressed understanding and wishes to proceed.   Secondary hypercoagulable state: Continue apixaban 5mg  PO BID  Obesity: I reinforced her weight loss efforts.  DM II  CKD IV: precludes use of most AADs.          Medication Adjustments/Labs and Tests Ordered: Current medicines are  reviewed at length with the patient today.  Concerns regarding medicines are outlined above.  Orders Placed This Encounter  Procedures   EKG 12-Lead   No orders of the defined types were placed in this encounter.    Signed, Maurice Small, MD  08/19/2023 8:59 AM    [facility]

## 2023-08-22 ENCOUNTER — Encounter: Payer: Self-pay | Admitting: Cardiovascular Disease

## 2023-08-22 ENCOUNTER — Telehealth: Payer: Self-pay

## 2023-08-22 ENCOUNTER — Telehealth: Payer: Self-pay | Admitting: Cardiovascular Disease

## 2023-08-22 NOTE — Telephone Encounter (Signed)
Spoke with patient, back into atrial fibrillation with heart rates into 130-140's. Patient has been utilizing her as needed diltiazem and propranolol which has helped but since these are short acting and rates quickly return. Patient states she has tried the extended released beta blockers in the past and antiarrhythmic medications but was unable to tolerate them. Patient requesting to be set up for another cardioversion, will forward to Dr Nelly Laurence for recommendation.

## 2023-08-22 NOTE — Telephone Encounter (Signed)
  Pt's friend Shawna Orleans calling to follow up [employer] sent today. She said, pt is still in afib and currently crying because she doesn't know what she need to do. She requested if a nurse can call her back as soon as possible

## 2023-08-22 NOTE — Telephone Encounter (Signed)
Attempted to contact patient, left message to call our office back

## 2023-08-23 NOTE — Telephone Encounter (Signed)
Spoke with patient, DCCV scheduled for 08/24/23. Labs and EKG will be completed prior to DCCV since patient has transportation issues, confirmed with cath lab.  Moved patients ablation from Feb 2025 to Dec 2024.  No further needs at this time  Mealor, Roberts Gaudy, MD  You3 hours ago (10:21 AM)   That's fine. We should have her on the list to get her ablation done sooner rather than later.   You  Mealor, Roberts Gaudy, MD20 hours ago (5:21 PM)   Patient requesting cardioversion - want me to set this up for her?

## 2023-08-23 NOTE — OR Nursing (Signed)
Called patient with pre-procedure instructions for tomorrow.   Patient informed of:   Time to arrive for procedure. 0800 Remain NPO past midnight.  Must have a ride home and a responsible adult to remain with them for 24 ours post procedure.  Confirmed blood thinner. Confirmed no breaks in taking blood thinner for 3+ weeks prior to procedure. Confirmed patient stopped all GLP-1s and GLP-2s for at least one week before procedure.   Spoke with patent regarding above information.

## 2023-08-24 ENCOUNTER — Other Ambulatory Visit: Payer: Self-pay

## 2023-08-24 ENCOUNTER — Telehealth: Payer: Self-pay | Admitting: Cardiology

## 2023-08-24 ENCOUNTER — Ambulatory Visit (HOSPITAL_COMMUNITY)
Admission: RE | Admit: 2023-08-24 | Discharge: 2023-08-24 | Disposition: A | Discharge status: Home / Self Care | Payer: Medicare Other | Attending: Cardiology | Admitting: Cardiology

## 2023-08-24 ENCOUNTER — Encounter (HOSPITAL_COMMUNITY)
Admission: RE | Disposition: A | Discharge status: Home / Self Care | Payer: Self-pay | Source: Home / Self Care | Attending: Cardiology

## 2023-08-24 ENCOUNTER — Ambulatory Visit (HOSPITAL_BASED_OUTPATIENT_CLINIC_OR_DEPARTMENT_OTHER): Payer: Medicare Other | Admitting: Anesthesiology

## 2023-08-24 ENCOUNTER — Ambulatory Visit (HOSPITAL_COMMUNITY): Payer: Medicare Other | Admitting: Anesthesiology

## 2023-08-24 DIAGNOSIS — I4892 Unspecified atrial flutter: Secondary | ICD-10-CM | POA: Insufficient documentation

## 2023-08-24 DIAGNOSIS — D6869 Other thrombophilia: Secondary | ICD-10-CM | POA: Diagnosis not present

## 2023-08-24 DIAGNOSIS — I4891 Unspecified atrial fibrillation: Secondary | ICD-10-CM

## 2023-08-24 DIAGNOSIS — E669 Obesity, unspecified: Secondary | ICD-10-CM | POA: Diagnosis not present

## 2023-08-24 DIAGNOSIS — N184 Chronic kidney disease, stage 4 (severe): Secondary | ICD-10-CM | POA: Insufficient documentation

## 2023-08-24 DIAGNOSIS — I129 Hypertensive chronic kidney disease with stage 1 through stage 4 chronic kidney disease, or unspecified chronic kidney disease: Secondary | ICD-10-CM | POA: Insufficient documentation

## 2023-08-24 DIAGNOSIS — R5383 Other fatigue: Secondary | ICD-10-CM | POA: Diagnosis not present

## 2023-08-24 DIAGNOSIS — I2510 Atherosclerotic heart disease of native coronary artery without angina pectoris: Secondary | ICD-10-CM | POA: Diagnosis not present

## 2023-08-24 DIAGNOSIS — I4819 Other persistent atrial fibrillation: Secondary | ICD-10-CM | POA: Diagnosis not present

## 2023-08-24 DIAGNOSIS — Z6832 Body mass index (BMI) 32.0-32.9, adult: Secondary | ICD-10-CM | POA: Insufficient documentation

## 2023-08-24 DIAGNOSIS — Z7901 Long term (current) use of anticoagulants: Secondary | ICD-10-CM | POA: Insufficient documentation

## 2023-08-24 DIAGNOSIS — E1122 Type 2 diabetes mellitus with diabetic chronic kidney disease: Secondary | ICD-10-CM | POA: Diagnosis not present

## 2023-08-24 DIAGNOSIS — I495 Sick sinus syndrome: Secondary | ICD-10-CM | POA: Diagnosis not present

## 2023-08-24 DIAGNOSIS — I484 Atypical atrial flutter: Secondary | ICD-10-CM

## 2023-08-24 HISTORY — PX: CARDIOVERSION: SHX1299

## 2023-08-24 LAB — POCT I-STAT, CHEM 8
BUN: 29 mg/dL — ABNORMAL HIGH (ref 8–23)
Calcium, Ion: 1.26 mmol/L (ref 1.15–1.40)
Chloride: 106 mmol/L (ref 98–111)
Creatinine, Ser: 1.7 mg/dL — ABNORMAL HIGH (ref 0.44–1.00)
Glucose, Bld: 107 mg/dL — ABNORMAL HIGH (ref 70–99)
HCT: 38 % (ref 36.0–46.0)
Hemoglobin: 12.9 g/dL (ref 12.0–15.0)
Potassium: 4.4 mmol/L (ref 3.5–5.1)
Sodium: 141 mmol/L (ref 135–145)
TCO2: 21 mmol/L — ABNORMAL LOW (ref 22–32)

## 2023-08-24 SURGERY — CARDIOVERSION
Anesthesia: Monitor Anesthesia Care

## 2023-08-24 MED ORDER — LIDOCAINE 2% (20 MG/ML) 5 ML SYRINGE
INTRAMUSCULAR | Status: DC | PRN
  Administered 2023-08-24: 60 mg via INTRAVENOUS

## 2023-08-24 MED ORDER — DILTIAZEM HCL 60 MG PO TABS: 60.0000 mg | ORAL_TABLET | Freq: Three times a day (TID) | ORAL | Status: DC | PRN

## 2023-08-24 MED ORDER — PROPOFOL 10 MG/ML IV BOLUS
INTRAVENOUS | Status: DC | PRN
  Administered 2023-08-24: 50 mg via INTRAVENOUS

## 2023-08-24 MED ORDER — DILTIAZEM HCL 60 MG PO TABS: 60.0000 mg | ORAL_TABLET | Freq: Three times a day (TID) | ORAL | Status: DC

## 2023-08-24 SURGICAL SUPPLY — 1 items: PAD DEFIB RADIO PHYSIO CONN (PAD) ×1 IMPLANT

## 2023-08-24 NOTE — Anesthesia Postprocedure Evaluation (Signed)
Anesthesia Post Note  Patient: Brenda [name]  Procedure(s) Performed: CARDIOVERSION     Patient location during evaluation: PACU Anesthesia Type: MAC Level of consciousness: awake and alert Pain management: pain level controlled Vital Signs Assessment: post-procedure vital signs reviewed and stable Respiratory status: spontaneous breathing, nonlabored ventilation, respiratory function stable and patient connected to nasal cannula oxygen Cardiovascular status: stable and blood pressure returned to baseline Postop Assessment: no apparent nausea or vomiting Anesthetic complications: no   There were no known notable events for this encounter.  Last Vitals:  Vitals:   08/24/23 0952 08/24/23 1002  BP: (!) 99/48 113/60  Pulse: 65 68  Resp: 11 14  Temp:    SpO2: 98% 98%    Last Pain:  Vitals:   08/24/23 1002  TempSrc:   PainSc: 0-No pain                 Mariann Barter

## 2023-08-24 NOTE — Transfer of Care (Signed)
Immediate Anesthesia Transfer of Care Note  Patient: [name]  Procedure(s) Performed: CARDIOVERSION  Patient Location: Cath Lab  Anesthesia Type:MAC  Level of Consciousness: drowsy and patient cooperative  Airway & Oxygen Therapy: Patient Spontanous Breathing and Patient connected to nasal cannula oxygen  Post-op Assessment: Report given to RN and Post -op Vital signs reviewed and stable  Post vital signs: Reviewed and stable  Last Vitals:  Vitals Value Taken Time  BP 97/47   Temp    Pulse 64 08/24/23 0922  Resp 17 08/24/23 0922  SpO2 96 % 08/24/23 0922  Vitals shown include unfiled device data.  Last Pain:  Vitals:   08/24/23 0758  TempSrc: Temporal         Complications: There were no known notable events for this encounter.

## 2023-08-24 NOTE — Interval H&P Note (Signed)
History and Physical Interval Note:  08/24/2023 7:51 AM  Brenda [name]  has presented today for surgery, with the diagnosis of afib.  The various methods of treatment have been discussed with the patient and family. After consideration of risks, benefits and other options for treatment, the patient has consented to  Procedure(s): CARDIOVERSION (N/A) as a surgical intervention.  The patient's history has been reviewed, patient examined, no change in status, stable for surgery.  I have reviewed the patient's chart and labs.  Questions were answered to the patient's satisfaction.     [name]

## 2023-08-24 NOTE — Anesthesia Preprocedure Evaluation (Signed)
Anesthesia Evaluation  Patient identified by MRN, date of birth, ID band Patient awake    Reviewed: Allergy & Precautions, NPO status , Patient's Chart, lab work & pertinent test results, reviewed documented beta blocker date and time   History of Anesthesia Complications (+) PONV and history of anesthetic complications  Airway Mallampati: III  TM Distance: >3 FB Neck ROM: Full    Dental no notable dental hx.    Pulmonary neg sleep apnea, neg COPD   breath sounds clear to auscultation       Cardiovascular hypertension, (-) angina + CAD  (-) Past MI and (-) Cardiac Stents + dysrhythmias Atrial Fibrillation + Valvular Problems/Murmurs MR  Rhythm:Irregular Rate:Normal     Neuro/Psych neg Seizures PSYCHIATRIC DISORDERS Anxiety Depression     Neuromuscular disease    GI/Hepatic hiatal hernia,GERD  ,,  Endo/Other  diabetes, Type 2, Insulin Dependent    Renal/GU CRFRenal disease     Musculoskeletal  (+) Arthritis ,    Abdominal   Peds  Hematology   Anesthesia Other Findings   Reproductive/Obstetrics                              Anesthesia Physical Anesthesia Plan  ASA: 3  Anesthesia Plan: MAC   Post-op Pain Management:    Induction:   PONV Risk Score and Plan: 2 and Propofol infusion  Airway Management Planned:   Additional Equipment:   Intra-op Plan:   Post-operative Plan:   Informed Consent: I have reviewed the patients History and Physical, chart, labs and discussed the procedure including the risks, benefits and alternatives for the proposed anesthesia with the patient or authorized representative who has indicated his/her understanding and acceptance.     Dental advisory given  Plan Discussed with: CRNA  Anesthesia Plan Comments:          Anesthesia Quick Evaluation

## 2023-08-24 NOTE — Discharge Instructions (Signed)
Electrical Cardioversion °Electrical cardioversion is the delivery of a jolt of electricity to restore a normal rhythm to the heart. A rhythm that is too fast or is not regular keeps the heart from pumping well. In this procedure, sticky patches or metal paddles are placed on the chest to deliver electricity to the heart from a device. °This procedure may be done in an emergency if: °There is low or no blood pressure as a result of the heart rhythm. °Normal rhythm must be restored as fast as possible to protect the brain and heart from further damage. °It may save a life. °This may also be a scheduled procedure for irregular or fast heart rhythms that are not immediately life-threatening. ° °What can I expect after the procedure? °Your blood pressure, heart rate, breathing rate, and blood oxygen level will be monitored until you leave the hospital or clinic. °Your heart rhythm will be watched to make sure it does not change. °You may have some redness on the skin where the shocks were given. Over the counter cortizone cream may be helpful.  °Follow these instructions at home: °Do not drive for 24 hours if you were given a sedative during your procedure. °Take over-the-counter and prescription medicines only as told by your health care provider. °Ask your health care provider how to check your pulse. Check it often. °Rest for 48 hours after the procedure or as told by your health care provider. °Avoid or limit your caffeine use as told by your health care provider. °Keep all follow-up visits as told by your health care provider. This is important. °Contact a health care provider if: °You feel like your heart is beating too quickly or your pulse is not regular. °You have a serious muscle cramp that does not go away. °Get help right away if: °You have discomfort in your chest. °You are dizzy or you feel faint. °You have trouble breathing or you are short of breath. °Your speech is slurred. °You have trouble moving an  arm or leg on one side of your body. °Your fingers or toes turn cold or blue. °Summary °Electrical cardioversion is the delivery of a jolt of electricity to restore a normal rhythm to the heart. °This procedure may be done right away in an emergency or may be a scheduled procedure if the condition is not an emergency. °Generally, this is a safe procedure. °After the procedure, check your pulse often as told by your health care provider. °This information is not intended to replace advice given to you by your health care provider. Make sure you discuss any questions you have with your health care provider. °Document Revised: 05/21/2019 Document Reviewed: 05/21/2019 °Elsevier Patient Education © 2020 [employer] Inc[employer]  °

## 2023-08-24 NOTE — Telephone Encounter (Signed)
Patient would like to know if she needs to have a follow up after cardioversion with Dr. Mayford Knife today.

## 2023-08-24 NOTE — CV Procedure (Signed)
Electrical Cardioversion Procedure Note Brenda [name] [id number] 1951/06/10  Procedure: Electrical Cardioversion Indications:  Atrial Flutter  Time Out: Verified patient identification, verified procedure,medications/allergies/relevent history reviewed, required imaging and test results available.  Performed  Procedure Details  The patient was NPO after midnight. Anesthesia was administered at the beside  by [facility] with 50mg  of propofol and 60mg  of Lidocaine.  Cardioversion was done with synchronized biphasic defibrillation with AP pads with 200watts.  The patient converted to normal sinus rhythm. The patient tolerated the procedure well   IMPRESSION:  Successful cardioversion of atrial Flutter    Brenda [name] 08/24/2023, 9:18 AM

## 2023-08-24 NOTE — Anesthesia Procedure Notes (Signed)
Procedure Name: MAC Date/Time: 08/24/2023 9:15 AM  Performed by: April Holding, CRNAPre-anesthesia Checklist: Patient identified, Emergency Drugs available, Suction available and Patient being monitored Oxygen Delivery Method: Nasal cannula Induction Type: IV induction Placement Confirmation: positive ETCO2

## 2023-08-25 ENCOUNTER — Encounter (HOSPITAL_COMMUNITY): Payer: Self-pay | Admitting: Cardiology

## 2023-08-25 ENCOUNTER — Encounter: Payer: Self-pay | Admitting: Cardiovascular Disease

## 2023-08-25 NOTE — Telephone Encounter (Signed)
Call to patient to advise Dr. Mayford Knife recommends referral to afib clinic, patient endorses she is already scheduled for 09/08/23.

## 2023-08-25 NOTE — Addendum Note (Signed)
Addended by: Luellen Pucker on: 08/25/2023 12:06 PM   Modules accepted: Orders

## 2023-08-30 ENCOUNTER — Other Ambulatory Visit: Payer: Self-pay | Admitting: Cardiovascular Disease

## 2023-09-01 ENCOUNTER — Other Ambulatory Visit: Payer: Self-pay | Admitting: Cardiovascular Disease

## 2023-09-01 NOTE — Telephone Encounter (Signed)
last visit: 07/01/23 with plan to f/u in 6 months. Next visit: none/active recall

## 2023-09-07 ENCOUNTER — Other Ambulatory Visit: Payer: Self-pay | Admitting: Cardiovascular Disease

## 2023-09-08 ENCOUNTER — Ambulatory Visit (HOSPITAL_COMMUNITY)
Admission: RE | Admit: 2023-09-08 | Discharge: 2023-09-08 | Disposition: A | Discharge status: Home / Self Care | Payer: Medicare Other | Source: Ambulatory Visit | Attending: Physician Assistant | Admitting: Physician Assistant

## 2023-09-08 ENCOUNTER — Other Ambulatory Visit: Payer: Self-pay

## 2023-09-08 VITALS — BP 136/66 | HR 88 | Ht 60.0 in | Wt 166.4 lb

## 2023-09-08 DIAGNOSIS — I2510 Atherosclerotic heart disease of native coronary artery without angina pectoris: Secondary | ICD-10-CM | POA: Diagnosis not present

## 2023-09-08 DIAGNOSIS — D6869 Other thrombophilia: Secondary | ICD-10-CM | POA: Insufficient documentation

## 2023-09-08 DIAGNOSIS — Z79899 Other long term (current) drug therapy: Secondary | ICD-10-CM | POA: Insufficient documentation

## 2023-09-08 DIAGNOSIS — I129 Hypertensive chronic kidney disease with stage 1 through stage 4 chronic kidney disease, or unspecified chronic kidney disease: Secondary | ICD-10-CM | POA: Insufficient documentation

## 2023-09-08 DIAGNOSIS — E1122 Type 2 diabetes mellitus with diabetic chronic kidney disease: Secondary | ICD-10-CM | POA: Insufficient documentation

## 2023-09-08 DIAGNOSIS — I4819 Other persistent atrial fibrillation: Secondary | ICD-10-CM | POA: Diagnosis not present

## 2023-09-08 DIAGNOSIS — Z794 Long term (current) use of insulin: Secondary | ICD-10-CM | POA: Insufficient documentation

## 2023-09-08 DIAGNOSIS — Z7901 Long term (current) use of anticoagulants: Secondary | ICD-10-CM | POA: Insufficient documentation

## 2023-09-08 DIAGNOSIS — I4891 Unspecified atrial fibrillation: Secondary | ICD-10-CM

## 2023-09-08 DIAGNOSIS — N1832 Chronic kidney disease, stage 3b: Secondary | ICD-10-CM | POA: Diagnosis not present

## 2023-09-08 NOTE — Progress Notes (Addendum)
Primary Care Physician: Myrlene Broker, MD Primary Cardiologist: Dr. Mariah Milling Primary Electrophysiologist: Dr. Nelly Laurence Referring Physician: Dr Nelly Laurence    Brenda [name] is a 72 y.o. female with a history of CAD, DM2, CLL, HTN, CKD IIIb-IV, and persistent atrial fibrillation who presents for follow up in the [facility]. The patient was initially diagnosed with atrial fibrillation in May 2022 and placed on metoprolol and Eliquis. She was started on amiodarone in June 2023. September 2023 cardiac monitor for lightheadedness and dizziness showed 27% AF burden and multiple pauses with longest lasting 8 seconds. Her carvedilol and amiodarone were discontinued. Follow up monitor did not show any significant pauses. She was seen by Cardiology on 10/14/22 for palpitations having coincided 2 days prior with initiation of levothyroxine. She was noted to be in Afib with RVR. She was sent to the ED and underwent successful cardioversion. Seen by Dr. Nelly Laurence 10/18/22 and after discussion agreed to proceed with ablation. She is now s/p Afib ablation on 01/14/23. Patient is on Eliquis 5 mg BID for a CHADS2VASC score of 5.  In September 2023, she complained of lightheadedness and dizziness. A Zio patch was placed that showed sinus pauses up to about 8 seconds, so her Coreg and amiodarone were discontinued. Additionally, her TSH was elevated at 11.7. Pacemaker placement was deferred with a reversible cause. Monitor placed and follow-up did not show any atrial fibrillation or significant bradycardia. She did undergo DCCV on 06/19/23 in the ED and then again as an outpatient on 08/24/23. She is scheduled for repeat ablation on 10/11/23.  On follow up today, patient remains in SR. She has been frustrated with her afib over the last several months. No bleeding issues on anticoagulation.   Today, she denies symptoms of palpitations, chest pain, shortness of breath, orthopnea, PND, lower  extremity edema, dizziness, presyncope, syncope, snoring, daytime somnolence, bleeding, or neurologic sequela. The patient is tolerating medications without difficulties and is otherwise without complaint today.   Atrial Fibrillation Risk Factors:  she does not have a history of rheumatic fever. she does not have a history of alcohol use. The patient does not have a history of early familial atrial fibrillation or other arrhythmias.   Atrial Fibrillation Management history:  Previous antiarrhythmic drugs: amiodarone Previous cardioversions: 10/14/22, 06/19/23, 08/24/23 Previous ablations: 01/14/23 Anticoagulation history: Eliquis 5 mg BID   Past Medical History:  Diagnosis Date   Anxiety    Arthritis    Atrial fibrillation (HCC)    CLL (chronic lymphocytic leukemia) (HCC)    Dx. 2019 Dr. 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 Sebastian Ache first visit 11-2019     Current Outpatient Medications  Medication Sig Dispense Refill   acetaminophen (TYLENOL) 500 MG tablet Take 500-1,000 mg by mouth every 6 (six) hours as needed for mild pain, moderate pain or fever.      ALPRAZolam (XANAX) 1 MG tablet Take 1 tablet (1 mg total) by mouth 2 (two)  times daily as needed for anxiety. (Patient taking differently: Take 1 mg by mouth 2 (two) times daily.) 60 tablet 5   amoxicillin (AMOXIL) 500 MG capsule Take 2,000 mg by mouth See admin instructions. 1 hour before dental appointment     apixaban (ELIQUIS) 5 MG TABS tablet Take 1 tablet (5 mg total) by mouth 2 (two) times daily. 60 tablet 5   BD PEN NEEDLE NANO 2ND GEN 32G X 4 MM MISC USE DAILY AS DIRECTED 100 each 3   Blood Glucose Monitoring Suppl (ONETOUCH VERIO) w/Device KIT Use as advised 1 kit 0   buPROPion (WELLBUTRIN XL) 300 MG 24 hr tablet TAKE 1 TABLET(300 MG) BY MOUTH DAILY 90 tablet 3   Cholecalciferol (VITAMIN D3) 2000 units TABS Take 2,000 mcg by mouth daily.     Cyanocobalamin (B-12) 1000 MCG SUBL Place 1,000 mcg under the tongue every evening.      diltiazem (CARDIZEM) 60 MG tablet Take 1 tablet (60 mg total) by mouth 3 (three) times daily as needed. For afib     doxazosin (CARDURA) 2 MG tablet TAKE 1 TABLET(2 MG) BY MOUTH EVERY EVENING 90 tablet 0   esomeprazole (NEXIUM) 20 MG capsule Take 20 mg by mouth daily.     ezetimibe (ZETIA) 10 MG tablet TAKE 1 TABLET(10 MG) BY MOUTH DAILY 90 tablet 0   glucose blood (ONETOUCH VERIO) test strip USE TO TEST BLOOD GLUCOSE THREE TIMES DAILY. 300 strip 5   insulin glargine (LANTUS SOLOSTAR) 100 UNIT/ML Solostar Pen Inject under skin 16-22 units after dinner 30 mL 3   iron polysaccharides (NIFEREX) 150 MG capsule Take 150 mg by mouth daily.     JARDIANCE 10 MG TABS tablet TAKE 1 TABLET(10 MG) BY MOUTH DAILY BEFORE BREAKFAST 30 tablet 11   Lancet Devices (LANCING DEVICE) MISC Use as advised - Freestyle 1 each 0   Lancets (ONETOUCH DELICA PLUS LANCET30G) MISC TEST BLOOD SUGAR THREE TIMES DAILY AS DIRECTED 300 each 1   lisinopril (ZESTRIL) 20 MG tablet TAKE 1 TABLET(20 MG) BY MOUTH TWICE DAILY 180 tablet 0   NONFORMULARY OR COMPOUNDED ITEM Apply 1 application  topically 2 (two) times daily as needed (sun damage on face). FLUOROURACIL 5% +  CALCIPOTRIENE 0.005%     propranolol (INDERAL) 20 MG tablet Take 1 tablet (20 mg total) by mouth 3 (three) times daily as needed. Take 2 hours after diltiazem if afib continues 90 tablet 3   simvastatin (ZOCOR) 40 MG tablet TAKE 1 TABLET(40 MG) BY MOUTH AT BEDTIME 90 tablet 2   No current facility-administered medications for this encounter.    ROS- All systems are reviewed and negative except as per the HPI above.  Physical Exam: Vitals:   09/08/23 0854  BP: 136/66  Pulse: 88  Weight: 75.5 kg  Height: 5' (1.524 m)    GEN: Well nourished, well developed in no acute distress NECK: No JVD; No carotid bruits CARDIAC: Regular rate and rhythm, no murmurs, rubs, gallops RESPIRATORY:  Clear to auscultation without rales, wheezing or rhonchi  ABDOMEN: Soft,  non-tender, non-distended EXTREMITIES:  No edema; No deformity    Wt Readings from Last 3 Encounters:  09/08/23 75.5 kg  08/24/23 76.2 kg  08/19/23 76.2 kg    EKG today demonstrates  SR Vent. rate 88 BPM PR interval 186 ms QRS duration 82 ms QT/QTcB 354/428 ms  Echo 05/22/21 demonstrated:  1. Left ventricular ejection fraction, by estimation, is 60 to 65%. The  left ventricle has normal function. The left ventricle has no regional  wall motion abnormalities. Left ventricular diastolic parameters were  normal.   2. Right ventricular systolic function is normal. The right ventricular  size is normal.   3. The mitral valve is normal in structure. Mild mitral valve  regurgitation.  Epic records are reviewed at length today.  CHA2DS2-VASc Score = 5  The patient's score is based upon: CHF History: 0 HTN History: 1 Diabetes History: 1 Stroke History: 0 Vascular Disease History: 1 Age Score: 1 Gender Score: 1       ASSESSMENT AND PLAN: Persistent Atrial Fibrillation (ICD10:  I48.19) The patient's CHA2DS2-VASc score is 5, indicating a 7.2% annual risk of stroke.   S/p Afib ablation on 01/17/23. S/p DCCV  08/24/23 Patient appears to be maintaining SR Not felt to be a good candidate for most AAD due to renal function. Scheduled for repeat ablation 10/11/23. Continue Eliquis 5 mg BID Continue diltiazem 60 mg PRN TID for heart racing Continue propranolol 20 mg PRN TID for heart racing  Secondary Hypercoagulable State (ICD10:  D68.69) The patient is at significant risk for stroke/thromboembolism based upon her CHA2DS2-VASc Score of 5.  Continue Apixaban (Eliquis).   HTN Stable on current regimen   Follow up with Dr Nelly Laurence for ablation as scheduled.    Total time of encounter: 70 minutes total time of encounter, including chart review, face-to-face patient care, coordination of care and counseling regarding high complexity medical decision making.   Jorja Loa PA-C [facility] [address] [place], [place] [address] [phone] 09/08/2023 9:18 AM

## 2023-09-12 ENCOUNTER — Other Ambulatory Visit: Payer: Self-pay | Admitting: Cardiovascular Disease

## 2023-09-12 DIAGNOSIS — I4819 Other persistent atrial fibrillation: Secondary | ICD-10-CM

## 2023-09-13 NOTE — Telephone Encounter (Signed)
Refill Request.  

## 2023-09-13 NOTE — Telephone Encounter (Signed)
Prescription refill request for Eliquis received. Indication: AF Last office visit: 09/08/23  C Fenton PA Scr: 1.70 on 08/24/23  Epic Age: 72 Weight: 75.5kg  Based on above findings Eliquis 5mg  twice daily is the appropriate dose.  Refill approved.

## 2023-09-15 ENCOUNTER — Telehealth (HOSPITAL_COMMUNITY): Payer: Self-pay | Admitting: *Deleted

## 2023-09-15 NOTE — Telephone Encounter (Signed)
1015- Patient called in stating she woke up in afib this morning. Her blood sugar had dropped when she got up and felt weak related to this. BP before her morning meds was 120/80. After meds 62/40 HR 112. Pt states she feels ok and her BP cuff errored multiple times prior to giving this reading. Pt will increase water intake/eat salty snack and call back in 1 hour with response.  1120 - pt BP 93/59 HR 94 feeling improved just tired. She doesn't feel her HR is as irregular as earlier this morning. Discussed with Jorja Loa PA  - recommends holding lisinopril when in afib to give room in BP for PRN medications for rate control when needed. If remains persistent she will call back to be scheduled for cardioversion. Pt in agreement. ER precautions were reviewed with patient.

## 2023-09-16 NOTE — Telephone Encounter (Signed)
Patient called with update -- continues in AF. HRs 110-120 BP 105/68 with holding lisinopril. Pt verbalized understanding of how to use her PRN medications for rate control. Pt states if she can manage with rate control symptom wise until ablation she will try to method as unsure of success of repeat cardioversion. If becomes more symptomatic she will call back.

## 2023-09-19 DIAGNOSIS — I4891 Unspecified atrial fibrillation: Secondary | ICD-10-CM | POA: Diagnosis not present

## 2023-09-19 LAB — BASIC METABOLIC PANEL
BUN/Creatinine Ratio: 19 (ref 12–28)
BUN: 33 mg/dL — ABNORMAL HIGH (ref 8–27)
CO2: 23 mmol/L (ref 20–29)
Calcium: 9.5 mg/dL (ref 8.7–10.3)
Chloride: 102 mmol/L (ref 96–106)
Creatinine, Ser: 1.75 mg/dL — ABNORMAL HIGH (ref 0.57–1.00)
Glucose: 101 mg/dL — ABNORMAL HIGH (ref 70–99)
Potassium: 5.2 mmol/L (ref 3.5–5.2)
Sodium: 141 mmol/L (ref 134–144)
eGFR: 30 mL/min/{1.73_m2} — ABNORMAL LOW (ref >59)

## 2023-09-20 LAB — CBC
Hematocrit: 42.7 % (ref 34.0–46.6)
Hemoglobin: 13.4 g/dL (ref 11.1–15.9)
MCH: 28.9 pg (ref 26.6–33.0)
MCHC: 31.4 g/dL — ABNORMAL LOW (ref 31.5–35.7)
MCV: 92 fL (ref 79–97)
Platelets: 161 10*3/uL (ref 150–450)
RBC: 4.63 x10E6/uL (ref 3.77–5.28)
RDW: 13.3 % (ref 11.7–15.4)
WBC: 28.5 10*3/uL (ref 3.4–10.8)

## 2023-09-21 ENCOUNTER — Encounter: Payer: Self-pay | Admitting: Internal Medicine

## 2023-09-23 ENCOUNTER — Telehealth: Payer: Self-pay

## 2023-09-23 NOTE — Telephone Encounter (Signed)
Sample  Medication:Ozempic  Dose: 0.25/0.5 Quantity:2 boxes  WUJ:[id number] EXP:12/29/24  Dicie Beam

## 2023-10-10 NOTE — Anesthesia Preprocedure Evaluation (Signed)
Anesthesia Evaluation  Patient identified by MRN, date of birth, ID band Patient awake    Reviewed: Allergy & Precautions, NPO status , Patient's Chart, lab work & pertinent test results, reviewed documented beta blocker date and time   History of Anesthesia Complications (+) PONV and history of anesthetic complications (only after hysterectomy)  Airway Mallampati: II  TM Distance: >3 FB Neck ROM: Full    Dental  (+) Missing,    Pulmonary neg pulmonary ROS   Pulmonary exam normal        Cardiovascular hypertension, Pt. on medications and Pt. on home beta blockers Normal cardiovascular exam+ dysrhythmias (eliquis) Atrial Fibrillation + Valvular Problems/Murmurs (mild MR) MR   Echo 2022  1. Left ventricular ejection fraction, by estimation, is 60 to 65%. The  left ventricle has normal function. The left ventricle has no regional  wall motion abnormalities. Left ventricular diastolic parameters were  normal.   2. Right ventricular systolic function is normal. The right ventricular  size is normal.   3. The mitral valve is normal in structure. Mild mitral valve  regurgitation.      Neuro/Psych  PSYCHIATRIC DISORDERS Anxiety Depression    negative neurological ROS     GI/Hepatic Neg liver ROS, hiatal hernia,GERD  ,,  Endo/Other  diabetes, Well Controlled, Type 2, Insulin Dependent  Obesity BMI 33  Renal/GU negative Renal ROS  negative genitourinary   Musculoskeletal  (+) Arthritis , Osteoarthritis,    Abdominal   Peds  Hematology negative hematology ROS (+)   Anesthesia Other Findings   Reproductive/Obstetrics negative OB ROS                              Anesthesia Physical Anesthesia Plan  ASA: 3  Anesthesia Plan: General   Post-op Pain Management:    Induction: Intravenous  PONV Risk Score and Plan: 4 or greater and Ondansetron, Dexamethasone and Treatment may vary due to age  or medical condition  Airway Management Planned: Oral ETT  Additional Equipment: None  Intra-op Plan:   Post-operative Plan: Extubation in OR  Informed Consent: I have reviewed the patients History and Physical, chart, labs and discussed the procedure including the risks, benefits and alternatives for the proposed anesthesia with the patient or authorized representative who has indicated his/her understanding and acceptance.     Dental advisory given  Plan Discussed with: CRNA  Anesthesia Plan Comments:          Anesthesia Quick Evaluation

## 2023-10-10 NOTE — Pre-Procedure Instructions (Signed)
Instructed patient on the following items: Arrival time 0530 Nothing to eat or drink after midnight No meds AM of procedure Responsible person to drive you home and stay with you for 24 hrs  Have you missed any doses of anti-coagulant Eliquis- takes twice a day, hasn't missed any doses.

## 2023-10-11 ENCOUNTER — Other Ambulatory Visit: Payer: Self-pay

## 2023-10-11 ENCOUNTER — Ambulatory Visit (HOSPITAL_BASED_OUTPATIENT_CLINIC_OR_DEPARTMENT_OTHER): Payer: Self-pay | Admitting: Anesthesiology

## 2023-10-11 ENCOUNTER — Ambulatory Visit (HOSPITAL_COMMUNITY): Payer: Self-pay | Admitting: Anesthesiology

## 2023-10-11 ENCOUNTER — Ambulatory Visit (HOSPITAL_COMMUNITY)
Admission: RE | Admit: 2023-10-11 | Discharge: 2023-10-11 | Disposition: A | Discharge status: Home / Self Care | Payer: Medicare Other | Attending: Cardiovascular Disease | Admitting: Cardiovascular Disease

## 2023-10-11 ENCOUNTER — Ambulatory Visit (HOSPITAL_COMMUNITY): Payer: Medicare Other

## 2023-10-11 ENCOUNTER — Ambulatory Visit (HOSPITAL_COMMUNITY)
Admission: RE | Disposition: A | Discharge status: Home / Self Care | Payer: Self-pay | Source: Home / Self Care | Attending: Cardiovascular Disease

## 2023-10-11 ENCOUNTER — Encounter (HOSPITAL_COMMUNITY): Payer: Self-pay | Admitting: Cardiovascular Disease

## 2023-10-11 DIAGNOSIS — E1122 Type 2 diabetes mellitus with diabetic chronic kidney disease: Secondary | ICD-10-CM | POA: Diagnosis not present

## 2023-10-11 DIAGNOSIS — N184 Chronic kidney disease, stage 4 (severe): Secondary | ICD-10-CM | POA: Insufficient documentation

## 2023-10-11 DIAGNOSIS — I3139 Other pericardial effusion (noninflammatory): Secondary | ICD-10-CM | POA: Diagnosis not present

## 2023-10-11 DIAGNOSIS — Z6831 Body mass index (BMI) 31.0-31.9, adult: Secondary | ICD-10-CM | POA: Diagnosis not present

## 2023-10-11 DIAGNOSIS — Z7901 Long term (current) use of anticoagulants: Secondary | ICD-10-CM | POA: Diagnosis not present

## 2023-10-11 DIAGNOSIS — F418 Other specified anxiety disorders: Secondary | ICD-10-CM | POA: Diagnosis not present

## 2023-10-11 DIAGNOSIS — I1310 Hypertensive heart and chronic kidney disease without heart failure, with stage 1 through stage 4 chronic kidney disease, or unspecified chronic kidney disease: Secondary | ICD-10-CM | POA: Diagnosis not present

## 2023-10-11 DIAGNOSIS — E669 Obesity, unspecified: Secondary | ICD-10-CM | POA: Insufficient documentation

## 2023-10-11 DIAGNOSIS — I4891 Unspecified atrial fibrillation: Secondary | ICD-10-CM

## 2023-10-11 DIAGNOSIS — I484 Atypical atrial flutter: Secondary | ICD-10-CM | POA: Diagnosis not present

## 2023-10-11 DIAGNOSIS — I483 Typical atrial flutter: Secondary | ICD-10-CM | POA: Diagnosis not present

## 2023-10-11 DIAGNOSIS — Z794 Long term (current) use of insulin: Secondary | ICD-10-CM | POA: Insufficient documentation

## 2023-10-11 DIAGNOSIS — I4819 Other persistent atrial fibrillation: Secondary | ICD-10-CM | POA: Diagnosis not present

## 2023-10-11 DIAGNOSIS — E119 Type 2 diabetes mellitus without complications: Secondary | ICD-10-CM | POA: Diagnosis not present

## 2023-10-11 DIAGNOSIS — D6869 Other thrombophilia: Secondary | ICD-10-CM | POA: Insufficient documentation

## 2023-10-11 HISTORY — PX: TRANSESOPHAGEAL ECHOCARDIOGRAM (CATH LAB): EP1270

## 2023-10-11 HISTORY — PX: ATRIAL FIBRILLATION ABLATION: EP1191

## 2023-10-11 LAB — GLUCOSE, CAPILLARY
Glucose-Capillary: 119 mg/dL — ABNORMAL HIGH (ref 70–99)
Glucose-Capillary: 68 mg/dL — ABNORMAL LOW (ref 70–99)
Glucose-Capillary: 77 mg/dL (ref 70–99)
Glucose-Capillary: 92 mg/dL (ref 70–99)
Glucose-Capillary: 102 mg/dL — ABNORMAL HIGH (ref 70–99)
Glucose-Capillary: 106 mg/dL — ABNORMAL HIGH (ref 70–99)

## 2023-10-11 LAB — ECHO TEE
Height: 60 in
Weight: 2544 [oz_av]

## 2023-10-11 LAB — POCT ACTIVATED CLOTTING TIME
Activated Clotting Time: 297 s
Activated Clotting Time: 291 s
Activated Clotting Time: 314 s

## 2023-10-11 SURGERY — ATRIAL FIBRILLATION ABLATION
Anesthesia: General

## 2023-10-11 MED ORDER — ONDANSETRON HCL 4 MG/2ML IJ SOLN
4.0000 mg | Freq: Four times a day (QID) | INTRAMUSCULAR | Status: DC | PRN
Start: 2023-10-11 — End: 2023-10-11

## 2023-10-11 MED ORDER — SUGAMMADEX SODIUM 200 MG/2ML IV SOLN
INTRAVENOUS | Status: DC | PRN
  Administered 2023-10-11: 200 mg via INTRAVENOUS

## 2023-10-11 MED ORDER — SODIUM CHLORIDE 0.9% FLUSH: 3.0000 mL | INTRAVENOUS | Status: DC | PRN

## 2023-10-11 MED ORDER — ATROPINE SULFATE 1 MG/ML IV SOLN
INTRAVENOUS | Status: DC | PRN
  Administered 2023-10-11: 1 mg via INTRAVENOUS

## 2023-10-11 MED ORDER — FENTANYL CITRATE (PF) 250 MCG/5ML IJ SOLN
INTRAMUSCULAR | Status: DC | PRN
  Administered 2023-10-11 (×2): 50 ug via INTRAVENOUS

## 2023-10-11 MED ORDER — PROTAMINE SULFATE 10 MG/ML IV SOLN
INTRAVENOUS | Status: DC | PRN
  Administered 2023-10-11: 10 mg via INTRAVENOUS
  Administered 2023-10-11: 30 mg via INTRAVENOUS

## 2023-10-11 MED ORDER — DEXAMETHASONE SODIUM PHOSPHATE 10 MG/ML IJ SOLN
INTRAMUSCULAR | Status: DC | PRN
  Administered 2023-10-11: 5 mg via INTRAVENOUS

## 2023-10-11 MED ORDER — ROCURONIUM BROMIDE 10 MG/ML (PF) SYRINGE
PREFILLED_SYRINGE | INTRAVENOUS | Status: DC | PRN
  Administered 2023-10-11 (×2): 10 mg via INTRAVENOUS
  Administered 2023-10-11: 60 mg via INTRAVENOUS
  Administered 2023-10-11 (×2): 10 mg via INTRAVENOUS

## 2023-10-11 MED ORDER — DEXTROSE 50 % IV SOLN
INTRAVENOUS | Status: AC
  Filled 2023-10-11: qty 50

## 2023-10-11 MED ORDER — HEPARIN (PORCINE) IN NACL 1000-0.9 UT/500ML-% IV SOLN
INTRAVENOUS | Status: DC | PRN
  Administered 2023-10-11 (×3): 500 mL

## 2023-10-11 MED ORDER — PROPOFOL 10 MG/ML IV BOLUS
INTRAVENOUS | Status: DC | PRN
  Administered 2023-10-11: 130 mg via INTRAVENOUS

## 2023-10-11 MED ORDER — LIDOCAINE 2% (20 MG/ML) 5 ML SYRINGE
INTRAMUSCULAR | Status: DC | PRN
  Administered 2023-10-11: 100 mg via INTRAVENOUS

## 2023-10-11 MED ORDER — ONDANSETRON HCL 4 MG/2ML IJ SOLN
INTRAMUSCULAR | Status: DC | PRN
  Administered 2023-10-11: 4 mg via INTRAVENOUS

## 2023-10-11 MED ORDER — CEFAZOLIN SODIUM-DEXTROSE 2-4 GM/100ML-% IV SOLN
INTRAVENOUS | Status: AC
  Filled 2023-10-11: qty 100

## 2023-10-11 MED ORDER — HEPARIN SODIUM (PORCINE) 1000 UNIT/ML IJ SOLN
INTRAMUSCULAR | Status: DC | PRN
  Administered 2023-10-11: 14000 [IU] via INTRAVENOUS
  Administered 2023-10-11: 2000 [IU] via INTRAVENOUS
  Administered 2023-10-11: 3000 [IU] via INTRAVENOUS

## 2023-10-11 MED ORDER — CEFAZOLIN SODIUM-DEXTROSE 2-3 GM-%(50ML) IV SOLR
INTRAVENOUS | Status: DC | PRN
  Administered 2023-10-11: 2 g via INTRAVENOUS

## 2023-10-11 MED ORDER — PHENYLEPHRINE HCL-NACL 20-0.9 MG/250ML-% IV SOLN
INTRAVENOUS | Status: DC | PRN
  Administered 2023-10-11: 50 ug/min via INTRAVENOUS

## 2023-10-11 MED ORDER — METOPROLOL SUCCINATE ER 25 MG PO TB24: 25.0000 mg | ORAL_TABLET | Freq: Every day | ORAL | 11 refills | Status: DC

## 2023-10-11 MED ORDER — SODIUM CHLORIDE 0.9 % IV SOLN: 250.0000 mL | INTRAVENOUS | Status: DC | PRN

## 2023-10-11 MED ORDER — SODIUM CHLORIDE 0.9 % IV SOLN: INTRAVENOUS | Status: DC

## 2023-10-11 MED ORDER — ACETAMINOPHEN 325 MG PO TABS: 650.0000 mg | ORAL_TABLET | ORAL | Status: DC | PRN

## 2023-10-11 MED ORDER — FENTANYL CITRATE (PF) 100 MCG/2ML IJ SOLN
INTRAMUSCULAR | Status: AC
  Filled 2023-10-11: qty 2

## 2023-10-11 MED ORDER — DEXTROSE 50 % IV SOLN
12.5000 g | INTRAVENOUS | Status: AC
  Administered 2023-10-11: 12.5 g via INTRAVENOUS

## 2023-10-11 SURGICAL SUPPLY — 21 items
BAG SNAP BAND KOVER 36X36 (MISCELLANEOUS) IMPLANT
CABLE PFA RX CATH CONN (CABLE) IMPLANT
CATH EZ STEER NAV 8MM D-F CUR (ABLATOR) IMPLANT
CATH FARAWAVE ABLATION 31 (CATHETERS) IMPLANT
CATH GE 8FR SOUNDSTAR (CATHETERS) IMPLANT
CATH OCTARAY 2.0 F 3-3-3-3-3 (CATHETERS) IMPLANT
CATH WEB BI DIR CSDF CRV REPRO (CATHETERS) IMPLANT
CLOSURE PERCLOSE PROSTYLE (VASCULAR PRODUCTS) IMPLANT
COVER SWIFTLINK CONNECTOR (BAG) ×1 IMPLANT
DEVICE CLOSURE MYNXGRIP 6/7F (Vascular Products) IMPLANT
DILATOR VESSEL 38 20CM 16FR (INTRODUCER) IMPLANT
GUIDEWIRE INQWIRE 1.5J.035X260 (WIRE) IMPLANT
INQWIRE 1.5J .035X260CM (WIRE) ×1
KIT VERSACROSS CNCT FARADRIVE (KITS) IMPLANT
PACK EP LF (CUSTOM PROCEDURE TRAY) ×1 IMPLANT
PAD DEFIB RADIO PHYSIO CONN (PAD) ×1 IMPLANT
PATCH CARTO3 (PAD) IMPLANT
SHEATH FARADRIVE STEERABLE (SHEATH) IMPLANT
SHEATH PINNACLE 8F 10CM (SHEATH) IMPLANT
SHEATH PINNACLE 9F 10CM (SHEATH) IMPLANT
SHEATH PROBE COVER 6X72 (BAG) IMPLANT

## 2023-10-11 NOTE — CV Procedure (Signed)
    TRANSESOPHAGEAL ECHOCARDIOGRAM   NAME:  Brenda [name]   MRN: [id number] DOB:  02-02-1951   ADMIT DATE: 10/11/2023  INDICATIONS:   PROCEDURE:   Informed consent was obtained prior to the procedure. The risks, benefits and alternatives for the procedure were discussed and the patient comprehended these risks.  Risks include, but are not limited to, cough, sore throat, vomiting, nausea, somnolence, esophageal and stomach trauma or perforation, bleeding, low blood pressure, aspiration, pneumonia, infection, trauma to the teeth and death.    After a procedural time-out, the patient was sedated by the anesthesia service. Once an appropriate level of sedation was achieved, the transesophageal probe was inserted in the esophagus and stomach without difficulty and multiple views were obtained.    COMPLICATIONS:    There were no immediate complications.  FINDINGS:  LEFT VENTRICLE: EF = 40-45%  RIGHT VENTRICLE: Mildly reduced   LEFT ATRIUM: Moderately dilated  LEFT ATRIAL APPENDAGE: No thrombus.   RIGHT ATRIUM: Normal  AORTIC VALVE:  Trileaflet. Mildly calcified. Trivial AI  MITRAL VALVE:    Normal. Trivial MR  TRICUSPID VALVE: Normal. Trivial TR  PULMONIC VALVE: Grossly normal. Trivial PR  INTERATRIAL SEPTUM: Tiny PFO  PERICARDIUM: Small anterior effusion   DESCENDING AORTA: Moderate to severe plaque    [name],MD 8:29 AM

## 2023-10-11 NOTE — Transfer of Care (Signed)
Immediate Anesthesia Transfer of Care Note  Patient: Brenda [name]  Procedure(s) Performed: ATRIAL FIBRILLATION ABLATION TRANSESOPHAGEAL ECHOCARDIOGRAM  Patient Location: Cath Lab  Anesthesia Type:General  Level of Consciousness: drowsy and responds to stimulation  Airway & Oxygen Therapy: Patient Spontanous Breathing and Patient connected to nasal cannula oxygen  Post-op Assessment: Report given to RN and Post -op Vital signs reviewed and stable  Post vital signs: Reviewed and stable  Last Vitals:  Vitals Value Taken Time  BP 98/49 10/11/23 1215  Temp    Pulse 65 10/11/23 1216  Resp 17 10/11/23 1216  SpO2 94 % 10/11/23 1216  Vitals shown include unfiled device data.  Last Pain:  Vitals:   10/11/23 0610  TempSrc: Oral  PainSc:       Patients Stated Pain Goal: 5 (10/11/23 1610)  Complications: There were no known notable events for this encounter.

## 2023-10-11 NOTE — Interval H&P Note (Signed)
History and Physical Interval Note:  10/11/2023 7:19 AM  Brenda [name]  has presented today for surgery, with the diagnosis of afib.  The various methods of treatment have been discussed with the patient and family. After consideration of risks, benefits and other options for treatment, the patient has consented to  Procedure(s): ATRIAL FIBRILLATION ABLATION (N/A) TRANSESOPHAGEAL ECHOCARDIOGRAM (N/A) as a surgical intervention.  The patient's history has been reviewed, patient examined, no change in status, stable for surgery.  I have reviewed the patient's chart and labs.  Questions were answered to the patient's satisfaction.     Cap Massi

## 2023-10-11 NOTE — H&P (Addendum)
Cardiology Office Note:    Date:  10/11/2023   ID:  Maisyn, Aaronson Sep 04, 1951, MRN [id number]  PCP:  Myrlene Broker, MD   [facility] Providers Cardiologist:  Julien Nordmann, MD Electrophysiologist:  Maurice Small, MD     Referring MD: No ref. provider found    History of Present Illness:    Brenda [name] is a 72 y.o. female with a hx listed below pertinent for CAD, DM, HTN, persistent AF, CKD IIIb-IV referred for arrhythmia management.  She was diagnosed with atrial fibrillation in May 2022 and placed on metoprolol and eliquis.  She was started on amiodarone in June 2023.  In September 2023, she complained of lightheadedness and dizziness.  A Zio patch was placed that showed sinus pauses up to about 8 seconds, so her Coreg and amiodarone were discontinued.  Additionally, her TSH was elevated at 11.7.  Pacemaker placement was deferred with a reversible cause.  Monitor placed and follow-up did not show any atrial fibrillation or significant bradycardia.  She has had sinus bradycardia with metoprolol years ago.  She reports that she has been doing very well since wearing the monitor.  She records her heart rate 4 times a day, and these have been running between 70 and 90 bpm.  She notes that when she did have atrial fibrillation, her primary symptom was fatigue.  She did not have significant palpitations.  She could appreciate a regular rhythm during pulse checks.  She is a retired Engineer, civil (consulting).  She underwent atrial fibrillation ablation on January 14, 2023.    She did well for some time after her ablation; he had a few recurrences of atrial fibrillation and August 2024 and eventually underwent cardioversion in the ER.  She was not adequately sedated and felt the shock.   I reviewed the patient's labs. she  has not missed any doses of anticoagulation, and she took her dose last night. There have been no changes in the patient's diagnoses, medications, or  condition since our recent clinic visit.     EKGs/Labs/Other Studies Reviewed:           Recent Labs: 10/14/2022: Magnesium 2.0 06/19/2023: B Natriuretic Peptide 362.3 07/08/2023: ALT 9 08/18/2023: TSH 3.40 09/19/2023: BUN 33; Creatinine, Ser 1.75; Hemoglobin 13.4; Platelets 161; Potassium 5.2; Sodium 141       Physical Exam:    VS:  BP 131/85   Pulse (!) 130 Comment: RN made aware  Temp 97.6 F (36.4 C) (Oral)   Resp 18   Ht 5' (1.524 m)   Wt 72.1 kg   SpO2 96%   BMI 31.05 kg/m     Wt Readings from Last 3 Encounters:  10/11/23 72.1 kg  09/08/23 75.5 kg  08/24/23 76.2 kg     GEN:  Well nourished, well developed in no acute distress CARDIAC: RRR, no murmurs, rubs, gallops RESPIRATORY:  Normal work of breathing MUSCULOSKELETAL: no edema    ASSESSMENT & PLAN:    Paroxysmal atrial fibrillation:  she notes fatigue when in AF.  not a candidate for most AADs due to renal dysfunction.  Now having persistent atrial fibrillation We discussed the indication and rationale for repeat ablation to maintain sinus rhythm If she has recurrence, we will plan for pacemaker and AVJ ablation since her rates are poorly controlled TEE today   We discussed the indication, rationale, logistics, anticipated benefits, and potential risks of the ablation procedure including but not limited to -- bleed at the groin  access site, chest pain, damage to nearby organs, need for a drainage tube, or prolonged hospitalization. I explained that the risk for stroke, heart attack, need for open chest surgery, or even death is very low but not zero. she  expressed understanding and wishes to proceed.   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          Medication Adjustments/Labs and Tests Ordered: Current medicines are reviewed at length with the patient today.  Concerns regarding  medicines are outlined above.  Orders Placed This Encounter  Procedures   Glucose, capillary   Glucose, capillary   Informed Consent Details: Physician/Practitioner Attestation; Transcribe to consent form and obtain patient signature   Initiate Pre-op Protocol   Void on call to EP Lab   Confirm CBC and BMP (or CMP) results within 7 days for inpatient and 30 days for outpatient:   Clip right and left femoral area PM before surgery   Clip right internal jugular area PM before surgery   Pre-admission testing diagnosis   Informed Consent Details: Physician/Practitioner Attestation; Transcribe to consent form and obtain patient signature   EP STUDY   ECHO TEE   Insert peripheral IV   Meds ordered this encounter  Medications   0.9 %  sodium chloride infusion   dextrose 50 % solution 12.5 g    Order Specific Question:   Indication    Answer:   Hypoglycemia orders for diabetic adults and all adult patients on insulin or other diabetes medications.    Order Specific Question:   Documentation    Answer:   Document in progress notes using SmartText for hypoglycemia.   dextrose 50 % solution    Ray Church S: cabinet override     Signed, Maurice Small, MD  10/11/2023 7:15 AM    [facility]

## 2023-10-11 NOTE — Anesthesia Postprocedure Evaluation (Signed)
Anesthesia Post Note  Patient: Brenda [name]  Procedure(s) Performed: ATRIAL FIBRILLATION ABLATION TRANSESOPHAGEAL ECHOCARDIOGRAM     Patient location during evaluation: PACU Anesthesia Type: General Level of consciousness: awake and alert, oriented and patient cooperative Pain management: pain level controlled Vital Signs Assessment: post-procedure vital signs reviewed and stable Respiratory status: spontaneous breathing, nonlabored ventilation and respiratory function stable Cardiovascular status: blood pressure returned to baseline and stable Postop Assessment: no apparent nausea or vomiting Anesthetic complications: no   There were no known notable events for this encounter.  Last Vitals:  Vitals:   10/11/23 1245 10/11/23 1250  BP:  (!) 105/54  Pulse: 64 66  Resp: 19 17  Temp:  36.7 C  SpO2: 95% 97%    Last Pain:  Vitals:   10/11/23 1250  TempSrc: Temporal  PainSc:                  Lannie Fields

## 2023-10-11 NOTE — Progress Notes (Signed)
Echocardiogram Echocardiogram Transesophageal has been performed.  Lucendia Herrlich 10/11/2023, 8:40 AM

## 2023-10-11 NOTE — Discharge Instructions (Signed)

## 2023-10-11 NOTE — Anesthesia Procedure Notes (Signed)
Procedure Name: Intubation Date/Time: 10/11/2023 8:58 AM  Performed by: Loleta Jackie Russman, CRNAPre-anesthesia Checklist: Patient identified, Patient being monitored, Timeout performed, Emergency Drugs available and Suction available Patient Re-evaluated:Patient Re-evaluated prior to induction Oxygen Delivery Method: Circle system utilized Preoxygenation: Pre-oxygenation with 100% oxygen Induction Type: IV induction Ventilation: Mask ventilation without difficulty Laryngoscope Size: Mac and 4 Grade View: Grade I Tube type: Oral Tube size: 7.0 mm Number of attempts: 1 Airway Equipment and Method: Stylet Placement Confirmation: ETT inserted through vocal cords under direct vision, positive ETCO2 and breath sounds checked- equal and bilateral Secured at: 20 cm Tube secured with: Tape Dental Injury: Teeth and Oropharynx as per pre-operative assessment

## 2023-10-11 NOTE — Progress Notes (Signed)
Patient and friend was given discharge instructions. Both verbalized understanding. 

## 2023-10-12 ENCOUNTER — Telehealth (HOSPITAL_COMMUNITY): Payer: Self-pay | Admitting: *Deleted

## 2023-10-12 MED FILL — Cefazolin Sodium-Dextrose IV Solution 2 GM/100ML-4%: INTRAVENOUS | Qty: 100 | Status: AC

## 2023-10-12 MED FILL — Fentanyl Citrate Preservative Free (PF) Inj 100 MCG/2ML: INTRAMUSCULAR | Qty: 2 | Status: AC

## 2023-10-12 NOTE — Telephone Encounter (Addendum)
Patient called in stating she is experiencing lower than normal BP since her ablation yesterday. She does not feel bad but BP running in the upper 80/90s. HR in the 70s NSR. Pt held lisinopril and metoprolol yesterday. She was NPO for an extended period yesterday due to ablation procedure length. Today she is attempting to push oral fluids she will continue holding lisinopril until BP normalizes. She will attempt to take at least 1/2 tablet of metoprolol daily if SBP >100. Pt to eat a salty snack now to see if this will help with BP as well.  Pt denies fever. Pt will call if Bp does not normalize over the next 24 hours. ER precautions reviewed should BP drop and becomes symptomatic.

## 2023-10-13 ENCOUNTER — Telehealth: Payer: Self-pay

## 2023-10-13 NOTE — Telephone Encounter (Signed)
I would suggest to continue to regimen the way we had it before, especially if she is eating normally now and the stress of the procedure is gone.  If her sugars remain elevated after she takes the full regimen, please let us know, as we may give her a sample of Ozempic so she can start sooner.

## 2023-10-13 NOTE — Telephone Encounter (Signed)
Patient called and left a VM stated she has been under a lot of stress and had another cardiac ablation. She had to hold her Jardiance x 3 days and the day before she could only take 1/2 the dose of her insulin. Her readings were 223,230, and 260 but today it has come down to 80 (checked right before phone call)   She is now concerned how it going to low but also afraid it will go back up.   She has not started Ozempic due to insurance. Will start new plan in January.

## 2023-10-13 NOTE — Telephone Encounter (Signed)
Patient called in this AM stating her BP is better 112/70 HR 70-80. Pt does report shortness of breath/cough feels she is having this due to irritation from TEE/intubation. Denies fever. Able to tolerate soft foods and fluids without difficulty. Pt states she did call EMS overnight b/c of the shortness of breath but her vitals were all very good so they did not feel she needed to be transported to ER. Pt wonders if she possibly having anxiety related to post op recovery. Pt denies weight gain/swelling. Reports some abdominal bloating but pt is convinced this is related to drinking fluids yesterday with a straw/excessive gas. Inquired if pt feels as though she is retaining fluid but pt denies stating bloating is better today. Pt was better by the end of our 15 minute call and will call if she does not feel her symptoms are improving. If develops fever, colored sputum, worsening cough she should contact her PCP. ER precautions were also reviewed with patient.

## 2023-10-14 NOTE — Telephone Encounter (Signed)
Patient states her blood sugar has been going good and she is going to hold off on Ozempic. She has not been eating much either since the procedure.

## 2023-10-15 ENCOUNTER — Other Ambulatory Visit: Payer: Self-pay | Admitting: Internal Medicine

## 2023-10-18 ENCOUNTER — Encounter: Payer: Self-pay | Admitting: Internal Medicine

## 2023-10-18 MED ORDER — ALPRAZOLAM 1 MG PO TABS: 1.0000 mg | ORAL_TABLET | Freq: Two times a day (BID) | ORAL | 5 refills | Status: DC | PRN

## 2023-10-23 ENCOUNTER — Encounter: Payer: Self-pay | Admitting: Internal Medicine

## 2023-10-23 MED ORDER — BD PEN NEEDLE NANO 2ND GEN 32G X 4 MM MISC: 3 refills | Status: AC

## 2023-10-24 ENCOUNTER — Encounter: Payer: Self-pay | Admitting: Internal Medicine

## 2023-10-24 NOTE — Telephone Encounter (Signed)
 Care team updated and letter sent for eye exam notes.

## 2023-10-30 ENCOUNTER — Other Ambulatory Visit: Payer: Self-pay | Admitting: Internal Medicine

## 2023-11-04 NOTE — Telephone Encounter (Signed)
 Sample has been put back, since not picked up (09/2023)  [id number] EXP: 12/29/24

## 2023-11-08 ENCOUNTER — Encounter (HOSPITAL_COMMUNITY): Payer: Self-pay | Admitting: Physician Assistant

## 2023-11-08 ENCOUNTER — Ambulatory Visit (HOSPITAL_COMMUNITY)
Admit: 2023-11-08 | Discharge: 2023-11-08 | Disposition: A | Discharge status: Home / Self Care | Payer: Medicare Other | Source: Ambulatory Visit | Attending: Physician Assistant | Admitting: Physician Assistant

## 2023-11-08 VITALS — BP 116/66 | HR 62 | Ht 60.0 in | Wt 168.6 lb

## 2023-11-08 DIAGNOSIS — I2510 Atherosclerotic heart disease of native coronary artery without angina pectoris: Secondary | ICD-10-CM | POA: Diagnosis not present

## 2023-11-08 DIAGNOSIS — I129 Hypertensive chronic kidney disease with stage 1 through stage 4 chronic kidney disease, or unspecified chronic kidney disease: Secondary | ICD-10-CM | POA: Diagnosis not present

## 2023-11-08 DIAGNOSIS — I4819 Other persistent atrial fibrillation: Secondary | ICD-10-CM | POA: Insufficient documentation

## 2023-11-08 DIAGNOSIS — I4892 Unspecified atrial flutter: Secondary | ICD-10-CM | POA: Insufficient documentation

## 2023-11-08 DIAGNOSIS — C9110 Chronic lymphocytic leukemia of B-cell type not having achieved remission: Secondary | ICD-10-CM | POA: Diagnosis not present

## 2023-11-08 DIAGNOSIS — Z794 Long term (current) use of insulin: Secondary | ICD-10-CM | POA: Diagnosis not present

## 2023-11-08 DIAGNOSIS — E1122 Type 2 diabetes mellitus with diabetic chronic kidney disease: Secondary | ICD-10-CM | POA: Insufficient documentation

## 2023-11-08 DIAGNOSIS — Z7901 Long term (current) use of anticoagulants: Secondary | ICD-10-CM | POA: Insufficient documentation

## 2023-11-08 DIAGNOSIS — D6869 Other thrombophilia: Secondary | ICD-10-CM | POA: Diagnosis not present

## 2023-11-08 DIAGNOSIS — N184 Chronic kidney disease, stage 4 (severe): Secondary | ICD-10-CM | POA: Diagnosis not present

## 2023-11-08 NOTE — Progress Notes (Signed)
 Primary Care Physician: Rollene Almarie LABOR, MD Primary Cardiologist: Dr. Gollan Primary Electrophysiologist: Dr. Nancey Referring Physician: Dr Nancey    Brenda [name] is a 73 y.o. female with a history of CAD, DM2, CLL, HTN, CKD IIIb-IV, and persistent atrial fibrillation who presents for follow up in the [facility]. The patient was initially diagnosed with atrial fibrillation in May 2022 and placed on metoprolol  and Eliquis . She was started on amiodarone  in June 2023. September 2023 cardiac monitor for lightheadedness and dizziness showed 27% AF burden and multiple pauses with longest lasting 8 seconds. Her carvedilol  and amiodarone  were discontinued. Follow up monitor did not show any significant pauses. She was seen by Cardiology on 10/14/22 for palpitations having coincided 2 days prior with initiation of levothyroxine . She was noted to be in Afib with RVR. She was sent to the ED and underwent successful cardioversion. Seen by Dr. Nancey 10/18/22 and after discussion agreed to proceed with ablation. She is now s/p Afib ablation on 01/14/23. Patient is on Eliquis  5 mg BID for a CHADS2VASC score of 5.  In September 2023, she complained of lightheadedness and dizziness. A Zio patch was placed that showed sinus pauses up to about 8 seconds, so her Coreg  and amiodarone  were discontinued. Additionally, her TSH was elevated at 11.7. Pacemaker placement was deferred with a reversible cause. Monitor placed and follow-up did not show any atrial fibrillation or significant bradycardia. She did undergo DCCV on 06/19/23 in the ED and then again as an outpatient on 08/24/23.   On follow up today, patient is s/p repeat afib and flutter ablation 10/11/23. She did feel poorly for the first two weeks with fatigue and SOB. She also had some throat irritation for a few days. Her symptoms have resolved. She denies any interim symptoms of afib. No chest pain or groin issues.  Today,  she denies symptoms of palpitations, chest pain, shortness of breath, orthopnea, PND, lower extremity edema, dizziness, presyncope, syncope, snoring, daytime somnolence, bleeding, or neurologic sequela. The patient is tolerating medications without difficulties and is otherwise without complaint today.   Atrial Fibrillation Risk Factors:  she does not have a history of rheumatic fever. she does not have a history of alcohol use. The patient does not have a history of early familial atrial fibrillation or other arrhythmias.   Atrial Fibrillation Management history:  Previous antiarrhythmic drugs: amiodarone  Previous cardioversions: 10/14/22, 06/19/23, 08/24/23 Previous ablations: 01/14/23 Anticoagulation history: Eliquis  5 mg BID   Past Medical History:  Diagnosis Date   Anxiety    Arthritis    Atrial fibrillation (HCC)    CLL (chronic lymphocytic leukemia) (HCC)    Dx. 2019 Dr. 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 Denver first visit 11-2019     Current Outpatient Medications  Medication Sig Dispense Refill   acetaminophen  (TYLENOL ) 500 MG tablet Take 500-1,000 mg by mouth every 6 (six) hours as needed for mild pain, moderate pain or  fever.      ALPRAZolam  (XANAX ) 1 MG tablet Take 1 tablet (1 mg total) by mouth 2 (two) times daily as needed for anxiety. 60 tablet 5   amoxicillin (AMOXIL) 500 MG capsule Take 2,000 mg by mouth See admin instructions. 1 hour before dental appointment     Blood Glucose Monitoring Suppl (ONETOUCH VERIO) w/Device KIT Use as advised 1 kit 0   buPROPion  (WELLBUTRIN  XL) 300 MG 24 hr tablet TAKE 1 TABLET(300 MG) BY MOUTH DAILY 90 tablet 3   Cholecalciferol  (VITAMIN D3) 2000 units TABS Take 2,000 mcg by mouth daily.     Cyanocobalamin  (B-12) 1000 MCG SUBL Place 1,000 mcg under the tongue every evening.      doxazosin  (CARDURA ) 2 MG tablet TAKE 1 TABLET(2 MG) BY MOUTH EVERY EVENING 90 tablet 0   ELIQUIS  5 MG TABS tablet TAKE 1 TABLET(5 MG) BY MOUTH TWICE DAILY 60 tablet 5   esomeprazole (NEXIUM) 20 MG capsule Take 20 mg by mouth daily.     ezetimibe  (ZETIA ) 10 MG tablet TAKE 1 TABLET(10 MG) BY MOUTH DAILY 90 tablet 0   glucose blood (ONETOUCH VERIO) test strip USE TO TEST BLOOD GLUCOSE THREE TIMES DAILY. 300 strip 5   insulin  glargine (LANTUS  SOLOSTAR) 100 UNIT/ML Solostar Pen Inject under skin 16-22 units after dinner 30 mL 3   Insulin  Pen Needle (BD PEN NEEDLE NANO 2ND GEN) 32G X 4 MM MISC Use to inject insulin  daily 100 each 3   iron  polysaccharides (NIFEREX) 150 MG capsule Take 150 mg by mouth daily.     JARDIANCE  10 MG TABS tablet TAKE 1 TABLET(10 MG) BY MOUTH DAILY BEFORE BREAKFAST 30 tablet 11   Lancet Devices (LANCING DEVICE) MISC Use as advised - Freestyle 1 each 0   Lancets (ONETOUCH DELICA PLUS LANCET30G) MISC TEST BLOOD SUGAR THREE TIMES DAILY AS DIRECTED 300 each 1   lisinopril  (ZESTRIL ) 20 MG tablet TAKE 1 TABLET(20 MG) BY MOUTH TWICE DAILY 180 tablet 0   metoprolol  succinate (TOPROL  XL) 25 MG 24 hr tablet Take 1 tablet (25 mg total) by mouth daily. Take with or immediately following a meal. 30 tablet 11   NONFORMULARY OR COMPOUNDED ITEM Apply 1 application  topically 2 (two) times daily as  needed (sun damage on face). FLUOROURACIL 5% + CALCIPOTRIENE 0.005%     simvastatin  (ZOCOR ) 40 MG tablet TAKE 1 TABLET(40 MG) BY MOUTH AT BEDTIME 90 tablet 2   No current facility-administered medications for this encounter.    ROS- All systems are reviewed and negative except as per the HPI above.  Physical Exam: Vitals:   11/08/23 1120  BP: 116/66  Pulse: 62  Weight: 76.5 kg  Height: 5' (1.524 m)    GEN: Well nourished, well developed in no acute distress NECK: No JVD; No carotid bruits CARDIAC: Regular rate and rhythm, no murmurs, rubs, gallops RESPIRATORY:  Clear to auscultation without rales, wheezing or rhonchi  ABDOMEN: Soft, non-tender, non-distended EXTREMITIES:  No edema; No deformity    Wt Readings from Last 3 Encounters:  11/08/23 76.5  kg  10/11/23 72.1 kg  09/08/23 75.5 kg    EKG today demonstrates  SR Vent. rate 62 BPM PR interval 196 ms QRS duration 84 ms QT/QTcB 432/438 ms   Echo 05/22/21 demonstrated:  1. Left ventricular ejection fraction, by estimation, is 60 to 65%. The  left ventricle has normal function. The left ventricle has no regional  wall motion abnormalities. Left ventricular diastolic parameters were  normal.   2. Right ventricular systolic function is normal. The right ventricular  size is normal.   3. The mitral valve is normal in structure. Mild mitral valve  regurgitation.  Epic records are reviewed at length today.  CHA2DS2-VASc Score = 5  The patient's score is based upon: CHF History: 0 HTN History: 1 Diabetes History: 1 Stroke History: 0 Vascular Disease History: 1 Age Score: 1 Gender Score: 1       ASSESSMENT AND PLAN: Persistent Atrial Fibrillation/atrial flutter The patient's CHA2DS2-VASc score is 5, indicating a 7.2% annual risk of stroke.   S/p Afib ablation on 01/17/23 and repeat afib and flutter ablation 10/11/23 Not felt to be a good candidate for most AAD due to renal function.  Patient appears to be  maintaining SR Continue Eliquis  5 mg BID with no missed doses for 3 months post ablation. Continue Toprol  25 mg daily. She also has PRN diltiazem  and propranolol .   Secondary Hypercoagulable State (ICD10:  D68.69) The patient is at significant risk for stroke/thromboembolism based upon her CHA2DS2-VASc Score of 5.  Continue Apixaban  (Eliquis ).   HTN Stable on current regimen   Follow up with Jodie Passey as scheduled.     Daril Kicks PA-C [facility] [address] [place], [place] [address] [phone] 11/08/2023 11:36 AM

## 2023-11-12 ENCOUNTER — Other Ambulatory Visit: Payer: Self-pay | Admitting: Internal Medicine

## 2023-11-14 ENCOUNTER — Encounter: Payer: Self-pay | Admitting: Internal Medicine

## 2023-11-15 ENCOUNTER — Other Ambulatory Visit: Payer: Self-pay | Admitting: Internal Medicine

## 2023-11-15 DIAGNOSIS — Z1231 Encounter for screening mammogram for malignant neoplasm of breast: Secondary | ICD-10-CM

## 2023-11-16 ENCOUNTER — Ambulatory Visit: Payer: Medicare Other | Admitting: Internal Medicine

## 2023-11-16 ENCOUNTER — Encounter: Payer: Self-pay | Admitting: Internal Medicine

## 2023-11-16 VITALS — BP 124/80 | HR 77 | Temp 98.2°F | Ht 60.0 in | Wt 171.0 lb

## 2023-11-16 DIAGNOSIS — I484 Atypical atrial flutter: Secondary | ICD-10-CM | POA: Diagnosis not present

## 2023-11-16 DIAGNOSIS — F419 Anxiety disorder, unspecified: Secondary | ICD-10-CM | POA: Diagnosis not present

## 2023-11-16 DIAGNOSIS — C9110 Chronic lymphocytic leukemia of B-cell type not having achieved remission: Secondary | ICD-10-CM

## 2023-11-16 DIAGNOSIS — N1832 Chronic kidney disease, stage 3b: Secondary | ICD-10-CM | POA: Diagnosis not present

## 2023-11-16 NOTE — Assessment & Plan Note (Signed)
 Stable 3b and sees nephrology every 8 months with intermittent labs as needed with us  and cardiology. Not due for labs today and BP at goal.

## 2023-11-16 NOTE — Assessment & Plan Note (Signed)
 Currently sounds regular and recent ablation. She did have significant post-procedure problems for about 3 weeks. She did not feel that the cardiology office was able to address her concerns. She did end up having to call EMS within 1-2 days of procedure due to severe cough and SOB. She may have had a panic attack and took extra alprazolam . Cough lasted 3 days post op. She had gas/bloating for 3 weeks which was moderate to severe in nature and caused decrease appetite/eating.

## 2023-11-16 NOTE — Progress Notes (Signed)
   Subjective:   Patient ID: Brenda [name], female    DOB: 03-09-1951, 73 y.o.   MRN: [id number]  HPI The patient is a 73 YO female coming in for follow up. New ablation since last visit and had extended recovery afterwards. Loss of dog she had for 15 years and church has closed since our last visit. She is struggling with coping and finding a new normal.   Review of Systems  Constitutional:  Positive for activity change.  HENT: Negative.    Eyes: Negative.   Respiratory:  Negative for cough, chest tightness and shortness of breath.   Cardiovascular:  Negative for chest pain, palpitations and leg swelling.  Gastrointestinal:  Negative for abdominal distention, abdominal pain, constipation, diarrhea, nausea and vomiting.  Musculoskeletal: Negative.   Skin: Negative.   Neurological: Negative.   Psychiatric/Behavioral:  Positive for dysphoric mood.     Objective:  Physical Exam Constitutional:      Appearance: She is well-developed.  HENT:     Head: Normocephalic and atraumatic.  Cardiovascular:     Rate and Rhythm: Normal rate and regular rhythm.  Pulmonary:     Effort: Pulmonary effort is normal. No respiratory distress.     Breath sounds: Normal breath sounds. No wheezing or rales.  Abdominal:     General: Bowel sounds are normal. There is no distension.     Palpations: Abdomen is soft.     Tenderness: There is no abdominal tenderness. There is no rebound.  Musculoskeletal:     Cervical back: Normal range of motion.  Skin:    General: Skin is warm and dry.  Neurological:     Mental Status: She is alert and oriented to person, place, and time.     Coordination: Coordination abnormal.     Comments: Slow gait     Vitals:   11/16/23 0758  BP: 124/80  Pulse: 77  Temp: 98.2 F (36.8 C)  TempSrc: Oral  SpO2: 93%  Weight: 171 lb (77.6 kg)  Height: 5' (1.524 m)    Assessment & Plan:  Visit time 35 minutes in face to face communication with patient and coordination  of care, additional 10 minutes spent in record review, coordination or care, ordering tests, communicating/referring to other healthcare professionals, documenting in medical records all on the same day of the visit for total time 45 minutes spent on the visit.

## 2023-11-16 NOTE — Assessment & Plan Note (Signed)
 She is struggling with this worse since loss of church and dog shortly together. She is using alprazolam  1 mg BID prn. This is effective in combination with wellbutrin  300 mg daily. She does not feel dosage change is appropriate at this time she will wait and see how she does. Supportive listening used during visit.

## 2023-11-16 NOTE — Assessment & Plan Note (Signed)
 WBC stable and no new symptoms. She does have chronic fatigue which is stable.

## 2023-11-18 ENCOUNTER — Other Ambulatory Visit: Payer: Self-pay

## 2023-11-18 MED ORDER — ONETOUCH VERIO VI STRP: ORAL_STRIP | 5 refills | Status: DC

## 2023-11-22 ENCOUNTER — Other Ambulatory Visit: Payer: Self-pay | Admitting: Internal Medicine

## 2023-11-25 ENCOUNTER — Ambulatory Visit
Admission: RE | Admit: 2023-11-25 | Discharge: 2023-11-25 | Disposition: A | Discharge status: Home / Self Care | Payer: Medicare Other | Source: Ambulatory Visit

## 2023-11-25 DIAGNOSIS — Z1231 Encounter for screening mammogram for malignant neoplasm of breast: Secondary | ICD-10-CM

## 2023-11-28 ENCOUNTER — Other Ambulatory Visit: Payer: Self-pay | Admitting: Cardiovascular Disease

## 2023-11-29 ENCOUNTER — Other Ambulatory Visit: Payer: Self-pay | Admitting: Cardiovascular Disease

## 2023-11-29 ENCOUNTER — Other Ambulatory Visit: Payer: Self-pay | Admitting: Internal Medicine

## 2023-11-29 DIAGNOSIS — R928 Other abnormal and inconclusive findings on diagnostic imaging of breast: Secondary | ICD-10-CM

## 2023-12-01 ENCOUNTER — Ambulatory Visit: Payer: Medicare Other | Admitting: Cardiovascular Disease

## 2023-12-20 ENCOUNTER — Ambulatory Visit: Payer: Medicare Other | Admitting: Internal Medicine

## 2023-12-20 ENCOUNTER — Encounter: Payer: Self-pay | Admitting: Internal Medicine

## 2023-12-20 VITALS — BP 120/70 | HR 104 | Ht 60.0 in | Wt 167.4 lb

## 2023-12-20 DIAGNOSIS — Z7984 Long term (current) use of oral hypoglycemic drugs: Secondary | ICD-10-CM | POA: Diagnosis not present

## 2023-12-20 DIAGNOSIS — E032 Hypothyroidism due to medicaments and other exogenous substances: Secondary | ICD-10-CM

## 2023-12-20 DIAGNOSIS — E785 Hyperlipidemia, unspecified: Secondary | ICD-10-CM | POA: Diagnosis not present

## 2023-12-20 DIAGNOSIS — Z6835 Body mass index (BMI) 35.0-35.9, adult: Secondary | ICD-10-CM

## 2023-12-20 DIAGNOSIS — E66812 Obesity, class 2: Secondary | ICD-10-CM

## 2023-12-20 DIAGNOSIS — E1169 Type 2 diabetes mellitus with other specified complication: Secondary | ICD-10-CM

## 2023-12-20 DIAGNOSIS — E1165 Type 2 diabetes mellitus with hyperglycemia: Secondary | ICD-10-CM | POA: Diagnosis not present

## 2023-12-20 DIAGNOSIS — Z794 Long term (current) use of insulin: Secondary | ICD-10-CM | POA: Diagnosis not present

## 2023-12-20 LAB — POCT GLYCOSYLATED HEMOGLOBIN (HGB A1C): Hemoglobin A1C: 6.1 % — AB (ref 4.0–5.6)

## 2023-12-20 MED ORDER — LANTUS SOLOSTAR 100 UNIT/ML ~~LOC~~ SOPN: PEN_INJECTOR | SUBCUTANEOUS | Status: DC

## 2023-12-20 MED ORDER — LANTUS SOLOSTAR 100 UNIT/ML ~~LOC~~ SOPN: PEN_INJECTOR | SUBCUTANEOUS | 3 refills | Status: AC

## 2023-12-20 NOTE — Patient Instructions (Addendum)
Please continue: - Jardiance 10 mg before b'fast  - Lantus (20-)22 units after dinner   Please return in 4 months with your sugar log.

## 2023-12-20 NOTE — Addendum Note (Signed)
Addended by: Pollie Meyer on: 12/20/2023 10:04 AM   Modules accepted: Orders

## 2023-12-20 NOTE — Progress Notes (Signed)
Patient ID: Brenda [name], female   DOB: 10-30-1951, 73 y.o.   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 Last visit 4 months ago.  Interim history: + increased urination, but no blurry vision, nausea, chest pain.   She has Afib >> had 2x cardioversion but  not successful.  She had a cardiac ablation in 10/2023 -she had to lay down for 4 hours afterwards and was in significant distress. She developed a cough after this. She then developed bloating and fulness, could not eat. She then had to put her 39 y/o dog to sleep.  She has a lot of stress and she is tearful in the office today.  DM2: Reviewed HbA1c levels: Lab Results  Component Value Date   HGBA1C 6.2 (A) 08/18/2023   HGBA1C 6.4 (A) 04/25/2023   HGBA1C 6.1 (A) 11/26/2022   Patient is on: - Lantus 12 units in a.m.- started 05/2019 by PCP >> 24 >> 18-20 >> 16-18 >> 18-20 >> 20 units after dinner - Jardiance 10 before breakfast-added 11/2020 She could not start Ozempic since our visit in 08/2023 due to price. She was on Glipizide in 11/2015 after a steroid inj. We stopped Victoza due to nausea, dehydration. Metformin ER was stopped by PCP due to CKD. Glipizide ER was stopped 06/2019 due to low blood sugars. She was previously on glipizide XL but stopped 05/2022.  Pt checks her sugars twice a day: - am: 74-130, 142 >> 73-127, 133 >> 73-130, 136 - 2h after b'fast: 156 >> n/c >> 148 >> n/c >> 134 >> n/c - before lunch: 68-121, 140 >> 78-125, 140, 145 - 2h after lunch: 118, 123 >> 92, 99 >> n/c >> 145 >> n/c  - before dinner: 67-123, 132 >> 80-136, 140, 150 - 2h after dinner: 138, 148 >> n/c >> 133 >> n/c - bedtime: 93-115 >> n/c >> 108 >> n/c >> 119 - nighttime: n/c Lowest sugar was 37 x1 >> ... 68 >> 73; she has hypoglycemia  awareness in the 70s. Highest sugar was 170 >> 140 >> 150.  Glucometer: Freestyle Lite  Pt's meals are: - Breakfast: English muffin + preserve or cereal or yoghurt - snack: fruit or fruit + yoghurt - Lunch: 1/2 sandwich + chips + salsa + fruit/sugar free >> protein bar - Dinner: soup or chicken/steak + veggies, occasional starch or Lean cuisine or light salad No soft drinks.  -+ CKD - sees Dr. Signe Colt, last BUN/creatinine:  Lab Results  Component Value Date   BUN 33 (H) 09/19/2023   CREATININE 1.75 (H) 09/19/2023  On benazepril.  Sees nephrology. She had a high potassium (5.9) >> used Kayexalate x3 >> normalized.  She is on a low potassium diet.  -+ HL last set of lipids: Lab Results  Component Value Date   CHOL 121 08/18/2023   HDL 31.10 (L) 08/18/2023   LDLCALC 69 08/18/2023   LDLDIRECT 55.0 05/15/2020   TRIG 108.0 08/18/2023   CHOLHDL 4 08/18/2023  On Zocor and Zetia.  - last eye exam 04/2023: No DR; she had cataract surgery in 2016.  She has a history of retinal tear.  - no numbness and tingling in her feet.  She takes Neurontin for leg cramps at night.  Last foot exam 04/25/2023.  Acquired hypothyroidism: -Likely related to amiodarone treatment >> now off definitively  Reviewed her TFTs: Lab Results  Component Value Date   TSH 3.40 08/18/2023   TSH 3.833 06/19/2023   TSH 4.67 04/25/2023   TSH 4.99 12/28/2022   TSH 8.742 (H) 11/17/2022   TSH 22.801 (H) 09/27/2022   TSH 11.721 (H) 07/30/2022   TSH 2.453 04/19/2022   TSH 2.640 04/14/2021   TSH 1.643 11/19/2017   TSH 2.67 03/07/2017   Lab Results  Component Value Date   FREET4 1.01 08/18/2023   FREET4 0.88 06/19/2023   FREET4 1.00 04/25/2023   FREET4 0.77 12/28/2022   FREET4 0.77 11/17/2022   FREET4 0.55 (L) 09/27/2022   T3FREE 3.2 08/18/2023   T3FREE 3.0 11/17/2022   She was started on levothyroxine 25 mcg daily - started 09/2022.  However, we were able to stop the medication in 04/2023.  She also has  atrial fibrillation - She was in the emergency room with A-fib with RVR 10/14/2022. She had a cardioversion. She had a cardiac ablation 12/2022.    She also has HTN, GERD, depression. She was admitted 11/2017 for dehydration, anemia, and acute renal failure >> diagnosed with CLL. She has lifelong insomnia.  In the past, she was waking up at 3 AM to go to work at 5:30 AM.   ROS: + see HPI  I reviewed pt's medications, allergies, PMH, social hx, family hx, and changes were documented in the history of present illness. Otherwise, unchanged from my initial visit note.  Past Medical History:  Diagnosis Date   Anxiety    Arthritis    Atrial fibrillation (HCC)    CLL (chronic lymphocytic leukemia) (HCC)    Dx. 2019 Dr. 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 Sebastian Ache first visit 11-2019   Past Surgical History:  Procedure Laterality Date   ABDOMINAL HYSTERECTOMY  1998   ATRIAL FIBRILLATION ABLATION N/A 01/14/2023   Procedure: ATRIAL FIBRILLATION ABLATION;  Surgeon: Maurice Small, MD;  Location: [facility];  Service: Cardiovascular;  Laterality: N/A;   ATRIAL FIBRILLATION ABLATION N/A 10/11/2023   Procedure: ATRIAL FIBRILLATION ABLATION;   Surgeon: Maurice Small, MD;  Location: [facility];  Service: Cardiovascular;  Laterality: N/A;   CARDIOVERSION N/A 08/24/2023   Procedure: CARDIOVERSION;  Surgeon: Quintella Reichert, MD;  Location: [facility];  Service: Cardiovascular;  Laterality: N/A;   COLONOSCOPY     EYE SURGERY Bilateral    cataracts   EYE SURGERY Left    retina tear   FEMUR IM NAIL  10/25/2012   Procedure: INTRAMEDULLARY (  IM) NAIL FEMORAL;  Surgeon: Shelda Pal, MD;  Location: [facility];  Service: Orthopedics;  Laterality: Left;   HIP SURGERY     Fracture L IM nail and 3 rods   left knee arthroscopy  12/2010   LYMPH NODE BIOPSY     axillary left 12-2018   REFRACTIVE SURGERY     skin cancer removed right and and lower forearm     squamoun in situ   TOTAL KNEE ARTHROPLASTY  12/20/2011   Procedure: TOTAL KNEE ARTHROPLASTY;  Surgeon: Loanne Drilling, MD;  Location: [facility];  Service: Orthopedics;  Laterality: Left;  Failed attempt at spinal    TOTAL SHOULDER ARTHROPLASTY Right 08/19/2016   Procedure: RIGHT TOTAL SHOULDER ARTHROPLASTY;  Surgeon: Francena Hanly, MD;  Location: [facility];  Service: Orthopedics;  Laterality: Right;   TOTAL SHOULDER ARTHROPLASTY Left 01/10/2020   Procedure: TOTAL SHOULDER ARTHROPLASTY;  Surgeon: Francena Hanly, MD;  Location: [facility];  Service: Orthopedics;  Laterality: Left;    TRANSESOPHAGEAL ECHOCARDIOGRAM (CATH LAB) N/A 10/11/2023   Procedure: TRANSESOPHAGEAL ECHOCARDIOGRAM;  Surgeon: Maurice Small, MD;  Location: [facility];  Service: Cardiovascular;  Laterality: N/A;   Social History   Social History   Marital Status: Single    Spouse Name: N/A   Number of Children: 0   Occupational History   Charity fundraiser at [employer]    Social History Main Topics   Smoking status: Never Smoker    Smokeless tobacco: Never Used   Alcohol Use: No   Drug Use: No   Social History Designer, 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      ALPRAZolam (XANAX) 1 MG tablet Take 1 tablet (1 mg total) by mouth 2 (two) times daily as needed for anxiety. 60 tablet 5   amoxicillin (AMOXIL) 500 MG capsule Take 2,000 mg by mouth See admin instructions. 1 hour before dental appointment     Blood Glucose Monitoring Suppl (ONETOUCH VERIO) w/Device KIT Use as advised 1 kit 0   buPROPion (WELLBUTRIN XL) 300 MG 24 hr tablet TAKE 1 TABLET(300 MG) BY MOUTH DAILY 90 tablet 3   Cholecalciferol (VITAMIN D3) 2000 units TABS Take 2,000 mcg by mouth daily.     Cyanocobalamin (B-12) 1000 MCG SUBL Place 1,000 mcg under the tongue every evening.     doxazosin (CARDURA) 2 MG tablet TAKE 1 TABLET(2 MG) BY MOUTH EVERY EVENING 90 tablet 3   ELIQUIS 5 MG TABS tablet TAKE 1 TABLET(5 MG) BY MOUTH TWICE DAILY 60 tablet 5   esomeprazole (NEXIUM) 20 MG capsule Take 20 mg by mouth daily.     ezetimibe (ZETIA) 10 MG tablet Take 1 tablet (10 mg total) by mouth daily. PLEASE CALL [phone] TO SCHEDULE FOLLOW UP APPOINTMENT PRIOR TO NEXT REFILL REQUEST. THANK YOU. 90 tablet 0   glucose blood (ONETOUCH VERIO) test strip USE TO TEST BLOOD GLUCOSE THREE TIMES DAILY. 300 strip 5   insulin glargine (LANTUS SOLOSTAR) 100 UNIT/ML Solostar Pen Inject under skin 16-22 units after dinner 30 mL 3   Insulin Pen Needle (BD PEN NEEDLE NANO 2ND GEN) 32G X 4 MM MISC Use to inject insulin daily 100 each 3   iron polysaccharides (NIFEREX) 150 MG capsule Take 150 mg by  mouth daily.     JARDIANCE 10 MG TABS tablet TAKE 1 TABLET(10 MG) BY MOUTH DAILY BEFORE BREAKFAST 30 tablet 11   Lancet Devices (LANCING DEVICE) MISC Use as advised - Freestyle 1 each 0   Lancets (ONETOUCH DELICA PLUS LANCET30G) MISC USE TO TEST BLOOD GLUCOSE THREE TIMES DAILY AS DIRECTED 300 each 1   lisinopril (ZESTRIL) 20 MG tablet TAKE 1 TABLET(20  MG) BY MOUTH TWICE DAILY 180 tablet 0   metoprolol succinate (TOPROL XL) 25 MG 24 hr tablet Take 1 tablet (25 mg total) by mouth daily. Take with or immediately following a meal. 30 tablet 11   NONFORMULARY OR COMPOUNDED ITEM Apply 1 application  topically 2 (two) times daily as needed (sun damage on face). FLUOROURACIL 5% + CALCIPOTRIENE 0.005%     simvastatin (ZOCOR) 40 MG tablet TAKE 1 TABLET(40 MG) BY MOUTH AT BEDTIME. PLEASE CALL [phone] TO SCHEDULE FOLLOW UP APPOINTMENT PRIOR TO NEXT REFILL REQUEST. THANK YOU. 90 tablet 0   No current facility-administered medications on file prior to visit.   Allergies  Allergen Reactions   Carvedilol Other (See Comments)    Sinus Pauses   Gadolinium Derivatives Other (See Comments)    Effects Kidney Function    Nsaids Other (See Comments)    Due to kidney function   Amiodarone Other (See Comments)    Causes bradycardia and decreased thyroid function   Amlodipine     Swelling-  leg swell    Beta Adrenergic Blockers     Decreased heart rate   Contrast Media [Iodinated Contrast Media] Other (See Comments)    Patient states unable to take / use due to kidney function, NOT allergy!   Gabapentin Other (See Comments)    confusion   Family History  Problem Relation Age of Onset   Cancer Mother        breast cancer   Cancer Father        plasma sarcoma   Breast cancer Neg Hx    PE: BP 120/70   Pulse (!) 104   Ht 5' (1.524 m)   Wt 167 lb 6.4 oz (75.9 kg)   SpO2 94%   BMI 32.69 kg/m   Wt Readings from Last 15 Encounters:  12/20/23 167 lb 6.4 oz (75.9 kg)  11/16/23 171 lb (77.6 kg)  11/08/23 168 lb 9.6 oz (76.5 kg)  10/11/23 159 lb (72.1 kg)  09/08/23 166 lb 6.4 oz (75.5 kg)  08/24/23 167 lb 15.9 oz (76.2 kg)  08/19/23 168 lb (76.2 kg)  08/18/23 170 lb 6.4 oz (77.3 kg)  07/08/23 174 lb 9.6 oz (79.2 kg)  07/01/23 171 lb 8 oz (77.8 kg)  06/16/23 174 lb 9.6 oz (79.2 kg)  06/14/23 174 lb 12.8 oz (79.3 kg)  05/16/23 178 lb (80.7  kg)  04/25/23 178 lb 12.8 oz (81.1 kg)  04/19/23 178 lb 6.4 oz (80.9 kg)   Constitutional: overweight, in NAD Eyes: EOMI, no exophthalmos ENT: no thyromegaly, no cervical lymphadenopathy Cardiovascular: No tachycardia at the time of the exam, RRR, No MRG Respiratory: CTA B Musculoskeletal: no deformities Skin: no rashes Neurological: no tremor with outstretched hands  Assessment: 1. DM2, insulin-dependent, uncontrolled, with complications  - mild CKD  2. Obesity class 2  3. HL  4.  Amiodarone-induced hypothyroidism  PLAN:  1. Patient with longstanding, uncontrolled, type 2 diabetes, with much improved control after adding insulin in summer 2020.  At that time, metformin was stopped due to worsening kidney function.  We  also have her on an SGLT2 inhibitor.  She was previously on Victoza but could not tolerate it due to nausea.  She was reticent to retry GLP-1 receptor agonist afterwards but she finally agreed to start Ozempic.  Unfortunately, this was not covered so she is not able to start since last visit. -At last visit, HbA1c was better, at 6.2%. -At today's visit, sugars are mostly at goal, with only an occasional mild hyperglycemic spike.  She tells me that Ozempic would be covered for her in the new year but she would not want to start this for now.  She is very afraid of GI side effects.  We discussed that we do not absolutely need to add it for now, but we can readdress in the near future.  Will continue the same regimen for now. - I suggested to:  Patient Instructions  Please continue: - Jardiance 10 mg before b'fast  - Lantus (20-)22 units after dinner   Please return in 4 months with your sugar log.  - we checked her HbA1c: 6.1% (lower) - advised to check sugars at different times of the day - 1x a day, rotating check times - advised for yearly eye exams >> she is UTD - return to clinic in 4 months  2. Obesity class 2 -She has problems eating the right foods, since  she also has to limit potassium -Continue Jardiance which should also help with weight loss, however, she could not start Ozempic due to price -we previously discussed that she continued to gain weight and unfortunately this was putting more strain on her back.  She cannot exercise due to the pain and was thinking about physical therapy.  I strongly advised her to do so.  She mentioned that she was getting severe back pain with standing for 2 to 3 hours.  I advised her to try to exercise for shorter periods of time and also to do some chair exercises -She lost 18 pounds over approximately a year before last visit -She lost 3 pounds net since last visit -She tells me that she believes that Ozempic is covered in the new year, but for now, she would not want to start it.  3.  Hyperlipidemia -Lipid panel was reviewed from 08/2023: LDL at goal, HDL slightly low: Lab Results  Component Value Date   CHOL 121 08/18/2023   HDL 31.10 (L) 08/18/2023   LDLCALC 69 08/18/2023   LDLDIRECT 55.0 05/15/2020   TRIG 108.0 08/18/2023   CHOLHDL 4 08/18/2023  -Continues on Zocor 40 mg daily and Zetia 10 mg daily without side effects  4.  Amiodarone-induced hypothyroidism -Due to amiodarone, likely, now off the medication -Latest TFTs reviewed with patient.  All were normal (see TSH below), off levothyroxine Lab Results  Component Value Date   TSH 3.40 08/18/2023  -Will continue to keep an eye on her TFTs  Carlus Pavlov, MD PhD [facility]

## 2023-12-22 ENCOUNTER — Other Ambulatory Visit: Payer: Medicare Other

## 2023-12-24 ENCOUNTER — Ambulatory Visit
Admission: RE | Admit: 2023-12-24 | Discharge: 2023-12-24 | Disposition: A | Discharge status: Home / Self Care | Payer: Medicare Other | Source: Ambulatory Visit | Attending: Internal Medicine | Admitting: Internal Medicine

## 2023-12-24 ENCOUNTER — Ambulatory Visit: Payer: Medicare Other

## 2023-12-24 DIAGNOSIS — R928 Other abnormal and inconclusive findings on diagnostic imaging of breast: Secondary | ICD-10-CM

## 2023-12-27 ENCOUNTER — Ambulatory Visit (HOSPITAL_COMMUNITY): Admit: 2023-12-27 | Payer: Medicare Other | Admitting: Cardiovascular Disease

## 2023-12-27 ENCOUNTER — Encounter (HOSPITAL_COMMUNITY): Payer: Self-pay

## 2023-12-27 SURGERY — ATRIAL FIBRILLATION ABLATION
Anesthesia: General

## 2023-12-29 DIAGNOSIS — Z23 Encounter for immunization: Secondary | ICD-10-CM | POA: Diagnosis not present

## 2024-01-05 ENCOUNTER — Other Ambulatory Visit: Payer: Self-pay

## 2024-01-05 ENCOUNTER — Other Ambulatory Visit: Payer: Self-pay | Admitting: Cardiovascular Disease

## 2024-01-05 DIAGNOSIS — C9110 Chronic lymphocytic leukemia of B-cell type not having achieved remission: Secondary | ICD-10-CM

## 2024-01-05 NOTE — Telephone Encounter (Signed)
 Please contact pt for future appointment. Pt due for follow up appointment.

## 2024-01-06 ENCOUNTER — Inpatient Hospital Stay (HOSPITAL_BASED_OUTPATIENT_CLINIC_OR_DEPARTMENT_OTHER): Payer: Medicare Other | Admitting: Hematology

## 2024-01-06 ENCOUNTER — Inpatient Hospital Stay: Payer: Medicare Other | Attending: Hematology

## 2024-01-06 VITALS — BP 130/55 | HR 71 | Temp 97.7°F | Resp 15 | Ht 60.0 in | Wt 167.0 lb

## 2024-01-06 DIAGNOSIS — R918 Other nonspecific abnormal finding of lung field: Secondary | ICD-10-CM | POA: Diagnosis not present

## 2024-01-06 DIAGNOSIS — Z90710 Acquired absence of both cervix and uterus: Secondary | ICD-10-CM | POA: Insufficient documentation

## 2024-01-06 DIAGNOSIS — R590 Localized enlarged lymph nodes: Secondary | ICD-10-CM | POA: Insufficient documentation

## 2024-01-06 DIAGNOSIS — Z803 Family history of malignant neoplasm of breast: Secondary | ICD-10-CM | POA: Insufficient documentation

## 2024-01-06 DIAGNOSIS — C9110 Chronic lymphocytic leukemia of B-cell type not having achieved remission: Secondary | ICD-10-CM | POA: Diagnosis not present

## 2024-01-06 DIAGNOSIS — Z8542 Personal history of malignant neoplasm of other parts of uterus: Secondary | ICD-10-CM | POA: Diagnosis not present

## 2024-01-06 DIAGNOSIS — D509 Iron deficiency anemia, unspecified: Secondary | ICD-10-CM | POA: Insufficient documentation

## 2024-01-06 LAB — CBC WITH DIFFERENTIAL (CANCER CENTER ONLY)
Abs Immature Granulocytes: 0.03 10*3/uL (ref 0.00–0.07)
Basophils Absolute: 0.1 10*3/uL (ref 0.0–0.1)
Basophils Relative: 1 %
Eosinophils Absolute: 0.3 10*3/uL (ref 0.0–0.5)
Eosinophils Relative: 2 %
HCT: 40.6 % (ref 36.0–46.0)
Hemoglobin: 12.6 g/dL (ref 12.0–15.0)
Immature Granulocytes: 0 %
Lymphocytes Relative: 69 %
Lymphs Abs: 10.8 10*3/uL — ABNORMAL HIGH (ref 0.7–4.0)
MCH: 27.7 pg (ref 26.0–34.0)
MCHC: 31 g/dL (ref 30.0–36.0)
MCV: 89.2 fL (ref 80.0–100.0)
Monocytes Absolute: 1.2 10*3/uL — ABNORMAL HIGH (ref 0.1–1.0)
Monocytes Relative: 8 %
Neutro Abs: 3.1 10*3/uL (ref 1.7–7.7)
Neutrophils Relative %: 20 %
Platelet Count: 135 10*3/uL — ABNORMAL LOW (ref 150–400)
RBC: 4.55 MIL/uL (ref 3.87–5.11)
RDW: 13.6 % (ref 11.5–15.5)
WBC Count: 15.5 10*3/uL — ABNORMAL HIGH (ref 4.0–10.5)
nRBC: 0 % (ref 0.0–0.2)

## 2024-01-06 LAB — CMP (CANCER CENTER ONLY)
ALT: 7 U/L (ref 0–44)
AST: 13 U/L — ABNORMAL LOW (ref 15–41)
Albumin: 4.4 g/dL (ref 3.5–5.0)
Alkaline Phosphatase: 83 U/L (ref 38–126)
Anion gap: 5 (ref 5–15)
BUN: 40 mg/dL — ABNORMAL HIGH (ref 8–23)
CO2: 27 mmol/L (ref 22–32)
Calcium: 9.1 mg/dL (ref 8.9–10.3)
Chloride: 108 mmol/L (ref 98–111)
Creatinine: 1.45 mg/dL — ABNORMAL HIGH (ref 0.44–1.00)
GFR, Estimated: 38 mL/min — ABNORMAL LOW (ref >60)
Glucose, Bld: 117 mg/dL — ABNORMAL HIGH (ref 70–99)
Potassium: 4.4 mmol/L (ref 3.5–5.1)
Sodium: 140 mmol/L (ref 135–145)
Total Bilirubin: 0.5 mg/dL (ref 0.0–1.2)
Total Protein: 5.8 g/dL — ABNORMAL LOW (ref 6.5–8.1)

## 2024-01-06 LAB — LACTATE DEHYDROGENASE: LDH: 157 U/L (ref 98–192)

## 2024-01-06 NOTE — Progress Notes (Signed)
 HEMATOLOGY/ONCOLOGY CLINIC NOTE  Date of Service: 01/06/24    Patient Care Team: Myrlene Broker, MD as PCP - General (Internal Medicine) Mariah Milling Tollie Pizza, MD as PCP - Cardiology (Cardiology) Mealor, Roberts Gaudy, MD as PCP - Electrophysiology (Cardiology) Antonieta Iba, MD as Consulting Physician (Cardiology) Pa, [facility]  CHIEF COMPLAINTS/PURPOSE OF CONSULTATION:  Follow-up for continued evaluation and management of CLL  HISTORY OF PRESENTING ILLNESS:  Please see previous note for details on initial presentation  Interval History:  Brenda [name] here for continued evaluation management of her CLL. She was last seen by me on 07/08/2023 and reported issue with uncontrolled A-fib despite her ablation causing her to go to the emergency room for a cardiac defibrillation.  Today, she reports that she has not been doing too well since her last clinical visit. She reports that her first cardioversion in the ER only lasted a couple of weeks. Patient did receive a second radiofrequency ablation, which was also not pleasant and lasted only a couple of weeks.  She reports that there was some atrial flutter involvement as well.   She reports heart issues including findings of new fibrillation on the opposite side of her heart. Patient reports persistent cough symptom for the first 36 hours following her ablation procedure. She reports having difficulty breathing issues the second night following her ablation procedure and needed to call EMS in the middle of the night. EMS felt that her symptoms were related to a panic attack.   Patient reports that unfortunately, her dog passed away in September 25, 2024.   Patient also endorsed some GI issues including bloating, poor p.o intake due to persistent sense of fullness, diarrhea, as well as lack of energy for 2.5 weeks.   Her GI symptoms have improved at this time. Patient continues to have UTI issues at this time.   She  reports that her heart has returned to normal rhythm at this time. Patient will be seen by Dr. Morrie Sheldon PA on Monday.   She complains of feeling worried about dying alone. Patient generally connects with her close friends once a week for emotional support.   She continues to live independently and complete her daily activities on her own.   She reports that she is planning to move out of her current home due to feeling that her current home is too big a space for her at this time and wanting to simplify her life.   She reports that once her cough improved, she started Mucinex to help clear secretions.   Patient denies any fever, chills, night sweats, or new lumps/bumps. She reports feeling stable mild lumps in her neck. Patient reports feeling cold often.  She reports back pain which has mildly improved since losing some weight.   Patient reports having a previous mammogram which showed abnormal/asymmetrical results. However, she reports being told that the mammogram was misread and repeat mammogram showed no concerning findings.   She does regularly take vitamin B12 and iron supplements.   MEDICAL HISTORY:  Past Medical History:  Diagnosis Date   Anxiety    Arthritis    Atrial fibrillation (HCC)    CLL (chronic lymphocytic leukemia) (HCC)    Dx. 2019 Dr. 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 Sebastian Ache first visit 11-2019    SURGICAL HISTORY: Past Surgical History:  Procedure Laterality Date   ABDOMINAL HYSTERECTOMY  1998   ATRIAL FIBRILLATION ABLATION N/A 01/14/2023   Procedure: ATRIAL FIBRILLATION ABLATION;  Surgeon: Maurice Small, MD;  Location: [facility];  Service: Cardiovascular;  Laterality: N/A;   ATRIAL FIBRILLATION ABLATION N/A 10/11/2023   Procedure: ATRIAL FIBRILLATION ABLATION;  Surgeon: Maurice Small, MD;  Location: [facility];  Service: Cardiovascular;  Laterality: N/A;   CARDIOVERSION N/A 08/24/2023   Procedure: CARDIOVERSION;  Surgeon: Quintella Reichert, MD;  Location: [facility];  Service: Cardiovascular;  Laterality: N/A;   COLONOSCOPY     EYE SURGERY Bilateral    cataracts   EYE SURGERY Left    retina tear   FEMUR IM NAIL  10/25/2012   Procedure: INTRAMEDULLARY (IM) NAIL FEMORAL;  Surgeon: Shelda Pal, MD;  Location: [facility];  Service: Orthopedics;  Laterality: Left;   HIP SURGERY     Fracture L IM nail and 3 rods   left knee arthroscopy  12/2010   LYMPH NODE BIOPSY     axillary left 12-2018   REFRACTIVE SURGERY     skin cancer removed right and and lower forearm     squamoun in situ   TOTAL KNEE ARTHROPLASTY  12/20/2011   Procedure: TOTAL KNEE ARTHROPLASTY;  Surgeon: Loanne Drilling, MD;  Location: [facility];  Service: Orthopedics;  Laterality: Left;  Failed attempt at spinal    TOTAL SHOULDER ARTHROPLASTY Right 08/19/2016   Procedure: RIGHT TOTAL SHOULDER ARTHROPLASTY;  Surgeon: Francena Hanly, MD;  Location: [facility];  Service: Orthopedics;  Laterality: Right;   TOTAL SHOULDER ARTHROPLASTY Left 01/10/2020   Procedure: TOTAL SHOULDER ARTHROPLASTY;  Surgeon: Francena Hanly, MD;  Location: [facility];  Service: Orthopedics;  Laterality: Left;    TRANSESOPHAGEAL  ECHOCARDIOGRAM (CATH LAB) N/A 10/11/2023   Procedure: TRANSESOPHAGEAL ECHOCARDIOGRAM;  Surgeon: Maurice Small, MD;  [facility]: [facility];  Service: Cardiovascular;  Laterality: N/A;    SOCIAL HISTORY: Social History   Socioeconomic History   Marital status: Single    Spouse name: Not on file   Number of children: Not on file   Years of education: Not on file   Highest education level: Bachelor's degree (e.g., BA, AB, BS)  Occupational History   Occupation: Charity fundraiser at [employer]    Employer: [employer]  Tobacco Use   Smoking status: Never   Smokeless tobacco: Never   Tobacco comments:    Never smoked 11/08/23  Vaping Use   Vaping status: Never Used  Substance and Sexual Activity   Alcohol use: No   Drug use: No   Sexual activity: Not Currently  Other Topics Concern   Not on file  Social History Narrative   Retired [employer] short Stay   Social Drivers of Health   Financial Resource Strain: Low Risk  (11/12/2023)   Overall Financial Resource Strain (CARDIA)    Difficulty of Paying Living Expenses: Not very hard  Food Insecurity: No Food Insecurity (11/12/2023)   Hunger Vital Sign    Worried About Running Out of Food in the Last Year: Never true    Ran Out of Food in the Last Year: Never true  Transportation Needs: No Transportation Needs (11/12/2023)   PRAPARE - Administrator, Civil Service (Medical): No    Lack of Transportation (Non-Medical): No  Physical Activity: Unknown (11/12/2023)   Exercise Vital Sign    Days of Exercise per Week: 0 days    Minutes of Exercise per Session: Patient declined  Stress: Stress Concern Present (11/12/2023)   [employer] of Occupational Health - Occupational Stress Questionnaire    Feeling of Stress : Very much  Social Connections: Moderately Integrated (11/12/2023)   Social Connection and Isolation Panel [NHANES]    Frequency of Communication with Friends and Family: Three times a week    Frequency of  Social Gatherings with Friends and Family: Patient declined    Attends Religious Services: More than 4 times per year    Active Member of Clubs or Organizations: No    Attends Engineer, structural: More than 4 times per year    Marital Status: Never married  Intimate Partner Violence: Not At Risk (03/30/2023)   Humiliation, Afraid, Rape, and Kick questionnaire    Fear of Current or Ex-Partner: No    Emotionally Abused: No    Physically Abused: No    Sexually Abused: No    FAMILY HISTORY: Family History  Problem Relation Age of Onset   Cancer Mother        breast cancer   Cancer Father        plasma sarcoma   Breast cancer Neg Hx     ALLERGIES:  is allergic to carvedilol, gadolinium derivatives, nsaids, amiodarone, amlodipine, beta adrenergic blockers, contrast media [iodinated contrast media], and gabapentin.  MEDICATIONS:  Current Outpatient Medications  Medication Sig Dispense Refill   acetaminophen (TYLENOL) 500 MG tablet Take 500-1,000 mg by mouth every 6 (six) hours as needed for mild pain, moderate pain or fever.      ALPRAZolam (XANAX) 1 MG tablet Take 1 tablet (1 mg total) by mouth 2 (two) times daily as needed for anxiety. 60 tablet 5   amoxicillin (AMOXIL) 500 MG capsule Take 2,000 mg by mouth See admin instructions. 1 hour before dental appointment     Blood Glucose Monitoring Suppl (ONETOUCH VERIO) w/Device KIT Use as advised 1 kit 0   buPROPion (WELLBUTRIN XL) 300 MG 24 hr tablet TAKE 1 TABLET(300 MG) BY MOUTH DAILY 90 tablet 3   Cholecalciferol (VITAMIN D3) 2000 units TABS Take 2,000 mcg by mouth daily.     Cyanocobalamin (B-12) 1000 MCG SUBL Place 1,000 mcg under the tongue every evening.     doxazosin (CARDURA) 2 MG tablet TAKE 1 TABLET(2 MG) BY MOUTH EVERY EVENING 90 tablet 3   ELIQUIS 5 MG TABS tablet TAKE 1 TABLET(5 MG) BY MOUTH TWICE DAILY 60 tablet 5   esomeprazole (NEXIUM) 20 MG capsule Take 20 mg by mouth daily.     ezetimibe (ZETIA) 10 MG tablet  Take 1 tablet (10 mg total) by mouth daily. PLEASE CALL [phone]  TO SCHEDULE FOLLOW UP APPOINTMENT PRIOR TO NEXT REFILL REQUEST. THANK YOU. 90 tablet 0   glucose blood (ONETOUCH VERIO) test strip USE TO TEST BLOOD GLUCOSE THREE TIMES DAILY. 300 strip 5   insulin glargine (LANTUS SOLOSTAR) 100 UNIT/ML Solostar Pen Inject under skin 20-24 units after dinner 30 mL 3   Insulin Pen Needle (BD PEN NEEDLE NANO 2ND GEN) 32G X 4 MM MISC Use to inject insulin daily 100 each 3   iron polysaccharides (NIFEREX) 150 MG capsule Take 150 mg by mouth daily.     JARDIANCE 10 MG TABS tablet TAKE 1 TABLET(10 MG) BY MOUTH DAILY BEFORE BREAKFAST 30 tablet 11   Lancet Devices (LANCING DEVICE) MISC Use as advised - Freestyle 1 each 0   Lancets (ONETOUCH DELICA PLUS LANCET30G) MISC USE TO TEST BLOOD GLUCOSE THREE TIMES DAILY AS DIRECTED 300 each 1   lisinopril (ZESTRIL) 20 MG tablet TAKE 1 TABLET(20 MG) BY MOUTH TWICE DAILY 180 tablet 0   metoprolol succinate (TOPROL XL) 25 MG 24 hr tablet Take 1 tablet (25 mg total) by mouth daily. Take with or immediately following a meal. 30 tablet 11   NONFORMULARY OR COMPOUNDED ITEM Apply 1 application  topically 2 (two) times daily as needed (sun damage on face). FLUOROURACIL 5% + CALCIPOTRIENE 0.005%     simvastatin (ZOCOR) 40 MG tablet TAKE 1 TABLET(40 MG) BY MOUTH AT BEDTIME. PLEASE CALL [phone] TO SCHEDULE FOLLOW UP APPOINTMENT PRIOR TO NEXT REFILL REQUEST. THANK YOU. 90 tablet 0   No current facility-administered medications for this visit.    REVIEW OF SYSTEMS:    10 Point review of Systems was done is negative except as noted above.   PHYSICAL EXAMINATION: ECOG PERFORMANCE STATUS: 1 - Symptomatic but completely ambulatory  Vitals:   01/06/24 0858  BP: (!) 130/55  Pulse: 71  Resp: 15  Temp: 97.7 F (36.5 C)  SpO2: 97%   Filed Weights   01/06/24 0858  Weight: 167 lb (75.8 kg)   .Body mass index is 32.61 kg/m. GENERAL:alert, in no acute distress and  comfortable SKIN: no acute rashes, no significant lesions EYES: conjunctiva are pink and non-injected, sclera anicteric OROPHARYNX: MMM, no exudates, no oropharyngeal erythema or ulceration NECK: supple, no JVD LYMPH:  no palpable lymphadenopathy in the cervical, axillary or inguinal regions LUNGS: clear to auscultation b/l with normal respiratory effort HEART: regular rate & rhythm ABDOMEN:  normoactive bowel sounds , non tender, not distended. Extremity: no pedal edema PSYCH: alert & oriented x 3 with fluent speech NEURO: no focal motor/sensory deficits   LABORATORY DATA:  I have reviewed the data as listed  .    Latest Ref Rng & Units 01/06/2024    8:25 AM 09/19/2023    8:01 AM 08/24/2023    8:08 AM  CBC  WBC 4.0 - 10.5 K/uL 15.5  28.5    Hemoglobin 12.0 - 15.0 g/dL 09.8  11.9  14.7   Hematocrit 36.0 - 46.0 % 40.6  42.7  38.0   Platelets 150 - 400 K/uL 135  161     CBC    Component Value Date/Time   WBC 15.5 (H) 01/06/2024 0825   WBC 20.4 (H) 06/19/2023 0818   RBC 4.55 01/06/2024 0825   HGB 12.6 01/06/2024 0825   HGB 13.4 09/19/2023 0801   HCT 40.6 01/06/2024 0825   HCT 42.7 09/19/2023 0801   PLT 135 (L) 01/06/2024 0825   PLT 161 09/19/2023 0801   MCV 89.2 01/06/2024 0825  MCV 92 09/19/2023 0801   MCH 27.7 01/06/2024 0825   MCHC 31.0 01/06/2024 0825   RDW 13.6 01/06/2024 0825   RDW 13.3 09/19/2023 0801   LYMPHSABS 10.8 (H) 01/06/2024 0825   LYMPHSABS 19.6 (H) 12/30/2022 0907   MONOABS 1.2 (H) 01/06/2024 0825   EOSABS 0.3 01/06/2024 0825   EOSABS 0.2 12/30/2022 0907   BASOSABS 0.1 01/06/2024 0825   BASOSABS 0.1 12/30/2022 0907       Latest Ref Rng & Units 01/06/2024    8:25 AM 09/19/2023    8:04 AM 08/24/2023    8:08 AM  CMP  Glucose 70 - 99 mg/dL 161  096  045   BUN 8 - 23 mg/dL 40  33  29   Creatinine 0.44 - 1.00 mg/dL 4.09  8.11  9.14   Sodium 135 - 145 mmol/L 140  141  141   Potassium 3.5 - 5.1 mmol/L 4.4  5.2  4.4   Chloride 98 - 111 mmol/L 108   102  106   CO2 22 - 32 mmol/L 27  23    Calcium 8.9 - 10.3 mg/dL 9.1  9.5    Total Protein 6.5 - 8.1 g/dL 5.8     Total Bilirubin 0.0 - 1.2 mg/dL 0.5     Alkaline Phos 38 - 126 U/L 83     AST 15 - 41 U/L 13     ALT 0 - 44 U/L 7      Lab Results  Component Value Date   LDH 157 01/06/2024      12/15/17 FISH CLL Prognostic Panel:    01/03/19 Left axillary LN biopsy:    RADIOGRAPHIC STUDIES: I have personally reviewed the radiological images as listed and agreed with the findings in the report. MM 3D DIAGNOSTIC MAMMOGRAM UNILATERAL LEFT BREAST Result Date: 12/24/2023 CLINICAL DATA:  Recall from screening to evaluate a possible left breast asymmetry. EXAM: DIGITAL DIAGNOSTIC UNILATERAL LEFT MAMMOGRAM WITH TOMOSYNTHESIS AND CAD TECHNIQUE: Left digital diagnostic mammography and breast tomosynthesis was performed. The images were evaluated with computer-aided detection. COMPARISON:  Previous exam(s). ACR Breast Density Category b: There are scattered areas of fibroglandular density. FINDINGS: Additional views of the left breast were obtained including a repeat full field MLO view. There is no focal abnormality over the mid to lower left breast. The questionable screening asymmetry is due to overlapping fibroglandular tissue. IMPRESSION: No focal abnormality of the mid to lower left breast. RECOMMENDATION: Recommend continued annual bilateral screening mammographic follow-up. I have discussed the findings and recommendations with the patient. If applicable, a reminder letter will be sent to the patient regarding the next appointment. BI-RADS CATEGORY  1: Negative. Electronically Signed   By: Elberta Fortis M.D.   On: 12/24/2023 08:13    ASSESSMENT & PLAN:   Brenda [name] is a 73 y.o. 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 Lab Results  Component Value Date   LDH 147 07/08/2023   05/01/18 CT C/A/P revealed Mildly progressive lymphadenopathy in the chest, abdomen, and pelvis, corresponding to the patient's known CLL, as above. Dominant left external iliac node measures 3.3 cm short axis, previously 2.8cm. Spleen is normal in size. Numerous bilateral pulmonary nodules measuring up to 9 mm, grossly unchanged from 2017. Prior bilateral pleural effusions have  resolved.   09/22/18 [place] Bilateral Axilla revealed Ultrasound is performed, showing numerous enlarged LEFT axillary lymph nodes. Largest lymph node in the LEFT axilla has cortical thickening of 9 millimeters. Largest lymph node is 3.3 centimeters in length. Evaluation of the contralateral axilla for comparison demonstrate lymph nodes with cortical thickening. Largest lymph node in the RIGHT axilla is 3.4 x 1.3 centimeters.  01/03/19 Left axillary LN biopsy revealed CLL  2. Multiple pulmonary nodules   CT AP on 11/17/17 - with 1. Pelvic lymphadenopathy and innumerable pulmonary nodules in the lung bases, consistent with metastatic disease. 2. Cholelithiasis without CT evidence of acute cholecystitis. 3.  Aortic atherosclerosis  CT Chest on 11/20/17 - with multiple small lung nodules in LLL that appear stable from prior CT in 12/2015 and are considered benign and Upper lung nodules also appear benign. Also with several borderline enlarged lymph nodes concerning for a lymphoproliferative disorder   3. H/o endometrial cancer in 1998 -localized to uterus -treated with surgery, abdominal hysterectomy  -no evidence of recurrence  4. Microcytic anemia - Fe deficiency  Presented in hospital with Hgb of 6.6 Hgb holding steady s/p 2U PRBC - Fe infusion completed 1/19 - stool hemoccult negative - suspect this may be due to CKD, but iron deficiency also worrisome for occult GIB  -Tolerated IV iron well  (hgb  stable today).  5. Marginal B12 levels in hospital  (Antiparietal celll and anti IF Ab negative) -given one B12 injection in hospital -on replacement  PLAN:   -Discussed lab results on 01/06/24 in detail with patient. CBC showed WBC of 15.5K, hemoglobin of 12.6, and platelets of 135K. -WBC improved from 28.5 three months ago to 15.5 currently.  -hgb normal -platelets stable -CMP shows improved kidney function. Creatinine improved from 1.75 mg/dL 3 months ago to 1.61 mg/dL currently.  -I did feel some very small unchanged lymph nodes in her neck. Did not feel any significant lymph nodes during physical examination.  -Patient has no clinical evidence of significant CLL progression at this time causing symptomatic disease. -Patient has no clear indication to initiate CLL treatment at this time -encouraged patient to continue engagement in activities she enjoys to ensure she does not become socially isolated -discussed option of connecting with a counselor or pastoral counseling -discussed option of independent living communities or senior housing -discussed option of connecting with a Child psychotherapist -answered all of patient's questions in detail -patient shall return to clinic in 6 months  FOLLOW UP:  RTC with Dr Candise Che with labs in 6 months  The total time spent in the appointment was 21 minutes* .  All of the patient's questions were answered with apparent satisfaction. The patient knows to call the clinic with any problems, questions or concerns.   Wyvonnia Lora MD MS AAHIVMS [facility] Hematology/Oncology Physician [facility]  .*Total Encounter Time as defined by the Centers for Medicare and Medicaid Services includes, in addition to the face-to-face time of a patient visit (documented in the note above) non-face-to-face time: obtaining and reviewing outside history, ordering and reviewing medications, tests or procedures, care coordination (communications with other health  care professionals or caregivers) and documentation in the medical record.    I,Mitra Faeizi,acting as a Neurosurgeon for Wyvonnia Lora, MD.,have documented all relevant documentation on the behalf of Wyvonnia Lora, MD,as directed by  Wyvonnia Lora, MD while in the presence of Wyvonnia Lora, MD.  .I have reviewed the above documentation for accuracy and completeness, and I agree with  the above. Johney Maine MD

## 2024-01-08 NOTE — Progress Notes (Unsigned)
  Electrophysiology Office Note:   Date:  01/08/2024  ID:  Brenda [name], Brenda [name] December 02, 1950, MRN [id number]  Primary Cardiologist: Julien Nordmann, MD Electrophysiologist: Maurice Small, MD  {Click to update primary MD,subspecialty MD or APP then REFRESH:1}    History of Present Illness:   Brenda [name] is a 73 y.o. female with h/o CAD, DM2, CLL, HTN, CKD III, and persistent fib  seen today for routine electrophysiology followup.   Underwent ablations as below, mostly recently re-do with multiple other foci in 10/2023  Since last being seen in our clinic the patient reports doing ***.  she denies chest pain, palpitations, dyspnea, PND, orthopnea, nausea, vomiting, dizziness, syncope, edema, weight gain, or early satiety.   Review of systems complete and found to be negative unless listed in HPI.   EP Information / Studies Reviewed:    EKG is ordered today. Personal review as below.       Arrhythmia/Device History S/p PVI 12/2022 S/p redo PVI, posterior, Anterior Mitral isthmus ablation, and CTI 10/2023   Physical Exam:   VS:  There were no vitals taken for this visit.   Wt Readings from Last 3 Encounters:  01/06/24 167 lb (75.8 kg)  12/20/23 167 lb 6.4 oz (75.9 kg)  11/16/23 171 lb (77.6 kg)     GEN: No acute distress NECK: No JVD; No carotid bruits CARDIAC: {EPRHYTHM:28826}, no murmurs, rubs, gallops RESPIRATORY:  Clear to auscultation without rales, wheezing or rhonchi  ABDOMEN: Soft, non-tender, non-distended EXTREMITIES:  {EDEMA LEVEL:28147::"No"} edema; No deformity   ASSESSMENT AND PLAN:    Persistent atrial fibrillation Atrial flutter S/p ablation Mar and Dec 2024 EKG today shows *** Poor AAD candidate given CKDIII+ Continue Eliquis 5 mg BID for CHA2DS2VASc of at least 5  Secondary hypercoagulable state Pt on Eliquis as above    HTN Stable on current regimen   CKDIIIb Follow  {Click here to Review PMH, Prob List, Meds, Allergies, SHx, FHx  :1}    Follow up with {WUJWJ:19147} {EPFOLLOW WG:95621}  Signed, Graciella Freer, PA-C

## 2024-01-09 ENCOUNTER — Encounter: Payer: Self-pay | Admitting: Student

## 2024-01-09 ENCOUNTER — Ambulatory Visit: Payer: Medicare Other | Attending: Student | Admitting: Student

## 2024-01-09 ENCOUNTER — Ambulatory Visit: Payer: Medicare Other | Admitting: Student

## 2024-01-09 ENCOUNTER — Telehealth: Payer: Self-pay | Admitting: Cardiovascular Disease

## 2024-01-09 VITALS — BP 108/56 | HR 72 | Ht 60.0 in | Wt 169.0 lb

## 2024-01-09 DIAGNOSIS — I4891 Unspecified atrial fibrillation: Secondary | ICD-10-CM | POA: Diagnosis not present

## 2024-01-09 DIAGNOSIS — D6869 Other thrombophilia: Secondary | ICD-10-CM | POA: Diagnosis not present

## 2024-01-09 DIAGNOSIS — N1832 Chronic kidney disease, stage 3b: Secondary | ICD-10-CM | POA: Diagnosis present

## 2024-01-09 DIAGNOSIS — I4819 Other persistent atrial fibrillation: Secondary | ICD-10-CM | POA: Insufficient documentation

## 2024-01-09 NOTE — Patient Instructions (Signed)
Medication Instructions:  Your physician recommends that you continue on your current medications as directed. Please refer to the Current Medication list given to you today.  *If you need a refill on your cardiac medications before your next appointment, please call your pharmacy*  Lab Work: None ordered If you have labs (blood work) drawn today and your tests are completely normal, you will receive your results only by: MyChart Message (if you have MyChart) OR A paper copy in the mail If you have any lab test that is abnormal or we need to change your treatment, we will call you to review the results.  Follow-Up: At [facility], you and your health needs are our priority.  As part of our continuing mission to provide you with exceptional heart care, we have created designated Provider Care Teams.  These Care Teams include your primary Cardiologist (physician) and Advanced Practice Providers (APPs -  Physician Assistants and Nurse Practitioners) who all work together to provide you with the care you need, when you need it.  Your next appointment:   6 month(s)  Provider:   York Pellant, MD or Brenda [name]" Brenda [name], [name]

## 2024-01-09 NOTE — Telephone Encounter (Signed)
 Left voice mail

## 2024-01-09 NOTE — Telephone Encounter (Signed)
 Left voicemail, pt needs appt scheduled from recall

## 2024-01-10 ENCOUNTER — Encounter: Payer: Self-pay | Admitting: Cardiovascular Disease

## 2024-01-10 MED ORDER — LISINOPRIL 20 MG PO TABS: 20.0000 mg | ORAL_TABLET | Freq: Two times a day (BID) | ORAL | 0 refills | Status: DC

## 2024-01-10 NOTE — Telephone Encounter (Signed)
Pt scheduled on 5/13

## 2024-01-26 ENCOUNTER — Telehealth (HOSPITAL_COMMUNITY): Payer: Self-pay | Admitting: *Deleted

## 2024-01-26 NOTE — Telephone Encounter (Signed)
 Patient called in stating for the past week her HR has consistently been elevated in the 100-120s. Her BP cuff is not indicating she is in AFib. She does feel more short of breath with activity but is currently under stress related to preparing her home for sale. BP is stable in the 110s/60s.  Discussed with Jorja Loa PA will try increasing metoprolol to 25mg  BID and follow up with response tomorrow. If remains elevated will plan to bring into office for assessment next week. Pt in agreement.

## 2024-01-27 NOTE — Telephone Encounter (Signed)
 Pt heart rates 100-105 currently on higher dose of metoprolol. Appt made for Monday.

## 2024-01-30 ENCOUNTER — Ambulatory Visit (HOSPITAL_COMMUNITY)
Admission: RE | Admit: 2024-01-30 | Discharge: 2024-01-30 | Disposition: A | Discharge status: Home / Self Care | Source: Ambulatory Visit | Attending: Physician Assistant | Admitting: Physician Assistant

## 2024-01-30 ENCOUNTER — Encounter (HOSPITAL_COMMUNITY): Payer: Self-pay | Admitting: Physician Assistant

## 2024-01-30 VITALS — BP 110/70 | HR 87 | Ht 60.0 in | Wt 171.0 lb

## 2024-01-30 DIAGNOSIS — E1122 Type 2 diabetes mellitus with diabetic chronic kidney disease: Secondary | ICD-10-CM | POA: Insufficient documentation

## 2024-01-30 DIAGNOSIS — Z7901 Long term (current) use of anticoagulants: Secondary | ICD-10-CM | POA: Diagnosis not present

## 2024-01-30 DIAGNOSIS — I129 Hypertensive chronic kidney disease with stage 1 through stage 4 chronic kidney disease, or unspecified chronic kidney disease: Secondary | ICD-10-CM | POA: Diagnosis not present

## 2024-01-30 DIAGNOSIS — N184 Chronic kidney disease, stage 4 (severe): Secondary | ICD-10-CM | POA: Diagnosis not present

## 2024-01-30 DIAGNOSIS — I4819 Other persistent atrial fibrillation: Secondary | ICD-10-CM | POA: Diagnosis not present

## 2024-01-30 DIAGNOSIS — D6869 Other thrombophilia: Secondary | ICD-10-CM

## 2024-01-30 DIAGNOSIS — I4892 Unspecified atrial flutter: Secondary | ICD-10-CM | POA: Insufficient documentation

## 2024-01-30 DIAGNOSIS — C9110 Chronic lymphocytic leukemia of B-cell type not having achieved remission: Secondary | ICD-10-CM | POA: Insufficient documentation

## 2024-01-30 DIAGNOSIS — I2510 Atherosclerotic heart disease of native coronary artery without angina pectoris: Secondary | ICD-10-CM | POA: Diagnosis not present

## 2024-01-30 NOTE — Patient Instructions (Addendum)
 Hold Jardiance Tuesday, Wednesday and Thursday  Cardioversion scheduled for: April 4th 2025   - Arrive at the [facility] and go to admitting at 12:30 pm   - Do not eat or drink anything after midnight the night prior to your procedure.   - Take all your morning medication (except diabetic medications) with a sip of water prior to arrival.  - You will not be able to drive home after your procedure.    - Do NOT miss any doses of your blood thinner - if you should miss a dose please notify our office immediately.   - If you feel as if you go back into normal rhythm prior to scheduled cardioversion, please notify our office immediately.   If your procedure is canceled in the cardioversion suite you will be charged a cancellation fee.    Hold below medications 7 days prior to scheduled procedure/anesthesia.  Restart medication on the normal dosing day after scheduled procedure/anesthesia  Dulaglutide (Trulicity) Exenatide extended release (Bydureon bcise) Semaglutide (Ozempic) (WEGOVY)  Tirzepatide (Mounjaro)     Hold below medications 72 hours prior to scheduled procedure/anesthesia. Restart medication on the following day after scheduled procedure/anesthesia Bexagliflozin (Brenzavvy) Canagliflozin (Invokana) Canagliflozin/metformin (Invokamet/Invokamet XR) Dapagliflozin Marcelline Deist) Dapagliflozin/metformin (Xigduo XR) Dapagliflozin/saxagliptin Colbert Coyer) Empagliflozin (Jardiance) Empagliflozin/linagliptin (Glyxambi) Empagliflozin/linagliptin/metformin (Trijardy XR) Empagliflozin/metformin (Synjardy/Synjardy XR) Ertugliflozin (Steglatro) Ertugliflozin/metformin (Segluromet) Ertugliflozin/sitagliptin (Steglujan)    Hold below medications 24 hours prior to scheduled procedure/anesthesia.   Restart medication on the following day after scheduled procedure/anesthesia   Exenatide (Byetta)  Liraglutide (Victoza, Saxenda)  Lixisenatide (Adlyxin)  Semaglutide  (Rybelsus) Polyethylene Glycol Loxenatide        For those patients who have a scheduled procedure/anesthesia on the same day of the week as their dose, hold the medication on the day of surgery.  They can take their scheduled dose the week before.  **Patients on the above medications scheduled for elective procedures that have not held the medication for the appropriate amount of time are at risk of cancellation or change in the anesthetic plan.

## 2024-01-30 NOTE — H&P (View-Only) (Signed)
 Primary Care Physician: Myrlene Broker, MD Primary Cardiologist: Dr. Mariah Milling Primary Electrophysiologist: Dr. Nelly Laurence Referring Physician: Dr Nelly Laurence    Brenda [name] is a 73 y.o. female with a history of CAD, DM2, CLL, HTN, CKD IIIb-IV, and persistent atrial fibrillation who presents for follow up in the [facility]. The patient was initially diagnosed with atrial fibrillation in May 2022 and placed on metoprolol and Eliquis. She was started on amiodarone in June 2023. September 2023 cardiac monitor for lightheadedness and dizziness showed 27% AF burden and multiple pauses with longest lasting 8 seconds. Her carvedilol and amiodarone were discontinued. Follow up monitor did not show any significant pauses. She was seen by Cardiology on 10/14/22 for palpitations having coincided 2 days prior with initiation of levothyroxine. She was noted to be in Afib with RVR. She was sent to the ED and underwent successful cardioversion. Seen by Dr. Nelly Laurence 10/18/22 and after discussion agreed to proceed with ablation. She is now s/p Afib ablation on 01/14/23. Patient is on Eliquis 5 mg BID for stroke prevention.   In September 2023, she complained of lightheadedness and dizziness. A Zio patch was placed that showed sinus pauses up to about 8 seconds, so her Coreg and amiodarone were discontinued. Additionally, her TSH was elevated at 11.7. Pacemaker placement was deferred with a reversible cause. Monitor placed and follow-up did not show any atrial fibrillation or significant bradycardia. She did undergo DCCV on 06/19/23 in the ED and then again as an outpatient on 08/24/23.   Patient is s/p repeat afib and flutter ablation 10/11/23.   Patient returns for follow up for atrial fibrillation. She noted an elevated heart rate on 3/25 on her home BP machine. Her rates were 100-120s bpm. Her BB was increased which did help to lower her heart rates. She is in afib today. There were no  specific triggers that she could identify. No bleeding issues on anticoagulation.   Today, she  denies symptoms of shortness of breath, orthopnea, PND, lower extremity edema, dizziness, presyncope, syncope, snoring, daytime somnolence, bleeding, or neurologic sequela. The patient is tolerating medications without difficulties and is otherwise without complaint today.     Atrial Fibrillation Risk Factors:  she does not have a history of rheumatic fever. she does not have a history of alcohol use. The patient does not have a history of early familial atrial fibrillation or other arrhythmias.   Atrial Fibrillation Management history:  Previous antiarrhythmic drugs: amiodarone Previous cardioversions: 10/14/22, 06/19/23, 08/24/23 Previous ablations: 01/14/23 Anticoagulation history: Eliquis 5 mg BID   Past Medical History:  Diagnosis Date   Anxiety    Arthritis    Atrial fibrillation (HCC)    CLL (chronic lymphocytic leukemia) (HCC)    Dx. 2019 Dr. Candise Che'   Depression    Wellbutrin   Diabetes mellitus    Type II   Endometrial ca 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 Sebastian Ache first visit 11-2019     Current Outpatient Medications  Medication Sig Dispense Refill   acetaminophen (TYLENOL) 500 MG tablet Take 500-1,000 mg  by mouth every 6 (six) hours as needed for mild pain, moderate pain or fever.      ALPRAZolam (XANAX) 1 MG tablet Take 1 tablet (1 mg total) by mouth 2 (two) times daily as needed for anxiety. 60 tablet 5   amoxicillin (AMOXIL) 500 MG capsule Take 2,000 mg by mouth See admin instructions. 1 hour before dental appointment     Blood Glucose Monitoring Suppl (ONETOUCH VERIO) w/Device KIT Use as advised 1 kit 0   buPROPion (WELLBUTRIN XL) 300 MG 24 hr tablet TAKE 1 TABLET(300 MG) BY MOUTH DAILY 90 tablet 3   Cholecalciferol (VITAMIN D3) 2000 units TABS Take 2,000 mcg by mouth daily.     Cyanocobalamin (B-12) 1000 MCG SUBL Place 1,000 mcg under the tongue every evening.     ELIQUIS 5 MG TABS tablet TAKE 1 TABLET(5 MG) BY MOUTH TWICE DAILY 60 tablet 5   esomeprazole (NEXIUM) 20 MG capsule Take 20 mg by mouth daily.     ezetimibe (ZETIA) 10 MG tablet Take 1 tablet (10 mg total) by mouth daily. PLEASE CALL [phone] TO SCHEDULE FOLLOW UP APPOINTMENT PRIOR TO NEXT REFILL REQUEST. THANK YOU. 90 tablet 0   glucose blood (ONETOUCH VERIO) test strip USE TO TEST BLOOD GLUCOSE THREE TIMES DAILY. 300 strip 5   insulin glargine (LANTUS SOLOSTAR) 100 UNIT/ML Solostar Pen Inject under skin 20-24 units after dinner 30 mL 3   Insulin Pen Needle (BD PEN NEEDLE NANO 2ND GEN) 32G X 4 MM MISC Use to inject insulin daily 100 each 3   iron polysaccharides (NIFEREX) 150 MG capsule Take 150 mg by mouth daily.     JARDIANCE 10 MG TABS tablet TAKE 1 TABLET(10 MG) BY MOUTH DAILY BEFORE BREAKFAST 30 tablet 11   Lancet Devices (LANCING DEVICE) MISC Use as advised - Freestyle 1 each 0   Lancets (ONETOUCH DELICA PLUS LANCET30G) MISC USE TO TEST BLOOD GLUCOSE THREE TIMES DAILY AS DIRECTED 300 each 1   lisinopril (ZESTRIL) 20 MG tablet Take 1 tablet (20 mg total) by mouth in the morning and at bedtime. 180 tablet 0   metoprolol succinate (TOPROL XL) 25 MG 24 hr tablet Take 1 tablet (25 mg total) by mouth daily. Take with or  immediately following a meal. (Patient taking differently: Take 25 mg by mouth 2 (two) times daily. Take with or immediately following a meal.) 30 tablet 11   NONFORMULARY OR COMPOUNDED ITEM Apply 1 application  topically 2 (two) times daily as needed (sun damage on face). FLUOROURACIL 5% + CALCIPOTRIENE 0.005%     simvastatin (ZOCOR) 40 MG tablet TAKE 1 TABLET(40 MG) BY MOUTH AT BEDTIME. PLEASE CALL [phone] TO SCHEDULE FOLLOW UP APPOINTMENT PRIOR TO NEXT REFILL REQUEST. THANK YOU. 90 tablet 0   doxazosin (CARDURA) 2 MG tablet TAKE 1 TABLET(2 MG) BY MOUTH EVERY EVENING (Patient not taking: Reported on 01/30/2024) 90 tablet 3   No current facility-administered medications for this encounter.    ROS- All systems are reviewed and negative except as per the HPI above.  Physical Exam: Vitals:  01/30/24 0831  BP: 110/70  Pulse: 87  Weight: 77.6 kg  Height: 5' (1.524 m)     GEN: Well nourished, well developed in no acute distress NECK: No JVD; No carotid bruits CARDIAC: Irregularly irregular rate and rhythm, no murmurs, rubs, gallops RESPIRATORY:  Clear to auscultation without rales, wheezing or rhonchi  ABDOMEN: Soft, non-tender, non-distended EXTREMITIES:  No edema; No deformity    Wt Readings from Last 3 Encounters:  01/30/24 77.6 kg  01/09/24 76.7 kg  01/06/24 75.8 kg    EKG today demonstrates  Afib Vent. rate 87 BPM PR interval * ms QRS duration 80 ms QT/QTcB 374/450 ms   Echo 05/22/21 demonstrated:  1. Left ventricular ejection fraction, by estimation, is 60 to 65%. The  left ventricle has normal function. The left ventricle has no regional  wall motion abnormalities. Left ventricular diastolic parameters were  normal.   2. Right ventricular systolic function is normal. The right ventricular  size is normal.   3. The mitral valve is normal in structure. Mild mitral valve  regurgitation.  Epic records are reviewed at length today.  CHA2DS2-VASc Score = 5  The  patient's score is based upon: CHF History: 0 HTN History: 1 Diabetes History: 1 Stroke History: 0 Vascular Disease History: 1 Age Score: 1 Gender Score: 1       ASSESSMENT AND PLAN: Persistent Atrial Fibrillation/atrial flutter The patient's CHA2DS2-VASc score is 5, indicating a 7.2% annual risk of stroke.   S/p afib ablation 01/17/23, afib and flutter ablation 10/11/23 Not felt a candidate for most AAD due to renal function. Previously failed amiodarone due to pauses and thyroid dysfunction.  She is in rate controlled afib today. We discussed rhythm control options. Will plan for DCCV.  Continue Eliquis 5 mg BID, she denies any missed doses in the past 3 weeks.  Continue Toprol 50 mg daily until DCCV then decrease back to 25 mg daily.  Hold Jardiance 3 days prior to DCCV.  Continue PRN diltiazem and propranolol.   Secondary Hypercoagulable State (ICD10:  D68.69) The patient is at significant risk for stroke/thromboembolism based upon her CHA2DS2-VASc Score of 5.  Continue Apixaban (Eliquis). No bleeding issues.  HTN Stable on current regimen   Follow up in the AF clinic post DCCV.    Informed Consent   Shared Decision Making/Informed Consent The risks (stroke, cardiac arrhythmias rarely resulting in the need for a temporary or permanent pacemaker, skin irritation or burns and complications associated with conscious sedation including aspiration, arrhythmia, respiratory failure and death), benefits (restoration of normal sinus rhythm) and alternatives of a direct current cardioversion were explained in detail to Ms. Mckeever and she agrees to proceed.       Jorja Loa PA-C Af[facility] [address] [place], [place] [address] [phone] 01/30/2024 8:59 AM

## 2024-01-30 NOTE — Progress Notes (Signed)
 Primary Care Physician: Myrlene Broker, MD Primary Cardiologist: Dr. Mariah Milling Primary Electrophysiologist: Dr. Nelly Laurence Referring Physician: Dr Nelly Laurence    Brenda [name] is a 73 y.o. female with a history of CAD, DM2, CLL, HTN, CKD IIIb-IV, and persistent atrial fibrillation who presents for follow up in the [facility]. The patient was initially diagnosed with atrial fibrillation in May 2022 and placed on metoprolol and Eliquis. She was started on amiodarone in June 2023. September 2023 cardiac monitor for lightheadedness and dizziness showed 27% AF burden and multiple pauses with longest lasting 8 seconds. Her carvedilol and amiodarone were discontinued. Follow up monitor did not show any significant pauses. She was seen by Cardiology on 10/14/22 for palpitations having coincided 2 days prior with initiation of levothyroxine. She was noted to be in Afib with RVR. She was sent to the ED and underwent successful cardioversion. Seen by Dr. Nelly Laurence 10/18/22 and after discussion agreed to proceed with ablation. She is now s/p Afib ablation on 01/14/23. Patient is on Eliquis 5 mg BID for stroke prevention.   In September 2023, she complained of lightheadedness and dizziness. A Zio patch was placed that showed sinus pauses up to about 8 seconds, so her Coreg and amiodarone were discontinued. Additionally, her TSH was elevated at 11.7. Pacemaker placement was deferred with a reversible cause. Monitor placed and follow-up did not show any atrial fibrillation or significant bradycardia. She did undergo DCCV on 06/19/23 in the ED and then again as an outpatient on 08/24/23.   Patient is s/p repeat afib and flutter ablation 10/11/23.   Patient returns for follow up for atrial fibrillation. She noted an elevated heart rate on 3/25 on her home BP machine. Her rates were 100-120s bpm. Her BB was increased which did help to lower her heart rates. She is in afib today. There were no  specific triggers that she could identify. No bleeding issues on anticoagulation.   Today, she  denies symptoms of shortness of breath, orthopnea, PND, lower extremity edema, dizziness, presyncope, syncope, snoring, daytime somnolence, bleeding, or neurologic sequela. The patient is tolerating medications without difficulties and is otherwise without complaint today.     Atrial Fibrillation Risk Factors:  she does not have a history of rheumatic fever. she does not have a history of alcohol use. The patient does not have a history of early familial atrial fibrillation or other arrhythmias.   Atrial Fibrillation Management history:  Previous antiarrhythmic drugs: amiodarone Previous cardioversions: 10/14/22, 06/19/23, 08/24/23 Previous ablations: 01/14/23 Anticoagulation history: Eliquis 5 mg BID   Past Medical History:  Diagnosis Date   Anxiety    Arthritis    Atrial fibrillation (HCC)    CLL (chronic lymphocytic leukemia) (HCC)    Dx. 2019 Dr. Candise Che'   Depression    Wellbutrin   Diabetes mellitus    Type II   Endometrial ca 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 Sebastian Ache first visit 11-2019     Current Outpatient Medications  Medication Sig Dispense Refill   acetaminophen (TYLENOL) 500 MG tablet Take 500-1,000 mg  by mouth every 6 (six) hours as needed for mild pain, moderate pain or fever.      ALPRAZolam (XANAX) 1 MG tablet Take 1 tablet (1 mg total) by mouth 2 (two) times daily as needed for anxiety. 60 tablet 5   amoxicillin (AMOXIL) 500 MG capsule Take 2,000 mg by mouth See admin instructions. 1 hour before dental appointment     Blood Glucose Monitoring Suppl (ONETOUCH VERIO) w/Device KIT Use as advised 1 kit 0   buPROPion (WELLBUTRIN XL) 300 MG 24 hr tablet TAKE 1 TABLET(300 MG) BY MOUTH DAILY 90 tablet 3   Cholecalciferol (VITAMIN D3) 2000 units TABS Take 2,000 mcg by mouth daily.     Cyanocobalamin (B-12) 1000 MCG SUBL Place 1,000 mcg under the tongue every evening.     ELIQUIS 5 MG TABS tablet TAKE 1 TABLET(5 MG) BY MOUTH TWICE DAILY 60 tablet 5   esomeprazole (NEXIUM) 20 MG capsule Take 20 mg by mouth daily.     ezetimibe (ZETIA) 10 MG tablet Take 1 tablet (10 mg total) by mouth daily. PLEASE CALL [phone] TO SCHEDULE FOLLOW UP APPOINTMENT PRIOR TO NEXT REFILL REQUEST. THANK YOU. 90 tablet 0   glucose blood (ONETOUCH VERIO) test strip USE TO TEST BLOOD GLUCOSE THREE TIMES DAILY. 300 strip 5   insulin glargine (LANTUS SOLOSTAR) 100 UNIT/ML Solostar Pen Inject under skin 20-24 units after dinner 30 mL 3   Insulin Pen Needle (BD PEN NEEDLE NANO 2ND GEN) 32G X 4 MM MISC Use to inject insulin daily 100 each 3   iron polysaccharides (NIFEREX) 150 MG capsule Take 150 mg by mouth daily.     JARDIANCE 10 MG TABS tablet TAKE 1 TABLET(10 MG) BY MOUTH DAILY BEFORE BREAKFAST 30 tablet 11   Lancet Devices (LANCING DEVICE) MISC Use as advised - Freestyle 1 each 0   Lancets (ONETOUCH DELICA PLUS LANCET30G) MISC USE TO TEST BLOOD GLUCOSE THREE TIMES DAILY AS DIRECTED 300 each 1   lisinopril (ZESTRIL) 20 MG tablet Take 1 tablet (20 mg total) by mouth in the morning and at bedtime. 180 tablet 0   metoprolol succinate (TOPROL XL) 25 MG 24 hr tablet Take 1 tablet (25 mg total) by mouth daily. Take with or  immediately following a meal. (Patient taking differently: Take 25 mg by mouth 2 (two) times daily. Take with or immediately following a meal.) 30 tablet 11   NONFORMULARY OR COMPOUNDED ITEM Apply 1 application  topically 2 (two) times daily as needed (sun damage on face). FLUOROURACIL 5% + CALCIPOTRIENE 0.005%     simvastatin (ZOCOR) 40 MG tablet TAKE 1 TABLET(40 MG) BY MOUTH AT BEDTIME. PLEASE CALL [phone] TO SCHEDULE FOLLOW UP APPOINTMENT PRIOR TO NEXT REFILL REQUEST. THANK YOU. 90 tablet 0   doxazosin (CARDURA) 2 MG tablet TAKE 1 TABLET(2 MG) BY MOUTH EVERY EVENING (Patient not taking: Reported on 01/30/2024) 90 tablet 3   No current facility-administered medications for this encounter.    ROS- All systems are reviewed and negative except as per the HPI above.  Physical Exam: Vitals:  01/30/24 0831  BP: 110/70  Pulse: 87  Weight: 77.6 kg  Height: 5' (1.524 m)     GEN: Well nourished, well developed in no acute distress NECK: No JVD; No carotid bruits CARDIAC: Irregularly irregular rate and rhythm, no murmurs, rubs, gallops RESPIRATORY:  Clear to auscultation without rales, wheezing or rhonchi  ABDOMEN: Soft, non-tender, non-distended EXTREMITIES:  No edema; No deformity    Wt Readings from Last 3 Encounters:  01/30/24 77.6 kg  01/09/24 76.7 kg  01/06/24 75.8 kg    EKG today demonstrates  Afib Vent. rate 87 BPM PR interval * ms QRS duration 80 ms QT/QTcB 374/450 ms   Echo 05/22/21 demonstrated:  1. Left ventricular ejection fraction, by estimation, is 60 to 65%. The  left ventricle has normal function. The left ventricle has no regional  wall motion abnormalities. Left ventricular diastolic parameters were  normal.   2. Right ventricular systolic function is normal. The right ventricular  size is normal.   3. The mitral valve is normal in structure. Mild mitral valve  regurgitation.  Epic records are reviewed at length today.  CHA2DS2-VASc Score = 5  The  patient's score is based upon: CHF History: 0 HTN History: 1 Diabetes History: 1 Stroke History: 0 Vascular Disease History: 1 Age Score: 1 Gender Score: 1       ASSESSMENT AND PLAN: Persistent Atrial Fibrillation/atrial flutter The patient's CHA2DS2-VASc score is 5, indicating a 7.2% annual risk of stroke.   S/p afib ablation 01/17/23, afib and flutter ablation 10/11/23 Not felt a candidate for most AAD due to renal function. Previously failed amiodarone due to pauses and thyroid dysfunction.  She is in rate controlled afib today. We discussed rhythm control options. Will plan for DCCV.  Continue Eliquis 5 mg BID, she denies any missed doses in the past 3 weeks.  Continue Toprol 50 mg daily until DCCV then decrease back to 25 mg daily.  Hold Jardiance 3 days prior to DCCV.  Continue PRN diltiazem and propranolol.   Secondary Hypercoagulable State (ICD10:  D68.69) The patient is at significant risk for stroke/thromboembolism based upon her CHA2DS2-VASc Score of 5.  Continue Apixaban (Eliquis). No bleeding issues.  HTN Stable on current regimen   Follow up in the AF clinic post DCCV.    Informed Consent   Shared Decision Making/Informed Consent The risks (stroke, cardiac arrhythmias rarely resulting in the need for a temporary or permanent pacemaker, skin irritation or burns and complications associated with conscious sedation including aspiration, arrhythmia, respiratory failure and death), benefits (restoration of normal sinus rhythm) and alternatives of a direct current cardioversion were explained in detail to Brenda [name] and she agrees to proceed.       Jorja Loa PA-C Af[facility] [address] [place], [place] [address] [phone] 01/30/2024 8:59 AM

## 2024-01-30 NOTE — Addendum Note (Signed)
 Encounter addended by: Shona Simpson, RN on: 01/30/2024 10:23 AM  Actions taken: New alternative orders accepted, Order list changed, Diagnosis association updated

## 2024-02-02 NOTE — Progress Notes (Signed)
 Spoke to patient and instructed them to come at 12:30  and to be NPO after 0000.  Medications reviewed.    Confirmed that patient will have a ride home and someone to stay with them for 24 hours after the procedure.

## 2024-02-03 ENCOUNTER — Ambulatory Visit (HOSPITAL_COMMUNITY): Payer: Self-pay

## 2024-02-03 ENCOUNTER — Ambulatory Visit (HOSPITAL_COMMUNITY)
Admission: RE | Admit: 2024-02-03 | Discharge: 2024-02-03 | Disposition: A | Discharge status: Home / Self Care | Attending: Cardiovascular Disease | Admitting: Cardiovascular Disease

## 2024-02-03 ENCOUNTER — Encounter (HOSPITAL_COMMUNITY)
Admission: RE | Disposition: A | Discharge status: Home / Self Care | Payer: Self-pay | Source: Home / Self Care | Attending: Cardiovascular Disease

## 2024-02-03 ENCOUNTER — Other Ambulatory Visit: Payer: Self-pay

## 2024-02-03 ENCOUNTER — Ambulatory Visit (HOSPITAL_BASED_OUTPATIENT_CLINIC_OR_DEPARTMENT_OTHER): Payer: Self-pay

## 2024-02-03 ENCOUNTER — Encounter (HOSPITAL_COMMUNITY): Payer: Self-pay | Admitting: Cardiovascular Disease

## 2024-02-03 DIAGNOSIS — I2510 Atherosclerotic heart disease of native coronary artery without angina pectoris: Secondary | ICD-10-CM | POA: Diagnosis not present

## 2024-02-03 DIAGNOSIS — I4891 Unspecified atrial fibrillation: Secondary | ICD-10-CM | POA: Diagnosis not present

## 2024-02-03 DIAGNOSIS — F418 Other specified anxiety disorders: Secondary | ICD-10-CM | POA: Diagnosis not present

## 2024-02-03 DIAGNOSIS — F32A Depression, unspecified: Secondary | ICD-10-CM | POA: Diagnosis not present

## 2024-02-03 DIAGNOSIS — E119 Type 2 diabetes mellitus without complications: Secondary | ICD-10-CM | POA: Diagnosis not present

## 2024-02-03 DIAGNOSIS — Z79899 Other long term (current) drug therapy: Secondary | ICD-10-CM | POA: Diagnosis not present

## 2024-02-03 DIAGNOSIS — Z006 Encounter for examination for normal comparison and control in clinical research program: Secondary | ICD-10-CM

## 2024-02-03 DIAGNOSIS — K219 Gastro-esophageal reflux disease without esophagitis: Secondary | ICD-10-CM | POA: Insufficient documentation

## 2024-02-03 DIAGNOSIS — M1990 Unspecified osteoarthritis, unspecified site: Secondary | ICD-10-CM | POA: Insufficient documentation

## 2024-02-03 DIAGNOSIS — E1122 Type 2 diabetes mellitus with diabetic chronic kidney disease: Secondary | ICD-10-CM | POA: Diagnosis not present

## 2024-02-03 DIAGNOSIS — F419 Anxiety disorder, unspecified: Secondary | ICD-10-CM | POA: Diagnosis not present

## 2024-02-03 DIAGNOSIS — D6869 Other thrombophilia: Secondary | ICD-10-CM | POA: Insufficient documentation

## 2024-02-03 DIAGNOSIS — Z794 Long term (current) use of insulin: Secondary | ICD-10-CM | POA: Diagnosis not present

## 2024-02-03 DIAGNOSIS — Z7901 Long term (current) use of anticoagulants: Secondary | ICD-10-CM | POA: Insufficient documentation

## 2024-02-03 DIAGNOSIS — Z7984 Long term (current) use of oral hypoglycemic drugs: Secondary | ICD-10-CM | POA: Diagnosis not present

## 2024-02-03 DIAGNOSIS — I129 Hypertensive chronic kidney disease with stage 1 through stage 4 chronic kidney disease, or unspecified chronic kidney disease: Secondary | ICD-10-CM | POA: Diagnosis not present

## 2024-02-03 DIAGNOSIS — I10 Essential (primary) hypertension: Secondary | ICD-10-CM | POA: Diagnosis not present

## 2024-02-03 DIAGNOSIS — N183 Chronic kidney disease, stage 3 unspecified: Secondary | ICD-10-CM | POA: Diagnosis not present

## 2024-02-03 DIAGNOSIS — I4819 Other persistent atrial fibrillation: Secondary | ICD-10-CM | POA: Diagnosis not present

## 2024-02-03 HISTORY — PX: CARDIOVERSION: EP1203

## 2024-02-03 SURGERY — CARDIOVERSION (CATH LAB)
Anesthesia: General

## 2024-02-03 MED ORDER — SODIUM CHLORIDE 0.9% FLUSH
3.0000 mL | INTRAVENOUS | Status: DC | PRN
Start: 2024-02-03 — End: 2024-02-03

## 2024-02-03 MED ORDER — SODIUM CHLORIDE 0.9% FLUSH: 3.0000 mL | Freq: Two times a day (BID) | INTRAVENOUS | Status: DC

## 2024-02-03 MED ORDER — EPHEDRINE SULFATE-NACL 50-0.9 MG/10ML-% IV SOSY
PREFILLED_SYRINGE | INTRAVENOUS | Status: DC | PRN
  Administered 2024-02-03 (×4): 10 mg via INTRAVENOUS

## 2024-02-03 MED ORDER — LIDOCAINE 2% (20 MG/ML) 5 ML SYRINGE
INTRAMUSCULAR | Status: DC | PRN
  Administered 2024-02-03: 80 mg via INTRAVENOUS

## 2024-02-03 MED ORDER — SODIUM CHLORIDE 0.9 % IV SOLN: INTRAVENOUS | Status: DC | PRN

## 2024-02-03 MED ORDER — PROPOFOL 10 MG/ML IV BOLUS
INTRAVENOUS | Status: DC | PRN
  Administered 2024-02-03: 30 mg via INTRAVENOUS
  Administered 2024-02-03: 20 mg via INTRAVENOUS

## 2024-02-03 SURGICAL SUPPLY — 1 items: PAD DEFIB RADIO PHYSIO CONN (PAD) ×1 IMPLANT

## 2024-02-03 NOTE — Research (Signed)
 [facility] Cardioversion Informed Consent   Subject Name: Brenda [name]  Subject met inclusion and exclusion criteria.  The informed consent form, study requirements and expectations were reviewed with the subject and questions and concerns were addressed prior to the signing of the consent form.  The subject verbalized understanding of the trial requirements.  The subject agreed to participate in the [facility] Cardioversion trial and signed the informed consent on 04/Apr/2025.  The informed consent was obtained prior to performance of any protocol-specific procedures for the subject.  A copy of the signed informed consent was given to the subject and a copy was placed in the subject's medical record.   Kandis Mannan [name]

## 2024-02-03 NOTE — Interval H&P Note (Signed)
 History and Physical Interval Note:  02/03/2024 10:30 AM  Brenda [name]  has presented today for surgery, with the diagnosis of AFIB.  The various methods of treatment have been discussed with the patient and family. After consideration of risks, benefits and other options for treatment, the patient has consented to  Procedure(s): CARDIOVERSION (N/A) as a surgical intervention.  The patient's history has been reviewed, patient examined, no change in status, stable for surgery.  I have reviewed the patient's chart and labs.  Questions were answered to the patient's satisfaction.     Charlton Haws

## 2024-02-03 NOTE — Transfer of Care (Signed)
 Immediate Anesthesia Transfer of Care Note  Patient: Brenda [name]  Procedure(s) Performed: CARDIOVERSION  Patient Location: PACU and Cath Lab  Anesthesia Type:MAC  Level of Consciousness: awake, alert , and oriented  Airway & Oxygen Therapy: Patient Spontanous Breathing and Patient connected to face mask oxygen  Post-op Assessment: Report given to RN and Post -op Vital signs reviewed and stable  Post vital signs: Reviewed and stable  Last Vitals:  Vitals Value Taken Time  BP    Temp    Pulse    Resp    SpO2      Last Pain:  Vitals:   02/03/24 0944  TempSrc:   PainSc: 0-No pain         Complications: There were no known notable events for this encounter.

## 2024-02-03 NOTE — CV Procedure (Signed)
 DCC: Anesthesia: Propofol ON Rx DOAC no missed doses  DCC x 1 250 J biphasic  Converted from afib rate 78 to NSR rate 56 bpm  No immediate neurologic sequelae  Charlton Haws MD [facility]

## 2024-02-03 NOTE — Anesthesia Preprocedure Evaluation (Addendum)
 Anesthesia Evaluation  Patient identified by MRN, date of birth, ID band Patient awake    Reviewed: Allergy & Precautions, NPO status , Patient's Chart, lab work & pertinent test results, reviewed documented beta blocker date and time   History of Anesthesia Complications (+) PONV and history of anesthetic complications  Airway Mallampati: II  TM Distance: >3 FB Neck ROM: Full    Dental no notable dental hx.    Pulmonary neg pulmonary ROS   Pulmonary exam normal breath sounds clear to auscultation       Cardiovascular hypertension, Pt. on home beta blockers and Pt. on medications + CAD  Normal cardiovascular exam+ dysrhythmias (eliquis) Atrial Fibrillation  Rhythm:Irregular Rate:Normal  TEE 2024 1. Left ventricular ejection fraction, by estimation, is 40 to 45%. The  left ventricle has mildly decreased function. The left ventricle  demonstrates global hypokinesis. There is mild concentric left ventricular  hypertrophy.   2. Right ventricular systolic function is low normal. The right  ventricular size is normal.   3. Left atrial size was moderately dilated. No left atrial/left atrial  appendage thrombus was detected.   4. A small pericardial effusion is present. The pericardial effusion is  lateral to the left ventricle and anterior to the right ventricle. There  is no evidence of cardiac tamponade.   5. The mitral valve is normal in structure. Trivial mitral valve  regurgitation.   6. The aortic valve is tricuspid. There is mild calcification of the  aortic valve. Aortic valve regurgitation is trivial.   7. Evidence of atrial level shunting detected by color flow Doppler.  There is a small patent foramen ovale with predominantly left to right  shunting across the atrial septum.     Neuro/Psych  PSYCHIATRIC DISORDERS Anxiety Depression    negative neurological ROS     GI/Hepatic Neg liver ROS, hiatal hernia,GERD  ,,   Endo/Other  diabetes, Type 2, Insulin Dependent    Renal/GU Renal InsufficiencyRenal disease  negative genitourinary   Musculoskeletal  (+) Arthritis ,    Abdominal   Peds  Hematology  (+) Blood dyscrasia (CLL)   Anesthesia Other Findings   Reproductive/Obstetrics                             Anesthesia Physical Anesthesia Plan  ASA: 3  Anesthesia Plan: General   Post-op Pain Management:    Induction: Intravenous  PONV Risk Score and Plan: Propofol infusion and Treatment may vary due to age or medical condition  Airway Management Planned: Natural Airway  Additional Equipment:   Intra-op Plan:   Post-operative Plan:   Informed Consent: I have reviewed the patients History and Physical, chart, labs and discussed the procedure including the risks, benefits and alternatives for the proposed anesthesia with the patient or authorized representative who has indicated his/her understanding and acceptance.     Dental advisory given  Plan Discussed with: CRNA  Anesthesia Plan Comments:        Anesthesia Quick Evaluation

## 2024-02-03 NOTE — Discharge Instructions (Signed)

## 2024-02-06 NOTE — Anesthesia Postprocedure Evaluation (Signed)
 Anesthesia Post Note  Patient: Brenda [name]  Procedure(s) Performed: CARDIOVERSION     Patient location during evaluation: Cath Lab Anesthesia Type: General Level of consciousness: awake and alert Pain management: pain level controlled Vital Signs Assessment: post-procedure vital signs reviewed and stable Respiratory status: spontaneous breathing, nonlabored ventilation, respiratory function stable and patient connected to nasal cannula oxygen Cardiovascular status: blood pressure returned to baseline and stable Postop Assessment: no apparent nausea or vomiting Anesthetic complications: no  There were no known notable events for this encounter.  Last Vitals:  Vitals:   02/03/24 1203 02/03/24 1210  BP: (!) 109/58   Pulse:  66  Resp: 12 20  Temp:    SpO2: 95% 100%    Last Pain:  Vitals:   02/03/24 1100  TempSrc:   PainSc: 0-No pain                 Halim Surrette L Shatasha Lambing

## 2024-02-10 ENCOUNTER — Ambulatory Visit (HOSPITAL_COMMUNITY)
Admission: RE | Admit: 2024-02-10 | Discharge: 2024-02-10 | Disposition: A | Discharge status: Home / Self Care | Source: Ambulatory Visit | Attending: Physician Assistant | Admitting: Physician Assistant

## 2024-02-10 ENCOUNTER — Encounter (HOSPITAL_COMMUNITY): Payer: Self-pay | Admitting: Physician Assistant

## 2024-02-10 VITALS — BP 118/62 | HR 63 | Ht 60.0 in | Wt 171.0 lb

## 2024-02-10 DIAGNOSIS — I2510 Atherosclerotic heart disease of native coronary artery without angina pectoris: Secondary | ICD-10-CM | POA: Diagnosis not present

## 2024-02-10 DIAGNOSIS — Z794 Long term (current) use of insulin: Secondary | ICD-10-CM | POA: Insufficient documentation

## 2024-02-10 DIAGNOSIS — I4819 Other persistent atrial fibrillation: Secondary | ICD-10-CM | POA: Diagnosis not present

## 2024-02-10 DIAGNOSIS — Z7901 Long term (current) use of anticoagulants: Secondary | ICD-10-CM | POA: Insufficient documentation

## 2024-02-10 DIAGNOSIS — N184 Chronic kidney disease, stage 4 (severe): Secondary | ICD-10-CM | POA: Insufficient documentation

## 2024-02-10 DIAGNOSIS — Z856 Personal history of leukemia: Secondary | ICD-10-CM | POA: Diagnosis not present

## 2024-02-10 DIAGNOSIS — I4892 Unspecified atrial flutter: Secondary | ICD-10-CM | POA: Diagnosis not present

## 2024-02-10 DIAGNOSIS — D6869 Other thrombophilia: Secondary | ICD-10-CM | POA: Insufficient documentation

## 2024-02-10 DIAGNOSIS — E1122 Type 2 diabetes mellitus with diabetic chronic kidney disease: Secondary | ICD-10-CM | POA: Diagnosis not present

## 2024-02-10 DIAGNOSIS — I129 Hypertensive chronic kidney disease with stage 1 through stage 4 chronic kidney disease, or unspecified chronic kidney disease: Secondary | ICD-10-CM | POA: Insufficient documentation

## 2024-02-10 NOTE — Progress Notes (Signed)
 Primary Care Physician: Myrlene Broker, MD Primary Cardiologist: Dr. Mariah Milling Primary Electrophysiologist: Dr. Nelly Laurence Referring Physician: Dr Nelly Laurence    Brenda [name] is a 73 y.o. female with a history of CAD, DM2, CLL, HTN, CKD IIIb-IV, and persistent atrial fibrillation who presents for follow up in the [facility]. The patient was initially diagnosed with atrial fibrillation in May 2022 and placed on metoprolol and Eliquis. She was started on amiodarone in June 2023. September 2023 cardiac monitor for lightheadedness and dizziness showed 27% AF burden and multiple pauses with longest lasting 8 seconds. Her carvedilol and amiodarone were discontinued. Follow up monitor did not show any significant pauses. She was seen by Cardiology on 10/14/22 for palpitations having coincided 2 days prior with initiation of levothyroxine. She was noted to be in Afib with RVR. She was sent to the ED and underwent successful cardioversion. Seen by Dr. Nelly Laurence 10/18/22 and after discussion agreed to proceed with ablation. She is now s/p Afib ablation on 01/14/23. Patient is on Eliquis 5 mg BID for stroke prevention.   In September 2023, she complained of lightheadedness and dizziness. A Zio patch was placed that showed sinus pauses up to about 8 seconds, so her Coreg and amiodarone were discontinued. Additionally, her TSH was elevated at 11.7. Pacemaker placement was deferred with a reversible cause. Monitor placed and follow-up did not show any atrial fibrillation or significant bradycardia. She did undergo DCCV on 06/19/23 in the ED and then again as an outpatient on 08/24/23.   Patient is s/p repeat afib and flutter ablation 10/11/23.   She had recurrent afib at her visit on 3/31 and underwent DCCV on 02/03/24.  Patient returns for follow up for atrial fibrillation. She remains in SR and feels well. No bleeding issues on anticoagulation.   Today, she  denies symptoms of  palpitations, chest pain, shortness of breath, orthopnea, PND, lower extremity edema, dizziness, presyncope, syncope, snoring, daytime somnolence, bleeding, or neurologic sequela. The patient is tolerating medications without difficulties and is otherwise without complaint today.     Atrial Fibrillation Risk Factors:  she does not have a history of rheumatic fever. she does not have a history of alcohol use. The patient does not have a history of early familial atrial fibrillation or other arrhythmias.   Atrial Fibrillation Management history:  Previous antiarrhythmic drugs: amiodarone Previous cardioversions: 10/14/22, 06/19/23, 08/24/23 Previous ablations: 01/14/23 Anticoagulation history: Eliquis 5 mg BID   Past Medical History:  Diagnosis Date   Anxiety    Arthritis    Atrial fibrillation (HCC)    CLL (chronic lymphocytic leukemia) (HCC)    Dx. 2019 Dr. 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 Sebastian Ache first visit 11-2019     Current Outpatient Medications  Medication Sig Dispense Refill   acetaminophen (TYLENOL) 500 MG tablet Take 500-1,000 mg by mouth every 6 (six) hours as needed for mild pain, moderate pain or fever.       ALPRAZolam (XANAX) 1 MG tablet Take 1 tablet (1 mg total) by mouth 2 (two) times daily as needed for anxiety. 60 tablet 5   amoxicillin (AMOXIL) 500 MG capsule Take 2,000 mg by mouth See admin instructions. 1 hour before dental appointment     Blood Glucose Monitoring Suppl (ONETOUCH VERIO) w/Device KIT Use as advised 1 kit 0   buPROPion (WELLBUTRIN XL) 300 MG 24 hr tablet TAKE 1 TABLET(300 MG) BY MOUTH DAILY 90 tablet 3   Cholecalciferol (VITAMIN D3) 2000 units TABS Take 2,000 mcg by mouth daily.     Cyanocobalamin (B-12) 1000 MCG SUBL Place 1,000 mcg under the tongue every evening.     doxazosin (CARDURA) 2 MG tablet TAKE 1 TABLET(2 MG) BY MOUTH EVERY EVENING 90 tablet 3   ELIQUIS 5 MG TABS tablet TAKE 1 TABLET(5 MG) BY MOUTH TWICE DAILY 60 tablet 5   esomeprazole (NEXIUM) 20 MG capsule Take 20 mg by mouth daily.     ezetimibe (ZETIA) 10 MG tablet Take 1 tablet (10 mg total) by mouth daily. PLEASE CALL [phone] TO SCHEDULE FOLLOW UP APPOINTMENT PRIOR TO NEXT REFILL REQUEST. THANK YOU. 90 tablet 0   glucose blood (ONETOUCH VERIO) test strip USE TO TEST BLOOD GLUCOSE THREE TIMES DAILY. 300 strip 5   insulin glargine (LANTUS SOLOSTAR) 100 UNIT/ML Solostar Pen Inject under skin 20-24 units after dinner 30 mL 3   Insulin Pen Needle (BD PEN NEEDLE NANO 2ND GEN) 32G X 4 MM MISC Use to inject insulin daily 100 each 3   iron polysaccharides (NIFEREX) 150 MG capsule Take 150 mg by mouth daily.     JARDIANCE 10 MG TABS tablet TAKE 1 TABLET(10 MG) BY MOUTH DAILY BEFORE BREAKFAST 30 tablet 11   Lancet Devices (LANCING DEVICE) MISC Use as advised - Freestyle 1 each 0   Lancets (ONETOUCH DELICA PLUS LANCET30G) MISC USE TO TEST BLOOD GLUCOSE THREE TIMES DAILY AS DIRECTED 300 each 1   lisinopril (ZESTRIL) 20 MG tablet Take 1 tablet (20 mg total) by mouth in the morning and at bedtime. 180 tablet 0   metoprolol succinate (TOPROL XL) 25 MG 24 hr tablet Take 1 tablet (25 mg total) by mouth daily. Take with or  immediately following a meal. (Patient taking differently: Take 25 mg by mouth 2 (two) times daily. Take with or immediately following a meal.) 30 tablet 11   NONFORMULARY OR COMPOUNDED ITEM Apply 1 application  topically 2 (two) times daily as needed (sun damage on face). FLUOROURACIL 5% + CALCIPOTRIENE 0.005%     simvastatin (ZOCOR) 40 MG tablet TAKE 1 TABLET(40 MG) BY MOUTH AT BEDTIME. PLEASE CALL [phone] TO SCHEDULE FOLLOW UP APPOINTMENT PRIOR TO NEXT REFILL REQUEST. THANK YOU. 90 tablet 0   No current facility-administered medications for this encounter.    ROS- All systems are reviewed and negative except as per the HPI above.  Physical Exam: Vitals:   02/10/24 0955  BP: 118/62  Pulse: 63  Weight: 77.6 kg  Height: 5' (1.524 m)     GEN: Well nourished,  well developed in no acute distress CARDIAC: Regular rate and rhythm, no murmurs, rubs, gallops RESPIRATORY:  Clear to auscultation without rales, wheezing or rhonchi  ABDOMEN: Soft, non-tender, non-distended EXTREMITIES:  No edema; No deformity    Wt Readings from Last 3 Encounters:  02/10/24 77.6 kg  01/30/24 77.6 kg  01/09/24 76.7 kg    EKG today demonstrates  SR, 1st degree AV block Vent. rate 63 BPM PR interval 202 ms QRS duration 86 ms QT/QTcB 426/435 ms   Echo 05/22/21 demonstrated:  1. Left ventricular ejection fraction, by estimation, is 60 to 65%. The  left ventricle has normal function. The left ventricle has no regional  wall motion abnormalities. Left ventricular diastolic parameters were  normal.   2. Right ventricular systolic function is normal. The right ventricular  size is normal.   3. The mitral valve is normal in structure. Mild mitral valve  regurgitation.  Epic records are reviewed at length today.   CHA2DS2-VASc Score = 5  The patient's score is based upon: CHF History: 0 HTN History: 1 Diabetes History: 1 Stroke History: 0 Vascular Disease History: 1 Age Score: 1 Gender  Score: 1       ASSESSMENT AND PLAN: Persistent Atrial Fibrillation/atrial flutter The patient's CHA2DS2-VASc score is 5, indicating a 7.2% annual risk of stroke.   S/p afib ablation 01/17/23, afib and flutter ablation 10/11/23 Previously failed amiodarone due to sinus pauses and thyroid dysfunction. Based on recent lab work she could qualify for dofetilide but her CrCl has been labile historically. She is also hesitant about being admitted to start the medication.  S/p DCCV 02/03/24 Patient appears to be maintaining SR Continue Eliquis 5 mg BID Continue Toprol 25 mg  Continue PRN diltiazem and propranolol.   Secondary Hypercoagulable State (ICD10:  D68.69) The patient is at significant risk for stroke/thromboembolism based upon her CHA2DS2-VASc Score of 5.  Continue Apixaban (Eliquis). No bleeding issues.   HTN Stable on current regimen   Follow up with Dr Nelly Laurence per recall.     Jorja Loa PA-C [facility] [address] [place], [place] [address] [phone] 02/10/2024 9:59 AM

## 2024-02-21 ENCOUNTER — Other Ambulatory Visit: Payer: Self-pay | Admitting: Cardiovascular Disease

## 2024-02-23 ENCOUNTER — Other Ambulatory Visit (HOSPITAL_COMMUNITY): Payer: Self-pay | Admitting: *Deleted

## 2024-02-23 DIAGNOSIS — Z8542 Personal history of malignant neoplasm of other parts of uterus: Secondary | ICD-10-CM | POA: Diagnosis not present

## 2024-02-23 DIAGNOSIS — C9110 Chronic lymphocytic leukemia of B-cell type not having achieved remission: Secondary | ICD-10-CM | POA: Diagnosis not present

## 2024-02-23 DIAGNOSIS — I129 Hypertensive chronic kidney disease with stage 1 through stage 4 chronic kidney disease, or unspecified chronic kidney disease: Secondary | ICD-10-CM | POA: Diagnosis not present

## 2024-02-23 DIAGNOSIS — D631 Anemia in chronic kidney disease: Secondary | ICD-10-CM | POA: Diagnosis not present

## 2024-02-23 DIAGNOSIS — N1832 Chronic kidney disease, stage 3b: Secondary | ICD-10-CM | POA: Diagnosis not present

## 2024-02-23 DIAGNOSIS — E1129 Type 2 diabetes mellitus with other diabetic kidney complication: Secondary | ICD-10-CM | POA: Diagnosis not present

## 2024-02-23 DIAGNOSIS — I4891 Unspecified atrial fibrillation: Secondary | ICD-10-CM | POA: Diagnosis not present

## 2024-02-23 MED ORDER — METOPROLOL SUCCINATE ER 25 MG PO TB24: 25.0000 mg | ORAL_TABLET | Freq: Two times a day (BID) | ORAL | 2 refills | Status: DC

## 2024-03-12 ENCOUNTER — Other Ambulatory Visit: Payer: Self-pay | Admitting: Cardiovascular Disease

## 2024-03-12 DIAGNOSIS — I4819 Other persistent atrial fibrillation: Secondary | ICD-10-CM

## 2024-03-12 NOTE — Telephone Encounter (Signed)
 Prescription refill request for Eliquis  received. Indication:afib Last office visit:4/25 Scr:1.45  3/25 Age: 73 Weight:77.6  kg  Prescription refilled

## 2024-03-13 ENCOUNTER — Encounter: Payer: Self-pay | Admitting: Cardiovascular Disease

## 2024-03-13 ENCOUNTER — Ambulatory Visit: Admitting: Cardiovascular Disease

## 2024-03-21 NOTE — Progress Notes (Signed)
 Patient ID: Brenda [name], female   DOB: Feb 06, 1951, 73 y.o.   MRN: [id number] Cardiology Office Note  Date:  03/22/2024   ID:  Brenda, [name] 1951/06/28, MRN [id number]  PCP:  Adelia Homestead, MD   Chief Complaint  Patient presents with   6 month follow up     Patient c/o shortness of breath with over exertion.     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  not a candidate for most AADs due to renal dysfunction, CAD who presents for routine followup of her coronary artery disease , atrial fibrillation hyperlipidemia.  Last seen by myself in clinic 8/24 Seen by one of our providers 3/25 Lives alone  Prior arrhythmia history reviewed Cardioversion dec 2024 Ablation 3/24 Seen by Dr. Arlester Ladd  s/p repeat afib and flutter ablation 10/11/23.  DCCV on 10/14/22, 06/19/23, 08/24/23  02/03/24.  failed amiodarone  due to sinus pauses and thyroid  dysfunction. Based on recent lab work she could qualify for dofetilide   On metoprolol  25 BID and eliquis  BID  Chronic SOB on exertion Rare chest discomfort Sedentary at baseline  Chronic leg swelling on the left, s/p surgery CR 1.45, BUN 40 (better) WBC 15.5 A1C 6.1  EKG personally reviewed by myself on todays visit EKG Interpretation Date/Time:  Thursday Mar 22 2024 11:35:55 EDT Ventricular Rate:  61 PR Interval:    QRS Duration:  88 QT  Interval:  424 QTC Calculation: 426 R Axis:   18  Text Interpretation: Normal sinus rhythm When compared with ECG of 10-Feb-2024 09:57, No significant change was found Confirmed by Belva Boyden [id number]) on 03/22/2024 12:00:33 PM   Other past medical history reviewed September 2023, she complained of lightheadedness and dizziness. A Zio patch was placed that showed sinus pauses up to about 8 seconds, so her Coreg  and amiodarone  were discontinued. Additionally, her TSH was elevated at 11.7. Pacemaker placement was deferred with a reversible cause.  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 Fever of 101.  So far cultures are negative.  Stool for C. difficile negative.  Patient on Rocephin  and Flagyl  Treated with midodrine   follow-up with Dr. Christianne Cowper from [facility]  PT recommended home with home services 24-hour supervision Was very SOB for weeks after she got home  hospital 11/2017 Dehydrated Lung nodules, enlarged lymph nodes Anemic  HBG 6.6 after hydration, iron  def, stool negative BP was low. 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  PSH:    Past Surgical History:  Procedure Laterality Date   ABDOMINAL HYSTERECTOMY  1998   ATRIAL FIBRILLATION ABLATION N/A 01/14/2023   Procedure: ATRIAL FIBRILLATION ABLATION;  Surgeon: Efraim Grange, MD;  Location: [facility];  Service: Cardiovascular;  Laterality: N/A;   ATRIAL FIBRILLATION ABLATION N/A 10/11/2023   Procedure: ATRIAL FIBRILLATION ABLATION;  Surgeon: Efraim Grange, MD;  Location: [facility];  Service: Cardiovascular;  Laterality: N/A;   CARDIOVERSION N/A 08/24/2023   Procedure: CARDIOVERSION;  Surgeon: Jacqueline Matsu, MD;  Location: [facility];  Service: Cardiovascular;  Laterality: N/A;   CARDIOVERSION N/A 02/03/2024   Procedure: CARDIOVERSION;  Surgeon: Loyde Rule, MD;  Location: [facility];  Service: Cardiovascular;  Laterality: N/A;   COLONOSCOPY     EYE SURGERY Bilateral    cataracts   EYE SURGERY Left    retina tear   FEMUR IM NAIL  10/25/2012   Procedure: INTRAMEDULLARY (IM) NAIL FEMORAL;  Surgeon: Bevin Bucks, MD;  Location: [facility];  Service: Orthopedics;  Laterality: Left;   HIP SURGERY     Fracture L IM nail and 3 rods   left knee arthroscopy  12/2010   LYMPH NODE BIOPSY     axillary left 12-2018   REFRACTIVE SURGERY     skin cancer removed right and and lower forearm     squamoun in situ   TOTAL KNEE ARTHROPLASTY  12/20/2011   Procedure: TOTAL KNEE ARTHROPLASTY;  Surgeon: Aurther Blue, MD;  Location: [facility];  Service: Orthopedics;  Laterality: Left;  Failed attempt at spinal    TOTAL SHOULDER ARTHROPLASTY Right 08/19/2016   Procedure: RIGHT TOTAL SHOULDER ARTHROPLASTY;  Surgeon: Ellard Gunning, MD;  Location: [facility];  Service: Orthopedics;  Laterality: Right;   TOTAL SHOULDER ARTHROPLASTY Left 01/10/2020   Procedure: TOTAL SHOULDER ARTHROPLASTY;  Surgeon: Ellard Gunning, MD;  Location: [facility];  Service:  Orthopedics;  Laterality: Left;    TRANSESOPHAGEAL ECHOCARDIOGRAM (CATH LAB) N/A 10/11/2023   Procedure: TRANSESOPHAGEAL ECHOCARDIOGRAM;  Surgeon: Efraim Grange, MD;  Location: [facility];  Service: Cardiovascular;  Laterality: N/A;    Current Outpatient Medications  Medication Sig Dispense Refill   acetaminophen  (TYLENOL ) 500 MG tablet Take 500-1,000 mg by mouth every 6 (six) hours as needed for mild pain, moderate pain or fever.      ALPRAZolam  (XANAX ) 1 MG tablet Take 1 tablet (1 mg total) by mouth 2 (two) times daily as needed for anxiety. 60 tablet 5   buPROPion  (WELLBUTRIN  XL) 300 MG 24 hr tablet TAKE 1 TABLET(300 MG) BY MOUTH DAILY 90 tablet 3   Cholecalciferol  (VITAMIN D3) 2000 units TABS Take 2,000 mcg by mouth daily.     Cyanocobalamin  (B-12) 1000 MCG SUBL Place 1,000 mcg under the tongue every evening.     doxazosin  (CARDURA ) 2 MG tablet TAKE 1 TABLET(2 MG) BY MOUTH EVERY EVENING 90 tablet 3   ELIQUIS  5 MG TABS tablet TAKE 1 TABLET(5 MG) BY MOUTH TWICE DAILY 60 tablet 5   esomeprazole (NEXIUM) 20 MG capsule Take 20 mg by mouth daily.     ezetimibe  (ZETIA ) 10 MG tablet TAKE 1 TABLET(10 MG) BY MOUTH DAILY  90 tablet 0   furosemide  (LASIX ) 20 MG tablet Take 20 mg by mouth as needed.     insulin  glargine (LANTUS  SOLOSTAR) 100 UNIT/ML Solostar Pen Inject under skin 20-24 units after dinner 30 mL 3   iron  polysaccharides (NIFEREX) 150 MG capsule Take 150 mg by mouth daily.     JARDIANCE  10 MG TABS tablet TAKE 1 TABLET(10 MG) BY MOUTH DAILY BEFORE BREAKFAST 30 tablet 11   lisinopril  (ZESTRIL ) 20 MG tablet Take 1 tablet (20 mg total) by mouth in the morning and at bedtime. 180 tablet 0   metoprolol  succinate (TOPROL  XL) 25 MG 24 hr tablet Take 1 tablet (25 mg total) by mouth 2 (two) times daily. Take with or immediately following a meal. 180 tablet 2   NONFORMULARY OR COMPOUNDED ITEM Apply 1 application  topically 2 (two) times daily as needed (sun damage on face).  FLUOROURACIL 5% + CALCIPOTRIENE 0.005%     simvastatin  (ZOCOR ) 40 MG tablet TAKE 1 TABLET(40 MG) BY MOUTH AT BEDTIME 90 tablet 0   amoxicillin (AMOXIL) 500 MG capsule Take 2,000 mg by mouth See admin instructions. 1 hour before dental appointment (Patient not taking: Reported on 03/22/2024)     Blood Glucose Monitoring Suppl (ONETOUCH VERIO) w/Device KIT Use as advised (Patient not taking: Reported on 03/22/2024) 1 kit 0   glucose blood (ONETOUCH VERIO) test strip USE TO TEST BLOOD GLUCOSE THREE TIMES DAILY. (Patient not taking: Reported on 03/22/2024) 300 strip 5   Insulin  Pen Needle (BD PEN NEEDLE NANO 2ND GEN) 32G X 4 MM MISC Use to inject insulin  daily (Patient not taking: Reported on 03/22/2024) 100 each 3   Lancet Devices (LANCING DEVICE) MISC Use as advised - Freestyle (Patient not taking: Reported on 03/22/2024) 1 each 0   Lancets (ONETOUCH DELICA PLUS LANCET30G) MISC USE TO TEST BLOOD GLUCOSE THREE TIMES DAILY AS DIRECTED (Patient not taking: Reported on 03/22/2024) 300 each 1   No current facility-administered medications for this visit.    Allergies:   Carvedilol , Gadolinium derivatives, Nsaids, Amiodarone , Amlodipine , Beta adrenergic blockers, Contrast media [iodinated contrast media], and Gabapentin    Social History:  The patient  reports that she has never smoked. She has never used smokeless tobacco. She reports that she does not drink alcohol and does not use drugs.   Family History:   family history includes Cancer in her father and mother.   Review of Systems: Review of Systems  Constitutional: Negative.   HENT: Negative.    Respiratory: Negative.    Cardiovascular: Negative.   Gastrointestinal: Negative.   Musculoskeletal:  Positive for back pain and joint pain.  Neurological: Negative.   Psychiatric/Behavioral: Negative.  Suicidal ideas: .   All other systems reviewed and are negative.   PHYSICAL EXAM: VS:  BP 122/60 (BP Location: Left Arm, Patient Position: Sitting,  Cuff Size: Normal)   Pulse 61   Ht 5' (1.524 m)   Wt 172 lb 2 oz (78.1 kg)   SpO2 92%   BMI 33.62 kg/m  , BMI Body mass index is 33.62 kg/m. Constitutional:  oriented to person, place, and time. No distress.  HENT:  Head: Grossly normal Eyes:  no discharge. No scleral icterus.  Neck: No JVD, no carotid bruits  Cardiovascular: Regular rate and rhythm, no murmurs appreciated Pulmonary/Chest: Clear to auscultation bilaterally, no wheezes or rales Abdominal: Soft.  no distension.  no tenderness.  Musculoskeletal: Normal range of motion Neurological:  normal muscle tone. Coordination normal. No atrophy Skin: Skin warm  and dry Psychiatric: normal affect, pleasant   Recent Labs: 06/19/2023: B Natriuretic Peptide 362.3 08/18/2023: TSH 3.40 01/06/2024: ALT 7; BUN 40; Creatinine 1.45; Hemoglobin 12.6; Platelet Count 135; Potassium 4.4; Sodium 140    Lipid Panel Lab Results  Component Value Date   CHOL 121 08/18/2023   HDL 31.10 (L) 08/18/2023   LDLCALC 69 08/18/2023   TRIG 108.0 08/18/2023     Wt Readings from Last 3 Encounters:  03/22/24 172 lb 2 oz (78.1 kg)  02/10/24 171 lb (77.6 kg)  01/30/24 171 lb (77.6 kg)    ASSESSMENT AND PLAN:  Essential hypertension -  Blood pressure is well controlled on today's visit. No changes made to the medications.  Atrial fibrillation with RVR Ablation 12/2022, repeat ablation 12/24 On eliquis  and metoprolol  succinate BID Ending normal sinus rhythm  Type 2 diabetes mellitus with hyperglycemia, without long-term current use of insulin  (HCC) Follows with endocrine,  A1C 6.1  morbid obesity Weight down, sedentary  HLD (hyperlipidemia) Tolerating simvastatin , zetia  LDL slightly above goal <55  Anemia History of iron  deficiency, followed by primary care Hemoglobin stable Chronic leukocytosis, CLL,  WBC down recently  Chronic renal failure Followed by nephrology CR 1.4, history of poorly controlled diabetes  Coronary artery  disease with stable angina Reports vague chest pressure, sometimes takes Tylenol  Does not feel that she needs to take NTG SL Discussed myoview stress testing and that it would not hurt her kidneys.  She will call us  if chest pain gets worse, Myoview could be ordered   Orders Placed This Encounter  Procedures   EKG 12-Lead   Signed, Juanda Noon, M.D., Ph.D. 03/22/2024  [facility] [place], [place] [phone]

## 2024-03-22 ENCOUNTER — Ambulatory Visit: Attending: Cardiovascular Disease | Admitting: Cardiovascular Disease

## 2024-03-22 ENCOUNTER — Encounter: Payer: Self-pay | Admitting: Cardiovascular Disease

## 2024-03-22 VITALS — BP 122/60 | HR 61 | Ht 60.0 in | Wt 172.1 lb

## 2024-03-22 DIAGNOSIS — I455 Other specified heart block: Secondary | ICD-10-CM | POA: Diagnosis not present

## 2024-03-22 DIAGNOSIS — I4819 Other persistent atrial fibrillation: Secondary | ICD-10-CM | POA: Insufficient documentation

## 2024-03-22 DIAGNOSIS — E785 Hyperlipidemia, unspecified: Secondary | ICD-10-CM | POA: Insufficient documentation

## 2024-03-22 DIAGNOSIS — I2510 Atherosclerotic heart disease of native coronary artery without angina pectoris: Secondary | ICD-10-CM | POA: Diagnosis not present

## 2024-03-22 DIAGNOSIS — I10 Essential (primary) hypertension: Secondary | ICD-10-CM | POA: Insufficient documentation

## 2024-03-22 DIAGNOSIS — I484 Atypical atrial flutter: Secondary | ICD-10-CM | POA: Insufficient documentation

## 2024-03-22 DIAGNOSIS — D6869 Other thrombophilia: Secondary | ICD-10-CM | POA: Insufficient documentation

## 2024-03-22 DIAGNOSIS — N1832 Chronic kidney disease, stage 3b: Secondary | ICD-10-CM | POA: Insufficient documentation

## 2024-03-22 MED ORDER — SIMVASTATIN 40 MG PO TABS: ORAL_TABLET | ORAL | 3 refills | Status: AC

## 2024-03-22 MED ORDER — EZETIMIBE 10 MG PO TABS: 10.0000 mg | ORAL_TABLET | Freq: Every day | ORAL | 3 refills | Status: AC

## 2024-03-22 MED ORDER — LISINOPRIL 20 MG PO TABS: 20.0000 mg | ORAL_TABLET | Freq: Two times a day (BID) | ORAL | 3 refills | Status: AC

## 2024-03-22 NOTE — Patient Instructions (Signed)

## 2024-04-02 ENCOUNTER — Ambulatory Visit (INDEPENDENT_AMBULATORY_CARE_PROVIDER_SITE_OTHER): Payer: Medicare Other

## 2024-04-02 VITALS — Ht 60.0 in | Wt 170.0 lb

## 2024-04-02 DIAGNOSIS — Z0000 Encounter for general adult medical examination without abnormal findings: Secondary | ICD-10-CM | POA: Diagnosis not present

## 2024-04-02 DIAGNOSIS — E118 Type 2 diabetes mellitus with unspecified complications: Secondary | ICD-10-CM

## 2024-04-02 NOTE — Progress Notes (Signed)
 Subjective:   Brenda [name] is a 73 y.o. who presents for a Medicare Wellness preventive visit.  As a reminder, Annual Wellness Visits don't include a physical exam, and some assessments may be limited, especially if this visit is performed virtually. We may recommend an in-person follow-up visit with your provider if needed.  Visit Complete: Virtual I connected with  [name] on 04/02/24 by a audio enabled telemedicine application and verified that I am speaking with the correct person using two identifiers.  Patient Location: Home  Provider Location: Office/Clinic  I discussed the limitations of evaluation and management by telemedicine. The patient expressed understanding and agreed to proceed.  Vital Signs: Because this visit was a virtual/telehealth visit, some criteria may be missing or patient reported. Any vitals not documented were not able to be obtained and vitals that have been documented are patient reported.  VideoDeclined- This patient declined Librarian, academic. Therefore the visit was completed with audio only.  Persons Participating in Visit: Patient.  AWV Questionnaire: Yes: Patient Medicare AWV questionnaire was completed by the patient on 03/30/2024; I have confirmed that all information answered by patient is correct and no changes since this date.  Cardiac Risk Factors include: advanced age (>74men, >27 women);diabetes mellitus;dyslipidemia;hypertension;obesity (BMI >30kg/m2)     Objective:     Today's Vitals   04/02/24 0932  Weight: 170 lb (77.1 kg)  Height: 5' (1.524 m)   Body mass index is 33.2 kg/m.     04/02/2024    9:31 AM 02/03/2024    9:44 AM 10/11/2023    6:08 AM 08/24/2023    8:31 AM 06/19/2023    8:09 AM 03/30/2023    9:34 AM 01/14/2023    6:12 AM  Advanced Directives  Does Patient Have a Medical Advance Directive? Yes Yes Yes Yes Yes Yes Yes  Type of Estate agent of  [place];Living will Healthcare Power of [place];Living will Healthcare Power of [place];Living will Healthcare Power of [place];Living will  Healthcare Power of [place];Living will Healthcare Power of [place];Living will  Does patient want to make changes to medical advance directive?       No - Patient declined  Copy of Healthcare Power of Attorney in Chart? No - copy requested     No - copy requested No - copy requested    Current Medications (verified) Outpatient Encounter Medications as of 04/02/2024  Medication Sig   acetaminophen  (TYLENOL ) 500 MG tablet Take 500-1,000 mg by mouth every 6 (six) hours as needed for mild pain, moderate pain or fever.    ALPRAZolam  (XANAX ) 1 MG tablet Take 1 tablet (1 mg total) by mouth 2 (two) times daily as needed for anxiety.   amoxicillin (AMOXIL) 500 MG capsule Take 2,000 mg by mouth See admin instructions. 1 hour before dental appointment   Blood Glucose Monitoring Suppl (ONETOUCH VERIO) w/Device KIT Use as advised   buPROPion  (WELLBUTRIN  XL) 300 MG 24 hr tablet TAKE 1 TABLET(300 MG) BY MOUTH DAILY   Cholecalciferol  (VITAMIN D3) 2000 units TABS Take 2,000 mcg by mouth daily.   Cyanocobalamin  (B-12) 1000 MCG SUBL Place 1,000 mcg under the tongue every evening.   doxazosin  (CARDURA ) 2 MG tablet TAKE 1 TABLET(2 MG) BY MOUTH EVERY EVENING   ELIQUIS  5 MG TABS tablet TAKE 1 TABLET(5 MG) BY MOUTH TWICE DAILY   esomeprazole (NEXIUM) 20 MG capsule Take 20 mg by mouth daily.   ezetimibe  (ZETIA ) 10 MG tablet Take 1 tablet (10 mg  total) by mouth daily.   furosemide  (LASIX ) 20 MG tablet Take 20 mg by mouth as needed.   glucose blood (ONETOUCH VERIO) test strip USE TO TEST BLOOD GLUCOSE THREE TIMES DAILY.   insulin  glargine (LANTUS  SOLOSTAR) 100 UNIT/ML Solostar Pen Inject under skin 20-24 units after dinner   Insulin  Pen Needle (BD PEN NEEDLE NANO 2ND GEN) 32G X 4 MM MISC Use to inject insulin  daily   iron  polysaccharides (NIFEREX) 150 MG capsule Take 150 mg by  mouth daily.   JARDIANCE  10 MG TABS tablet TAKE 1 TABLET(10 MG) BY MOUTH DAILY BEFORE BREAKFAST   Lancet Devices (LANCING DEVICE) MISC Use as advised - Freestyle   Lancets (ONETOUCH DELICA PLUS LANCET30G) MISC USE TO TEST BLOOD GLUCOSE THREE TIMES DAILY AS DIRECTED   lisinopril  (ZESTRIL ) 20 MG tablet Take 1 tablet (20 mg total) by mouth in the morning and at bedtime.   metoprolol  succinate (TOPROL  XL) 25 MG 24 hr tablet Take 1 tablet (25 mg total) by mouth 2 (two) times daily. Take with or immediately following a meal.   NONFORMULARY OR COMPOUNDED ITEM Apply 1 application  topically 2 (two) times daily as needed (sun damage on face). FLUOROURACIL 5% + CALCIPOTRIENE 0.005%   simvastatin  (ZOCOR ) 40 MG tablet TAKE 1 TABLET(40 MG) BY MOUTH AT BEDTIME   No facility-administered encounter medications on file as of 04/02/2024.    Allergies (verified) Carvedilol , Gadolinium derivatives, Nsaids, Amiodarone , Amlodipine , Beta adrenergic blockers, Contrast media [iodinated contrast media], and Gabapentin    History: Past Medical History:  Diagnosis Date   Anxiety    Arthritis    Atrial fibrillation (HCC)    CLL (chronic lymphocytic leukemia) (HCC)    Dx. 2019 Dr. 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 Izetta Marshall first visit 11-2019   Past Surgical History:  Procedure Laterality Date   ABDOMINAL HYSTERECTOMY  1998   ATRIAL FIBRILLATION ABLATION N/A 01/14/2023   Procedure: ATRIAL FIBRILLATION ABLATION;  Surgeon: Efraim Grange, MD;  [facility];  Service: Cardiovascular;  Laterality: N/A;   ATRIAL FIBRILLATION ABLATION N/A 10/11/2023   Procedure: ATRIAL FIBRILLATION ABLATION;  Surgeon: Efraim Grange, MD;  [facility]: [facility];  Service: Cardiovascular;  Laterality: N/A;   CARDIOVERSION N/A 08/24/2023   Procedure: CARDIOVERSION;  Surgeon: Jacqueline Matsu, MD;  [facility]: [facility];  Service: Cardiovascular;  Laterality: N/A;   CARDIOVERSION N/A 02/03/2024   Procedure: CARDIOVERSION;  Surgeon: Loyde Rule, MD;  [facility]: [facility];  Service: Cardiovascular;  Laterality: N/A;   COLONOSCOPY     EYE SURGERY Bilateral    cataracts   EYE SURGERY Left    retina tear   FEMUR IM NAIL  10/25/2012   Procedure: INTRAMEDULLARY (IM) NAIL FEMORAL;  Surgeon: Bevin Bucks, MD;  Location: [facility];  Service: Orthopedics;  Laterality: Left;   HIP SURGERY     Fracture L IM nail and 3 rods   left knee arthroscopy  12/2010   LYMPH NODE BIOPSY     axillary left 12-2018   REFRACTIVE SURGERY     skin cancer removed right and and lower forearm     squamoun in situ   TOTAL KNEE ARTHROPLASTY  12/20/2011   Procedure: TOTAL KNEE ARTHROPLASTY;  Surgeon: Aurther Blue, MD;  Location: [facility];  Service: Orthopedics;  Laterality: Left;  Failed attempt at spinal    TOTAL SHOULDER ARTHROPLASTY Right 08/19/2016   Procedure: RIGHT TOTAL SHOULDER ARTHROPLASTY;  Surgeon: Ellard Gunning, MD;  Location: [facility];  Service: Orthopedics;  Laterality: Right;   TOTAL SHOULDER ARTHROPLASTY Left 01/10/2020   Procedure: TOTAL SHOULDER ARTHROPLASTY;  Surgeon: Ellard Gunning, MD;  Location: [facility];  Service: Orthopedics;  Laterality: Left;     TRANSESOPHAGEAL ECHOCARDIOGRAM (CATH LAB) N/A 10/11/2023   Procedure: TRANSESOPHAGEAL ECHOCARDIOGRAM;  Surgeon: Efraim Grange, MD;  [facility];  Service: Cardiovascular;  Laterality: N/A;   Family History  Problem Relation Age of Onset   Cancer Mother        breast cancer   Cancer Father        plasma sarcoma   Breast cancer Neg Hx    Social History   Socioeconomic History   Marital status: Single    Spouse name: Not on file   Number of children: Not on file   Years of education: Not on file   Highest education level: Bachelor's degree (e.g., BA, AB, BS)  Occupational History   Occupation: Charity fundraiser at [employer]    Employer: [employer]  Tobacco Use   Smoking status: Never   Smokeless tobacco: Never   Tobacco comments:    Never smoked 11/08/23  Vaping Use   Vaping status: Never Used  Substance and Sexual Activity   Alcohol use: No   Drug use: No   Sexual activity: Not Currently  Other Topics Concern   Not on file  Social History Narrative   Retired [place] short Stay   Social Drivers of Health   Financial Resource Strain: Low Risk  (04/02/2024)   Overall Financial Resource Strain (CARDIA)    Difficulty of Paying Living Expenses: Not hard at all  Food Insecurity: No Food Insecurity (04/02/2024)   Hunger Vital Sign    Worried About Running Out of Food in the Last Year: Never true    Ran Out of Food in the Last Year: Never true  Transportation Needs: No Transportation Needs (04/02/2024)   PRAPARE - Administrator, Civil Service (Medical): No    Lack of Transportation (Non-Medical): No  Physical Activity: Inactive (04/02/2024)   Exercise Vital Sign    Days of Exercise per Week: 0 days    Minutes of Exercise per Session: 0 min  Stress: Stress Concern Present (04/02/2024)   [employer] of Occupational Health - Occupational Stress Questionnaire    Feeling of Stress : Very much  Social Connections: Moderately Isolated  (04/02/2024)   Social Connection and Isolation Panel [NHANES]    Frequency of Communication  with Friends and Family: Never    Frequency of Social Gatherings with Friends and Family: Once a week    Attends Religious Services: More than 4 times per year    Active Member of [employer] or Organizations: No    Attends Engineer, structural: 1 to 4 times per year    Marital Status: Never married    Tobacco Counseling Counseling given: No Tobacco comments: Never smoked 11/08/23    Clinical Intake:  Pre-visit preparation completed: Yes  Pain : No/denies pain     BMI - recorded: 33.2 Nutritional Status: BMI > 30  Obese Nutritional Risks: None Diabetes: Yes CBG done?: Yes CBG resulted in Enter/ Edit results?: Yes (fasting - 109) Did pt. bring in CBG monitor from home?: No  Lab Results  Component Value Date   HGBA1C 6.1 (A) 12/20/2023   HGBA1C 6.2 (A) 08/18/2023   HGBA1C 6.4 (A) 04/25/2023     How often do you need to have someone help you when you read instructions, pamphlets, or other written materials from your doctor or pharmacy?: 1 - Never  Interpreter Needed?: No  Information entered by :: Kandy Orris, CMA   Activities of Daily Living     04/02/2024    9:35 AM 03/30/2024    1:40 AM  In your present state of health, do you have any difficulty performing the following activities:  Hearing? 0 0  Vision? 0 0  Difficulty concentrating or making decisions? 0 0  Walking or climbing stairs? 1 1  Comment due to rods in hips and left knee and shoulder surgery in past   Dressing or bathing? 0 0  Doing errands, shopping? 0 0  Preparing Food and eating ? N N  Using the Toilet? N N  In the past six months, have you accidently leaked urine? Colie Dawes  Comment wears a pad   Do you have problems with loss of bowel control? N N  Managing your Medications? N N  Managing your Finances? N N  Housekeeping or managing your Housekeeping? N N    Patient Care Team: Adelia Homestead, MD as PCP - General (Internal Medicine) Jerelene Monday Deadra Everts, MD as PCP - Cardiology (Cardiology) Mealor, Donnamae Gaba, MD as PCP - Electrophysiology (Cardiology) Devorah Fonder, MD as Consulting Physician (Cardiology) Dema Filler, MD as Consulting Physician (Ophthalmology)  I have updated your Care Teams any recent Medical Services you may have received from other providers in the past year.     Assessment:    This is a routine wellness examination for Sakoya.  Hearing/Vision screen Hearing Screening - Comments:: Denies hearing difficulties   Vision Screening - Comments:: Wears eyeglasses for reading - appt in 04/2024 routine eye exam with Dr Juanito Norma    Goals Addressed               This Visit's Progress     Patient Stated (pt-stated)        Patient stated she wants to stay alive and healthy       Depression Screen     04/02/2024    9:36 AM 11/16/2023    8:08 AM 07/05/2023    1:40 PM 05/16/2023    8:16 AM 03/30/2023    9:33 AM 11/02/2022    8:16 AM 03/10/2022   11:51 AM  PHQ 2/9 Scores  PHQ - 2 Score 0 0  6 0 0 0  PHQ- 9 Score 5 0  19 0 0   Exception Documentation  Patient refusal        Fall Risk     04/02/2024    9:41 AM 03/30/2024    1:40 AM 05/16/2023    8:16 AM 03/30/2023    9:26 AM 03/30/2023    6:52 AM  Fall Risk   Falls in the past year? 0 0 0 0 0  Number falls in past yr: 0 0 0 0   Injury with Fall? 0 0 0 0   Risk for fall due to : No Fall Risks   No Fall Risks   Follow up Falls evaluation completed;Falls prevention discussed  Falls evaluation completed Falls prevention discussed     MEDICARE RISK AT HOME:  Medicare Risk at Home Any stairs in or around the home?: Yes (outside) If so, are there any without handrails?: No Home free of loose throw rugs in walkways, pet beds, electrical cords, etc?: Yes Adequate lighting in your home to reduce risk of falls?: Yes Life alert?: No Use of a cane, walker or w/c?: No Grab bars in the bathroom?:  Yes Shower chair or bench in shower?: No Elevated toilet seat or a handicapped toilet?: Yes  TIMED UP AND GO:  Was the test performed?  No  Cognitive Function: 6CIT completed        04/02/2024    9:41 AM 03/30/2023    9:36 AM 03/10/2022   12:00 PM  6CIT Screen  What Year? 0 points 0 points 0 points  What month? 0 points 0 points 0 points  What time? 0 points 0 points 0 points  Count back from 20 0 points 0 points 0 points  Months in reverse 0 points 0 points 0 points  Repeat phrase 0 points 0 points 0 points  Total Score 0 points 0 points 0 points    Immunizations Immunization History  Administered Date(s) Administered   Fluad Quad(high Dose 65+) 06/27/2019, 07/16/2020, 07/10/2021   Influenza,inj,quad, With Preservative 07/28/2017, 08/01/2018, 08/02/2019   Influenza-Unspecified 08/01/2014, 07/31/2016, 07/27/2022   PFIZER(Purple Top)SARS-COV-2 Vaccination 12/31/2019, 01/21/2020, 07/31/2020, 02/09/2021   Pfizer Covid-19 Vaccine Bivalent Booster 64yrs & up 08/04/2021   Pneumococcal Conjugate-13 10/01/2014   Pneumococcal Polysaccharide-23 11/03/2012, 12/19/2017   Respiratory Syncytial Virus Vaccine,Recomb Aduvanted(Arexvy) 07/21/2022   Td 11/12/2009   Td (Adult), 2 Lf Tetanus Toxid, Preservative Free 11/12/2009   Tdap 05/15/2020   Zoster Recombinant(Shingrix) 05/15/2020, 07/16/2020   Zoster, Live 12/14/2010    Screening Tests Health Maintenance  Topic Date Due   COVID-19 Vaccine (6 - 2024-25 season) 07/03/2023   Diabetic kidney evaluation - Urine ACR  08/17/2028 (Originally 01/02/2016)   OPHTHALMOLOGY EXAM  04/11/2024   FOOT EXAM  04/24/2024   INFLUENZA VACCINE  06/01/2024   HEMOGLOBIN A1C  06/18/2024   MAMMOGRAM  11/24/2024   Diabetic kidney evaluation - eGFR measurement  01/05/2025   Medicare Annual Wellness (AWV)  04/02/2025   Colonoscopy  08/31/2028   DTaP/Tdap/Td (4 - Td or Tdap) 05/15/2030   Pneumonia Vaccine 24+ Years old  Completed   DEXA SCAN  Completed    Hepatitis C Screening  Completed   Zoster Vaccines- Shingrix  Completed   HPV VACCINES  Aged Out   Meningococcal B Vaccine  Aged Out    Health Maintenance  Health Maintenance Due  Topic Date Due   COVID-19 Vaccine (6 - 2024-25 season) 07/03/2023   Health Maintenance Items Addressed:  Labs Ordered: Urine ACR  Additional Screening:  Vision Screening: Recommended annual ophthalmology exams for early detection of glaucoma and other disorders  of the eye. Would you like a referral to an eye doctor? No    Dental Screening: Recommended annual dental exams for proper oral hygiene  Community Resource Referral / Chronic Care Management: CRR required this visit?  No   CCM required this visit?  No   Plan:    I have personally reviewed and noted the following in the patient's chart:   Medical and social history Use of alcohol, tobacco or illicit drugs  Current medications and supplements including opioid prescriptions. Patient is not currently taking opioid prescriptions. Functional ability and status Nutritional status Physical activity Advanced directives List of other physicians Hospitalizations, surgeries, and ER visits in previous 12 months Vitals Screenings to include cognitive, depression, and falls Referrals and appointments  In addition, I have reviewed and discussed with patient certain preventive protocols, quality metrics, and best practice recommendations. A written personalized care plan for preventive services as well as general preventive health recommendations were provided to patient.   Patria Bookbinder, CMA   04/02/2024   After Visit Summary: (MyChart) Due to this being a telephonic visit, the after visit summary with patients personalized plan was offered to patient via MyChart   Notes: Nothing significant to report at this time.

## 2024-04-02 NOTE — Patient Instructions (Addendum)
 Ms. Brenda [name] , Thank you for taking time out of your busy schedule to complete your Annual Wellness Visit with me. I enjoyed our conversation and look forward to speaking with you again next year. I, as well as your care team,  appreciate your ongoing commitment to your health goals. Please review the following plan we discussed and let me know if I can assist you in the future. Your Game plan/ To Do List   Follow up Visits: Next Medicare AWV with our clinical staff: 04/04/2025   Have you seen your provider in the last 6 months (3 months if uncontrolled diabetes)? Yes Next Office Visit with your provider: 05/15/2024  Clinician Recommendations:  Aim for 30 minutes of exercise or brisk walking, 6-8 glasses of water , and 5 servings of fruits and vegetables each day. Ordered a Urine ACR.      This is a list of the screening recommended for you and due dates:  Health Maintenance  Topic Date Due   COVID-19 Vaccine (6 - 2024-25 season) 07/03/2023   Yearly kidney health urinalysis for diabetes  08/17/2028*   Eye exam for diabetics  04/11/2024   Complete foot exam   04/24/2024   Flu Shot  06/01/2024   Hemoglobin A1C  06/18/2024   Mammogram  11/24/2024   Yearly kidney function blood test for diabetes  01/05/2025   Medicare Annual Wellness Visit  04/02/2025   Colon Cancer Screening  08/31/2028   DTaP/Tdap/Td vaccine (4 - Td or Tdap) 05/15/2030   Pneumonia Vaccine  Completed   DEXA scan (bone density measurement)  Completed   Hepatitis C Screening  Completed   Zoster (Shingles) Vaccine  Completed   HPV Vaccine  Aged Out   Meningitis B Vaccine  Aged Out  *Topic was postponed. The date shown is not the original due date.    Advanced directives: (Copy Requested) Please bring a copy of your health care power of attorney and living will to the office to be added to your chart at your convenience. You can mail to [facility] [address] [place], [place] [address] or email to  [email or web address] Advance Care Planning is important because it:  [x]  Makes sure you receive the medical care that is consistent with your values, goals, and preferences  [x]  It provides guidance to your family and loved ones and reduces their decisional burden about whether or not they are making the right decisions based on your wishes.  Follow the link provided in your after visit summary or read over the paperwork we have mailed to you to help you started getting your Advance Directives in place. If you need assistance in completing these, please reach out to us  so that we can help you!

## 2024-04-10 ENCOUNTER — Other Ambulatory Visit: Payer: Self-pay | Admitting: Internal Medicine

## 2024-04-10 ENCOUNTER — Encounter: Payer: Self-pay | Admitting: Internal Medicine

## 2024-04-11 ENCOUNTER — Encounter: Payer: Self-pay | Admitting: Cardiovascular Disease

## 2024-04-11 ENCOUNTER — Other Ambulatory Visit: Payer: Self-pay | Admitting: Cardiovascular Disease

## 2024-04-13 DIAGNOSIS — E119 Type 2 diabetes mellitus without complications: Secondary | ICD-10-CM | POA: Diagnosis not present

## 2024-04-13 DIAGNOSIS — Z961 Presence of intraocular lens: Secondary | ICD-10-CM | POA: Diagnosis not present

## 2024-04-13 LAB — HM DIABETES EYE EXAM

## 2024-04-30 ENCOUNTER — Encounter: Payer: Self-pay | Admitting: Internal Medicine

## 2024-04-30 ENCOUNTER — Ambulatory Visit (INDEPENDENT_AMBULATORY_CARE_PROVIDER_SITE_OTHER): Payer: Medicare Other | Admitting: Internal Medicine

## 2024-04-30 VITALS — BP 120/72 | HR 65 | Ht 60.0 in | Wt 177.4 lb

## 2024-04-30 DIAGNOSIS — Z7984 Long term (current) use of oral hypoglycemic drugs: Secondary | ICD-10-CM | POA: Diagnosis not present

## 2024-04-30 DIAGNOSIS — E1169 Type 2 diabetes mellitus with other specified complication: Secondary | ICD-10-CM | POA: Diagnosis not present

## 2024-04-30 DIAGNOSIS — E032 Hypothyroidism due to medicaments and other exogenous substances: Secondary | ICD-10-CM | POA: Diagnosis not present

## 2024-04-30 DIAGNOSIS — E1165 Type 2 diabetes mellitus with hyperglycemia: Secondary | ICD-10-CM

## 2024-04-30 DIAGNOSIS — E66811 Obesity, class 1: Secondary | ICD-10-CM | POA: Diagnosis not present

## 2024-04-30 DIAGNOSIS — Z794 Long term (current) use of insulin: Secondary | ICD-10-CM | POA: Diagnosis not present

## 2024-04-30 DIAGNOSIS — E785 Hyperlipidemia, unspecified: Secondary | ICD-10-CM

## 2024-04-30 LAB — POCT GLYCOSYLATED HEMOGLOBIN (HGB A1C): Hemoglobin A1C: 6.4 % — AB (ref 4.0–5.6)

## 2024-04-30 NOTE — Progress Notes (Signed)
 Patient ID: Brenda [name], female   DOB: [birth year], 73 y.o.   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 Last visit 4.5 months ago.  Interim history: + increased urination, but no blurry vision, nausea, chest pain.   She has Afib >> had 2x cardioversion but  not successful.  She had a cardiac ablation in 10/2023.  She had to have another cardioversion 01/2024. She is trying to sell her house - very stressful.  She is also looking into independent living facilities.  DM2: Reviewed HbA1c levels: Lab Results  Component Value Date   HGBA1C 6.1 (A) 12/20/2023   HGBA1C 6.2 (A) 08/18/2023   HGBA1C 6.4 (A) 04/25/2023   Patient is on: - Lantus  12 units in a.m.- started 05/2019 by PCP >> 24 >> 18-20 >> 16-18 >> 18-20 >> 20 units after dinner - Jardiance  10 before breakfast-added 11/2020 She could not start Ozempic since our visit in 08/2023 due to price. She was on Glipizide  in 11/2015 after a steroid inj. We stopped Victoza  due to nausea, dehydration. Metformin  ER was stopped by PCP due to CKD. Glipizide  ER was stopped 06/2019 due to low blood sugars. She was previously on glipizide  XL but stopped 05/2022.  -Pt checks her sugars twice a day: - am: 74-130, 142 >> 73-127, 133 >> 73-130, 136 >> 78-126, 140, 170 - 2h after b'fast: 156 >> n/c >> 148 >> n/c >> 134 >> n/c - before lunch: 68-121, 140 >> 78-125, 140, 145 >> 70-124, 134 - 2h after lunch: 118, 123 >> 92, 99 >> n/c >> 145 >> n/c  - before dinner: 67-123, 132 >> 80-136, 140, 150 >> 88-132, 140, 168 - 2h after dinner: 138, 148 >> n/c >> 133 >> n/c - bedtime: 93-115 >> n/c >> 108 >> n/c >> 119 >> 155 - nighttime: n/c Lowest sugar was 37 x1 >> ... 68 >> 73 >> 70; she has hypoglycemia awareness in the 70s. Highest sugar was 170 >>  140 >> 150 >> 170.  Glucometer: Freestyle Lite  Pt's meals are: - Breakfast: English muffin + preserve or cereal or yoghurt >> fresh fruit + whole wheat bread/muffin/waffle + sugar free syrup or preserve - snack: fruit or fruit + yoghurt >> skips - Lunch: 1/2 sandwich + chips + salsa + fruit/sugar free >> protein bar >> skips - Dinner: soup or chicken/steak + veggies, occasional starch or Lean cuisine or light salad No soft drinks.  -+ CKD - sees Dr. Gearline, last BUN/creatinine:  Lab Results  Component Value Date   BUN 40 (H) 01/06/2024   CREATININE 1.45 (H) 01/06/2024  On benazepril .  Sees nephrology. She had a high potassium (5.9) >> used Kayexalate  x3 >> normalized.  She is on a low potassium diet.  -+ HL last set of lipids: Lab Results  Component Value Date   CHOL 121 08/18/2023   HDL 31.10 (L) 08/18/2023   LDLCALC 69 08/18/2023   LDLDIRECT 55.0 05/15/2020   TRIG 108.0 08/18/2023   CHOLHDL 4 08/18/2023  On Zocor  40 and Zetia  10.  - last eye exam 04/13/2024: No DR; she had cataract surgery in 2016.  She has a history of retinal tear.  - no numbness and tingling in her feet.  She takes Neurontin  for leg cramps at night.  Last foot exam 04/25/2023.  Acquired hypothyroidism: -Likely related  to amiodarone  treatment >> now off definitively  Reviewed her TFTs: Lab Results  Component Value Date   TSH 3.40 08/18/2023   TSH 3.833 06/19/2023   TSH 4.67 04/25/2023   TSH 4.99 12/28/2022   TSH 8.742 (H) 11/17/2022   TSH 22.801 (H) 09/27/2022   TSH 11.721 (H) 07/30/2022   TSH 2.453 04/19/2022   TSH 2.640 04/14/2021   TSH 1.643 11/19/2017   TSH 2.67 03/07/2017   Lab Results  Component Value Date   FREET4 1.01 08/18/2023   FREET4 0.88 06/19/2023   FREET4 1.00 04/25/2023   FREET4 0.77 12/28/2022   FREET4 0.77 11/17/2022   FREET4 0.55 (L) 09/27/2022   T3FREE 3.2 08/18/2023   T3FREE 3.0 11/17/2022   She was started on levothyroxine  25 mcg daily - started 09/2022.   However, we were able to stop the medication in 04/2023.  She also has atrial fibrillation - She was in the emergency room with A-fib with RVR 10/14/2022. She had a cardioversion. She had a cardiac ablation 12/2022.    She also has HTN, GERD, depression. She was admitted 11/2017 for dehydration, anemia, and acute renal failure >> diagnosed with CLL. She has lifelong insomnia.  In the past, she was waking up at 3 AM to go to work at 5:30 AM.   ROS: + see HPI  I reviewed pt's medications, allergies, PMH, social hx, family hx, and changes were documented in the history of present illness. Otherwise, unchanged from my initial visit note.  Past Medical History:  Diagnosis Date   Anxiety    Arthritis    Atrial fibrillation (HCC)    CLL (chronic lymphocytic leukemia) (HCC)    Dx. 2019 Dr. 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 Denver first visit 11-2019   Past Surgical History:  Procedure Laterality Date   ABDOMINAL HYSTERECTOMY  1998   ATRIAL FIBRILLATION ABLATION N/A 01/14/2023   Procedure: ATRIAL FIBRILLATION ABLATION;  Surgeon: Nancey Eulas BRAVO, MD;  Location: [facility];  Service: Cardiovascular;  Laterality: N/A;   ATRIAL  FIBRILLATION ABLATION N/A 10/11/2023   Procedure: ATRIAL FIBRILLATION ABLATION;  Surgeon: Nancey Eulas BRAVO, MD;  Location: [facility];  Service: Cardiovascular;  Laterality: N/A;   CARDIOVERSION N/A 08/24/2023   Procedure: CARDIOVERSION;  Surgeon: Shlomo Wilbert SAUNDERS, MD;  Location: [facility];  Service: Cardiovascular;  Laterality: N/A;   CARDIOVERSION N/A 02/03/2024   Procedure: CARDIOVERSION;  Surgeon: Delford Maude BROCKS, MD;  Location: [facility];  Service: Cardiovascular;  Laterality: N/A;  COLONOSCOPY     EYE SURGERY Bilateral    cataracts   EYE SURGERY Left    retina tear   FEMUR IM NAIL  10/25/2012   Procedure: INTRAMEDULLARY (IM) NAIL FEMORAL;  Surgeon: Donnice JONETTA Car, MD;  Location: [facility];  Service: Orthopedics;  Laterality: Left;   HIP SURGERY     Fracture L IM nail and 3 rods   left knee arthroscopy  12/2010   LYMPH NODE BIOPSY     axillary left 12-2018   REFRACTIVE SURGERY     skin cancer removed right and and lower forearm     squamoun in situ   TOTAL KNEE ARTHROPLASTY  12/20/2011   Procedure: TOTAL KNEE ARTHROPLASTY;  Surgeon: Dempsey LULLA Moan, MD;  Location: [facility];  Service: Orthopedics;  Laterality: Left;  Failed attempt at spinal    TOTAL SHOULDER ARTHROPLASTY Right 08/19/2016   Procedure: RIGHT TOTAL SHOULDER ARTHROPLASTY;  Surgeon: Franky Pointer, MD;  Location: [facility];  Service: Orthopedics;  Laterality: Right;   TOTAL SHOULDER ARTHROPLASTY Left 01/10/2020   Procedure: TOTAL SHOULDER ARTHROPLASTY;  Surgeon: Pointer Franky, MD;  Location: [facility];  Service: Orthopedics;  Laterality: Left;    TRANSESOPHAGEAL ECHOCARDIOGRAM (CATH LAB) N/A 10/11/2023   Procedure: TRANSESOPHAGEAL ECHOCARDIOGRAM;  Surgeon: Nancey Eulas BRAVO, MD;  Location: [facility];  Service: Cardiovascular;  Laterality: N/A;   Social History   Social History   Marital Status: Single    Spouse Name: N/A   Number of Children: 0   Occupational History   Charity fundraiser at [employer]    Social History Main Topics   Smoking status: Never Smoker    Smokeless tobacco: Never Used   Alcohol Use: No   Drug Use: No   Social History Designer, 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      ALPRAZolam  (XANAX ) 1 MG tablet TAKE 1 TABLET(1 MG) BY MOUTH TWICE DAILY AS NEEDED FOR ANXIETY 60 tablet 5   amoxicillin (AMOXIL) 500 MG capsule Take 2,000 mg by mouth See admin instructions. 1 hour before dental appointment     Blood Glucose Monitoring Suppl (ONETOUCH VERIO) w/Device KIT Use as advised 1 kit 0   buPROPion  (WELLBUTRIN  XL) 300 MG 24 hr tablet TAKE 1 TABLET(300 MG) BY MOUTH DAILY 90 tablet 3   Cholecalciferol  (VITAMIN D3) 2000 units TABS Take 2,000 mcg by mouth daily.     Cyanocobalamin  (B-12) 1000 MCG SUBL Place 1,000 mcg under the tongue every evening.     doxazosin  (CARDURA ) 2 MG tablet TAKE 1 TABLET(2 MG) BY MOUTH EVERY EVENING 90 tablet 3   ELIQUIS  5 MG TABS tablet TAKE 1 TABLET(5 MG) BY MOUTH TWICE DAILY 60 tablet 5   esomeprazole (NEXIUM) 20 MG capsule Take 20 mg by mouth daily.     ezetimibe  (ZETIA ) 10 MG tablet Take 1 tablet (10 mg total) by mouth daily. 90 tablet 3   furosemide  (LASIX ) 20 MG tablet Take 20 mg by mouth as needed.     glucose blood (ONETOUCH VERIO) test strip USE TO TEST BLOOD GLUCOSE THREE TIMES DAILY. 300 strip 5   insulin  glargine (LANTUS  SOLOSTAR) 100 UNIT/ML Solostar Pen Inject under skin 20-24 units after dinner 30 mL 3   Insulin  Pen Needle (BD  PEN NEEDLE NANO 2ND GEN) 32G X 4 MM MISC Use to inject insulin  daily 100 each 3   iron  polysaccharides (NIFEREX) 150 MG capsule Take 150 mg by mouth daily.     JARDIANCE  10 MG TABS tablet TAKE 1 TABLET(10 MG) BY MOUTH DAILY BEFORE BREAKFAST 30 tablet 11   Lancet Devices (LANCING DEVICE) MISC Use as  advised - Freestyle 1 each 0   Lancets (ONETOUCH DELICA PLUS LANCET30G) MISC USE TO TEST BLOOD GLUCOSE THREE TIMES DAILY AS DIRECTED 300 each 1   lisinopril  (ZESTRIL ) 20 MG tablet Take 1 tablet (20 mg total) by mouth in the morning and at bedtime. 180 tablet 3   metoprolol  succinate (TOPROL  XL) 25 MG 24 hr tablet Take 1 tablet (25 mg total) by mouth 2 (two) times daily. Take with or immediately following a meal. 180 tablet 2   NONFORMULARY OR COMPOUNDED ITEM Apply 1 application  topically 2 (two) times daily as needed (sun damage on face). FLUOROURACIL 5% + CALCIPOTRIENE 0.005%     simvastatin  (ZOCOR ) 40 MG tablet TAKE 1 TABLET(40 MG) BY MOUTH AT BEDTIME 90 tablet 3   No current facility-administered medications on file prior to visit.   Allergies  Allergen Reactions   Carvedilol  Other (See Comments)    Sinus Pauses   Gadolinium Derivatives Other (See Comments)    Effects Kidney Function    Nsaids Other (See Comments)    Due to kidney function   Amiodarone  Other (See Comments)    Causes bradycardia and decreased thyroid  function   Amlodipine      Swelling-  leg swell    Beta Adrenergic Blockers     Decreased heart rate   Contrast Media [Iodinated Contrast Media] Other (See Comments)    Patient states unable to take / use due to kidney function, NOT allergy!   Gabapentin  Other (See Comments)    confusion   Family History  Problem Relation Age of Onset   Cancer Mother        breast cancer   Cancer Father        plasma sarcoma   Breast cancer Neg Hx    PE: BP 120/72   Pulse 65   Ht 5' (1.524 m)   Wt 177 lb 6.4 oz (80.5 kg)   SpO2 95%   BMI 34.65 kg/m   Wt Readings from Last 15 Encounters:  04/30/24 177 lb 6.4 oz (80.5 kg)  04/02/24 170 lb (77.1 kg)  03/22/24 172 lb 2 oz (78.1 kg)  02/10/24 171 lb (77.6 kg)  01/30/24 171 lb (77.6 kg)  01/09/24 169 lb (76.7 kg)  01/06/24 167 lb (75.8 kg)  12/20/23 167 lb 6.4 oz (75.9 kg)  11/16/23 171 lb (77.6 kg)  11/08/23 168 lb 9.6  oz (76.5 kg)  10/11/23 159 lb (72.1 kg)  09/08/23 166 lb 6.4 oz (75.5 kg)  08/24/23 167 lb 15.9 oz (76.2 kg)  08/19/23 168 lb (76.2 kg)  08/18/23 170 lb 6.4 oz (77.3 kg)   Constitutional: overweight, in NAD Eyes: EOMI, no exophthalmos ENT: no thyromegaly, no cervical lymphadenopathy Cardiovascular: RRR, No MRG. + L> RLE swelling, pitting edema Respiratory: CTA B Musculoskeletal: no deformities Skin: no rashes Neurological: no tremor with outstretched hands Diabetic Foot Exam - Simple   Simple Foot Form Diabetic Foot exam was performed with the following findings: Yes 04/30/2024  8:35 AM  Visual Inspection See comments: Yes Sensation Testing Intact to touch and monofilament testing bilaterally: Yes Pulse Check Posterior Tibialis and Dorsalis pulse  intact bilaterally: Yes Comments + Significant pitting edema in the left foot and less significant in the right foot     Assessment: 1. DM2, insulin -dependent, uncontrolled, with complications  - mild CKD  2. Obesity class 1  3. HL  4.  Amiodarone -induced hypothyroidism - resolved  PLAN:  1. Patient with longstanding, uncontrolled, type 2 diabetes, on oral antidiabetic regimen with SGLT2 inhibitor and also long-acting insulin .  Her blood sugars improved significantly after adding insulin  in 2020.  At that time, metformin  was stopped due to worsening kidney function.  She was previously on Victoza  but could not tolerate it due to nausea.  She was reticent to try another GLP-1 receptor agonist but she finally agreed to start Ozempic.  Unfortunately, this was not covered for her. - At last visit, sugars were mostly at goal with only occasional mild hyperglycemic spikes.  We did not change the regimen.  She declined using a GLP-1 receptor agonist at that time due to fear of GI side effects - At today's visit, the vast majority of her blood sugars are at goal.  She mentions that the sugars are slightly higher than she would expect before  dinner, and we discussed that this may be due to drinking even sugar-free drinks during the day.  I would not recommend a change in regimen for now. - I suggested to:  Patient Instructions  Please continue: - Jardiance  10 mg before b'fast  - Lantus  (20-)22 units after dinner   Please return in 4 months with your sugar log.  - we checked her HbA1c: 6.1% (lower) - advised to check sugars at different times of the day - 1x a day, rotating check times - advised for yearly eye exams >> she is UTD - return to clinic in 4 months  2. Obesity class 1 -She has problems eating the right foods, since she also has to limit potassium - Will continue Jardiance , which should also help with weight loss -we previously discussed that she continued to gain weight and unfortunately this was putting more strain on her back.  She cannot exercise due to the pain and was thinking about physical therapy.  I strongly advised her to do so.  She mentioned that she was getting severe back pain with standing for 2 to 3 hours.  I advised her to try to exercise for shorter periods of time and also to do some chair exercises - She lost 3 pounds night before last visit - At last visit, she mentions that she thought that Ozempic was covered for her in the new year, but she did not want to start it at that time  3.  Hyperlipidemia -Lipid panel was reviewed from 08/2023: LDL at goal, HDL slightly low: Lab Results  Component Value Date   CHOL 121 08/18/2023   HDL 31.10 (L) 08/18/2023   LDLCALC 69 08/18/2023   LDLDIRECT 55.0 05/15/2020   TRIG 108.0 08/18/2023   CHOLHDL 4 08/18/2023  - Will continue Zocor  40 mg daily and Zetia  10 mg daily-no side effects  4.  Amiodarone -induced hypothyroidism - She developed hypothyroidism while on amiodarone , now resolved - Latest TFTs were normal off levothyroxine  Lab Results  Component Value Date   TSH 3.40 08/18/2023  - Will continue to keep an eye on her TFTs -we will repeat  these at next visit  Lela Fendt, MD PhD [facility]

## 2024-04-30 NOTE — Patient Instructions (Signed)
 Please continue: - Jardiance 10 mg before b'fast  - Lantus (20-)22 units after dinner   Please return in 4 months with your sugar log.

## 2024-05-15 ENCOUNTER — Encounter: Payer: Self-pay | Admitting: Internal Medicine

## 2024-05-15 ENCOUNTER — Ambulatory Visit (INDEPENDENT_AMBULATORY_CARE_PROVIDER_SITE_OTHER): Payer: Medicare Other | Admitting: Internal Medicine

## 2024-05-15 VITALS — BP 116/64 | HR 60 | Temp 98.5°F | Ht 60.0 in | Wt 178.0 lb

## 2024-05-15 DIAGNOSIS — F419 Anxiety disorder, unspecified: Secondary | ICD-10-CM | POA: Diagnosis not present

## 2024-05-15 DIAGNOSIS — E118 Type 2 diabetes mellitus with unspecified complications: Secondary | ICD-10-CM | POA: Diagnosis not present

## 2024-05-15 DIAGNOSIS — C9110 Chronic lymphocytic leukemia of B-cell type not having achieved remission: Secondary | ICD-10-CM | POA: Diagnosis not present

## 2024-05-15 DIAGNOSIS — D6869 Other thrombophilia: Secondary | ICD-10-CM | POA: Diagnosis not present

## 2024-05-15 DIAGNOSIS — E785 Hyperlipidemia, unspecified: Secondary | ICD-10-CM

## 2024-05-15 DIAGNOSIS — Z7984 Long term (current) use of oral hypoglycemic drugs: Secondary | ICD-10-CM | POA: Diagnosis not present

## 2024-05-15 DIAGNOSIS — E1169 Type 2 diabetes mellitus with other specified complication: Secondary | ICD-10-CM

## 2024-05-15 DIAGNOSIS — R600 Localized edema: Secondary | ICD-10-CM

## 2024-05-15 DIAGNOSIS — I700 Atherosclerosis of aorta: Secondary | ICD-10-CM | POA: Diagnosis not present

## 2024-05-15 DIAGNOSIS — N1832 Chronic kidney disease, stage 3b: Secondary | ICD-10-CM | POA: Diagnosis not present

## 2024-05-15 DIAGNOSIS — I4819 Other persistent atrial fibrillation: Secondary | ICD-10-CM | POA: Diagnosis not present

## 2024-05-15 NOTE — Assessment & Plan Note (Signed)
 Recent labs improved and she is counseled about lasix  usage and to keep rare. BP at goal.

## 2024-05-15 NOTE — Assessment & Plan Note (Signed)
 Suspect related to knee replacement. Lasix  given to her by another provider did not help. Advised to not take. We discussed compression stockings, elevation, us  for vein assessment and decided to try lower grade compression stockings.

## 2024-05-15 NOTE — Assessment & Plan Note (Signed)
 Taking simvastatin  and continue.

## 2024-05-15 NOTE — Assessment & Plan Note (Signed)
 Labs with endo recently HgA1c 6.4 and is on lantus  and jardiance . Continue.

## 2024-05-15 NOTE — Progress Notes (Signed)
   Subjective:   Patient ID: Brenda [name], female    DOB: 1951-10-10, 73 y.o.   MRN: [id number]  HPI The patient is a 73 YO female coming in for medical management (see A/P for details).   Review of Systems  Constitutional:  Positive for activity change and fatigue.  HENT: Negative.    Eyes: Negative.   Respiratory:  Negative for cough, chest tightness and shortness of breath.   Cardiovascular:  Positive for leg swelling. Negative for chest pain and palpitations.  Gastrointestinal:  Negative for abdominal distention, abdominal pain, constipation, diarrhea, nausea and vomiting.  Musculoskeletal:  Positive for arthralgias and joint swelling.  Skin: Negative.   Neurological: Negative.   Psychiatric/Behavioral: Negative.      Objective:  Physical Exam Constitutional:      Appearance: She is well-developed.  HENT:     Head: Normocephalic and atraumatic.  Cardiovascular:     Rate and Rhythm: Normal rate and regular rhythm.  Pulmonary:     Effort: Pulmonary effort is normal. No respiratory distress.     Breath sounds: Normal breath sounds. No wheezing or rales.  Abdominal:     General: Bowel sounds are normal. There is no distension.     Palpations: Abdomen is soft.     Tenderness: There is no abdominal tenderness. There is no rebound.  Musculoskeletal:     Cervical back: Normal range of motion.     Right lower leg: Edema present.     Left lower leg: Edema present.     Comments: Swelling left more than right bilateral to knees  Skin:    General: Skin is warm and dry.  Neurological:     Mental Status: She is alert and oriented to person, place, and time.     Coordination: Coordination normal.     Vitals:   05/15/24 0808  BP: 116/64  Pulse: 60  Temp: 98.5 F (36.9 C)  TempSrc: Oral  SpO2: 96%  Weight: 178 lb (80.7 kg)  Height: 5' (1.524 m)    Assessment & Plan:

## 2024-05-15 NOTE — Patient Instructions (Signed)
 We will keep things the same for now.

## 2024-05-15 NOTE — Assessment & Plan Note (Signed)
 Discussed recent CBC results improved and may have been temporary. Getting repeat with heme/onc in Sept 2025. No new symptoms. Is having fatigue.
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
## 2024-05-15 NOTE — Assessment & Plan Note (Signed)
 Taking eliquis  and monitoring for clinical bleeding at every visit.

## 2024-05-15 NOTE — Assessment & Plan Note (Signed)
 Due for lipid panel in the fall and continue to monitor. Adjust simvastatin  as needed.

## 2024-07-10 ENCOUNTER — Other Ambulatory Visit: Payer: Self-pay

## 2024-07-10 DIAGNOSIS — C9110 Chronic lymphocytic leukemia of B-cell type not having achieved remission: Secondary | ICD-10-CM

## 2024-07-11 ENCOUNTER — Inpatient Hospital Stay: Attending: Hematology

## 2024-07-11 ENCOUNTER — Inpatient Hospital Stay (HOSPITAL_BASED_OUTPATIENT_CLINIC_OR_DEPARTMENT_OTHER): Admitting: Hematology

## 2024-07-11 VITALS — BP 139/52 | HR 61 | Temp 97.7°F | Resp 18 | Wt 181.4 lb

## 2024-07-11 DIAGNOSIS — D696 Thrombocytopenia, unspecified: Secondary | ICD-10-CM | POA: Insufficient documentation

## 2024-07-11 DIAGNOSIS — Z803 Family history of malignant neoplasm of breast: Secondary | ICD-10-CM | POA: Diagnosis not present

## 2024-07-11 DIAGNOSIS — N189 Chronic kidney disease, unspecified: Secondary | ICD-10-CM | POA: Diagnosis not present

## 2024-07-11 DIAGNOSIS — Z8542 Personal history of malignant neoplasm of other parts of uterus: Secondary | ICD-10-CM | POA: Diagnosis not present

## 2024-07-11 DIAGNOSIS — D509 Iron deficiency anemia, unspecified: Secondary | ICD-10-CM | POA: Insufficient documentation

## 2024-07-11 DIAGNOSIS — C9110 Chronic lymphocytic leukemia of B-cell type not having achieved remission: Secondary | ICD-10-CM

## 2024-07-11 LAB — CMP (CANCER CENTER ONLY)
ALT: 9 U/L (ref 0–44)
AST: 14 U/L — ABNORMAL LOW (ref 15–41)
Albumin: 4.6 g/dL (ref 3.5–5.0)
Alkaline Phosphatase: 76 U/L (ref 38–126)
Anion gap: 5 (ref 5–15)
BUN: 24 mg/dL — ABNORMAL HIGH (ref 8–23)
CO2: 30 mmol/L (ref 22–32)
Calcium: 9.4 mg/dL (ref 8.9–10.3)
Chloride: 106 mmol/L (ref 98–111)
Creatinine: 1.5 mg/dL — ABNORMAL HIGH (ref 0.44–1.00)
GFR, Estimated: 36 mL/min — ABNORMAL LOW (ref >60)
Glucose, Bld: 75 mg/dL (ref 70–99)
Potassium: 5.1 mmol/L (ref 3.5–5.1)
Sodium: 141 mmol/L (ref 135–145)
Total Bilirubin: 0.7 mg/dL (ref 0.0–1.2)
Total Protein: 6.3 g/dL — ABNORMAL LOW (ref 6.5–8.1)

## 2024-07-11 LAB — CBC WITH DIFFERENTIAL (CANCER CENTER ONLY)
Abs Immature Granulocytes: 0.04 K/uL (ref 0.00–0.07)
Basophils Absolute: 0.1 K/uL (ref 0.0–0.1)
Basophils Relative: 1 %
Eosinophils Absolute: 0.3 K/uL (ref 0.0–0.5)
Eosinophils Relative: 2 %
HCT: 42.1 % (ref 36.0–46.0)
Hemoglobin: 13 g/dL (ref 12.0–15.0)
Immature Granulocytes: 0 %
Lymphocytes Relative: 64 %
Lymphs Abs: 9.1 K/uL — ABNORMAL HIGH (ref 0.7–4.0)
MCH: 28.4 pg (ref 26.0–34.0)
MCHC: 30.9 g/dL (ref 30.0–36.0)
MCV: 91.9 fL (ref 80.0–100.0)
Monocytes Absolute: 1.4 K/uL — ABNORMAL HIGH (ref 0.1–1.0)
Monocytes Relative: 9 %
Neutro Abs: 3.4 K/uL (ref 1.7–7.7)
Neutrophils Relative %: 24 %
Platelet Count: 127 K/uL — ABNORMAL LOW (ref 150–400)
RBC: 4.58 MIL/uL (ref 3.87–5.11)
RDW: 13.2 % (ref 11.5–15.5)
WBC Count: 14.3 K/uL — ABNORMAL HIGH (ref 4.0–10.5)
nRBC: 0 % (ref 0.0–0.2)

## 2024-07-11 LAB — LACTATE DEHYDROGENASE: LDH: 168 U/L (ref 98–192)

## 2024-07-17 DIAGNOSIS — Z23 Encounter for immunization: Secondary | ICD-10-CM | POA: Diagnosis not present

## 2024-07-17 NOTE — Progress Notes (Signed)
 HEMATOLOGY/ONCOLOGY CLINIC NOTE  Date of Service: .07/11/2024  Patient Care Team: Rollene Almarie LABOR, MD as PCP - General (Internal Medicine) Perla Evalene PARAS, MD as PCP - Cardiology (Cardiology) Mealor, Eulas BRAVO, MD as PCP - Electrophysiology (Cardiology) Perla Evalene PARAS, MD as Consulting Physician (Cardiology) Leslee Reusing, MD as Consulting Physician (Ophthalmology)  CHIEF COMPLAINTS/PURPOSE OF CONSULTATION:  Follow-up for continued evaluation and management of CLL  HISTORY OF PRESENTING ILLNESS:  Please see previous note for details on initial presentation  Interval History:  Brenda [name] is here for continued evaluation and management of her CLL. She notes no acute new CLL related symptomatology over the last 6 months. No fevers no chills no night sweats no unexpected weight loss. No new lumps or bumps. No new chest pain or shortness of breath. No new abdominal pain or distention.  MEDICAL HISTORY:  Past Medical History:  Diagnosis Date   Anxiety    Arthritis    Atrial fibrillation (HCC)    CLL (chronic lymphocytic leukemia) (HCC)    Dx. 2019 Dr. 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 Denver first  visit 11-2019    SURGICAL HISTORY: Past Surgical History:  Procedure Laterality Date   ABDOMINAL HYSTERECTOMY  1998   ATRIAL FIBRILLATION ABLATION N/A 01/14/2023   Procedure: ATRIAL FIBRILLATION ABLATION;  Surgeon: Nancey Eulas BRAVO, MD;  [facility]: [facility];  Service: Cardiovascular;  Laterality: N/A;   ATRIAL FIBRILLATION ABLATION N/A 10/11/2023   Procedure: ATRIAL FIBRILLATION ABLATION;  Surgeon: Nancey Eulas BRAVO, MD;  Location: [facility];  Service: Cardiovascular;  Laterality: N/A;   CARDIOVERSION N/A 08/24/2023   Procedure: CARDIOVERSION;  Surgeon: Shlomo Wilbert SAUNDERS, MD;  Location: [facility];  Service: Cardiovascular;  Laterality: N/A;   CARDIOVERSION N/A 02/03/2024   Procedure: CARDIOVERSION;  Surgeon: Delford Maude BROCKS, MD;  Location: [facility];  Service: Cardiovascular;  Laterality: N/A;   COLONOSCOPY     EYE SURGERY Bilateral    cataracts   EYE SURGERY Left    retina tear   FEMUR IM NAIL  10/25/2012   Procedure: INTRAMEDULLARY (IM) NAIL FEMORAL;  Surgeon: Donnice JONETTA Car, MD;  Location: [facility] OR[facility];  Service: Orthopedics;  Laterality: Left;   HIP SURGERY     Fracture L IM nail and 3 rods   left knee arthroscopy  12/2010   LYMPH NODE BIOPSY     axillary left 12-2018   REFRACTIVE SURGERY     skin cancer removed right and and lower forearm  squamoun in situ   TOTAL KNEE ARTHROPLASTY  12/20/2011   Procedure: TOTAL KNEE ARTHROPLASTY;  Surgeon: Dempsey LULLA Moan, MD;  Location: [facility];  Service: Orthopedics;  Laterality: Left;  Failed attempt at spinal    TOTAL SHOULDER ARTHROPLASTY Right 08/19/2016   Procedure: RIGHT TOTAL SHOULDER ARTHROPLASTY;  Surgeon: Franky Pointer, MD;  Location: [facility] OR;  Service: Orthopedics;  Laterality: Right;   TOTAL SHOULDER ARTHROPLASTY Left 01/10/2020   Procedure: TOTAL SHOULDER ARTHROPLASTY;  Surgeon: Pointer Franky, MD;  Location: [facility];  Service: Orthopedics;  Laterality: Left;    TRANSESOPHAGEAL ECHOCARDIOGRAM (CATH  LAB) N/A 10/11/2023   Procedure: TRANSESOPHAGEAL ECHOCARDIOGRAM;  Surgeon: Nancey Eulas BRAVO, MD;  [facility]: [facility];  Service: Cardiovascular;  Laterality: N/A;    SOCIAL HISTORY: Social History   Socioeconomic History   Marital status: Single    Spouse name: Not on file   Number of children: Not on file   Years of education: Not on file   Highest education level: Bachelor's degree (e.g., BA, AB, BS)  Occupational History   Occupation: Charity fundraiser at [employer]    Employer:  [employer]  Tobacco Use   Smoking status: Never   Smokeless tobacco: Never   Tobacco comments:    Never smoked 11/08/23  Vaping Use   Vaping status: Never Used  Substance and Sexual Activity   Alcohol use: No   Drug use: No   Sexual activity: Not Currently  Other Topics Concern   Not on file  Social History Narrative   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   Social Drivers of Health   Financial Resource Strain: Low Risk  (05/14/2024)   Overall Financial Resource Strain (CARDIA)    Difficulty of Paying Living Expenses: Not very hard  Food Insecurity: No Food Insecurity (05/14/2024)   Hunger Vital Sign    Worried About Running Out of Food in the Last Year: Never true    Ran Out of Food in the Last Year: Never true  Transportation Needs: No Transportation Needs (05/14/2024)   PRAPARE - Administrator, Civil Service (Medical): No    Lack of Transportation (Non-Medical): No  Physical Activity: Insufficiently Active (05/14/2024)   Exercise Vital Sign    Days of Exercise per Week: 2 days    Minutes of Exercise per Session: 20 min  Stress: Stress Concern Present (05/14/2024)   [employer] of Occupational Health - Occupational Stress Questionnaire    Feeling of Stress: Very much  Social Connections: Unknown (05/14/2024)   Social Connection and Isolation Panel    Frequency of Communication with Friends and Family: Twice a week    Frequency of Social Gatherings with Friends and Family:  Patient declined    Attends Religious Services: More than 4 times per year    Active Member of [employer] or Organizations: Yes    Attends Banker Meetings: Patient declined    Marital Status: Never married  Recent Concern: Social Connections - Moderately Isolated (04/02/2024)   Social Connection and Isolation Panel    Frequency of Communication with Friends and Family: Never    Frequency of Social Gatherings with Friends and Family: Once a week    Attends Religious Services: More than 4 times per year    Active Member of [employer] or Organizations: No    Attends Banker Meetings: 1 to 4 times per year    Marital Status: Never married  Intimate Partner Violence: Not At Risk (04/02/2024)  Humiliation, Afraid, Rape, and Kick questionnaire    Fear of Current or Ex-Partner: No    Emotionally Abused: No    Physically Abused: No    Sexually Abused: No    FAMILY HISTORY: Family History  Problem Relation Age of Onset   Cancer Mother        breast cancer   Cancer Father        plasma sarcoma   Breast cancer Neg Hx     ALLERGIES:  is allergic to carvedilol , gadolinium derivatives, nsaids, amiodarone , amlodipine , beta adrenergic blockers, contrast media [iodinated contrast media], and gabapentin .  MEDICATIONS:  Current Outpatient Medications  Medication Sig Dispense Refill   acetaminophen  (TYLENOL ) 500 MG tablet Take 500-1,000 mg by mouth every 6 (six) hours as needed for mild pain, moderate pain or fever.      ALPRAZolam  (XANAX ) 1 MG tablet TAKE 1 TABLET(1 MG) BY MOUTH TWICE DAILY AS NEEDED FOR ANXIETY 60 tablet 5   amoxicillin (AMOXIL) 500 MG capsule Take 2,000 mg by mouth See admin instructions. 1 hour before dental appointment     Blood Glucose Monitoring Suppl (ONETOUCH VERIO) w/Device KIT Use as advised 1 kit 0   buPROPion  (WELLBUTRIN  XL) 300 MG 24 hr tablet TAKE 1 TABLET(300 MG) BY MOUTH DAILY 90 tablet 3   Cholecalciferol  (VITAMIN D3) 2000 units TABS Take 2,000 mcg  by mouth daily.     Cyanocobalamin  (B-12) 1000 MCG SUBL Place 1,000 mcg under the tongue every evening.     doxazosin  (CARDURA ) 2 MG tablet TAKE 1 TABLET(2 MG) BY MOUTH EVERY EVENING 90 tablet 3   ELIQUIS  5 MG TABS tablet TAKE 1 TABLET(5 MG) BY MOUTH TWICE DAILY 60 tablet 5   esomeprazole (NEXIUM) 20 MG capsule Take 20 mg by mouth daily.     ezetimibe  (ZETIA ) 10 MG tablet Take 1 tablet (10 mg total) by mouth daily. 90 tablet 3   furosemide  (LASIX ) 20 MG tablet Take 20 mg by mouth as needed.     glucose blood (ONETOUCH VERIO) test strip USE TO TEST BLOOD GLUCOSE THREE TIMES DAILY. 300 strip 5   insulin  glargine (LANTUS  SOLOSTAR) 100 UNIT/ML Solostar Pen Inject under skin 20-24 units after dinner 30 mL 3   Insulin  Pen Needle (BD PEN NEEDLE NANO 2ND GEN) 32G X 4 MM MISC Use to inject insulin  daily 100 each 3   iron  polysaccharides (NIFEREX) 150 MG capsule Take 150 mg by mouth daily.     JARDIANCE  10 MG TABS tablet TAKE 1 TABLET(10 MG) BY MOUTH DAILY BEFORE BREAKFAST 30 tablet 11   Lancet Devices (LANCING DEVICE) MISC Use as advised - Freestyle 1 each 0   Lancets (ONETOUCH DELICA PLUS LANCET30G) MISC USE TO TEST BLOOD GLUCOSE THREE TIMES DAILY AS DIRECTED 300 each 1   lisinopril  (ZESTRIL ) 20 MG tablet Take 1 tablet (20 mg total) by mouth in the morning and at bedtime. 180 tablet 3   metoprolol  succinate (TOPROL  XL) 25 MG 24 hr tablet Take 1 tablet (25 mg total) by mouth 2 (two) times daily. Take with or immediately following a meal. 180 tablet 2   NONFORMULARY OR COMPOUNDED ITEM Apply 1 application  topically 2 (two) times daily as needed (sun damage on face). FLUOROURACIL 5% + CALCIPOTRIENE 0.005%     simvastatin  (ZOCOR ) 40 MG tablet TAKE 1 TABLET(40 MG) BY MOUTH AT BEDTIME 90 tablet 3   No current facility-administered medications for this visit.    REVIEW OF SYSTEMS:   .10 Point review of  Systems was done is negative except as noted above.   PHYSICAL EXAMINATION: ECOG PERFORMANCE STATUS: 1  - Symptomatic but completely ambulatory  Vitals:   07/11/24 0930  BP: (!) 139/52  Pulse: 61  Resp: 18  Temp: 97.7 F (36.5 C)  SpO2: 94%   Filed Weights   07/11/24 0930  Weight: 181 lb 6.4 oz (82.3 kg)   .Body mass index is 35.43 kg/m. GENERAL:alert, in no acute distress and comfortable SKIN: no acute rashes, no significant lesions EYES: conjunctiva are pink and non-injected, sclera anicteric OROPHARYNX: MMM, no exudates, no oropharyngeal erythema or ulceration NECK: supple, no JVD LYMPH:  no palpable lymphadenopathy in the cervical, axillary or inguinal regions LUNGS: clear to auscultation b/l with normal respiratory effort HEART: regular rate & rhythm ABDOMEN:  normoactive bowel sounds , non tender, not distended. Extremity: no pedal edema PSYCH: alert & oriented x 3 with fluent speech NEURO: no focal motor/sensory deficits   LABORATORY DATA:  I have reviewed the data as listed  .    Latest Ref Rng & Units 07/11/2024    8:30 AM 01/06/2024    8:25 AM 09/19/2023    8:01 AM  CBC  WBC 4.0 - 10.5 K/uL 14.3  15.5  28.5   Hemoglobin 12.0 - 15.0 g/dL 86.9  87.3  86.5   Hematocrit 36.0 - 46.0 % 42.1  40.6  42.7   Platelets 150 - 400 K/uL 127  135  161    CBC    Component Value Date/Time   WBC 14.3 (H) 07/11/2024 0830   WBC 20.4 (H) 06/19/2023 0818   RBC 4.58 07/11/2024 0830   HGB 13.0 07/11/2024 0830   HGB 13.4 09/19/2023 0801   HCT 42.1 07/11/2024 0830   HCT 42.7 09/19/2023 0801   PLT 127 (L) 07/11/2024 0830   PLT 161 09/19/2023 0801   MCV 91.9 07/11/2024 0830   MCV 92 09/19/2023 0801   MCH 28.4 07/11/2024 0830   MCHC 30.9 07/11/2024 0830   RDW 13.2 07/11/2024 0830   RDW 13.3 09/19/2023 0801   LYMPHSABS 9.1 (H) 07/11/2024 0830   LYMPHSABS 19.6 (H) 12/30/2022 0907   MONOABS 1.4 (H) 07/11/2024 0830   EOSABS 0.3 07/11/2024 0830   EOSABS 0.2 12/30/2022 0907   BASOSABS 0.1 07/11/2024 0830   BASOSABS 0.1 12/30/2022 0907       Latest Ref Rng & Units 07/11/2024     8:30 AM 01/06/2024    8:25 AM 09/19/2023    8:04 AM  CMP  Glucose 70 - 99 mg/dL 75  882  898   BUN 8 - 23 mg/dL 24  40  33   Creatinine 0.44 - 1.00 mg/dL 8.49  8.54  8.24   Sodium 135 - 145 mmol/L 141  140  141   Potassium 3.5 - 5.1 mmol/L 5.1  4.4  5.2   Chloride 98 - 111 mmol/L 106  108  102   CO2 22 - 32 mmol/L 30  27  23    Calcium  8.9 - 10.3 mg/dL 9.4  9.1  9.5   Total Protein 6.5 - 8.1 g/dL 6.3  5.8    Total Bilirubin 0.0 - 1.2 mg/dL 0.7  0.5    Alkaline Phos 38 - 126 U/L 76  83    AST 15 - 41 U/L 14  13    ALT 0 - 44 U/L 9  7     Lab Results  Component Value Date   LDH 168 07/11/2024  12/15/17 FISH CLL Prognostic Panel:    01/03/19 Left axillary LN biopsy:    RADIOGRAPHIC STUDIES: I have personally reviewed the radiological images as listed and agreed with the findings in the report. No results found.   ASSESSMENT & PLAN:   Brenda [name] is a 73 y.o. 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 Lab Results  Component Value Date   LDH 168 07/11/2024   05/01/18 CT C/A/P revealed Mildly progressive lymphadenopathy in the chest, abdomen, and pelvis, corresponding to the patient's known CLL, as above. Dominant left external iliac node measures 3.3 cm short axis, previously 2.8cm. Spleen is normal in size. Numerous bilateral pulmonary nodules measuring up to 9 mm, grossly unchanged from 2017. Prior bilateral pleural effusions have resolved.   09/22/18 US  Bilateral Axilla revealed Ultrasound is performed, showing numerous enlarged LEFT axillary lymph nodes. Largest lymph node in the LEFT axilla has cortical thickening of 9 millimeters. Largest lymph node is 3.3 centimeters in length. Evaluation of the contralateral axilla for  comparison demonstrate lymph nodes with cortical thickening. Largest lymph node in the RIGHT axilla is 3.4 x 1.3 centimeters.  01/03/19 Left axillary LN biopsy revealed CLL  2. Multiple pulmonary nodules   CT AP on 11/17/17 - with 1. Pelvic lymphadenopathy and innumerable pulmonary nodules in the lung bases, consistent with metastatic disease. 2. Cholelithiasis without CT evidence of acute cholecystitis. 3.  Aortic atherosclerosis  CT Chest on 11/20/17 - with multiple small lung nodules in LLL that appear stable from prior CT in 12/2015 and are considered benign and Upper lung nodules also appear benign. Also with several borderline enlarged lymph nodes concerning for a lymphoproliferative disorder   3. H/o endometrial cancer in 1998 -localized to uterus -treated with surgery, abdominal hysterectomy  -no evidence of recurrence  4. Microcytic anemia - Fe deficiency  Presented in hospital with Hgb of 6.6 Hgb holding steady s/p 2U PRBC - Fe infusion completed 1/19 - stool hemoccult negative - suspect this may be due to CKD,  5. Marginal B12 levels in hospital  (Antiparietal celll and anti IF Ab negative) -given one B12 injection in hospital -on replacement  PLAN:  Discussed lab results from today 07/11/2024 in detail with the patient. CBC shows normal hemoglobin of 13, stable WBC count of 14.3k and stable mild thrombocytopenia at 127k CMP shows stable chronic kidney disease with creatinine of 1.5 LDH within normal limits at 168. Patient has no lab or clinical evidence of significant symptomatic CLL progression at this time. No constitutional symptoms. No indications to initiate treatment for CLL at this time. Patient counseled to follow-up with PCP for age-appropriate cancer screening and vaccinations.  FOLLOW UP:  Return to clinic with Dr. Onesimo with labs in 6 months  .The total time spent in the appointment was 21 minutes* .  All of the patient's questions were answered with  apparent satisfaction. The patient knows to call the clinic with any problems, questions or concerns.   Emaline Onesimo MD MS AAHIVMS [facility] Hematology/Oncology Physician [facility]  .*Total Encounter Time as defined by the Centers for Medicare and Medicaid Services includes, in addition to the face-to-face time of a patient visit (documented in the note above) non-face-to-face time: obtaining and reviewing outside history, ordering and reviewing medications, tests or  procedures, care coordination (communications with other health care professionals or caregivers) and documentation in the medical record.

## 2024-07-19 NOTE — Progress Notes (Unsigned)
 Cardiology Office Note:    Date:  07/20/2024   ID:  Brenda [name] 02/14/1951, MRN [id number]  PCP:  Brenda Almarie LABOR, MD   [facility] Providers Cardiologist:  Evalene Lunger, MD Electrophysiologist:  Eulas FORBES Furbish, MD     Referring MD: Brenda [name], *    History of Present Illness:    Brenda [name] is a 73 y.o. female with a hx listed below pertinent for CAD, DM, HTN, persistent AF, CKD IIIb-IV referred for arrhythmia management.  She was diagnosed with atrial fibrillation in May 2022 and placed on metoprolol  and eliquis .  She was started on amiodarone  in June 2023.  In September 2023, she complained of lightheadedness and dizziness.  A Zio patch was placed that showed sinus pauses up to about 8 seconds, so her Coreg  and amiodarone  were discontinued.  Additionally, her TSH was elevated at 11.7.  Pacemaker placement was deferred with a reversible cause.  Monitor placed and follow-up did not show any atrial fibrillation or significant bradycardia.  She has had sinus bradycardia with metoprolol  years ago.  She reports that she has been doing very well since wearing the monitor.  She records her heart rate 4 times a day, and these have been running between 70 and 90 bpm.  She notes that when she did have atrial fibrillation, her primary symptom was fatigue.  She did not have significant palpitations.  She could appreciate a regular rhythm during pulse checks.  She is a retired Engineer, civil (consulting).  She underwent atrial fibrillation ablation on January 14, 2023 and did well well for some time. She had a few recurrences of atrial fibrillation and August 2024 and underwent cardioversion in the ER.  She was not adequately sedated and felt the shock.  She underwent repeat ablation in December 2024 with reablation of the pulmonary veins, ablation of posterior wall, ablation of the anterior mitral isthmus line, and ablation of a typical CTI dependent flutter.  She had a  difficult time recovering from the ablation.  Atrial fibrillation recurred in March 2025 and she underwent a DC cardioversion.  Since, she has remained in normal sinus rhythm.     EKGs/Labs/Other Studies Reviewed:     EKG Interpretation Date/Time:  Friday July 20 2024 08:33:32 EDT Ventricular Rate:  66 PR Interval:  194 QRS Duration:  88 QT Interval:  408 QTC Calculation: 427 R Axis:   110  Text Interpretation: Normal sinus rhythm Low voltage QRS Left posterior fascicular block When compared with ECG of 22-Mar-2024 11:35, axis has changed Confirmed by Furbish Eulas [phone]) on 07/20/2024 8:38:22 AM     Recent Labs: 08/18/2023: TSH 3.40 07/11/2024: ALT 9; BUN 24; Creatinine 1.50; Hemoglobin 13.0; Platelet Count 127; Potassium 5.1; Sodium 141       Physical Exam:    VS:  BP 120/80 (BP Location: Right Arm, Patient Position: Sitting, Cuff Size: Normal)   Pulse 67   Ht 5' (1.524 m)   Wt 180 lb (81.6 kg)   SpO2 96%   BMI 35.15 kg/m     Wt Readings from Last 3 Encounters:  07/20/24 180 lb (81.6 kg)  07/11/24 181 lb 6.4 oz (82.3 kg)  05/15/24 178 lb (80.7 kg)     GEN:  Well nourished, well developed in no acute distress CARDIAC: RRR, no murmurs, rubs, gallops RESPIRATORY:  Normal work of breathing MUSCULOSKELETAL: no edema    ASSESSMENT & PLAN:    Paroxysmal atrial fibrillation:  she notes fatigue when in  AF, rate control can be difficult.  not a candidate for most AADs due to renal dysfunction.  S/p repeat ablation 10/21/2023 -- re-do PVI with posterior wall, mitral isthmus line, typical flutter line -- the procedure was difficult Recurrence of AF in 12/2023 requiring cardioversion I do not think she would be a good candidate for Tikosyn due to tenuous renal function If she has recurrence, I think another cardioversion would be worthwhile, but with frequent recurrences I would likely pursue a rate control approach, possibly pacemaker and AVJ ablation Continue  diltiazem  60 mg as needed for RVR    Secondary hypercoagulable state: Continue apixaban  5mg  PO BID  Obesity: I reinforced her weight loss efforts.  DM II  CKD IV: precludes use of most AADs.          Medication Adjustments/Labs and Tests Ordered: Current medicines are reviewed at length with the patient today.  Concerns regarding medicines are outlined above.  Orders Placed This Encounter  Procedures   EKG 12-Lead   No orders of the defined types were placed in this encounter.    Signed, Eulas FORBES Furbish, MD  07/20/2024 8:39 AM    [facility]

## 2024-07-20 ENCOUNTER — Encounter: Payer: Self-pay | Admitting: Cardiovascular Disease

## 2024-07-20 ENCOUNTER — Ambulatory Visit: Attending: Cardiovascular Disease | Admitting: Cardiovascular Disease

## 2024-07-20 VITALS — BP 120/80 | HR 67 | Ht 60.0 in | Wt 180.0 lb

## 2024-07-20 DIAGNOSIS — I484 Atypical atrial flutter: Secondary | ICD-10-CM | POA: Insufficient documentation

## 2024-07-20 DIAGNOSIS — I4819 Other persistent atrial fibrillation: Secondary | ICD-10-CM | POA: Diagnosis not present

## 2024-07-20 DIAGNOSIS — I455 Other specified heart block: Secondary | ICD-10-CM | POA: Insufficient documentation

## 2024-07-20 DIAGNOSIS — I495 Sick sinus syndrome: Secondary | ICD-10-CM | POA: Diagnosis not present

## 2024-07-20 NOTE — Patient Instructions (Signed)
 Medication Instructions:  Your physician recommends that you continue on your current medications as directed. Please refer to the Current Medication list given to you today.  *If you need a refill on your cardiac medications before your next appointment, please call your pharmacy*  Lab Work: None ordered  Testing/Procedures: None ordered  Follow-Up: At [facility], you and your health needs are our priority.  As part of our continuing mission to provide you with exceptional heart care, our providers are all part of one team.  This team includes your primary Cardiologist (physician) and Advanced Practice Providers or APPs (Physician Assistants and Nurse Practitioners) who all work together to provide you with the care you need, when you need it.  Your next appointment:   6 month(s)  Provider:   You will see one of the following Advanced Practice Providers on your designated Care Team:   Charlies Arthur, PA-C Vanriper Andy Tillery, PA-C Suzann Riddle, NP Daphne Barrack, NP Artist Pouch, PA-C   Thank you for choosing [facility]!!   [phone]

## 2024-08-21 DIAGNOSIS — N1832 Chronic kidney disease, stage 3b: Secondary | ICD-10-CM | POA: Diagnosis not present

## 2024-08-21 DIAGNOSIS — I129 Hypertensive chronic kidney disease with stage 1 through stage 4 chronic kidney disease, or unspecified chronic kidney disease: Secondary | ICD-10-CM | POA: Diagnosis not present

## 2024-08-21 DIAGNOSIS — I4891 Unspecified atrial fibrillation: Secondary | ICD-10-CM | POA: Diagnosis not present

## 2024-08-21 DIAGNOSIS — G4700 Insomnia, unspecified: Secondary | ICD-10-CM | POA: Diagnosis not present

## 2024-08-21 DIAGNOSIS — E1122 Type 2 diabetes mellitus with diabetic chronic kidney disease: Secondary | ICD-10-CM | POA: Diagnosis not present

## 2024-08-21 DIAGNOSIS — C9110 Chronic lymphocytic leukemia of B-cell type not having achieved remission: Secondary | ICD-10-CM | POA: Diagnosis not present

## 2024-08-22 ENCOUNTER — Other Ambulatory Visit (HOSPITAL_COMMUNITY): Payer: Self-pay | Admitting: *Deleted

## 2024-08-22 MED ORDER — METOPROLOL SUCCINATE ER 25 MG PO TB24: 25.0000 mg | ORAL_TABLET | Freq: Two times a day (BID) | ORAL | 2 refills | Status: AC

## 2024-09-02 ENCOUNTER — Other Ambulatory Visit: Payer: Self-pay | Admitting: Cardiovascular Disease

## 2024-09-02 DIAGNOSIS — I4819 Other persistent atrial fibrillation: Secondary | ICD-10-CM

## 2024-09-03 NOTE — Telephone Encounter (Signed)
 Prescription refill request for Eliquis  received. Indication:afib Last office visit:9/25 Scr:1.50  9/25 Age: 73 Weight:81.6  kg  Prescription refilled

## 2024-09-04 ENCOUNTER — Encounter: Payer: Self-pay | Admitting: Internal Medicine

## 2024-09-04 ENCOUNTER — Other Ambulatory Visit

## 2024-09-04 ENCOUNTER — Ambulatory Visit (INDEPENDENT_AMBULATORY_CARE_PROVIDER_SITE_OTHER): Admitting: Internal Medicine

## 2024-09-04 VITALS — BP 120/60 | HR 94 | Ht 60.0 in | Wt 184.4 lb

## 2024-09-04 DIAGNOSIS — E785 Hyperlipidemia, unspecified: Secondary | ICD-10-CM | POA: Diagnosis not present

## 2024-09-04 DIAGNOSIS — E66812 Obesity, class 2: Secondary | ICD-10-CM

## 2024-09-04 DIAGNOSIS — E032 Hypothyroidism due to medicaments and other exogenous substances: Secondary | ICD-10-CM

## 2024-09-04 DIAGNOSIS — Z794 Long term (current) use of insulin: Secondary | ICD-10-CM

## 2024-09-04 DIAGNOSIS — E1122 Type 2 diabetes mellitus with diabetic chronic kidney disease: Secondary | ICD-10-CM

## 2024-09-04 DIAGNOSIS — Z6835 Body mass index (BMI) 35.0-35.9, adult: Secondary | ICD-10-CM

## 2024-09-04 DIAGNOSIS — E1169 Type 2 diabetes mellitus with other specified complication: Secondary | ICD-10-CM

## 2024-09-04 LAB — POCT GLYCOSYLATED HEMOGLOBIN (HGB A1C): Hemoglobin A1C: 6.2 % — AB (ref 4.0–5.6)

## 2024-09-04 NOTE — Patient Instructions (Addendum)
 Please continue: - Jardiance  10 mg before b'fast   Try to decrease: - Lantus  18 units after dinner   Please return in 4 months with your sugar log.

## 2024-09-04 NOTE — Progress Notes (Signed)
 Patient ID: Brenda [name], female   DOB: May 07, 1951, 73 y.o.   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 Last visit 4 months ago.  Interim history: No increased urination,blurry vision, chest pain.  No nausea occasionally. She has SOB with exertion. She is trying to prepare her house for selling - very stressful and also physically challenging..  She is looking into independent living facilities.  DM2: Reviewed HbA1c levels: Lab Results  Component Value Date   HGBA1C 6.4 (A) 04/30/2024   HGBA1C 6.1 (A) 12/20/2023   HGBA1C 6.2 (A) 08/18/2023   Patient is on: - Lantus  12 units in a.m.- started 05/2019 by PCP >> 24 >> 18-20 >> 16-18 >> 18-20 >> 20 units after dinner - Jardiance  10 before breakfast-added 11/2020 She could not start Ozempic since our visit in 08/2023 due to price. She was on Glipizide  in 11/2015 after a steroid inj. We stopped Victoza  due to nausea, dehydration. Metformin  ER was stopped by PCP due to CKD. Glipizide  ER was stopped 06/2019 due to low blood sugars. She was previously on glipizide  XL but stopped 05/2022.  -Pt checks her sugars twice a day: - am: 73-130, 136 >> 78-126, 140, 170 >> 71-128, 138 - 2h after b'fast: 156 >> n/c >> 148 >> n/c >> 134 >> n/c - before lunch: 78-125, 140, 145 >> 70-124, 134 >> 72-120, 128 - 2h after lunch: 118, 123 >> 92, 99 >> n/c >> 145 >> n/c  - before dinner: 80-136, 140, 150 >> 88-132, 140, 168 >> 75-132, 140 - 2h after dinner: 138, 148 >> n/c >> 133 >> n/c - bedtime: 93-115 >> n/c >> 108 >> n/c >> 119 >> 155 >> 120 - nighttime: n/c Lowest sugar was 37 x1 >> ...  70 >> 71; she has hypoglycemia awareness in the 70s. Highest sugar was  150 >> 170 >> 140.  Glucometer: Freestyle Lite  Pt's meals are: - Breakfast: English  muffin + preserve or cereal or yoghurt >> fresh fruit + whole wheat bread/muffin/waffle + sugar free syrup or preserve - snack: fruit or fruit + yoghurt >> skips - Lunch: 1/2 sandwich + chips + salsa + fruit/sugar free >> protein bar >> skips - Dinner: soup or chicken/steak + veggies, occasional starch or Lean cuisine or light salad No soft drinks.  -+ CKD - sees nephrology (Dr. Gearline), last BUN/creatinine:  Lab Results  Component Value Date   BUN 24 (H) 07/11/2024   CREATININE 1.50 (H) 07/11/2024  On benazepril .   She had a high potassium (5.9) >> used Kayexalate  x3 >> normalized.  She is on a low potassium diet.  -+ HL last set of lipids: Lab Results  Component Value Date   CHOL 121 08/18/2023   HDL 31.10 (L) 08/18/2023   LDLCALC 69 08/18/2023   LDLDIRECT 55.0 05/15/2020   TRIG 108.0 08/18/2023   CHOLHDL 4 08/18/2023  On Zocor  40 and Zetia  10.  - last eye exam 04/13/2024: No DR; she had cataract surgery in 2016.  She has a history of retinal tear.  - no numbness and tingling in her feet.  She takes Neurontin  for leg cramps at night.  Last foot exam 04/30/2024.  Acquired hypothyroidism: -Likely related to amiodarone  treatment >> now off definitively  Reviewed her TFTs: Lab Results  Component Value Date   TSH 3.40 08/18/2023  TSH 3.833 06/19/2023   TSH 4.67 04/25/2023   TSH 4.99 12/28/2022   TSH 8.742 (H) 11/17/2022   TSH 22.801 (H) 09/27/2022   TSH 11.721 (H) 07/30/2022   TSH 2.453 04/19/2022   TSH 2.640 04/14/2021   TSH 1.643 11/19/2017   TSH 2.67 03/07/2017   Lab Results  Component Value Date   FREET4 1.01 08/18/2023   FREET4 0.88 06/19/2023   FREET4 1.00 04/25/2023   FREET4 0.77 12/28/2022   FREET4 0.77 11/17/2022   FREET4 0.55 (L) 09/27/2022   T3FREE 3.2 08/18/2023   T3FREE 3.0 11/17/2022   She was started on levothyroxine  25 mcg daily - started 09/2022.  However, we were able to stop the medication in 04/2023.  She also has atrial fibrillation  >> had  cardioversions but  not successful.  She cardiac ablation. She also has HTN, GERD, depression. She was admitted 11/2017 for dehydration, anemia, and acute renal failure >> diagnosed with CLL. She has lifelong insomnia.  In the past, she was waking up at 3 AM to go to work at 5:30 AM.   ROS: + see HPI  I reviewed pt's medications, allergies, PMH, social hx, family hx, and changes were documented in the history of present illness. Otherwise, unchanged from my initial visit note.  Past Medical History:  Diagnosis Date   Anxiety    Arthritis    Atrial fibrillation (HCC)    CLL (chronic lymphocytic leukemia) (HCC)    Dx. 2019 Dr. 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 Denver first visit 11-2019   Past Surgical History:  Procedure Laterality Date   ABDOMINAL HYSTERECTOMY  1998   ATRIAL FIBRILLATION ABLATION N/A 01/14/2023   Procedure: ATRIAL FIBRILLATION ABLATION;  Surgeon: Nancey Eulas BRAVO, MD;  Location: [facility];  Service: Cardiovascular;  Laterality: N/A;   ATRIAL FIBRILLATION ABLATION N/A 10/11/2023   Procedure: ATRIAL FIBRILLATION ABLATION;  Surgeon: Nancey Eulas BRAVO, MD;  Location: [facility];  Service:  Cardiovascular;  Laterality: N/A;   CARDIOVERSION N/A 08/24/2023   Procedure: CARDIOVERSION;  Surgeon: Shlomo Wilbert SAUNDERS, MD;  Location: [facility];  Service: Cardiovascular;  Laterality: N/A;   CARDIOVERSION N/A 02/03/2024   Procedure: CARDIOVERSION;  Surgeon: Delford Maude BROCKS, MD;  Location: [facility];  Service: Cardiovascular;  Laterality: N/A;   COLONOSCOPY     EYE SURGERY Bilateral    cataracts   EYE SURGERY Left    retina tear   FEMUR IM NAIL  10/25/2012   Procedure: INTRAMEDULLARY (IM) NAIL  FEMORAL;  Surgeon: Donnice JONETTA Car, MD;  Location: [facility];  Service: Orthopedics;  Laterality: Left;   HIP SURGERY     Fracture L IM nail and 3 rods   left knee arthroscopy  12/2010   LYMPH NODE BIOPSY     axillary left 12-2018   REFRACTIVE SURGERY     skin cancer removed right and and lower forearm     squamoun in situ   TOTAL KNEE ARTHROPLASTY  12/20/2011   Procedure: TOTAL KNEE ARTHROPLASTY;  Surgeon: Dempsey LULLA Moan, MD;  Location: [facility];  Service: Orthopedics;  Laterality: Left;  Failed attempt at spinal    TOTAL SHOULDER ARTHROPLASTY Right 08/19/2016   Procedure: RIGHT TOTAL SHOULDER ARTHROPLASTY;  Surgeon: Franky Pointer, MD;  Location: [facility];  Service: Orthopedics;  Laterality: Right;   TOTAL SHOULDER ARTHROPLASTY Left 01/10/2020   Procedure: TOTAL SHOULDER ARTHROPLASTY;  Surgeon: Pointer Franky, MD;  Location: [facility];  Service: Orthopedics;  Laterality: Left;    TRANSESOPHAGEAL ECHOCARDIOGRAM (CATH LAB) N/A 10/11/2023   Procedure: TRANSESOPHAGEAL ECHOCARDIOGRAM;  Surgeon: Nancey Eulas BRAVO, MD;  Location: [facility];  Service: Cardiovascular;  Laterality: N/A;   Social History   Social History   Marital Status: Single    Spouse Name: N/A   Number of Children: 0   Occupational History   [employer] at [employer]    Social History Main Topics   Smoking status: Never Smoker    Smokeless tobacco: Never Used   Alcohol Use: No   Drug Use: No   Social History  Designer, 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      ALPRAZolam  (XANAX ) 1 MG tablet TAKE 1 TABLET(1 MG) BY MOUTH TWICE DAILY AS NEEDED FOR ANXIETY 60 tablet 5   amoxicillin (AMOXIL) 500 MG capsule Take 2,000 mg by mouth See admin instructions. 1 hour before dental appointment     Blood Glucose Monitoring Suppl (ONETOUCH VERIO) w/Device KIT Use as advised 1 kit 0   buPROPion  (WELLBUTRIN  XL) 300 MG 24 hr tablet TAKE 1 TABLET(300 MG) BY MOUTH DAILY 90 tablet 3   Cholecalciferol  (VITAMIN D3) 2000 units TABS Take 2,000 mcg by mouth daily.     Cyanocobalamin  (B-12) 1000 MCG SUBL Place 1,000 mcg under the tongue every evening.     doxazosin  (CARDURA ) 2 MG tablet TAKE 1 TABLET(2 MG) BY MOUTH EVERY EVENING 90 tablet 3   ELIQUIS  5 MG TABS tablet TAKE 1 TABLET(5 MG) BY MOUTH TWICE DAILY 60 tablet 5   esomeprazole (NEXIUM) 20 MG capsule Take 20 mg by mouth daily.     ezetimibe  (ZETIA ) 10 MG tablet Take 1 tablet (10 mg total) by mouth daily. 90 tablet 3   furosemide  (LASIX ) 20 MG tablet Take 20 mg by mouth as needed.     glucose blood (ONETOUCH VERIO) test strip USE TO TEST BLOOD GLUCOSE THREE TIMES DAILY. 300 strip 5   insulin  glargine (LANTUS  SOLOSTAR) 100 UNIT/ML Solostar Pen Inject under skin 20-24 units after dinner 30 mL 3   Insulin  Pen Needle (BD PEN NEEDLE NANO 2ND GEN) 32G X 4 MM MISC Use to inject insulin  daily 100 each 3   iron  polysaccharides (NIFEREX) 150 MG capsule Take 150 mg by mouth daily.  JARDIANCE  10 MG TABS tablet TAKE 1 TABLET(10 MG) BY MOUTH DAILY BEFORE BREAKFAST 30 tablet 11   Lancet Devices (LANCING DEVICE) MISC Use as advised - Freestyle 1 each 0   Lancets (ONETOUCH DELICA PLUS LANCET30G) MISC USE TO TEST BLOOD GLUCOSE THREE TIMES DAILY AS DIRECTED 300 each 1   lisinopril   (ZESTRIL ) 20 MG tablet Take 1 tablet (20 mg total) by mouth in the morning and at bedtime. 180 tablet 3   metoprolol  succinate (TOPROL  XL) 25 MG 24 hr tablet Take 1 tablet (25 mg total) by mouth 2 (two) times daily. Take with or immediately following a meal.Take 1 tablet (25 mg total) by mouth 2 (two) times daily. Take with or immediately following a meal. 180 tablet 2   NONFORMULARY OR COMPOUNDED ITEM Apply 1 application  topically 2 (two) times daily as needed (sun damage on face). FLUOROURACIL 5% + CALCIPOTRIENE 0.005%     simvastatin  (ZOCOR ) 40 MG tablet TAKE 1 TABLET(40 MG) BY MOUTH AT BEDTIME 90 tablet 3   No current facility-administered medications on file prior to visit.   Allergies  Allergen Reactions   Carvedilol  Other (See Comments)    Sinus Pauses   Gadolinium Derivatives Other (See Comments)    Effects Kidney Function    Nsaids Other (See Comments)    Due to kidney function   Amiodarone  Other (See Comments)    Causes bradycardia and decreased thyroid  function   Amlodipine      Swelling-  leg swell    Beta Adrenergic Blockers     Decreased heart rate   Contrast Media [Iodinated Contrast Media] Other (See Comments)    Patient states unable to take / use due to kidney function, NOT allergy!   Gabapentin  Other (See Comments)    confusion   Family History  Problem Relation Age of Onset   Cancer Mother        breast cancer   Cancer Father        plasma sarcoma   Breast cancer Neg Hx    PE: BP 120/60   Pulse 94   Ht 5' (1.524 m)   Wt 184 lb 6.4 oz (83.6 kg)   SpO2 95%   BMI 36.01 kg/m   Wt Readings from Last 15 Encounters:  09/04/24 184 lb 6.4 oz (83.6 kg)  07/20/24 180 lb (81.6 kg)  07/11/24 181 lb 6.4 oz (82.3 kg)  05/15/24 178 lb (80.7 kg)  04/30/24 177 lb 6.4 oz (80.5 kg)  04/02/24 170 lb (77.1 kg)  03/22/24 172 lb 2 oz (78.1 kg)  02/10/24 171 lb (77.6 kg)  01/30/24 171 lb (77.6 kg)  01/09/24 169 lb (76.7 kg)  01/06/24 167 lb (75.8 kg)  12/20/23 167 lb  6.4 oz (75.9 kg)  11/16/23 171 lb (77.6 kg)  11/08/23 168 lb 9.6 oz (76.5 kg)  10/11/23 159 lb (72.1 kg)   Constitutional: overweight, in NAD Eyes: EOMI, no exophthalmos ENT: no thyromegaly, no cervical lymphadenopathy Cardiovascular: tachycardia, RR, No MRG. + L> RLE swelling, pitting edema Respiratory: CTA B Musculoskeletal: no deformities Skin: no rashes Neurological: no tremor with outstretched hands  Assessment: 1. DM2, insulin -dependent, uncontrolled, with complications  - mild CKD  2. Obesity class 2  3. HL  4.  Amiodarone -induced hypothyroidism - resolved  PLAN:  1. Patient with longstanding, previously uncontrolled, with better control lately.  HbA1c at last visit was 6.4%, at goal.  She continues on SGLT2 inhibitor and long-acting insulin .  She previously declined a GLP-1 receptor agonist  due to fear of GI side effects as she previously had nausea with Victoza .  However, afterwards, she agreed to try Ozempic, but this was unfortunately not covered for her.  At last visit, the vast majority of her blood sugars were at goal - we did not change her regimen. - At today's visit, sugars appear to be excellent, with many values in the 70s, and no hypoglycemia.  I did advise her to try to decrease her Lantus  dose but will continue the same dose of Jardiance . - I suggested to:  Patient Instructions  Please continue: - Jardiance  10 mg before b'fast   Try to decrease: - Lantus  18 units after dinner   Please return in 4 months with your sugar log.  - we checked her HbA1c: 6.2% (excellent, improved) - advised to check sugars at different times of the day - 1x a day, rotating check times - advised for yearly eye exams >> she is UTD - return to clinic in 4 months  2. Obesity class 2 - She has problems eating the right foods, since she also has to limit potassium - will continue Jardiance , which should also help with weight loss - She previously declined Ozempic due to side  effects but she ended up agreeing to try it, however, this was not covered for her.  The GLP-1 receptor agonist as previously suggested by cardiology. - She has difficulty exercising due to severe back pain.  I advised her to try to exercise her shoulder.  At that time and also do some chair exercises - As of now she is very active cleaning, emptying, and staging her house.  3.  Hyperlipidemia - Lipid panel was reviewed from 08/2023: LDL at goal, HDL slightly low: Lab Results  Component Value Date   CHOL 121 08/18/2023   HDL 31.10 (L) 08/18/2023   LDLCALC 69 08/18/2023   LDLDIRECT 55.0 05/15/2020   TRIG 108.0 08/18/2023   CHOLHDL 4 08/18/2023  - She continues Zocor  40 mg daily and Zetia  10 mg daily without side effects - will recheck her lipid panel today  4.  Amiodarone -induced hypothyroidism - She developed hypothyroidism while on amiodarone , but this resolved - Latest TFTs were normal Lab Results  Component Value Date   TSH 3.40 08/18/2023  - Will repeat a TSH today  Orders Placed This Encounter  Procedures   TSH   Lipid Panel w/reflex Direct LDL   POCT glycosylated hemoglobin (Hb A1C)   Lela Fendt, MD PhD [facility]

## 2024-09-05 ENCOUNTER — Ambulatory Visit: Payer: Self-pay | Admitting: Internal Medicine

## 2024-09-05 LAB — LIPID PANEL W/REFLEX DIRECT LDL
Cholesterol: 102 mg/dL (ref <200)
HDL: 36 mg/dL — ABNORMAL LOW (ref >=50)
LDL Cholesterol (Calc): 49 mg/dL
Non-HDL Cholesterol (Calc): 66 mg/dL (ref <130)
Total CHOL/HDL Ratio: 2.8 (calc) (ref <5.0)
Triglycerides: 85 mg/dL (ref <150)

## 2024-09-05 LAB — TSH: TSH: 2.18 m[IU]/L (ref 0.40–4.50)

## 2024-09-17 DIAGNOSIS — Z85828 Personal history of other malignant neoplasm of skin: Secondary | ICD-10-CM | POA: Diagnosis not present

## 2024-09-17 DIAGNOSIS — L570 Actinic keratosis: Secondary | ICD-10-CM | POA: Diagnosis not present

## 2024-09-17 DIAGNOSIS — D225 Melanocytic nevi of trunk: Secondary | ICD-10-CM | POA: Diagnosis not present

## 2024-09-17 DIAGNOSIS — L438 Other lichen planus: Secondary | ICD-10-CM | POA: Diagnosis not present

## 2024-09-17 DIAGNOSIS — L814 Other melanin hyperpigmentation: Secondary | ICD-10-CM | POA: Diagnosis not present

## 2024-09-17 DIAGNOSIS — L2089 Other atopic dermatitis: Secondary | ICD-10-CM | POA: Diagnosis not present

## 2024-09-17 DIAGNOSIS — D692 Other nonthrombocytopenic purpura: Secondary | ICD-10-CM | POA: Diagnosis not present

## 2024-09-17 DIAGNOSIS — D1801 Hemangioma of skin and subcutaneous tissue: Secondary | ICD-10-CM | POA: Diagnosis not present

## 2024-09-17 DIAGNOSIS — L821 Other seborrheic keratosis: Secondary | ICD-10-CM | POA: Diagnosis not present

## 2024-10-04 ENCOUNTER — Other Ambulatory Visit: Payer: Self-pay | Admitting: Internal Medicine

## 2024-10-04 DIAGNOSIS — L438 Other lichen planus: Secondary | ICD-10-CM | POA: Diagnosis not present

## 2024-10-04 DIAGNOSIS — L309 Dermatitis, unspecified: Secondary | ICD-10-CM | POA: Diagnosis not present

## 2024-10-04 DIAGNOSIS — Z85828 Personal history of other malignant neoplasm of skin: Secondary | ICD-10-CM | POA: Diagnosis not present

## 2024-10-04 DIAGNOSIS — L820 Inflamed seborrheic keratosis: Secondary | ICD-10-CM | POA: Diagnosis not present

## 2024-10-26 ENCOUNTER — Other Ambulatory Visit: Payer: Self-pay

## 2024-10-26 MED ORDER — EMPAGLIFLOZIN 10 MG PO TABS: 10.0000 mg | ORAL_TABLET | Freq: Every day | ORAL | 11 refills | Status: AC

## 2024-11-09 ENCOUNTER — Encounter: Payer: Self-pay | Admitting: Internal Medicine

## 2024-11-12 MED ORDER — ONETOUCH VERIO VI STRP: ORAL_STRIP | 12 refills | Status: AC

## 2024-11-16 ENCOUNTER — Other Ambulatory Visit: Payer: Self-pay | Admitting: Internal Medicine

## 2024-11-16 ENCOUNTER — Ambulatory Visit: Admitting: Internal Medicine

## 2024-11-16 ENCOUNTER — Other Ambulatory Visit: Payer: Self-pay | Admitting: Cardiovascular Disease

## 2024-11-27 ENCOUNTER — Ambulatory Visit: Admitting: Internal Medicine

## 2024-12-21 ENCOUNTER — Ambulatory Visit: Admitting: Internal Medicine

## 2025-01-02 ENCOUNTER — Ambulatory Visit: Admitting: Internal Medicine

## 2025-01-08 ENCOUNTER — Other Ambulatory Visit

## 2025-01-08 ENCOUNTER — Ambulatory Visit: Admitting: Hematology

## 2025-04-04 ENCOUNTER — Ambulatory Visit
